# Patient Record
Sex: Male | Born: 1950
Health system: Southern US, Community
[De-identification: ages and names within clinical notes are randomized; demographics above are authoritative.]

## PROBLEM LIST (undated history)

## (undated) DIAGNOSIS — F17200 Nicotine dependence, unspecified, uncomplicated: Secondary | ICD-10-CM

## (undated) DIAGNOSIS — J449 Chronic obstructive pulmonary disease, unspecified: Secondary | ICD-10-CM

## (undated) DIAGNOSIS — I2510 Atherosclerotic heart disease of native coronary artery without angina pectoris: Secondary | ICD-10-CM

## (undated) DIAGNOSIS — D126 Benign neoplasm of colon, unspecified: Secondary | ICD-10-CM

## (undated) DIAGNOSIS — I10 Essential (primary) hypertension: Secondary | ICD-10-CM

## (undated) DIAGNOSIS — K439 Ventral hernia without obstruction or gangrene: Secondary | ICD-10-CM

## (undated) DIAGNOSIS — E669 Obesity, unspecified: Secondary | ICD-10-CM

## (undated) DIAGNOSIS — E785 Hyperlipidemia, unspecified: Secondary | ICD-10-CM

## (undated) DIAGNOSIS — R0600 Dyspnea, unspecified: Secondary | ICD-10-CM

## (undated) DIAGNOSIS — C801 Malignant (primary) neoplasm, unspecified: Secondary | ICD-10-CM

## (undated) DIAGNOSIS — T8859XA Other complications of anesthesia, initial encounter: Secondary | ICD-10-CM

## (undated) DIAGNOSIS — K4090 Unilateral inguinal hernia, without obstruction or gangrene, not specified as recurrent: Secondary | ICD-10-CM

## (undated) DIAGNOSIS — Z8719 Personal history of other diseases of the digestive system: Secondary | ICD-10-CM

## (undated) DIAGNOSIS — T884XXA Failed or difficult intubation, initial encounter: Secondary | ICD-10-CM

## (undated) DIAGNOSIS — R7303 Prediabetes: Secondary | ICD-10-CM

## (undated) HISTORY — PX: EYE SURGERY: SHX253

## (undated) HISTORY — PX: FINGER SURGERY: SHX640

## (undated) HISTORY — PX: CARPAL TUNNEL RELEASE: SHX101

## (undated) HISTORY — DX: Hyperlipidemia, unspecified: E78.5

## (undated) HISTORY — DX: Nicotine dependence, unspecified, uncomplicated: F17.200

## (undated) HISTORY — PX: STRABISMUS SURGERY: SHX218

## (undated) HISTORY — DX: Personal history of other diseases of the digestive system: Z87.19

## (undated) HISTORY — DX: Benign neoplasm of colon, unspecified: D12.6

## (undated) HISTORY — DX: Obesity, unspecified: E66.9

---
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
## 2006-06-09 ENCOUNTER — Ambulatory Visit: Payer: Self-pay | Admitting: Cardiovascular Disease

## 2006-08-18 ENCOUNTER — Emergency Department (HOSPITAL_COMMUNITY): Admission: EM | Admit: 2006-08-18 | Discharge: 2006-08-18 | Payer: Self-pay | Admitting: Family Medicine

## 2006-08-22 ENCOUNTER — Encounter: Admission: RE | Admit: 2006-08-22 | Discharge: 2006-08-22 | Payer: Self-pay | Admitting: Family Medicine

## 2008-03-29 ENCOUNTER — Emergency Department (HOSPITAL_COMMUNITY): Admission: EM | Admit: 2008-03-29 | Discharge: 2008-03-29 | Payer: Self-pay | Admitting: Emergency Medicine

## 2008-06-09 ENCOUNTER — Emergency Department (HOSPITAL_COMMUNITY): Admission: EM | Admit: 2008-06-09 | Discharge: 2008-06-09 | Payer: Self-pay | Admitting: Family Medicine

## 2008-09-27 HISTORY — PX: COLONOSCOPY: SHX174

## 2009-05-12 ENCOUNTER — Emergency Department (HOSPITAL_COMMUNITY): Admission: EM | Admit: 2009-05-12 | Discharge: 2009-05-12 | Payer: Self-pay | Admitting: Family Medicine

## 2009-05-12 ENCOUNTER — Emergency Department (HOSPITAL_COMMUNITY): Admission: EM | Admit: 2009-05-12 | Discharge: 2009-05-12 | Payer: Self-pay | Admitting: Emergency Medicine

## 2009-05-28 DIAGNOSIS — Z8719 Personal history of other diseases of the digestive system: Secondary | ICD-10-CM

## 2009-05-28 HISTORY — DX: Personal history of other diseases of the digestive system: Z87.19

## 2011-01-02 LAB — URINALYSIS, ROUTINE W REFLEX MICROSCOPIC
Glucose, UA: NEGATIVE mg/dL
Ketones, ur: NEGATIVE mg/dL
Specific Gravity, Urine: 1.01 (ref 1.005–1.030)
Urobilinogen, UA: 1 mg/dL (ref 0.0–1.0)
pH: 6 (ref 5.0–8.0)

## 2011-01-02 LAB — DIFFERENTIAL
Basophils Absolute: 0.1 10*3/uL (ref 0.0–0.1)
Basophils Relative: 1 % (ref 0–1)
Eosinophils Relative: 2 % (ref 0–5)
Lymphs Abs: 2.1 10*3/uL (ref 0.7–4.0)
Monocytes Absolute: 1 10*3/uL (ref 0.1–1.0)
Neutro Abs: 8.9 10*3/uL — ABNORMAL HIGH (ref 1.7–7.7)
Neutrophils Relative %: 73 % (ref 43–77)

## 2011-01-02 LAB — COMPREHENSIVE METABOLIC PANEL
AST: 20 U/L (ref 0–37)
BUN: 7 mg/dL (ref 6–23)
CO2: 28 mEq/L (ref 19–32)
Calcium: 8.8 mg/dL (ref 8.4–10.5)
Creatinine, Ser: 0.9 mg/dL (ref 0.4–1.5)
Potassium: 4 mEq/L (ref 3.5–5.1)
Sodium: 137 mEq/L (ref 135–145)

## 2011-01-02 LAB — LIPASE, BLOOD: Lipase: 18 U/L (ref 11–59)

## 2011-01-02 LAB — CBC
MCHC: 34.2 g/dL (ref 30.0–36.0)
MCV: 95.7 fL (ref 78.0–100.0)
RBC: 4.91 MIL/uL (ref 4.22–5.81)
RDW: 13.9 % (ref 11.5–15.5)

## 2011-02-12 NOTE — Assessment & Plan Note (Signed)
[name]                              CARDIOLOGY OFFICE NOTE   Cameron [name]                           MRN:          [id number]  DATE:06/09/2006                            DOB:          08/31/1951    Cameron [name] is a 60 year old patient of Dr. Smith Mince.  I take care of his  wife.  He has multiple coronary risk factors, and has been having some  fatigue and exertional dyspnea.  He has been on cholesterol and hypertensive  medicines for 2 years.  There was also a question recently of diabetes.  The  patient has a positive family history of coronary disease, with several  relatives on the father's side having MIs at the age of 69.   The patient is fairly sedentary at home.  He does do a lot of activity at  work.   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   His exertional dyspnea does not particularly sound like an anginal  equivalent.  It sounds like he is out of shape and may have some early COPD.   He has gained some weight over the last couple of years as well, and this  may be reflected in his type 2 diabetes.   His previous surgery has been minimal.   He has had eye surgery in 1960 and 1961, and an appendix removed in 1972.   He does most of his smoking during the day, and smokes about a pack and a  half for the last 35 years.  He does not drink excessively.  He does have  caffeine on a daily basis.  He does not exercise outside of the activity of  work.   REVIEW OF SYSTEMS:  Benign.  He used to be seen at Urgent Care, and tells me  he has had EKGs back then.  His baseline EKG is abnormal, with nonspecific  ST-T wave changes in the inferior leads, and a nonpathological _____________  , and J point elevation in the lateral leads.   He is on:  1. Lisinopril 10 mg a day.  2. An aspirin a day.  3. Simvastatin 40 mg a day.   ALLERGIES:  He denies any allergies.   He is happily married to Cameron [name], who works at the hospital in  insurance.  I have known her for quite some time.  He has ___________  2  older daughters that are living with them, and seem to be doing well.   PHYSICAL EXAMINATION:  The weight is 240, blood pressure is 138/70, pulse is  70 and regular.  LUNGS:  Clear.  Carotids are normal.  There is an S1, S2, normal heart sounds.  ABDOMEN:  Benign.  LOWER EXTREMITIES:  Intact pulses, no edema.  He has a tattoo on his left  arm.   IMPRESSION:  The patient has multiple coronary risk factors, with some  exertional dyspnea.  I suspect this has more to do with  deconditioning and  his smoking.  However, I think given his multiple risk factors and abnormal  baseline EKG, he should have a stress Myoview.  We will arrange this later  this week.  As long as this is normal, I can see him on a yearly basis.  He  will continue to follow up with Dr. Smith Mince regarding multiple risk factor  modifications. I suspect he will need a glucose tolerance test on him, or a  hemoglobin A1C to further assess his diabetes.   Further recommendations will be based on the results of his Myoview.                               Cameron Pick. [name], MD, [facility]    PCN/MedQ  DD:  06/09/2006  DT:  06/10/2006  Job #:  [id number]

## 2011-06-30 LAB — DIFFERENTIAL
Basophils Absolute: 0.1
Basophils Relative: 0
Eosinophils Absolute: 0.1
Eosinophils Relative: 1
Lymphocytes Relative: 15
Lymphs Abs: 1.9

## 2011-06-30 LAB — POCT I-STAT, CHEM 8
BUN: 8
Chloride: 104
Potassium: 4

## 2011-06-30 LAB — CBC
RBC: 5.35
RDW: 14.2
WBC: 12.3 — ABNORMAL HIGH

## 2011-06-30 LAB — LIPASE, BLOOD: Lipase: 42

## 2011-06-30 LAB — POCT CARDIAC MARKERS
CKMB, poc: 1.8
Myoglobin, poc: 103
Troponin i, poc: <0.05

## 2011-06-30 LAB — HEPATIC FUNCTION PANEL
Bilirubin, Direct: 0.3
Indirect Bilirubin: 0.7

## 2011-12-30 ENCOUNTER — Emergency Department (INDEPENDENT_AMBULATORY_CARE_PROVIDER_SITE_OTHER)
Admission: EM | Admit: 2011-12-30 | Discharge: 2011-12-30 | Disposition: A | Discharge status: Home Health | Payer: BC Managed Care – PPO | Source: Home / Self Care | Attending: Family Medicine | Admitting: Family Medicine

## 2011-12-30 ENCOUNTER — Encounter (HOSPITAL_COMMUNITY): Payer: Self-pay

## 2011-12-30 DIAGNOSIS — R21 Rash and other nonspecific skin eruption: Secondary | ICD-10-CM

## 2011-12-30 HISTORY — DX: Essential (primary) hypertension: I10

## 2011-12-30 MED ORDER — PREDNISONE (PAK) 10 MG PO TABS: 10.0000 mg | ORAL_TABLET | Freq: Every day | ORAL | Status: AC

## 2011-12-30 MED ORDER — AMLODIPINE BESY-BENAZEPRIL HCL 10-20 MG PO CAPS: 1.0000 | ORAL_CAPSULE | Freq: Every day | ORAL | Status: DC

## 2011-12-30 NOTE — ED Notes (Signed)
Pt c/o generalized rash.  Mainly concentrated on Lower Legs.  Pt states he does have some on bilateral arms as well and on R abdomen.  Pt TX with rubbing alcohol at home with no relief.

## 2011-12-30 NOTE — Discharge Instructions (Signed)
No hot bathing. Cool as comfortable. Apply moisturizing lotion

## 2011-12-30 NOTE — ED Provider Notes (Signed)
History     CSN: [id number]  Arrival date & time 12/30/11  4098   First MD Initiated Contact with Patient 12/30/11 [phone]      Chief Complaint  Patient presents with  . Rash    (Consider location/radiation/quality/duration/timing/severity/associated sxs/prior treatment) HPI Comments: The patient reports having a rash on his legs below the knees for approx 2 wks. Itches. States he applied alcohol with not relief. No other family members with rash.   The history is provided by the patient.    Past Medical History  Diagnosis Date  . Hypertension     Past Surgical History  Procedure Date  . Eye surgery   . Appendectomy     History reviewed. No pertinent family history.  History  Substance Use Topics  . Smoking status: Current Everyday Smoker  . Smokeless tobacco: Not on file  . Alcohol Use: Yes      Review of Systems  HENT: Negative.   Eyes: Negative.   Respiratory: Negative.   Cardiovascular: Negative.   Genitourinary: Negative.   Musculoskeletal: Negative.     Allergies  Review of patient's allergies indicates no known allergies.  Home Medications   Current Outpatient Rx  Name Route Sig Dispense Refill  . ASPIRIN 81 MG PO TABS Oral Take 81 mg by mouth daily.    . OMEGA-3 FATTY ACIDS 1000 MG PO CAPS Oral Take 2 g by mouth daily.    Marland Kitchen AMLODIPINE BESY-BENAZEPRIL HCL 10-20 MG PO CAPS Oral Take 1 capsule by mouth daily.    Marland Kitchen PREDNISONE (PAK) 10 MG PO TABS Oral Take 1 tablet (10 mg total) by mouth daily. Dispense one 6 day taper Take as directed with food 21 tablet 0    BP 173/89  Pulse 78  Temp(Src) 97.8 F (36.6 C) (Oral)  Resp 14  SpO2 96%  Physical Exam  Nursing note and vitals reviewed. Constitutional: He appears well-developed and well-nourished. No distress.       States he has not had his bp med in over 3 months.   HENT:  Head: Normocephalic and atraumatic.  Nose: Nose normal.  Mouth/Throat: Oropharynx is clear and moist.  Neck: Normal  range of motion. Neck supple.  Cardiovascular: Normal rate and regular rhythm.   Pulmonary/Chest: Effort normal.  Musculoskeletal: He exhibits no edema.  Skin:       Multiple scabbed lesions on lower legs . No evidence of secondary infection. Rash most consistent with contact dermatitis    ED Course  Procedures (including critical care time)  Labs Reviewed - No data to display No results found.   1. Rash       MDM          Randa Spike, MD 12/30/11 [phone]

## 2012-02-11 ENCOUNTER — Encounter: Payer: Self-pay | Admitting: Family Medicine

## 2012-02-11 ENCOUNTER — Ambulatory Visit (INDEPENDENT_AMBULATORY_CARE_PROVIDER_SITE_OTHER): Payer: BC Managed Care – PPO | Admitting: Family Medicine

## 2012-02-11 VITALS — BP 135/80 | HR 93 | Temp 98.5°F | Wt 252.0 lb

## 2012-02-11 DIAGNOSIS — L259 Unspecified contact dermatitis, unspecified cause: Secondary | ICD-10-CM

## 2012-02-11 DIAGNOSIS — Z23 Encounter for immunization: Secondary | ICD-10-CM

## 2012-02-11 DIAGNOSIS — E785 Hyperlipidemia, unspecified: Secondary | ICD-10-CM

## 2012-02-11 DIAGNOSIS — I10 Essential (primary) hypertension: Secondary | ICD-10-CM

## 2012-02-11 DIAGNOSIS — B354 Tinea corporis: Secondary | ICD-10-CM

## 2012-02-11 MED ORDER — AMLODIPINE BESY-BENAZEPRIL HCL 10-20 MG PO CAPS: 1.0000 | ORAL_CAPSULE | Freq: Every day | ORAL | Status: DC

## 2012-02-11 MED ORDER — FLUTICASONE PROPIONATE 0.05 % EX CREA: TOPICAL_CREAM | Freq: Two times a day (BID) | CUTANEOUS | Status: DC

## 2012-02-11 MED ORDER — PREDNISONE 20 MG PO TABS: ORAL_TABLET | ORAL | Status: DC

## 2012-02-11 NOTE — Progress Notes (Signed)
Office Note 02/11/2012  CC:  Chief Complaint  Patient presents with  . Establish Care    rash on legs, previously seen in UC    HPI:  Cameron [name] is a 61 y.o. White male who is here to establish care and discuss rash on legs. Patient's most recent primary MD: Dr. Raquel James in [place]--shut down 2 yrs ago. Old records were reviewed in [facility] during today's visit.  Had onset of itchy rash about 6 wks ago on lower legs.  No trigger or contact irritant or allergen identified. He went to [facility] urgent care and was rx'd oral steroid taper x 6d and advised to use OTC hydrocortisone cream.  The rash and itching improved but didn't go away with this tx, then gradually the rash and itching have come back.  Also notes a non-itchy oval patch of rash on right anterior/lateral thigh lately x 2 wks or so. Still using hydrocortisone.  Past Medical History  Diagnosis Date  . Hypertension   . Hyperlipidemia     lovaza from prior PMD-stopped due to cost    Past Surgical History  Procedure Date  . Eye surgery   . Appendectomy 1973  . Strabismus surgery age 89 or 32    Family History  Problem Relation Age of Onset  . Heart disease Mother   . COPD Father   . Alcohol abuse Maternal Grandfather     History   Social History  . Marital Status: Married    Spouse Name: N/A    Number of Children: N/A  . Years of Education: N/A   Occupational History  . Not on file.   Social History Main Topics  . Smoking status: Current Everyday Smoker  . Smokeless tobacco: Never Used  . Alcohol Use: Yes     rare  . Drug Use: No  . Sexually Active: Not on file   Other Topics Concern  . 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  No drug use except DISTANT use of marijuana.    MEDS: ASA 81 mg qd, Lotrel 10/20 qd, OTC  fish oil tab (2 qd), OTC hydrocortisone cream prn  No Known Allergies  ROS Review of Systems  Constitutional: Negative for fever and fatigue.  HENT: Negative for congestion and sore throat.   Eyes: Negative for visual disturbance.  Respiratory: Negative for cough.   Cardiovascular: Negative for chest pain.  Gastrointestinal: Negative for nausea and abdominal pain.  Genitourinary: Negative for dysuria.  Musculoskeletal: Negative for back pain and joint swelling.  Skin: Positive for rash (as per HPI).  Neurological: Negative for weakness and headaches.  Hematological: Negative for adenopathy.    PE; Blood pressure 135/80, pulse 93, temperature 98.5 F (36.9 C), temperature source Temporal, weight 252 lb (114.306 kg), SpO2 96.00%. Gen: Alert, well appearing.  Patient is oriented to person, place, time, and situation. ENT: Eyes: no injection, icteris, swelling, or exudate.  EOMI, PERRLA. Nose: no drainage or turbinate edema/swelling.  No injection or focal lesion.  Mouth: lips without lesion/swelling.  Oral mucosa pink and moist.  Oropharynx without erythema, exudate, or swelling.  Neck - No masses or thyromegaly or limitation in range of motion CV: RRR, no m/r/g.   LUNGS: CTA bilat, nonlabored resps, good aeration in all lung fields. ABD: soft, NT, ND, BS normal.  No  hepatospenomegaly or mass.  No bruits. EXT: no clubbing, cyanosis, or edema.  SKIN: from knees down to ankles bilaterally there are scattered pinkish papules with excoriations, mild erythematous base to the papule areas.  No vesicles, no pustules, no streaking, no warmth, no tenderness.  Anterior surfaces affected more than posterior. Right anterolateral thigh with 2-3 cm irregular shaped/oval plaque with flaking/hyperkeratosis, with similar smaller lesion just above it.  Mild TTP when I press hard on this.   No fluctuance beneath this.    Pertinent labs:  None today  ASSESSMENT AND PLAN:   New pt: obtain old  records.  Contact dermatitis Unknown etiology. I'll do longer steroid taper: 40mg  qd x 5d, then 20mg  qd x 5d, then 10mg  qd x 6d, then stop. Will also give stronger steroid cream: cutivate 0.05% generic, use bid sparingly.   Tinea corporis I believe his right thigh lesions are tinea, and not related to his lower legs dermatitis. I recommended OTC lamisil bid and I'll recheck this when i see him for CPE in about 3 wks.  HTN (hypertension), benign BP good today. RF'd med today.     Return in about 3 weeks (around 03/03/2012) for o/v in 3 wks for CPE, needs fasting labs the week prior (orders are in).

## 2012-02-11 NOTE — Patient Instructions (Signed)
Buy OTC lamisil cream (generic is fine) and apply twice daily for several weeks or until the spot on your thigh is gone for 3 days.

## 2012-02-11 NOTE — Assessment & Plan Note (Signed)
I believe his right thigh lesions are tinea, and not related to his lower legs dermatitis. I recommended OTC lamisil bid and I'll recheck this when i see him for CPE in about 3 wks.

## 2012-02-11 NOTE — Assessment & Plan Note (Signed)
BP good today. RF'd med today.

## 2012-02-11 NOTE — Assessment & Plan Note (Signed)
Unknown etiology. I'll do longer steroid taper: 40mg  qd x 5d, then 20mg  qd x 5d, then 10mg  qd x 6d, then stop. Will also give stronger steroid cream: cutivate 0.05% generic, use bid sparingly.

## 2012-03-03 ENCOUNTER — Other Ambulatory Visit (INDEPENDENT_AMBULATORY_CARE_PROVIDER_SITE_OTHER): Payer: BC Managed Care – PPO

## 2012-03-03 ENCOUNTER — Other Ambulatory Visit: Payer: Self-pay | Admitting: Family Medicine

## 2012-03-03 DIAGNOSIS — E785 Hyperlipidemia, unspecified: Secondary | ICD-10-CM

## 2012-03-03 DIAGNOSIS — I10 Essential (primary) hypertension: Secondary | ICD-10-CM

## 2012-03-03 DIAGNOSIS — L259 Unspecified contact dermatitis, unspecified cause: Secondary | ICD-10-CM

## 2012-03-03 DIAGNOSIS — B354 Tinea corporis: Secondary | ICD-10-CM

## 2012-03-03 LAB — COMPREHENSIVE METABOLIC PANEL
Albumin: 3.7 g/dL (ref 3.5–5.2)
CO2: 29 mEq/L (ref 19–32)
Calcium: 8.7 mg/dL (ref 8.4–10.5)
Chloride: 104 mEq/L (ref 96–112)
Creatinine, Ser: 0.9 mg/dL (ref 0.4–1.5)
Glucose, Bld: 97 mg/dL (ref 70–99)
Potassium: 4.2 mEq/L (ref 3.5–5.1)
Total Protein: 6.5 g/dL (ref 6.0–8.3)

## 2012-03-03 LAB — LIPID PANEL
Total CHOL/HDL Ratio: 6
Triglycerides: 175 mg/dL — ABNORMAL HIGH (ref 0.0–149.0)

## 2012-03-03 LAB — CBC WITH DIFFERENTIAL/PLATELET
Basophils Relative: 0.2 % (ref 0.0–3.0)
Eosinophils Absolute: 0.1 10*3/uL (ref 0.0–0.7)
Lymphocytes Relative: 16.7 % (ref 12.0–46.0)
Lymphs Abs: 1.9 10*3/uL (ref 0.7–4.0)
MCV: 94.5 fl (ref 78.0–100.0)
Monocytes Relative: 5.2 % (ref 3.0–12.0)
Neutrophils Relative %: 76.7 % (ref 43.0–77.0)
RDW: 14.9 % — ABNORMAL HIGH (ref 11.5–14.6)
WBC: 11.1 10*3/uL — ABNORMAL HIGH (ref 4.5–10.5)

## 2012-03-03 MED ORDER — AMLODIPINE BESY-BENAZEPRIL HCL 10-20 MG PO CAPS: 1.0000 | ORAL_CAPSULE | Freq: Every day | ORAL | Status: DC

## 2012-03-03 NOTE — Telephone Encounter (Signed)
RX sent to pharmacy  

## 2012-03-10 ENCOUNTER — Encounter: Payer: BC Managed Care – PPO | Admitting: Family Medicine

## 2012-03-13 ENCOUNTER — Encounter: Payer: Self-pay | Admitting: Family Medicine

## 2012-03-13 ENCOUNTER — Ambulatory Visit (INDEPENDENT_AMBULATORY_CARE_PROVIDER_SITE_OTHER): Payer: BC Managed Care – PPO | Admitting: Family Medicine

## 2012-03-13 VITALS — BP 128/83 | HR 76 | Temp 98.0°F | Ht 70.0 in | Wt 254.0 lb

## 2012-03-13 DIAGNOSIS — I10 Essential (primary) hypertension: Secondary | ICD-10-CM

## 2012-03-13 DIAGNOSIS — Z125 Encounter for screening for malignant neoplasm of prostate: Secondary | ICD-10-CM

## 2012-03-13 DIAGNOSIS — Z0000 Encounter for general adult medical examination without abnormal findings: Secondary | ICD-10-CM

## 2012-03-13 DIAGNOSIS — E782 Mixed hyperlipidemia: Secondary | ICD-10-CM

## 2012-03-13 LAB — PSA: PSA: 1.28 ng/mL (ref 0.10–4.00)

## 2012-03-13 MED ORDER — ATORVASTATIN CALCIUM 10 MG PO TABS: 10.0000 mg | ORAL_TABLET | Freq: Every day | ORAL | Status: DC

## 2012-03-13 NOTE — Assessment & Plan Note (Addendum)
Reviewed lipid panel and discussed goals, also discussed some specific dietary changes he can make and discussed minimum exercise recommendations (20-30 min 5/7 days per week.  Lab Results  Component Value Date   CHOL 225* 03/03/2012   HDL 37.60* 03/03/2012   LDLDIRECT 159.2 03/03/2012   TRIG 175.0* 03/03/2012   CHOLHDL 6 03/03/2012   Goal LDL <409, goal trigs <150, goal HDL >40. Start atorvastatin 10mg  qd.  Recheck lipids/transaminases in 3 months.

## 2012-03-13 NOTE — Progress Notes (Signed)
Office Note 03/13/2012  CC:  Chief Complaint  Patient presents with  . Annual Exam    discuss CPE labs    HPI:  Cameron [name] is a 61 y.o. White male who is here for CPE. He has no acute complaints but would like his skin tags on his eyelids removed, referral to derm if necessary.  He does not exercise at all. He still smokes.  He admits to eating a poor diet.   Past Medical History  Diagnosis Date  . Hypertension   . Hyperlipidemia     lovaza from prior PMD-stopped due to cost  . Tobacco dependence     Past Surgical History  Procedure Date  . Eye surgery   . Appendectomy 1973  . Strabismus surgery age 52 or 67    Family History  Problem Relation Age of Onset  . Heart disease Mother   . COPD Father   . Alcohol abuse Maternal Grandfather     History   Social History  . Marital Status: Married    Spouse Name: N/A    Number of Children: N/A  . Years of Education: N/A   Occupational History  . Not on file.   Social History Main Topics  . Smoking status: Current Everyday Smoker  . Smokeless tobacco: Never Used  . Alcohol Use: Yes     rare  . Drug Use: No  . Sexually Active: Not on file   Other Topics Concern  . 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  No drug use except DISTANT use of marijuana.   MEDS: Lotrel 10/20 qd, fish oil OTC 2g daily, ASA 81mg  qd, Flonase qd  No Known Allergies  ROS Review of Systems  Constitutional: Negative for fever, chills, appetite change and fatigue.  HENT: Negative for ear pain, congestion, sore throat, neck stiffness and dental problem.   Eyes: Negative for discharge, redness and visual disturbance.       Skin tags on eyelids and under eyes for years  Respiratory: Negative for cough, chest tightness, shortness of breath and  wheezing.   Cardiovascular: Negative for chest pain, palpitations and leg swelling.  Gastrointestinal: Negative for nausea, vomiting, abdominal pain, diarrhea and blood in stool.  Genitourinary: Negative for dysuria, urgency, frequency, hematuria, flank pain and difficulty urinating.  Musculoskeletal: Negative for myalgias, back pain, joint swelling and arthralgias.  Skin: Negative for pallor and rash.  Neurological: Negative for dizziness, speech difficulty, weakness and headaches.  Hematological: Negative for adenopathy. Does not bruise/bleed easily.  Psychiatric/Behavioral: Negative for confusion and disturbed wake/sleep cycle. The patient is not nervous/anxious.     PE; Blood pressure 128/83, pulse 76, temperature 98 F (36.7 C), temperature source Temporal, height 5\' 10"  (1.778 m), weight 254 lb (115.214 kg), SpO2 96.00%. Gen: Alert, well appearing.  Patient is oriented to person, place, time, and situation. PSYCH: affect is pleasant, lucid thought and speech. ENT: Ears: EACs clear, normal epithelium.  TMs with good light reflex and landmarks bilaterally.  Eyes: no injection, icteris, swelling, or exudate.  EOMI, PERRLA.  Upper eye lids with a few pedunculated skin tags, with a few more on the infraorbital region under each eye. Nose: no drainage or turbinate edema/swelling.  No injection or focal lesion.  Mouth: lips without lesion/swelling.  Oral mucosa pink and moist.  Dentition  intact and with discoloration diffusely, several teeth have recently been extracted.  Oropharynx with mild diffuse erythema--no exudate or swelling. Neck: supple/nontender.  No LAD, mass, or TM.  Carotid pulses 2+ bilaterally, without bruits. CV: RRR, no m/r/g.   LUNGS: CTA bilat, nonlabored resps, good aeration in all lung fields. ABD: soft, NT, rotund but not distended. BS normal.  No hepatospenomegaly or mass.  No bruits. EXT: no clubbing or cyanosis.  Trace bilat edema from mid tibia level down to ankles.   DP and PT pulses 2+ bilat.   Neuro: CN 2-12 intact bilaterally, strength 5/5 in proximal and distal upper extremities and lower extremities bilaterally.  No sensory deficits.  No tremor.  No disdiadochokinesis.  No ataxia.  Upper extremity and lower extremity DTRs symmetric.  No pronator drift. Genitals normal; both testes normal without tenderness, masses, hydroceles, varicoceles, erythema or swelling. Shaft normal, circumcised, meatus normal without discharge. No inguinal hernia noted. No inguinal lymphadenopathy. Rectal exam: negative without mass, lesions or tenderness.  Prostate does not feel enlarged.  No nodule, induration, or tenderness.    Pertinent labs:  Lab Results  Component Value Date   TSH 1.08 03/03/2012   Lab Results  Component Value Date   WBC 11.1* 03/03/2012   HGB 15.9 03/03/2012   HCT 48.1 03/03/2012   MCV 94.5 03/03/2012   PLT 222.0 03/03/2012   Lab Results  Component Value Date   CREATININE 0.9 03/03/2012   BUN 9 03/03/2012   NA 138 03/03/2012   K 4.2 03/03/2012   CL 104 03/03/2012   CO2 29 03/03/2012   Lab Results  Component Value Date   ALT 13 03/03/2012   AST 12 03/03/2012   ALKPHOS 80 03/03/2012   BILITOT 0.5 03/03/2012   Lab Results  Component Value Date   CHOL 225* 03/03/2012   Lab Results  Component Value Date   HDL 37.60* 03/03/2012   No results found for this basename: LDLCALC   Lab Results  Component Value Date   TRIG 175.0* 03/03/2012   Lab Results  Component Value Date   CHOLHDL 6 03/03/2012   No results found for this basename: PSA    ASSESSMENT AND PLAN:   Health maintenance examination Reviewed age and gender appropriate health maintenance issues (prudent diet, regular exercise, health risks of tobacco and excessive alcohol, use of seatbelts, fire alarms in home, use of sunscreen).  Also reviewed age and gender appropriate health screening as well as vaccine recommendations. He is UTD on vaccines and on colon cancer screening (need to obtain [facility] GI  records). DRE normal today, will get PSA drawn today for prostate cancer screening. Encouraged smoking cessation. Discussed diet and exercise.  Hyperlipemia, mixed Reviewed lipid panel and discussed goals, also discussed some specific dietary changes he can make and discussed minimum exercise recommendations (20-30 min 5/7 days per week.  Lab Results  Component Value Date   CHOL 225* 03/03/2012   HDL 37.60* 03/03/2012   LDLDIRECT 159.2 03/03/2012   TRIG 175.0* 03/03/2012   CHOLHDL 6 03/03/2012   Goal LDL <161, goal trigs <150, goal HDL >40. Start atorvastatin 10mg  qd.  Recheck lipids/transaminases in 3 months.    FOLLOW UP:  Return in about 3 months (around 06/13/2012) for f/u hyperlipidemia and remove skin tags (30 min appt).

## 2012-03-13 NOTE — Assessment & Plan Note (Signed)
Reviewed age and gender appropriate health maintenance issues (prudent diet, regular exercise, health risks of tobacco and excessive alcohol, use of seatbelts, fire alarms in home, use of sunscreen).  Also reviewed age and gender appropriate health screening as well as vaccine recommendations. He is UTD on vaccines and on colon cancer screening (need to obtain [facility] GI records). DRE normal today, will get PSA drawn today for prostate cancer screening. Encouraged smoking cessation. Discussed diet and exercise.

## 2012-03-27 ENCOUNTER — Encounter: Payer: Self-pay | Admitting: Family Medicine

## 2012-04-20 ENCOUNTER — Other Ambulatory Visit: Payer: Self-pay | Admitting: *Deleted

## 2012-04-20 MED ORDER — AMLODIPINE BESY-BENAZEPRIL HCL 10-20 MG PO CAPS: 1.0000 | ORAL_CAPSULE | Freq: Every day | ORAL | Status: DC

## 2012-04-20 MED ORDER — ATORVASTATIN CALCIUM 10 MG PO TABS: 10.0000 mg | ORAL_TABLET | Freq: Every day | ORAL | Status: DC

## 2012-04-20 NOTE — Telephone Encounter (Signed)
Pt call requesting 90 day supply of Lotrel and lipitor.  RX sent.   Pt has follow up on 06/16/12.

## 2012-04-25 ENCOUNTER — Ambulatory Visit: Payer: BC Managed Care – PPO | Admitting: Family Medicine

## 2012-06-05 ENCOUNTER — Telehealth: Payer: Self-pay | Admitting: Family Medicine

## 2012-06-05 NOTE — Telephone Encounter (Signed)
Pt notified that report is at front desk.  Advised that TDAP is the only vaccine we have a record of.  He states this is correct and he will get other immunizations if needed from previous practices.

## 2012-06-16 ENCOUNTER — Ambulatory Visit: Payer: BC Managed Care – PPO | Admitting: Family Medicine

## 2012-08-02 ENCOUNTER — Other Ambulatory Visit: Payer: Self-pay | Admitting: Family Medicine

## 2012-08-02 NOTE — Telephone Encounter (Signed)
Patient would like his Lipitor and Lotrel sent in to [facility] ([address])

## 2012-08-03 MED ORDER — ATORVASTATIN CALCIUM 10 MG PO TABS: 10.0000 mg | ORAL_TABLET | Freq: Every day | ORAL | Status: DC

## 2012-08-03 MED ORDER — AMLODIPINE BESY-BENAZEPRIL HCL 10-20 MG PO CAPS: 1.0000 | ORAL_CAPSULE | Freq: Every day | ORAL | Status: DC

## 2012-08-03 NOTE — Telephone Encounter (Signed)
Refill request for LOTREL, LIPITOR Last filled- 04/20/12, #90 X 1 sent to [facility] Last seen-03/13/12 Follow up - 3 months, no appts made 90 day supply sent per [facility] protocol

## 2012-12-11 ENCOUNTER — Other Ambulatory Visit: Payer: Self-pay | Admitting: Family Medicine

## 2012-12-11 NOTE — Telephone Encounter (Signed)
eScribe request for refill on ATORVASTATIN Last filled - 05/2012, #90 X 0 Last seen on - 03/13/12 Follow up - 3 MONTHS 30 DAY RX sent.  Pt wife notified [facility] to fill for 30 day supply, but he is due for follow up for more refills.  She will give him message.

## 2012-12-29 ENCOUNTER — Ambulatory Visit (INDEPENDENT_AMBULATORY_CARE_PROVIDER_SITE_OTHER): Payer: 59 | Admitting: Family Medicine

## 2012-12-29 ENCOUNTER — Encounter: Payer: Self-pay | Admitting: Family Medicine

## 2012-12-29 VITALS — BP 130/74 | HR 76 | Ht 70.0 in | Wt 247.0 lb

## 2012-12-29 DIAGNOSIS — E782 Mixed hyperlipidemia: Secondary | ICD-10-CM

## 2012-12-29 DIAGNOSIS — I10 Essential (primary) hypertension: Secondary | ICD-10-CM

## 2012-12-29 DIAGNOSIS — F17200 Nicotine dependence, unspecified, uncomplicated: Secondary | ICD-10-CM

## 2012-12-29 NOTE — Assessment & Plan Note (Signed)
Problem stable.  Continue current medications and diet appropriate for this condition.  We have reviewed our general long term plan for this problem and also reviewed symptoms and signs that should prompt the patient to call or return to the office. Check lytes/cr with upcoming fasting labs.

## 2012-12-29 NOTE — Assessment & Plan Note (Signed)
He is comfortable with cutting back but has not come to the point of saying he'll quit cold [place]. Encouraged him to quit completely.

## 2012-12-29 NOTE — Progress Notes (Signed)
OFFICE NOTE  12/29/2012  CC:  Chief Complaint  Patient presents with  . Follow-up    cholesterol, HTN     HPI: Patient is a 62 y.o. Caucasian male who is here for f/u HTN, Hyperlipidemia, tob dependence.   Doing well, tolerating atorvastatin fine.  Needs f/u lipid panel. Rare bp check at pharmacy is normal. He continues to smoke but has cut back quite a bit--1 pack lasts 2d.  Tried nicotine patches/gum recently with a quit program through [place] but says it didn't work.  Tried chantix in distant past and it caused bad dreams.  He got layed off from [employer] and now works as an Medical laboratory scientific officer at [employer]. He has been eating a better diet since this job switch.  Pertinent PMH:  Past Medical History  Diagnosis Date  . Hypertension   . Hyperlipidemia     lovaza from prior PMD-stopped due to cost  . Tobacco dependence   . Adenomatous colon polyp 05/2009    Tubular adenoma, no high grade dysplasia.Marland Kitchen  + Hyperplastic polyps.  . H/O chronic gastritis 05/2009    EGD: no h.pylori,dysplasia,or evidence of malignancy   Past Surgical History  Procedure Laterality Date  . Eye surgery    . Appendectomy  1973  . Strabismus surgery  age 52 or 9    MEDS:  Outpatient Prescriptions Prior to Visit  Medication Sig Dispense Refill  . amLODipine-benazepril (LOTREL) 10-20 MG per capsule Take 1 capsule by mouth daily.  90 capsule  0  . aspirin 81 MG tablet Take 81 mg by mouth daily.      Marland Kitchen atorvastatin (LIPITOR) 10 MG tablet TAKE 1 TABLET BY MOUTH DAILY.  30 tablet  0  . fish oil-omega-3 fatty acids 1000 MG capsule Take 2 g by mouth daily.      . fluticasone (CUTIVATE) 0.05 % cream Apply topically 2 (two) times daily.  30 g  0   No facility-administered medications prior to visit.    PE: Blood pressure 130/74, pulse 76, height 5\' 10"  (1.778 m), weight 247 lb (112.038 kg). Gen: Alert, well appearing.  Patient is oriented to person, place, time, and situation. CV: RRR, no m/r/g.    LUNGS: CTA bilat, nonlabored resps, good aeration in all lung fields. EXT: no cyanosis or clubbing.  1+ pitting edema bilaterally.  IMPRESSION AND PLAN:  HTN (hypertension), benign Problem stable.  Continue current medications and diet appropriate for this condition.  We have reviewed our general long term plan for this problem and also reviewed symptoms and signs that should prompt the patient to call or return to the office. Check lytes/cr with upcoming fasting labs.  Hyperlipemia, mixed Tolerating atorvastatin fine. Needs FLP w/transaminases ASAP--ordered.  Tobacco dependence He is comfortable with cutting back but has not come to the point of saying he'll quit cold [place]. Encouraged him to quit completely.  An After Visit Summary was printed and given to the patient.  FOLLOW UP: 4-6 mo for CPE

## 2012-12-29 NOTE — Assessment & Plan Note (Signed)
Tolerating atorvastatin fine. Needs FLP w/transaminases ASAP--ordered.

## 2013-01-04 ENCOUNTER — Other Ambulatory Visit (INDEPENDENT_AMBULATORY_CARE_PROVIDER_SITE_OTHER): Payer: 59

## 2013-01-04 DIAGNOSIS — R7309 Other abnormal glucose: Secondary | ICD-10-CM

## 2013-01-04 DIAGNOSIS — I10 Essential (primary) hypertension: Secondary | ICD-10-CM

## 2013-01-04 DIAGNOSIS — E782 Mixed hyperlipidemia: Secondary | ICD-10-CM

## 2013-01-04 DIAGNOSIS — F17200 Nicotine dependence, unspecified, uncomplicated: Secondary | ICD-10-CM

## 2013-01-04 DIAGNOSIS — R739 Hyperglycemia, unspecified: Secondary | ICD-10-CM

## 2013-01-04 LAB — ALT: ALT: 13 U/L (ref 0–53)

## 2013-01-04 LAB — BASIC METABOLIC PANEL
CO2: 27 mEq/L (ref 19–32)
Calcium: 8.7 mg/dL (ref 8.4–10.5)
GFR: 86 mL/min (ref >60.00)
Potassium: 4.2 mEq/L (ref 3.5–5.1)
Sodium: 138 mEq/L (ref 135–145)

## 2013-01-04 LAB — LIPID PANEL
Cholesterol: 142 mg/dL (ref 0–200)
LDL Cholesterol: 90 mg/dL (ref 0–99)
Total CHOL/HDL Ratio: 5
Triglycerides: 115 mg/dL (ref 0.0–149.0)

## 2013-01-04 NOTE — Progress Notes (Signed)
Labs only

## 2013-01-05 NOTE — Addendum Note (Signed)
Addended by: Baldemar Lenis R on: 01/05/2013 08:05 AM   Modules accepted: Orders

## 2013-01-07 ENCOUNTER — Encounter: Payer: Self-pay | Admitting: Family Medicine

## 2013-01-07 DIAGNOSIS — R7301 Impaired fasting glucose: Secondary | ICD-10-CM | POA: Insufficient documentation

## 2013-01-09 ENCOUNTER — Telehealth: Payer: Self-pay | Admitting: *Deleted

## 2013-01-09 NOTE — Telephone Encounter (Signed)
Wife was checking on rx for amlodpine. Wife called back to let us know that rx has already been filled by pharmacy.

## 2013-02-20 ENCOUNTER — Other Ambulatory Visit: Payer: Self-pay | Admitting: Family Medicine

## 2013-02-20 NOTE — Telephone Encounter (Signed)
Rx request to pharmacy/SLS  

## 2013-04-16 ENCOUNTER — Other Ambulatory Visit: Payer: Self-pay | Admitting: Family Medicine

## 2013-06-08 ENCOUNTER — Encounter: Payer: 59 | Admitting: Family Medicine

## 2013-08-28 ENCOUNTER — Other Ambulatory Visit: Payer: Self-pay | Admitting: Family Medicine

## 2014-03-14 ENCOUNTER — Other Ambulatory Visit: Payer: Self-pay | Admitting: Family Medicine

## 2014-04-23 ENCOUNTER — Encounter: Payer: Self-pay | Admitting: Family Medicine

## 2014-04-23 ENCOUNTER — Ambulatory Visit (INDEPENDENT_AMBULATORY_CARE_PROVIDER_SITE_OTHER): Payer: 59 | Admitting: Family Medicine

## 2014-04-23 VITALS — BP 148/89 | HR 73 | Temp 98.5°F | Resp 18 | Ht 70.0 in | Wt 241.0 lb

## 2014-04-23 DIAGNOSIS — L918 Other hypertrophic disorders of the skin: Secondary | ICD-10-CM

## 2014-04-23 DIAGNOSIS — Z0389 Encounter for observation for other suspected diseases and conditions ruled out: Secondary | ICD-10-CM

## 2014-04-23 DIAGNOSIS — L909 Atrophic disorder of skin, unspecified: Secondary | ICD-10-CM

## 2014-04-23 DIAGNOSIS — E669 Obesity, unspecified: Secondary | ICD-10-CM

## 2014-04-23 DIAGNOSIS — E663 Overweight: Secondary | ICD-10-CM | POA: Insufficient documentation

## 2014-04-23 DIAGNOSIS — L919 Hypertrophic disorder of the skin, unspecified: Secondary | ICD-10-CM

## 2014-04-23 DIAGNOSIS — Z0000 Encounter for general adult medical examination without abnormal findings: Secondary | ICD-10-CM

## 2014-04-23 DIAGNOSIS — E66811 Obesity, class 1: Secondary | ICD-10-CM

## 2014-04-23 HISTORY — DX: Obesity, class 1: E66.811

## 2014-04-23 HISTORY — DX: Obesity, unspecified: E66.9

## 2014-04-23 LAB — PSA: PSA: 0.94 ng/mL (ref 0.10–4.00)

## 2014-04-23 LAB — COMPREHENSIVE METABOLIC PANEL WITH GFR
ALT: 15 U/L (ref 0–53)
AST: 19 U/L (ref 0–37)
Albumin: 4.1 g/dL (ref 3.5–5.2)
Alkaline Phosphatase: 88 U/L (ref 39–117)
BUN: 9 mg/dL (ref 6–23)
CO2: 30 meq/L (ref 19–32)
Calcium: 9.3 mg/dL (ref 8.4–10.5)
Chloride: 102 meq/L (ref 96–112)
Creatinine, Ser: 1 mg/dL (ref 0.4–1.5)
GFR: 85.63 mL/min
Glucose, Bld: 98 mg/dL (ref 70–99)
Potassium: 4 meq/L (ref 3.5–5.1)
Sodium: 139 meq/L (ref 135–145)
Total Bilirubin: 0.7 mg/dL (ref 0.2–1.2)
Total Protein: 6.8 g/dL (ref 6.0–8.3)

## 2014-04-23 LAB — CBC WITH DIFFERENTIAL/PLATELET
BASOS ABS: 0 10*3/uL (ref 0.0–0.1)
Basophils Relative: 0.4 % (ref 0.0–3.0)
EOS ABS: 0.2 10*3/uL (ref 0.0–0.7)
EOS PCT: 2.2 % (ref 0.0–5.0)
HEMATOCRIT: 50.1 % (ref 39.0–52.0)
Hemoglobin: 16.6 g/dL (ref 13.0–17.0)
LYMPHS ABS: 2.3 10*3/uL (ref 0.7–4.0)
Lymphocytes Relative: 21.4 % (ref 12.0–46.0)
MCHC: 33.2 g/dL (ref 30.0–36.0)
MCV: 94.1 fl (ref 78.0–100.0)
MONO ABS: 0.9 10*3/uL (ref 0.1–1.0)
Monocytes Relative: 8 % (ref 3.0–12.0)
NEUTROS PCT: 68 % (ref 43.0–77.0)
Neutro Abs: 7.3 10*3/uL (ref 1.4–7.7)
PLATELETS: 291 10*3/uL (ref 150.0–400.0)
RBC: 5.32 Mil/uL (ref 4.22–5.81)
RDW: 14.8 % (ref 11.5–15.5)
WBC: 10.8 10*3/uL — ABNORMAL HIGH (ref 4.0–10.5)

## 2014-04-23 LAB — LIPID PANEL
Cholesterol: 225 mg/dL — ABNORMAL HIGH (ref 0–200)
HDL: 40.2 mg/dL
LDL Cholesterol: 155 mg/dL — ABNORMAL HIGH (ref 0–99)
NonHDL: 184.8
Total CHOL/HDL Ratio: 6
Triglycerides: 147 mg/dL (ref 0.0–149.0)
VLDL: 29.4 mg/dL (ref 0.0–40.0)

## 2014-04-23 LAB — TSH: TSH: 1.57 u[IU]/mL (ref 0.35–4.50)

## 2014-04-23 MED ORDER — ATORVASTATIN CALCIUM 10 MG PO TABS
ORAL_TABLET | ORAL | Status: DC
Start: 2014-04-23 — End: 2014-04-24

## 2014-04-23 MED ORDER — AMLODIPINE BESY-BENAZEPRIL HCL 10-20 MG PO CAPS: ORAL_CAPSULE | ORAL | Status: DC

## 2014-04-23 NOTE — Progress Notes (Signed)
Pre visit review using our clinic review tool, if applicable. No additional management support is needed unless otherwise documented below in the visit note. 

## 2014-04-23 NOTE — Addendum Note (Signed)
Addended by: Ralph Dowdy on: 04/23/2014 09:34 AM   Modules accepted: Orders

## 2014-04-23 NOTE — Assessment & Plan Note (Signed)
Reviewed age and gender appropriate health maintenance issues (prudent diet, regular exercise, health risks of tobacco and excessive alcohol, use of seatbelts, fire alarms in home, use of sunscreen).  Also reviewed age and gender appropriate health screening as well as vaccine recommendations. HP labs + PSA. He'll call his GI MD at [facility] and arrange f/u colonoscopy due to hx of adenomatous polyp on TCS 5 yrs ago.

## 2014-04-23 NOTE — Progress Notes (Signed)
Office Note 04/23/2014  CC:  Chief Complaint  Patient presents with  . Annual Exam    fasting    HPI:  Cameron [name] is a 63 y.o. White male who is here for fasting annual CPE. Taking all meds, ran out of statin 3 wks ago b/c of delayed f/u (I last saw him about 15 mo ago). No acute complaints.   Still smoking 1/2-1 pack cigs per day, not contemplating quitting at this time.  Dental visit not long ago: cleaning and some repair done. Has a few skin tags on eye lids that he would like a derm referral for.   Past Medical History  Diagnosis Date  . Hypertension   . Hyperlipidemia     lovaza from prior PMD-stopped due to cost  . Tobacco dependence   . Adenomatous colon polyp 05/2009    Tubular adenoma, no high grade dysplasia.Marland Kitchen  + Hyperplastic polyps.  . H/O chronic gastritis 05/2009    EGD: no h.pylori,dysplasia,or evidence of malignancy  . Obesity, Class I, BMI 30.0-34.9 (see actual BMI) 04/23/2014    Past Surgical History  Procedure Laterality Date  . Eye surgery    . Appendectomy  1973  . Strabismus surgery  age 51 or 52    Family History  Problem Relation Age of Onset  . Heart disease Mother   . COPD Father   . Alcohol abuse Maternal Grandfather     History   Social History  . Marital Status: Married    Spouse Name: N/A    Number of Children: N/A  . Years of Education: N/A   Occupational History  . Not on file.   Social History Main Topics  . Smoking status: Current Every Day Smoker  . Smokeless tobacco: Never Used  . Alcohol Use: Yes     Comment: rare  . Drug Use: No  . Sexual Activity: Not on file   Other Topics Concern  . Not on file   Social History Narrative   Married, 2 daughters.   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   Orig from [place], has lived in [place] since 1970.   Tobacco 80 pack-yr hx, ongoing as of 12/2012.   Rare alcohol.  No drug use except DISTANT use of marijuana.    Outpatient Prescriptions Prior to Visit   Medication Sig Dispense Refill  . amLODipine-benazepril (LOTREL) 10-20 MG per capsule TAKE 1 CAPSULE BY MOUTH DAILY.  90 capsule  3  . aspirin 81 MG tablet Take 81 mg by mouth daily.      Marland Kitchen atorvastatin (LIPITOR) 10 MG tablet TAKE 1 TABLET BY MOUTH DAILY.  30 tablet  5  . atorvastatin (LIPITOR) 10 MG tablet TAKE 1 TABLET BY MOUTH DAILY.  30 tablet  0   No facility-administered medications prior to visit.    No Known Allergies  ROS Review of Systems  Constitutional: Negative for fever, chills, appetite change and fatigue.  HENT: Negative for congestion, dental problem, ear pain and sore throat.   Eyes: Negative for discharge, redness and visual disturbance.  Respiratory: Negative for cough, chest tightness, shortness of breath and wheezing.   Cardiovascular: Negative for chest pain, palpitations and leg swelling.  Gastrointestinal: Negative for nausea, vomiting, abdominal pain, diarrhea and blood in stool.  Genitourinary: Negative for dysuria, urgency, frequency, hematuria, flank pain and difficulty urinating.  Musculoskeletal: Negative for arthralgias, back pain, joint swelling, myalgias and neck stiffness.       Lower legs/feet ache at end  of day b/c he has to stand all day for his job  Skin: Negative for pallor and rash.  Neurological: Negative for dizziness, speech difficulty, weakness and headaches.  Hematological: Negative for adenopathy. Does not bruise/bleed easily.  Psychiatric/Behavioral: Positive for sleep disturbance (occ poor/inadequate sleep due to stress of wife's illness + working full time+ part time job). Negative for confusion. The patient is not nervous/anxious.     PE; Blood pressure 148/89, pulse 73, temperature 98.5 F (36.9 C), temperature source Temporal, resp. rate 18, height 5\' 10"  (1.778 m), weight 241 lb (109.317 kg), SpO2 97.00%. Gen: Alert, well appearing.  Patient is oriented to person, place, time, and situation. AFFECT: pleasant, lucid thought and  speech. ENT: Ears: EACs clear, normal epithelium.  TMs with good light reflex and landmarks bilaterally.  Eyes: no injection, icteris, swelling, or exudate.  EOMI, PERRLA. Nose: no drainage or turbinate edema/swelling.  No injection or focal lesion.  Mouth: lips without lesion/swelling.  Oral mucosa pink and moist.  Dentition intact and without obvious caries or gingival swelling.  Oropharynx without erythema, exudate, or swelling.  Neck: supple/nontender.  No LAD, mass, or TM.  Carotid pulses 2+ bilaterally, without bruits. CV: RRR, no m/r/g.   LUNGS: CTA bilat, nonlabored resps, good aeration in all lung fields. ABD: soft, NT, ND, BS normal.  No hepatospenomegaly or mass.  No bruits. EXT: no clubbing, cyanosis, or edema.  Musculoskeletal: no joint swelling, erythema, warmth, or tenderness.  ROM of all joints intact. Skin - no sores or suspicious lesions or rashes or color changes. Upper eyelids with 2-3 small skin tags. Rectal exam: negative without mass, lesions or tenderness, PROSTATE EXAM: smooth and symmetric without nodules or tenderness.  Prostate is rather small.  Pertinent labs:  None today  ASSESSMENT AND PLAN:   Health maintenance examination Reviewed age and gender appropriate health maintenance issues (prudent diet, regular exercise, health risks of tobacco and excessive alcohol, use of seatbelts, fire alarms in home, use of sunscreen).  Also reviewed age and gender appropriate health screening as well as vaccine recommendations. HP labs + PSA. He'll call his GI MD at [facility] and arrange f/u colonoscopy due to hx of adenomatous polyp on TCS 5 yrs ago.  Cameron After Visit Summary was printed and given to the patient.  FOLLOW UP:  Return in about 6 months (around 10/24/2014) for routine chronic illness f/u.

## 2014-04-24 ENCOUNTER — Encounter: Payer: Self-pay | Admitting: Family Medicine

## 2014-04-24 ENCOUNTER — Other Ambulatory Visit: Payer: Self-pay | Admitting: Family Medicine

## 2014-04-24 MED ORDER — ATORVASTATIN CALCIUM 20 MG PO TABS: 20.0000 mg | ORAL_TABLET | Freq: Every day | ORAL | Status: DC

## 2014-07-09 ENCOUNTER — Other Ambulatory Visit (INDEPENDENT_AMBULATORY_CARE_PROVIDER_SITE_OTHER): Payer: 59

## 2014-07-09 DIAGNOSIS — E785 Hyperlipidemia, unspecified: Secondary | ICD-10-CM

## 2014-07-09 LAB — LIPID PANEL
CHOLESTEROL: 150 mg/dL (ref 0–200)
HDL: 31.3 mg/dL — AB (ref >39.00)
LDL Cholesterol: 99 mg/dL (ref 0–99)
NONHDL: 118.7
TRIGLYCERIDES: 99 mg/dL (ref 0.0–149.0)
Total CHOL/HDL Ratio: 5
VLDL: 19.8 mg/dL (ref 0.0–40.0)

## 2014-10-23 ENCOUNTER — Ambulatory Visit (INDEPENDENT_AMBULATORY_CARE_PROVIDER_SITE_OTHER): Payer: 59 | Admitting: Family Medicine

## 2014-10-23 ENCOUNTER — Encounter: Payer: Self-pay | Admitting: Family Medicine

## 2014-10-23 VITALS — BP 129/79 | HR 75 | Temp 98.5°F | Resp 18 | Ht 70.0 in | Wt 243.0 lb

## 2014-10-23 DIAGNOSIS — E785 Hyperlipidemia, unspecified: Secondary | ICD-10-CM

## 2014-10-23 DIAGNOSIS — F17200 Nicotine dependence, unspecified, uncomplicated: Secondary | ICD-10-CM

## 2014-10-23 DIAGNOSIS — I10 Essential (primary) hypertension: Secondary | ICD-10-CM

## 2014-10-23 NOTE — Progress Notes (Signed)
Pre visit review using our clinic review tool, if applicable. No additional management support is needed unless otherwise documented below in the visit note. 

## 2014-10-23 NOTE — Progress Notes (Signed)
OFFICE NOTE  10/23/2014  CC:  Chief Complaint  Patient presents with  . Follow-up   HPI: Patient is a 64 y.o. Caucasian male who is here for 6 mo f/u HTN, hyperlipidemia, and tob dependence. No bp checks but compliant with med daily, also compliant with atorvastatin as well.  No side effects. He is in process of setting up his f/u colonoscopy with [facility] for hx of adenomatous polyp 5-6 yrs ago. He still smokes, but he has cut back to 10-15 cigs per day.  He wants to continue the "slowly cutting back" method.   Pertinent PMH:  Past medical, surgical, social, and family history reviewed and no changes are noted since last office visit.  MEDS:  Outpatient Prescriptions Prior to Visit  Medication Sig Dispense Refill  . amLODipine-benazepril (LOTREL) 10-20 MG per capsule TAKE 1 CAPSULE BY MOUTH DAILY. 90 capsule 3  . aspirin 81 MG tablet Take 81 mg by mouth daily.    Marland Kitchen atorvastatin (LIPITOR) 20 MG tablet Take 1 tablet (20 mg total) by mouth daily. 90 tablet 1   No facility-administered medications prior to visit.    PE: Blood pressure 129/79, pulse 75, temperature 98.5 F (36.9 C), temperature source Temporal, resp. rate 18, height 5\' 10"  (1.778 m), weight 243 lb (110.224 kg), SpO2 97 %. Gen: Alert, well appearing.  Patient is oriented to person, place, time, and situation.   IMPRESSION AND PLAN:  1) HTN; The current medical regimen is effective;  continue present plan and medications.  2) Hyperlipidemia; The current medical regimen is effective;  continue present plan and medications.  3) Tob dependence: he will continue to slowly cut back, encouraged pt to quit completely.  An After Visit Summary was printed and given to the patient.  FOLLOW UP: 53mo for CPE with fasting labs the week prior.

## 2014-10-24 ENCOUNTER — Telehealth: Payer: Self-pay | Admitting: Family Medicine

## 2014-10-24 NOTE — Telephone Encounter (Signed)
emmi emailed °

## 2014-12-21 ENCOUNTER — Encounter: Payer: Self-pay | Admitting: Family Medicine

## 2014-12-30 ENCOUNTER — Other Ambulatory Visit: Payer: Self-pay | Admitting: Family Medicine

## 2015-01-02 ENCOUNTER — Telehealth: Payer: Self-pay | Admitting: Family Medicine

## 2015-01-02 MED ORDER — AMLODIPINE BESYLATE 10 MG PO TABS: 10.0000 mg | ORAL_TABLET | Freq: Every day | ORAL | Status: DC

## 2015-01-02 MED ORDER — BENAZEPRIL HCL 20 MG PO TABS: 20.0000 mg | ORAL_TABLET | Freq: Every day | ORAL | Status: DC

## 2015-01-02 NOTE — Telephone Encounter (Signed)
Pt's pharmacy sent request for pt's lotrel to be split into 2 separate pills, so i sent in rx for amlodipine 10mg  qd, #90, rF x 3 and benazepril 20mg  1 tab po qd, #90 RF x 3.

## 2015-04-16 ENCOUNTER — Other Ambulatory Visit (INDEPENDENT_AMBULATORY_CARE_PROVIDER_SITE_OTHER): Payer: 59

## 2015-04-16 DIAGNOSIS — Z125 Encounter for screening for malignant neoplasm of prostate: Secondary | ICD-10-CM | POA: Diagnosis not present

## 2015-04-16 DIAGNOSIS — Z0000 Encounter for general adult medical examination without abnormal findings: Secondary | ICD-10-CM

## 2015-04-16 LAB — COMPREHENSIVE METABOLIC PANEL
ALT: 14 U/L (ref 0–53)
AST: 14 U/L (ref 0–37)
Albumin: 4.1 g/dL (ref 3.5–5.2)
Alkaline Phosphatase: 92 U/L (ref 39–117)
BUN: 9 mg/dL (ref 6–23)
CALCIUM: 9.3 mg/dL (ref 8.4–10.5)
CHLORIDE: 104 meq/L (ref 96–112)
CO2: 31 mEq/L (ref 19–32)
CREATININE: 0.82 mg/dL (ref 0.40–1.50)
GFR: 101.15 mL/min (ref >60.00)
Glucose, Bld: 110 mg/dL — ABNORMAL HIGH (ref 70–99)
POTASSIUM: 4.6 meq/L (ref 3.5–5.1)
Sodium: 140 mEq/L (ref 135–145)
Total Bilirubin: 0.9 mg/dL (ref 0.2–1.2)
Total Protein: 6.4 g/dL (ref 6.0–8.3)

## 2015-04-16 LAB — CBC WITH DIFFERENTIAL/PLATELET
BASOS ABS: 0.1 10*3/uL (ref 0.0–0.1)
Basophils Relative: 0.8 % (ref 0.0–3.0)
EOS ABS: 0.2 10*3/uL (ref 0.0–0.7)
Eosinophils Relative: 2.7 % (ref 0.0–5.0)
HCT: 50.1 % (ref 39.0–52.0)
HEMOGLOBIN: 16.8 g/dL (ref 13.0–17.0)
LYMPHS ABS: 2.4 10*3/uL (ref 0.7–4.0)
LYMPHS PCT: 27 % (ref 12.0–46.0)
MCHC: 33.6 g/dL (ref 30.0–36.0)
MCV: 93.2 fl (ref 78.0–100.0)
MONOS PCT: 6.1 % (ref 3.0–12.0)
Monocytes Absolute: 0.6 10*3/uL (ref 0.1–1.0)
NEUTROS ABS: 5.8 10*3/uL (ref 1.4–7.7)
NEUTROS PCT: 63.4 % (ref 43.0–77.0)
Platelets: 258 10*3/uL (ref 150.0–400.0)
RBC: 5.37 Mil/uL (ref 4.22–5.81)
RDW: 14 % (ref 11.5–15.5)
WBC: 9.1 10*3/uL (ref 4.0–10.5)

## 2015-04-16 LAB — LIPID PANEL
CHOL/HDL RATIO: 4
CHOLESTEROL: 163 mg/dL (ref 0–200)
HDL: 38.3 mg/dL — AB (ref >39.00)
LDL CALC: 91 mg/dL (ref 0–99)
NONHDL: 124.7
TRIGLYCERIDES: 167 mg/dL — AB (ref 0.0–149.0)
VLDL: 33.4 mg/dL (ref 0.0–40.0)

## 2015-04-16 LAB — PSA: PSA: 0.61 ng/mL (ref 0.10–4.00)

## 2015-04-16 LAB — TSH: TSH: 1.82 u[IU]/mL (ref 0.35–4.50)

## 2015-04-17 ENCOUNTER — Other Ambulatory Visit: Payer: Self-pay | Admitting: Family Medicine

## 2015-04-17 ENCOUNTER — Other Ambulatory Visit (INDEPENDENT_AMBULATORY_CARE_PROVIDER_SITE_OTHER): Payer: 59

## 2015-04-17 DIAGNOSIS — R7301 Impaired fasting glucose: Secondary | ICD-10-CM

## 2015-04-17 LAB — HEMOGLOBIN A1C: Hgb A1c MFr Bld: 5.8 % (ref 4.6–6.5)

## 2015-04-23 ENCOUNTER — Ambulatory Visit (INDEPENDENT_AMBULATORY_CARE_PROVIDER_SITE_OTHER): Payer: 59 | Admitting: Family Medicine

## 2015-04-23 ENCOUNTER — Encounter: Payer: Self-pay | Admitting: Family Medicine

## 2015-04-23 VITALS — BP 140/82 | HR 60 | Temp 97.4°F | Resp 16 | Ht 70.0 in | Wt 242.0 lb

## 2015-04-23 DIAGNOSIS — Z0000 Encounter for general adult medical examination without abnormal findings: Secondary | ICD-10-CM | POA: Diagnosis not present

## 2015-04-23 NOTE — Progress Notes (Signed)
Pre visit review using our clinic review tool, if applicable. No additional management support is needed unless otherwise documented below in the visit note. 

## 2015-04-23 NOTE — Progress Notes (Signed)
Office Note 04/23/2015  CC:  Chief Complaint  Patient presents with  . Annual Exam    Pt is not fasting.     HPI:  Cameron [name] is a 64 y.o. White male who is here for annual health maintenance exam. Still smoking quite a bit. Stress of wife's illness is larger lately.  No acute complaints.  Past Medical History  Diagnosis Date  . Hypertension   . Hyperlipidemia     lovaza from prior PMD-stopped due to cost  . Tobacco dependence   . Adenomatous colon polyp 05/2009; 11/2014    2010:Tubular adenoma, no high grade dysplasia.Marland Kitchen  + Hyperplastic polyps. 2016: Tubular adenoma-rpt 5 yrs.  . H/O chronic gastritis 05/2009    EGD: no h.pylori,dysplasia,or evidence of malignancy  . Obesity, Class I, BMI 30.0-34.9 (see actual BMI) 04/23/2014    Past Surgical History  Procedure Laterality Date  . Eye surgery    . Appendectomy  1973  . Strabismus surgery  age 25 or 53  . Colonoscopy  2010    [facility]    Family History  Problem Relation Age of Onset  . Heart disease Mother   . COPD Father   . Alcohol abuse Maternal Grandfather     History   Social History  . Marital Status: Married    Spouse Name: N/A  . Number of Children: N/A  . Years of Education: N/A   Occupational History  . Not on file.   Social History Main Topics  . Smoking status: Current Every Day Smoker  . Smokeless tobacco: Never Used  . Alcohol Use: Yes     Comment: rare  . Drug Use: No  . Sexual Activity: Not on file   Other Topics Concern  . Not on file   Social History Narrative   Married, 2 daughters.   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   Orig from [place], has lived in [place] since 1970.   Tobacco 80 pack-yr hx, ongoing as of 03/2014.   Rare alcohol.  No drug use except DISTANT use of marijuana.    Outpatient Prescriptions Prior to Visit  Medication Sig Dispense Refill  . amLODipine (NORVASC) 10 MG tablet Take 1 tablet (10 mg total) by mouth daily. 90 tablet 3  . aspirin 81 MG  tablet Take 81 mg by mouth daily.    Marland Kitchen atorvastatin (LIPITOR) 20 MG tablet TAKE 1 TABLET BY MOUTH DAILY. 90 tablet 3  . benazepril (LOTENSIN) 20 MG tablet Take 1 tablet (20 mg total) by mouth daily. 90 tablet 3   No facility-administered medications prior to visit.    No Known Allergies  ROS Review of Systems  Constitutional: Negative for fever, chills, appetite change and fatigue.  HENT: Negative for congestion, dental problem, ear pain and sore throat.   Eyes: Negative for discharge, redness and visual disturbance.  Respiratory: Negative for cough, chest tightness, shortness of breath and wheezing.   Cardiovascular: Negative for chest pain, palpitations and leg swelling.  Gastrointestinal: Negative for nausea, vomiting, abdominal pain, diarrhea and blood in stool.  Genitourinary: Negative for dysuria, urgency, frequency, hematuria, flank pain and difficulty urinating.  Musculoskeletal: Negative for myalgias, back pain, joint swelling, arthralgias and neck stiffness.  Skin: Negative for pallor and rash.  Neurological: Negative for dizziness, speech difficulty, weakness and headaches.  Hematological: Negative for adenopathy. Does not bruise/bleed easily.  Psychiatric/Behavioral: Negative for confusion and sleep disturbance. The patient is not nervous/anxious.     PE; Blood pressure  140/82, pulse 60, temperature 97.4 F (36.3 C), temperature source Oral, resp. rate 16, height 5\' 10"  (1.778 m), weight 242 lb (109.77 kg), SpO2 98 %. Repeat manual bp today was 140/82 Gen: Alert, well appearing.  Patient is oriented to person, place, time, and situation. AFFECT: pleasant, lucid thought and speech. ENT: Ears: EACs clear, normal epithelium.  TMs with good light reflex and landmarks bilaterally.  Eyes: no injection, icteris, swelling, or exudate.  EOMI, PERRLA. Nose: no drainage or turbinate edema/swelling.  No injection or focal lesion.  Mouth: lips without lesion/swelling.  Oral mucosa pink  and moist.  Dentition intact and without obvious caries or gingival swelling.  Oropharynx without erythema, exudate, or swelling.  Neck: supple/nontender.  No LAD, mass, or TM.  Carotid pulses 2+ bilaterally, without bruits. CV: RRR, no m/r/g.   LUNGS: CTA bilat, nonlabored resps, good aeration in all lung fields. ABD: rotund/obese, but soft, NT, ND, BS normal.  No hepatospenomegaly or mass.  No bruits. EXT: no clubbing, cyanosis, or edema.  Musculoskeletal: no joint swelling, erythema, warmth, or tenderness.  ROM of all joints intact. Skin - no sores or suspicious lesions or rashes or color changes Rectal exam: negative without mass, lesions or tenderness, PROSTATE EXAM: smooth and symmetric without nodules or tenderness.  Pertinent labs:  Lab Results  Component Value Date   TSH 1.82 04/16/2015   Lab Results  Component Value Date   WBC 9.1 04/16/2015   HGB 16.8 04/16/2015   HCT 50.1 04/16/2015   MCV 93.2 04/16/2015   PLT 258.0 04/16/2015   Lab Results  Component Value Date   CREATININE 0.82 04/16/2015   BUN 9 04/16/2015   NA 140 04/16/2015   K 4.6 04/16/2015   CL 104 04/16/2015   CO2 31 04/16/2015   Lab Results  Component Value Date   ALT 14 04/16/2015   AST 14 04/16/2015   ALKPHOS 92 04/16/2015   BILITOT 0.9 04/16/2015   Lab Results  Component Value Date   CHOL 163 04/16/2015   Lab Results  Component Value Date   HDL 38.30* 04/16/2015   Lab Results  Component Value Date   LDLCALC 91 04/16/2015   Lab Results  Component Value Date   TRIG 167.0* 04/16/2015   Lab Results  Component Value Date   CHOLHDL 4 04/16/2015   Lab Results  Component Value Date   PSA 0.61 04/16/2015   PSA 0.94 04/23/2014   PSA 1.28 03/13/2012    ASSESSMENT AND PLAN:   Health maintenance exam: Reviewed age and gender appropriate health maintenance issues (prudent diet, regular exercise, health risks of tobacco and excessive alcohol, use of seatbelts, fire alarms in home, use of  sunscreen).  Also reviewed age and gender appropriate health screening as well as vaccine recommendations. Prostate ca screening: DRE normal and PSA normal.  HP labs reviewed/all stable. Colon ca screening UTD. Vaccines UTD.  An After Visit Summary was printed and given to the patient.  FOLLOW UP:  Return in about 6 months (around 10/24/2015) for routine chronic illness f/u.

## 2015-10-24 ENCOUNTER — Ambulatory Visit: Payer: 59 | Admitting: Family Medicine

## 2016-09-27 HISTORY — PX: EYE SURGERY: SHX253

## 2017-02-24 ENCOUNTER — Telehealth: Payer: Self-pay | Admitting: Family Medicine

## 2017-02-24 NOTE — Telephone Encounter (Signed)
Pt would like to transfer care from Dr. Anitra Lauth to Elyn Aquas, Please advise ok to schedule.

## 2017-02-24 NOTE — Telephone Encounter (Signed)
OK with me.

## 2017-02-24 NOTE — Telephone Encounter (Signed)
Pt has been schedule with Einar Pheasant.

## 2017-02-24 NOTE — Telephone Encounter (Signed)
Ok with me 

## 2017-03-09 ENCOUNTER — Ambulatory Visit (INDEPENDENT_AMBULATORY_CARE_PROVIDER_SITE_OTHER): Payer: Self-pay | Admitting: Physician Assistant

## 2017-03-09 ENCOUNTER — Encounter: Payer: Self-pay | Admitting: Physician Assistant

## 2017-03-09 VITALS — BP 158/92 | HR 74 | Temp 98.1°F | Resp 14 | Ht 70.5 in | Wt 247.0 lb

## 2017-03-09 DIAGNOSIS — I10 Essential (primary) hypertension: Secondary | ICD-10-CM

## 2017-03-09 DIAGNOSIS — E782 Mixed hyperlipidemia: Secondary | ICD-10-CM

## 2017-03-09 DIAGNOSIS — F17200 Nicotine dependence, unspecified, uncomplicated: Secondary | ICD-10-CM

## 2017-03-09 LAB — LIPID PANEL
CHOL/HDL RATIO: 6
Cholesterol: 233 mg/dL — ABNORMAL HIGH (ref 0–200)
HDL: 39.6 mg/dL (ref >39.00)
NonHDL: 193.81
Triglycerides: 226 mg/dL — ABNORMAL HIGH (ref 0.0–149.0)
VLDL: 45.2 mg/dL — ABNORMAL HIGH (ref 0.0–40.0)

## 2017-03-09 LAB — COMPREHENSIVE METABOLIC PANEL
ALK PHOS: 89 U/L (ref 39–117)
ALT: 15 U/L (ref 0–53)
AST: 13 U/L (ref 0–37)
Albumin: 4.4 g/dL (ref 3.5–5.2)
BUN: 11 mg/dL (ref 6–23)
CO2: 32 mEq/L (ref 19–32)
Calcium: 9.7 mg/dL (ref 8.4–10.5)
Chloride: 103 mEq/L (ref 96–112)
Creatinine, Ser: 0.87 mg/dL (ref 0.40–1.50)
GFR: 93.9 mL/min (ref >60.00)
GLUCOSE: 98 mg/dL (ref 70–99)
Potassium: 4.9 mEq/L (ref 3.5–5.1)
SODIUM: 138 meq/L (ref 135–145)
TOTAL PROTEIN: 7 g/dL (ref 6.0–8.3)
Total Bilirubin: 0.5 mg/dL (ref 0.2–1.2)

## 2017-03-09 LAB — LDL CHOLESTEROL, DIRECT: LDL DIRECT: 155 mg/dL

## 2017-03-09 MED ORDER — BENAZEPRIL HCL 20 MG PO TABS: 20.0000 mg | ORAL_TABLET | Freq: Every day | ORAL | 1 refills | Status: DC

## 2017-03-09 NOTE — Assessment & Plan Note (Signed)
Repeat CMP and lipids today. Will restart medication. Continue 81 mg ASA daily.

## 2017-03-09 NOTE — Progress Notes (Signed)
Patient presents to clinic today to transfer care from Dr. Anitra Lauth at our [facility]. Patient has not been seen within [place] since 2016. This is patient's first visit with me.   Acute Concerns: Denies acute concerns at today's visit.   Chronic Issues: Hypertension -- Previously on a regimen of amlodipine 10 mg and Benazepril 20 mg daily. Has been out of medication for over a year. Patient denies chest pain, palpitations, lightheadedness, dizziness, vision changes or frequent headaches. Denies history of MI or CVA. Endorses stress test previously in 2008. Norma per patient. No records for review. Patient without insurance so cost is an issue with medications.   BP Readings from Last 3 Encounters:  03/09/17 (!) 158/92  04/23/15 140/82  10/23/14 129/79   Hyperlipidemia -- Patient previously on regimen of Lipitor 20 mg daily. Has been out of medications for over a year. Is taking 81 mg ASA daily.   Tobacco Use Disorder -- Patient is a 50 pack-year smoker. Denies known history of COPD. Denies shortness of breath. Does not wife complains of occasional wheezing.  Has never had lung cancer screening before. Denies ever seeing Pulmonology. Has tried Chantix previously with side effects. Has also tried patches previously with some success. Is not ready to quit at present.   Health Maintenance: Immunizations -- TDaP up-to-date.  Colonoscopy -- Last 12/06/2014 - benign polyps. Recall 5 years.   Past Medical History:  Diagnosis Date  . Adenomatous colon polyp 05/2009; 11/2014   2010:Tubular adenoma, no high grade dysplasia.Marland Kitchen  + Hyperplastic polyps. 2016: Tubular adenoma-rpt 5 yrs.  . H/O chronic gastritis 05/2009   EGD: no h.pylori,dysplasia,or evidence of malignancy  . Hyperlipidemia    lovaza from prior PMD-stopped due to cost  . Hypertension   . Obesity, Class I, BMI 30.0-34.9 (see actual BMI) 04/23/2014  . Tobacco dependence     Past Surgical History:  Procedure Laterality Date  .  APPENDECTOMY  1973  . COLONOSCOPY  2010   [facility]  . EYE SURGERY    . STRABISMUS SURGERY  age 66 or 9    Current Outpatient Prescriptions on File Prior to Visit  Medication Sig Dispense Refill  . aspirin 81 MG tablet Take 81 mg by mouth daily.     No current facility-administered medications on file prior to visit.     No Known Allergies  Family History  Problem Relation Age of Onset  . Heart disease Mother   . COPD Father   . Hypertension Father   . Hyperlipidemia Father   . Diabetes Father   . Heart attack Father   . Dementia Father   . Alcohol abuse Maternal Grandfather     Social History   Social History  . Marital status: Widowed    Spouse name: N/A  . Number of children: N/A  . Years of education: N/A   Occupational History  . Retired    Social History Main Topics  . Smoking status: Current Every Day Smoker    Packs/day: 1.00    Years: 40.00    Types: Cigarettes  . Smokeless tobacco: Never Used  . Alcohol use Yes     Comment: rare  . Drug use: No  . Sexual activity: Not on file   Other Topics Concern  . Not on file   Social History Narrative   Married, 2 daughters.   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   Orig from [place], has lived in [place] since 1970.   Tobacco  80 pack-yr hx, ongoing as of 03/2014.   Rare alcohol.  No drug use except DISTANT use of marijuana.   Review of Systems  Constitutional: Negative for fever and malaise/fatigue.  Respiratory: Negative for cough, hemoptysis, sputum production, shortness of breath and wheezing.   Cardiovascular: Negative for chest pain and palpitations.  Gastrointestinal: Negative for heartburn and nausea.  Neurological: Negative for dizziness, loss of consciousness and headaches.  Psychiatric/Behavioral: Negative for depression, hallucinations, substance abuse and suicidal ideas. The patient is not nervous/anxious.    BP (!) 158/92   Pulse 74   Temp 98.1 F (36.7 C) (Oral)   Resp 14    Ht 5' 10.5" (1.791 m)   Wt 247 lb (112 kg)   SpO2 98%   BMI 34.94 kg/m   Physical Exam  Constitutional: He is oriented to person, place, and time and well-developed, well-nourished, and in no distress.  HENT:  Head: Normocephalic and atraumatic.  Eyes: Conjunctivae are normal.  Neck: Neck supple.  Cardiovascular: Normal rate, regular rhythm, normal heart sounds and intact distal pulses.   Pulmonary/Chest: Effort normal and breath sounds normal. No respiratory distress. He has no wheezes. He has no rales. He exhibits no tenderness.  Neurological: He is alert and oriented to person, place, and time.  Skin: Skin is warm and dry. No rash noted.  Psychiatric: Affect normal.  Vitals reviewed.  Assessment/Plan: Tobacco dependence Discussed need for cessation. Patient is not ready at present. Handout given. Discussed calling office when he is ready to consider starting something. Once Medicare coverage is present, will need low-dose CT for lung cancer screening.  Hyperlipemia, mixed Repeat CMP and lipids today. Will restart medication. Continue 81 mg ASA daily.   HTN (hypertension), benign No medication in over a year. Asymptomatic. Restart Benazepril 20 mg daily. DASH diet reviewed. Labs today. Follow-up 2-3 weeks.     Leeanne Rio, PA-C

## 2017-03-09 NOTE — Patient Instructions (Signed)
Please go to the lab for blood work. I will call with results.  Continue the baby aspirin (81 mg) daily. Start the Benazepril and take daily as directed.   Follow the diet below. Follow-up with me in 2 weeks for reassessment.    DASH Eating Plan DASH stands for "Dietary Approaches to Stop Hypertension." The DASH eating plan is a healthy eating plan that has been shown to reduce high blood pressure (hypertension). It may also reduce your risk for type 2 diabetes, heart disease, and stroke. The DASH eating plan may also help with weight loss. What are tips for following this plan? General guidelines  Avoid eating more than 2,300 mg (milligrams) of salt (sodium) a day. If you have hypertension, you may need to reduce your sodium intake to 1,500 mg a day.  Limit alcohol intake to no more than 1 drink a day for nonpregnant women and 2 drinks a day for men. One drink equals 12 oz of beer, 5 oz of wine, or 1 oz of hard liquor.  Work with your health care provider to maintain a healthy body weight or to lose weight. Ask what an ideal weight is for you.  Get at least 30 minutes of exercise that causes your heart to beat faster (aerobic exercise) most days of the week. Activities may include walking, swimming, or biking.  Work with your health care provider or diet and nutrition specialist (dietitian) to adjust your eating plan to your individual calorie needs. Reading food labels  Check food labels for the amount of sodium per serving. Choose foods with less than 5 percent of the Daily Value of sodium. Generally, foods with less than 300 mg of sodium per serving fit into this eating plan.  To find whole grains, look for the word "whole" as the first word in the ingredient list. Shopping  Buy products labeled as "low-sodium" or "no salt added."  Buy fresh foods. Avoid canned foods and premade or frozen meals. Cooking  Avoid adding salt when cooking. Use salt-free seasonings or herbs instead  of table salt or sea salt. Check with your health care provider or pharmacist before using salt substitutes.  Do not fry foods. Cook foods using healthy methods such as baking, boiling, grilling, and broiling instead.  Cook with heart-healthy oils, such as olive, canola, soybean, or sunflower oil. Meal planning   Eat a balanced diet that includes: ? 5 or more servings of fruits and vegetables each day. At each meal, try to fill half of your plate with fruits and vegetables. ? Up to 6-8 servings of whole grains each day. ? Less than 6 oz of lean meat, poultry, or fish each day. A 3-oz serving of meat is about the same size as a deck of cards. One egg equals 1 oz. ? 2 servings of low-fat dairy each day. ? A serving of nuts, seeds, or beans 5 times each week. ? Heart-healthy fats. Healthy fats called Omega-3 fatty acids are found in foods such as flaxseeds and coldwater fish, like sardines, salmon, and mackerel.  Limit how much you eat of the following: ? Canned or prepackaged foods. ? Food that is high in trans fat, such as fried foods. ? Food that is high in saturated fat, such as fatty meat. ? Sweets, desserts, sugary drinks, and other foods with added sugar. ? Full-fat dairy products.  Do not salt foods before eating.  Try to eat at least 2 vegetarian meals each week.  Eat more home-cooked food  and less restaurant, buffet, and fast food.  When eating at a restaurant, ask that your food be prepared with less salt or no salt, if possible. What foods are recommended? The items listed may not be a complete list. Talk with your dietitian about what dietary choices are best for you. Grains Whole-grain or whole-wheat bread. Whole-grain or whole-wheat pasta. Brown rice. [place]. Bulgur. Whole-grain and low-sodium cereals. Pita bread. Low-fat, low-sodium crackers. Whole-wheat flour tortillas. Vegetables Fresh or frozen vegetables (raw, steamed, roasted, or grilled). Low-sodium or  reduced-sodium tomato and vegetable juice. Low-sodium or reduced-sodium tomato sauce and tomato paste. Low-sodium or reduced-sodium canned vegetables. Fruits All fresh, dried, or frozen fruit. Canned fruit in natural juice (without added sugar). Meat and other protein foods Skinless chicken or [place]. Ground chicken or [place]. Pork with fat trimmed off. Fish and seafood. Egg whites. Dried beans, peas, or lentils. Unsalted nuts, nut butters, and seeds. Unsalted canned beans. Lean cuts of beef with fat trimmed off. Low-sodium, lean deli meat. Dairy Low-fat (1%) or fat-free (skim) milk. Fat-free, low-fat, or reduced-fat cheeses. Nonfat, low-sodium ricotta or cottage cheese. Low-fat or nonfat yogurt. Low-fat, low-sodium cheese. Fats and oils Soft margarine without trans fats. Vegetable oil. Low-fat, reduced-fat, or light mayonnaise and salad dressings (reduced-sodium). Canola, safflower, olive, soybean, and sunflower oils. Avocado. Seasoning and other foods Herbs. Spices. Seasoning mixes without salt. Unsalted popcorn and pretzels. Fat-free sweets. What foods are not recommended? The items listed may not be a complete list. Talk with your dietitian about what dietary choices are best for you. Grains Baked goods made with fat, such as croissants, muffins, or some breads. Dry pasta or rice meal packs. Vegetables Creamed or fried vegetables. Vegetables in a cheese sauce. Regular canned vegetables (not low-sodium or reduced-sodium). Regular canned tomato sauce and paste (not low-sodium or reduced-sodium). Regular tomato and vegetable juice (not low-sodium or reduced-sodium). Angie Fava. Olives. Fruits Canned fruit in a light or heavy syrup. Fried fruit. Fruit in cream or butter sauce. Meat and other protein foods Fatty cuts of meat. Ribs. Fried meat. Berniece Salines. Sausage. Bologna and other processed lunch meats. Salami. Fatback. Hotdogs. Bratwurst. Salted nuts and seeds. Canned beans with added salt. Canned or  smoked fish. Whole eggs or egg yolks. Chicken or [place] with skin. Dairy Whole or 2% milk, cream, and half-and-half. Whole or full-fat cream cheese. Whole-fat or sweetened yogurt. Full-fat cheese. Nondairy creamers. Whipped toppings. Processed cheese and cheese spreads. Fats and oils Butter. Stick margarine. Lard. Shortening. Ghee. Bacon fat. Tropical oils, such as coconut, palm kernel, or palm oil. Seasoning and other foods Salted popcorn and pretzels. Onion salt, garlic salt, seasoned salt, table salt, and sea salt. Worcestershire sauce. Tartar sauce. Barbecue sauce. Teriyaki sauce. Soy sauce, including reduced-sodium. Steak sauce. Canned and packaged gravies. Fish sauce. Oyster sauce. Cocktail sauce. Horseradish that you find on the shelf. Ketchup. Mustard. Meat flavorings and tenderizers. Bouillon cubes. Hot sauce and Tabasco sauce. Premade or packaged marinades. Premade or packaged taco seasonings. Relishes. Regular salad dressings. Where to find more information:  National Heart, Lung, and [place]: https://wilson-eaton.com/  American Heart Association: www.heart.org Summary  The DASH eating plan is a healthy eating plan that has been shown to reduce high blood pressure (hypertension). It may also reduce your risk for type 2 diabetes, heart disease, and stroke.  With the DASH eating plan, you should limit salt (sodium) intake to 2,300 mg a [place]. If you have hypertension, you may need to reduce your sodium intake to 1,500 mg a  day.  When on the DASH eating plan, aim to eat more fresh fruits and vegetables, whole grains, lean proteins, low-fat dairy, and heart-healthy fats.  Work with your health care provider or diet and nutrition specialist (dietitian) to adjust your eating plan to your individual calorie needs. This information is not intended to replace advice given to you by your health care provider. Make sure you discuss any questions you have with your health care provider. Document  Released: 09/02/2011 Document Revised: 09/06/2016 Document Reviewed: 09/06/2016 Elsevier Interactive Patient Education  2017 [employer].

## 2017-03-09 NOTE — Progress Notes (Signed)
Pre visit review using our clinic review tool, if applicable. No additional management support is needed unless otherwise documented below in the visit note. 

## 2017-03-09 NOTE — Assessment & Plan Note (Signed)
No medication in over a year. Asymptomatic. Restart Benazepril 20 mg daily. DASH diet reviewed. Labs today. Follow-up 2-3 weeks.

## 2017-03-09 NOTE — Assessment & Plan Note (Signed)
Discussed need for cessation. Patient is not ready at present. Handout given. Discussed calling office when he is ready to consider starting something. Once Medicare coverage is present, will need low-dose CT for lung cancer screening.

## 2017-03-11 ENCOUNTER — Other Ambulatory Visit: Payer: Self-pay | Admitting: General Practice

## 2017-03-11 DIAGNOSIS — E782 Mixed hyperlipidemia: Secondary | ICD-10-CM

## 2017-03-11 MED ORDER — ATORVASTATIN CALCIUM 20 MG PO TABS: 20.0000 mg | ORAL_TABLET | Freq: Every day | ORAL | 1 refills | Status: DC

## 2017-03-23 ENCOUNTER — Ambulatory Visit (INDEPENDENT_AMBULATORY_CARE_PROVIDER_SITE_OTHER): Payer: Self-pay | Admitting: Physician Assistant

## 2017-03-23 NOTE — Progress Notes (Signed)
Pre visit review using our clinic review tool, if applicable. No additional management support is needed unless otherwise documented below in the visit note. 

## 2017-03-23 NOTE — Progress Notes (Deleted)
Patient presents to clinic today for follow-up.  Hypertension -- ***  Hyperlipidemia -- ***  Tobacco Use Disorder -- ***  Alcohol Use Disorder -- ***  Past Medical History:  Diagnosis Date  . Adenomatous colon polyp 05/2009; 11/2014   2010:Tubular adenoma, no high grade dysplasia.Marland Kitchen  + Hyperplastic polyps. 2016: Tubular adenoma-rpt 5 yrs.  . H/O chronic gastritis 05/2009   EGD: no h.pylori,dysplasia,or evidence of malignancy  . Hyperlipidemia    lovaza from prior PMD-stopped due to cost  . Hypertension   . Obesity, Class I, BMI 30.0-34.9 (see actual BMI) 04/23/2014  . Tobacco dependence     Current Outpatient Prescriptions on File Prior to Visit  Medication Sig Dispense Refill  . aspirin 81 MG tablet Take 81 mg by mouth daily.    Marland Kitchen atorvastatin (LIPITOR) 20 MG tablet Take 1 tablet (20 mg total) by mouth daily. 30 tablet 1  . benazepril (LOTENSIN) 20 MG tablet Take 1 tablet (20 mg total) by mouth daily. 90 tablet 1   No current facility-administered medications on file prior to visit.     No Known Allergies  Family History  Problem Relation Age of Onset  . Heart disease Mother   . COPD Father   . Hypertension Father   . Hyperlipidemia Father   . Diabetes Father   . Heart attack Father   . Dementia Father   . Alcohol abuse Maternal Grandfather     Social History   Social History  . Marital status: Widowed    Spouse name: N/A  . Number of children: N/A  . Years of education: N/A   Occupational History  . Retired    Social History Main Topics  . Smoking status: Current Every Day Smoker    Packs/day: 1.00    Years: 40.00    Types: Cigarettes  . Smokeless tobacco: Never Used  . Alcohol use Yes     Comment: rare  . Drug use: No  . Sexual activity: Not on file   Other Topics Concern  . Not on file   Social History Narrative   Married, 2 daughters.   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   Orig from [place], has lived in [place] since 1970.   Tobacco 80 pack-yr hx, ongoing as of 03/2014.   Rare alcohol.  No drug use except DISTANT use of marijuana.    Review of Systems - See HPI.  All other ROS are negative.  There were no vitals taken for this visit.  Physical Exam  Recent Results (from the past 2160 hour(s))  Comp Met (CMET)     Status: None   Collection Time: 03/09/17 11:33 AM  Result Value Ref Range   Sodium 138 135 - 145 mEq/L   Potassium 4.9 3.5 - 5.1 mEq/L   Chloride 103 96 - 112 mEq/L   CO2 32 19 - 32 mEq/L   Glucose, Bld 98 70 - 99 mg/dL   BUN 11 6 - 23 mg/dL   Creatinine, Ser 0.87 0.40 - 1.50 mg/dL   Total Bilirubin 0.5 0.2 - 1.2 mg/dL   Alkaline Phosphatase 89 39 - 117 U/L   AST 13 0 - 37 U/L   ALT 15 0 - 53 U/L   Total Protein 7.0 6.0 - 8.3 g/dL   Albumin 4.4 3.5 - 5.2 g/dL   Calcium 9.7 8.4 - 10.5 mg/dL   GFR 93.90 >60.00 mL/min  Lipid panel     Status: Abnormal   Collection Time:  03/09/17 11:33 AM  Result Value Ref Range   Cholesterol 233 (H) 0 - 200 mg/dL    Comment: ATP III Classification       Desirable:  < 200 mg/dL               Borderline High:  200 - 239 mg/dL          High:  > = 240 mg/dL   Triglycerides 226.0 (H) 0.0 - 149.0 mg/dL    Comment: Normal:  <150 mg/dLBorderline High:  150 - 199 mg/dL   HDL 39.60 >39.00 mg/dL   VLDL 45.2 (H) 0.0 - 40.0 mg/dL   Total CHOL/HDL Ratio 6     Comment:                Men          Women1/2 Average Risk     3.4          3.3Average Risk          5.0          4.42X Average Risk          9.6          7.13X Average Risk          15.0          11.0                       NonHDL 193.81     Comment: NOTE:  Non-HDL goal should be 30 mg/dL higher than patient's LDL goal (i.e. LDL goal of < 70 mg/dL, would have non-HDL goal of < 100 mg/dL)  LDL cholesterol, direct     Status: None   Collection Time: 03/09/17 11:33 AM  Result Value Ref Range   Direct LDL 155.0 mg/dL    Comment: Optimal:  <100 mg/dLNear or Above Optimal:  100-129 mg/dLBorderline High:  130-159  mg/dLHigh:  160-189 mg/dLVery High:  >190 mg/dL    Assessment/Plan: No problem-specific Assessment & Plan notes found for this encounter.    Leeanne Rio, PA-C

## 2018-02-27 ENCOUNTER — Encounter: Payer: Self-pay | Admitting: Physician Assistant

## 2018-02-27 ENCOUNTER — Other Ambulatory Visit: Payer: Self-pay

## 2018-02-27 ENCOUNTER — Ambulatory Visit (INDEPENDENT_AMBULATORY_CARE_PROVIDER_SITE_OTHER): Payer: Medicare HMO | Admitting: Physician Assistant

## 2018-02-27 VITALS — BP 152/92 | HR 76 | Temp 97.8°F | Resp 16 | Ht 70.0 in | Wt 244.4 lb

## 2018-02-27 DIAGNOSIS — Z87891 Personal history of nicotine dependence: Secondary | ICD-10-CM | POA: Diagnosis not present

## 2018-02-27 DIAGNOSIS — F17200 Nicotine dependence, unspecified, uncomplicated: Secondary | ICD-10-CM

## 2018-02-27 DIAGNOSIS — Z122 Encounter for screening for malignant neoplasm of respiratory organs: Secondary | ICD-10-CM | POA: Diagnosis not present

## 2018-02-27 DIAGNOSIS — Z0000 Encounter for general adult medical examination without abnormal findings: Secondary | ICD-10-CM | POA: Insufficient documentation

## 2018-02-27 DIAGNOSIS — E782 Mixed hyperlipidemia: Secondary | ICD-10-CM | POA: Diagnosis not present

## 2018-02-27 DIAGNOSIS — I10 Essential (primary) hypertension: Secondary | ICD-10-CM | POA: Diagnosis not present

## 2018-02-27 DIAGNOSIS — R69 Illness, unspecified: Secondary | ICD-10-CM | POA: Diagnosis not present

## 2018-02-27 LAB — COMPREHENSIVE METABOLIC PANEL
ALT: 11 U/L (ref 0–53)
AST: 11 U/L (ref 0–37)
Albumin: 4.3 g/dL (ref 3.5–5.2)
Alkaline Phosphatase: 84 U/L (ref 39–117)
BUN: 8 mg/dL (ref 6–23)
CHLORIDE: 102 meq/L (ref 96–112)
CO2: 29 mEq/L (ref 19–32)
Calcium: 9.6 mg/dL (ref 8.4–10.5)
Creatinine, Ser: 0.85 mg/dL (ref 0.40–1.50)
GFR: 96.16 mL/min (ref >60.00)
GLUCOSE: 105 mg/dL — AB (ref 70–99)
POTASSIUM: 4.5 meq/L (ref 3.5–5.1)
SODIUM: 137 meq/L (ref 135–145)
Total Bilirubin: 0.6 mg/dL (ref 0.2–1.2)
Total Protein: 6.7 g/dL (ref 6.0–8.3)

## 2018-02-27 LAB — LIPID PANEL
CHOL/HDL RATIO: 7
CHOLESTEROL: 216 mg/dL — AB (ref 0–200)
HDL: 32.6 mg/dL — ABNORMAL LOW (ref >39.00)
NonHDL: 183.58
Triglycerides: 243 mg/dL — ABNORMAL HIGH (ref 0.0–149.0)
VLDL: 48.6 mg/dL — ABNORMAL HIGH (ref 0.0–40.0)

## 2018-02-27 LAB — LDL CHOLESTEROL, DIRECT: Direct LDL: 144 mg/dL

## 2018-02-27 MED ORDER — BENAZEPRIL HCL 20 MG PO TABS: 20.0000 mg | ORAL_TABLET | Freq: Every day | ORAL | 1 refills | Status: DC

## 2018-02-27 NOTE — Assessment & Plan Note (Signed)
Off of medication. BP above goal. Asymptomatic. Improved on recheck but still elevated. Restart Benazepril 20 mg daily. DASH diet reviewed. Will check CMP today. Follow-up 4 weeks for reassessment. Will complete CPE at that time.

## 2018-02-27 NOTE — Patient Instructions (Signed)
Please stay well-hydrated and get plenty of rest.  Follow the dietary recommendation below.  Please go to the lab today for blood work.  I will call you with your results. We will alter treatment regimen(s) if indicated by your results.   Restart the Benazepril as directed. You will be contacted for a lung cancer screen. Please give some thought to letting me help you with this.    DASH Eating Plan DASH stands for "Dietary Approaches to Stop Hypertension." The DASH eating plan is a healthy eating plan that has been shown to reduce high blood pressure (hypertension). It may also reduce your risk for type 2 diabetes, heart disease, and stroke. The DASH eating plan may also help with weight loss. What are tips for following this plan? General guidelines  Avoid eating more than 2,300 mg (milligrams) of salt (sodium) a day. If you have hypertension, you may need to reduce your sodium intake to 1,500 mg a day.  Limit alcohol intake to no more than 1 drink a day for nonpregnant women and 2 drinks a day for men. One drink equals 12 oz of beer, 5 oz of wine, or 1 oz of hard liquor.  Work with your health care provider to maintain a healthy body weight or to lose weight. Ask what an ideal weight is for you.  Get at least 30 minutes of exercise that causes your heart to beat faster (aerobic exercise) most days of the week. Activities may include walking, swimming, or biking.  Work with your health care provider or diet and nutrition specialist (dietitian) to adjust your eating plan to your individual calorie needs. Reading food labels  Check food labels for the amount of sodium per serving. Choose foods with less than 5 percent of the Daily Value of sodium. Generally, foods with less than 300 mg of sodium per serving fit into this eating plan.  To find whole grains, look for the word "whole" as the first word in the ingredient list. Shopping  Buy products labeled as "low-sodium" or "no salt  added."  Buy fresh foods. Avoid canned foods and premade or frozen meals. Cooking  Avoid adding salt when cooking. Use salt-free seasonings or herbs instead of table salt or sea salt. Check with your health care provider or pharmacist before using salt substitutes.  Do not fry foods. Cook foods using healthy methods such as baking, boiling, grilling, and broiling instead.  Cook with heart-healthy oils, such as olive, canola, soybean, or sunflower oil. Meal planning   Eat a balanced diet that includes: ? 5 or more servings of fruits and vegetables each day. At each meal, try to fill half of your plate with fruits and vegetables. ? Up to 6-8 servings of whole grains each day. ? Less than 6 oz of lean meat, poultry, or fish each day. A 3-oz serving of meat is about the same size as a deck of cards. One egg equals 1 oz. ? 2 servings of low-fat dairy each day. ? A serving of nuts, seeds, or beans 5 times each week. ? Heart-healthy fats. Healthy fats called Omega-3 fatty acids are found in foods such as flaxseeds and coldwater fish, like sardines, salmon, and mackerel.  Limit how much you eat of the following: ? Canned or prepackaged foods. ? Food that is high in trans fat, such as fried foods. ? Food that is high in saturated fat, such as fatty meat. ? Sweets, desserts, sugary drinks, and other foods with added sugar. ?  Full-fat dairy products.  Do not salt foods before eating.  Try to eat at least 2 vegetarian meals each week.  Eat more home-cooked food and less restaurant, buffet, and fast food.  When eating at a restaurant, ask that your food be prepared with less salt or no salt, if possible. What foods are recommended? The items listed may not be a complete list. Talk with your dietitian about what dietary choices are best for you. Grains Whole-grain or whole-wheat bread. Whole-grain or whole-wheat pasta. Brown rice. [place]. Bulgur. Whole-grain and low-sodium cereals.  Pita bread. Low-fat, low-sodium crackers. Whole-wheat flour tortillas. Vegetables Fresh or frozen vegetables (raw, steamed, roasted, or grilled). Low-sodium or reduced-sodium tomato and vegetable juice. Low-sodium or reduced-sodium tomato sauce and tomato paste. Low-sodium or reduced-sodium canned vegetables. Fruits All fresh, dried, or frozen fruit. Canned fruit in natural juice (without added sugar). Meat and other protein foods Skinless chicken or [place]. Ground chicken or [place]. Pork with fat trimmed off. Fish and seafood. Egg whites. Dried beans, peas, or lentils. Unsalted nuts, nut butters, and seeds. Unsalted canned beans. Lean cuts of beef with fat trimmed off. Low-sodium, lean deli meat. Dairy Low-fat (1%) or fat-free (skim) milk. Fat-free, low-fat, or reduced-fat cheeses. Nonfat, low-sodium ricotta or cottage cheese. Low-fat or nonfat yogurt. Low-fat, low-sodium cheese. Fats and oils Soft margarine without trans fats. Vegetable oil. Low-fat, reduced-fat, or light mayonnaise and salad dressings (reduced-sodium). Canola, safflower, olive, soybean, and sunflower oils. Avocado. Seasoning and other foods Herbs. Spices. Seasoning mixes without salt. Unsalted popcorn and pretzels. Fat-free sweets. What foods are not recommended? The items listed may not be a complete list. Talk with your dietitian about what dietary choices are best for you. Grains Baked goods made with fat, such as croissants, muffins, or some breads. Dry pasta or rice meal packs. Vegetables Creamed or fried vegetables. Vegetables in a cheese sauce. Regular canned vegetables (not low-sodium or reduced-sodium). Regular canned tomato sauce and paste (not low-sodium or reduced-sodium). Regular tomato and vegetable juice (not low-sodium or reduced-sodium). Angie Fava. Olives. Fruits Canned fruit in a light or heavy syrup. Fried fruit. Fruit in cream or butter sauce. Meat and other protein foods Fatty cuts of meat. Ribs. Fried  meat. Berniece Salines. Sausage. Bologna and other processed lunch meats. Salami. Fatback. Hotdogs. Bratwurst. Salted nuts and seeds. Canned beans with added salt. Canned or smoked fish. Whole eggs or egg yolks. Chicken or [place] with skin. Dairy Whole or 2% milk, cream, and half-and-half. Whole or full-fat cream cheese. Whole-fat or sweetened yogurt. Full-fat cheese. Nondairy creamers. Whipped toppings. Processed cheese and cheese spreads. Fats and oils Butter. Stick margarine. Lard. Shortening. Ghee. Bacon fat. Tropical oils, such as coconut, palm kernel, or palm oil. Seasoning and other foods Salted popcorn and pretzels. Onion salt, garlic salt, seasoned salt, table salt, and sea salt. Worcestershire sauce. Tartar sauce. Barbecue sauce. Teriyaki sauce. Soy sauce, including reduced-sodium. Steak sauce. Canned and packaged gravies. Fish sauce. Oyster sauce. Cocktail sauce. Horseradish that you find on the shelf. Ketchup. Mustard. Meat flavorings and tenderizers. Bouillon cubes. Hot sauce and Tabasco sauce. Premade or packaged marinades. Premade or packaged taco seasonings. Relishes. Regular salad dressings. Where to find more information:  National Heart, Lung, and [place]: https://wilson-eaton.com/  American Heart Association: www.heart.org Summary  The DASH eating plan is a healthy eating plan that has been shown to reduce high blood pressure (hypertension). It may also reduce your risk for type 2 diabetes, heart disease, and stroke.  With the DASH eating plan, you  should limit salt (sodium) intake to 2,300 mg a day. If you have hypertension, you may need to reduce your sodium intake to 1,500 mg a day.  When on the DASH eating plan, aim to eat more fresh fruits and vegetables, whole grains, lean proteins, low-fat dairy, and heart-healthy fats.  Work with your health care provider or diet and nutrition specialist (dietitian) to adjust your eating plan to your individual calorie needs. This information is  not intended to replace advice given to you by your health care provider. Make sure you discuss any questions you have with your health care provider. Document Released: 09/02/2011 Document Revised: 09/06/2016 Document Reviewed: 09/06/2016 Elsevier Interactive Patient Education  [employer].

## 2018-02-27 NOTE — Assessment & Plan Note (Signed)
Order for lung cancer Ct (low-dose) placed.

## 2018-02-27 NOTE — Assessment & Plan Note (Signed)
Working on diet and exercise. Keep up the hard work. Will check fasting lipids and CMP today. Will restart appropriate dose of Atorvastatin and have follow-up in 4 weeks for CPE.

## 2018-02-27 NOTE — Progress Notes (Signed)
Patient presents to clinic today for follow-up of hypertension, hyperlipidemia and tobacco abuse disorder. Patient has note been seen in almost a year as he has been without insurance. Just started on [facility] 02/25/18.  Hypertension -- Patient with a long-standing history, previously on benazepril 20 mg daily. Has been out of medication for some time. Patient denies chest pain, palpitations, lightheadedness, dizziness, vision changes or frequent headaches. Is staying very active at work, doing cardio and resistance throughout the day with windedness. Notes sleeping well at night.   BP Readings from Last 3 Encounters:  02/27/18 (!) 152/92  03/09/17 (!) 158/92  04/23/15 140/82   Hyperlipidemia - Previously on Atorvastatin. Has been without medication for 11 months. If trying to work hard on diet. Has cut out carbs and fatty foods. Has increased intake of fruits and vegetables. No longer eats late at night or near bed time. Was 260 in January per patient and has lost 20 pounds with diet and exercise. Is fasting for labs today.   Tobacco Use -- Patient with a 40 pack-year smoking history. Is currently still smoking 1 ppd. Would like to discuss options for cessation but is not ready to implement a plan presently. Previously there was discussion of obtaining a CT for lung-cancer screening due to high risk, but we were wanting for his insurance. He is ok proceeding with this.   Past Medical History:  Diagnosis Date  . Adenomatous colon polyp 05/2009; 11/2014   2010:Tubular adenoma, no high grade dysplasia.Marland Kitchen  + Hyperplastic polyps. 2016: Tubular adenoma-rpt 5 yrs.  . H/O chronic gastritis 05/2009   EGD: no h.pylori,dysplasia,or evidence of malignancy  . Hyperlipidemia    lovaza from prior PMD-stopped due to cost  . Hypertension   . Obesity, Class I, BMI 30.0-34.9 (see actual BMI) 04/23/2014  . Tobacco dependence     Current Outpatient Medications on File Prior to Visit  Medication Sig  Dispense Refill  . aspirin 81 MG tablet Take 81 mg by mouth daily.    Marland Kitchen atorvastatin (LIPITOR) 20 MG tablet Take 1 tablet (20 mg total) by mouth daily. (Patient not taking: Reported on 02/27/2018) 30 tablet 1   No current facility-administered medications on file prior to visit.     No Known Allergies  Family History  Problem Relation Age of Onset  . Heart disease Mother   . COPD Father   . Hypertension Father   . Hyperlipidemia Father   . Diabetes Father   . Heart attack Father   . Dementia Father   . Alcohol abuse Maternal Grandfather     Social History   Socioeconomic History  . Marital status: Widowed    Spouse name: Not on file  . Number of children: Not on file  . Years of education: Not on file  . Highest education level: Not on file  Occupational History  . Occupation: Retired  Scientific laboratory technician  . Financial resource strain: Not on file  . Food insecurity:    Worry: Not on file    Inability: Not on file  . Transportation needs:    Medical: Not on file    Non-medical: Not on file  Tobacco Use  . Smoking status: Current Every Day Smoker    Packs/day: 1.00    Years: 40.00    Pack years: 40.00    Types: Cigarettes  . Smokeless tobacco: Never Used  Substance and Sexual Activity  . Alcohol use: Yes    Comment: rare  . Drug use:  No  . Sexual activity: Not on file  Lifestyle  . Physical activity:    Days per week: Not on file    Minutes per session: Not on file  . Stress: Not on file  Relationships  . Social connections:    Talks on phone: Not on file    Gets together: Not on file    Attends religious service: Not on file    Active member of club or organization: Not on file    Attends meetings of clubs or organizations: Not on file    Relationship status: Not on file  Other Topics Concern  . Not on file  Social History Narrative   Married, 2 daughters.   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   Orig from [place], has lived in [place] since 1970.     Tobacco 80 pack-yr hx, ongoing as of 03/2014.   Rare alcohol.  No drug use except DISTANT use of marijuana.   Review of Systems - See HPI.  All other ROS are negative.  BP (!) 152/92   Pulse 76   Temp 97.8 F (36.6 C) (Oral)   Resp 16   Ht 5\' 10"  (1.778 m)   Wt 244 lb 6.4 oz (110.9 kg)   SpO2 97%   BMI 35.07 kg/m   Physical Exam  Constitutional: He is oriented to person, place, and time. He appears well-developed and well-nourished.  HENT:  Head: Normocephalic and atraumatic.  Eyes: Conjunctivae are normal.  Neck: Neck supple. No thyromegaly present.  Cardiovascular: Normal rate, regular rhythm and normal heart sounds.  Lymphadenopathy:    He has no cervical adenopathy.  Neurological: He is alert and oriented to person, place, and time.  Psychiatric: He has a normal mood and affect.  Vitals reviewed.  Assessment/Plan: HTN (hypertension), benign Off of medication. BP above goal. Asymptomatic. Improved on recheck but still elevated. Restart Benazepril 20 mg daily. DASH diet reviewed. Will check CMP today. Follow-up 4 weeks for reassessment. Will complete CPE at that time.   Encounter for screening for malignant neoplasm of respiratory organs Order for lung cancer Ct (low-dose) placed.  Hyperlipemia, mixed Working on diet and exercise. Keep up the hard work. Will check fasting lipids and CMP today. Will restart appropriate dose of Atorvastatin and have follow-up in 4 weeks for CPE.   Tobacco dependence Cessation options reviewed. He will give some thought so we can start a plan at follow-up.     Leeanne Rio, PA-C

## 2018-02-27 NOTE — Assessment & Plan Note (Signed)
Cessation options reviewed. He will give some thought so we can start a plan at follow-up.

## 2018-02-28 ENCOUNTER — Other Ambulatory Visit: Payer: Self-pay | Admitting: Emergency Medicine

## 2018-02-28 DIAGNOSIS — E782 Mixed hyperlipidemia: Secondary | ICD-10-CM

## 2018-02-28 MED ORDER — ATORVASTATIN CALCIUM 20 MG PO TABS: 20.0000 mg | ORAL_TABLET | Freq: Every day | ORAL | 1 refills | Status: DC

## 2018-03-07 DIAGNOSIS — R69 Illness, unspecified: Secondary | ICD-10-CM | POA: Diagnosis not present

## 2018-03-15 ENCOUNTER — Ambulatory Visit (INDEPENDENT_AMBULATORY_CARE_PROVIDER_SITE_OTHER)
Admission: RE | Admit: 2018-03-15 | Discharge: 2018-03-15 | Disposition: A | Discharge status: Home / Self Care | Payer: Medicare HMO | Source: Ambulatory Visit | Attending: Physician Assistant | Admitting: Physician Assistant

## 2018-03-15 DIAGNOSIS — Z87891 Personal history of nicotine dependence: Secondary | ICD-10-CM

## 2018-03-15 DIAGNOSIS — R69 Illness, unspecified: Secondary | ICD-10-CM | POA: Diagnosis not present

## 2018-03-15 DIAGNOSIS — Z122 Encounter for screening for malignant neoplasm of respiratory organs: Secondary | ICD-10-CM

## 2018-03-16 ENCOUNTER — Other Ambulatory Visit: Payer: Self-pay

## 2018-03-16 DIAGNOSIS — I2510 Atherosclerotic heart disease of native coronary artery without angina pectoris: Secondary | ICD-10-CM

## 2018-03-16 DIAGNOSIS — K8020 Calculus of gallbladder without cholecystitis without obstruction: Secondary | ICD-10-CM

## 2018-03-16 DIAGNOSIS — Z87891 Personal history of nicotine dependence: Secondary | ICD-10-CM

## 2018-03-16 DIAGNOSIS — I6523 Occlusion and stenosis of bilateral carotid arteries: Secondary | ICD-10-CM

## 2018-03-23 DIAGNOSIS — R69 Illness, unspecified: Secondary | ICD-10-CM | POA: Diagnosis not present

## 2018-03-27 DIAGNOSIS — R69 Illness, unspecified: Secondary | ICD-10-CM | POA: Diagnosis not present

## 2018-03-28 ENCOUNTER — Ambulatory Visit (HOSPITAL_COMMUNITY)
Admission: RE | Admit: 2018-03-28 | Discharge: 2018-03-28 | Disposition: A | Discharge status: Home / Self Care | Payer: Medicare HMO | Source: Ambulatory Visit | Attending: Physician Assistant | Admitting: Physician Assistant

## 2018-03-28 DIAGNOSIS — F17210 Nicotine dependence, cigarettes, uncomplicated: Secondary | ICD-10-CM | POA: Diagnosis present

## 2018-03-28 DIAGNOSIS — J449 Chronic obstructive pulmonary disease, unspecified: Secondary | ICD-10-CM | POA: Diagnosis not present

## 2018-03-28 DIAGNOSIS — Z87891 Personal history of nicotine dependence: Secondary | ICD-10-CM

## 2018-03-28 DIAGNOSIS — R69 Illness, unspecified: Secondary | ICD-10-CM | POA: Diagnosis not present

## 2018-03-28 LAB — PULMONARY FUNCTION TEST
DL/VA % PRED: 90 %
DL/VA: 4.2 ml/min/mmHg/L
DLCO unc % pred: 62 %
DLCO unc: 20.92 ml/min/mmHg
FEF 25-75 Post: 1.76 L/sec
FEF 25-75 Pre: 1.97 L/sec
FEF2575-%CHANGE-POST: -10 %
FEF2575-%PRED-PRE: 69 %
FEF2575-%Pred-Post: 62 %
FEV1-%CHANGE-POST: -3 %
FEV1-%PRED-POST: 63 %
FEV1-%Pred-Pre: 65 %
FEV1-PRE: 2.34 L
FEV1-Post: 2.27 L
FEV1FVC-%CHANGE-POST: 2 %
FEV1FVC-%Pred-Pre: 101 %
FEV6-%CHANGE-POST: -5 %
FEV6-%Pred-Post: 64 %
FEV6-%Pred-Pre: 67 %
FEV6-PRE: 3.07 L
FEV6-Post: 2.91 L
FEV6FVC-%PRED-PRE: 105 %
FEV6FVC-%Pred-Post: 105 %
FVC-%CHANGE-POST: -5 %
FVC-%PRED-PRE: 64 %
FVC-%Pred-Post: 60 %
FVC-PRE: 3.07 L
FVC-Post: 2.91 L
POST FEV1/FVC RATIO: 78 %
Post FEV6/FVC ratio: 100 %
Pre FEV1/FVC ratio: 76 %
Pre FEV6/FVC Ratio: 100 %
RV % pred: 152 %
RV: 3.65 L
TLC % pred: 96 %
TLC: 6.96 L

## 2018-03-28 MED ORDER — ALBUTEROL SULFATE (2.5 MG/3ML) 0.083% IN NEBU
2.5000 mg | INHALATION_SOLUTION | Freq: Once | RESPIRATORY_TRACT | Status: AC
  Administered 2018-03-28: 2.5 mg via RESPIRATORY_TRACT

## 2018-03-29 ENCOUNTER — Encounter (INDEPENDENT_AMBULATORY_CARE_PROVIDER_SITE_OTHER): Payer: Self-pay

## 2018-03-29 ENCOUNTER — Telehealth: Payer: Self-pay | Admitting: Physician Assistant

## 2018-03-29 ENCOUNTER — Other Ambulatory Visit: Payer: Self-pay

## 2018-03-29 ENCOUNTER — Other Ambulatory Visit: Payer: Self-pay | Admitting: Physician Assistant

## 2018-03-29 ENCOUNTER — Ambulatory Visit: Payer: Medicare HMO

## 2018-03-29 DIAGNOSIS — I2510 Atherosclerotic heart disease of native coronary artery without angina pectoris: Secondary | ICD-10-CM

## 2018-03-29 NOTE — Telephone Encounter (Signed)
Copied from [place] [id number]. Topic: Quick Communication - See Telephone Encounter >> Mar 29, 2018  2:58 PM Neva Seat wrote: Pt's daughter is needing to speak with someone regarding pt's recent test that was done and upcoming appts. They are confused on these.

## 2018-03-29 NOTE — Telephone Encounter (Signed)
PCP spoke with patient daughter about testing and she has been reassured

## 2018-03-31 ENCOUNTER — Encounter: Payer: Self-pay | Admitting: Cardiology

## 2018-03-31 ENCOUNTER — Ambulatory Visit (INDEPENDENT_AMBULATORY_CARE_PROVIDER_SITE_OTHER): Payer: Medicare HMO | Admitting: Cardiology

## 2018-03-31 ENCOUNTER — Encounter: Payer: Self-pay | Admitting: *Deleted

## 2018-03-31 VITALS — BP 146/78 | HR 71 | Ht 71.0 in | Wt 243.4 lb

## 2018-03-31 DIAGNOSIS — E782 Mixed hyperlipidemia: Secondary | ICD-10-CM | POA: Diagnosis not present

## 2018-03-31 DIAGNOSIS — E669 Obesity, unspecified: Secondary | ICD-10-CM

## 2018-03-31 DIAGNOSIS — I10 Essential (primary) hypertension: Secondary | ICD-10-CM | POA: Diagnosis not present

## 2018-03-31 DIAGNOSIS — F17200 Nicotine dependence, unspecified, uncomplicated: Secondary | ICD-10-CM

## 2018-03-31 DIAGNOSIS — I2510 Atherosclerotic heart disease of native coronary artery without angina pectoris: Secondary | ICD-10-CM | POA: Diagnosis not present

## 2018-03-31 DIAGNOSIS — R69 Illness, unspecified: Secondary | ICD-10-CM | POA: Diagnosis not present

## 2018-03-31 NOTE — Patient Instructions (Addendum)
Medication Instructions:  Your physician recommends that you continue on your current medications as directed. Please refer to the Current Medication list given to you today.   Labwork: None  Testing/Procedures: You had an EKG today.   Your physician has requested that you have en exercise stress myoview. For further information please visit [email or web address]. Please follow instruction sheet, as given.  Your physician has requested that you have an abdominal aorta duplex. During this test, an ultrasound is used to evaluate the aorta. Allow 30 minutes for this exam. Do not eat after midnight the day before and avoid carbonated beverages  Follow-Up: Your physician wants you to follow-up in: 6 months. You will receive a reminder letter in the mail two months in advance. If you don't receive a letter, please call our office to schedule the follow-up appointment.   If you need a refill on your cardiac medications before your next appointment, please call your pharmacy.    Thank you for choosing [facility]! Robyne Peers, RN [phone]      Exercise Stress Electrocardiogram An exercise stress electrocardiogram is a test to check how blood flows to your heart. It is done to find areas of poor blood flow. You will need to walk on a treadmill for this test. The electrocardiogram will record your heartbeat when you are at rest and when you are exercising. What happens before the procedure?  Do not have drinks with caffeine or foods with caffeine for 24 hours before the test, or as told by your doctor. This includes coffee, tea (even decaf tea), sodas, chocolate, and cocoa.  Follow your doctor's instructions about eating and drinking before the test.  Ask your doctor what medicines you should or should not take before the test. Take your medicines with water unless told by your doctor not to.  If you use an inhaler, bring it with you to the test.  Bring a snack to eat  after the test.  Do not  smoke for 4 hours before the test.  Do not put lotions, powders, creams, or oils on your chest before the test.  Wear comfortable shoes and clothing. What happens during the procedure?  You will have patches put on your chest. Small areas of your chest may need to be shaved. Wires will be connected to the patches.  Your heart rate will be watched while you are resting and while you are exercising.  You will walk on the treadmill. The treadmill will slowly get faster to raise your heart rate.  The test will take about 1-2 hours. What happens after the procedure?  Your heart rate and blood pressure will be watched after the test.  You may return to your normal diet, activities, and medicines or as told by your doctor. This information is not intended to replace advice given to you by your health care provider. Make sure you discuss any questions you have with your health care provider. Document Released: 03/01/2008 Document Revised: 05/12/2016 Document Reviewed: 05/21/2013     Elsevier Interactive Patient Education  [employer].

## 2018-03-31 NOTE — Progress Notes (Signed)
Cardiology Office Note:    Date:  03/31/2018   ID:  Cameron [name], DOB Nov 10, 1950, MRN [id number]  PCP:  Brunetta Jeans, PA-C  Cardiologist:  Jenean Lindau, MD   Referring MD: Brunetta Jeans, PA-C    ASSESSMENT:    1. Coronary artery disease involving native coronary artery of native heart without angina pectoris   2. HTN (hypertension), benign   3. Hyperlipemia, mixed   4. Tobacco dependence   5. Obesity, Class I, BMI 30.0-34.9 (see actual BMI)    PLAN:    In order of problems listed above:  1. Secondary prevention stressed with the patient.  Importance of compliance with diet and medication stressed and he vocalized understanding.  His blood pressure stable.  His lipids were reviewed with him and his doctor has already started him on statins.  He also takes 81 mg of coated aspirin. 2. In view of his symptoms he will undergo exercise stress Cardiolite.  I will also do an abdominal arterial ultrasound to rule out abdominal aortic aneurysm in view of extensive smoking history. 3. Diet was discussed for dyslipidemia and obesity and risks of obesity explained and he vocalized understanding. 4. Patient will be seen in follow-up appointment in 6 months or earlier if the patient has any concerns    Medication Adjustments/Labs and Tests Ordered: Current medicines are reviewed at length with the patient today.  Concerns regarding medicines are outlined above.  No orders of the defined types were placed in this encounter.  No orders of the defined types were placed in this encounter.    History of Present Illness:    Cameron [name] is a 67 y.o. male who is being seen today for the evaluation of coronary artery calcification and a gentleman with multiple risk factors for coronary artery disease at the request of Delorse Limber.  Patient is a pleasant 67 year old male.  He has past medical history of essential hypertension and dyslipidemia.  He leads a sedentary lifestyle  and is overweight.  He smokes extensively and has done so for the past 45 years.  Patient mentions to me that he underwent CT scan of his chest and was found to have coronary artery calcification and that was the reason he was sent here for evaluation.  Again he denies any chest pain orthopnea or PND but leads a sedentary lifestyle.  At the time of my evaluation, the patient is alert awake oriented and in no distress.  Past Medical History:  Diagnosis Date  . Adenomatous colon polyp 05/2009; 11/2014   2010:Tubular adenoma, no high grade dysplasia.Marland Kitchen  + Hyperplastic polyps. 2016: Tubular adenoma-rpt 5 yrs.  . H/O chronic gastritis 05/2009   EGD: no h.pylori,dysplasia,or evidence of malignancy  . Hyperlipidemia    lovaza from prior PMD-stopped due to cost  . Hypertension   . Obesity, Class I, BMI 30.0-34.9 (see actual BMI) 04/23/2014  . Tobacco dependence     Past Surgical History:  Procedure Laterality Date  . APPENDECTOMY  1973  . COLONOSCOPY  2010   [facility]  . EYE SURGERY    . STRABISMUS SURGERY  age 21 or 9    Current Medications: Current Meds  Medication Sig  . aspirin 81 MG tablet Take 81 mg by mouth daily.  Marland Kitchen atorvastatin (LIPITOR) 20 MG tablet Take 1 tablet (20 mg total) by mouth daily.  . benazepril (LOTENSIN) 20 MG tablet Take 1 tablet (20 mg total) by mouth daily.  Allergies:   Patient has no known allergies.   Social History   Socioeconomic History  . Marital status: Widowed    Spouse name: Not on file  . Number of children: Not on file  . Years of education: Not on file  . Highest education level: Not on file  Occupational History  . Occupation: Retired  Scientific laboratory technician  . Financial resource strain: Not on file  . Food insecurity:    Worry: Not on file    Inability: Not on file  . Transportation needs:    Medical: Not on file    Non-medical: Not on file  Tobacco Use  . Smoking status: Current Every Day Smoker    Packs/day: 1.00    Years: 40.00    Pack  years: 40.00    Types: Cigarettes  . Smokeless tobacco: Never Used  Substance and Sexual Activity  . Alcohol use: Yes    Comment: rare  . Drug use: No  . Sexual activity: Not on file  Lifestyle  . Physical activity:    Days per week: Not on file    Minutes per session: Not on file  . Stress: Not on file  Relationships  . Social connections:    Talks on phone: Not on file    Gets together: Not on file    Attends religious service: Not on file    Active member of club or organization: Not on file    Attends meetings of clubs or organizations: Not on file    Relationship status: Not on file  Other Topics Concern  . Not on file  Social History Narrative   Married, 2 daughters.   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   Orig from [place], has lived in [place] since 1970.   Tobacco 80 pack-yr hx, ongoing as of 03/2014.   Rare alcohol.  No drug use except DISTANT use of marijuana.     Family History: The patient's family history includes Alcohol abuse in his maternal grandfather; COPD in his father; Dementia in his father; Diabetes in his father; Heart attack in his father; Heart disease in his mother; Hyperlipidemia in his father; Hypertension in his father.  ROS:   Please see the history of present illness.    All other systems reviewed and are negative.  EKGs/Labs/Other Studies Reviewed:    The following studies were reviewed today: EKG reveals sinus rhythm and nonspecific ST-T changes.   Recent Labs: 02/27/2018: ALT 11; BUN 8; Creatinine, Ser 0.85; Potassium 4.5; Sodium 137  Recent Lipid Panel    Component Value Date/Time   CHOL 216 (H) 02/27/2018 1116   TRIG 243.0 (H) 02/27/2018 1116   HDL 32.60 (L) 02/27/2018 1116   CHOLHDL 7 02/27/2018 1116   VLDL 48.6 (H) 02/27/2018 1116   LDLCALC 91 04/16/2015 0828   LDLDIRECT 144.0 02/27/2018 1116    Physical Exam:    VS:  BP (!) 146/78 (BP Location: Left Arm)   Pulse 71   Ht 5\' 11"  (1.803 m)   Wt 243 lb 6.4 oz  (110.4 kg)   SpO2 94%   BMI 33.95 kg/m     Wt Readings from Last 3 Encounters:  03/31/18 243 lb 6.4 oz (110.4 kg)  02/27/18 244 lb 6.4 oz (110.9 kg)  03/09/17 247 lb (112 kg)     GEN: Patient is in no acute distress HEENT: Normal NECK: No JVD; No carotid bruits LYMPHATICS: No lymphadenopathy CARDIAC: S1 S2 regular, 2/6 systolic murmur at the  apex. RESPIRATORY:  Clear to auscultation without rales, wheezing or rhonchi  ABDOMEN: Soft, non-tender, non-distended MUSCULOSKELETAL:  No edema; No deformity  SKIN: Warm and dry NEUROLOGIC:  Alert and oriented x 3 PSYCHIATRIC:  Normal affect    Signed, Jenean Lindau, MD  03/31/2018 4:35 PM    [place]  ```````````````````````````````````````````````````````````````````````````````````````````````````````````````````````````````````````````````````````````````````````````````````````````````````````````````````````````````````````````````````

## 2018-03-31 NOTE — Addendum Note (Signed)
Addended by: Austin Miles on: 03/31/2018 04:46 PM   Modules accepted: Orders

## 2018-04-03 ENCOUNTER — Other Ambulatory Visit: Payer: Self-pay | Admitting: Emergency Medicine

## 2018-04-03 ENCOUNTER — Encounter: Payer: Self-pay | Admitting: Physician Assistant

## 2018-04-03 ENCOUNTER — Telehealth: Payer: Self-pay | Admitting: Emergency Medicine

## 2018-04-03 ENCOUNTER — Ambulatory Visit: Payer: Medicare HMO | Admitting: Physician Assistant

## 2018-04-03 ENCOUNTER — Other Ambulatory Visit: Payer: Self-pay

## 2018-04-03 ENCOUNTER — Ambulatory Visit (INDEPENDENT_AMBULATORY_CARE_PROVIDER_SITE_OTHER): Payer: Medicare HMO | Admitting: Physician Assistant

## 2018-04-03 VITALS — BP 144/96 | HR 75 | Temp 97.7°F | Resp 17 | Ht 70.0 in | Wt 242.0 lb

## 2018-04-03 DIAGNOSIS — Z122 Encounter for screening for malignant neoplasm of respiratory organs: Secondary | ICD-10-CM | POA: Diagnosis not present

## 2018-04-03 DIAGNOSIS — I2510 Atherosclerotic heart disease of native coronary artery without angina pectoris: Secondary | ICD-10-CM

## 2018-04-03 DIAGNOSIS — D582 Other hemoglobinopathies: Secondary | ICD-10-CM

## 2018-04-03 DIAGNOSIS — R69 Illness, unspecified: Secondary | ICD-10-CM | POA: Diagnosis not present

## 2018-04-03 DIAGNOSIS — F17200 Nicotine dependence, unspecified, uncomplicated: Secondary | ICD-10-CM | POA: Diagnosis not present

## 2018-04-03 DIAGNOSIS — R94120 Abnormal auditory function study: Secondary | ICD-10-CM

## 2018-04-03 DIAGNOSIS — I10 Essential (primary) hypertension: Secondary | ICD-10-CM

## 2018-04-03 DIAGNOSIS — E782 Mixed hyperlipidemia: Secondary | ICD-10-CM

## 2018-04-03 DIAGNOSIS — Z125 Encounter for screening for malignant neoplasm of prostate: Secondary | ICD-10-CM | POA: Diagnosis not present

## 2018-04-03 DIAGNOSIS — Z0000 Encounter for general adult medical examination without abnormal findings: Secondary | ICD-10-CM | POA: Diagnosis not present

## 2018-04-03 LAB — HEPATIC FUNCTION PANEL
ALBUMIN: 4.3 g/dL (ref 3.5–5.2)
ALK PHOS: 86 U/L (ref 39–117)
ALT: 15 U/L (ref 0–53)
AST: 15 U/L (ref 0–37)
BILIRUBIN DIRECT: 0.1 mg/dL (ref 0.0–0.3)
Total Bilirubin: 0.8 mg/dL (ref 0.2–1.2)
Total Protein: 6.7 g/dL (ref 6.0–8.3)

## 2018-04-03 LAB — CBC WITH DIFFERENTIAL/PLATELET
Basophils Absolute: 0.1 10*3/uL (ref 0.0–0.1)
Basophils Relative: 0.8 % (ref 0.0–3.0)
EOS PCT: 2.4 % (ref 0.0–5.0)
Eosinophils Absolute: 0.2 10*3/uL (ref 0.0–0.7)
HCT: 53.2 % — ABNORMAL HIGH (ref 39.0–52.0)
Hemoglobin: 18.2 g/dL (ref 13.0–17.0)
LYMPHS ABS: 2.4 10*3/uL (ref 0.7–4.0)
Lymphocytes Relative: 26.8 % (ref 12.0–46.0)
MCHC: 34.2 g/dL (ref 30.0–36.0)
MCV: 93.6 fl (ref 78.0–100.0)
MONO ABS: 0.6 10*3/uL (ref 0.1–1.0)
Monocytes Relative: 7.2 % (ref 3.0–12.0)
NEUTROS PCT: 62.8 % (ref 43.0–77.0)
Neutro Abs: 5.6 10*3/uL (ref 1.4–7.7)
Platelets: 260 10*3/uL (ref 150.0–400.0)
RBC: 5.69 Mil/uL (ref 4.22–5.81)
RDW: 14.5 % (ref 11.5–15.5)
WBC: 8.9 10*3/uL (ref 4.0–10.5)

## 2018-04-03 LAB — LIPID PANEL
Cholesterol: 170 mg/dL (ref 0–200)
HDL: 38.5 mg/dL — AB (ref >39.00)
LDL Cholesterol: 105 mg/dL — ABNORMAL HIGH (ref 0–99)
NONHDL: 131.5
Total CHOL/HDL Ratio: 4
Triglycerides: 134 mg/dL (ref 0.0–149.0)
VLDL: 26.8 mg/dL (ref 0.0–40.0)

## 2018-04-03 LAB — PSA: PSA: 0.55 ng/mL (ref 0.10–4.00)

## 2018-04-03 MED ORDER — BENAZEPRIL HCL 40 MG PO TABS: 40.0000 mg | ORAL_TABLET | Freq: Every day | ORAL | 1 refills | Status: DC

## 2018-04-03 MED ORDER — ATORVASTATIN CALCIUM 20 MG PO TABS: 20.0000 mg | ORAL_TABLET | Freq: Every day | ORAL | 1 refills | Status: DC

## 2018-04-03 NOTE — Progress Notes (Addendum)
Subjective:   Cameron [name] is a 67 y.o. male who presents for a Welcome to Medicare exam and CPE.   Patient denies acute concerns at today's visit. Patient with hypertension, currently on 20 mg of Benazepril daily. Is taking as directed but is also continuing to smoke. Is working on diet and trying to keep well-hydrated. Patient denies chest pain, palpitations, lightheadedness, dizziness, vision changes or frequent headaches.  BP Readings from Last 3 Encounters:  04/03/18 (!) 144/96  03/31/18 (!) 146/78  02/27/18 (!) 152/92   Is taking his Atorvastatin as directed and tolerating well. Recently saw cardiology due to incidental findings on Lung CT for Ca screen. Is set up for a stress test and echocardiogram.   Review of Systems: Review of Systems  Constitutional: Negative for fever and weight loss.  HENT: Negative for ear discharge, ear pain, hearing loss and tinnitus.   Eyes: Negative for blurred vision, double vision, photophobia and pain.  Respiratory: Negative for cough and shortness of breath.   Cardiovascular: Negative for chest pain and palpitations.  Gastrointestinal: Negative for abdominal pain, blood in stool, constipation, diarrhea, heartburn, melena, nausea and vomiting.  Genitourinary: Negative for dysuria, flank pain, frequency, hematuria and urgency.  Musculoskeletal: Negative for falls.  Neurological: Negative for dizziness, loss of consciousness and headaches.  Endo/Heme/Allergies: Negative for environmental allergies.  Psychiatric/Behavioral: Negative for depression, hallucinations, substance abuse and suicidal ideas. The patient is not nervous/anxious and does not have insomnia.     Objective:    Today's Vitals   04/03/18 1021 04/03/18 1058  BP: (!) 160/104 (!) 144/96  Pulse: 75   Resp: 17   Temp: 97.7 F (36.5 C)   TempSrc: Oral   SpO2: 96%   Weight: 242 lb (109.8 kg)   Height: 5\' 10"  (1.778 m)    Body mass index is 34.72  kg/m.  Medications Outpatient Encounter Medications as of 04/03/2018  Medication Sig  . aspirin 81 MG tablet Take 81 mg by mouth daily.  Marland Kitchen atorvastatin (LIPITOR) 20 MG tablet Take 1 tablet (20 mg total) by mouth daily.  . [DISCONTINUED] benazepril (LOTENSIN) 20 MG tablet Take 1 tablet (20 mg total) by mouth daily.  . benazepril (LOTENSIN) 40 MG tablet Take 1 tablet (40 mg total) by mouth daily.   No facility-administered encounter medications on file as of 04/03/2018.      History: Past Medical History:  Diagnosis Date  . Adenomatous colon polyp 05/2009; 11/2014   2010:Tubular adenoma, no high grade dysplasia.Marland Kitchen  + Hyperplastic polyps. 2016: Tubular adenoma-rpt 5 yrs.  . H/O chronic gastritis 05/2009   EGD: no h.pylori,dysplasia,or evidence of malignancy  . Hyperlipidemia    lovaza from prior PMD-stopped due to cost  . Hypertension   . Obesity, Class I, BMI 30.0-34.9 (see actual BMI) 04/23/2014  . Tobacco dependence    Past Surgical History:  Procedure Laterality Date  . APPENDECTOMY  1973  . COLONOSCOPY  2010   [facility]  . EYE SURGERY    . STRABISMUS SURGERY  age 78 or 80    Family History  Problem Relation Age of Onset  . Heart disease Mother   . COPD Father   . Hypertension Father   . Hyperlipidemia Father   . Diabetes Father   . Heart attack Father   . Dementia Father   . Alcohol abuse Maternal Grandfather    Social History   Occupational History  . Occupation: Retired  Tobacco Use  . Smoking status: Current Every Day  Smoker    Packs/day: 1.00    Years: 40.00    Pack years: 40.00    Types: Cigarettes  . Smokeless tobacco: Never Used  Substance and Sexual Activity  . Alcohol use: Yes    Comment: rare  . Drug use: No  . Sexual activity: Not on file   Tobacco Counseling Ready to quit: Yes Counseling given: Yes  Immunizations and Health Maintenance Immunization History  Administered Date(s) Administered  . Tdap 02/11/2012   Health Maintenance Due  Topic  Date Due  . Hepatitis C Screening  24-Jan-1951  . PNA vac Low Risk Adult (1 of 2 - PCV13) 03/11/2018    Activities of Daily Living In your present state of health, do you have any difficulty performing the following activities: 04/03/2018  Hearing? N  Vision? N  Difficulty concentrating or making decisions? N  Walking or climbing stairs? N  Dressing or bathing? N  Doing errands, shopping? N  Some recent data might be hidden   Physical Exam Physical Exam  Constitutional: He is oriented to person, place, and time and well-developed, well-nourished, and in no distress.  HENT:  Head: Normocephalic and atraumatic.  Cardiovascular: Normal rate, regular rhythm, normal heart sounds and intact distal pulses.  Pulmonary/Chest: Effort normal and breath sounds normal. No respiratory distress. He has no wheezes. He has no rales. He exhibits no tenderness.  Neurological: He is alert and oriented to person, place, and time.  Skin: Skin is dry. No rash noted.  Psychiatric: Affect normal.  Vitals reviewed.  Advanced Directives: Does Patient Have a Medical Advance Directive?: No Would patient like information on creating a medical advance directive?: Yes (MAU/Ambulatory/Procedural Areas - Information given)      Assessment:    (1) Welcome to Medicare Examination (2) Hypertension (3) Hyperlipidemia  (4) Obesity (5) Tobacco Abuse Disorder (6) Prostate Cancer Screening  Vision/Hearing screen  Hearing Screening   125Hz  250Hz  500Hz  1000Hz  2000Hz  3000Hz  4000Hz  6000Hz  8000Hz   Right ear:   Pass Pass Fail  Fail    Left ear:   Pass Pass Fail  Fail      Visual Acuity Screening   Right eye Left eye Both eyes  Without correction: 20/40 20/30 20/40   With correction:      Goals    None      Depression Screen PHQ 2/9 Scores 04/03/2018 03/09/2017 03/09/2017  PHQ - 2 Score 0 0 0  PHQ- 9 Score - - 3    Fall Risk Fall Risk  04/03/2018  Falls in the past year? No    Cognitive Function MMSE - Mini  Mental State Exam 04/03/2018  Orientation to time 5  Orientation to Place 5  Registration 3  Attention/ Calculation 5  Recall 2  Language- name 2 objects 2  Language- repeat 1  Language- follow 3 step command 3  Language- read & follow direction 1  Write a sentence 1  Copy design 1  Total score 29      Patient Care Team: Delorse Limber as PCP - General (Family Medicine) Druscilla Brownie, MD as Consulting Physician (Dermatology) Wonda Horner, MD as Consulting Physician (Gastroenterology)     Plan:  (1) Patient is up-to-date on HM parameters. Advanced directives reviewed and a copy of forms given to patient to complete and return to office. PHQ 9 negative. MMSE with score of 29/30 -- normal.  Will obtain fasting labs.   (2) BP above goal, even on recheck. Asymptomatic. Has stress test and echo  scheduled with cardiology. Continues to smoke. Is ready to quit. See below. Will increase Benazepril to 40 mg daily. Follow-up 3 weeks for reassessment.   (3) On Atorvastatin. Recheck fasting lipids and LFTS. Dietary and exercise recommendations given.   (4) Body mass index is 34.72 kg/m. Dietary and exercise recommendations reviewed.   (5) Ready to quit. Counseling on options for cessation given. He declines intervention at this time. Wants to work on quitting on his own. Will monitor closely.   (6) The natural history of prostate cancer and ongoing controversy regarding screening and potential treatment outcomes of prostate cancer has been discussed with the patient. The meaning of a false positive PSA and a false negative PSA has been discussed. He indicates understanding of the limitations of this screening test and wishes to proceed with screening PSA testing.  I have personally reviewed and noted the following in the patient's chart:   . Medical and social history . Use of alcohol, tobacco or illicit drugs  . Current medications and supplements . Functional ability and  status . Nutritional status . Physical activity . Advanced directives . List of other physicians . Hospitalizations, surgeries, and ER visits in previous 12 months . Vitals . Screenings to include cognitive, depression, and falls . Referrals and appointments  In addition, I have reviewed and discussed with patient certain preventive protocols, quality metrics, and best practice recommendations. A written personalized care plan for preventive services as well as general preventive health recommendations were provided to patient.    Leeanne Rio, PA-C 04/03/2018

## 2018-04-03 NOTE — Telephone Encounter (Signed)
Cameron [name] at [facility] called with critical lab Patient HGB is 18.2  Please advise

## 2018-04-03 NOTE — Telephone Encounter (Signed)
Patient notified of abnormal lab results and scheduled for lab visit.

## 2018-04-03 NOTE — Telephone Encounter (Signed)
Noted. Patient asymptomatic and was fasting for some time for labs. Recommend we have him hydrate well. Return Friday for repeat CBC with diff to reassess. If still elevated we will proceed with further assessment.

## 2018-04-03 NOTE — Patient Instructions (Addendum)
Please go to the lab for blood work.   Our office will call you with your results unless you have chosen to receive results via MyChart.  If your blood work is normal we will follow-up each year for physicals and as scheduled for chronic medical problems.  If anything is abnormal we will treat accordingly and get you in for a follow-up.  Please start the new dose of Benazepril. Work on the smoking an diet we discussed. Follow-up with me in 3 weeks for repeat assessment of blood pressure. Continue other medications as directed.  I am setting you up with an Audiologist for further assessment of hearing.   DASH Eating Plan DASH stands for "Dietary Approaches to Stop Hypertension." The DASH eating plan is a healthy eating plan that has been shown to reduce high blood pressure (hypertension). It may also reduce your risk for type 2 diabetes, heart disease, and stroke. The DASH eating plan may also help with weight loss. What are tips for following this plan? General guidelines  Avoid eating more than 2,300 mg (milligrams) of salt (sodium) a day. If you have hypertension, you may need to reduce your sodium intake to 1,500 mg a day.  Limit alcohol intake to no more than 1 drink a day for nonpregnant women and 2 drinks a day for men. One drink equals 12 oz of beer, 5 oz of wine, or 1 oz of hard liquor.  Work with your health care provider to maintain a healthy body weight or to lose weight. Ask what an ideal weight is for you.  Get at least 30 minutes of exercise that causes your heart to beat faster (aerobic exercise) most days of the week. Activities may include walking, swimming, or biking.  Work with your health care provider or diet and nutrition specialist (dietitian) to adjust your eating plan to your individual calorie needs. Reading food labels  Check food labels for the amount of sodium per serving. Choose foods with less than 5 percent of the Daily Value of sodium. Generally,  foods with less than 300 mg of sodium per serving fit into this eating plan.  To find whole grains, look for the word "whole" as the first word in the ingredient list. Shopping  Buy products labeled as "low-sodium" or "no salt added."  Buy fresh foods. Avoid canned foods and premade or frozen meals. Cooking  Avoid adding salt when cooking. Use salt-free seasonings or herbs instead of table salt or sea salt. Check with your health care provider or pharmacist before using salt substitutes.  Do not fry foods. Cook foods using healthy methods such as baking, boiling, grilling, and broiling instead.  Cook with heart-healthy oils, such as olive, canola, soybean, or sunflower oil. Meal planning   Eat a balanced diet that includes: ? 5 or more servings of fruits and vegetables each day. At each meal, try to fill half of your plate with fruits and vegetables. ? Up to 6-8 servings of whole grains each day. ? Less than 6 oz of lean meat, poultry, or fish each day. A 3-oz serving of meat is about the same size as a deck of cards. One egg equals 1 oz. ? 2 servings of low-fat dairy each day. ? A serving of nuts, seeds, or beans 5 times each week. ? Heart-healthy fats. Healthy fats called Omega-3 fatty acids are found in foods such as flaxseeds and coldwater fish, like sardines, salmon, and mackerel.  Limit how much you eat of the following: ?  Canned or prepackaged foods. ? Food that is high in trans fat, such as fried foods. ? Food that is high in saturated fat, such as fatty meat. ? Sweets, desserts, sugary drinks, and other foods with added sugar. ? Full-fat dairy products.  Do not salt foods before eating.  Try to eat at least 2 vegetarian meals each week.  Eat more home-cooked food and less restaurant, buffet, and fast food.  When eating at a restaurant, ask that your food be prepared with less salt or no salt, if possible. What foods are recommended? The items listed may not be a  complete list. Talk with your dietitian about what dietary choices are best for you. Grains Whole-grain or whole-wheat bread. Whole-grain or whole-wheat pasta. Brown rice. [place] Morrow. Bulgur. Whole-grain and low-sodium cereals. Pita bread. Low-fat, low-sodium crackers. Whole-wheat flour tortillas. Vegetables Fresh or frozen vegetables (raw, steamed, roasted, or grilled). Low-sodium or reduced-sodium tomato and vegetable juice. Low-sodium or reduced-sodium tomato sauce and tomato paste. Low-sodium or reduced-sodium canned vegetables. Fruits All fresh, dried, or frozen fruit. Canned fruit in natural juice (without added sugar). Meat and other protein foods Skinless chicken or [place]. Ground chicken or [place]. Pork with fat trimmed off. Fish and seafood. Egg whites. Dried beans, peas, or lentils. Unsalted nuts, nut butters, and seeds. Unsalted canned beans. Lean cuts of beef with fat trimmed off. Low-sodium, lean deli meat. Dairy Low-fat (1%) or fat-free (skim) milk. Fat-free, low-fat, or reduced-fat cheeses. Nonfat, low-sodium ricotta or cottage cheese. Low-fat or nonfat yogurt. Low-fat, low-sodium cheese. Fats and oils Soft margarine without trans fats. Vegetable oil. Low-fat, reduced-fat, or light mayonnaise and salad dressings (reduced-sodium). Canola, safflower, olive, soybean, and sunflower oils. Avocado. Seasoning and other foods Herbs. Spices. Seasoning mixes without salt. Unsalted popcorn and pretzels. Fat-free sweets. What foods are not recommended? The items listed may not be a complete list. Talk with your dietitian about what dietary choices are best for you. Grains Baked goods made with fat, such as croissants, muffins, or some breads. Dry pasta or rice meal packs. Vegetables Creamed or fried vegetables. Vegetables in a cheese sauce. Regular canned vegetables (not low-sodium or reduced-sodium). Regular canned tomato sauce and paste (not low-sodium or reduced-sodium). Regular  tomato and vegetable juice (not low-sodium or reduced-sodium). Angie Fava. Olives. Fruits Canned fruit in a light or heavy syrup. Fried fruit. Fruit in cream or butter sauce. Meat and other protein foods Fatty cuts of meat. Ribs. Fried meat. Berniece Salines. Sausage. Bologna and other processed lunch meats. Salami. Fatback. Hotdogs. Bratwurst. Salted nuts and seeds. Canned beans with added salt. Canned or smoked fish. Whole eggs or egg yolks. Chicken or [place] with skin. Dairy Whole or 2% milk, cream, and half-and-half. Whole or full-fat cream cheese. Whole-fat or sweetened yogurt. Full-fat cheese. Nondairy creamers. Whipped toppings. Processed cheese and cheese spreads. Fats and oils Butter. Stick margarine. Lard. Shortening. Ghee. Bacon fat. Tropical oils, such as coconut, palm kernel, or palm oil. Seasoning and other foods Salted popcorn and pretzels. Onion salt, garlic salt, seasoned salt, table salt, and sea salt. Worcestershire sauce. Tartar sauce. Barbecue sauce. Teriyaki sauce. Soy sauce, including reduced-sodium. Steak sauce. Canned and packaged gravies. Fish sauce. Oyster sauce. Cocktail sauce. Horseradish that you find on the shelf. Ketchup. Mustard. Meat flavorings and tenderizers. Bouillon cubes. Hot sauce and Tabasco sauce. Premade or packaged marinades. Premade or packaged taco seasonings. Relishes. Regular salad dressings. Where to find more information:  National Heart, Lung, and [place]: https://wilson-eaton.com/  American Heart Association: www.heart.org Summary  The DASH eating plan is a healthy eating plan that has been shown to reduce high blood pressure (hypertension). It may also reduce your risk for type 2 diabetes, heart disease, and stroke.  With the DASH eating plan, you should limit salt (sodium) intake to 2,300 mg a day. If you have hypertension, you may need to reduce your sodium intake to 1,500 mg a day.  When on the DASH eating plan, aim to eat more fresh fruits and  vegetables, whole grains, lean proteins, low-fat dairy, and heart-healthy fats.  Work with your health care provider or diet and nutrition specialist (dietitian) to adjust your eating plan to your individual calorie needs. This information is not intended to replace advice given to you by your health care provider. Make sure you discuss any questions you have with your health care provider. Document Released: 09/02/2011 Document Revised: 09/06/2016 Document Reviewed: 09/06/2016 Elsevier Interactive Patient Education  2018 [place] 65 Years and Older, Male Preventive care refers to lifestyle choices and visits with your health care provider that can promote health and wellness. What does preventive care include?  A yearly physical exam. This is also called an annual well check.  Dental exams once or twice a year.  Routine eye exams. Ask your health care provider how often you should have your eyes checked.  Personal lifestyle choices, including: ? Daily care of your teeth and gums. ? Regular physical activity. ? Eating a healthy diet. ? Avoiding tobacco and drug use. ? Limiting alcohol use. ? Practicing safe sex. ? Taking low doses of aspirin every day. ? Taking vitamin and mineral supplements as recommended by your health care provider. What happens during an annual well check? The services and screenings done by your health care provider during your annual well check will depend on your age, overall health, lifestyle risk factors, and family history of disease. Counseling Your health care provider may ask you questions about your:  Alcohol use.  Tobacco use.  Drug use.  Emotional well-being.  Home and relationship well-being.  Sexual activity.  Eating habits.  History of falls.  Memory and ability to understand (cognition).  Work and work Statistician.  Screening You may have the following tests or measurements:  Height, weight, and  BMI.  Blood pressure.  Lipid and cholesterol levels. These may be checked every 5 years, or more frequently if you are over 48 years old.  Skin check.  Lung cancer screening. You may have this screening every year starting at age 60 if you have a 30-pack-year history of smoking and currently smoke or have quit within the past 15 years.  Fecal occult blood test (FOBT) of the stool. You may have this test every year starting at age 3.  Flexible sigmoidoscopy or colonoscopy. You may have a sigmoidoscopy every 5 years or a colonoscopy every 10 years starting at age 17.  Prostate cancer screening. Recommendations will vary depending on your family history and other risks.  Hepatitis C blood test.  Hepatitis B blood test.  Sexually transmitted disease (STD) testing.  Diabetes screening. This is done by checking your blood sugar (glucose) after you have not eaten for a while (fasting). You may have this done every 1-3 years.  Abdominal aortic aneurysm (AAA) screening. You may need this if you are a current or former smoker.  Osteoporosis. You may be screened starting at age 53 if you are at high risk.  Talk with your health care provider about  your test results, treatment options, and if necessary, the need for more tests. Vaccines Your health care provider may recommend certain vaccines, such as:  Influenza vaccine. This is recommended every year.  Tetanus, diphtheria, and acellular pertussis (Tdap, Td) vaccine. You may need a Td booster every 10 years.  Varicella vaccine. You may need this if you have not been vaccinated.  Zoster vaccine. You may need this after age 24.  Measles, mumps, and rubella (MMR) vaccine. You may need at least one dose of MMR if you were born in 1957 or later. You may also need a second dose.  Pneumococcal 13-valent conjugate (PCV13) vaccine. One dose is recommended after age 85.  Pneumococcal polysaccharide (PPSV23) vaccine. One dose is recommended  after age 68.  Meningococcal vaccine. You may need this if you have certain conditions.  Hepatitis A vaccine. You may need this if you have certain conditions or if you travel or work in places where you may be exposed to hepatitis A.  Hepatitis B vaccine. You may need this if you have certain conditions or if you travel or work in places where you may be exposed to hepatitis B.  Haemophilus influenzae type b (Hib) vaccine. You may need this if you have certain risk factors.  Talk to your health care provider about which screenings and vaccines you need and how often you need them. This information is not intended to replace advice given to you by your health care provider. Make sure you discuss any questions you have with your health care provider. Document Released: 10/10/2015 Document Revised: 06/02/2016 Document Reviewed: 07/15/2015 Elsevier Interactive Patient Education  [employer].

## 2018-04-04 ENCOUNTER — Other Ambulatory Visit: Payer: Self-pay | Admitting: Physician Assistant

## 2018-04-04 DIAGNOSIS — R942 Abnormal results of pulmonary function studies: Secondary | ICD-10-CM

## 2018-04-07 ENCOUNTER — Other Ambulatory Visit (INDEPENDENT_AMBULATORY_CARE_PROVIDER_SITE_OTHER): Payer: Medicare HMO

## 2018-04-07 DIAGNOSIS — D582 Other hemoglobinopathies: Secondary | ICD-10-CM

## 2018-04-07 LAB — CBC WITH DIFFERENTIAL/PLATELET
BASOS PCT: 1.1 % (ref 0.0–3.0)
Basophils Absolute: 0.1 10*3/uL (ref 0.0–0.1)
Eosinophils Absolute: 0.2 10*3/uL (ref 0.0–0.7)
Eosinophils Relative: 2.4 % (ref 0.0–5.0)
HCT: 52 % (ref 39.0–52.0)
Hemoglobin: 17.4 g/dL — ABNORMAL HIGH (ref 13.0–17.0)
LYMPHS ABS: 1.9 10*3/uL (ref 0.7–4.0)
Lymphocytes Relative: 25.5 % (ref 12.0–46.0)
MCHC: 33.4 g/dL (ref 30.0–36.0)
MCV: 94.6 fl (ref 78.0–100.0)
Monocytes Absolute: 0.5 10*3/uL (ref 0.1–1.0)
Monocytes Relative: 6 % (ref 3.0–12.0)
NEUTROS PCT: 65 % (ref 43.0–77.0)
Neutro Abs: 4.9 10*3/uL (ref 1.4–7.7)
Platelets: 259 10*3/uL (ref 150.0–400.0)
RBC: 5.5 Mil/uL (ref 4.22–5.81)
RDW: 14.4 % (ref 11.5–15.5)
WBC: 7.6 10*3/uL (ref 4.0–10.5)

## 2018-04-11 ENCOUNTER — Other Ambulatory Visit: Payer: Self-pay | Admitting: Physician Assistant

## 2018-04-11 ENCOUNTER — Telehealth (HOSPITAL_COMMUNITY): Payer: Self-pay | Admitting: *Deleted

## 2018-04-11 DIAGNOSIS — D582 Other hemoglobinopathies: Secondary | ICD-10-CM

## 2018-04-11 NOTE — Telephone Encounter (Signed)
Patient given detailed instructions per Myocardial Perfusion Study Information Sheet for the test on 04/13/18 Patient notified to arrive 15 minutes early and that it is imperative to arrive on time for appointment to keep from having the test rescheduled.  If you need to cancel or reschedule your appointment, please call the office within 24 hours of your appointment. . Patient verbalized understanding. Tameron Lama Jacqueline    

## 2018-04-13 ENCOUNTER — Ambulatory Visit (HOSPITAL_COMMUNITY): Payer: Medicare HMO | Attending: Cardiovascular Disease

## 2018-04-13 VITALS — Ht 71.0 in | Wt 243.0 lb

## 2018-04-13 DIAGNOSIS — R69 Illness, unspecified: Secondary | ICD-10-CM | POA: Diagnosis not present

## 2018-04-13 DIAGNOSIS — I10 Essential (primary) hypertension: Secondary | ICD-10-CM | POA: Diagnosis not present

## 2018-04-13 DIAGNOSIS — F17200 Nicotine dependence, unspecified, uncomplicated: Secondary | ICD-10-CM

## 2018-04-13 DIAGNOSIS — I2510 Atherosclerotic heart disease of native coronary artery without angina pectoris: Secondary | ICD-10-CM

## 2018-04-13 LAB — MYOCARDIAL PERFUSION IMAGING
CHL CUP MPHR: 155 {beats}/min
CHL CUP NUCLEAR SDS: 2
Estimated workload: 7 METS
Exercise duration (min): 5 min
Exercise duration (sec): 0 s
LHR: 0.32
LV dias vol: 115 mL (ref 62–150)
LVSYSVOL: 51 mL
Peak HR: 139 {beats}/min
Percent HR: 89 %
Rest HR: 70 {beats}/min
SRS: 6
SSS: 8
TID: 1.09

## 2018-04-13 MED ORDER — TECHNETIUM TC 99M TETROFOSMIN IV KIT
32.7000 | PACK | Freq: Once | INTRAVENOUS | Status: AC | PRN
  Administered 2018-04-13: 32.7 via INTRAVENOUS
  Filled 2018-04-13: qty 33

## 2018-04-13 MED ORDER — TECHNETIUM TC 99M TETROFOSMIN IV KIT
10.5000 | PACK | Freq: Once | INTRAVENOUS | Status: AC | PRN
  Administered 2018-04-13: 10.5 via INTRAVENOUS
  Filled 2018-04-13: qty 11

## 2018-04-17 ENCOUNTER — Other Ambulatory Visit: Payer: Self-pay | Admitting: Gastroenterology

## 2018-04-17 DIAGNOSIS — K8020 Calculus of gallbladder without cholecystitis without obstruction: Secondary | ICD-10-CM | POA: Diagnosis not present

## 2018-04-18 ENCOUNTER — Telehealth: Payer: Self-pay | Admitting: Physician Assistant

## 2018-04-18 NOTE — Telephone Encounter (Signed)
I did not order the stress testing. He needs to contact Cardiology.

## 2018-04-18 NOTE — Telephone Encounter (Signed)
Called patient to provide appt information related to pulmonary referral.  While speaking with patient he states:  1.  He has not heard back from anyone regarding exercise stress test performed on 04/13/18.  Patient states he saw the results on mychart but doesn't really understand what they mean.  Requesting call back with results.  2.  Patient states during his last office visit, he and pcp discussed referring him to otology for hearing evaluation due to mild hearing loss.  Patient states his insurance will not cover hearing aids or the evaluations.  He would like to hold off on this referral at this time until he can get a better insurance plan that will cover this service.

## 2018-04-19 ENCOUNTER — Ambulatory Visit (HOSPITAL_BASED_OUTPATIENT_CLINIC_OR_DEPARTMENT_OTHER)
Admission: RE | Admit: 2018-04-19 | Discharge: 2018-04-19 | Disposition: A | Discharge status: Home / Self Care | Payer: Medicare HMO | Source: Ambulatory Visit | Attending: Cardiology | Admitting: Cardiology

## 2018-04-19 DIAGNOSIS — E785 Hyperlipidemia, unspecified: Secondary | ICD-10-CM | POA: Diagnosis not present

## 2018-04-19 DIAGNOSIS — I10 Essential (primary) hypertension: Secondary | ICD-10-CM | POA: Diagnosis not present

## 2018-04-19 DIAGNOSIS — F17200 Nicotine dependence, unspecified, uncomplicated: Secondary | ICD-10-CM | POA: Diagnosis present

## 2018-04-19 DIAGNOSIS — E669 Obesity, unspecified: Secondary | ICD-10-CM | POA: Diagnosis not present

## 2018-04-19 DIAGNOSIS — R69 Illness, unspecified: Secondary | ICD-10-CM | POA: Diagnosis not present

## 2018-04-19 NOTE — Telephone Encounter (Signed)
Left detailed message on patient's cell phone per DPR with stress test results and advised patient to contact our office with further questions or concerns.

## 2018-04-19 NOTE — Progress Notes (Addendum)
VAS US Abdominal Aortic Duplex performed   Visualization of the Proximal Abdominal Aorta, Mid Abdominal Aorta, Distal Abdominal Aorta, Right CIA Proximal artery, Right CIA Distal artery and Left CIA Proximal artery was limited.   Mid aorta diameter is 3.29.   04/19/18 Cameron [name]

## 2018-04-20 ENCOUNTER — Encounter: Payer: Self-pay | Admitting: Pulmonary Disease

## 2018-04-20 ENCOUNTER — Ambulatory Visit: Payer: Medicare HMO | Admitting: Pulmonary Disease

## 2018-04-20 DIAGNOSIS — Z23 Encounter for immunization: Secondary | ICD-10-CM | POA: Diagnosis not present

## 2018-04-20 DIAGNOSIS — F17200 Nicotine dependence, unspecified, uncomplicated: Secondary | ICD-10-CM

## 2018-04-20 DIAGNOSIS — D751 Secondary polycythemia: Secondary | ICD-10-CM

## 2018-04-20 DIAGNOSIS — J432 Centrilobular emphysema: Secondary | ICD-10-CM | POA: Insufficient documentation

## 2018-04-20 NOTE — Patient Instructions (Signed)
We discussed breathing test results and CT scan results which show emphysema.  You have to stop smoking!  High red blood cell count could be related to smoking OR sleep apnea

## 2018-04-20 NOTE — Assessment & Plan Note (Addendum)
Surprisingly PFTs show moderate restriction rather than airway obstruction with a normal FEV1 ratio. CT chest shows emphysema mostly apical, he is relatively asymptomatic hence can hold off on bronchodilators  Smoking cessation emphasized as the main intervention. He has a cough which is likely again related to smoking but if persistent can consider changing ACE inhibitor to ARB  Annual low-dose CT screening was recommended Pneumovax since he has done 27

## 2018-04-20 NOTE — Progress Notes (Signed)
Subjective:    Patient ID: Cameron [name], male    DOB: June 26, 1951, 67 y.o.   MRN: [id number]  HPI  67 year old smoker presents for evaluation of abnormal PFTs. He smokes about a pack and a half a day, more than 40 pack years.  He works as a Audiological scientist at [employer] and was [employer] for a couple of years until he obtain Medicare. He states that he has done more testing in the past year and he has in his entire life. Due to chest pain he underwent a nuclear stress test which was normal.  He also underwent screening for abdominal aortic aneurysm and had a 3.3 cm abdominal aorta. Low-dose CT chest 02/2018 showed mild centrilobular and paraseptal emphysema and mild hepatic steatosis. PFTs showed ratio of 76, FEV1 of 64% and FVC of 65% suggesting mild restriction with TLC of 96% and DLCO 62% that corrected for alveolar volume to 90%.  He denies significant dyspnea.  He reports occasional intermittent wheezing.  He denies frequent chest colds.  There is no history of orthopnea, paroxysmal nocturnal dyspnea or pedal edema.  He is widowed for 2 years, wife died of breast cancer His father is age 43 and still smokes  Labs were reviewed which show hemoglobin of 18.2-17.7   Past Medical History:  Diagnosis Date  . Adenomatous colon polyp 05/2009; 11/2014   2010:Tubular adenoma, no high grade dysplasia.Marland Kitchen  + Hyperplastic polyps. 2016: Tubular adenoma-rpt 5 yrs.  . H/O chronic gastritis 05/2009   EGD: no h.pylori,dysplasia,or evidence of malignancy  . Hyperlipidemia    lovaza from prior PMD-stopped due to cost  . Hypertension   . Obesity, Class I, BMI 30.0-34.9 (see actual BMI) 04/23/2014  . Tobacco dependence      Past Surgical History:  Procedure Laterality Date  . APPENDECTOMY  1973  . COLONOSCOPY  2010   [facility]  . EYE SURGERY    . STRABISMUS SURGERY  age 31 or 9   No Known Allergies   Social History   Socioeconomic History  . Marital status: Widowed    Spouse name: Not  on file  . Number of children: Not on file  . Years of education: Not on file  . Highest education level: Not on file  Occupational History  . Occupation: Retired  Scientific laboratory technician  . Financial resource strain: Not on file  . Food insecurity:    Worry: Not on file    Inability: Not on file  . Transportation needs:    Medical: Not on file    Non-medical: Not on file  Tobacco Use  . Smoking status: Current Every Day Smoker    Packs/day: 1.00    Years: 40.00    Pack years: 40.00    Types: Cigarettes  . Smokeless tobacco: Never Used  Substance and Sexual Activity  . Alcohol use: Yes    Comment: rare  . Drug use: No  . Sexual activity: Not on file  Lifestyle  . Physical activity:    Days per week: Not on file    Minutes per session: Not on file  . Stress: Not on file  Relationships  . Social connections:    Talks on phone: Not on file    Gets together: Not on file    Attends religious service: Not on file    Active member of club or organization: Not on file    Attends meetings of clubs or organizations: Not on file    Relationship  status: Not on file  . Intimate partner violence:    Fear of current or ex partner: Not on file    Emotionally abused: Not on file    Physically abused: Not on file    Forced sexual activity: Not on file  Other Topics Concern  . Not on file  Social History Narrative   Married, 2 daughters.   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   Orig from [place], has lived in [place] since 1970.   Tobacco 80 pack-yr hx, ongoing as of 03/2014.   Rare alcohol.  No drug use except DISTANT use of marijuana.     Family History  Problem Relation Age of Onset  . Heart disease Mother   . COPD Father   . Hypertension Father   . Hyperlipidemia Father   . Diabetes Father   . Heart attack Father   . Dementia Father   . Alcohol abuse Maternal Grandfather     Review of Systems Constitutional: negative for anorexia, fevers and sweats  Eyes: negative  for irritation, redness and visual disturbance  Ears, nose, mouth, throat, and face: negative for earaches, epistaxis, nasal congestion and sore throat  Respiratory: negative for cough, dyspnea on exertion, sputum and wheezing  Cardiovascular: negative for chest pain, dyspnea, lower extremity edema, orthopnea, palpitations and syncope  Gastrointestinal: negative for abdominal pain, constipation, diarrhea, melena, nausea and vomiting  Genitourinary:negative for dysuria, frequency and hematuria  Hematologic/lymphatic: negative for bleeding, easy bruising and lymphadenopathy  Musculoskeletal:negative for arthralgias, muscle weakness and stiff joints  Neurological: negative for coordination problems, gait problems, headaches and weakness  Endocrine: negative for diabetic symptoms including polydipsia, polyuria and weight loss     Objective:   Physical Exam  Gen. Pleasant, well-nourished, in no distress, normal affect ENT - no lesions, no post nasal drip, ruddy complexion Neck: No JVD, no thyromegaly, no carotid bruits Lungs: no use of accessory muscles, no dullness to percussion, clear without rales or rhonchi  Cardiovascular: Rhythm regular, heart sounds  normal, no murmurs or gallops, no peripheral edema Abdomen: soft and non-tender, no hepatosplenomegaly, BS normal. Musculoskeletal: No deformities, no cyanosis or clubbing Neuro:  alert, non focal       Assessment & Plan:

## 2018-04-20 NOTE — Assessment & Plan Note (Signed)
Cause may be related to smoking or undiagnosed sleep apnea. He would like to defer sleep testing at this time

## 2018-04-20 NOTE — Assessment & Plan Note (Signed)
Smoking cessation advised as the main intervention at this point

## 2018-04-24 ENCOUNTER — Ambulatory Visit: Payer: Medicare HMO | Admitting: Physician Assistant

## 2018-04-26 ENCOUNTER — Ambulatory Visit (INDEPENDENT_AMBULATORY_CARE_PROVIDER_SITE_OTHER): Payer: Medicare HMO | Admitting: Physician Assistant

## 2018-04-26 ENCOUNTER — Other Ambulatory Visit: Payer: Self-pay

## 2018-04-26 ENCOUNTER — Encounter: Payer: Self-pay | Admitting: Physician Assistant

## 2018-04-26 VITALS — BP 136/90 | HR 69 | Temp 97.8°F | Resp 16 | Ht 71.0 in | Wt 245.2 lb

## 2018-04-26 DIAGNOSIS — R69 Illness, unspecified: Secondary | ICD-10-CM | POA: Diagnosis not present

## 2018-04-26 DIAGNOSIS — F17200 Nicotine dependence, unspecified, uncomplicated: Secondary | ICD-10-CM | POA: Diagnosis not present

## 2018-04-26 DIAGNOSIS — Z1159 Encounter for screening for other viral diseases: Secondary | ICD-10-CM

## 2018-04-26 DIAGNOSIS — I10 Essential (primary) hypertension: Secondary | ICD-10-CM | POA: Diagnosis not present

## 2018-04-26 LAB — BASIC METABOLIC PANEL
BUN: 6 mg/dL (ref 6–23)
CHLORIDE: 101 meq/L (ref 96–112)
CO2: 34 mEq/L — ABNORMAL HIGH (ref 19–32)
Calcium: 9.4 mg/dL (ref 8.4–10.5)
Creatinine, Ser: 0.87 mg/dL (ref 0.40–1.50)
GFR: 93.57 mL/min (ref >60.00)
GLUCOSE: 129 mg/dL — AB (ref 70–99)
POTASSIUM: 4.6 meq/L (ref 3.5–5.1)
Sodium: 139 mEq/L (ref 135–145)

## 2018-04-26 NOTE — Progress Notes (Signed)
Patient presents to clinic today for follow-up of hypertension. At last visit, we increased his benazepril to 40 mg daily. He endorses taking this daily along with his cholesterol medication. Is trying to work harder on low-salt diet. Is still smoking. Patient denies chest pain, palpitations, lightheadedness, dizziness, vision changes or frequent headaches.  BP Readings from Last 3 Encounters:  04/26/18 136/90  04/20/18 (!) 148/86  04/03/18 (!) 144/96   Past Medical History:  Diagnosis Date  . Adenomatous colon polyp 05/2009; 11/2014   2010:Tubular adenoma, no high grade dysplasia.Marland Kitchen  + Hyperplastic polyps. 2016: Tubular adenoma-rpt 5 yrs.  . H/O chronic gastritis 05/2009   EGD: no h.pylori,dysplasia,or evidence of malignancy  . Hyperlipidemia    lovaza from prior PMD-stopped due to cost  . Hypertension   . Obesity, Class I, BMI 30.0-34.9 (see actual BMI) 04/23/2014  . Tobacco dependence     Current Outpatient Medications on File Prior to Visit  Medication Sig Dispense Refill  . aspirin 81 MG tablet Take 81 mg by mouth daily.    Marland Kitchen atorvastatin (LIPITOR) 20 MG tablet Take 1 tablet (20 mg total) by mouth daily. 90 tablet 1  . benazepril (LOTENSIN) 40 MG tablet Take 1 tablet (40 mg total) by mouth daily. 90 tablet 1   No current facility-administered medications on file prior to visit.     No Known Allergies  Family History  Problem Relation Age of Onset  . Heart disease Mother   . COPD Father   . Hypertension Father   . Hyperlipidemia Father   . Diabetes Father   . Heart attack Father   . Dementia Father   . Alcohol abuse Maternal Grandfather     Social History   Socioeconomic History  . Marital status: Widowed    Spouse name: Not on file  . Number of children: Not on file  . Years of education: Not on file  . Highest education level: Not on file  Occupational History  . Occupation: Retired  Scientific laboratory technician  . Financial resource strain: Not on file  . Food insecurity:     Worry: Not on file    Inability: Not on file  . Transportation needs:    Medical: Not on file    Non-medical: Not on file  Tobacco Use  . Smoking status: Current Every Day Smoker    Packs/day: 1.00    Years: 40.00    Pack years: 40.00    Types: Cigarettes  . Smokeless tobacco: Never Used  Substance and Sexual Activity  . Alcohol use: Yes    Comment: rare  . Drug use: No  . Sexual activity: Not on file  Lifestyle  . Physical activity:    Days per week: Not on file    Minutes per session: Not on file  . Stress: Not on file  Relationships  . Social connections:    Talks on phone: Not on file    Gets together: Not on file    Attends religious service: Not on file    Active member of club or organization: Not on file    Attends meetings of clubs or organizations: Not on file    Relationship status: Not on file  Other Topics Concern  . Not on file  Social History Narrative   Married, 2 daughters.   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   Orig from [place], has lived in [place] since 1970.   Tobacco 80 pack-yr hx, ongoing as of 03/2014.  Rare alcohol.  No drug use except DISTANT use of marijuana.   Review of Systems - See HPI.  All other ROS are negative.  BP 136/90 (BP Location: Left Arm, Cuff Size: Normal)   Pulse 69   Temp 97.8 F (36.6 C) (Oral)   Resp 16   Ht 5\' 11"  (1.803 m)   Wt 245 lb 3.2 oz (111.2 kg)   SpO2 97%   BMI 34.20 kg/m   Physical Exam  Constitutional: He appears well-developed and well-nourished.  HENT:  Head: Normocephalic and atraumatic.  Eyes: Conjunctivae are normal.  Neck: Neck supple.  Cardiovascular: Normal rate, regular rhythm, normal heart sounds and intact distal pulses.  Pulmonary/Chest: Effort normal.  Psychiatric: He has a normal mood and affect.  Vitals reviewed.   Recent Results (from the past 2160 hour(s))  Comprehensive metabolic panel     Status: Abnormal   Collection Time: 02/27/18 11:16 AM  Result Value  Ref Range   Sodium 137 135 - 145 mEq/L   Potassium 4.5 3.5 - 5.1 mEq/L   Chloride 102 96 - 112 mEq/L   CO2 29 19 - 32 mEq/L   Glucose, Bld 105 (H) 70 - 99 mg/dL   BUN 8 6 - 23 mg/dL   Creatinine, Ser 0.85 0.40 - 1.50 mg/dL   Total Bilirubin 0.6 0.2 - 1.2 mg/dL   Alkaline Phosphatase 84 39 - 117 U/L   AST 11 0 - 37 U/L   ALT 11 0 - 53 U/L   Total Protein 6.7 6.0 - 8.3 g/dL   Albumin 4.3 3.5 - 5.2 g/dL   Calcium 9.6 8.4 - 10.5 mg/dL   GFR 96.16 >60.00 mL/min  Lipid panel     Status: Abnormal   Collection Time: 02/27/18 11:16 AM  Result Value Ref Range   Cholesterol 216 (H) 0 - 200 mg/dL    Comment: ATP III Classification       Desirable:  < 200 mg/dL               Borderline High:  200 - 239 mg/dL          High:  > = 240 mg/dL   Triglycerides 243.0 (H) 0.0 - 149.0 mg/dL    Comment: Normal:  <150 mg/dLBorderline High:  150 - 199 mg/dL   HDL 32.60 (L) >39.00 mg/dL   VLDL 48.6 (H) 0.0 - 40.0 mg/dL   Total CHOL/HDL Ratio 7     Comment:                Men          Women1/2 Average Risk     3.4          3.3Average Risk          5.0          4.42X Average Risk          9.6          7.13X Average Risk          15.0          11.0                       NonHDL 183.58     Comment: NOTE:  Non-HDL goal should be 30 mg/dL higher than patient's LDL goal (i.e. LDL goal of < 70 mg/dL, would have non-HDL goal of < 100 mg/dL)  LDL cholesterol, direct     Status: None   Collection  Time: 02/27/18 11:16 AM  Result Value Ref Range   Direct LDL 144.0 mg/dL    Comment: Optimal:  <100 mg/dLNear or Above Optimal:  100-129 mg/dLBorderline High:  130-159 mg/dLHigh:  160-189 mg/dLVery High:  >190 mg/dL  Pulmonary function test     Status: None   Collection Time: 03/28/18  8:48 AM  Result Value Ref Range   FVC-Pre 3.07 L   FVC-%Pred-Pre 64 %   FVC-Post 2.91 L   FVC-%Pred-Post 60 %   FVC-%Change-Post -5 %   FEV1-Pre 2.34 L   FEV1-%Pred-Pre 65 %   FEV1-Post 2.27 L   FEV1-%Pred-Post 63 %   FEV1-%Change-Post  -3 %   FEV6-Pre 3.07 L   FEV6-%Pred-Pre 67 %   FEV6-Post 2.91 L   FEV6-%Pred-Post 64 %   FEV6-%Change-Post -5 %   Pre FEV1/FVC ratio 76 %   FEV1FVC-%Pred-Pre 101 %   Post FEV1/FVC ratio 78 %   FEV1FVC-%Change-Post 2 %   Pre FEV6/FVC Ratio 100 %   FEV6FVC-%Pred-Pre 105 %   Post FEV6/FVC ratio 100 %   FEV6FVC-%Pred-Post 105 %   FEF 25-75 Pre 1.97 L/sec   FEF2575-%Pred-Pre 69 %   FEF 25-75 Post 1.76 L/sec   FEF2575-%Pred-Post 62 %   FEF2575-%Change-Post -10 %   RV 3.65 L   RV % pred 152 %   TLC 6.96 L   TLC % pred 96 %   DLCO unc 20.92 ml/min/mmHg   DLCO unc % pred 62 %   DL/VA 4.20 ml/min/mmHg/L   DL/VA % pred 90 %  CBC w/Diff     Status: Abnormal   Collection Time: 04/03/18 11:11 AM  Result Value Ref Range   WBC 8.9 4.0 - 10.5 K/uL   RBC 5.69 4.22 - 5.81 Mil/uL   Hemoglobin 18.2 Repeated and verified X2. (HH) 13.0 - 17.0 g/dL   HCT 53.2 (H) 39.0 - 52.0 %   MCV 93.6 78.0 - 100.0 fl   MCHC 34.2 30.0 - 36.0 g/dL   RDW 14.5 11.5 - 15.5 %   Platelets 260.0 150.0 - 400.0 K/uL   Neutrophils Relative % 62.8 43.0 - 77.0 %   Lymphocytes Relative 26.8 12.0 - 46.0 %   Monocytes Relative 7.2 3.0 - 12.0 %   Eosinophils Relative 2.4 0.0 - 5.0 %   Basophils Relative 0.8 0.0 - 3.0 %   Neutro Abs 5.6 1.4 - 7.7 K/uL   Lymphs Abs 2.4 0.7 - 4.0 K/uL   Monocytes Absolute 0.6 0.1 - 1.0 K/uL   Eosinophils Absolute 0.2 0.0 - 0.7 K/uL   Basophils Absolute 0.1 0.0 - 0.1 K/uL  PSA     Status: None   Collection Time: 04/03/18 11:11 AM  Result Value Ref Range   PSA 0.55 0.10 - 4.00 ng/mL    Comment: Test performed using Access Hybritech PSA Assay, a parmagnetic partical, chemiluminecent immunoassay.  Lipid Profile     Status: Abnormal   Collection Time: 04/03/18 11:11 AM  Result Value Ref Range   Cholesterol 170 0 - 200 mg/dL    Comment: ATP III Classification       Desirable:  < 200 mg/dL               Borderline High:  200 - 239 mg/dL          High:  > = 240 mg/dL   Triglycerides 134.0  0.0 - 149.0 mg/dL    Comment: Normal:  <150 mg/dLBorderline High:  150 - 199 mg/dL  HDL 38.50 (L) >39.00 mg/dL   VLDL 26.8 0.0 - 40.0 mg/dL   LDL Cholesterol 105 (H) 0 - 99 mg/dL   Total CHOL/HDL Ratio 4     Comment:                Men          Women1/2 Average Risk     3.4          3.3Average Risk          5.0          4.42X Average Risk          9.6          7.13X Average Risk          15.0          11.0                       NonHDL 131.50     Comment: NOTE:  Non-HDL goal should be 30 mg/dL higher than patient's LDL goal (i.e. LDL goal of < 70 mg/dL, would have non-HDL goal of < 100 mg/dL)  Hepatic function panel     Status: None   Collection Time: 04/03/18 11:11 AM  Result Value Ref Range   Total Bilirubin 0.8 0.2 - 1.2 mg/dL   Bilirubin, Direct 0.1 0.0 - 0.3 mg/dL   Alkaline Phosphatase 86 39 - 117 U/L   AST 15 0 - 37 U/L   ALT 15 0 - 53 U/L   Total Protein 6.7 6.0 - 8.3 g/dL   Albumin 4.3 3.5 - 5.2 g/dL  CBC with Differential/Platelet     Status: Abnormal   Collection Time: 04/07/18 10:14 AM  Result Value Ref Range   WBC 7.6 4.0 - 10.5 K/uL   RBC 5.50 4.22 - 5.81 Mil/uL   Hemoglobin 17.4 (H) 13.0 - 17.0 g/dL   HCT 52.0 39.0 - 52.0 %   MCV 94.6 78.0 - 100.0 fl   MCHC 33.4 30.0 - 36.0 g/dL   RDW 14.4 11.5 - 15.5 %   Platelets 259.0 150.0 - 400.0 K/uL   Neutrophils Relative % 65.0 43.0 - 77.0 %   Lymphocytes Relative 25.5 12.0 - 46.0 %   Monocytes Relative 6.0 3.0 - 12.0 %   Eosinophils Relative 2.4 0.0 - 5.0 %   Basophils Relative 1.1 0.0 - 3.0 %   Neutro Abs 4.9 1.4 - 7.7 K/uL   Lymphs Abs 1.9 0.7 - 4.0 K/uL   Monocytes Absolute 0.5 0.1 - 1.0 K/uL   Eosinophils Absolute 0.2 0.0 - 0.7 K/uL   Basophils Absolute 0.1 0.0 - 0.1 K/uL  MYOCARDIAL PERFUSION IMAGING     Status: None   Collection Time: 04/13/18  1:00 PM  Result Value Ref Range   Rest HR 70 bpm   Rest BP 169/103 mmHg   Exercise duration (sec) 0 sec   Percent HR 89 %   Exercise duration (min) 5 min    Estimated workload 7.0 METS   Peak HR 139 bpm   Peak BP 227/97 mmHg   MPHR 155 bpm   SSS 8    SRS 6    SDS 2    LHR 0.32    TID 1.09    LV sys vol 51 mL   LV dias vol 115 62 - 150 mL    Assessment/Plan: HTN (hypertension), benign BP improved. Asymptomatic. Will continue current regimen. Check BMP today. Discussed proper dietary  regimen with patient. Also discussed smoking cessation. He is going to try and work on this on his own. Will follow-up 3 months.   Need for hepatitis C screening test Hep C screen ordered today.    Leeanne Rio, PA-C

## 2018-04-26 NOTE — Patient Instructions (Signed)
Please keep well-hydrated and get plenty of rest.  Make sure to take medications as directed. We have got to cut back on the fried/heavy foods!   Also please consider letting me help you with the smoking cessation!  Continue current medication regimen. Follow-up with me in 3 months for reassessment.   Please go to the lab today for blood work.  I will call you with your results. We will alter treatment regimen(s) if indicated by your results.    DASH Eating Plan DASH stands for "Dietary Approaches to Stop Hypertension." The DASH eating plan is a healthy eating plan that has been shown to reduce high blood pressure (hypertension). It may also reduce your risk for type 2 diabetes, heart disease, and stroke. The DASH eating plan may also help with weight loss. What are tips for following this plan? General guidelines  Avoid eating more than 2,300 mg (milligrams) of salt (sodium) a day. If you have hypertension, you may need to reduce your sodium intake to 1,500 mg a day.  Limit alcohol intake to no more than 1 drink a day for nonpregnant women and 2 drinks a day for men. One drink equals 12 oz of beer, 5 oz of wine, or 1 oz of hard liquor.  Work with your health care provider to maintain a healthy body weight or to lose weight. Ask what an ideal weight is for you.  Get at least 30 minutes of exercise that causes your heart to beat faster (aerobic exercise) most days of the week. Activities may include walking, swimming, or biking.  Work with your health care provider or diet and nutrition specialist (dietitian) to adjust your eating plan to your individual calorie needs. Reading food labels  Check food labels for the amount of sodium per serving. Choose foods with less than 5 percent of the Daily Value of sodium. Generally, foods with less than 300 mg of sodium per serving fit into this eating plan.  To find whole grains, look for the word "whole" as the first word in the ingredient  list. Shopping  Buy products labeled as "low-sodium" or "no salt added."  Buy fresh foods. Avoid canned foods and premade or frozen meals. Cooking  Avoid adding salt when cooking. Use salt-free seasonings or herbs instead of table salt or sea salt. Check with your health care provider or pharmacist before using salt substitutes.  Do not fry foods. Cook foods using healthy methods such as baking, boiling, grilling, and broiling instead.  Cook with heart-healthy oils, such as olive, canola, soybean, or sunflower oil. Meal planning   Eat a balanced diet that includes: ? 5 or more servings of fruits and vegetables each day. At each meal, try to fill half of your plate with fruits and vegetables. ? Up to 6-8 servings of whole grains each day. ? Less than 6 oz of lean meat, poultry, or fish each day. A 3-oz serving of meat is about the same size as a deck of cards. One egg equals 1 oz. ? 2 servings of low-fat dairy each day. ? A serving of nuts, seeds, or beans 5 times each week. ? Heart-healthy fats. Healthy fats called Omega-3 fatty acids are found in foods such as flaxseeds and coldwater fish, like sardines, salmon, and mackerel.  Limit how much you eat of the following: ? Canned or prepackaged foods. ? Food that is high in trans fat, such as fried foods. ? Food that is high in saturated fat, such as fatty meat. ?  Sweets, desserts, sugary drinks, and other foods with added sugar. ? Full-fat dairy products.  Do not salt foods before eating.  Try to eat at least 2 vegetarian meals each week.  Eat more home-cooked food and less restaurant, buffet, and fast food.  When eating at a restaurant, ask that your food be prepared with less salt or no salt, if possible. What foods are recommended? The items listed may not be a complete list. Talk with your dietitian about what dietary choices are best for you. Grains Whole-grain or whole-wheat bread. Whole-grain or whole-wheat pasta. Brown  rice. [place]. Bulgur. Whole-grain and low-sodium cereals. Pita bread. Low-fat, low-sodium crackers. Whole-wheat flour tortillas. Vegetables Fresh or frozen vegetables (raw, steamed, roasted, or grilled). Low-sodium or reduced-sodium tomato and vegetable juice. Low-sodium or reduced-sodium tomato sauce and tomato paste. Low-sodium or reduced-sodium canned vegetables. Fruits All fresh, dried, or frozen fruit. Canned fruit in natural juice (without added sugar). Meat and other protein foods Skinless chicken or [place]. Ground chicken or [place]. Pork with fat trimmed off. Fish and seafood. Egg whites. Dried beans, peas, or lentils. Unsalted nuts, nut butters, and seeds. Unsalted canned beans. Lean cuts of beef with fat trimmed off. Low-sodium, lean deli meat. Dairy Low-fat (1%) or fat-free (skim) milk. Fat-free, low-fat, or reduced-fat cheeses. Nonfat, low-sodium ricotta or cottage cheese. Low-fat or nonfat yogurt. Low-fat, low-sodium cheese. Fats and oils Soft margarine without trans fats. Vegetable oil. Low-fat, reduced-fat, or light mayonnaise and salad dressings (reduced-sodium). Canola, safflower, olive, soybean, and sunflower oils. Avocado. Seasoning and other foods Herbs. Spices. Seasoning mixes without salt. Unsalted popcorn and pretzels. Fat-free sweets. What foods are not recommended? The items listed may not be a complete list. Talk with your dietitian about what dietary choices are best for you. Grains Baked goods made with fat, such as croissants, muffins, or some breads. Dry pasta or rice meal packs. Vegetables Creamed or fried vegetables. Vegetables in a cheese sauce. Regular canned vegetables (not low-sodium or reduced-sodium). Regular canned tomato sauce and paste (not low-sodium or reduced-sodium). Regular tomato and vegetable juice (not low-sodium or reduced-sodium). Angie Fava. Olives. Fruits Canned fruit in a light or heavy syrup. Fried fruit. Fruit in cream or butter  sauce. Meat and other protein foods Fatty cuts of meat. Ribs. Fried meat. Berniece Salines. Sausage. Bologna and other processed lunch meats. Salami. Fatback. Hotdogs. Bratwurst. Salted nuts and seeds. Canned beans with added salt. Canned or smoked fish. Whole eggs or egg yolks. Chicken or [place] with skin. Dairy Whole or 2% milk, cream, and half-and-half. Whole or full-fat cream cheese. Whole-fat or sweetened yogurt. Full-fat cheese. Nondairy creamers. Whipped toppings. Processed cheese and cheese spreads. Fats and oils Butter. Stick margarine. Lard. Shortening. Ghee. Bacon fat. Tropical oils, such as coconut, palm kernel, or palm oil. Seasoning and other foods Salted popcorn and pretzels. Onion salt, garlic salt, seasoned salt, table salt, and sea salt. Worcestershire sauce. Tartar sauce. Barbecue sauce. Teriyaki sauce. Soy sauce, including reduced-sodium. Steak sauce. Canned and packaged gravies. Fish sauce. Oyster sauce. Cocktail sauce. Horseradish that you find on the shelf. Ketchup. Mustard. Meat flavorings and tenderizers. Bouillon cubes. Hot sauce and Tabasco sauce. Premade or packaged marinades. Premade or packaged taco seasonings. Relishes. Regular salad dressings. Where to find more information:  National Heart, Lung, and [place]: https://wilson-eaton.com/  American Heart Association: www.heart.org Summary  The DASH eating plan is a healthy eating plan that has been shown to reduce high blood pressure (hypertension). It may also reduce your risk for type 2 diabetes,  heart disease, and stroke.  With the DASH eating plan, you should limit salt (sodium) intake to 2,300 mg a day. If you have hypertension, you may need to reduce your sodium intake to 1,500 mg a day.  When on the DASH eating plan, aim to eat more fresh fruits and vegetables, whole grains, lean proteins, low-fat dairy, and heart-healthy fats.  Work with your health care provider or diet and nutrition specialist (dietitian) to adjust  your eating plan to your individual calorie needs. This information is not intended to replace advice given to you by your health care provider. Make sure you discuss any questions you have with your health care provider. Document Released: 09/02/2011 Document Revised: 09/06/2016 Document Reviewed: 09/06/2016 Elsevier Interactive Patient Education  [employer].

## 2018-04-26 NOTE — Assessment & Plan Note (Signed)
BP improved. Asymptomatic. Will continue current regimen. Check BMP today. Discussed proper dietary regimen with patient. Also discussed smoking cessation. He is going to try and work on this on his own. Will follow-up 3 months.

## 2018-04-26 NOTE — Assessment & Plan Note (Signed)
Hep C screen ordered today.

## 2018-04-27 LAB — HEPATITIS C ANTIBODY
Hepatitis C Ab: NONREACTIVE
SIGNAL TO CUT-OFF: 0.08 (ref <1.00)

## 2018-04-28 ENCOUNTER — Ambulatory Visit
Admission: RE | Admit: 2018-04-28 | Discharge: 2018-04-28 | Disposition: A | Discharge status: Home / Self Care | Payer: Medicare HMO | Source: Ambulatory Visit | Attending: Gastroenterology | Admitting: Gastroenterology

## 2018-04-28 DIAGNOSIS — K8020 Calculus of gallbladder without cholecystitis without obstruction: Secondary | ICD-10-CM | POA: Diagnosis not present

## 2018-05-08 DIAGNOSIS — R69 Illness, unspecified: Secondary | ICD-10-CM | POA: Diagnosis not present

## 2018-05-14 DIAGNOSIS — R69 Illness, unspecified: Secondary | ICD-10-CM | POA: Diagnosis not present

## 2018-05-14 DIAGNOSIS — I10 Essential (primary) hypertension: Secondary | ICD-10-CM | POA: Diagnosis not present

## 2018-05-14 DIAGNOSIS — N3000 Acute cystitis without hematuria: Secondary | ICD-10-CM | POA: Diagnosis not present

## 2018-05-22 ENCOUNTER — Encounter: Payer: Self-pay | Admitting: Physician Assistant

## 2018-05-22 ENCOUNTER — Ambulatory Visit (INDEPENDENT_AMBULATORY_CARE_PROVIDER_SITE_OTHER): Payer: Medicare HMO | Admitting: Physician Assistant

## 2018-05-22 ENCOUNTER — Other Ambulatory Visit: Payer: Self-pay

## 2018-05-22 ENCOUNTER — Other Ambulatory Visit: Payer: Medicare HMO

## 2018-05-22 VITALS — BP 136/88 | HR 73 | Temp 97.7°F | Resp 16 | Ht 71.0 in | Wt 246.0 lb

## 2018-05-22 DIAGNOSIS — N3090 Cystitis, unspecified without hematuria: Secondary | ICD-10-CM

## 2018-05-22 DIAGNOSIS — D582 Other hemoglobinopathies: Secondary | ICD-10-CM

## 2018-05-22 DIAGNOSIS — Z23 Encounter for immunization: Secondary | ICD-10-CM

## 2018-05-22 LAB — CBC WITH DIFFERENTIAL/PLATELET
BASOS ABS: 0.1 10*3/uL (ref 0.0–0.1)
Basophils Relative: 1 % (ref 0.0–3.0)
EOS PCT: 2.8 % (ref 0.0–5.0)
Eosinophils Absolute: 0.2 10*3/uL (ref 0.0–0.7)
HEMATOCRIT: 48.7 % (ref 39.0–52.0)
Hemoglobin: 16.7 g/dL (ref 13.0–17.0)
LYMPHS PCT: 25 % (ref 12.0–46.0)
Lymphs Abs: 1.7 10*3/uL (ref 0.7–4.0)
MCHC: 34.2 g/dL (ref 30.0–36.0)
MCV: 93.1 fl (ref 78.0–100.0)
MONOS PCT: 5.4 % (ref 3.0–12.0)
Monocytes Absolute: 0.4 10*3/uL (ref 0.1–1.0)
NEUTROS ABS: 4.5 10*3/uL (ref 1.4–7.7)
Neutrophils Relative %: 65.8 % (ref 43.0–77.0)
Platelets: 281 10*3/uL (ref 150.0–400.0)
RBC: 5.23 Mil/uL (ref 4.22–5.81)
RDW: 14.6 % (ref 11.5–15.5)
WBC: 6.9 10*3/uL (ref 4.0–10.5)

## 2018-05-22 LAB — POCT URINALYSIS DIPSTICK
BILIRUBIN UA: NEGATIVE
Blood, UA: NEGATIVE
Glucose, UA: NEGATIVE
Ketones, UA: NEGATIVE
Leukocytes, UA: NEGATIVE
Nitrite, UA: NEGATIVE
PROTEIN UA: NEGATIVE
Spec Grav, UA: 1.02 (ref 1.010–1.025)
UROBILINOGEN UA: 0.2 U/dL
pH, UA: 6 (ref 5.0–8.0)

## 2018-05-22 NOTE — Addendum Note (Signed)
Addended by: Tomi Likens on: 05/22/2018 09:43 AM   Modules accepted: Orders

## 2018-05-22 NOTE — Patient Instructions (Addendum)
Please keep well-hydrated and get plenty of rest.   Please go to the lab today for blood work.  I will call you with your results. We will alter treatment regimen(s) if indicated by your results.

## 2018-05-22 NOTE — Progress Notes (Signed)
Patient presents to clinic today for ER follow-up of cystitis. Patient was seen in ER at [facility] on 05/13/18 for complaints of urinary urgency and dysuria. Also notes a scant amount of hematuria. Was diagnosed with cystitis and started on a regimen of Ciprofloxacin 500 BID x 7 days. Endorses taking medications as directed without side effect. Notes resolution of symptoms with this regimen. Denies any fever, chills, malaise or fatigue.   Past Medical History:  Diagnosis Date  . Adenomatous colon polyp 05/2009; 11/2014   2010:Tubular adenoma, no high grade dysplasia.Marland Kitchen  + Hyperplastic polyps. 2016: Tubular adenoma-rpt 5 yrs.  . H/O chronic gastritis 05/2009   EGD: no h.pylori,dysplasia,or evidence of malignancy  . Hyperlipidemia    lovaza from prior PMD-stopped due to cost  . Hypertension   . Obesity, Class I, BMI 30.0-34.9 (see actual BMI) 04/23/2014  . Tobacco dependence     Current Outpatient Medications on File Prior to Visit  Medication Sig Dispense Refill  . aspirin 81 MG tablet Take 81 mg by mouth daily.    Marland Kitchen atorvastatin (LIPITOR) 20 MG tablet Take 1 tablet (20 mg total) by mouth daily. 90 tablet 1  . benazepril (LOTENSIN) 40 MG tablet Take 1 tablet (40 mg total) by mouth daily. 90 tablet 1  . ciprofloxacin (CIPRO) 500 MG tablet Take 500 mg by mouth every 12 (twelve) hours.  0  . phenazopyridine (PYRIDIUM) 200 MG tablet TAKE 1 TABLET BY MOUTH THREE TIMES A DAY AS NEEDED FOR PAINFULL URINATION  0   No current facility-administered medications on file prior to visit.     No Known Allergies  Family History  Problem Relation Age of Onset  . Heart disease Mother   . COPD Father   . Hypertension Father   . Hyperlipidemia Father   . Diabetes Father   . Heart attack Father   . Dementia Father   . Alcohol abuse Maternal Grandfather     Social History   Socioeconomic History  . Marital status: Widowed    Spouse name: Not on file  . Number of children: Not on file  . Years  of education: Not on file  . Highest education level: Not on file  Occupational History  . Occupation: Retired  Scientific laboratory technician  . Financial resource strain: Not on file  . Food insecurity:    Worry: Not on file    Inability: Not on file  . Transportation needs:    Medical: Not on file    Non-medical: Not on file  Tobacco Use  . Smoking status: Current Every Day Smoker    Packs/day: 1.00    Years: 40.00    Pack years: 40.00    Types: Cigarettes  . Smokeless tobacco: Never Used  Substance and Sexual Activity  . Alcohol use: Yes    Comment: rare  . Drug use: No  . Sexual activity: Not on file  Lifestyle  . Physical activity:    Days per week: Not on file    Minutes per session: Not on file  . Stress: Not on file  Relationships  . Social connections:    Talks on phone: Not on file    Gets together: Not on file    Attends religious service: Not on file    Active member of club or organization: Not on file    Attends meetings of clubs or organizations: Not on file    Relationship status: Not on file  Other Topics Concern  . Not on  file  Social History Narrative   Married, 2 daughters.   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   Orig from [place], has lived in [place] since 1970.   Tobacco 80 pack-yr hx, ongoing as of 03/2014.   Rare alcohol.  No drug use except DISTANT use of marijuana.   Review of Systems - See HPI.  All other ROS are negative.  BP 136/88   Pulse 73   Temp 97.7 F (36.5 C) (Oral)   Resp 16   Ht 5\' 11"  (1.803 m)   Wt 246 lb (111.6 kg)   SpO2 97%   BMI 34.31 kg/m   Physical Exam  Constitutional: He is oriented to person, place, and time. He appears well-developed and well-nourished.  HENT:  Head: Normocephalic and atraumatic.  Right Ear: External ear normal.  Left Ear: External ear normal.  Nose: Nose normal.  Mouth/Throat: Oropharynx is clear and moist.  Eyes: Conjunctivae are normal.  Neck: Neck supple.  Cardiovascular: Normal rate,  regular rhythm, normal heart sounds and intact distal pulses.  Pulmonary/Chest: Effort normal and breath sounds normal. No stridor. No respiratory distress. He has no wheezes. He has no rales. He exhibits no tenderness.  Neurological: He is alert and oriented to person, place, and time.  Psychiatric: He has a normal mood and affect.  Vitals reviewed.  Recent Results (from the past 2160 hour(s))  Comprehensive metabolic panel     Status: Abnormal   Collection Time: 02/27/18 11:16 AM  Result Value Ref Range   Sodium 137 135 - 145 mEq/L   Potassium 4.5 3.5 - 5.1 mEq/L   Chloride 102 96 - 112 mEq/L   CO2 29 19 - 32 mEq/L   Glucose, Bld 105 (H) 70 - 99 mg/dL   BUN 8 6 - 23 mg/dL   Creatinine, Ser 0.85 0.40 - 1.50 mg/dL   Total Bilirubin 0.6 0.2 - 1.2 mg/dL   Alkaline Phosphatase 84 39 - 117 U/L   AST 11 0 - 37 U/L   ALT 11 0 - 53 U/L   Total Protein 6.7 6.0 - 8.3 g/dL   Albumin 4.3 3.5 - 5.2 g/dL   Calcium 9.6 8.4 - 10.5 mg/dL   GFR 96.16 >60.00 mL/min  Lipid panel     Status: Abnormal   Collection Time: 02/27/18 11:16 AM  Result Value Ref Range   Cholesterol 216 (H) 0 - 200 mg/dL    Comment: ATP III Classification       Desirable:  < 200 mg/dL               Borderline High:  200 - 239 mg/dL          High:  > = 240 mg/dL   Triglycerides 243.0 (H) 0.0 - 149.0 mg/dL    Comment: Normal:  <150 mg/dLBorderline High:  150 - 199 mg/dL   HDL 32.60 (L) >39.00 mg/dL   VLDL 48.6 (H) 0.0 - 40.0 mg/dL   Total CHOL/HDL Ratio 7     Comment:                Men          Women1/2 Average Risk     3.4          3.3Average Risk          5.0          4.42X Average Risk          9.6  7.13X Average Risk          15.0          11.0                       NonHDL 183.58     Comment: NOTE:  Non-HDL goal should be 30 mg/dL higher than patient's LDL goal (i.e. LDL goal of < 70 mg/dL, would have non-HDL goal of < 100 mg/dL)  LDL cholesterol, direct     Status: None   Collection Time: 02/27/18 11:16 AM    Result Value Ref Range   Direct LDL 144.0 mg/dL    Comment: Optimal:  <100 mg/dLNear or Above Optimal:  100-129 mg/dLBorderline High:  130-159 mg/dLHigh:  160-189 mg/dLVery High:  >190 mg/dL  Pulmonary function test     Status: None   Collection Time: 03/28/18  8:48 AM  Result Value Ref Range   FVC-Pre 3.07 L   FVC-%Pred-Pre 64 %   FVC-Post 2.91 L   FVC-%Pred-Post 60 %   FVC-%Change-Post -5 %   FEV1-Pre 2.34 L   FEV1-%Pred-Pre 65 %   FEV1-Post 2.27 L   FEV1-%Pred-Post 63 %   FEV1-%Change-Post -3 %   FEV6-Pre 3.07 L   FEV6-%Pred-Pre 67 %   FEV6-Post 2.91 L   FEV6-%Pred-Post 64 %   FEV6-%Change-Post -5 %   Pre FEV1/FVC ratio 76 %   FEV1FVC-%Pred-Pre 101 %   Post FEV1/FVC ratio 78 %   FEV1FVC-%Change-Post 2 %   Pre FEV6/FVC Ratio 100 %   FEV6FVC-%Pred-Pre 105 %   Post FEV6/FVC ratio 100 %   FEV6FVC-%Pred-Post 105 %   FEF 25-75 Pre 1.97 L/sec   FEF2575-%Pred-Pre 69 %   FEF 25-75 Post 1.76 L/sec   FEF2575-%Pred-Post 62 %   FEF2575-%Change-Post -10 %   RV 3.65 L   RV % pred 152 %   TLC 6.96 L   TLC % pred 96 %   DLCO unc 20.92 ml/min/mmHg   DLCO unc % pred 62 %   DL/VA 4.20 ml/min/mmHg/L   DL/VA % pred 90 %  CBC w/Diff     Status: Abnormal   Collection Time: 04/03/18 11:11 AM  Result Value Ref Range   WBC 8.9 4.0 - 10.5 K/uL   RBC 5.69 4.22 - 5.81 Mil/uL   Hemoglobin 18.2 Repeated and verified X2. (HH) 13.0 - 17.0 g/dL   HCT 53.2 (H) 39.0 - 52.0 %   MCV 93.6 78.0 - 100.0 fl   MCHC 34.2 30.0 - 36.0 g/dL   RDW 14.5 11.5 - 15.5 %   Platelets 260.0 150.0 - 400.0 K/uL   Neutrophils Relative % 62.8 43.0 - 77.0 %   Lymphocytes Relative 26.8 12.0 - 46.0 %   Monocytes Relative 7.2 3.0 - 12.0 %   Eosinophils Relative 2.4 0.0 - 5.0 %   Basophils Relative 0.8 0.0 - 3.0 %   Neutro Abs 5.6 1.4 - 7.7 K/uL   Lymphs Abs 2.4 0.7 - 4.0 K/uL   Monocytes Absolute 0.6 0.1 - 1.0 K/uL   Eosinophils Absolute 0.2 0.0 - 0.7 K/uL   Basophils Absolute 0.1 0.0 - 0.1 K/uL  PSA     Status:  None   Collection Time: 04/03/18 11:11 AM  Result Value Ref Range   PSA 0.55 0.10 - 4.00 ng/mL    Comment: Test performed using Access Hybritech PSA Assay, a parmagnetic partical, chemiluminecent immunoassay.  Lipid Profile     Status: Abnormal   Collection Time:  04/03/18 11:11 AM  Result Value Ref Range   Cholesterol 170 0 - 200 mg/dL    Comment: ATP III Classification       Desirable:  < 200 mg/dL               Borderline High:  200 - 239 mg/dL          High:  > = 240 mg/dL   Triglycerides 134.0 0.0 - 149.0 mg/dL    Comment: Normal:  <150 mg/dLBorderline High:  150 - 199 mg/dL   HDL 38.50 (L) >39.00 mg/dL   VLDL 26.8 0.0 - 40.0 mg/dL   LDL Cholesterol 105 (H) 0 - 99 mg/dL   Total CHOL/HDL Ratio 4     Comment:                Men          Women1/2 Average Risk     3.4          3.3Average Risk          5.0          4.42X Average Risk          9.6          7.13X Average Risk          15.0          11.0                       NonHDL 131.50     Comment: NOTE:  Non-HDL goal should be 30 mg/dL higher than patient's LDL goal (i.e. LDL goal of < 70 mg/dL, would have non-HDL goal of < 100 mg/dL)  Hepatic function panel     Status: None   Collection Time: 04/03/18 11:11 AM  Result Value Ref Range   Total Bilirubin 0.8 0.2 - 1.2 mg/dL   Bilirubin, Direct 0.1 0.0 - 0.3 mg/dL   Alkaline Phosphatase 86 39 - 117 U/L   AST 15 0 - 37 U/L   ALT 15 0 - 53 U/L   Total Protein 6.7 6.0 - 8.3 g/dL   Albumin 4.3 3.5 - 5.2 g/dL  CBC with Differential/Platelet     Status: Abnormal   Collection Time: 04/07/18 10:14 AM  Result Value Ref Range   WBC 7.6 4.0 - 10.5 K/uL   RBC 5.50 4.22 - 5.81 Mil/uL   Hemoglobin 17.4 (H) 13.0 - 17.0 g/dL   HCT 52.0 39.0 - 52.0 %   MCV 94.6 78.0 - 100.0 fl   MCHC 33.4 30.0 - 36.0 g/dL   RDW 14.4 11.5 - 15.5 %   Platelets 259.0 150.0 - 400.0 K/uL   Neutrophils Relative % 65.0 43.0 - 77.0 %   Lymphocytes Relative 25.5 12.0 - 46.0 %   Monocytes Relative 6.0 3.0 - 12.0 %    Eosinophils Relative 2.4 0.0 - 5.0 %   Basophils Relative 1.1 0.0 - 3.0 %   Neutro Abs 4.9 1.4 - 7.7 K/uL   Lymphs Abs 1.9 0.7 - 4.0 K/uL   Monocytes Absolute 0.5 0.1 - 1.0 K/uL   Eosinophils Absolute 0.2 0.0 - 0.7 K/uL   Basophils Absolute 0.1 0.0 - 0.1 K/uL  MYOCARDIAL PERFUSION IMAGING     Status: None   Collection Time: 04/13/18  1:00 PM  Result Value Ref Range   Rest HR 70 bpm   Rest BP 169/103 mmHg   Exercise duration (sec) 0 sec   Percent HR  89 %   Exercise duration (min) 5 min   Estimated workload 7.0 METS   Peak HR 139 bpm   Peak BP 227/97 mmHg   MPHR 155 bpm   SSS 8    SRS 6    SDS 2    LHR 0.32    TID 1.09    LV sys vol 51 mL   LV dias vol 115 62 - 150 mL  Basic metabolic panel     Status: Abnormal   Collection Time: 04/26/18  8:44 AM  Result Value Ref Range   Sodium 139 135 - 145 mEq/L   Potassium 4.6 3.5 - 5.1 mEq/L   Chloride 101 96 - 112 mEq/L   CO2 34 (H) 19 - 32 mEq/L   Glucose, Bld 129 (H) 70 - 99 mg/dL   BUN 6 6 - 23 mg/dL   Creatinine, Ser 0.87 0.40 - 1.50 mg/dL   Calcium 9.4 8.4 - 10.5 mg/dL   GFR 93.57 >60.00 mL/min  Hepatitis C Antibody     Status: None   Collection Time: 04/26/18  8:44 AM  Result Value Ref Range   Hepatitis C Ab NON-REACTIVE NON-REACTI   SIGNAL TO CUT-OFF 0.08 <1.00    Comment: . HCV antibody was non-reactive. There is no laboratory  evidence of HCV infection. . In most cases, no further action is required. However, if recent HCV exposure is suspected, a test for HCV RNA (test code [phone]) is suggested. . For additional information please refer to http://education.quest[facility].com/faq/FAQ22v1 (This link is being provided for informational/ educational purposes only.) .   POCT Urinalysis Dipstick     Status: None   Collection Time: 05/22/18  9:17 AM  Result Value Ref Range   Color, UA yellow    Clarity, UA clear    Glucose, UA Negative Negative   Bilirubin, UA N    Ketones, UA N    Spec Grav, UA 1.020 1.010 -  1.025   Blood, UA N    pH, UA 6.0 5.0 - 8.0   Protein, UA Negative Negative   Urobilinogen, UA 0.2 0.2 or 1.0 E.U./dL   Nitrite, UA N    Leukocytes, UA Negative Negative   Appearance     Odor      Assessment/Plan: 1. Cystitis Has completed entire course of Ciprofloxacin. Symptoms have resolved. Urine dip negative. Giving age and gender, will repeat culture to ensure infection is completely eradicated.   - POCT Urinalysis Dipstick - Urine Culture   Leeanne Rio, PA-C

## 2018-05-23 LAB — URINE CULTURE
MICRO NUMBER: 91017168
RESULT: NO GROWTH
SPECIMEN QUALITY: ADEQUATE

## 2018-06-08 DIAGNOSIS — R69 Illness, unspecified: Secondary | ICD-10-CM | POA: Diagnosis not present

## 2018-06-08 DIAGNOSIS — K829 Disease of gallbladder, unspecified: Secondary | ICD-10-CM | POA: Diagnosis not present

## 2018-06-13 ENCOUNTER — Other Ambulatory Visit: Payer: Self-pay | Admitting: Surgery

## 2018-06-21 DIAGNOSIS — R69 Illness, unspecified: Secondary | ICD-10-CM | POA: Diagnosis not present

## 2018-06-27 DIAGNOSIS — R69 Illness, unspecified: Secondary | ICD-10-CM | POA: Diagnosis not present

## 2018-06-27 NOTE — Pre-Procedure Instructions (Signed)
Cameron [name]  06/27/2018      [place], [place] [address] [place] [place] [address] Phone: [phone] Fax: [phone]    Your procedure is scheduled on Oct. 10.  Report to [facility] at 7:30 A.M.  Call this number if you have problems the morning of surgery:  [phone]   Remember:  Do not eat or drink after midnight.  You may drink clear liquids until 6:30 .  Clear liquids allowed are:                    Water, Juice (non-citric and without pulp), Carbonated beverages, Clear Tea, Black Coffee only, Plain Jell-O only, Gatorade and Plain Popsicles only    Take these medicines the morning of surgery with A SIP OF WATER :  NONE                  Do not wear jewelry.  Do not wear lotions, powders, or perfumes, or deodorant.  Do not shave 48 hours prior to surgery.  Men may shave face and neck.  Do not bring valuables to the hospital.  [facility] is not responsible for any belongings or valuables.  Contacts, dentures or bridgework may not be worn into surgery.  Leave your suitcase in the car.  After surgery it may be brought to your room.  For patients admitted to the hospital, discharge time will be determined by your treatment team.  Patients discharged the day of surgery will not be allowed to drive home.    Special instructions:   [facility]- Preparing For Surgery  Before surgery, you can play an important role. Because skin is not sterile, your skin needs to be as free of germs as possible. You can reduce the number of germs on your skin by washing with CHG (chlorahexidine gluconate) Soap before surgery.  CHG is an antiseptic cleaner which kills germs and bonds with the skin to continue killing germs even after washing.    Oral Hygiene is also important to reduce your risk of infection.  Remember - BRUSH YOUR TEETH THE MORNING OF SURGERY WITH YOUR REGULAR TOOTHPASTE  Please do not use  if you have an allergy to CHG or antibacterial soaps. If your skin becomes reddened/irritated stop using the CHG.  Do not shave (including legs and underarms) for at least 48 hours prior to first CHG shower. It is OK to shave your face.  Please follow these instructions carefully.   1. Shower the NIGHT BEFORE SURGERY and the MORNING OF SURGERY with CHG.   2. If you chose to wash your hair, wash your hair first as usual with your normal shampoo.  3. After you shampoo, rinse your hair and body thoroughly to remove the shampoo.  4. Use CHG as you would any other liquid soap. You can apply CHG directly to the skin and wash gently with a scrungie or a clean washcloth.   5. Apply the CHG Soap to your body ONLY FROM THE NECK DOWN.  Do not use on open wounds or open sores. Avoid contact with your eyes, ears, mouth and genitals (private parts). Wash Face and genitals (private parts)  with your normal soap.  6. Wash thoroughly, paying special attention to the area where your surgery will be performed.  7. Thoroughly rinse your body with warm water from the neck down.  8. DO NOT shower/wash with your normal soap after  using and rinsing off the CHG Soap.  9. Pat yourself dry with a CLEAN TOWEL.  10. Wear CLEAN PAJAMAS to bed the night before surgery, wear comfortable clothes the morning of surgery  11. Place CLEAN SHEETS on your bed the night of your first shower and DO NOT SLEEP WITH PETS.    Day of Surgery:  Do not apply any deodorants/lotions.  Please wear clean clothes to the hospital/surgery center.   Remember to brush your teeth WITH YOUR REGULAR TOOTHPASTE.    Please read over the following fact sheets that you were given. Coughing and Deep Breathing and Surgical Site Infection Prevention

## 2018-06-28 ENCOUNTER — Encounter (HOSPITAL_COMMUNITY)
Admission: RE | Admit: 2018-06-28 | Discharge: 2018-06-28 | Disposition: A | Discharge status: Home / Self Care | Payer: Medicare HMO | Source: Ambulatory Visit | Attending: Surgery | Admitting: Surgery

## 2018-06-28 ENCOUNTER — Other Ambulatory Visit: Payer: Self-pay

## 2018-06-28 ENCOUNTER — Encounter (HOSPITAL_COMMUNITY): Payer: Self-pay

## 2018-06-28 DIAGNOSIS — Z01812 Encounter for preprocedural laboratory examination: Secondary | ICD-10-CM | POA: Insufficient documentation

## 2018-06-28 DIAGNOSIS — K8020 Calculus of gallbladder without cholecystitis without obstruction: Secondary | ICD-10-CM | POA: Insufficient documentation

## 2018-06-28 DIAGNOSIS — J329 Chronic sinusitis, unspecified: Secondary | ICD-10-CM | POA: Diagnosis not present

## 2018-06-28 DIAGNOSIS — Z7982 Long term (current) use of aspirin: Secondary | ICD-10-CM | POA: Diagnosis not present

## 2018-06-28 DIAGNOSIS — Z79899 Other long term (current) drug therapy: Secondary | ICD-10-CM | POA: Insufficient documentation

## 2018-06-28 DIAGNOSIS — I10 Essential (primary) hypertension: Secondary | ICD-10-CM | POA: Diagnosis not present

## 2018-06-28 DIAGNOSIS — E785 Hyperlipidemia, unspecified: Secondary | ICD-10-CM | POA: Insufficient documentation

## 2018-06-28 HISTORY — DX: Atherosclerotic heart disease of native coronary artery without angina pectoris: I25.10

## 2018-06-28 LAB — CBC WITH DIFFERENTIAL/PLATELET
Abs Immature Granulocytes: 0 10*3/uL (ref 0.0–0.1)
BASOS PCT: 1 %
Basophils Absolute: 0.1 10*3/uL (ref 0.0–0.1)
EOS ABS: 0.3 10*3/uL (ref 0.0–0.7)
EOS PCT: 4 %
HCT: 52 % (ref 39.0–52.0)
Hemoglobin: 17.2 g/dL — ABNORMAL HIGH (ref 13.0–17.0)
Immature Granulocytes: 1 %
Lymphocytes Relative: 34 %
Lymphs Abs: 2.8 10*3/uL (ref 0.7–4.0)
MCH: 31.8 pg (ref 26.0–34.0)
MCHC: 33.1 g/dL (ref 30.0–36.0)
MCV: 96.1 fL (ref 78.0–100.0)
MONO ABS: 0.7 10*3/uL (ref 0.1–1.0)
Monocytes Relative: 8 %
NEUTROS PCT: 52 %
Neutro Abs: 4.3 10*3/uL (ref 1.7–7.7)
PLATELETS: 272 10*3/uL (ref 150–400)
RBC: 5.41 MIL/uL (ref 4.22–5.81)
RDW: 13.6 % (ref 11.5–15.5)
WBC: 8.2 10*3/uL (ref 4.0–10.5)

## 2018-06-28 LAB — COMPREHENSIVE METABOLIC PANEL
ALT: 18 U/L (ref 0–44)
ANION GAP: 8 (ref 5–15)
AST: 17 U/L (ref 15–41)
Albumin: 3.8 g/dL (ref 3.5–5.0)
Alkaline Phosphatase: 86 U/L (ref 38–126)
BILIRUBIN TOTAL: 0.7 mg/dL (ref 0.3–1.2)
BUN: 6 mg/dL — ABNORMAL LOW (ref 8–23)
CHLORIDE: 103 mmol/L (ref 98–111)
CO2: 27 mmol/L (ref 22–32)
Calcium: 9 mg/dL (ref 8.9–10.3)
Creatinine, Ser: 1.05 mg/dL (ref 0.61–1.24)
GFR calc non Af Amer: >60 mL/min (ref >60)
GLUCOSE: 116 mg/dL — AB (ref 70–99)
Potassium: 4.5 mmol/L (ref 3.5–5.1)
Sodium: 138 mmol/L (ref 135–145)
Total Protein: 6.4 g/dL — ABNORMAL LOW (ref 6.5–8.1)

## 2018-06-28 NOTE — Progress Notes (Signed)
PCP: Raiford Noble  Cardiologist: Venia Minks   Currently taking ASA 81 mg - advised by Dr. Lucia Gaskins to stop taking 5 days before surgery.  Pt denies SOB, cough, fever, or chest pain  Pt stated understanding of instructions for day of surgery.

## 2018-06-29 NOTE — Progress Notes (Addendum)
Anesthesia Chart Review:  Case:  [id number] Date/Time:  07/06/18 0915   Procedure:  LAPAROSCOPIC CHOLECYSTECTOMY WITH INTRAOPERATIVE CHOLANGIOGRAM (N/A )   Anesthesia type:  General   Pre-op diagnosis:  SYMPTOMATIC CHOLELITHIASIS   Location:  [facility] OR ROOM 02 / [facility] [facility]   Surgeon:  Alphonsa Overall, MD      DISCUSSION:67 yo male current smoker. Pertinent hx includes HTN, HLD, Emphysema (mostly asymptomatic per pulm notes).  Patient recently seen by Dr. Geraldo Pitter to eval coronary calcifications seen on chest CT. Stress Cardiolite performed 04/13/2018 showed EF 55%, no ST segment deviation, low risk study.  Elevated BP at PAT appt. Review of last 3 readings shows generally better controlled. BP Readings from Last 3 Encounters:  06/28/18 (!) 173/93  05/22/18 136/88  04/26/18 136/90    Anticipate he can proceed as planned barring acute status change and BP acceptable.  VS: BP (!) 173/93   Pulse 71   Temp 36.5 C   Resp (!) 2   Ht 6' (1.829 m)   Wt 110 kg   SpO2 96%   BMI 32.89 kg/m   PROVIDERS: Brunetta Jeans, PA-C is PCP  Jyl Heinz, MD is Cardiologsit  Kara Mead, MD is Pulmonologist   LABS: Labs reviewed: Acceptable for surgery. (all labs ordered are listed, but only abnormal results are displayed)  Labs Reviewed  CBC WITH DIFFERENTIAL/PLATELET - Abnormal; Notable for the following components:      Result Value   Hemoglobin 17.2 (*)    All other components within normal limits  COMPREHENSIVE METABOLIC PANEL - Abnormal; Notable for the following components:   Glucose, Bld 116 (*)    BUN 6 (*)    Total Protein 6.4 (*)    All other components within normal limits     IMAGES: CT Chest 03/15/2018: IMPRESSION: 1. Lung-RADS 2S, benign appearance or behavior. Continue annual screening with low-dose chest CT without contrast in 12 months. 2. The "S" modifier above refers to potentially clinically significant non lung cancer related findings. Specifically, there  is aortic atherosclerosis, in addition to left main and 2 vessel coronary artery disease. Please note that although the presence of coronary artery calcium documents the presence of coronary artery disease, the severity of this disease and any potential stenosis cannot be assessed on this non-gated CT examination. Assessment for potential risk factor modification, dietary therapy or pharmacologic therapy may be warranted, if clinically indicated. 3. Mild diffuse bronchial wall thickening with mild centrilobular and paraseptal emphysema; imaging findings suggestive of underlying COPD. 4. There are mild calcifications of the aortic valve. Echocardiographic correlation for evaluation of potential valvular dysfunction may be warranted if clinically indicated. 5. Cholelithiasis. 6. Hepatic steatosis.  EKG: 03/31/2018: NSR. Septal infarct, age undetermined.  CV: Myocardial perfusion imaging 04/13/18:  Nuclear stress EF: 55%.  Blood pressure demonstrated a hypertensive response to exercise.  There was no ST segment deviation noted during stress.  The study is normal.  This is a low risk study.  The left ventricular ejection fraction is normal (55-65%).   Normal exercise nuclear stress test with no evidence for prior infarct or ischemia. Poor exercise tolerance. Hypertensive BP response to exertion.    Past Medical History:  Diagnosis Date  . Adenomatous colon polyp 05/2009; 11/2014   2010:Tubular adenoma, no high grade dysplasia.Marland Kitchen  + Hyperplastic polyps. 2016: Tubular adenoma-rpt 5 yrs.  . Coronary artery disease   . H/O chronic gastritis 05/2009   EGD: no h.pylori,dysplasia,or evidence of malignancy  . Hyperlipidemia  lovaza from prior PMD-stopped due to cost  . Hypertension   . Obesity, Class I, BMI 30.0-34.9 (see actual BMI) 04/23/2014  . Tobacco dependence     Past Surgical History:  Procedure Laterality Date  . APPENDECTOMY  1973  . COLONOSCOPY  2010   [facility]  . EYE  SURGERY    . STRABISMUS SURGERY  age 68 or 9    MEDICATIONS: . aspirin 81 MG tablet  . atorvastatin (LIPITOR) 20 MG tablet  . benazepril (LOTENSIN) 40 MG tablet   No current facility-administered medications for this encounter.     Wynonia Musty [facility] Short Stay Center/Anesthesiology Phone [phone] 06/29/2018 1:45 PM

## 2018-07-05 NOTE — H&P (Signed)
Cameron [name]  Location: [facility] Patient #: [id number] DOB: Dec 27, 1950 Single / Language: Cleophus Molt / Race: White Male  History of Present Illness   The patient is a 67 year old male who presents with a complaint of gall bladder disease.  The PCP is Sandria Manly, [place]  The patient was referred by Dr. Estell Harpin  He comes by himself. I know [place] well in that I took care of his wife, Jeannetta Nap, who had breast cancer. She died from the breast cancer around 2016/03/26.  He says that since he started Medicare in 03-27-2023 - he has seen a lot of doctors. He had a screening CT scan for his smoking to make sure that there were no noduels. On the CT scan, he was found to have gall stones. He had an [place] on 05/12/2018 which showed a contracted gb with shodowing to suggest the presence of gall stones. He saw Dr. Penelope Coop her referred him to our office. The patient does not remember ever having symptoms. However, when I got his daughter on the phone, she remembers having attack about 10 years ago. She thinks they documented gallstones at that time. There is an [place] in 05/12/2009 that shows gallstones (I did not have that information when I saw him). He was at [facility] recently and had a UTI that required a visit to the ER.  I discussed with the patient the indications and risks of gall bladder surgery. The primary risks of gall bladder surgery include, but are not limited to, bleeding, infection, common bile duct injury, and open surgery. There is also the risk that the patient may have continued symptoms after surgery. We discussed the typical post-operative recovery course. I tried to answer the patient's questions. I gave the patient literature about gall bladder surgery.  Plan: 1) I talked to his daughter, Jonelle Sidle, on the phone while he was in my office. I told them that with asymptomatic stones (though he may have had an attack 10 years ago), he has the  option of going ahead with surgery or observation. He is going to think about it and call back if he wants surgery.  Review of Systems as stated in this history (HPI) or in the review of systems. Otherwise all other 12 point ROS are negative  Past Medical History: 1. Appendectomy 2. Colonoscopy - 11/2014 3. Atherosclerotic vessels on CT scan 4. HTN 5. Hypercholesterolemia 6.  Smokes  Social History: Widowed. He has 2 daughters. Tiffany (whom I spoke to on the phone). He has a grandson and granddaughter. 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  I took care of his wife Jeannetta Nap with her breast cancer. She went to the [facility] in [place] for the last year of treatment. She died around 2016-03-26.   Past Surgical History (Tanisha A. Owens Shark, [place]; 06/08/2018 9:48 AM) Appendectomy   Diagnostic Studies History (Tanisha A. Owens Shark, [place]; 06/08/2018 9:48 AM) Colonoscopy  5-10 years ago  Allergies (Tanisha A. Owens Shark, [place]; 06/08/2018 9:49 AM) No Known Drug Allergies [06/08/2018]: Allergies Reconciled   Medication History (Tanisha A. Owens Shark, RMA; 06/08/2018 9:49 AM) Atorvastatin Calcium (20MG  Tablet, Oral) Active. Benazepril HCl (40MG  Tablet, Oral) Active. Aspirin (81MG  Tablet, Oral) Active. Medications Reconciled  Social History (Tanisha A. Owens Shark, [place]; 06/08/2018 9:48 AM) Alcohol use  Occasional alcohol use. Caffeine use  Coffee, Tea. Illicit drug use  Prefer to discuss with provider. Tobacco use  Current every day smoker.  Family History (Tanisha A. Owens Shark, [place]; 06/08/2018 9:48 AM) Diabetes Mellitus  Mother. Heart Disease  Father. Hypertension  Father. Respiratory Condition  Father.  Other Problems (Tanisha A. Owens Shark, [place]; 06/08/2018 9:48 AM) High blood pressure  Hypercholesterolemia     Review of Systems (Tanisha A. Brown  RMA; 06/08/2018 9:48 AM) General Not Present- Appetite Loss, Chills, Fatigue, Fever, Night Sweats, Weight Gain and Weight Loss. Skin Not Present- Change in Wart/Mole, Dryness, Hives, Jaundice, New Lesions, Non-Healing Wounds, Rash and Ulcer. HEENT Not Present- Earache, Hearing Loss, Hoarseness, Nose Bleed, Oral Ulcers, Ringing in the Ears, Seasonal Allergies, Sinus Pain, Sore Throat, Visual Disturbances, Wears glasses/contact lenses and Yellow Eyes. Cardiovascular Not Present- Chest Pain, Difficulty Breathing Lying Down, Leg Cramps, Palpitations, Rapid Heart Rate, Shortness of Breath and Swelling of Extremities. Gastrointestinal Not Present- Abdominal Pain, Bloating, Bloody Stool, Change in Bowel Habits, Chronic diarrhea, Constipation, Difficulty Swallowing, Excessive gas, Gets full quickly at meals, Hemorrhoids, Indigestion, Nausea, Rectal Pain and Vomiting. Musculoskeletal Not Present- Back Pain, Joint Pain, Joint Stiffness, Muscle Pain, Muscle Weakness and Swelling of Extremities. Neurological Not Present- Decreased Memory, Fainting, Headaches, Numbness, Seizures, Tingling, Tremor, Trouble walking and Weakness. Psychiatric Not Present- Anxiety, Bipolar, Change in Sleep Pattern, Depression, Fearful and Frequent crying. Endocrine Not Present- Cold Intolerance, Excessive Hunger, Hair Changes, Heat Intolerance, Hot flashes and New Diabetes. Hematology Present- Blood Thinners. Not Present- Easy Bruising, Excessive bleeding, Gland problems, HIV and Persistent Infections.  Vitals (Tanisha A. Brown RMA; 06/08/2018 9:49 AM) 06/08/2018 9:48 AM Weight: 242 lb Height: 72in Body Surface Area: 2.31 m Body Mass Index: 32.82 kg/m  Temp.: 97.79F  Pulse: 84 (Regular)  BP: 142/84 (Sitting, Left Arm, Standard)  Physical Exam  General: WN WM who is alert and generally healthy appearing. HEENT: Normal. Pupils equal. He has some caries in his front teeth.  Neck: Supple. No mass. No thyroid mass. Lymph  Nodes: No supraclavicular or cervical nodes.  Lungs: Clear to auscultation and symmetric breath sounds. Heart: RRR. No murmur or rub.  Abdomen: Soft. No mass. No tenderness. No hernia. Normal bowel sounds. No abdominal scars.   He has a diastasis recti. Rectal: Not done.  Extremities: Good strength and ROM in upper and lower extremities. Tattoo on left shoulder.  Neurologic: Grossly intact to motor and sensory function. Psychiatric: Has normal mood and affect. Behavior is normal.  Assessment & Plan  1.  GALL BLADDER DISEASE (K82.9)  Plan:  1) Lap chole with cholangiogram  2.  SMOKES (F17.200) 3. Atherosclerotic vessels on CT scan 4. HTN 5. Hypercholesterolemia  Alphonsa Overall, MD, [facility] Pager: [phone] Office phone:  [phone]

## 2018-07-06 ENCOUNTER — Ambulatory Visit (HOSPITAL_COMMUNITY): Payer: Medicare HMO | Admitting: Physician Assistant

## 2018-07-06 ENCOUNTER — Encounter (HOSPITAL_COMMUNITY): Payer: Self-pay

## 2018-07-06 ENCOUNTER — Ambulatory Visit (HOSPITAL_COMMUNITY): Payer: Medicare HMO | Admitting: Anesthesiology

## 2018-07-06 ENCOUNTER — Ambulatory Visit (HOSPITAL_COMMUNITY): Payer: Medicare HMO

## 2018-07-06 ENCOUNTER — Other Ambulatory Visit: Payer: Self-pay

## 2018-07-06 ENCOUNTER — Ambulatory Visit (HOSPITAL_COMMUNITY)
Admission: RE | Admit: 2018-07-06 | Discharge: 2018-07-06 | Disposition: A | Discharge status: Home / Self Care | Payer: Medicare HMO | Source: Ambulatory Visit | Attending: Surgery | Admitting: Surgery

## 2018-07-06 ENCOUNTER — Encounter (HOSPITAL_COMMUNITY)
Admission: RE | Disposition: A | Discharge status: Home / Self Care | Payer: Self-pay | Source: Ambulatory Visit | Attending: Surgery

## 2018-07-06 DIAGNOSIS — Z833 Family history of diabetes mellitus: Secondary | ICD-10-CM | POA: Diagnosis not present

## 2018-07-06 DIAGNOSIS — R69 Illness, unspecified: Secondary | ICD-10-CM | POA: Diagnosis not present

## 2018-07-06 DIAGNOSIS — I2510 Atherosclerotic heart disease of native coronary artery without angina pectoris: Secondary | ICD-10-CM | POA: Insufficient documentation

## 2018-07-06 DIAGNOSIS — I10 Essential (primary) hypertension: Secondary | ICD-10-CM | POA: Insufficient documentation

## 2018-07-06 DIAGNOSIS — Z836 Family history of other diseases of the respiratory system: Secondary | ICD-10-CM | POA: Diagnosis not present

## 2018-07-06 DIAGNOSIS — Z79899 Other long term (current) drug therapy: Secondary | ICD-10-CM | POA: Insufficient documentation

## 2018-07-06 DIAGNOSIS — Z6832 Body mass index (BMI) 32.0-32.9, adult: Secondary | ICD-10-CM | POA: Insufficient documentation

## 2018-07-06 DIAGNOSIS — Z8249 Family history of ischemic heart disease and other diseases of the circulatory system: Secondary | ICD-10-CM | POA: Diagnosis not present

## 2018-07-06 DIAGNOSIS — Z803 Family history of malignant neoplasm of breast: Secondary | ICD-10-CM | POA: Diagnosis not present

## 2018-07-06 DIAGNOSIS — E7800 Pure hypercholesterolemia, unspecified: Secondary | ICD-10-CM | POA: Insufficient documentation

## 2018-07-06 DIAGNOSIS — K8020 Calculus of gallbladder without cholecystitis without obstruction: Secondary | ICD-10-CM

## 2018-07-06 DIAGNOSIS — F17200 Nicotine dependence, unspecified, uncomplicated: Secondary | ICD-10-CM | POA: Diagnosis not present

## 2018-07-06 DIAGNOSIS — Z7982 Long term (current) use of aspirin: Secondary | ICD-10-CM | POA: Diagnosis not present

## 2018-07-06 DIAGNOSIS — I7090 Unspecified atherosclerosis: Secondary | ICD-10-CM | POA: Insufficient documentation

## 2018-07-06 DIAGNOSIS — E669 Obesity, unspecified: Secondary | ICD-10-CM | POA: Insufficient documentation

## 2018-07-06 DIAGNOSIS — K8010 Calculus of gallbladder with chronic cholecystitis without obstruction: Secondary | ICD-10-CM | POA: Diagnosis not present

## 2018-07-06 HISTORY — PX: CHOLECYSTECTOMY: SHX55

## 2018-07-06 SURGERY — LAPAROSCOPIC CHOLECYSTECTOMY WITH INTRAOPERATIVE CHOLANGIOGRAM
Anesthesia: General | Site: Abdomen

## 2018-07-06 MED ORDER — CEFAZOLIN SODIUM-DEXTROSE 2-4 GM/100ML-% IV SOLN
2.0000 g | INTRAVENOUS | Status: AC
  Administered 2018-07-06: 2 g via INTRAVENOUS
  Filled 2018-07-06: qty 100

## 2018-07-06 MED ORDER — LACTATED RINGERS IV SOLN
INTRAVENOUS | Status: DC
  Administered 2018-07-06: 08:00:00 via INTRAVENOUS

## 2018-07-06 MED ORDER — SODIUM CHLORIDE 0.9 % IR SOLN
Status: DC | PRN
  Administered 2018-07-06: 1

## 2018-07-06 MED ORDER — BUPIVACAINE-EPINEPHRINE (PF) 0.25% -1:200000 IJ SOLN
INTRAMUSCULAR | Status: AC
  Filled 2018-07-06: qty 30

## 2018-07-06 MED ORDER — DEXAMETHASONE SODIUM PHOSPHATE 10 MG/ML IJ SOLN
INTRAMUSCULAR | Status: DC | PRN
  Administered 2018-07-06: 10 mg via INTRAVENOUS

## 2018-07-06 MED ORDER — GABAPENTIN 300 MG PO CAPS
300.0000 mg | ORAL_CAPSULE | ORAL | Status: AC
  Administered 2018-07-06: 300 mg via ORAL
  Filled 2018-07-06: qty 1

## 2018-07-06 MED ORDER — FENTANYL CITRATE (PF) 100 MCG/2ML IJ SOLN
INTRAMUSCULAR | Status: DC | PRN
  Administered 2018-07-06 (×3): 50 ug via INTRAVENOUS
  Administered 2018-07-06: 100 ug via INTRAVENOUS

## 2018-07-06 MED ORDER — PROMETHAZINE HCL 25 MG/ML IJ SOLN: 6.2500 mg | INTRAMUSCULAR | Status: DC | PRN

## 2018-07-06 MED ORDER — BUPIVACAINE-EPINEPHRINE 0.25% -1:200000 IJ SOLN
INTRAMUSCULAR | Status: DC | PRN
  Administered 2018-07-06: 20 mL

## 2018-07-06 MED ORDER — PROPOFOL 10 MG/ML IV BOLUS
INTRAVENOUS | Status: DC | PRN
  Administered 2018-07-06: 200 mg via INTRAVENOUS

## 2018-07-06 MED ORDER — LIDOCAINE 20MG/ML (2%) 15 ML SYRINGE OPTIME
INTRAMUSCULAR | Status: DC | PRN
  Administered 2018-07-06: 100 mg via INTRAVENOUS

## 2018-07-06 MED ORDER — HYDROCODONE-ACETAMINOPHEN 5-325 MG PO TABS: 1.0000 | ORAL_TABLET | Freq: Four times a day (QID) | ORAL | 0 refills | Status: DC | PRN

## 2018-07-06 MED ORDER — 0.9 % SODIUM CHLORIDE (POUR BTL) OPTIME
TOPICAL | Status: DC | PRN
  Administered 2018-07-06: 1000 mL

## 2018-07-06 MED ORDER — KETOROLAC TROMETHAMINE 30 MG/ML IJ SOLN: 30.0000 mg | Freq: Once | INTRAMUSCULAR | Status: DC | PRN

## 2018-07-06 MED ORDER — CHLORHEXIDINE GLUCONATE CLOTH 2 % EX PADS: 6.0000 | MEDICATED_PAD | Freq: Once | CUTANEOUS | Status: DC

## 2018-07-06 MED ORDER — ACETAMINOPHEN 500 MG PO TABS
1000.0000 mg | ORAL_TABLET | ORAL | Status: AC
  Administered 2018-07-06: 1000 mg via ORAL
  Filled 2018-07-06: qty 2

## 2018-07-06 MED ORDER — MIDAZOLAM HCL 5 MG/5ML IJ SOLN
INTRAMUSCULAR | Status: DC | PRN
  Administered 2018-07-06: 2 mg via INTRAVENOUS

## 2018-07-06 MED ORDER — PHENYLEPHRINE 40 MCG/ML (10ML) SYRINGE FOR IV PUSH (FOR BLOOD PRESSURE SUPPORT)
PREFILLED_SYRINGE | INTRAVENOUS | Status: DC | PRN
  Administered 2018-07-06: 120 ug via INTRAVENOUS
  Administered 2018-07-06 (×3): 80 ug via INTRAVENOUS

## 2018-07-06 MED ORDER — SUGAMMADEX SODIUM 200 MG/2ML IV SOLN
INTRAVENOUS | Status: DC | PRN
  Administered 2018-07-06: 220 mg via INTRAVENOUS

## 2018-07-06 MED ORDER — MIDAZOLAM HCL 2 MG/2ML IJ SOLN
INTRAMUSCULAR | Status: AC
  Filled 2018-07-06: qty 2

## 2018-07-06 MED ORDER — SODIUM CHLORIDE 0.9 % IV SOLN
INTRAVENOUS | Status: DC | PRN
  Administered 2018-07-06: 30 mL

## 2018-07-06 MED ORDER — FENTANYL CITRATE (PF) 250 MCG/5ML IJ SOLN
INTRAMUSCULAR | Status: AC
  Filled 2018-07-06: qty 5

## 2018-07-06 MED ORDER — RACEPINEPHRINE HCL 2.25 % IN NEBU
0.5000 mL | INHALATION_SOLUTION | Freq: Once | RESPIRATORY_TRACT | Status: AC
  Administered 2018-07-06: 0.5 mL via RESPIRATORY_TRACT

## 2018-07-06 MED ORDER — RACEPINEPHRINE HCL 2.25 % IN NEBU
INHALATION_SOLUTION | RESPIRATORY_TRACT | Status: AC
  Filled 2018-07-06: qty 0.5

## 2018-07-06 MED ORDER — IOPAMIDOL (ISOVUE-300) INJECTION 61%
INTRAVENOUS | Status: AC
  Filled 2018-07-06: qty 50

## 2018-07-06 MED ORDER — ONDANSETRON HCL 4 MG/2ML IJ SOLN
INTRAMUSCULAR | Status: DC | PRN
  Administered 2018-07-06: 4 mg via INTRAVENOUS

## 2018-07-06 MED ORDER — HYDROMORPHONE HCL 1 MG/ML IJ SOLN: 0.2500 mg | INTRAMUSCULAR | Status: DC | PRN

## 2018-07-06 MED ORDER — ROCURONIUM BROMIDE 10 MG/ML (PF) SYRINGE
PREFILLED_SYRINGE | INTRAVENOUS | Status: DC | PRN
  Administered 2018-07-06: 40 mg via INTRAVENOUS
  Administered 2018-07-06: 10 mg via INTRAVENOUS

## 2018-07-06 MED ORDER — MEPERIDINE HCL 50 MG/ML IJ SOLN: 6.2500 mg | INTRAMUSCULAR | Status: DC | PRN

## 2018-07-06 SURGICAL SUPPLY — 52 items
ADH SKN CLS APL DERMABOND .7 (GAUZE/BANDAGES/DRESSINGS) ×1
APPLIER CLIP 5 13 M/L LIGAMAX5 (MISCELLANEOUS)
APPLIER CLIP ROT 10 11.4 M/L (STAPLE)
APR CLP MED LRG 11.4X10 (STAPLE)
APR CLP MED LRG 5 ANG JAW (MISCELLANEOUS)
BAG SPEC RTRVL 10 TROC 200 (ENDOMECHANICALS) ×1
BLADE CLIPPER SURG (BLADE) IMPLANT
CANISTER SUCT 3000ML PPV (MISCELLANEOUS) ×2 IMPLANT
CHLORAPREP W/TINT 26ML (MISCELLANEOUS) ×2 IMPLANT
CHOLANGIOGRAM CATH TAUT (CATHETERS) ×2 IMPLANT
CLIP APPLIE 5 13 M/L LIGAMAX5 (MISCELLANEOUS) IMPLANT
CLIP APPLIE ROT 10 11.4 M/L (STAPLE) IMPLANT
COVER MAYO STAND STRL (DRAPES) ×2 IMPLANT
COVER SURGICAL LIGHT HANDLE (MISCELLANEOUS) ×2 IMPLANT
COVER WAND RF STERILE (DRAPES) ×2 IMPLANT
DERMABOND ADVANCED (GAUZE/BANDAGES/DRESSINGS) ×1
DERMABOND ADVANCED .7 DNX12 (GAUZE/BANDAGES/DRESSINGS) ×1 IMPLANT
DRAPE C-ARM 42X72 X-RAY (DRAPES) ×2 IMPLANT
ELECT REM PT RETURN 9FT ADLT (ELECTROSURGICAL) ×2
ELECTRODE REM PT RTRN 9FT ADLT (ELECTROSURGICAL) ×1 IMPLANT
FILTER SMOKE EVAC LAPAROSHD (FILTER) ×2 IMPLANT
GLOVE BIO SURGEON STRL SZ7.5 (GLOVE) ×1 IMPLANT
GLOVE BIOGEL PI IND STRL 7.5 (GLOVE) IMPLANT
GLOVE BIOGEL PI INDICATOR 7.5 (GLOVE) ×1
GLOVE SURG SIGNA 7.5 PF LTX (GLOVE) ×2 IMPLANT
GLOVE SURG SS PI 7.5 STRL IVOR (GLOVE) ×1 IMPLANT
GOWN STRL REUS W/ TWL LRG LVL3 (GOWN DISPOSABLE) ×2 IMPLANT
GOWN STRL REUS W/ TWL XL LVL3 (GOWN DISPOSABLE) ×1 IMPLANT
GOWN STRL REUS W/TWL LRG LVL3 (GOWN DISPOSABLE) ×6
GOWN STRL REUS W/TWL XL LVL3 (GOWN DISPOSABLE) ×2
IV CATH 14GX2 1/4 (CATHETERS) ×2 IMPLANT
KIT BASIN OR (CUSTOM PROCEDURE TRAY) ×2 IMPLANT
KIT TURNOVER KIT B (KITS) ×2 IMPLANT
NS IRRIG 1000ML POUR BTL (IV SOLUTION) ×2 IMPLANT
PAD ARMBOARD 7.5X6 YLW CONV (MISCELLANEOUS) ×2 IMPLANT
POUCH RETRIEVAL ECOSAC 10 (ENDOMECHANICALS) ×1 IMPLANT
POUCH RETRIEVAL ECOSAC 10MM (ENDOMECHANICALS) ×1
SCISSORS LAP 5X35 DISP (ENDOMECHANICALS) ×2 IMPLANT
SET IRRIG TUBING LAPAROSCOPIC (IRRIGATION / IRRIGATOR) ×2 IMPLANT
SLEEVE ENDOPATH XCEL 5M (ENDOMECHANICALS) ×2 IMPLANT
SPECIMEN JAR SMALL (MISCELLANEOUS) ×2 IMPLANT
STOPCOCK 4 WAY LG BORE MALE ST (IV SETS) ×2 IMPLANT
SUT MNCRL AB 4-0 PS2 18 (SUTURE) ×2 IMPLANT
TOWEL OR 17X24 6PK STRL BLUE (TOWEL DISPOSABLE) ×2 IMPLANT
TOWEL OR 17X26 10 PK STRL BLUE (TOWEL DISPOSABLE) ×2 IMPLANT
TRAY LAPAROSCOPIC MC (CUSTOM PROCEDURE TRAY) ×2 IMPLANT
TROCAR XCEL BLUNT TIP 100MML (ENDOMECHANICALS) ×2 IMPLANT
TROCAR XCEL NON-BLD 11X100MML (ENDOMECHANICALS) IMPLANT
TROCAR XCEL NON-BLD 5MMX100MML (ENDOMECHANICALS) ×2 IMPLANT
TUBING EXTENTION W/L.L. (IV SETS) ×2 IMPLANT
TUBING INSUFFLATION (TUBING) ×2 IMPLANT
WATER STERILE IRR 1000ML POUR (IV SOLUTION) ×2 IMPLANT

## 2018-07-06 NOTE — Transfer of Care (Signed)
Immediate Anesthesia Transfer of Care Note  Patient: Cameron [name]  Procedure(s) Performed: LAPAROSCOPIC CHOLECYSTECTOMY WITH INTRAOPERATIVE CHOLANGIOGRAM (N/A Abdomen)  Patient Location: PACU  Anesthesia Type:General  Level of Consciousness: awake, alert , oriented and patient cooperative  Airway & Oxygen Therapy: Patient Spontanous Breathing and Patient connected to face mask oxygen  Post-op Assessment: Report given to RN, Post -op Vital signs reviewed and stable and Patient moving all extremities  Post vital signs: Reviewed and stable  Last Vitals:  Vitals Value Taken Time  BP 150/133 07/06/2018 11:21 AM  Temp    Pulse 83 07/06/2018 11:22 AM  Resp 16 07/06/2018 11:22 AM  SpO2 100 % 07/06/2018 11:22 AM  Vitals shown include unvalidated device data.  Last Pain:  Vitals:   07/06/18 0752  TempSrc: Oral  PainSc: 0-No pain         Complications: No apparent anesthesia complications

## 2018-07-06 NOTE — Op Note (Signed)
07/06/2018  11:05 AM  PATIENT:  Cameron [name], 67 y.o., male, MRN: [id number]  PREOP DIAGNOSIS:  SYMPTOMATIC CHOLELITHIASIS  POSTOP DIAGNOSIS:   Chronic cholecystitis, cholelithiasis  PROCEDURE:   Procedure(s):  LAPAROSCOPIC CHOLECYSTECTOMY WITH INTRAOPERATIVE CHOLANGIOGRAM  SURGEON:   Alphonsa Overall, M.D.  ASSISTANT:   P.J. Marlou Starks, M.D.  ANESTHESIA:   general  Anesthesiologist: Lyn Hollingshead, MD CRNA: Lowella Dell, CRNA  General  ASA: 2  EBL:  minimal  ml  BLOOD ADMINISTERED: none  DRAINS: none   LOCAL MEDICATIONS USED:   30 cc 1/4% marcaine  SPECIMEN:   Gall bladder  COUNTS CORRECT:  YES  INDICATIONS FOR PROCEDURE:  [name] is a 67 y.o. (DOB: Jan 13, 1951) white male whose primary care physician is Brunetta Jeans, PA-C and comes for cholecystectomy.   The indications and risks of the gall bladder surgery were explained to the patient.  The risks include, but are not limited to, infection, bleeding, common bile duct injury and open surgery.  SURGERY:  The patient was taken to OR room #2 at [facility].  The abdomen was prepped with chloroprep.  The patient was given 2 gm Ancef at the beginning of the operation.   A time out was held and the surgical checklist run.   An infraumbilical incision was made into the abdominal cavity.  A 12 mm Hasson trocar was inserted into the abdominal cavity through the infraumbilical incision and secured with a 0 Vicryl suture.  Three additional trocars were inserted: a 5 mm trocar in the sub-xiphoid location, a 5 mm trocar in the right mid subcostal area, and a 5 mm trocar in the right lateral subcostal area.   The abdomen was explored and the liver, stomach, and bowel that could be seen were unremarkable.   The gall bladder was chronically inflamed, thickened, and contracted.   I grasped the gall bladder, though it was hard to grasp because of the chronic inflammation, and rotated it cephalad.  Disssection was carried down  to the gall bladder/cystic duct junction and the cystic duct isolated.  A clip was placed on the gall bladder side of the cystic duct.   An intra-operative cholangiogram was shot.   The intra-operative cholangiogram was shot using a cut off Taut catheter placed through a 14 gauge angiocath in the RUQ.  The Taut catheter was inserted in the cut cystic duct and secured with an endoclip.  A cholangiogram was shot with 15 cc of 1/2 strength Isoview.  Using fluoroscopy, the cholangiogram showed the flow of contrast into the common bile duct, up the hepatic radicals, and into the duodenum.  There was no mass or obstruction.  This was a normal intra-operative cholangiogram.   The Taut catheter was removed.  The cystic duct was tripley endoclipped and the cystic artery was identified and clipped.  The gall bladder was bluntly and sharpley dissected from the gall bladder bed.   After the gall bladder was removed from the liver, the gall bladder bed and Triangle of Calot were inspected.  There was no bleeding or bile leak.  The gall bladder was placed in a Ecco Sac bag and delivered through the umbilicus.  The abdomen was irrigated with 600 cc saline.   The trocars were then removed.  I infiltrated 30cc of 1/4% Marcaine into the incisions.  The umbilical port closed with a 0 Vicryl suture and the skin closed with 4-0 Monocryl.  The skin was painted with DermaBond.  The patient's sponge and  needle count were correct.  The patient was transported to the RR in good condition.  Alphonsa Overall, MD, [facility] Pager: [phone] Office phone:  [phone]

## 2018-07-06 NOTE — Interval H&P Note (Signed)
History and Physical Interval Note:  07/06/2018 9:29 AM  Cameron [name]  has presented today for surgery, with the diagnosis of SYMPTOMATIC CHOLELITHIASIS  The various methods of treatment have been discussed with the patient and family.  Daughter in room with patient.  After consideration of risks, benefits and other options for treatment, the patient has consented to  Procedure(s): LAPAROSCOPIC CHOLECYSTECTOMY WITH INTRAOPERATIVE CHOLANGIOGRAM (N/A) as a surgical intervention .  The patient's history has been reviewed, patient examined, no change in status, stable for surgery.  I have reviewed the patient's chart and labs.  Questions were answered to the patient's satisfaction.     Shann Medal

## 2018-07-06 NOTE — Anesthesia Preprocedure Evaluation (Signed)
Anesthesia Evaluation  Patient identified by MRN, date of birth, ID band Patient awake    Reviewed: Allergy & Precautions, NPO status , Patient's Chart, lab work & pertinent test results  Airway Mallampati: I       Dental no notable dental hx. (+) Teeth Intact   Pulmonary Current Smoker,    Pulmonary exam normal breath sounds clear to auscultation       Cardiovascular hypertension, Pt. on medications + CAD  Normal cardiovascular exam Rhythm:Regular Rate:Normal     Neuro/Psych negative neurological ROS  negative psych ROS   GI/Hepatic negative GI ROS, Neg liver ROS,   Endo/Other  negative endocrine ROS  Renal/GU negative Renal ROS  negative genitourinary   Musculoskeletal negative musculoskeletal ROS (+)   Abdominal (+) + obese,   Peds  Hematology negative hematology ROS (+)   Anesthesia Other Findings Cameron [name]  GATED SPECT MYO PERF W/EXERCISE STRESS 1D  Order# [id number]  Reading physician: Dorothy Spark, MD Ordering physician: Jenean Lindau, MD Study date: 04/13/18 Patient Information   Name MRN Description Cameron [name] [id number] 67 y.o. male Result Notes for MYOCARDIAL PERFUSION IMAGING   Notes recorded by Revankar, Reita Cliche, MD on 04/14/2018 at 8:12 AM EDT The results of the study is unremarkable. Please inform patient. I will discuss in detail at next appointment. Cc primary care/referring physician Jenean Lindau, MD 04/14/2018 8:12 AM   Vitals   Height Weight BMI (Calculated) 5\' 11"  (1.803 m) 111.2 kg 34.21 Study Highlights     Nuclear stress EF: 55%.  Blood pressure demonstrated a hypertensive response to exercise.  There was no ST segment deviation noted during stress.  The study is normal.  This is a low risk study.  The left ventricular ejection fraction is normal (55-65%).   Normal exercise nuclear stress test with no evidence for prior infarct or ischemia. Poor  exercise tolerance. Hypertensive BP response to exertion.      Reproductive/Obstetrics                             Anesthesia Physical Anesthesia Plan  ASA: II  Anesthesia Plan: General   Post-op Pain Management:    Induction: Intravenous  PONV Risk Score and Plan: 3 and Ondansetron, Dexamethasone and Midazolam  Airway Management Planned: Oral ETT  Additional Equipment:   Intra-op Plan:   Post-operative Plan: Extubation in OR  Informed Consent: I have reviewed the patients History and Physical, chart, labs and discussed the procedure including the risks, benefits and alternatives for the proposed anesthesia with the patient or authorized representative who has indicated his/her understanding and acceptance.   Dental advisory given  Plan Discussed with: CRNA and Surgeon  Anesthesia Plan Comments:         Anesthesia Quick Evaluation

## 2018-07-06 NOTE — Anesthesia Procedure Notes (Signed)
Procedure Name: Intubation Date/Time: 07/06/2018 9:55 AM Performed by: Lowella Dell, CRNA Pre-anesthesia Checklist: Patient identified, Emergency Drugs available, Suction available and Patient being monitored Patient Re-evaluated:Patient Re-evaluated prior to induction Oxygen Delivery Method: Circle System Utilized Preoxygenation: Pre-oxygenation with 100% oxygen Induction Type: IV induction Ventilation: Mask ventilation without difficulty Laryngoscope Size: Mac and 4 Grade View: Grade II Tube type: Oral Tube size: 7.5 mm Number of attempts: 1 Airway Equipment and Method: Stylet Placement Confirmation: ETT inserted through vocal cords under direct vision,  positive ETCO2 and breath sounds checked- equal and bilateral Secured at: 23 cm Tube secured with: Tape Dental Injury: Teeth and Oropharynx as per pre-operative assessment

## 2018-07-06 NOTE — Discharge Instructions (Signed)
[facility] - DISCHARGE INSTRUCTIONS TO PATIENT  Return to work on:  10 days  Activity:  Driving - May drive in 3 to 5 days, if doing well   Lifting - No lifting more than 15 pounds for 10 days, then no limit  Wound Care:   Leave the incision dry for 2 days, then you may shower  Diet:  As tolerated  Follow up appointment:  Call Dr. Pollie Friar office [facility]) at [phone] for an appointment in 2 to 4 weeks.  Medications and dosages:  Resume your home medications.  You have a prescription for:  Vicodin  Call Dr. Lucia Gaskins or his office  [phone]) if you have:  Temperature greater than 100.4,  Persistent nausea and vomiting,  Severe uncontrolled pain,  Redness, tenderness, or signs of infection (pain, swelling, redness, odor or green/yellow discharge around the site),  Difficulty breathing, headache or visual disturbances,  Any other questions or concerns you may have after discharge.  In an emergency, call 911 or go to an Emergency Department at a nearby hospital.   \

## 2018-07-06 NOTE — Anesthesia Postprocedure Evaluation (Signed)
Anesthesia Post Note  Patient: Cameron [name]  Procedure(s) Performed: LAPAROSCOPIC CHOLECYSTECTOMY WITH INTRAOPERATIVE CHOLANGIOGRAM (N/A Abdomen)     Patient location during evaluation: PACU Anesthesia Type: General Level of consciousness: awake Pain management: pain level controlled Vital Signs Assessment: post-procedure vital signs reviewed and stable Respiratory status: spontaneous breathing Cardiovascular status: stable Postop Assessment: no apparent nausea or vomiting Anesthetic complications: no    Last Vitals:  Vitals:   07/06/18 1135 07/06/18 1150  BP: (!) 168/82 (!) 170/82  Pulse: 64 (!) 57  Resp: 14 16  Temp:    SpO2: 100% 98%    Last Pain:  Vitals:   07/06/18 1150  TempSrc:   PainSc: 0-No pain   Pain Goal:                 Cameron [name],Cameron [name]

## 2018-07-07 ENCOUNTER — Encounter (HOSPITAL_COMMUNITY): Payer: Self-pay | Admitting: Surgery

## 2018-07-12 DIAGNOSIS — H524 Presbyopia: Secondary | ICD-10-CM | POA: Diagnosis not present

## 2018-07-12 DIAGNOSIS — H5202 Hypermetropia, left eye: Secondary | ICD-10-CM | POA: Diagnosis not present

## 2018-07-12 DIAGNOSIS — H52222 Regular astigmatism, left eye: Secondary | ICD-10-CM | POA: Diagnosis not present

## 2018-07-27 ENCOUNTER — Encounter: Payer: Self-pay | Admitting: Physician Assistant

## 2018-07-27 ENCOUNTER — Other Ambulatory Visit: Payer: Self-pay

## 2018-07-27 ENCOUNTER — Ambulatory Visit (INDEPENDENT_AMBULATORY_CARE_PROVIDER_SITE_OTHER): Payer: Medicare HMO | Admitting: Physician Assistant

## 2018-07-27 VITALS — BP 130/84 | HR 69 | Temp 98.1°F | Resp 16 | Ht 71.0 in | Wt 242.0 lb

## 2018-07-27 DIAGNOSIS — R69 Illness, unspecified: Secondary | ICD-10-CM | POA: Diagnosis not present

## 2018-07-27 DIAGNOSIS — I10 Essential (primary) hypertension: Secondary | ICD-10-CM

## 2018-07-27 DIAGNOSIS — R0681 Apnea, not elsewhere classified: Secondary | ICD-10-CM

## 2018-07-27 DIAGNOSIS — F17200 Nicotine dependence, unspecified, uncomplicated: Secondary | ICD-10-CM

## 2018-07-27 DIAGNOSIS — E669 Obesity, unspecified: Secondary | ICD-10-CM | POA: Diagnosis not present

## 2018-07-27 NOTE — Patient Instructions (Addendum)
Please continue medications as directed. You will be contacted for a home sleep study.  Please continue to work on diet, exercise and weaning on the cigarette use. Please reconsider letting us help you with smoking cessation.   Follow-up in July for your physical and wellness visit. Return sooner if needed.   Steps to Quit Smoking Smoking tobacco can be bad for your health. It can also affect almost every organ in your body. Smoking puts you and people around you at risk for many serious long-lasting (chronic) diseases. Quitting smoking is hard, but it is one of the best things that you can do for your health. It is never too late to quit. What are the benefits of quitting smoking? When you quit smoking, you lower your risk for getting serious diseases and conditions. They can include:  Lung cancer or lung disease.  Heart disease.  Stroke.  Heart attack.  Not being able to have children (infertility).  Weak bones (osteoporosis) and broken bones (fractures).  If you have coughing, wheezing, and shortness of breath, those symptoms may get better when you quit. You may also get sick less often. If you are pregnant, quitting smoking can help to lower your chances of having a baby of low birth weight. What can I do to help me quit smoking? Talk with your doctor about what can help you quit smoking. Some things you can do (strategies) include:  Quitting smoking totally, instead of slowly cutting back how much you smoke over a period of time.  Going to in-person counseling. You are more likely to quit if you go to many counseling sessions.  Using resources and support systems, such as: ? Database administrator with a Social worker. ? Phone quitlines. ? Careers information officer. ? Support groups or group counseling. ? Text messaging programs. ? Mobile phone apps or applications.  Taking medicines. Some of these medicines may have nicotine in them. If you are pregnant or breastfeeding, do not  take any medicines to quit smoking unless your doctor says it is okay. Talk with your doctor about counseling or other things that can help you.  Talk with your doctor about using more than one strategy at the same time, such as taking medicines while you are also going to in-person counseling. This can help make quitting easier. What things can I do to make it easier to quit? Quitting smoking might feel very hard at first, but there is a lot that you can do to make it easier. Take these steps:  Talk to your family and friends. Ask them to support and encourage you.  Call phone quitlines, reach out to support groups, or work with a Social worker.  Ask people who smoke to not smoke around you.  Avoid places that make you want (trigger) to smoke, such as: ? Bars. ? Parties. ? Smoke-break areas at work.  Spend time with people who do not smoke.  Lower the stress in your life. Stress can make you want to smoke. Try these things to help your stress: ? Getting regular exercise. ? Deep-breathing exercises. ? Yoga. ? Meditating. ? Doing a body scan. To do this, close your eyes, focus on one area of your body at a time from head to toe, and notice which parts of your body are tense. Try to relax the muscles in those areas.  Download or buy apps on your mobile phone or tablet that can help you stick to your quit plan. There are many free apps, such as  QuitGuide from the CDC [employer] for Disease Control and Prevention). You can find more support from smokefree.gov and other websites.  This information is not intended to replace advice given to you by your health care provider. Make sure you discuss any questions you have with your health care provider. Document Released: 07/10/2009 Document Revised: 05/11/2016 Document Reviewed: 01/28/2015 Elsevier Interactive Patient Education  2018 [employer].

## 2018-07-27 NOTE — Progress Notes (Signed)
Patient presents to clinic today for 58-monthfollow-up of hypertension and hyperlipidemia. Patient is currently on a regimen of Atorvastatin 20 mg daily and Benazepril 40 mg daily. Endorses taking medications as directed and still tolerating well. Is not checking BP at home. Is keeping active with work but is currently on light duty after a gallbladder surgery. Patient denies chest pain, palpitations, lightheadedness, dizziness, vision changes or frequent headaches. Is taking his 81 mg ASA daily due to history of CAD.  Still smoking a few cigarettes per day. Declines intervention currently as he is weaning down on his own.  Past Medical History:  Diagnosis Date  . Adenomatous colon polyp 05/2009; 11/2014   2010:Tubular adenoma, no high grade dysplasia..Marland Kitchen + Hyperplastic polyps. 2016: Tubular adenoma-rpt 5 yrs.  . Coronary artery disease   . H/O chronic gastritis 05/2009   EGD: no h.pylori,dysplasia,or evidence of malignancy  . Hyperlipidemia    lovaza from prior PMD-stopped due to cost  . Hypertension   . Obesity, Class I, BMI 30.0-34.9 (see actual BMI) 04/23/2014  . Tobacco dependence     Current Outpatient Medications on File Prior to Visit  Medication Sig Dispense Refill  . aspirin 81 MG tablet Take 81 mg by mouth daily.    .Marland Kitchenatorvastatin (LIPITOR) 20 MG tablet Take 1 tablet (20 mg total) by mouth daily. 90 tablet 1  . benazepril (LOTENSIN) 40 MG tablet Take 1 tablet (40 mg total) by mouth daily. 90 tablet 1   No current facility-administered medications on file prior to visit.     No Known Allergies  Family History  Problem Relation Age of Onset  . Heart disease Mother   . COPD Father   . Hypertension Father   . Hyperlipidemia Father   . Diabetes Father   . Heart attack Father   . Dementia Father   . Alcohol abuse Maternal Grandfather     Social History   Socioeconomic History  . Marital status: Widowed    Spouse name: Not on file  . Number of children: Not on file  .  Years of education: Not on file  . Highest education level: Not on file  Occupational History  . Occupation: Retired  SScientific laboratory technician . Financial resource strain: Not on file  . Food insecurity:    Worry: Not on file    Inability: Not on file  . Transportation needs:    Medical: Not on file    Non-medical: Not on file  Tobacco Use  . Smoking status: Current Every Day Smoker    Packs/day: 1.00    Years: 40.00    Pack years: 40.00    Types: Cigarettes  . Smokeless tobacco: Never Used  Substance and Sexual Activity  . Alcohol use: Yes    Comment: rare  . Drug use: No  . Sexual activity: Not on file  Lifestyle  . Physical activity:    Days per week: Not on file    Minutes per session: Not on file  . Stress: Not on file  Relationships  . Social connections:    Talks on phone: Not on file    Gets together: Not on file    Attends religious service: Not on file    Active member of club or organization: Not on file    Attends meetings of clubs or organizations: Not on file    Relationship status: Not on file  Other Topics Concern  . Not on file  Social History Narrative  Married, 2 daughters.   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   Orig from [place], has lived in [place] since 1970.   Tobacco 80 pack-yr hx, ongoing as of 03/2014.   Rare alcohol.  No drug use except DISTANT use of marijuana.   Review of Systems - See HPI.  All other ROS are negative.  BP 130/84   Pulse 69   Temp 98.1 F (36.7 C) (Oral)   Resp 16   Ht '5\' 11"'$  (1.803 m)   Wt 242 lb (109.8 kg)   SpO2 98%   BMI 33.75 kg/m   Physical Exam  Constitutional: He is oriented to person, place, and time. He appears well-developed and well-nourished.  HENT:  Head: Normocephalic and atraumatic.  Eyes: Conjunctivae are normal.  Neck: Neck supple. No thyromegaly present.  Cardiovascular: Normal rate, regular rhythm, normal heart sounds and intact distal pulses.  Pulmonary/Chest: Effort normal and  breath sounds normal. No stridor. No respiratory distress.  Neurological: He is alert and oriented to person, place, and time. No cranial nerve deficit.  Psychiatric: He has a normal mood and affect.  Vitals reviewed.  Recent Results (from the past 2160 hour(s))  POCT Urinalysis Dipstick     Status: None   Collection Time: 05/22/18  9:17 AM  Result Value Ref Range   Color, UA yellow    Clarity, UA clear    Glucose, UA Negative Negative   Bilirubin, UA N    Ketones, UA N    Spec Grav, UA 1.020 1.010 - 1.025   Blood, UA N    pH, UA 6.0 5.0 - 8.0   Protein, UA Negative Negative   Urobilinogen, UA 0.2 0.2 or 1.0 E.U./dL   Nitrite, UA N    Leukocytes, UA Negative Negative   Appearance     Odor    Urine Culture     Status: None   Collection Time: 05/22/18  9:42 AM  Result Value Ref Range   MICRO NUMBER: [id number]    SPECIMEN QUALITY: ADEQUATE    Sample Source URINE    STATUS: FINAL    Result: No Growth   CBC with Differential/Platelet     Status: None   Collection Time: 05/22/18  9:43 AM  Result Value Ref Range   WBC 6.9 4.0 - 10.5 K/uL   RBC 5.23 4.22 - 5.81 Mil/uL   Hemoglobin 16.7 13.0 - 17.0 g/dL   HCT 48.7 39.0 - 52.0 %   MCV 93.1 78.0 - 100.0 fl   MCHC 34.2 30.0 - 36.0 g/dL   RDW 14.6 11.5 - 15.5 %   Platelets 281.0 150.0 - 400.0 K/uL   Neutrophils Relative % 65.8 43.0 - 77.0 %   Lymphocytes Relative 25.0 12.0 - 46.0 %   Monocytes Relative 5.4 3.0 - 12.0 %   Eosinophils Relative 2.8 0.0 - 5.0 %   Basophils Relative 1.0 0.0 - 3.0 %   Neutro Abs 4.5 1.4 - 7.7 K/uL   Lymphs Abs 1.7 0.7 - 4.0 K/uL   Monocytes Absolute 0.4 0.1 - 1.0 K/uL   Eosinophils Absolute 0.2 0.0 - 0.7 K/uL   Basophils Absolute 0.1 0.0 - 0.1 K/uL  CBC WITH DIFFERENTIAL     Status: Abnormal   Collection Time: 06/28/18 10:36 AM  Result Value Ref Range   WBC 8.2 4.0 - 10.5 K/uL   RBC 5.41 4.22 - 5.81 MIL/uL   Hemoglobin 17.2 (H) 13.0 - 17.0 g/dL   HCT 52.0 39.0 - 52.0 %  MCV 96.1 78.0 - 100.0  fL   MCH 31.8 26.0 - 34.0 pg   MCHC 33.1 30.0 - 36.0 g/dL   RDW 13.6 11.5 - 15.5 %   Platelets 272 150 - 400 K/uL   Neutrophils Relative % 52 %   Neutro Abs 4.3 1.7 - 7.7 K/uL   Lymphocytes Relative 34 %   Lymphs Abs 2.8 0.7 - 4.0 K/uL   Monocytes Relative 8 %   Monocytes Absolute 0.7 0.1 - 1.0 K/uL   Eosinophils Relative 4 %   Eosinophils Absolute 0.3 0.0 - 0.7 K/uL   Basophils Relative 1 %   Basophils Absolute 0.1 0.0 - 0.1 K/uL   Immature Granulocytes 1 %   Abs Immature Granulocytes 0.0 0.0 - 0.1 K/uL    Comment: Performed at [facility] [address], [place], [place] [address]  Comprehensive metabolic panel     Status: Abnormal   Collection Time: 06/28/18 10:36 AM  Result Value Ref Range   Sodium 138 135 - 145 mmol/L   Potassium 4.5 3.5 - 5.1 mmol/L   Chloride 103 98 - 111 mmol/L   CO2 27 22 - 32 mmol/L   Glucose, Bld 116 (H) 70 - 99 mg/dL   BUN 6 (L) 8 - 23 mg/dL   Creatinine, Ser 1.05 0.61 - 1.24 mg/dL   Calcium 9.0 8.9 - 10.3 mg/dL   Total Protein 6.4 (L) 6.5 - 8.1 g/dL   Albumin 3.8 3.5 - 5.0 g/dL   AST 17 15 - 41 U/L   ALT 18 0 - 44 U/L   Alkaline Phosphatase 86 38 - 126 U/L   Total Bilirubin 0.7 0.3 - 1.2 mg/dL   GFR calc non Af Amer >60 >60 mL/min   GFR calc Af Amer >60 >60 mL/min    Comment: (NOTE) The eGFR has been calculated using the CKD EPI equation. This calculation has not been validated in all clinical situations. eGFR's persistently <60 mL/min signify possible Chronic Kidney Disease.    Anion gap 8 5 - 15    Comment: Performed at [facility] [address], [place], [place] [address]   Assessment/Plan: 1. Apneic episode Noted by patient and daughters. Patient with risk factors of HTN, abdominal obesity, gender and age. Will arrange for home sleep study if approved by insurance.  - Home sleep test  2. Obesity (BMI 30-39.9) Sleep test ordered. Reviewed dietary and exercise recommendations. He has started getting more active and  has lost 4 lbs so far according to our scales. Will monitor. - Home sleep test  3. Tobacco dependence Declines intervention presently. Has decreased use some on his own. Will address at each subsequent visit.  4. HTN (hypertension), benign BP stable. Asymptomatic. Labs up-to-date. Continue current regimen. Follow-up June/July for CPE and MWV.   Leeanne Rio, PA-C

## 2018-08-08 ENCOUNTER — Ambulatory Visit: Payer: Medicare HMO | Admitting: Audiology

## 2018-08-08 ENCOUNTER — Ambulatory Visit (HOSPITAL_BASED_OUTPATIENT_CLINIC_OR_DEPARTMENT_OTHER): Payer: Medicare HMO | Attending: Physician Assistant | Admitting: Internal Medicine

## 2018-08-08 DIAGNOSIS — R0681 Apnea, not elsewhere classified: Secondary | ICD-10-CM

## 2018-08-08 DIAGNOSIS — E669 Obesity, unspecified: Secondary | ICD-10-CM | POA: Diagnosis not present

## 2018-08-08 DIAGNOSIS — G4733 Obstructive sleep apnea (adult) (pediatric): Secondary | ICD-10-CM | POA: Insufficient documentation

## 2018-08-13 DIAGNOSIS — E669 Obesity, unspecified: Secondary | ICD-10-CM | POA: Diagnosis not present

## 2018-08-13 DIAGNOSIS — R0681 Apnea, not elsewhere classified: Secondary | ICD-10-CM

## 2018-08-13 DIAGNOSIS — G4733 Obstructive sleep apnea (adult) (pediatric): Secondary | ICD-10-CM

## 2018-08-13 NOTE — Procedures (Signed)
    Patient Name: Cameron [name], Cameron [name] Date: 08/08/2018 Gender: Male D.O.B: July 25, 1951 Age (years): 65 Referring Provider: Brunetta Jeans PA-C Height (inches): 89 Interpreting Physician: Baird Lyons MD, ABSM Weight (lbs): 238 RPSGT: Jacolyn Reedy BMI: 32 MRN: [id number] Neck Size: 17.00  CLINICAL INFORMATION Sleep Study Type: HST Indication for sleep study: OSA Epworth Sleepiness Score: 7  SLEEP STUDY TECHNIQUE A multi-channel overnight portable sleep study was performed. The channels recorded were: nasal airflow, thoracic respiratory movement, and oxygen saturation with a pulse oximetry. Snoring was also monitored.  MEDICATIONS Patient self administered medications include: none reported.  SLEEP ARCHITECTURE Patient was studied for 349.5 minutes. The sleep efficiency was 99.1 % and the patient was supine for 18.7%. The arousal index was 0.0 per hour.  RESPIRATORY PARAMETERS The overall AHI was 1.7 per hour, with a central apnea index of 0.0 per hour. The oxygen nadir was 83% during sleep.  CARDIAC DATA Mean heart rate during sleep was 71.8 bpm.  IMPRESSIONS - No significant obstructive sleep apnea occurred during this study (AHI = 1.7/h). - No significant central sleep apnea occurred during this study (CAI = 0.0/h). - Oxygen desaturation was noted during this study (Min O2 = 83%). Mean sat 94%. - Patient snored.  DIAGNOSIS - Normal study  RECOMMENDATIONS - Be careful with alcohol, sedatives and other CNS depressants that may worsen sleep apnea and disrupt normal sleep architecture. - Sleep hygiene should be reviewed to assess factors that may improve sleep quality. - Weight management and regular exercise should be initiated or continued.  [Electronically signed] 08/13/2018 11:53 AM  Baird Lyons MD, ABSM Diplomate, American Board of Sleep Medicine   NPI: [id number]                         [place] [place]  [facility]  ELECTRONICALLY SIGNED ON:  08/13/2018, 11:51 AM [place] PH: [phone]   FX: [phone] [place]

## 2018-08-21 DIAGNOSIS — Z01818 Encounter for other preprocedural examination: Secondary | ICD-10-CM | POA: Diagnosis not present

## 2018-08-21 DIAGNOSIS — H25812 Combined forms of age-related cataract, left eye: Secondary | ICD-10-CM | POA: Diagnosis not present

## 2018-08-21 DIAGNOSIS — H25811 Combined forms of age-related cataract, right eye: Secondary | ICD-10-CM | POA: Diagnosis not present

## 2018-09-04 DIAGNOSIS — H524 Presbyopia: Secondary | ICD-10-CM | POA: Diagnosis not present

## 2018-09-04 DIAGNOSIS — H2512 Age-related nuclear cataract, left eye: Secondary | ICD-10-CM | POA: Diagnosis not present

## 2018-09-04 DIAGNOSIS — H52223 Regular astigmatism, bilateral: Secondary | ICD-10-CM | POA: Diagnosis not present

## 2018-09-04 DIAGNOSIS — H25812 Combined forms of age-related cataract, left eye: Secondary | ICD-10-CM | POA: Diagnosis not present

## 2018-09-04 DIAGNOSIS — Z961 Presence of intraocular lens: Secondary | ICD-10-CM | POA: Diagnosis not present

## 2018-09-04 DIAGNOSIS — Z9842 Cataract extraction status, left eye: Secondary | ICD-10-CM | POA: Diagnosis not present

## 2018-09-04 DIAGNOSIS — H5203 Hypermetropia, bilateral: Secondary | ICD-10-CM | POA: Diagnosis not present

## 2018-10-05 DIAGNOSIS — H2511 Age-related nuclear cataract, right eye: Secondary | ICD-10-CM | POA: Diagnosis not present

## 2018-10-05 DIAGNOSIS — H25811 Combined forms of age-related cataract, right eye: Secondary | ICD-10-CM | POA: Diagnosis not present

## 2018-10-05 DIAGNOSIS — Z961 Presence of intraocular lens: Secondary | ICD-10-CM | POA: Diagnosis not present

## 2018-10-05 DIAGNOSIS — Z9849 Cataract extraction status, unspecified eye: Secondary | ICD-10-CM | POA: Diagnosis not present

## 2018-10-12 DIAGNOSIS — H25811 Combined forms of age-related cataract, right eye: Secondary | ICD-10-CM | POA: Diagnosis not present

## 2018-11-01 ENCOUNTER — Other Ambulatory Visit: Payer: Self-pay | Admitting: Physician Assistant

## 2018-11-01 DIAGNOSIS — E782 Mixed hyperlipidemia: Secondary | ICD-10-CM

## 2018-11-01 MED ORDER — BENAZEPRIL HCL 40 MG PO TABS: 40.0000 mg | ORAL_TABLET | Freq: Every day | ORAL | 1 refills | Status: DC

## 2018-11-01 MED ORDER — ATORVASTATIN CALCIUM 20 MG PO TABS: 20.0000 mg | ORAL_TABLET | Freq: Every day | ORAL | 1 refills | Status: DC

## 2018-11-01 NOTE — Telephone Encounter (Signed)
Copied from [place] [id number]. Topic: Quick Communication - Rx Refill/Question >> Nov 01, 2018  9:28 AM Alanda Slim E wrote: Medication: atorvastatin (LIPITOR) 20 MG tablet  benazepril (LOTENSIN) 40 MG tablet  Has the patient contacted their pharmacy? No   Preferred Pharmacy (with phone number or street name): [place], [place] - [address] [phone] (Phone) [phone] (Fax)    Agent: Please be advised that RX refills may take up to 3 business days. We ask that you follow-up with your pharmacy.

## 2018-11-01 NOTE — Telephone Encounter (Signed)
Requested Prescriptions  Pending Prescriptions Disp Refills  . atorvastatin (LIPITOR) 20 MG tablet 90 tablet 1    Sig: Take 1 tablet (20 mg total) by mouth daily.     Cardiovascular:  Antilipid - Statins Failed - 11/01/2018  9:31 AM      Failed - LDL in normal range and within 360 days    LDL Cholesterol  Date Value Ref Range Status  04/03/2018 105 (H) 0 - 99 mg/dL Final         Failed - HDL in normal range and within 360 days    HDL  Date Value Ref Range Status  04/03/2018 38.50 (L) >39.00 mg/dL Final         Passed - Total Cholesterol in normal range and within 360 days    Cholesterol  Date Value Ref Range Status  04/03/2018 170 0 - 200 mg/dL Final    Comment:    ATP III Classification       Desirable:  < 200 mg/dL               Borderline High:  200 - 239 mg/dL          High:  > = 240 mg/dL         Passed - Triglycerides in normal range and within 360 days    Triglycerides  Date Value Ref Range Status  04/03/2018 134.0 0.0 - 149.0 mg/dL Final    Comment:    Normal:  <150 mg/dLBorderline High:  150 - 199 mg/dL         Passed - Patient is not pregnant      Passed - Valid encounter within last 12 months    Recent Outpatient Visits          3 months ago Apneic episode   [facility] [place], [place], [place]   5 months ago [facility] [place], [place] [facility], [place]   6 months ago HTN (hypertension), benign   [facility] [place], [place], [place]   7 months ago Prostate cancer screening   [facility] [place], [place] [facility], [place]   8 months ago HTN (hypertension), benign   [facility] [place], [place], [place]      Future Appointments            In 5 months Brunetta Jeans, PA-C [facility] [place]         . benazepril (LOTENSIN) 40 MG  tablet 90 tablet 1    Sig: Take 1 tablet (40 mg total) by mouth daily.     Cardiovascular:  ACE Inhibitors Passed - 11/01/2018  9:31 AM      Passed - Cr in normal range and within 180 days    Creatinine, Ser  Date Value Ref Range Status  06/28/2018 1.05 0.61 - 1.24 mg/dL Final         Passed - K in normal range and within 180 days    Potassium  Date Value Ref Range Status  06/28/2018 4.5 3.5 - 5.1 mmol/L Final         Passed - Patient is not pregnant      Passed - Last BP in normal range    BP Readings from Last 1 Encounters:  07/27/18 130/84         Passed - Valid encounter within last 6 months    Recent Outpatient Visits  3 months ago Apneic episode   [facility] Brunetta Jeans, [place]   5 months ago [place], [place], [place]   6 months ago HTN (hypertension), benign   [facility] [place], [place]   7 months ago Prostate cancer screening   [facility] [place], [place]   8 months ago HTN (hypertension), benign   [facility] [place], [place], [place]      Future Appointments            In 5 months Brunetta Jeans, PA-C [place], [place]

## 2019-04-06 ENCOUNTER — Encounter: Payer: Self-pay | Admitting: Physician Assistant

## 2019-04-06 ENCOUNTER — Encounter: Payer: Medicare HMO | Admitting: Physician Assistant

## 2019-04-06 ENCOUNTER — Other Ambulatory Visit: Payer: Self-pay

## 2019-04-06 ENCOUNTER — Ambulatory Visit (INDEPENDENT_AMBULATORY_CARE_PROVIDER_SITE_OTHER): Payer: Medicare HMO | Admitting: Physician Assistant

## 2019-04-06 VITALS — BP 130/86 | HR 69 | Temp 98.8°F | Resp 16 | Ht 70.0 in | Wt 235.0 lb

## 2019-04-06 DIAGNOSIS — Z122 Encounter for screening for malignant neoplasm of respiratory organs: Secondary | ICD-10-CM

## 2019-04-06 DIAGNOSIS — Z125 Encounter for screening for malignant neoplasm of prostate: Secondary | ICD-10-CM

## 2019-04-06 DIAGNOSIS — R69 Illness, unspecified: Secondary | ICD-10-CM | POA: Diagnosis not present

## 2019-04-06 DIAGNOSIS — E782 Mixed hyperlipidemia: Secondary | ICD-10-CM | POA: Diagnosis not present

## 2019-04-06 DIAGNOSIS — Z0000 Encounter for general adult medical examination without abnormal findings: Secondary | ICD-10-CM | POA: Diagnosis not present

## 2019-04-06 DIAGNOSIS — E669 Obesity, unspecified: Secondary | ICD-10-CM

## 2019-04-06 DIAGNOSIS — I10 Essential (primary) hypertension: Secondary | ICD-10-CM

## 2019-04-06 DIAGNOSIS — F17200 Nicotine dependence, unspecified, uncomplicated: Secondary | ICD-10-CM | POA: Diagnosis not present

## 2019-04-06 LAB — COMPREHENSIVE METABOLIC PANEL
ALT: 15 U/L (ref 0–53)
AST: 16 U/L (ref 0–37)
Albumin: 4.3 g/dL (ref 3.5–5.2)
Alkaline Phosphatase: 106 U/L (ref 39–117)
BUN: 7 mg/dL (ref 6–23)
CO2: 30 mEq/L (ref 19–32)
Calcium: 9.2 mg/dL (ref 8.4–10.5)
Chloride: 101 mEq/L (ref 96–112)
Creatinine, Ser: 0.92 mg/dL (ref 0.40–1.50)
GFR: 82.29 mL/min (ref >60.00)
Glucose, Bld: 109 mg/dL — ABNORMAL HIGH (ref 70–99)
Potassium: 4.2 mEq/L (ref 3.5–5.1)
Sodium: 139 mEq/L (ref 135–145)
Total Bilirubin: 0.7 mg/dL (ref 0.2–1.2)
Total Protein: 6.7 g/dL (ref 6.0–8.3)

## 2019-04-06 LAB — CBC WITH DIFFERENTIAL/PLATELET
Basophils Absolute: 0.1 10*3/uL (ref 0.0–0.1)
Basophils Relative: 0.8 % (ref 0.0–3.0)
Eosinophils Absolute: 0.3 10*3/uL (ref 0.0–0.7)
Eosinophils Relative: 3.5 % (ref 0.0–5.0)
HCT: 53 % — ABNORMAL HIGH (ref 39.0–52.0)
Hemoglobin: 17.8 g/dL — ABNORMAL HIGH (ref 13.0–17.0)
Lymphocytes Relative: 28.2 % (ref 12.0–46.0)
Lymphs Abs: 2.3 10*3/uL (ref 0.7–4.0)
MCHC: 33.6 g/dL (ref 30.0–36.0)
MCV: 94.4 fl (ref 78.0–100.0)
Monocytes Absolute: 0.4 10*3/uL (ref 0.1–1.0)
Monocytes Relative: 5.3 % (ref 3.0–12.0)
Neutro Abs: 5.1 10*3/uL (ref 1.4–7.7)
Neutrophils Relative %: 62.2 % (ref 43.0–77.0)
Platelets: 274 10*3/uL (ref 150.0–400.0)
RBC: 5.61 Mil/uL (ref 4.22–5.81)
RDW: 14.3 % (ref 11.5–15.5)
WBC: 8.2 10*3/uL (ref 4.0–10.5)

## 2019-04-06 LAB — LIPID PANEL
Cholesterol: 154 mg/dL (ref 0–200)
HDL: 36.7 mg/dL — ABNORMAL LOW (ref >39.00)
LDL Cholesterol: 94 mg/dL (ref 0–99)
NonHDL: 117.58
Total CHOL/HDL Ratio: 4
Triglycerides: 118 mg/dL (ref 0.0–149.0)
VLDL: 23.6 mg/dL (ref 0.0–40.0)

## 2019-04-06 LAB — PSA: PSA: 0.56 ng/mL (ref 0.10–4.00)

## 2019-04-06 LAB — HEMOGLOBIN A1C: Hgb A1c MFr Bld: 6.4 % (ref 4.6–6.5)

## 2019-04-06 NOTE — Patient Instructions (Signed)
Please go to the lab for blood work.   Our office will call you with your results unless you have chosen to receive results via MyChart.  If your blood work is normal we will follow-up each year for physicals and as scheduled for chronic medical problems.  If anything is abnormal we will treat accordingly and get you in for a follow-up.  I will look through the records as discussed but recommend you contact the [place] clinic in [place].   Preventive Care 66 Years and Older, Male Preventive care refers to lifestyle choices and visits with your health care provider that can promote health and wellness. This includes:  A yearly physical exam. This is also called an annual well check.  Regular dental and eye exams.  Immunizations.  Screening for certain conditions.  Healthy lifestyle choices, such as diet and exercise. What can I expect for my preventive care visit? Physical exam Your health care provider will check:  Height and weight. These may be used to calculate body mass index (BMI), which is a measurement that tells if you are at a healthy weight.  Heart rate and blood pressure.  Your skin for abnormal spots. Counseling Your health care provider may ask you questions about:  Alcohol, tobacco, and drug use.  Emotional well-being.  Home and relationship well-being.  Sexual activity.  Eating habits.  History of falls.  Memory and ability to understand (cognition).  Work and work Statistician. What immunizations do I need?  Influenza (flu) vaccine  This is recommended every year. Tetanus, diphtheria, and pertussis (Tdap) vaccine  You may need a Td booster every 10 years. Varicella (chickenpox) vaccine  You may need this vaccine if you have not already been vaccinated. Zoster (shingles) vaccine  You may need this after age 66. Pneumococcal conjugate (PCV13) vaccine  One dose is recommended after age 66. Pneumococcal polysaccharide (PPSV23) vaccine   One dose is recommended after age 66. Measles, mumps, and rubella (MMR) vaccine  You may need at least one dose of MMR if you were born in 1957 or later. You may also need a second dose. Meningococcal conjugate (MenACWY) vaccine  You may need this if you have certain conditions. Hepatitis A vaccine  You may need this if you have certain conditions or if you travel or work in places where you may be exposed to hepatitis A. Hepatitis B vaccine  You may need this if you have certain conditions or if you travel or work in places where you may be exposed to hepatitis B. Haemophilus influenzae type b (Hib) vaccine  You may need this if you have certain conditions. You may receive vaccines as individual doses or as more than one vaccine together in one shot (combination vaccines). Talk with your health care provider about the risks and benefits of combination vaccines. What tests do I need? Blood tests  Lipid and cholesterol levels. These may be checked every 5 years, or more frequently depending on your overall health.  Hepatitis C test.  Hepatitis B test. Screening  Lung cancer screening. You may have this screening every year starting at age 66 if you have a 30-pack-year history of smoking and currently smoke or have quit within the past 15 years.  Colorectal cancer screening. All adults should have this screening starting at age 66 and continuing until age 66. Your health care provider may recommend screening at age 22 if you are at increased risk. You will have tests every 1-10 years, depending on your results  and the type of screening test.  Prostate cancer screening. Recommendations will vary depending on your family history and other risks.  Diabetes screening. This is done by checking your blood sugar (glucose) after you have not eaten for a while (fasting). You may have this done every 1-3 years.  Abdominal aortic aneurysm (AAA) screening. You may need this if you are a current  or former smoker.  Sexually transmitted disease (STD) testing. Follow these instructions at home: Eating and drinking  Eat a diet that includes fresh fruits and vegetables, whole grains, lean protein, and low-fat dairy products. Limit your intake of foods with high amounts of sugar, saturated fats, and salt.  Take vitamin and mineral supplements as recommended by your health care provider.  Do not drink alcohol if your health care provider tells you not to drink.  If you drink alcohol: ? Limit how much you have to 0-2 drinks a day. ? Be aware of how much alcohol is in your drink. In the [place] one drink equals one 12 oz bottle of beer (355 mL), one 5 oz glass of wine (148 mL), or one 1 oz glass of hard liquor (44 mL). Lifestyle  Take daily care of your teeth and gums.  Stay active. Exercise for at least 30 minutes on 5 or more days each week.  Do not use any products that contain nicotine or tobacco, such as cigarettes, e-cigarettes, and chewing tobacco. If you need help quitting, ask your health care provider.  If you are sexually active, practice safe sex. Use a condom or other form of protection to prevent STIs (sexually transmitted infections).  Talk with your health care provider about taking a low-dose aspirin or statin. What's next?  Visit your health care provider once a year for a well check visit.  Ask your health care provider how often you should have your eyes and teeth checked.  Stay up to date on all vaccines. This information is not intended to replace advice given to you by your health care provider. Make sure you discuss any questions you have with your health care provider. Document Released: 10/10/2015 Document Revised: 09/07/2018 Document Reviewed: 09/07/2018 Elsevier Patient Education  2020 [employer]. .

## 2019-04-06 NOTE — Progress Notes (Signed)
Patient presents to clinic today for annual exam.  Patient is fasting for labs.  Acute Concerns: Denies acute concerns today.   Chronic Issues: Hypertension -- Is currently on a regimen of Benazepril 40 mg daily. Endorses taking medications as directed. Patient denies chest pain, palpitations, lightheadedness, dizziness, vision changes or frequent headaches.  BP Readings from Last 3 Encounters:  04/06/19 130/86  07/27/18 130/84  07/06/18 (!) 171/91   Hyperlipidemia -- Currently on Atorvastatin 20 mg daily. Endorses taking as directed. Is walking daily. Wants to start bicycling but is having a hard time finding a bike right now during [place] since they all seem to be sold out.   Tobacco Abuse -- 1 ppd at present. Is taking care of his sick father and does not feel ready to quit at this time. Denies cough or SOB. Denies hemoptysis. Has history of emphysema noted on imaging.Due for repeat lung cancer screen.   Health Maintenance: Immunizations --UTD Colonoscopy -- UTD  Past Medical History:  Diagnosis Date  . Adenomatous colon polyp 05/2009; 11/2014   2010:Tubular adenoma, no high grade dysplasia.Marland Kitchen  + Hyperplastic polyps. 2016: Tubular adenoma-rpt 5 yrs.  . Coronary artery disease   . H/O chronic gastritis 05/2009   EGD: no h.pylori,dysplasia,or evidence of malignancy  . Hyperlipidemia    lovaza from prior PMD-stopped due to cost  . Hypertension   . Obesity, Class I, BMI 30.0-34.9 (see actual BMI) 04/23/2014  . Tobacco dependence     Past Surgical History:  Procedure Laterality Date  . APPENDECTOMY  1973  . CHOLECYSTECTOMY N/A 07/06/2018   Procedure: LAPAROSCOPIC CHOLECYSTECTOMY WITH INTRAOPERATIVE CHOLANGIOGRAM;  Surgeon: Alphonsa Overall, MD;  Location: [facility];  Service: General;  Laterality: N/A;  . COLONOSCOPY  2010   [facility]  . EYE SURGERY    . STRABISMUS SURGERY  age 67 or 9    Current Outpatient Medications on File Prior to Visit  Medication Sig Dispense Refill  .  aspirin 81 MG tablet Take 81 mg by mouth daily.    Marland Kitchen atorvastatin (LIPITOR) 20 MG tablet Take 1 tablet (20 mg total) by mouth daily. 90 tablet 1  . benazepril (LOTENSIN) 40 MG tablet Take 1 tablet (40 mg total) by mouth daily. 90 tablet 1   No current facility-administered medications on file prior to visit.     No Known Allergies  Family History  Problem Relation Age of Onset  . Heart disease Mother   . COPD Father   . Hypertension Father   . Hyperlipidemia Father   . Diabetes Father   . Heart attack Father   . Dementia Father   . Alcohol abuse Maternal Grandfather     Social History   Socioeconomic History  . Marital status: Widowed    Spouse name: Not on file  . Number of children: Not on file  . Years of education: Not on file  . Highest education level: Not on file  Occupational History  . Occupation: Retired  Scientific laboratory technician  . Financial resource strain: Not on file  . Food insecurity    Worry: Not on file    Inability: Not on file  . Transportation needs    Medical: Not on file    Non-medical: Not on file  Tobacco Use  . Smoking status: Current Every Day Smoker    Packs/day: 1.00    Years: 40.00    Pack years: 40.00    Types: Cigarettes  . Smokeless tobacco: Never Used  Substance and  Sexual Activity  . Alcohol use: Yes    Comment: rare  . Drug use: No  . Sexual activity: Not on file  Lifestyle  . Physical activity    Days per week: Not on file    Minutes per session: Not on file  . Stress: Not on file  Relationships  . Social Herbalist on phone: Not on file    Gets together: Not on file    Attends religious service: Not on file    Active member of club or organization: Not on file    Attends meetings of clubs or organizations: Not on file    Relationship status: Not on file  . Intimate partner violence    Fear of current or ex partner: Not on file    Emotionally abused: Not on file    Physically abused: Not on file    Forced sexual  activity: Not on file  Other Topics Concern  . Not on file  Social History Narrative   Married, 2 daughters.   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   Orig from [place], has lived in [place] since 1970.   Tobacco 80 pack-yr hx, ongoing as of 03/2014.   Rare alcohol.  No drug use except DISTANT use of marijuana.    Review of Systems  Constitutional: Negative for fever and weight loss.  HENT: Negative for ear discharge, ear pain, hearing loss and tinnitus.   Eyes: Negative for blurred vision, double vision, photophobia and pain.  Respiratory: Negative for cough and shortness of breath.   Cardiovascular: Negative for chest pain and palpitations.  Gastrointestinal: Negative for abdominal pain, blood in stool, constipation, diarrhea, heartburn, melena, nausea and vomiting.  Genitourinary: Negative for dysuria, flank pain, frequency, hematuria and urgency.  Musculoskeletal: Negative for falls.  Neurological: Negative for dizziness, loss of consciousness and headaches.  Endo/Heme/Allergies: Negative for environmental allergies.  Psychiatric/Behavioral: Negative for depression, hallucinations, substance abuse and suicidal ideas. The patient is not nervous/anxious and does not have insomnia.     BP 130/86   Pulse 69   Temp 98.8 F (37.1 C) (Skin)   Resp 16   Ht 5\' 10"  (1.778 m)   Wt 235 lb (106.6 kg)   SpO2 98%   BMI 33.72 kg/m   Physical Exam Vitals signs reviewed.  Constitutional:      General: He is not in acute distress.    Appearance: He is well-developed. He is not diaphoretic.  HENT:     Head: Normocephalic and atraumatic.     Right Ear: Tympanic membrane, ear canal and external ear normal.     Left Ear: Tympanic membrane, ear canal and external ear normal.     Nose: Nose normal.     Mouth/Throat:     Pharynx: No posterior oropharyngeal erythema.  Eyes:     Conjunctiva/sclera: Conjunctivae normal.     Pupils: Pupils are equal, round, and reactive to light.   Neck:     Musculoskeletal: Neck supple.     Thyroid: No thyromegaly.  Cardiovascular:     Rate and Rhythm: Normal rate and regular rhythm.     Heart sounds: Normal heart sounds.  Pulmonary:     Effort: Pulmonary effort is normal. No respiratory distress.     Breath sounds: Normal breath sounds. No wheezing or rales.  Chest:     Chest wall: No tenderness.  Abdominal:     General: Bowel sounds are normal. There is no distension.  Palpations: Abdomen is soft. There is no mass.     Tenderness: There is no abdominal tenderness. There is no guarding or rebound.  Lymphadenopathy:     Cervical: No cervical adenopathy.  Skin:    General: Skin is warm and dry.     Findings: No rash.  Neurological:     Mental Status: He is alert and oriented to person, place, and time.     Cranial Nerves: No cranial nerve deficit.    Assessment/Plan: 1. Visit for preventive health examination Depression screen negative. Health Maintenance reviewed. Preventive schedule discussed and handout given in AVS. Will obtain fasting labs today. - CBC with Differential/Platelet - Comprehensive metabolic panel - Hemoglobin A1c - Lipid panel  2. Encounter for screening for malignant neoplasm of respiratory organs - CT CHEST LUNG CA SCREEN LOW DOSE W/O CM; Future  3. Obesity, Class I, BMI 30.0-34.9 (see actual BMI) Repeat labs today. Dietary and exercise recommendations reviewed with patient. Will monitor.  - Hemoglobin A1c - Lipid panel  4. Tobacco dependence Declines cessation today. Will proceed with annual low-dose CT for screening.  5. HTN (hypertension), benign BP normotensive. Asymptomatic. Continue current regimen. Repeat labs today. - Comprehensive metabolic panel - Hemoglobin A1c - Lipid panel  6. Hyperlipemia, mixed Dietary and exercise recommendations reviewed. Continue statin. Will alter regimen based on lab results.  - Comprehensive metabolic panel - Hemoglobin A1c - Lipid panel   7. Prostate cancer screening The natural history of prostate cancer and ongoing controversy regarding screening and potential treatment outcomes of prostate cancer has been discussed with the patient. The meaning of a false positive PSA and a false negative PSA has been discussed. He indicates understanding of the limitations of this screening test and wishes to proceed with screening PSA testing.  - PSA   Leeanne Rio, PA-C

## 2019-04-11 ENCOUNTER — Other Ambulatory Visit: Payer: Self-pay

## 2019-04-11 DIAGNOSIS — R7309 Other abnormal glucose: Secondary | ICD-10-CM

## 2019-04-30 ENCOUNTER — Other Ambulatory Visit: Payer: Self-pay

## 2019-04-30 ENCOUNTER — Ambulatory Visit (INDEPENDENT_AMBULATORY_CARE_PROVIDER_SITE_OTHER): Payer: Medicare HMO | Admitting: Physician Assistant

## 2019-04-30 DIAGNOSIS — R7309 Other abnormal glucose: Secondary | ICD-10-CM

## 2019-04-30 LAB — CBC WITH DIFFERENTIAL/PLATELET
Basophils Absolute: 0.1 10*3/uL (ref 0.0–0.1)
Basophils Relative: 0.7 % (ref 0.0–3.0)
Eosinophils Absolute: 0.2 10*3/uL (ref 0.0–0.7)
Eosinophils Relative: 2.7 % (ref 0.0–5.0)
HCT: 51.1 % (ref 39.0–52.0)
Hemoglobin: 17.2 g/dL — ABNORMAL HIGH (ref 13.0–17.0)
Lymphocytes Relative: 26.2 % (ref 12.0–46.0)
Lymphs Abs: 2.4 10*3/uL (ref 0.7–4.0)
MCHC: 33.6 g/dL (ref 30.0–36.0)
MCV: 95.1 fl (ref 78.0–100.0)
Monocytes Absolute: 0.6 10*3/uL (ref 0.1–1.0)
Monocytes Relative: 6.2 % (ref 3.0–12.0)
Neutro Abs: 5.8 10*3/uL (ref 1.4–7.7)
Neutrophils Relative %: 64.2 % (ref 43.0–77.0)
Platelets: 271 10*3/uL (ref 150.0–400.0)
RBC: 5.37 Mil/uL (ref 4.22–5.81)
RDW: 14.5 % (ref 11.5–15.5)
WBC: 9.1 10*3/uL (ref 4.0–10.5)

## 2019-05-01 NOTE — Progress Notes (Signed)
Repeat labs today

## 2019-05-09 NOTE — Addendum Note (Signed)
Addended by: Katina Dung on: 05/09/2019 09:25 AM   Modules accepted: Orders

## 2019-05-14 ENCOUNTER — Other Ambulatory Visit: Payer: Self-pay | Admitting: Physician Assistant

## 2019-05-14 DIAGNOSIS — E782 Mixed hyperlipidemia: Secondary | ICD-10-CM

## 2019-05-23 ENCOUNTER — Other Ambulatory Visit: Payer: Self-pay | Admitting: Physician Assistant

## 2019-05-23 ENCOUNTER — Encounter: Payer: Self-pay | Admitting: *Deleted

## 2019-05-23 DIAGNOSIS — Z122 Encounter for screening for malignant neoplasm of respiratory organs: Secondary | ICD-10-CM

## 2019-05-30 ENCOUNTER — Ambulatory Visit (HOSPITAL_BASED_OUTPATIENT_CLINIC_OR_DEPARTMENT_OTHER)
Admission: RE | Admit: 2019-05-30 | Discharge: 2019-05-30 | Disposition: A | Discharge status: Home / Self Care | Payer: Medicare HMO | Source: Ambulatory Visit | Attending: Physician Assistant | Admitting: Physician Assistant

## 2019-05-30 ENCOUNTER — Other Ambulatory Visit: Payer: Self-pay

## 2019-05-30 ENCOUNTER — Ambulatory Visit: Payer: Medicare HMO

## 2019-05-30 ENCOUNTER — Ambulatory Visit (HOSPITAL_BASED_OUTPATIENT_CLINIC_OR_DEPARTMENT_OTHER): Payer: Medicare HMO

## 2019-05-30 DIAGNOSIS — F17210 Nicotine dependence, cigarettes, uncomplicated: Secondary | ICD-10-CM | POA: Diagnosis not present

## 2019-05-30 DIAGNOSIS — I700 Atherosclerosis of aorta: Secondary | ICD-10-CM | POA: Insufficient documentation

## 2019-05-30 DIAGNOSIS — Z122 Encounter for screening for malignant neoplasm of respiratory organs: Secondary | ICD-10-CM | POA: Diagnosis not present

## 2019-05-30 DIAGNOSIS — Z87891 Personal history of nicotine dependence: Secondary | ICD-10-CM | POA: Diagnosis not present

## 2019-05-30 DIAGNOSIS — R69 Illness, unspecified: Secondary | ICD-10-CM | POA: Diagnosis not present

## 2019-05-30 DIAGNOSIS — J439 Emphysema, unspecified: Secondary | ICD-10-CM | POA: Diagnosis not present

## 2019-06-18 ENCOUNTER — Ambulatory Visit: Payer: Medicare HMO

## 2019-06-22 ENCOUNTER — Ambulatory Visit (INDEPENDENT_AMBULATORY_CARE_PROVIDER_SITE_OTHER): Payer: Medicare HMO

## 2019-06-22 ENCOUNTER — Other Ambulatory Visit: Payer: Self-pay

## 2019-06-22 DIAGNOSIS — Z23 Encounter for immunization: Secondary | ICD-10-CM

## 2019-08-13 DIAGNOSIS — R69 Illness, unspecified: Secondary | ICD-10-CM | POA: Diagnosis not present

## 2019-09-04 DIAGNOSIS — R69 Illness, unspecified: Secondary | ICD-10-CM | POA: Diagnosis not present

## 2019-11-18 ENCOUNTER — Ambulatory Visit: Payer: Medicare HMO | Attending: Internal Medicine

## 2019-11-18 DIAGNOSIS — Z23 Encounter for immunization: Secondary | ICD-10-CM | POA: Insufficient documentation

## 2019-11-18 NOTE — Progress Notes (Signed)
   Covid-19 Vaccination Clinic  Name:  Cameron [name]    MRN: [id number] DOB: [birth year]  11/18/2019  [name] was observed post Covid-19 immunization for 15 minutes without incidence. He was provided with Vaccine Information Sheet and instruction to access the V-Safe system.   [name] was instructed to call 911 with any severe reactions post vaccine: Marland Kitchen Difficulty breathing  . Swelling of your face and throat  . A fast heartbeat  . A bad rash all over your body  . Dizziness and weakness    Immunizations Administered    Name Date Dose VIS Date Route   Pfizer COVID-19 Vaccine 11/18/2019  3:05 PM 0.3 mL 09/07/2019 Intramuscular   Manufacturer: [place]   Lot: [id number]   [place]: [id number]

## 2019-11-21 ENCOUNTER — Other Ambulatory Visit: Payer: Self-pay | Admitting: Physician Assistant

## 2019-11-21 DIAGNOSIS — E782 Mixed hyperlipidemia: Secondary | ICD-10-CM

## 2019-11-21 NOTE — Telephone Encounter (Signed)
Patient is due for his 6 month follow up with Einar Pheasant

## 2019-11-28 ENCOUNTER — Ambulatory Visit (INDEPENDENT_AMBULATORY_CARE_PROVIDER_SITE_OTHER): Payer: Medicare HMO | Admitting: Physician Assistant

## 2019-11-28 ENCOUNTER — Encounter: Payer: Self-pay | Admitting: Physician Assistant

## 2019-11-28 VITALS — Ht 70.0 in | Wt 209.0 lb

## 2019-11-28 DIAGNOSIS — E782 Mixed hyperlipidemia: Secondary | ICD-10-CM

## 2019-11-28 DIAGNOSIS — F17200 Nicotine dependence, unspecified, uncomplicated: Secondary | ICD-10-CM | POA: Diagnosis not present

## 2019-11-28 DIAGNOSIS — E663 Overweight: Secondary | ICD-10-CM

## 2019-11-28 DIAGNOSIS — R69 Illness, unspecified: Secondary | ICD-10-CM | POA: Diagnosis not present

## 2019-11-28 DIAGNOSIS — I10 Essential (primary) hypertension: Secondary | ICD-10-CM

## 2019-11-28 MED ORDER — ATORVASTATIN CALCIUM 20 MG PO TABS: 20.0000 mg | ORAL_TABLET | Freq: Every day | ORAL | 1 refills | Status: DC

## 2019-11-28 MED ORDER — BENAZEPRIL HCL 40 MG PO TABS: 40.0000 mg | ORAL_TABLET | Freq: Every day | ORAL | 1 refills | Status: DC

## 2019-11-28 NOTE — Progress Notes (Signed)
I have discussed the procedure for the virtual visit with the patient who has given consent to proceed with assessment and treatment.   Reily Treloar S Reiley Keisler, CMA     

## 2019-11-28 NOTE — Progress Notes (Signed)
Virtual Visit via Video   I connected with patient on 11/28/19 at 10:00 AM EST by a video enabled telemedicine application and verified that I am speaking with the correct person using two identifiers.  Location patient: Home Location provider: [facility], Office Persons participating in the virtual visit: Patient, Provider, [place] (Patina Moore)  I discussed the limitations of evaluation and management by telemedicine and the availability of in person appointments. The patient expressed understanding and agreed to proceed.  Subjective:   HPI:   Patient presents via doxy.need today for follow-up of chronic medical conditions.  Patient with history of hypertension, hyperlipidemia, obesity and tobacco use.  Patient is currently on a regimen of benazepril 40 mg daily, atorvastatin 20 mg daily and ASA 81 mg daily.  Patient endorses taking medications as directed.  Has made significant changes to his diet, cutting out breads, lowering portion sizes, no late night eating and no snacking.  In regards to exercise, patient reports trying to walk throughout the day, every day. Continues to smoke 1 ppd. Has a lot going on including the recent death of his father so he is not in a place where he feels ready to quit. Patient denies chest pain, palpitations, lightheadedness, dizziness, vision changes or frequent headaches.   ROS:   See pertinent positives and negatives per HPI.  Patient Active Problem List   Diagnosis Date Noted  . Need for hepatitis C screening test 04/26/2018  . Centrilobular emphysema ([place]) 04/20/2018  . Polycythemia 04/20/2018  . CAD (coronary artery disease) 03/31/2018  . Visit for preventive health examination 02/27/2018  . Obesity, Class I, BMI 30.0-34.9 (see actual BMI) 04/23/2014  . Tobacco dependence 12/29/2012  . Health maintenance examination 03/13/2012  . Hyperlipemia, mixed 03/13/2012  . HTN (hypertension), benign 02/11/2012    Social History   Tobacco  Use  . Smoking status: Current Every Day Smoker    Packs/day: 1.00    Years: 40.00    Pack years: 40.00    Types: Cigarettes  . Smokeless tobacco: Never Used  Substance Use Topics  . Alcohol use: Yes    Comment: rare    Current Outpatient Medications:  .  aspirin 81 MG tablet, Take 81 mg by mouth daily., Disp: , Rfl:  .  atorvastatin (LIPITOR) 20 MG tablet, Take 1 tablet by mouth once daily, Disp: 90 tablet, Rfl: 1 .  benazepril (LOTENSIN) 40 MG tablet, Take 1 tablet by mouth once daily, Disp: 90 tablet, Rfl: 1  No Known Allergies  Objective:   Wt 209 lb (94.8 kg)   BMI 29.99 kg/m   Patient is well-developed, well-nourished in no acute distress.  Resting comfortably at home.  Head is normocephalic, atraumatic.  No labored breathing.  Speech is clear and coherent with logical content.  Patient is alert and oriented at baseline.   Assessment and Plan:   1. HTN (hypertension), benign Patient taking medication as directed.  Has lost 40+ pounds since last visit.  Recommend he check blood pressure at home at least 3 times weekly over the next 2 weeks and record.  He is to call or message me with these recordings so we can determine if medication dose needs to be decreased.  We will plan on repeat labs at his scheduled physical. - benazepril (LOTENSIN) 40 MG tablet; Take 1 tablet (40 mg total) by mouth daily.  Dispense: 90 tablet; Refill: 1  2. Hyperlipemia, mixed Taking medication as directed.  Has made significant changes to diet and exercise  with 40+ pound weight loss.  We will continue statin for now.  Will reassess fasting lipid levels at his visit: And adjust dose accordingly. - atorvastatin (LIPITOR) 20 MG tablet; Take 1 tablet (20 mg total) by mouth daily.  Dispense: 90 tablet; Refill: 1  3. Obesity, Class I, BMI 30.0-34.9 (see actual BMI) Has made great strides with diet and exercise culminating in significant weight loss.  Now down to a BMI of 29.99 -we will change this  in chart.  Patient is congratulated on this.  Will monitor.  4. Tobacco dependence Continues to smoke 1 pack/day.  With the recent loss of his father he does not feel ready to discuss cessation at this time.  We will reassess at his physical this summer.    Leeanne Rio, [place] 11/28/2019

## 2019-12-03 ENCOUNTER — Telehealth: Payer: Self-pay | Admitting: Physician Assistant

## 2019-12-03 DIAGNOSIS — I10 Essential (primary) hypertension: Secondary | ICD-10-CM

## 2019-12-03 MED ORDER — HYDROCHLOROTHIAZIDE 12.5 MG PO CAPS: 12.5000 mg | ORAL_CAPSULE | Freq: Every day | ORAL | 1 refills | Status: DC

## 2019-12-03 NOTE — Telephone Encounter (Signed)
Pt called in stating that his bp has been running high, this morning when he took it 208/133 (before meds) then he took it it was 193/112, the last time he took it it was 176/107 about 30-55min after he took his medications.  Pt took it while on the phone and it was 180/107 and a pulse of 84  Please advise   He did say that he has been under a lot of stress in the last few mths.

## 2019-12-03 NOTE — Telephone Encounter (Signed)
Spoke with patient about his elevated blood pressures. He states he has not been sleeping well, having family issues, father passed away 2 weeks ago and increased stress. His last BP was 160/93 P 100. He takes his Benazapril daily about 8-8:30 am. Advised patient to stop by the office to check BP and make sure blood pressure cuff is accurate. He is agreeable with stopping by the office and starting the HCTZ 12.5 mg. Rx sent to the pharmacy. He will start tomorrow.  He denied any chest pain, sob, lightheaded or dizziness.

## 2019-12-03 NOTE — Telephone Encounter (Signed)
Patient was not noting any symptoms of high blood pressure at time of visit.  Did endorse taking his medications as directed.  It was requested him to check blood pressure daily for few days so we could see how blood pressures are running.  At this point I would recommend we verify that his blood pressure cuff is accurate.  Think it would be a good idea to have him come into the office, bringing his BP cuff with him.  This way we can check blood pressure with his cuff and manually at the same time to ensure his cuff is accurate.  In the meantime, continue the benazepril. Will have him start HCTZ 12.5 mg daily. Ok to send in 30 with 1 refill.  If developing any chest pain, SOB, lightheadedness or dizziness before his assessment will need ER assessment.

## 2019-12-03 NOTE — Telephone Encounter (Signed)
Any changes to medication? 

## 2019-12-04 ENCOUNTER — Other Ambulatory Visit: Payer: Self-pay

## 2019-12-04 ENCOUNTER — Ambulatory Visit: Payer: Medicare HMO | Admitting: Emergency Medicine

## 2019-12-04 VITALS — BP 152/91 | HR 81

## 2019-12-04 DIAGNOSIS — I10 Essential (primary) hypertension: Secondary | ICD-10-CM

## 2019-12-04 NOTE — Progress Notes (Signed)
PCP recommendations to check his home blood pressures with his girlfriends blood pressure monitor. It seemed to be more accurate. Continue with taking HCTZ 12.5 mg as directed and keep check of blood pressure daily. And follow up in 2 weeks with blood pressures. He is agreeable.   Patient here to compare his blood monitor to ours to make sure it is accurate. When checking patient blood pressure manually his blood pressure was 142/78 When checking patient blood pressure with his automatic machine his blood pressure was 152/91. He states the blood pressure monitor is less than 82 years old.  Patient has not started the HCTZ 12.5 mg yet. He is on his way to pick up medication.

## 2019-12-12 ENCOUNTER — Ambulatory Visit: Payer: Medicare HMO | Attending: Internal Medicine

## 2019-12-12 DIAGNOSIS — Z23 Encounter for immunization: Secondary | ICD-10-CM

## 2019-12-12 NOTE — Progress Notes (Signed)
   Covid-19 Vaccination Clinic  Name:  AADITH PRIDMORE    MRN: [id number] DOB: 1951/07/11  12/12/2019  Mr. Kotas was observed post Covid-19 immunization for 15 minutes without incident. He was provided with Vaccine Information Sheet and instruction to access the V-Safe system.   Mr. Kush was instructed to call 911 with any severe reactions post vaccine: Marland Kitchen Difficulty breathing  . Swelling of face and throat  . A fast heartbeat  . A bad rash all over body  . Dizziness and weakness   Immunizations Administered    Name Date Dose VIS Date Route   Pfizer COVID-19 Vaccine 12/12/2019  3:03 PM 0.3 mL 09/07/2019 Intramuscular   Manufacturer: [place]   Lot: [id number]   [place]: [id number]

## 2020-02-06 ENCOUNTER — Ambulatory Visit (INDEPENDENT_AMBULATORY_CARE_PROVIDER_SITE_OTHER): Payer: Medicare HMO

## 2020-02-06 VITALS — Ht 70.0 in | Wt 206.0 lb

## 2020-02-06 DIAGNOSIS — E782 Mixed hyperlipidemia: Secondary | ICD-10-CM

## 2020-02-06 DIAGNOSIS — Z0000 Encounter for general adult medical examination without abnormal findings: Secondary | ICD-10-CM | POA: Diagnosis not present

## 2020-02-06 DIAGNOSIS — I10 Essential (primary) hypertension: Secondary | ICD-10-CM

## 2020-02-06 MED ORDER — ATORVASTATIN CALCIUM 20 MG PO TABS: 20.0000 mg | ORAL_TABLET | Freq: Every day | ORAL | 0 refills | Status: DC

## 2020-02-06 MED ORDER — HYDROCHLOROTHIAZIDE 12.5 MG PO CAPS: 12.5000 mg | ORAL_CAPSULE | Freq: Every day | ORAL | 0 refills | Status: DC

## 2020-02-06 MED ORDER — BENAZEPRIL HCL 40 MG PO TABS: 40.0000 mg | ORAL_TABLET | Freq: Every day | ORAL | 0 refills | Status: DC

## 2020-02-06 NOTE — Progress Notes (Addendum)
This visit is being conducted via phone call due to the COVID-19 pandemic. This patient has given me verbal consent via phone to conduct this visit, patient states they are participating from their home address. Some vital signs may be absent or patient reported.   Patient identification: identified by name, DOB, and current address.  Location provider: [facility] Persons participating in the virtual visit: Denman George LPN, patient, and Elyn Aquas PA   Subjective:   Cameron [name] is a 69 y.o. male who presents for an Initial Medicare Annual Wellness Visit.  Review of Systems   Cardiac Risk Factors include: advanced age (>82men, >63 women);male gender;hypertension;smoking/ tobacco exposure  Objective:    Today's Vitals   02/06/20 1238  Weight: 206 lb (93.4 kg)  Height: 5\' 10"  (D34-534 m)   Body mass index is 29.56 kg/m.  Advanced Directives 02/06/2020 06/28/2018 04/03/2018  Does Patient Have a Medical Advance Directive? Yes No No  Type of Advance Directive Living will;Healthcare Power of Attorney - -  Does patient want to make changes to medical advance directive? No - Patient declined No - Patient declined -  Copy of [place] in Chart? No - copy requested - -  Would patient like information on creating a medical advance directive? - - Yes (MAU/Ambulatory/Procedural Areas - Information given)    Current Medications (verified) Outpatient Encounter Medications as of 02/06/2020  Medication Sig  . aspirin 81 MG tablet Take 81 mg by mouth daily.  Marland Kitchen atorvastatin (LIPITOR) 20 MG tablet Take 1 tablet (20 mg total) by mouth daily.  . benazepril (LOTENSIN) 40 MG tablet Take 1 tablet (40 mg total) by mouth daily.  . hydrochlorothiazide (MICROZIDE) 12.5 MG capsule Take 1 capsule (12.5 mg total) by mouth daily.  . [DISCONTINUED] atorvastatin (LIPITOR) 20 MG tablet Take 1 tablet (20 mg total) by mouth daily.  . [DISCONTINUED] benazepril (LOTENSIN) 40 MG  tablet Take 1 tablet (40 mg total) by mouth daily.  . [DISCONTINUED] hydrochlorothiazide (MICROZIDE) 12.5 MG capsule Take 1 capsule (12.5 mg total) by mouth daily.   No facility-administered encounter medications on file as of 02/06/2020.    Allergies (verified) Patient has no known allergies.   History: Past Medical History:  Diagnosis Date  . Adenomatous colon polyp 05/2009; 11/2014   2010:Tubular adenoma, no high grade dysplasia.Marland Kitchen  + Hyperplastic polyps. 2016: Tubular adenoma-rpt 5 yrs.  . Coronary artery disease   . H/O chronic gastritis 05/2009   EGD: no h.pylori,dysplasia,or evidence of malignancy  . Hyperlipidemia    lovaza from prior PMD-stopped due to cost  . Hypertension   . Obesity, Class I, BMI 30.0-34.9 (see actual BMI) 04/23/2014  . Tobacco dependence    Past Surgical History:  Procedure Laterality Date  . APPENDECTOMY  1973  . CHOLECYSTECTOMY N/A 07/06/2018   Procedure: LAPAROSCOPIC CHOLECYSTECTOMY WITH INTRAOPERATIVE CHOLANGIOGRAM;  Surgeon: Alphonsa Overall, MD;  Location: [facility];  Service: General;  Laterality: N/A;  . COLONOSCOPY  2010   [facility]  . EYE SURGERY    . STRABISMUS SURGERY  age 64 or 49   Family History  Problem Relation Age of Onset  . Heart disease Mother   . COPD Father   . Hypertension Father   . Hyperlipidemia Father   . Diabetes Father   . Heart attack Father   . Dementia Father   . Alcohol abuse Maternal Grandfather    Social History   Socioeconomic History  . Marital status: Widowed  Spouse name: Not on file  . Number of children: 2  . Years of education: Not on file  . Highest education level: Not on file  Occupational History  . 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 Smoking status: Current Every Day Smoker    Packs/day: 1.00    Years: 40.00    Pack years: 40.00    Types: Cigarettes  . Smokeless tobacco: Never Used  Substance and Sexual Activity  . Alcohol use:  Yes    Comment: rare  . Drug use: No  . Sexual activity: Not on file  Other Topics Concern  . Not on file  Social History Narrative   Widowed, 2 daughters.   Works with entertainment companies to set up venues    Orig from [place], has lived in [place] since 1970.   Tobacco 80 pack-yr hx, ongoing as of 03/2014.   Rare alcohol.  No drug use except DISTANT use of marijuana.   Social Determinants of Health   Financial Resource Strain:   . Difficulty of Paying Living Expenses:   Food Insecurity:   . Worried About Charity fundraiser in the Last Year:   . Arboriculturist in the Last Year:   Transportation Needs:   . Film/video editor (Medical):   Marland Kitchen Lack of Transportation (Non-Medical):   Physical Activity:   . Days of Exercise per Week:   . Minutes of Exercise per Session:   Stress:   . Feeling of Stress :   Social Connections:   . Frequency of Communication with Friends and Family:   . Frequency of Social Gatherings with Friends and Family:   . Attends Religious Services:   . Active Member of Clubs or Organizations:   . Attends Archivist Meetings:   Marland Kitchen Marital Status:    Tobacco Counseling Ready to quit: No Counseling given: Not Answered   Clinical Intake:  Pre-visit preparation completed: Yes  Pain : No/denies pain  Diabetes: No  How often do you need to have someone help you when you read instructions, pamphlets, or other written materials from your doctor or pharmacy?: 1 - Never  Interpreter Needed?: No  Information entered by :: Denman George LPN  Activities of Daily Living In your present state of health, do you have any difficulty performing the following activities: 02/06/2020 04/06/2019  Hearing? N N  Vision? N N  Difficulty concentrating or making decisions? N N  Walking or climbing stairs? N N  Dressing or bathing? N N  Doing errands, shopping? N N  Preparing Food and eating ? N -  Using the Toilet? N -  In the past six months, have  you accidently leaked urine? N -  Do you have problems with loss of bowel control? N -  Managing your Medications? N -  Managing your Finances? N -  Housekeeping or managing your Housekeeping? N -  Some recent data might be hidden     Immunizations and Health Maintenance Immunization History  Administered Date(s) Administered  . Fluad Quad(high Dose 65+) 06/22/2019  . Influenza,inj,Quad PF,6+ Mos 05/22/2018  . PFIZER SARS-COV-2 Vaccination 11/18/2019, 12/12/2019  . Pneumococcal Polysaccharide-23 04/20/2018  . Tdap 02/11/2012   Health Maintenance Due  Topic Date Due  . COLONOSCOPY  12/06/2019    Patient Care Team: Delorse Limber as PCP - General (Family Medicine) Druscilla Brownie, MD as Consulting Physician (Dermatology) Wonda Horner,  MD as Consulting Physician (Gastroenterology)  Indicate any recent Medical Services you may have received from other than [facility] providers in the past year (date may be approximate).    Assessment:   This is a routine wellness examination for [employer].  Hearing/Vision screen No exam data present  Dietary issues and exercise activities discussed: Current Exercise Habits: Home exercise routine, Type of exercise: walking;Other - see comments(yardwork/ outside actitvities), Time (Minutes): 30, Frequency (Times/Week): 5, Weekly Exercise (Minutes/Week): 150, Intensity: Mild  Goals   None    Depression Screen PHQ 2/9 Scores 02/06/2020 04/06/2019 04/03/2018 03/09/2017  PHQ - 2 Score 0 2 0 0  PHQ- 9 Score - 4 - -    Fall Risk Fall Risk  02/06/2020 11/28/2019 04/03/2018 03/09/2017  Falls in the past year? 0 0 No No  Number falls in past yr: 0 0 - -  Injury with Fall? 0 0 - -  Follow up Falls evaluation completed;Education provided;Falls prevention discussed Falls evaluation completed - -    Is the patient's home free of loose throw rugs in walkways, pet beds, electrical cords, etc?   yes      Grab bars in the bathroom? yes      Handrails on the  stairs?   yes      Adequate lighting?   yes   Cognitive Function: no cognitive concerns at this time  MMSE - Mini Mental State Exam 04/03/2018  Orientation to time 5  Orientation to Place 5  Registration 3  Attention/ Calculation 5  Recall 2  Language- name 2 objects 2  Language- repeat 1  Language- follow 3 step command 3  Language- read & follow direction 1  Write a sentence 1  Copy design 1  Total score 29     6CIT Screen 02/06/2020  What Year? 0 points  What month? 0 points  What time? 0 points  Count back from 20 0 points  Months in reverse 0 points  Repeat phrase 0 points  Total Score 0    Screening Tests Health Maintenance  Topic Date Due  . COLONOSCOPY  12/06/2019  . PNA vac Low Risk Adult (2 of 2 - PCV13) 04/26/2020 (Originally 04/21/2019)  . DTAP VACCINES (1) 02/23/2021 (Originally 05/11/1951)  . INFLUENZA VACCINE  04/27/2020  . DTaP/Tdap/Td (2 - Td) 02/10/2022  . TETANUS/TDAP  02/10/2022  . COVID-19 Vaccine  Completed  . Hepatitis C Screening  Completed    Qualifies for Shingles Vaccine? Discussed and patient will check with pharmacy for coverage.  Patient education handout provided   Cancer Screenings: Lung: Low Dose CT Chest recommended if Age 85-80 years, 30 pack-year currently smoking OR have quit w/in 15years. Patient does qualify. Last CT 05/30/19 Colorectal: colonoscopy 12/06/14; patient would like to defer at this time     Plan:  I have personally reviewed and addressed the Medicare Annual Wellness questionnaire and have noted the following in the patient's chart:  A. Medical and social history B. Use of alcohol, tobacco or illicit drugs  C. Current medications and supplements D. Functional ability and status E.  Nutritional status F.  Physical activity G. Advance directives H. List of other physicians I.  Hospitalizations, surgeries, and ER visits in previous 12 months J.  [place] such as hearing and vision if needed, cognitive and  depression L. Referrals, records requested, and appointments- none   In addition, I have reviewed and discussed with patient certain preventive protocols, quality metrics, and best practice recommendations. A written  personalized care plan for preventive services as well as general preventive health recommendations were provided to patient.   Signed,  Denman George, LPN  Nurse Health Advisor   Nurse Notes: Patient requesting that rx be sent to mail order pharmacy. Prescriptions sent as requested and patient scheduled for physical in July.

## 2020-02-06 NOTE — Patient Instructions (Addendum)
Cameron [name] , Thank you for taking time to come for your Medicare Wellness Visit. I appreciate your ongoing commitment to your health goals. Please review the following plan we discussed and let me know if I can assist you in the future.   Screening recommendations/referrals: Colorectal Screening: last colonoscopy 12/06/14; repeat recommended 11/2019  Vision and Dental Exams: Recommended annual ophthalmology exams for early detection of glaucoma and other disorders of the eye Recommended annual dental exams for proper oral hygiene  Vaccinations: Influenza vaccine: up to date; last 06/22/19 Pneumococcal vaccine: Prevnar recommended at next office visit  Tdap vaccine: up to date; last 02/11/12  Shingles vaccine:  You may receive this vaccine at your local pharmacy. (see handout)  Covid vaccine: completed   Advanced directives: Please bring a copy of your POA (Power of Attorney) and/or Living Will to your next appointment.  Goals: Recommend to drink at least 6-8 8oz glasses of water per day and consume a balanced diet rich in fresh fruits and vegetables.   Next appointment: Please schedule your Annual Wellness Visit with your Nurse Health Advisor in one year.  Preventive Care 67 Years and Older, Male Preventive care refers to lifestyle choices and visits with your health care provider that can promote health and wellness. What does preventive care include?  A yearly physical exam. This is also called an annual well check.  Dental exams once or twice a year.  Routine eye exams. Ask your health care provider how often you should have your eyes checked.  Personal lifestyle choices, including:  Daily care of your teeth and gums.  Regular physical activity.  Eating a healthy diet.  Avoiding tobacco and drug use.  Limiting alcohol use.  Practicing safe sex.  Taking low doses of aspirin every day if recommended by your health care provider..  Taking vitamin and mineral supplements as  recommended by your health care provider. What happens during an annual well check? The services and screenings done by your health care provider during your annual well check will depend on your age, overall health, lifestyle risk factors, and family history of disease. Counseling  Your health care provider may ask you questions about your:  Alcohol use.  Tobacco use.  Drug use.  Emotional well-being.  Home and relationship well-being.  Sexual activity.  Eating habits.  History of falls.  Memory and ability to understand (cognition).  Work and work Statistician. Screening  You may have the following tests or measurements:  Height, weight, and BMI.  Blood pressure.  Lipid and cholesterol levels. These may be checked every 5 years, or more frequently if you are over 41 years old.  Skin check.  Lung cancer screening. You may have this screening every year starting at age 74 if you have a 30-pack-year history of smoking and currently smoke or have quit within the past 15 years.  Fecal occult blood test (FOBT) of the stool. You may have this test every year starting at age 48.  Flexible sigmoidoscopy or colonoscopy. You may have a sigmoidoscopy every 5 years or a colonoscopy every 10 years starting at age 69.  Prostate cancer screening. Recommendations will vary depending on your family history and other risks.  Hepatitis C blood test.  Hepatitis B blood test.  Sexually transmitted disease (STD) testing.  Diabetes screening. This is done by checking your blood sugar (glucose) after you have not eaten for a while (fasting). You may have this done every 1-3 years.  Abdominal aortic aneurysm (AAA) screening.  You may need this if you are a current or former smoker.  Osteoporosis. You may be screened starting at age 61 if you are at high risk. Talk with your health care provider about your test results, treatment options, and if necessary, the need for more tests.  Vaccines  Your health care provider may recommend certain vaccines, such as:  Influenza vaccine. This is recommended every year.  Tetanus, diphtheria, and acellular pertussis (Tdap, Td) vaccine. You may need a Td booster every 10 years.  Zoster vaccine. You may need this after age 35.  Pneumococcal 13-valent conjugate (PCV13) vaccine. One dose is recommended after age 39.  Pneumococcal polysaccharide (PPSV23) vaccine. One dose is recommended after age 32. Talk to your health care provider about which screenings and vaccines you need and how often you need them. This information is not intended to replace advice given to you by your health care provider. Make sure you discuss any questions you have with your health care provider. Document Released: 10/10/2015 Document Revised: 06/02/2016 Document Reviewed: 07/15/2015 Elsevier Interactive Patient Education  2017 [employer] Prevention in the Home Falls can cause injuries. They can happen to people of all ages. There are many things you can do to make your home safe and to help prevent falls. What can I do on the outside of my home?  Regularly fix the edges of walkways and driveways and fix any cracks.  Remove anything that might make you trip as you walk through a door, such as a raised step or threshold.  Trim any bushes or trees on the path to your home.  Use bright outdoor lighting.  Clear any walking paths of anything that might make someone trip, such as rocks or tools.  Regularly check to see if handrails are loose or broken. Make sure that both sides of any steps have handrails.  Any raised decks and porches should have guardrails on the edges.  Have any leaves, snow, or ice cleared regularly.  Use sand or salt on walking paths during winter.  Clean up any spills in your garage right away. This includes oil or grease spills. What can I do in the bathroom?  Use night lights.  Install grab bars by the toilet  and in the tub and shower. Do not use towel bars as grab bars.  Use non-skid mats or decals in the tub or shower.  If you need to sit down in the shower, use a plastic, non-slip stool.  Keep the floor dry. Clean up any water that spills on the floor as soon as it happens.  Remove soap buildup in the tub or shower regularly.  Attach bath mats securely with double-sided non-slip rug tape.  Do not have throw rugs and other things on the floor that can make you trip. What can I do in the bedroom?  Use night lights.  Make sure that you have a light by your bed that is easy to reach.  Do not use any sheets or blankets that are too big for your bed. They should not hang down onto the floor.  Have a firm chair that has side arms. You can use this for support while you get dressed.  Do not have throw rugs and other things on the floor that can make you trip. What can I do in the kitchen?  Clean up any spills right away.  Avoid walking on wet floors.  Keep items that you use a lot in easy-to-reach places.  If you need to reach something above you, use a strong step stool that has a grab bar.  Keep electrical cords out of the way.  Do not use floor polish or wax that makes floors slippery. If you must use wax, use non-skid floor wax.  Do not have throw rugs and other things on the floor that can make you trip. What can I do with my stairs?  Do not leave any items on the stairs.  Make sure that there are handrails on both sides of the stairs and use them. Fix handrails that are broken or loose. Make sure that handrails are as long as the stairways.  Check any carpeting to make sure that it is firmly attached to the stairs. Fix any carpet that is loose or worn.  Avoid having throw rugs at the top or bottom of the stairs. If you do have throw rugs, attach them to the floor with carpet tape.  Make sure that you have a light switch at the top of the stairs and the bottom of the  stairs. If you do not have them, ask someone to add them for you. What else can I do to help prevent falls?  Wear shoes that:  Do not have high heels.  Have rubber bottoms.  Are comfortable and fit you well.  Are closed at the toe. Do not wear sandals.  If you use a stepladder:  Make sure that it is fully opened. Do not climb a closed stepladder.  Make sure that both sides of the stepladder are locked into place.  Ask someone to hold it for you, if possible.  Clearly [place] and make sure that you can see:  Any grab bars or handrails.  First and last steps.  Where the edge of each step is.  Use tools that help you move around (mobility aids) if they are needed. These include:  Canes.  Walkers.  Scooters.  Crutches.  Turn on the lights when you go into a dark area. Replace any light bulbs as soon as they burn out.  Set up your furniture so you have a clear path. Avoid moving your furniture around.  If any of your floors are uneven, fix them.  If there are any pets around you, be aware of where they are.  Review your medicines with your doctor. Some medicines can make you feel dizzy. This can increase your chance of falling. Ask your doctor what other things that you can do to help prevent falls. This information is not intended to replace advice given to you by your health care provider. Make sure you discuss any questions you have with your health care provider. Document Released: 07/10/2009 Document Revised: 02/19/2016 Document Reviewed: 10/18/2014 Elsevier Interactive Patient Education  2017 [employer].

## 2020-04-08 ENCOUNTER — Other Ambulatory Visit: Payer: Self-pay

## 2020-04-08 ENCOUNTER — Ambulatory Visit (INDEPENDENT_AMBULATORY_CARE_PROVIDER_SITE_OTHER): Payer: Medicare HMO | Admitting: Physician Assistant

## 2020-04-08 ENCOUNTER — Encounter: Payer: Self-pay | Admitting: Physician Assistant

## 2020-04-08 VITALS — BP 126/78 | HR 73 | Temp 98.7°F | Resp 16 | Ht 70.0 in | Wt 207.0 lb

## 2020-04-08 DIAGNOSIS — Z125 Encounter for screening for malignant neoplasm of prostate: Secondary | ICD-10-CM

## 2020-04-08 DIAGNOSIS — D751 Secondary polycythemia: Secondary | ICD-10-CM

## 2020-04-08 DIAGNOSIS — J432 Centrilobular emphysema: Secondary | ICD-10-CM | POA: Diagnosis not present

## 2020-04-08 DIAGNOSIS — F17200 Nicotine dependence, unspecified, uncomplicated: Secondary | ICD-10-CM

## 2020-04-08 DIAGNOSIS — Z0000 Encounter for general adult medical examination without abnormal findings: Secondary | ICD-10-CM | POA: Diagnosis not present

## 2020-04-08 DIAGNOSIS — D582 Other hemoglobinopathies: Secondary | ICD-10-CM | POA: Insufficient documentation

## 2020-04-08 DIAGNOSIS — E663 Overweight: Secondary | ICD-10-CM

## 2020-04-08 DIAGNOSIS — R69 Illness, unspecified: Secondary | ICD-10-CM | POA: Diagnosis not present

## 2020-04-08 DIAGNOSIS — I10 Essential (primary) hypertension: Secondary | ICD-10-CM | POA: Diagnosis not present

## 2020-04-08 LAB — COMPREHENSIVE METABOLIC PANEL
ALT: 14 U/L (ref 0–53)
AST: 15 U/L (ref 0–37)
Albumin: 4.3 g/dL (ref 3.5–5.2)
Alkaline Phosphatase: 90 U/L (ref 39–117)
BUN: 11 mg/dL (ref 6–23)
CO2: 30 mEq/L (ref 19–32)
Calcium: 9.3 mg/dL (ref 8.4–10.5)
Chloride: 96 mEq/L (ref 96–112)
Creatinine, Ser: 0.9 mg/dL (ref 0.40–1.50)
GFR: 84.15 mL/min (ref >60.00)
Glucose, Bld: 124 mg/dL — ABNORMAL HIGH (ref 70–99)
Potassium: 4.1 mEq/L (ref 3.5–5.1)
Sodium: 134 mEq/L — ABNORMAL LOW (ref 135–145)
Total Bilirubin: 0.8 mg/dL (ref 0.2–1.2)
Total Protein: 6.5 g/dL (ref 6.0–8.3)

## 2020-04-08 LAB — CBC WITH DIFFERENTIAL/PLATELET
Basophils Absolute: 0.1 10*3/uL (ref 0.0–0.1)
Basophils Relative: 0.9 % (ref 0.0–3.0)
Eosinophils Absolute: 0.3 10*3/uL (ref 0.0–0.7)
Eosinophils Relative: 3.5 % (ref 0.0–5.0)
HCT: 50.6 % (ref 39.0–52.0)
Hemoglobin: 17.6 g/dL — ABNORMAL HIGH (ref 13.0–17.0)
Lymphocytes Relative: 25.1 % (ref 12.0–46.0)
Lymphs Abs: 2.1 10*3/uL (ref 0.7–4.0)
MCHC: 34.7 g/dL (ref 30.0–36.0)
MCV: 93.8 fl (ref 78.0–100.0)
Monocytes Absolute: 0.7 10*3/uL (ref 0.1–1.0)
Monocytes Relative: 8.4 % (ref 3.0–12.0)
Neutro Abs: 5.2 10*3/uL (ref 1.4–7.7)
Neutrophils Relative %: 62.1 % (ref 43.0–77.0)
Platelets: 274 10*3/uL (ref 150.0–400.0)
RBC: 5.39 Mil/uL (ref 4.22–5.81)
RDW: 14.3 % (ref 11.5–15.5)
WBC: 8.4 10*3/uL (ref 4.0–10.5)

## 2020-04-08 LAB — PSA, MEDICARE: PSA: 0.86 ng/ml (ref 0.10–4.00)

## 2020-04-08 LAB — LIPID PANEL
Cholesterol: 162 mg/dL (ref 0–200)
HDL: 45.1 mg/dL (ref >39.00)
LDL Cholesterol: 89 mg/dL (ref 0–99)
NonHDL: 117.03
Total CHOL/HDL Ratio: 4
Triglycerides: 138 mg/dL (ref 0.0–149.0)
VLDL: 27.6 mg/dL (ref 0.0–40.0)

## 2020-04-08 NOTE — Progress Notes (Signed)
Patient presents to clinic today for annual exam.  Patient is fasting for labs.  Patient is up-to-date on his Medicare wellness visit, having with our health coach in May of this year.  Health Maintenance: Immunizations --up-to-date Colonoscopy --repeat colonoscopy scheduled for 04/30/2020 due to history of polyps  Past Medical History:  Diagnosis Date  . Adenomatous colon polyp 05/2009; 11/2014   2010:Tubular adenoma, no high grade dysplasia.Marland Kitchen  + Hyperplastic polyps. 2016: Tubular adenoma-rpt 5 yrs.  . Coronary artery disease   . H/O chronic gastritis 05/2009   EGD: no h.pylori,dysplasia,or evidence of malignancy  . Hyperlipidemia    lovaza from prior PMD-stopped due to cost  . Hypertension   . Obesity, Class I, BMI 30.0-34.9 (see actual BMI) 04/23/2014  . Tobacco dependence     Past Surgical History:  Procedure Laterality Date  . APPENDECTOMY  1973  . CHOLECYSTECTOMY N/A 07/06/2018   Procedure: LAPAROSCOPIC CHOLECYSTECTOMY WITH INTRAOPERATIVE CHOLANGIOGRAM;  Surgeon: Alphonsa Overall, MD;  Location: [place];  Service: General;  Laterality: N/A;  . COLONOSCOPY  2010   [facility]  . EYE SURGERY    . STRABISMUS SURGERY  age 42 or 9    Current Outpatient Medications on File Prior to Visit  Medication Sig Dispense Refill  . aspirin 81 MG tablet Take 81 mg by mouth daily.    Marland Kitchen atorvastatin (LIPITOR) 20 MG tablet Take 1 tablet (20 mg total) by mouth daily. 90 tablet 0  . benazepril (LOTENSIN) 40 MG tablet Take 1 tablet (40 mg total) by mouth daily. 90 tablet 0  . hydrochlorothiazide (MICROZIDE) 12.5 MG capsule Take 1 capsule (12.5 mg total) by mouth daily. 90 capsule 0   No current facility-administered medications on file prior to visit.    No Known Allergies  Family History  Problem Relation Age of Onset  . Heart disease Mother   . COPD Father   . Hypertension Father   . Hyperlipidemia Father   . Diabetes Father   . Heart attack Father   . Dementia Father   . Alcohol abuse  Maternal Grandfather     Social History   Socioeconomic History  . Marital status: Widowed    Spouse name: Not on file  . Number of children: 2  . Years of education: Not on file  . Highest education level: Not on file  Occupational History  . 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 Smoking status: Current Every Day Smoker    Packs/day: 1.00    Years: 40.00    Pack years: 40.00    Types: Cigarettes  . Smokeless tobacco: Never Used  Vaping Use  . Vaping Use: Never used  Substance and Sexual Activity  . Alcohol use: Yes    Comment: rare  . Drug use: No  . Sexual activity: Not on file  Other Topics Concern  . Not on file  Social History Narrative   Widowed, 2 daughters.   Works with entertainment companies to set up venues    Orig from [place], has lived in [place] since 1970.   Tobacco 80 pack-yr hx, ongoing as of 03/2014.   Rare alcohol.  No drug use except DISTANT use of marijuana.   Social Determinants of Health   Financial Resource Strain:   . Difficulty of Paying Living Expenses:   Food Insecurity:   . Worried About Charity fundraiser in the Last Year:   . Ran  Out of Food in the Last Year:   Transportation Needs:   . Lack of Transportation (Medical):   Marland Kitchen Lack of Transportation (Non-Medical):   Physical Activity:   . Days of Exercise per Week:   . Minutes of Exercise per Session:   Stress:   . Feeling of Stress :   Social Connections:   . Frequency of Communication with Friends and Family:   . Frequency of Social Gatherings with Friends and Family:   . Attends Religious Services:   . Active Member of Clubs or Organizations:   . Attends Archivist Meetings:   Marland Kitchen Marital Status:   Intimate Partner Violence:   . Fear of Current or Ex-Partner:   . Emotionally Abused:   Marland Kitchen Physically Abused:   . Sexually Abused:    Review of Systems  Constitutional: Negative for fever and weight loss.    HENT: Negative for ear discharge, ear pain, hearing loss and tinnitus.   Eyes: Negative for blurred vision, double vision, photophobia and pain.  Respiratory: Negative for cough and shortness of breath.   Cardiovascular: Negative for chest pain and palpitations.  Gastrointestinal: Negative for abdominal pain, blood in stool, constipation, diarrhea, heartburn, melena, nausea and vomiting.  Genitourinary: Negative for dysuria, flank pain, frequency, hematuria and urgency.  Musculoskeletal: Negative for falls.  Neurological: Negative for dizziness, loss of consciousness and headaches.  Endo/Heme/Allergies: Negative for environmental allergies.  Psychiatric/Behavioral: Negative for depression, hallucinations, substance abuse and suicidal ideas. The patient is not nervous/anxious and does not have insomnia.     BP 126/78   Pulse 73   Temp 98.7 F (37.1 C) (Temporal)   Resp 16   Ht 5\' 10"  (1.778 m)   Wt 207 lb (93.9 kg)   SpO2 98%   BMI 29.70 kg/m   Physical Exam Vitals reviewed.  Constitutional:      General: He is not in acute distress.    Appearance: He is well-developed. He is not diaphoretic.  HENT:     Head: Normocephalic and atraumatic.     Right Ear: Tympanic membrane, ear canal and external ear normal.     Left Ear: Tympanic membrane, ear canal and external ear normal.     Nose: Nose normal.     Mouth/Throat:     Pharynx: No posterior oropharyngeal erythema.  Eyes:     Conjunctiva/sclera: Conjunctivae normal.     Pupils: Pupils are equal, round, and reactive to light.  Neck:     Thyroid: No thyromegaly.  Cardiovascular:     Rate and Rhythm: Normal rate and regular rhythm.     Heart sounds: Normal heart sounds.  Pulmonary:     Effort: Pulmonary effort is normal. No respiratory distress.     Breath sounds: Normal breath sounds. No wheezing or rales.  Chest:     Chest wall: No tenderness.  Abdominal:     General: Bowel sounds are normal. There is no distension.      Palpations: Abdomen is soft. There is no mass.     Tenderness: There is no abdominal tenderness. There is no guarding or rebound.  Musculoskeletal:     Cervical back: Neck supple.  Lymphadenopathy:     Cervical: No cervical adenopathy.  Skin:    General: Skin is warm and dry.     Findings: No rash.  Neurological:     Mental Status: He is alert and oriented to person, place, and time.     Cranial Nerves: No cranial nerve deficit.  Assessment/Plan: 1. Visit for preventive health examination Depression screen negative. Health Maintenance reviewed -- Colonoscopy scheduled for 04/30/20. Preventive schedule discussed and handout given in AVS. Will obtain fasting labs today.  2. Prostate cancer screening The natural history of prostate cancer and ongoing controversy regarding screening and potential treatment outcomes of prostate cancer has been discussed with the patient. The meaning of a false positive PSA and a false negative PSA has been discussed. He indicates understanding of the limitations of this screening test and wishes to proceed with screening PSA testing.  - PSA, Medicare  3. HTN (hypertension), benign BP normotensive.  Asymptomatic.  Repeat fasting labs today.  Continue current regimen. - Comprehensive metabolic panel - Lipid panel  4. Tobacco dependence Recent increase in tobacco use secondary to stressors.  Discussed supportive measures to help cut back on this.  Recommended further intervention but patient declines at present.  Will monitor closely.  5. Centrilobular emphysema ([place]) Patient to continue current regimen.  Follow-up with pulmonology as scheduled.  Is due in the fall for repeat lung cancer screen.  Reminder placed in system.  6. Overweight (BMI 25.0-29.9) Has lost about 50 pounds since last visit 919.  Patient congratulated on his hard work.  Dietary and exercise recommendations again reviewed with patient.  We will continue to monitor.  Repeat labs  today.  7. Polycythemia Prior history.  Asymptomatic.  Blood pressure stable today.  We will repeat CBC. - CBC with Differential/Platelet   This visit occurred during the SARS-CoV-2 public health emergency.  Safety protocols were in place, including screening questions prior to the visit, additional usage of staff PPE, and extensive cleaning of exam room while observing appropriate contact time as indicated for disinfecting solutions.     Leeanne Rio, PA-C

## 2020-04-08 NOTE — Patient Instructions (Signed)
Please go to the lab for blood work.   Our office will call you with your results unless you have chosen to receive results via MyChart.  If your blood work is normal we will follow-up each year for physicals and as scheduled for chronic medical problems.  If anything is abnormal we will treat accordingly and get you in for a follow-up.  Please continue to work on diet and exercise. You should be proud of the great progress you have made!  Also try to work on cutting back on the cigarettes further using the tools we discussed. Please reconsider letting us start a medication to help with things if you feel you are not making further progress.      Preventive Care 23 Years and Older, Male Preventive care refers to lifestyle choices and visits with your health care provider that can promote health and wellness. This includes:  A yearly physical exam. This is also called an annual well check.  Regular dental and eye exams.  Immunizations.  Screening for certain conditions.  Healthy lifestyle choices, such as diet and exercise. What can I expect for my preventive care visit? Physical exam Your health care provider will check:  Height and weight. These may be used to calculate body mass index (BMI), which is a measurement that tells if you are at a healthy weight.  Heart rate and blood pressure.  Your skin for abnormal spots. Counseling Your health care provider may ask you questions about:  Alcohol, tobacco, and drug use.  Emotional well-being.  Home and relationship well-being.  Sexual activity.  Eating habits.  History of falls.  Memory and ability to understand (cognition).  Work and work Statistician. What immunizations do I need?  Influenza (flu) vaccine  This is recommended every year. Tetanus, diphtheria, and pertussis (Tdap) vaccine  You may need a Td booster every 10 years. Varicella (chickenpox) vaccine  You may need this vaccine if you have not  already been vaccinated. Zoster (shingles) vaccine  You may need this after age 34. Pneumococcal conjugate (PCV13) vaccine  One dose is recommended after age 73. Pneumococcal polysaccharide (PPSV23) vaccine  One dose is recommended after age 15. Measles, mumps, and rubella (MMR) vaccine  You may need at least one dose of MMR if you were born in 1957 or later. You may also need a second dose. Meningococcal conjugate (MenACWY) vaccine  You may need this if you have certain conditions. Hepatitis A vaccine  You may need this if you have certain conditions or if you travel or work in places where you may be exposed to hepatitis A. Hepatitis B vaccine  You may need this if you have certain conditions or if you travel or work in places where you may be exposed to hepatitis B. Haemophilus influenzae type b (Hib) vaccine  You may need this if you have certain conditions. You may receive vaccines as individual doses or as more than one vaccine together in one shot (combination vaccines). Talk with your health care provider about the risks and benefits of combination vaccines. What tests do I need? Blood tests  Lipid and cholesterol levels. These may be checked every 5 years, or more frequently depending on your overall health.  Hepatitis C test.  Hepatitis B test. Screening  Lung cancer screening. You may have this screening every year starting at age 43 if you have a 30-pack-year history of smoking and currently smoke or have quit within the past 15 years.  Colorectal cancer screening.  All adults should have this screening starting at age 61 and continuing until age 56. Your health care provider may recommend screening at age 45 if you are at increased risk. You will have tests every 1-10 years, depending on your results and the type of screening test.  Prostate cancer screening. Recommendations will vary depending on your family history and other risks.  Diabetes screening. This is  done by checking your blood sugar (glucose) after you have not eaten for a while (fasting). You may have this done every 1-3 years.  Abdominal aortic aneurysm (AAA) screening. You may need this if you are a current or former smoker.  Sexually transmitted disease (STD) testing. Follow these instructions at home: Eating and drinking  Eat a diet that includes fresh fruits and vegetables, whole grains, lean protein, and low-fat dairy products. Limit your intake of foods with high amounts of sugar, saturated fats, and salt.  Take vitamin and mineral supplements as recommended by your health care provider.  Do not drink alcohol if your health care provider tells you not to drink.  If you drink alcohol: ? Limit how much you have to 0-2 drinks a day. ? Be aware of how much alcohol is in your drink. In the [place] one drink equals one 12 oz bottle of beer (355 mL), one 5 oz glass of wine (148 mL), or one 1 oz glass of hard liquor (44 mL). Lifestyle  Take daily care of your teeth and gums.  Stay active. Exercise for at least 30 minutes on 5 or more days each week.  Do not use any products that contain nicotine or tobacco, such as cigarettes, e-cigarettes, and chewing tobacco. If you need help quitting, ask your health care provider.  If you are sexually active, practice safe sex. Use a condom or other form of protection to prevent STIs (sexually transmitted infections).  Talk with your health care provider about taking a low-dose aspirin or statin. What's next?  Visit your health care provider once a year for a well check visit.  Ask your health care provider how often you should have your eyes and teeth checked.  Stay up to date on all vaccines. This information is not intended to replace advice given to you by your health care provider. Make sure you discuss any questions you have with your health care provider. Document Revised: 09/07/2018 Document Reviewed: 09/07/2018 Elsevier Patient  Education  2020 [employer].

## 2020-04-09 ENCOUNTER — Other Ambulatory Visit (INDEPENDENT_AMBULATORY_CARE_PROVIDER_SITE_OTHER): Payer: Medicare HMO

## 2020-04-09 DIAGNOSIS — R7309 Other abnormal glucose: Secondary | ICD-10-CM | POA: Diagnosis not present

## 2020-04-09 LAB — HEMOGLOBIN A1C: Hgb A1c MFr Bld: 6.3 % (ref 4.6–6.5)

## 2020-04-30 DIAGNOSIS — Z86010 Personal history of colonic polyps: Secondary | ICD-10-CM | POA: Diagnosis not present

## 2020-04-30 DIAGNOSIS — D122 Benign neoplasm of ascending colon: Secondary | ICD-10-CM | POA: Diagnosis not present

## 2020-04-30 DIAGNOSIS — D123 Benign neoplasm of transverse colon: Secondary | ICD-10-CM | POA: Diagnosis not present

## 2020-05-02 DIAGNOSIS — D122 Benign neoplasm of ascending colon: Secondary | ICD-10-CM | POA: Diagnosis not present

## 2020-05-02 DIAGNOSIS — D123 Benign neoplasm of transverse colon: Secondary | ICD-10-CM | POA: Diagnosis not present

## 2020-05-05 ENCOUNTER — Other Ambulatory Visit: Payer: Self-pay

## 2020-05-05 ENCOUNTER — Ambulatory Visit (HOSPITAL_COMMUNITY)
Admission: EM | Admit: 2020-05-05 | Discharge: 2020-05-05 | Disposition: A | Discharge status: Home / Self Care | Payer: Medicare HMO | Attending: Family Medicine | Admitting: Family Medicine

## 2020-05-05 ENCOUNTER — Encounter (HOSPITAL_COMMUNITY): Payer: Self-pay

## 2020-05-05 DIAGNOSIS — S41112A Laceration without foreign body of left upper arm, initial encounter: Secondary | ICD-10-CM | POA: Diagnosis not present

## 2020-05-05 DIAGNOSIS — Z23 Encounter for immunization: Secondary | ICD-10-CM | POA: Diagnosis not present

## 2020-05-05 MED ORDER — TETANUS-DIPHTH-ACELL PERTUSSIS 5-2.5-18.5 LF-MCG/0.5 IM SUSP
0.5000 mL | Freq: Once | INTRAMUSCULAR | Status: AC
  Administered 2020-05-05: 0.5 mL via INTRAMUSCULAR

## 2020-05-05 MED ORDER — TETANUS-DIPHTH-ACELL PERTUSSIS 5-2.5-18.5 LF-MCG/0.5 IM SUSP
INTRAMUSCULAR | Status: AC
  Filled 2020-05-05: qty 0.5

## 2020-05-05 NOTE — ED Notes (Signed)
Applied dry dressing to pt's lac

## 2020-05-05 NOTE — ED Triage Notes (Signed)
Pt states cut left forearm with knife while cutting tie wraps at work today. Pt has bandage on lac. Bleeding is controlled.

## 2020-05-05 NOTE — Discharge Instructions (Signed)
Keep clean and reasonably dry Watch for infection.  Return if you see any pus redness or drainage Sutures out in 7 days

## 2020-05-05 NOTE — ED Provider Notes (Signed)
[place]    CSN: [id number] Arrival date & time: 05/05/20  1016      History   Chief Complaint Chief Complaint  Patient presents with  . Extremity Laceration    HPI Cameron [name] is a 69 y.o. male.   HPI   Patient was at work today.  He was cutting tire wraps at work, and the knife slipped and cut him in the forearm.  Laceration left forearm.  Bleeding controlled with pressure  Past Medical History:  Diagnosis Date  . Adenomatous colon polyp 05/2009; 11/2014   2010:Tubular adenoma, no high grade dysplasia.Marland Kitchen  + Hyperplastic polyps. 2016: Tubular adenoma-rpt 5 yrs.  . Coronary artery disease   . H/O chronic gastritis 05/2009   EGD: no h.pylori,dysplasia,or evidence of malignancy  . Hyperlipidemia    lovaza from prior PMD-stopped due to cost  . Hypertension   . Obesity, Class I, BMI 30.0-34.9 (see actual BMI) 04/23/2014  . Tobacco dependence     Patient Active Problem List   Diagnosis Date Noted  . Abnormal hemoglobin (Hgb) ([place]) 04/08/2020  . Need for hepatitis C screening test 04/26/2018  . Centrilobular emphysema ([place]) 04/20/2018  . Polycythemia 04/20/2018  . CAD (coronary artery disease) 03/31/2018  . Visit for preventive health examination 02/27/2018  . Overweight (BMI 25.0-29.9) 04/23/2014  . Tobacco dependence 12/29/2012  . Health maintenance examination 03/13/2012  . Hyperlipemia, mixed 03/13/2012  . HTN (hypertension), benign 02/11/2012    Past Surgical History:  Procedure Laterality Date  . APPENDECTOMY  1973  . CHOLECYSTECTOMY N/A 07/06/2018   Procedure: LAPAROSCOPIC CHOLECYSTECTOMY WITH INTRAOPERATIVE CHOLANGIOGRAM;  Surgeon: Alphonsa Overall, MD;  Location: [place];  Service: General;  Laterality: N/A;  . COLONOSCOPY  2010   [facility]  . EYE SURGERY    . STRABISMUS SURGERY  age 35 or 9       Home Medications    Prior to Admission medications   Medication Sig Start Date End Date Taking? Authorizing Provider  aspirin 81 MG tablet Take 81  mg by mouth daily.    [provider]  atorvastatin (LIPITOR) 20 MG tablet Take 1 tablet (20 mg total) by mouth daily. 02/06/20   Brunetta Jeans, PA-C  benazepril (LOTENSIN) 40 MG tablet Take 1 tablet (40 mg total) by mouth daily. 02/06/20   Brunetta Jeans, PA-C  hydrochlorothiazide (MICROZIDE) 12.5 MG capsule Take 1 capsule (12.5 mg total) by mouth daily. 02/06/20   Brunetta Jeans, PA-C    Family History Family History  Problem Relation Age of Onset  . Heart disease Mother   . COPD Father   . Hypertension Father   . Hyperlipidemia Father   . Diabetes Father   . Heart attack Father   . Dementia Father   . Alcohol abuse Maternal Grandfather     Social History Social History   Tobacco Use  . Smoking status: Current Every Day Smoker    Packs/day: 1.00    Years: 40.00    Pack years: 40.00    Types: Cigarettes  . Smokeless tobacco: Never Used  Vaping Use  . Vaping Use: Never used  Substance Use Topics  . Alcohol use: Not Currently    Comment: rare  . Drug use: No     Allergies   Patient has no known allergies.   Review of Systems Review of Systems See HPI Physical Exam Triage Vital Signs ED Triage Vitals [05/05/20 1229]  Enc Vitals Group     BP Marland Kitchen)  160/90     Pulse Rate 70     Resp 16     Temp 97.9 F (36.6 C)     Temp Source Oral     SpO2 94 %     Weight 207 lb (93.9 kg)     Height 5\' 10"  (1.778 m)     Head Circumference      Peak Flow      Pain Score 4     Pain Loc      Pain Edu?      Excl. in [place]?    No data found.  Updated Vital Signs BP (!) 160/90   Pulse 70   Temp 97.9 F (36.6 C) (Oral)   Resp 16   Ht 5\' 10"  (1.778 m)   Wt 93.9 kg   SpO2 94%   BMI 29.70 kg/m     Physical Exam Constitutional:      General: He is not in acute distress.    Appearance: He is well-developed.  HENT:     Head: Normocephalic and atraumatic.  Eyes:     Conjunctiva/sclera: Conjunctivae normal.     Pupils: Pupils are equal, round, and  reactive to light.  Cardiovascular:     Rate and Rhythm: Normal rate.  Pulmonary:     Effort: Pulmonary effort is normal. No respiratory distress.  Musculoskeletal:        General: Normal range of motion.     Cervical back: Normal range of motion.  Skin:    General: Skin is warm and dry.     Comments: 3 cm laceration is present in the central surface, volar forearm, left  Neurological:     Mental Status: He is alert.      UC Treatments / Results  Labs (all labs ordered are listed, but only abnormal results are displayed) Labs Reviewed - No data to display  EKG   Radiology No results found.  Procedures Laceration Repair  Date/Time: 05/05/2020 3:27 PM Performed by: Raylene Everts, MD Authorized by: Raylene Everts, MD   Consent:    Consent obtained:  Verbal   Risks discussed:  Infection Anesthesia (see MAR for exact dosages):    Anesthesia method:  Local infiltration   Local anesthetic:  Lidocaine 2% w/o epi Laceration details:    Location:  Shoulder/arm   Shoulder/arm location:  L lower arm   Length (cm):  3   Depth (mm):  7 Repair type:    Repair type:  Simple Pre-procedure details:    Preparation:  Patient was prepped and draped in usual sterile fashion Exploration:    Hemostasis achieved with:  Direct pressure   Wound exploration: wound explored through full range of motion and entire depth of wound probed and visualized     Wound extent: muscle damage     Contaminated: no   Treatment:    Area cleansed with:  Shur-Clens   Amount of cleaning:  Standard Skin repair:    Repair method:  Sutures   Suture size:  4-0   Suture material:  Nylon Approximation:    Approximation:  Close Post-procedure details:    Dressing:  Antibiotic ointment and bulky dressing   Patient tolerance of procedure:  Tolerated well, no immediate complications     Medications Ordered in UC Medications  Tdap (BOOSTRIX) injection 0.5 mL (0.5 mLs Intramuscular Given 05/05/20  1332)    Initial Impression / Assessment and Plan / UC Course  I have reviewed the triage vital signs and  the nursing notes.  Pertinent labs & imaging results that were available during my care of the patient were reviewed by me and considered in my medical decision making (see chart for details).     Discussed wound care.  Laceration removal.  Complications. Final Clinical Impressions(s) / UC Diagnoses   Final diagnoses:  Laceration of left upper extremity, initial encounter     Discharge Instructions     Keep clean and reasonably dry Watch for infection.  Return if you see any pus redness or drainage Sutures out in 7 days   ED Prescriptions    None     PDMP not reviewed this encounter.   Raylene Everts, MD 05/05/20 [phone]

## 2020-05-09 ENCOUNTER — Telehealth: Payer: Self-pay | Admitting: Physician Assistant

## 2020-05-09 NOTE — Telephone Encounter (Signed)
Pt called in asking if Cameron [name] can remove sutures that he had placed in on Monday 05/05/20 at [facility] urgent care. He states that he needs these removed either Monday or Tuesday. Please advise

## 2020-05-09 NOTE — Telephone Encounter (Signed)
Pt scheduled  

## 2020-05-09 NOTE — Telephone Encounter (Signed)
Ok to schedule for Monday for suture removal.

## 2020-05-12 ENCOUNTER — Encounter: Payer: Self-pay | Admitting: Physician Assistant

## 2020-05-12 ENCOUNTER — Ambulatory Visit (INDEPENDENT_AMBULATORY_CARE_PROVIDER_SITE_OTHER): Payer: Medicare HMO | Admitting: Physician Assistant

## 2020-05-12 ENCOUNTER — Other Ambulatory Visit: Payer: Self-pay

## 2020-05-12 VITALS — BP 124/80 | HR 72 | Temp 98.3°F | Resp 16 | Ht 70.0 in | Wt 206.0 lb

## 2020-05-12 DIAGNOSIS — Z4802 Encounter for removal of sutures: Secondary | ICD-10-CM | POA: Diagnosis not present

## 2020-05-12 NOTE — Patient Instructions (Signed)
Keep skin clean and dry Bandage if you are going to be working and might get the area dirty.  Wash as soon as you get home.  Area has healed well and I would not expect any infection but if you note new onset redness, tenderness, warmth or drainage, give [place] a call ASAP.  Hang in there!

## 2020-05-12 NOTE — Progress Notes (Signed)
Patient presents to clinic today for suture removal. Patient sustained 3 cm laceration to L anterior forearm 1 week ago when he accidentally cut himself with a steak knife at his home. Immediately cleaned and dressed the area before heading to the ER. Area was further cleaned and explored. Found to be negative for foreign object. Laceration was repaired with 4 simple, interrupted sutures. Patients TD was updated. Patient notes doing well. Denies any redness, pain, warmth or drainage from the site. Has been keeping clean and dry as directed. Thinks the area has healed well.   Past Medical History:  Diagnosis Date  . Adenomatous colon polyp 05/2009; 11/2014   2010:Tubular adenoma, no high grade dysplasia.Marland Kitchen  + Hyperplastic polyps. 2016: Tubular adenoma-rpt 5 yrs.  . Coronary artery disease   . H/O chronic gastritis 05/2009   EGD: no h.pylori,dysplasia,or evidence of malignancy  . Hyperlipidemia    lovaza from prior PMD-stopped due to cost  . Hypertension   . Obesity, Class I, BMI 30.0-34.9 (see actual BMI) 04/23/2014  . Tobacco dependence     Current Outpatient Medications on File Prior to Visit  Medication Sig Dispense Refill  . aspirin 81 MG tablet Take 81 mg by mouth daily.    Marland Kitchen atorvastatin (LIPITOR) 20 MG tablet Take 1 tablet (20 mg total) by mouth daily. 90 tablet 0  . benazepril (LOTENSIN) 40 MG tablet Take 1 tablet (40 mg total) by mouth daily. 90 tablet 0  . hydrochlorothiazide (MICROZIDE) 12.5 MG capsule Take 1 capsule (12.5 mg total) by mouth daily. 90 capsule 0   No current facility-administered medications on file prior to visit.    No Known Allergies  Family History  Problem Relation Age of Onset  . Heart disease Mother   . COPD Father   . Hypertension Father   . Hyperlipidemia Father   . Diabetes Father   . Heart attack Father   . Dementia Father   . Alcohol abuse Maternal Grandfather     Social History   Socioeconomic History  . Marital status: Widowed     Spouse name: Not on file  . Number of children: 2  . Years of education: Not on file  . Highest education level: Not on file  Occupational History  . 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 Smoking status: Current Every Day Smoker    Packs/day: 1.00    Years: 40.00    Pack years: 40.00    Types: Cigarettes  . Smokeless tobacco: Never Used  Vaping Use  . Vaping Use: Never used  Substance and Sexual Activity  . Alcohol use: Not Currently    Comment: rare  . Drug use: No  . Sexual activity: Not on file  Other Topics Concern  . Not on file  Social History Narrative   Widowed, 2 daughters.   Works with entertainment companies to set up venues    Orig from [place], has lived in [place] since 1970.   Tobacco 80 pack-yr hx, ongoing as of 03/2014.   Rare alcohol.  No drug use except DISTANT use of marijuana.   Social Determinants of Health   Financial Resource Strain:   . Difficulty of Paying Living Expenses:   Food Insecurity:   . Worried About Charity fundraiser in the Last Year:   . Arboriculturist in the Last Year:   Transportation Needs:   . Film/video editor (Medical):   Marland Kitchen  Lack of Transportation (Non-Medical):   Physical Activity:   . Days of Exercise per Week:   . Minutes of Exercise per Session:   Stress:   . Feeling of Stress :   Social Connections:   . Frequency of Communication with Friends and Family:   . Frequency of Social Gatherings with Friends and Family:   . Attends Religious Services:   . Active Member of Clubs or Organizations:   . Attends Archivist Meetings:   Marland Kitchen Marital Status:     Review of Systems - See HPI.  All other ROS are negative.  BP 124/80   Pulse 72   Temp 98.3 F (36.8 C) (Temporal)   Resp 16   Ht 5\' 10"  (1.778 m)   Wt 206 lb (93.4 kg)   SpO2 96%   BMI 29.56 kg/m   Physical Exam  Recent Results (from the past 2160 hour(s))  CBC with  Differential/Platelet     Status: Abnormal   Collection Time: 04/08/20 10:20 AM  Result Value Ref Range   WBC 8.4 4.0 - 10.5 K/uL   RBC 5.39 4.22 - 5.81 Mil/uL   Hemoglobin 17.6 (H) 13.0 - 17.0 g/dL   HCT 50.6 39 - 52 %   MCV 93.8 78.0 - 100.0 fl   MCHC 34.7 30.0 - 36.0 g/dL   RDW 14.3 11.5 - 15.5 %   Platelets 274.0 150 - 400 K/uL   Neutrophils Relative % 62.1 43 - 77 %   Lymphocytes Relative 25.1 12 - 46 %   Monocytes Relative 8.4 3 - 12 %   Eosinophils Relative 3.5 0 - 5 %   Basophils Relative 0.9 0 - 3 %   Neutro Abs 5.2 1.4 - 7.7 K/uL   Lymphs Abs 2.1 0.7 - 4.0 K/uL   Monocytes Absolute 0.7 0 - 1 K/uL   Eosinophils Absolute 0.3 0 - 0 K/uL   Basophils Absolute 0.1 0 - 0 K/uL  Comprehensive metabolic panel     Status: Abnormal   Collection Time: 04/08/20 10:20 AM  Result Value Ref Range   Sodium 134 (L) 135 - 145 mEq/L   Potassium 4.1 3.5 - 5.1 mEq/L   Chloride 96 96 - 112 mEq/L   CO2 30 19 - 32 mEq/L   Glucose, Bld 124 (H) 70 - 99 mg/dL   BUN 11 6 - 23 mg/dL   Creatinine, Ser 0.90 0.40 - 1.50 mg/dL   Total Bilirubin 0.8 0.2 - 1.2 mg/dL   Alkaline Phosphatase 90 39 - 117 U/L   AST 15 0 - 37 U/L   ALT 14 0 - 53 U/L   Total Protein 6.5 6.0 - 8.3 g/dL   Albumin 4.3 3.5 - 5.2 g/dL   GFR 84.15 >60.00 mL/min   Calcium 9.3 8.4 - 10.5 mg/dL  Lipid panel     Status: None   Collection Time: 04/08/20 10:20 AM  Result Value Ref Range   Cholesterol 162 0 - 200 mg/dL    Comment: ATP III Classification       Desirable:  < 200 mg/dL               Borderline High:  200 - 239 mg/dL          High:  > = 240 mg/dL   Triglycerides 138.0 0 - 149 mg/dL    Comment: Normal:  <150 mg/dLBorderline High:  150 - 199 mg/dL   HDL 45.10 >39.00 mg/dL   VLDL 27.6 0.0 -  40.0 mg/dL   LDL Cholesterol 89 0 - 99 mg/dL   Total CHOL/HDL Ratio 4     Comment:                Men          Women1/2 Average Risk     3.4          3.3Average Risk          5.0          4.42X Average Risk          9.6           7.13X Average Risk          15.0          11.0                       NonHDL 117.03     Comment: NOTE:  Non-HDL goal should be 30 mg/dL higher than patient's LDL goal (i.e. LDL goal of < 70 mg/dL, would have non-HDL goal of < 100 mg/dL)  PSA, Medicare     Status: None   Collection Time: 04/08/20 10:20 AM  Result Value Ref Range   PSA 0.86 0.10 - 4.00 ng/ml    Comment: Test performed using Access Hybritech PSA Assay, a parmagnetic partical, chemiluminecent immunoassay.  Hemoglobin A1c     Status: None   Collection Time: 04/09/20 11:15 AM  Result Value Ref Range   Hgb A1c MFr Bld 6.3 4.6 - 6.5 %    Comment: Glycemic Control Guidelines for People with Diabetes:Non Diabetic:  <6%Goal of Therapy: <7%Additional Action Suggested:  >8%     Assessment/Plan: 1. Visit for suture removal Wound healed well. Great approximation of wound edges. No evidence of infection. Wound ready for suture removal. Sutures removed successfully x 4 today with use of forceps and sterile scissors. Home care discussed with patient.   This visit occurred during the SARS-CoV-2 public health emergency.  Safety protocols were in place, including screening questions prior to the visit, additional usage of staff PPE, and extensive cleaning of exam room while observing appropriate contact time as indicated for disinfecting solutions.     Leeanne Rio, PA-C

## 2020-05-17 ENCOUNTER — Other Ambulatory Visit: Payer: Self-pay | Admitting: Physician Assistant

## 2020-05-17 DIAGNOSIS — I10 Essential (primary) hypertension: Secondary | ICD-10-CM

## 2020-05-17 DIAGNOSIS — E782 Mixed hyperlipidemia: Secondary | ICD-10-CM

## 2020-07-14 ENCOUNTER — Telehealth: Payer: Self-pay | Admitting: Physician Assistant

## 2020-07-14 NOTE — Telephone Encounter (Signed)
Patient is coming in Thursday at 9:00 am for his flu shot - he would like to know if he also needs a pneumonia shot and a shingles shot.  Please advise

## 2020-07-14 NOTE — Telephone Encounter (Signed)
Pt informed and stated an understanding. Appt note edited to reflect. Pt will contact the office 1 month after vaccinations to have shingrix sent to the pharmacy.

## 2020-07-14 NOTE — Telephone Encounter (Signed)
Is due for Prevnar and can get at same time as flu shot.  Can get Shingrix but due to Medicare should get at the pharmacy as is not covered in office.

## 2020-07-17 ENCOUNTER — Other Ambulatory Visit: Payer: Self-pay

## 2020-07-17 ENCOUNTER — Ambulatory Visit (INDEPENDENT_AMBULATORY_CARE_PROVIDER_SITE_OTHER): Payer: Medicare HMO

## 2020-07-17 DIAGNOSIS — Z23 Encounter for immunization: Secondary | ICD-10-CM

## 2020-07-17 NOTE — Progress Notes (Signed)
Cameron [name], 69 y.o. male presents to the office today for his Pneumococcal 13-valent Conjugate Vaccine. Vaccine information sheet given and the vaccine administered IM in the left deltoid. Patient tolerated well and left the office in good condition. Fritz Pickerel, LPN

## 2020-07-20 IMAGING — RF DG CHOLANGIOGRAM OPERATIVE
1 series · 5 of 5 positions shown · non-contrast
Comparison: None.

CLINICAL DATA: Gallstones

EXAM:
INTRAOPERATIVE CHOLANGIOGRAM
TECHNIQUE: Cholangiographic images from the C-arm fluoroscopic device were
submitted for interpretation post-operatively. Please see the
procedural report for the amount of contrast and the fluoroscopy
time utilized.

[Series 1: run · 2 acquisitions, 5 frames shown]
[im 1/2]
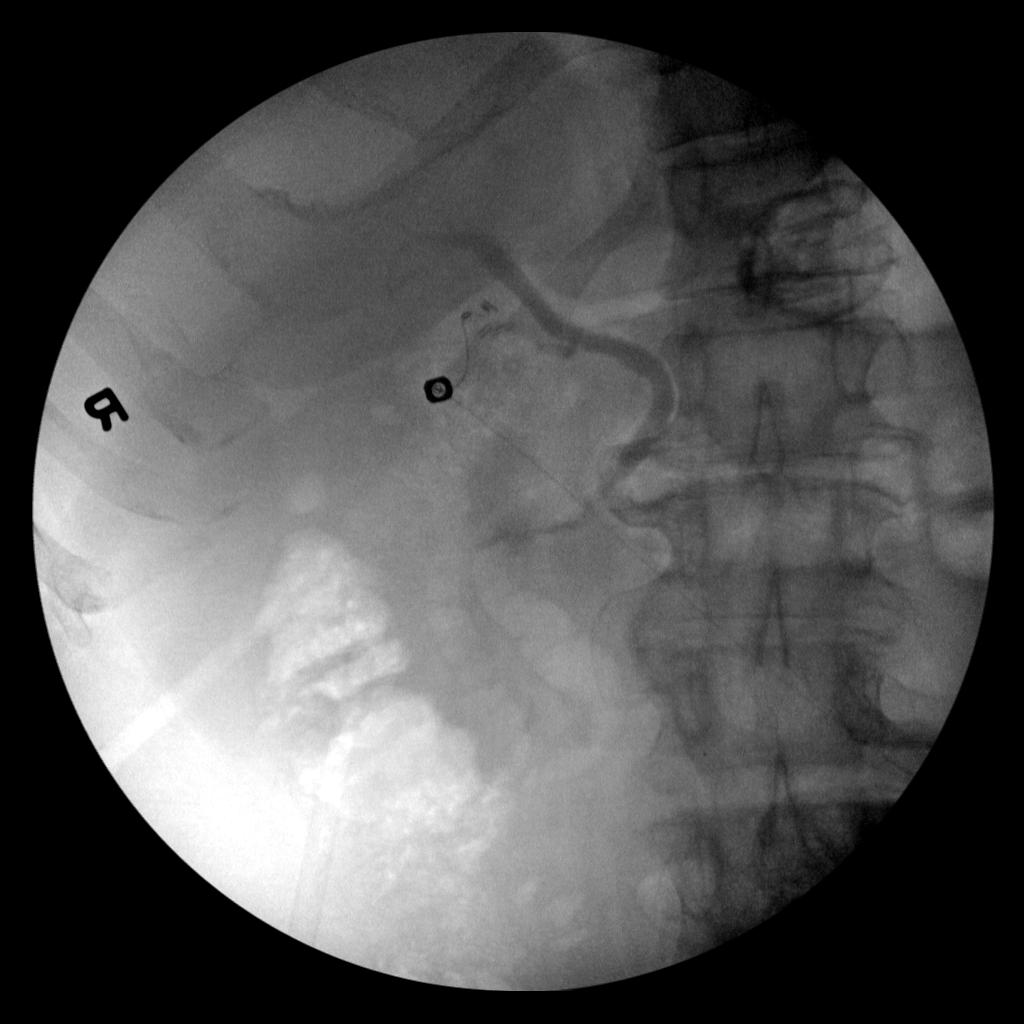
[im 1/2]
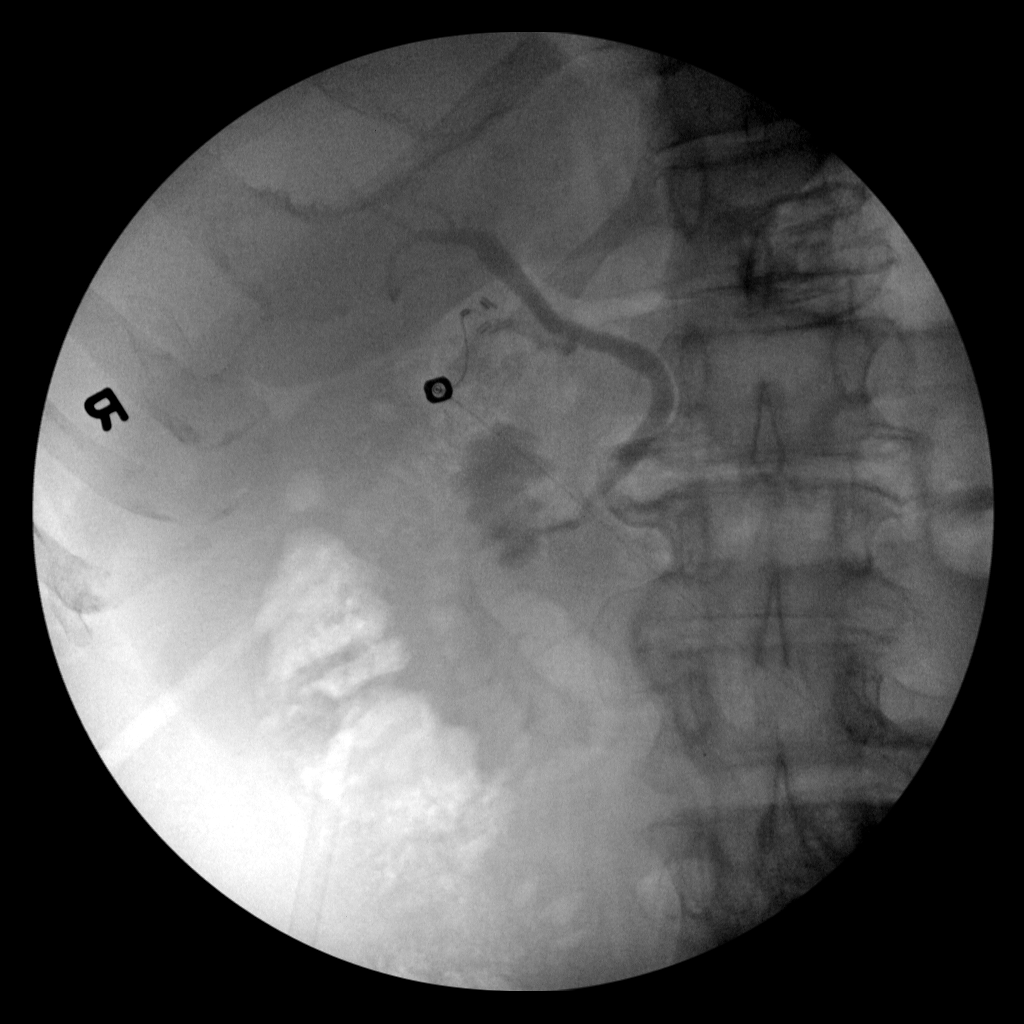
[im 1/2]
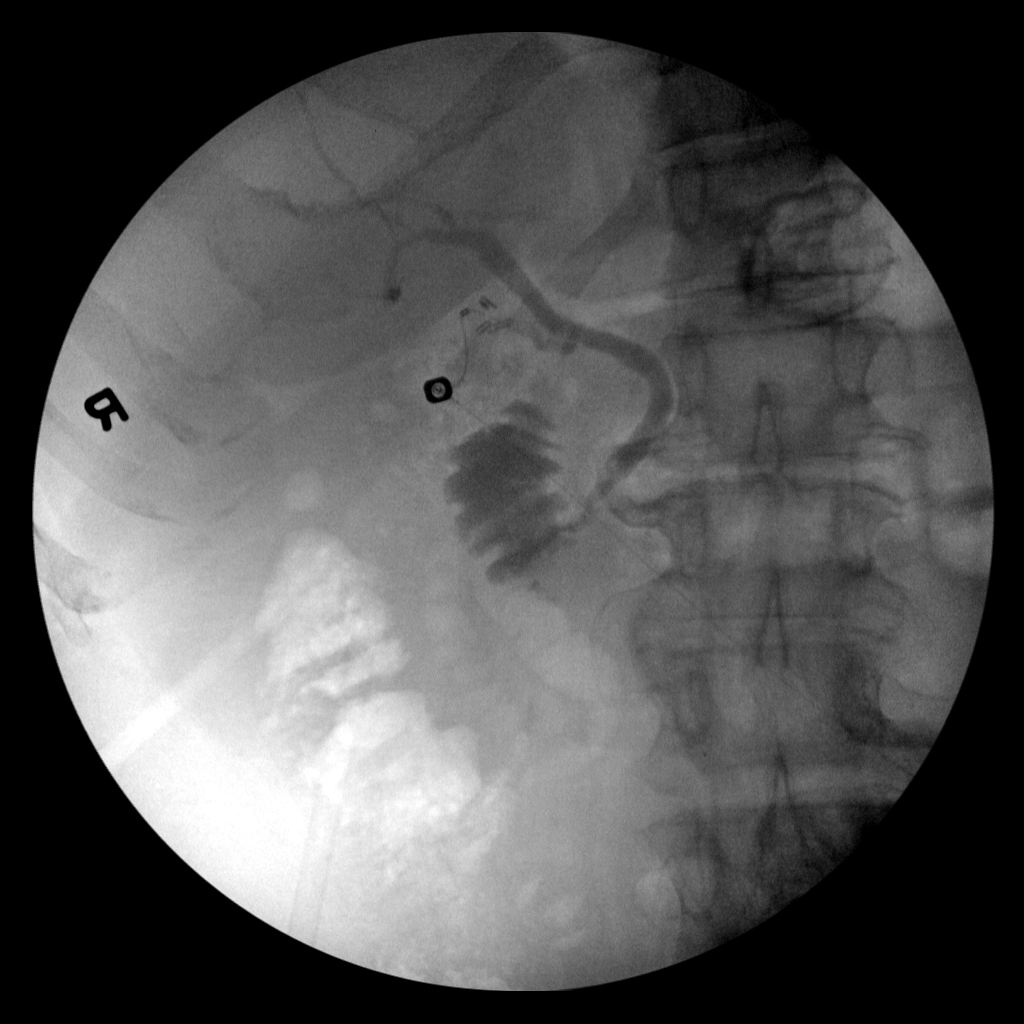
[im 1/2]
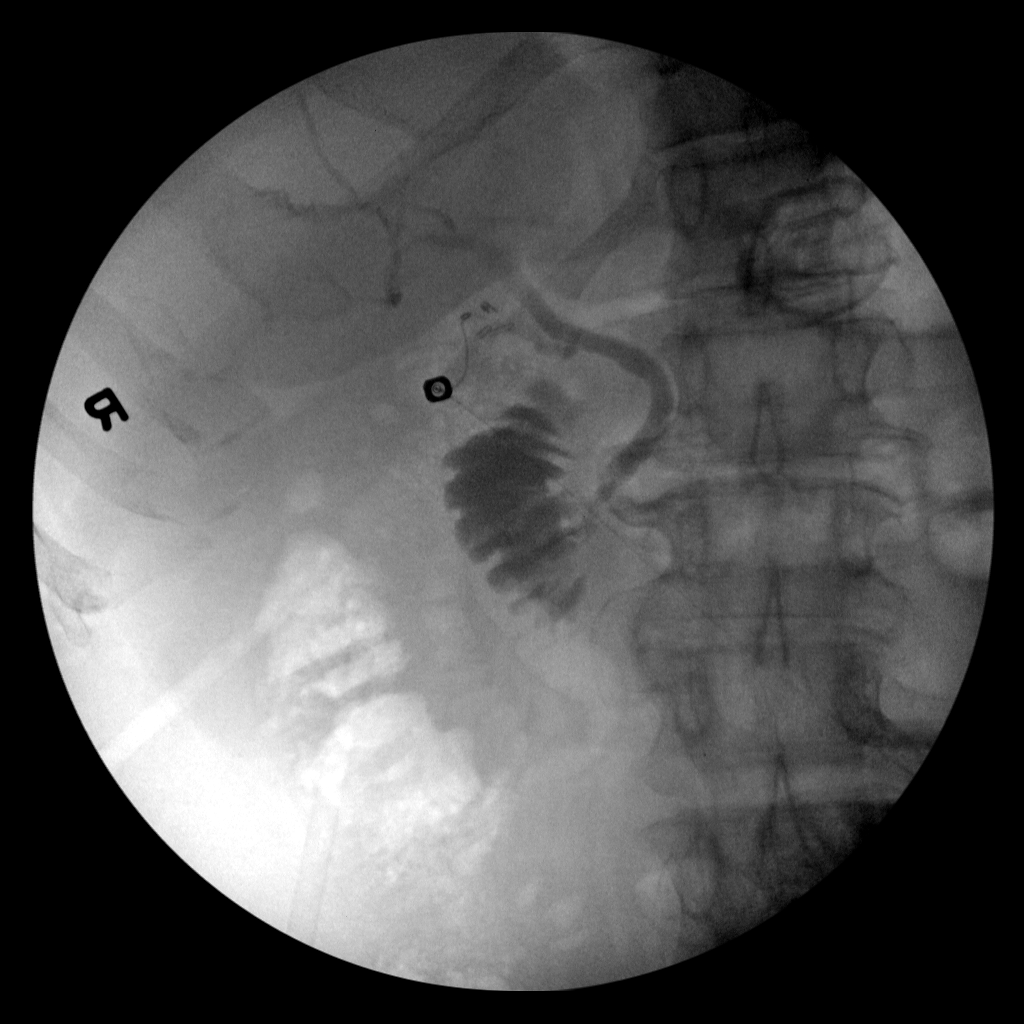
[im 2/2]
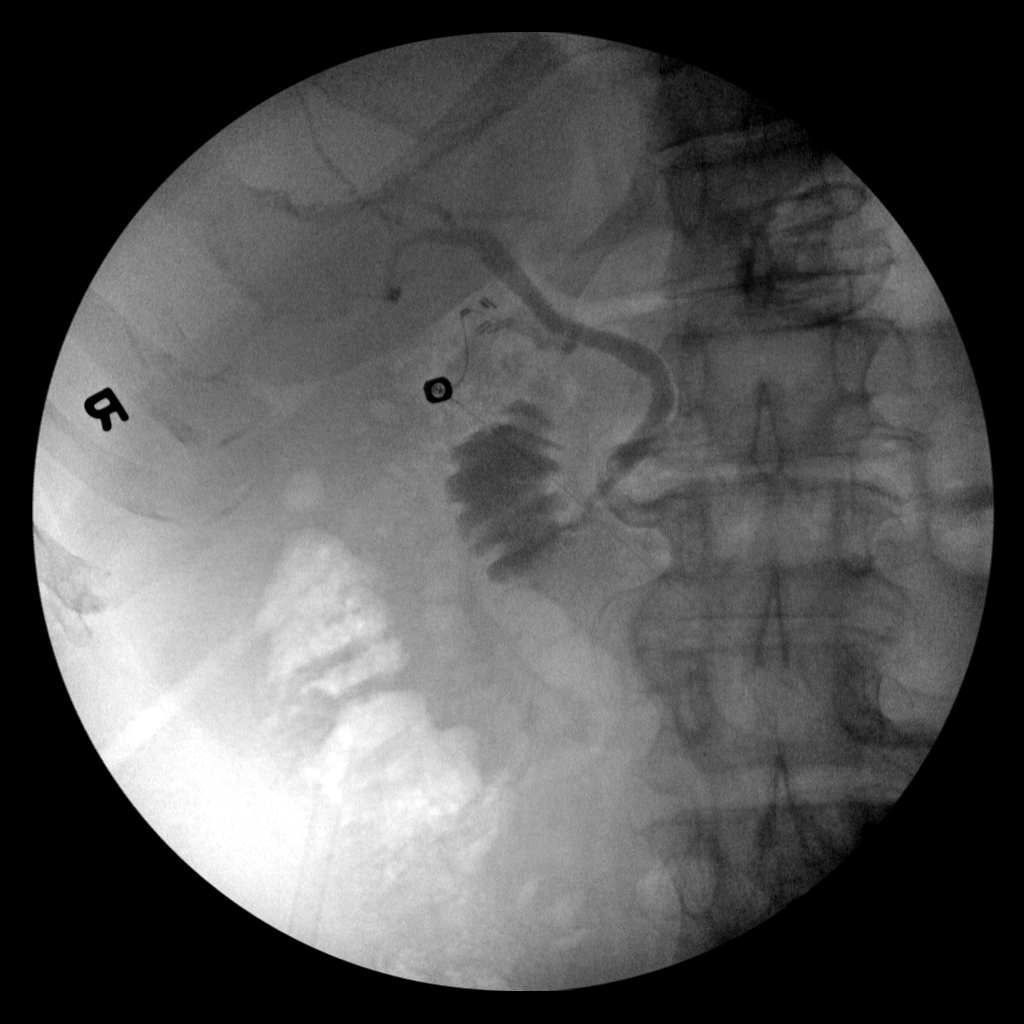

[5 of 5 positions shown; findings below may reference images not displayed]

FINDINGS: Contrast fills the biliary tree and duodenum without filling defects
in the common bile duct.
IMPRESSION: Patent biliary tree.

## 2020-08-19 ENCOUNTER — Telehealth: Payer: Self-pay | Admitting: Physician Assistant

## 2020-08-19 ENCOUNTER — Other Ambulatory Visit: Payer: Self-pay

## 2020-08-19 DIAGNOSIS — I10 Essential (primary) hypertension: Secondary | ICD-10-CM

## 2020-08-19 DIAGNOSIS — E782 Mixed hyperlipidemia: Secondary | ICD-10-CM

## 2020-08-19 MED ORDER — BENAZEPRIL HCL 40 MG PO TABS: 40.0000 mg | ORAL_TABLET | Freq: Every day | ORAL | 0 refills | Status: DC

## 2020-08-19 MED ORDER — HYDROCHLOROTHIAZIDE 12.5 MG PO CAPS: 12.5000 mg | ORAL_CAPSULE | Freq: Every day | ORAL | 0 refills | Status: DC

## 2020-08-19 MED ORDER — ATORVASTATIN CALCIUM 20 MG PO TABS: 20.0000 mg | ORAL_TABLET | Freq: Every day | ORAL | 0 refills | Status: DC

## 2020-08-19 NOTE — Telephone Encounter (Signed)
Medication sent to patient's preferred pharmacy

## 2020-08-19 NOTE — Telephone Encounter (Signed)
..  Medication Refills  Medication:  All 3 of his medications - patient did not have bottles so he wasn't sure what they are.  States he has enough until probably next Tuesday. Pharmacy:   [facility] - Fax #  [phone]  ** Let patient know to contact pharmacy at the end of the day to make sure medication is ready.**  ** Please notify patient to allow 48-72 hours to process.**  ** Encourage patient to contact the pharmacy for refills or they can request refills through [facility]**  Clinical Fills out below:   Last refill:  QTY:  Refill Date:    Other Comments:   Okay for refill?  Please advise.

## 2020-11-13 ENCOUNTER — Telehealth: Payer: Self-pay | Admitting: Physician Assistant

## 2020-11-13 DIAGNOSIS — I10 Essential (primary) hypertension: Secondary | ICD-10-CM

## 2020-11-13 DIAGNOSIS — E782 Mixed hyperlipidemia: Secondary | ICD-10-CM

## 2020-11-13 NOTE — Telephone Encounter (Signed)
..  Medication Refills  Last OV:  Medication:  hydrochloithizide 12.5 mg, Atorvastin 20 mg, and Benazapril hcl 40  Ph[facility]  Let patient know to contact pharmacy at the end of the day to make sure medication is ready.   Please notify patient to allow 48-72 hours to process.  Encourage patient to contact the pharmacy for refills or they can request refills through [place] out below:   Last refill:  QTY:  Refill Date:    Other Comments:   Okay for refill?  Please advise.

## 2020-11-14 MED ORDER — BENAZEPRIL HCL 40 MG PO TABS: 40.0000 mg | ORAL_TABLET | Freq: Every day | ORAL | 0 refills | Status: DC

## 2020-11-14 MED ORDER — ATORVASTATIN CALCIUM 20 MG PO TABS: 20.0000 mg | ORAL_TABLET | Freq: Every day | ORAL | 0 refills | Status: DC

## 2020-11-14 MED ORDER — HYDROCHLOROTHIAZIDE 12.5 MG PO CAPS: 12.5000 mg | ORAL_CAPSULE | Freq: Every day | ORAL | 0 refills | Status: DC

## 2020-11-14 NOTE — Telephone Encounter (Signed)
Rx sent to the preferred patient pharmacy  

## 2020-12-05 DIAGNOSIS — Z20822 Contact with and (suspected) exposure to covid-19: Secondary | ICD-10-CM | POA: Diagnosis not present

## 2021-02-18 ENCOUNTER — Other Ambulatory Visit: Payer: Self-pay | Admitting: Family

## 2021-02-18 DIAGNOSIS — E782 Mixed hyperlipidemia: Secondary | ICD-10-CM

## 2021-02-18 DIAGNOSIS — I10 Essential (primary) hypertension: Secondary | ICD-10-CM

## 2021-02-20 NOTE — Telephone Encounter (Signed)
Patient has scheduled TOC/ CPE with [name] on 04/10/2021.  Patient states he is going out of town 6/10.   Would like these scripts sent to [facility] in time to received these meds by mail before he leaves.

## 2021-03-31 ENCOUNTER — Encounter: Payer: Self-pay | Admitting: Family Medicine

## 2021-03-31 ENCOUNTER — Telehealth (INDEPENDENT_AMBULATORY_CARE_PROVIDER_SITE_OTHER): Payer: Medicare HMO | Admitting: Family Medicine

## 2021-03-31 VITALS — Temp 96.7°F

## 2021-03-31 DIAGNOSIS — U071 COVID-19: Secondary | ICD-10-CM | POA: Diagnosis not present

## 2021-03-31 MED ORDER — BENZONATATE 100 MG PO CAPS
100.0000 mg | ORAL_CAPSULE | Freq: Three times a day (TID) | ORAL | 0 refills | Status: DC | PRN
Start: 2021-03-31 — End: 2022-05-04

## 2021-03-31 MED ORDER — MOLNUPIRAVIR EUA 200MG CAPSULE: 4.0000 | ORAL_CAPSULE | Freq: Two times a day (BID) | ORAL | 0 refills | Status: AC

## 2021-03-31 NOTE — Progress Notes (Signed)
Virtual Visit via Telephone Note  I connected with Cameron [name] on 03/31/21 at 12:40 PM EDT by telephone and verified that I am speaking with the correct person using two identifiers.   I discussed the limitations, risks, security and privacy concerns of performing an evaluation and management service by telephone and the availability of in person appointments. I also discussed with the patient that there may be a patient responsible charge related to this service. The patient expressed understanding and agreed to proceed.  Location patient: home, [place] Location provider: work or home office Participants present for the call: patient, provider Patient did not have a visit with me in the prior 7 days to address this/these issue(s).   History of Present Illness:  Acute telemedicine visit for Covid19: -Onset: yesterday; had positive home covid test today -he thinks he got this from his daughter who has covid -Symptoms include: a little nasal congestion, cough, mild diarrhea, felt a little tired when working in the heat yesterday, otherwise grade fever last night -Denies:CP, SOB, vomiting, inability to eat/drink/get out of bed -Has tried: tylenol -Pertinent past medical history: see below -Pertinent medication allergies: No Known Allergies -COVID-19 vaccine status:2 doses and a booster -no recent labs  Past Medical History:  Diagnosis Date   Adenomatous colon polyp 05/2009; 11/2014   2010:Tubular adenoma, no high grade dysplasia.Marland Kitchen  + Hyperplastic polyps. 2016: Tubular adenoma-rpt 5 yrs.   Coronary artery disease    H/O chronic gastritis 05/2009   EGD: no h.pylori,dysplasia,or evidence of malignancy   Hyperlipidemia    lovaza from prior PMD-stopped due to cost   Hypertension    Obesity, Class I, BMI 30.0-34.9 (see actual BMI) 04/23/2014   Tobacco dependence     Observations/Objective: Patient sounds cheerful and well on the phone. I do not appreciate any SOB. Speech and thought  processing are grossly intact. Patient reported vitals:  Assessment and Plan:  No diagnosis found.   Discussed treatment options, ideal treatment window, potential complications, isolation and precautions for COVID-19.  After lengthy discussion, the patient opted for treatment with Molnupiravir due to being higher risk for complications of covid or severe disease and other factors. Discussed EUA status of this drug and the fact that there is preliminary limited knowledge of risks/interactions/side effects per EUA document vs possible benefits and precautions. This information was shared with patient during the visit and also was provided in patient instructions. Also, advised that patient discuss risks/interactions and use with pharmacist/treatment team as well. The patient did want a prescription for cough, Tessalon Rx sent.  Other symptomatic care measures summarized in patient instructions. Work/School slipped offered: provided in patient instructions Advised to seek prompt in person care if worsening, new symptoms arise, or if is not improving with treatment. Advised of options for inperson care in case PCP office not available. Did let the patient know that I only do telemedicine shifts for [place] on Tuesdays and Thursdays and advised a follow up visit with PCP or at an [facility] if has further questions or concerns.   Follow Up Instructions:  I did not refer this patient for an OV with me in the next 24 hours for this/these issue(s).  I discussed the assessment and treatment plan with the patient. The patient was provided an opportunity to ask questions and all were answered. The patient agreed with the plan and demonstrated an understanding of the instructions.   I spent 18 minutes on the date of this visit in the care of this patient.  See summary of tasks completed to properly care for this patient in the detailed notes above which also included counseling of above, review of PMH, medications,  allergies, evaluation of the patient and ordering and/or  instructing patient on testing and care options.     Lucretia Kern, DO

## 2021-03-31 NOTE — Patient Instructions (Addendum)
---------------------------------------------------------------------------------------------------------------------------    WORK SLIP:  Patient Cameron [name],  1950-10-11, was seen for a medical visit today, 03/31/21 . Please excuse from work for a COVID like illness. We advise 10 days minimum from the onset of symptoms (03/30/21) PLUS 1 day of no fever and improved symptoms. Will defer to employer for a sooner return to work if symptoms have resolved, it is greater than 5 days since the positive test and the patient can wear a high-quality, tight fitting mask such as N95 or KN95 at all times for an additional 5 days. Would also suggest COVID19 antigen testing is negative prior to return.  Sincerely: E-signature: Dr. Colin Benton, DO [facility] Ph: [phone]   ------------------------------------------------------------------------------------------------------------------------------   HOME CARE TIPS:  -I sent the medication(s) we discussed to your pharmacy: Meds ordered this encounter  Medications   molnupiravir EUA 200 mg CAPS    Sig: Take 4 capsules (800 mg total) by mouth 2 (two) times daily for 5 days.    Dispense:  40 capsule    Refill:  0   benzonatate (TESSALON PERLES) 100 MG capsule    Sig: Take 1 capsule (100 mg total) by mouth 3 (three) times daily as needed.    Dispense:  20 capsule    Refill:  0      -I sent in the [place] treatment or referral you requested per our discussion. Please see the information provided below and discuss further with the pharmacist/treatment team.   -can use nasal saline a few times per day if you have nasal congestion  -stay hydrated, drink plenty of fluids and eat small healthy meals - avoid dairy  -can take 1000 IU (28mg) Vit D3 and 100-500 mg of Vit C daily per instructions  -If the Covid test is positive, [facility] for more information on home care, transmission and treatment for  COVID19  -follow up with your doctor in 2-3 days unless improving and feeling better  -stay home while sick, except to seek medical care. If you have COVID19, ideally it would be best to stay home for a full 10 days since the onset of symptoms PLUS one day of no fever and feeling better. Wear a good mask that fits snugly (such as N95 or KN95) if around others to reduce the risk of transmission.  It was nice to meet you today, and I really hope you are feeling better soon. I help [place] out with telemedicine visits on Tuesdays and Thursdays and am available for visits on those days. If you have any concerns or questions following this visit please schedule a follow up visit with your Primary Care doctor or seek care at a local urgent care clinic to avoid delays in care.    Seek in person care or schedule a follow up video visit promptly if your symptoms worsen, new concerns arise or you are not improving with treatment. Call 911 and/or seek emergency care if your symptoms are severe or life threatening.    Fact Sheet for Patients And Caregivers Emergency Use Authorization (EUA) Of LAGEVRIOT (molnupiravir) capsules For Coronavirus Disease 2019 (COVID-19)  What is the most important information I should know about LAGEVRIO? LAGEVRIO may cause serious side effects, including: ? LAGEVRIO may cause harm to your unborn baby. It is not known if LAGEVRIO will harm your baby if you take LAGEVRIO during pregnancy. o LAGEVRIO is not recommended for use in pregnancy. o LAGEVRIO has not  been studied in pregnancy. LAGEVRIO was studied in pregnant animals only. When LAGEVRIO was given to pregnant animals, LAGEVRIO caused harm to their unborn babies. o You and your healthcare provider may decide that you should take LAGEVRIO during pregnancy if there are no other COVID-19 treatment options approved or authorized by the FDA that are accessible or clinically appropriate for you. o If you and your  healthcare provider decide that you should take LAGEVRIO during pregnancy, you and your healthcare provider should discuss the known and potential benefits and the potential risks of taking LAGEVRIO during pregnancy. For individuals who are able to become pregnant: ? You should use a reliable method of birth control (contraception) consistently and correctly during treatment with LAGEVRIO and for 4 days after the last dose of LAGEVRIO. Talk to your healthcare provider about reliable birth control methods. ? Before starting treatment with [facility] your healthcare provider may do a pregnancy test to see if you are pregnant before starting treatment with LAGEVRIO. ? Tell your healthcare provider right away if you become pregnant or think you may be pregnant during treatment with LAGEVRIO. Pregnancy Surveillance Program: ? There is a pregnancy surveillance program for individuals who take LAGEVRIO during pregnancy. The purpose of this program is to collect information about the health of you and your baby. Talk to your healthcare provider about how to take part in this program. ? If you take LAGEVRIO during pregnancy and you agree to participate in the pregnancy surveillance program and allow your healthcare provider to share your information with [place], then your healthcare provider will report your use of [place] during pregnancy to [place]. by calling [phone] or PeacefulBlog.es. For individuals who are sexually active with partners who are able to become pregnant: ? It is not known if LAGEVRIO can affect sperm. While the risk is regarded as low, animal studies to fully assess the potential for LAGEVRIO to affect the babies of males treated with LAGEVRIO have not been completed. A reliable method of birth control (contraception) should be used consistently and correctly during treatment with LAGEVRIO and for at least 3 months after the last  dose. The risk to sperm beyond 3 months is not known. Studies to understand the risk to sperm beyond 3 months are ongoing. Talk to your healthcare provider about reliable birth control methods. Talk to your healthcare provider if you have questions or concerns about how LAGEVRIO may affect sperm. You are being given this fact sheet because your healthcare provider believes it is necessary to provide you with LAGEVRIO for the treatment of adults with mild-to-moderate coronavirus disease 2019 (COVID-19) with positive results of direct SARS-CoV-2 viral testing, and who are at high risk for progression to severe COVID-19 including hospitalization or death, and for whom other COVID-19 treatment options approved or authorized by the FDA are not accessible or clinically appropriate. The U.S. Food and Drug Administration (FDA) has issued an Emergency Use Authorization (EUA) to make LAGEVRIO available during the COVID-19 pandemic (for more details about an EUA please see "What is an Emergency Use Authorization?" at the end of this document). LAGEVRIO is not an FDA-approved medicine in the [place]. Read this Fact Sheet for information about LAGEVRIO. Talk to your healthcare provider about your options if you have any questions. It is your choice to take LAGEVRIO.  What is COVID-19? COVID-19 is caused by a virus called a coronavirus. You can get COVID-19 through close contact with another person who has  the virus. COVID-19 illnesses have ranged from very mild-to-severe, including illness resulting in death. While information so far suggests that most COVID-19 illness is mild, serious illness can happen and may cause some of your other medical conditions to become worse. Older people and people of all ages with severe, long lasting (chronic) medical conditions like heart disease, lung disease and diabetes, for example seem to be at higher risk of being hospitalized for COVID-19.  What is  LAGEVRIO? LAGEVRIO is an investigational medicine used to treat mild-to-moderate COVID-19 in adults: ? with positive results of direct SARS-CoV-2 viral testing, and ? who are at high risk for progression to severe COVID-19 including hospitalization or death, and for whom other COVID-19 treatment options approved or authorized by the FDA are not accessible or clinically appropriate. The FDA has authorized the emergency use of LAGEVRIO for the treatment of mild-tomoderate COVID-19 in adults under an EUA. For more information on EUA, see the "What is an Emergency Use Authorization (EUA)?" section at the end of this Fact Sheet. LAGEVRIO is not authorized: ? for use in people less than 56 years of age. ? for prevention of COVID-19. ? for people needing hospitalization for COVID-19. ? for use for longer than 5 consecutive days.  What should I tell my healthcare provider before I take LAGEVRIO? Tell your healthcare provider if you: ? Have any allergies ? Are breastfeeding or plan to breastfeed ? Have any serious illnesses ? Are taking any medicines (prescription, over-the-counter, vitamins, or herbal products).  How do I take LAGEVRIO? ? Take LAGEVRIO exactly as your healthcare provider tells you to take it. ? Take 4 capsules of LAGEVRIO every 12 hours (for example, at 8 am and at 8 pm) ? Take LAGEVRIO for 5 days. It is important that you complete the full 5 days of treatment with LAGEVRIO. Do not stop taking LAGEVRIO before you complete the full 5 days of treatment, even if you feel better. ? Take LAGEVRIO with or without food. ? You should stay in isolation for as long as your healthcare provider tells you to. Talk to your healthcare provider if you are not sure about how to properly isolate while you have COVID-19. ? Swallow LAGEVRIO capsules whole. Do not open, break, or crush the capsules. If you cannot swallow capsules whole, tell your healthcare provider. ? What to do if you miss a  dose: o If it has been less than 10 hours since the missed dose, take it as soon as you remember o If it has been more than 10 hours since the missed dose, skip the missed dose and take your dose at the next scheduled time. ? Do not double the dose of LAGEVRIO to make up for a missed dose.  What are the important possible side effects of LAGEVRIO? ? See, "What is the most important information I should know about LAGEVRIO?" ? Allergic Reactions. Allergic reactions can happen in people taking LAGEVRIO, even after only 1 dose. Stop taking LAGEVRIO and call your healthcare provider right away if you get any of the following symptoms of an allergic reaction: o hives o rapid heartbeat o trouble swallowing or breathing o swelling of the mouth, lips, or face o throat tightness o hoarseness o skin rash The most common side effects of LAGEVRIO are: ? diarrhea ? nausea ? dizziness These are not all the possible side effects of LAGEVRIO. Not many people have taken LAGEVRIO. Serious and unexpected side effects may happen. This medicine is still being  studied, so it is possible that all of the risks are not known at this time.  What other treatment choices are there?  Veklury (remdesivir) is FDA-approved as an intravenous (IV) infusion for the treatment of mildto-moderate IZTIW-58 in certain adults and children. Talk with your doctor to see if Marijean Heath is appropriate for you. Like LAGEVRIO, FDA may also allow for the emergency use of other medicines to treat people with COVID-19. Go to LacrosseProperties.si for more information. It is your choice to be treated or not to be treated with LAGEVRIO. Should you decide not to take it, it will not change your standard medical care.  What if I am breastfeeding? Breastfeeding is not recommended during treatment with LAGEVRIO and for 4 days after  the last dose of LAGEVRIO. If you are breastfeeding or plan to breastfeed, talk to your healthcare provider about your options and specific situation before taking LAGEVRIO.  How do I report side effects with LAGEVRIO? Contact your healthcare provider if you have any side effects that bother you or do not go away. Report side effects to FDA MedWatch at SmoothHits.hu or call 1-800-FDA-1088 (1- 609-140-6658).  How should I store [place]? ? Store LAGEVRIO capsules at room temperature between 67F to 51F (20C to 25C). ? Keep LAGEVRIO and all medicines out of the reach of children and pets. How can I learn more about COVID-19? ? Ask your healthcare provider. ? Visit SeekRooms.co.uk ? Contact your local or state public health department. ? Call [place] at [phone] (toll free in the [place].) ? Visit www.molnupiravir.com  What Is an Emergency Use Authorization (EUA)? The [place] FDA has made [place] available under an emergency access mechanism called an Emergency Use Authorization (EUA) The EUA is supported by a Presenter, broadcasting Health and Human Service (HHS) declaration that circumstances exist to justify emergency use of drugs and biological products during the COVID-19 pandemic. LAGEVRIO for the treatment of mild-to-moderate COVID-19 in adults with positive results of direct SARS-CoV-2 viral testing, who are at high risk for progression to severe COVID-19, including hospitalization or death, and for whom alternative COVID-19 treatment options approved or authorized by FDA are not accessible or clinically appropriate, has not undergone the same type of review as an FDA-approved product. In issuing an EUA under the LZJQB-34 public health emergency, the FDA has determined, among other things, that based on the total amount of scientific evidence available including data from adequate and well-controlled clinical trials, if available, it is reasonable to  believe that the product may be effective for diagnosing, treating, or preventing COVID-19, or a serious or life-threatening disease or condition caused by COVID-19; that the known and potential benefits of the product, when used to diagnose, treat, or prevent such disease or condition, outweigh the known and potential risks of such product; and that there are no adequate, approved, and available alternatives.  All of these criteria must be met to allow for the product to be used in the treatment of patients during the COVID-19 pandemic. The EUA for LAGEVRIO is in effect for the duration of the COVID-19 declaration justifying emergency use of LAGEVRIO, unless terminated or revoked (after which LAGEVRIO may no longer be used under the EUA). For patent information: http://rogers.info/ Copyright  2021-2022 [place], [place] and its affiliates. All rights reserved. [id number] Revised: March 2022

## 2021-04-06 ENCOUNTER — Telehealth: Payer: Self-pay | Admitting: *Deleted

## 2021-04-06 NOTE — Chronic Care Management (AMB) (Signed)
  Chronic Care Management   Note  04/06/2021 Name: Cameron [name] MRN: [id number] DOB: 1951-02-14  Cameron [name] is a 70 y.o. year old male who is a primary care patient of Delorse Limber. I reached out to [employer] by phone today in response to a referral sent by Cameron [name] PCP Brunetta Jeans, PA-C     Cameron [name] was given information about Chronic Care Management services today including:  CCM service includes personalized support from designated clinical staff supervised by his physician, including individualized plan of care and coordination with other care providers 24/7 contact phone numbers for assistance for urgent and routine care needs. Service will only be billed when office clinical staff spend 20 minutes or more in a month to coordinate care. Only one practitioner may furnish and bill the service in a calendar month. The patient may stop CCM services at any time (effective at the end of the month) by phone call to the office staff. The patient will be responsible for cost sharing (co-pay) of up to 20% of the service fee (after annual deductible is met).  Patient agreed to services and verbal consent obtained.   Follow up plan: Telephone appointment with care management team member scheduled for:04/15/2021  Julian Hy, [place], [facility]  Direct Dial: [phone]

## 2021-04-10 ENCOUNTER — Ambulatory Visit (INDEPENDENT_AMBULATORY_CARE_PROVIDER_SITE_OTHER): Payer: Medicare HMO | Admitting: Registered Nurse

## 2021-04-10 ENCOUNTER — Other Ambulatory Visit: Payer: Self-pay

## 2021-04-10 ENCOUNTER — Encounter: Payer: Self-pay | Admitting: Registered Nurse

## 2021-04-10 VITALS — BP 129/66 | HR 62 | Temp 98.2°F | Resp 18 | Ht 70.0 in | Wt 212.2 lb

## 2021-04-10 DIAGNOSIS — I10 Essential (primary) hypertension: Secondary | ICD-10-CM | POA: Diagnosis not present

## 2021-04-10 DIAGNOSIS — Z8616 Personal history of COVID-19: Secondary | ICD-10-CM

## 2021-04-10 DIAGNOSIS — Z7689 Persons encountering health services in other specified circumstances: Secondary | ICD-10-CM | POA: Diagnosis not present

## 2021-04-10 DIAGNOSIS — E782 Mixed hyperlipidemia: Secondary | ICD-10-CM

## 2021-04-10 DIAGNOSIS — Z125 Encounter for screening for malignant neoplasm of prostate: Secondary | ICD-10-CM

## 2021-04-10 DIAGNOSIS — R7309 Other abnormal glucose: Secondary | ICD-10-CM | POA: Diagnosis not present

## 2021-04-10 LAB — COMPREHENSIVE METABOLIC PANEL
ALT: 22 U/L (ref 0–53)
AST: 15 U/L (ref 0–37)
Albumin: 4.3 g/dL (ref 3.5–5.2)
Alkaline Phosphatase: 108 U/L (ref 39–117)
BUN: 7 mg/dL (ref 6–23)
CO2: 31 mEq/L (ref 19–32)
Calcium: 9.3 mg/dL (ref 8.4–10.5)
Chloride: 96 mEq/L (ref 96–112)
Creatinine, Ser: 0.83 mg/dL (ref 0.40–1.50)
GFR: 90.2 mL/min (ref >60.00)
Glucose, Bld: 107 mg/dL — ABNORMAL HIGH (ref 70–99)
Potassium: 4.4 mEq/L (ref 3.5–5.1)
Sodium: 135 mEq/L (ref 135–145)
Total Bilirubin: 0.7 mg/dL (ref 0.2–1.2)
Total Protein: 6.6 g/dL (ref 6.0–8.3)

## 2021-04-10 LAB — CBC WITH DIFFERENTIAL/PLATELET
Basophils Absolute: 0.1 10*3/uL (ref 0.0–0.1)
Basophils Relative: 0.9 % (ref 0.0–3.0)
Eosinophils Absolute: 0.4 10*3/uL (ref 0.0–0.7)
Eosinophils Relative: 5 % (ref 0.0–5.0)
HCT: 52.2 % — ABNORMAL HIGH (ref 39.0–52.0)
Hemoglobin: 17.8 g/dL — ABNORMAL HIGH (ref 13.0–17.0)
Lymphocytes Relative: 21.2 % (ref 12.0–46.0)
Lymphs Abs: 1.7 10*3/uL (ref 0.7–4.0)
MCHC: 34.1 g/dL (ref 30.0–36.0)
MCV: 94 fl (ref 78.0–100.0)
Monocytes Absolute: 0.8 10*3/uL (ref 0.1–1.0)
Monocytes Relative: 9.5 % (ref 3.0–12.0)
Neutro Abs: 5 10*3/uL (ref 1.4–7.7)
Neutrophils Relative %: 63.4 % (ref 43.0–77.0)
Platelets: 296 10*3/uL (ref 150.0–400.0)
RBC: 5.55 Mil/uL (ref 4.22–5.81)
RDW: 14.5 % (ref 11.5–15.5)
WBC: 7.9 10*3/uL (ref 4.0–10.5)

## 2021-04-10 LAB — LIPID PANEL
Cholesterol: 152 mg/dL (ref 0–200)
HDL: 42.6 mg/dL (ref >39.00)
LDL Cholesterol: 91 mg/dL (ref 0–99)
NonHDL: 109.82
Total CHOL/HDL Ratio: 4
Triglycerides: 93 mg/dL (ref 0.0–149.0)
VLDL: 18.6 mg/dL (ref 0.0–40.0)

## 2021-04-10 LAB — PSA, MEDICARE: PSA: 0.76 ng/ml (ref 0.10–4.00)

## 2021-04-10 LAB — HEMOGLOBIN A1C: Hgb A1c MFr Bld: 6.4 % (ref 4.6–6.5)

## 2021-04-10 NOTE — Progress Notes (Signed)
Established Patient Office Visit  Subjective:  Patient ID: Cameron [name], male    DOB: 1950/11/30  Age: 70 y.o. MRN: [id number]  CC:  Chief Complaint  Patient presents with   Transitions Of Care    Patient states he is here for a TOC. Patient states he also needed a medication refill but 90 days was sent to the pharmacy .    HPI Cameron [name] presents for visit to est care  Follow up on htn and hld  Hypertension: Patient Currently taking: benazepril 40mg  PO qd, hctz 12.5mg  Po qd Good effect. No AEs. Denies CV symptoms including: chest pain, shob, doe, headache, visual changes, fatigue, claudication, and dependent edema.   Previous readings and labs: BP Readings from Last 3 Encounters:  04/10/21 129/66  05/12/20 124/80  05/05/20 (!) 160/90   Lab Results  Component Value Date   CREATININE 0.83 04/10/2021    HLD Taking atorvastatin 20mg  PO qd.  Good effect No Aes   Past Medical History:  Diagnosis Date   Adenomatous colon polyp 05/2009; 11/2014   2010:Tubular adenoma, no high grade dysplasia.Marland Kitchen  + Hyperplastic polyps. 2016: Tubular adenoma-rpt 5 yrs.   Coronary artery disease    H/O chronic gastritis 05/2009   EGD: no h.pylori,dysplasia,or evidence of malignancy   Hyperlipidemia    lovaza from prior PMD-stopped due to cost   Hypertension    Obesity, Class I, BMI 30.0-34.9 (see actual BMI) 04/23/2014   Tobacco dependence     Past Surgical History:  Procedure Laterality Date   APPENDECTOMY  1973   CHOLECYSTECTOMY N/A 07/06/2018   Procedure: LAPAROSCOPIC CHOLECYSTECTOMY WITH INTRAOPERATIVE CHOLANGIOGRAM;  Surgeon: Alphonsa Overall, MD;  Location: [facility];  Service: General;  Laterality: N/A;   COLONOSCOPY  2010   [facility]   EYE SURGERY     STRABISMUS SURGERY  age 53 or 4    Family History  Problem Relation Age of Onset   Heart disease Mother    COPD Father    Hypertension Father    Hyperlipidemia Father    Diabetes Father    Heart attack Father    Dementia  Father    Alcohol abuse Maternal Grandfather     Social History   Socioeconomic History   Marital status: Widowed    Spouse name: Not on file   Number of children: 2   Years of education: Not on file   Highest education level: Not on file  Occupational History   Occupation: Retired    Comment: 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   Works with entertainment companies to set up venues    Orig from [place], has lived in [place] since 1970.   Tobacco 80 pack-yr hx, ongoing as of 03/2014.   Rare alcohol.  No drug use except DISTANT use of marijuana.   Social Determinants of Health   Financial Resource Strain: Not on file  Food Insecurity: Not on file  Transportation Needs: Not on  file  Physical Activity: Not on file  Stress: Not on file  Social Connections: Not on file  Intimate Partner Violence: Not on file    Outpatient Medications Prior to Visit  Medication Sig Dispense Refill   aspirin 81 MG tablet Take 81 mg by mouth daily.     atorvastatin (LIPITOR) 20 MG tablet TAKE 1 TABLET DAILY 90 tablet 0   benazepril (LOTENSIN) 40 MG tablet TAKE 1 TABLET DAILY 90 tablet 0   benzonatate (TESSALON PERLES) 100 MG capsule Take 1 capsule (100 mg total) by mouth 3 (three) times daily as needed. 20 capsule 0   hydrochlorothiazide (MICROZIDE) 12.5 MG capsule TAKE 1 CAPSULE DAILY 90 capsule 0   No facility-administered medications prior to visit.    No Known Allergies  ROS Review of Systems  Constitutional: Negative.   HENT: Negative.    Eyes: Negative.   Respiratory:  Negative.    Cardiovascular: Negative.   Gastrointestinal: Negative.   Genitourinary: Negative.   Musculoskeletal: Negative.   Skin: Negative.   Neurological: Negative.   Psychiatric/Behavioral: Negative.    All other systems reviewed and are negative.    Objective:    Physical Exam Constitutional:      General: He is not in acute distress.    Appearance: Normal appearance. He is normal weight. He is not ill-appearing, toxic-appearing or diaphoretic.  Cardiovascular:     Rate and Rhythm: Normal rate and regular rhythm.     Heart sounds: Normal heart sounds. No murmur heard.   No friction rub. No gallop.  Pulmonary:     Effort: Pulmonary effort is normal. No respiratory distress.     Breath sounds: Normal breath sounds. No stridor. No wheezing, rhonchi or rales.  Chest:     Chest wall: No tenderness.  Neurological:     General: No focal deficit present.     Mental Status: He is alert and oriented to person, place, and time. Mental status is at baseline.  Psychiatric:        Mood and Affect: Mood normal.        Behavior: Behavior normal.        Thought Content: Thought content normal.        Judgment: Judgment normal.    BP 129/66   Pulse 62   Temp 98.2 F (36.8 C) (Temporal)   Resp 18   Ht 5\' 10"  (1.778 m)   Wt 212 lb 3.2 oz (96.3 kg)   SpO2 98%   BMI 30.45 kg/m  Wt Readings from Last 3 Encounters:  04/10/21 212 lb 3.2 oz (96.3 kg)  05/12/20 206 lb (93.4 kg)  05/05/20 207 lb (93.9 kg)     There are no preventive care reminders to display for this patient.  There are no preventive care reminders to display for this patient.  Lab Results  Component Value Date   TSH 1.82 04/16/2015   Lab Results  Component Value Date   WBC 7.9 04/10/2021   HGB 17.8 (H) 04/10/2021   HCT 52.2 (H) 04/10/2021   MCV 94.0 04/10/2021   PLT 296.0 04/10/2021   Lab Results  Component Value Date   NA 135 04/10/2021   K 4.4 04/10/2021   CO2 31 04/10/2021   GLUCOSE 107 (H)  04/10/2021   BUN 7 04/10/2021   CREATININE 0.83 04/10/2021   BILITOT 0.7 04/10/2021   ALKPHOS 108 04/10/2021   AST 15 04/10/2021   ALT 22 04/10/2021   PROT 6.6 04/10/2021   ALBUMIN 4.3  04/10/2021   CALCIUM 9.3 04/10/2021   ANIONGAP 8 06/28/2018   GFR 90.20 04/10/2021   Lab Results  Component Value Date   CHOL 152 04/10/2021   Lab Results  Component Value Date   HDL 42.60 04/10/2021   Lab Results  Component Value Date   LDLCALC 91 04/10/2021   Lab Results  Component Value Date   TRIG 93.0 04/10/2021   Lab Results  Component Value Date   CHOLHDL 4 04/10/2021   Lab Results  Component Value Date   HGBA1C 6.4 04/10/2021      Assessment & Plan:   Problem List Items Addressed This Visit       Cardiovascular and Mediastinum   HTN (hypertension), benign   Relevant Orders   CBC with Differential/Platelet (Completed)   Comprehensive metabolic panel (Completed)     Other   Hyperlipemia, mixed - Primary   Relevant Orders   Lipid panel (Completed)   Other Visit Diagnoses     Prostate cancer screening       Relevant Orders   PSA, Medicare ( [place] only) (Completed)   Elevated glucose       Relevant Orders   Hemoglobin A1c (Completed)   Encounter to establish care       History of COVID-19           No orders of the defined types were placed in this encounter.   Follow-up: Return in about 6 months (around 10/11/2021) for HTN, HLD.   PLAN Labs collected. Will follow up with the patient as warranted. Can refill meds x 6 mo Return at that time for follow up  Patient encouraged to call clinic with any questions, comments, or concerns.  Maximiano Coss, NP

## 2021-04-10 NOTE — Patient Instructions (Addendum)
Mr Cameron [name] to meet you  Let me know if you need anything  Labs today should be back tomorrow or later today  I'll be in touch. Let's plan on a 6 mo follow up   Thank you  [name]     If you have lab work done today you will be contacted with your lab results within the next 2 weeks.  If you have not heard from [place] then please contact us. The fastest way to get your results is to register for My Chart.   IF you received an x-ray today, you will receive an invoice from [facility]. Please contact [facility] at [phone] with questions or concerns regarding your invoice.   IF you received labwork today, you will receive an invoice from [facility]. Please contact [facility] at [phone] with questions or concerns regarding your invoice.   Our billing staff will not be able to assist you with questions regarding bills from these companies.  You will be contacted with the lab results as soon as they are available. The fastest way to get your results is to activate your My Chart account. Instructions are located on the last page of this paperwork. If you have not heard from [place] regarding the results in 2 weeks, please contact this office.

## 2021-04-15 ENCOUNTER — Ambulatory Visit (INDEPENDENT_AMBULATORY_CARE_PROVIDER_SITE_OTHER): Payer: Medicare HMO

## 2021-04-15 DIAGNOSIS — F17200 Nicotine dependence, unspecified, uncomplicated: Secondary | ICD-10-CM

## 2021-04-15 DIAGNOSIS — R7309 Other abnormal glucose: Secondary | ICD-10-CM

## 2021-04-15 DIAGNOSIS — I10 Essential (primary) hypertension: Secondary | ICD-10-CM | POA: Diagnosis not present

## 2021-04-15 DIAGNOSIS — E782 Mixed hyperlipidemia: Secondary | ICD-10-CM | POA: Diagnosis not present

## 2021-04-15 NOTE — Patient Instructions (Signed)
Visit Information   PATIENT GOALS:   Goals Addressed             This Visit's Progress    RNCM:Eat Healthy   On track    Timeframe:  Long-Range Goal Priority:  High Start Date:   04/15/21                          Expected End Date:    08/27/21                   Follow Up Date 06/03/21    - change to whole grain breads, cereal, pasta - drink 6 to 8 glasses of water each day - fill half of plate with vegetables - limit fast food meals to no more than 1 per week - manage portion size - prepare main meal at home 3 to 5 days each week - read food labels for fat, fiber, carbohydrates and portion size - reduce red meat to 2 to 3 times a week - switch to low-fat or skim milk - switch to sugar-free drinks    Why is this important?   When you are ready to manage your nutrition or weight, having a plan and setting goals will help.  Taking small steps to change how you eat and exercise is a good place to start.    Notes:      RNCM:Manage My Cholesterol   On track    Timeframe:  Long-Range Goal Priority:  Medium Start Date:       04/15/21                      Expected End Date:   08/27/21                    Follow Up Date 06/03/21    - change to whole grain breads, cereal, pasta - eat smaller or less servings of red meat - fill half the plate with nonstarchy vegetables - get blood test (fasting) done 1 week before next visit - increase the amount of fiber in food - read food labels for fat and fiber  -  work up to  exercise 150 minutes a week including two sessions of resistance exercise weekly   Why is this important?   Changing cholesterol starts with eating heart-healthy foods.  Other steps may be to increase your activity and to quit if you smoke.    Notes:      RNCM:Track and Manage My Blood Pressure-Hypertension   On track    Timeframe:  Long-Range Goal Priority:  Medium Start Date:        04/15/21                     Expected End Date:     08/27/21                   Follow Up Date 06/03/21    - check blood pressure weekly - choose a place to take my blood pressure (home, clinic or office, retail store) - write blood pressure results in a log or diary    Why is this important?   You won't feel high blood pressure, but it can still hurt your blood vessels.  High blood pressure can cause heart or kidney problems. It can also cause a stroke.  Making lifestyle changes like losing a little weight or eating less salt  will help.  Checking your blood pressure at home and at different times of the day can help to control blood pressure.  If the doctor prescribes medicine remember to take it the way the doctor ordered.  Call the office if you cannot afford the medicine or if there are questions about it.     Notes:       Diabetes Care, 44(Suppl 1), S34-S39. https://doi.org/https://doi.org/10.2337/dc21-[email or web address]">  Prediabetes Prediabetes is when your blood sugar (blood glucose) level is higher than normal but not high enough for you to be diagnosed with type 2 diabetes. Having prediabetes puts you at risk for developing type 2 diabetes (type 2 diabetes mellitus). With certain lifestyle changes, you may be able to prevent or delay the onset of type 2 diabetes. This is important because type 2 diabetes can lead to serious complications, such as: Heart disease. Stroke. Blindness. Kidney disease. Depression. Poor circulation in the feet and legs. In severe cases, this could lead to surgical removal of a leg (amputation). What are the causes? The exact cause of prediabetes is not known. It may result from insulin resistance. Insulin resistance develops when cells in the body do not respond properly to insulin that the body makes. This can cause excess glucose to build up in the blood. High blood glucose (hyperglycemia) can develop. What increases the risk? The following factors may make you more likely to develop this condition: You have a family member with type 2  diabetes. You are older than 45 years. You had a temporary form of diabetes during a pregnancy (gestational diabetes). You had polycystic ovary syndrome (PCOS). You are overweight or obese. You are inactive (sedentary). You have a history of heart disease, including problems with cholesterol levels, high levels of blood fats, or high blood pressure. What are the signs or symptoms? You may have no symptoms. If you do have symptoms, they may include: Increased hunger. Increased thirst. Increased urination. Vision changes, such as blurry vision. Tiredness (fatigue). How is this diagnosed? This condition can be diagnosed with blood tests. Your blood glucose may be checked with one or more of the following tests: A fasting blood glucose (FBG) test. You will not be allowed to eat (you will fast) for at least 8 hours before a blood sample is taken. An A1C blood test (hemoglobin A1C). This test provides information about blood glucose levels over the previous 2?3 months. An oral glucose tolerance test (OGTT). This test measures your blood glucose at two points in time: After fasting. This is your baseline level. Two hours after you drink a beverage that contains glucose. You may be diagnosed with prediabetes if: Your FBG is 100?125 mg/dL (5.6-6.9 mmol/L). Your A1C level is 5.7?6.4% (39-46 mmol/mol). Your OGTT result is 140?199 mg/dL (7.8-11 mmol/L). These blood tests may be repeated to confirm your diagnosis. How is this treated? Treatment may include dietary and lifestyle changes to help lower your blood glucose and prevent type 2 diabetes from developing. In some cases, medicinemay be prescribed to help lower the risk of type 2 diabetes. Follow these instructions at home: Nutrition  Follow a healthy meal plan. This includes eating lean proteins, whole grains, legumes, fresh fruits and vegetables, low-fat dairy products, and healthy fats. Follow instructions from your health care provider  about eating or drinking restrictions. Meet with a dietitian to create a healthy eating plan that is right for you.  Lifestyle Do moderate-intensity exercise for at least 30 minutes a day on 5 or more days each week,  or as told by your health care provider. A mix of activities may be best, such as: Brisk walking, swimming, biking, and weight lifting. Lose weight as told by your health care provider. Losing 5-7% of your body weight can reverse insulin resistance. Do not drink alcohol if: Your health care provider tells you not to drink. You are pregnant, may be pregnant, or are planning to become pregnant. If you drink alcohol: Limit how much you use to: 0-1 drink a day for women. 0-2 drinks a day for men. Be aware of how much alcohol is in your drink. In the [place] one drink equals one 12 oz bottle of beer (355 mL), one 5 oz glass of wine (148 mL), or one 1 oz glass of hard liquor (44 mL). General instructions Take over-the-counter and prescription medicines only as told by your health care provider. You may be prescribed medicines that help lower the risk of type 2 diabetes. Do not use any products that contain nicotine or tobacco, such as cigarettes, e-cigarettes, and chewing tobacco. If you need help quitting, ask your health care provider. Keep all follow-up visits. This is important. Where to find more information American Diabetes Association: www.diabetes.org Academy of Nutrition and Dietetics: www.eatright.org American Heart Association: www.heart.org Contact a health care provider if: You have any of these symptoms: Increased hunger. Increased urination. Increased thirst. Fatigue. Vision changes, such as blurry vision. Get help right away if you: Have shortness of breath. Feel confused. Vomit or feel like you may vomit. Summary Prediabetes is when your blood sugar (blood glucose)level is higher than normal but not high enough for you to be diagnosed with type 2  diabetes. Having prediabetes puts you at risk for developing type 2 diabetes (type 2 diabetes mellitus). Make lifestyle changes such as eating a healthy diet and exercising regularly to help prevent diabetes. Lose weight as told by your health care provider. This information is not intended to replace advice given to you by your health care provider. Make sure you discuss any questions you have with your healthcare provider. Document Revised: 12/13/2019 Document Reviewed: 12/13/2019 Elsevier Patient Education  [place]. Hypertension, Adult Hypertension is another name for high blood pressure. High blood pressure forces your heart to work harder to pump blood. This can cause problems overtime. There are two numbers in a blood pressure reading. There is a top number (systolic) over a bottom number (diastolic). It is best to have a blood pressure that is below 120/80. Healthy choicescan help lower your blood pressure, or you may need medicine to help lower it. What are the causes? The cause of this condition is not known. Some conditions may be related tohigh blood pressure. What increases the risk? Smoking. Having type 2 diabetes mellitus, high cholesterol, or both. Not getting enough exercise or physical activity. Being overweight. Having too much fat, sugar, calories, or salt (sodium) in your diet. Drinking too much alcohol. Having long-term (chronic) kidney disease. Having a family history of high blood pressure. Age. Risk increases with age. Race. You may be at higher risk if you are African American. Gender. Men are at higher risk than women before age 45. After age 67, women are at higher risk than men. Having obstructive sleep apnea. Stress. What are the signs or symptoms? High blood pressure may not cause symptoms. Very high blood pressure (hypertensive crisis) may cause: Headache. Feelings of worry or nervousness (anxiety). Shortness of breath. Nosebleed. A feeling of  being sick to your stomach (nausea). Throwing  up (vomiting). Changes in how you see. Very bad chest pain. Seizures. How is this treated? This condition is treated by making healthy lifestyle changes, such as: Eating healthy foods. Exercising more. Drinking less alcohol. Your health care provider may prescribe medicine if lifestyle changes are not enough to get your blood pressure under control, and if: Your top number is above 130. Your bottom number is above 80. Your personal target blood pressure may vary. Follow these instructions at home: Eating and drinking  If told, follow the DASH eating plan. To follow this plan: Fill one half of your plate at each meal with fruits and vegetables. Fill one fourth of your plate at each meal with whole grains. Whole grains include whole-wheat pasta, brown rice, and whole-grain bread. Eat or drink low-fat dairy products, such as skim milk or low-fat yogurt. Fill one fourth of your plate at each meal with low-fat (lean) proteins. Low-fat proteins include fish, chicken without skin, eggs, beans, and tofu. Avoid fatty meat, cured and processed meat, or chicken with skin. Avoid pre-made or processed food. Eat less than 1,500 mg of salt each day. Do not drink alcohol if: Your doctor tells you not to drink. You are pregnant, may be pregnant, or are planning to become pregnant. If you drink alcohol: Limit how much you use to: 0-1 drink a day for women. 0-2 drinks a day for men. Be aware of how much alcohol is in your drink. In the [place] one drink equals one 12 oz bottle of beer (355 mL), one 5 oz glass of wine (148 mL), or one 1 oz glass of hard liquor (44 mL).  Lifestyle  Work with your doctor to stay at a healthy weight or to lose weight. Ask your doctor what the best weight is for you. Get at least 30 minutes of exercise most days of the week. This may include walking, swimming, or biking. Get at least 30 minutes of exercise that strengthens  your muscles (resistance exercise) at least 3 days a week. This may include lifting weights or doing Pilates. Do not use any products that contain nicotine or tobacco, such as cigarettes, e-cigarettes, and chewing tobacco. If you need help quitting, ask your doctor. Check your blood pressure at home as told by your doctor. Keep all follow-up visits as told by your doctor. This is important.  Medicines Take over-the-counter and prescription medicines only as told by your doctor. Follow directions carefully. Do not skip doses of blood pressure medicine. The medicine does not work as well if you skip doses. Skipping doses also puts you at risk for problems. Ask your doctor about side effects or reactions to medicines that you should watch for. Contact a doctor if you: Think you are having a reaction to the medicine you are taking. Have headaches that keep coming back (recurring). Feel dizzy. Have swelling in your ankles. Have trouble with your vision. Get help right away if you: Get a very bad headache. Start to feel mixed up (confused). Feel weak or numb. Feel faint. Have very bad pain in your: Chest. Belly (abdomen). Throw up more than once. Have trouble breathing. Summary Hypertension is another name for high blood pressure. High blood pressure forces your heart to work harder to pump blood. For most people, a normal blood pressure is less than 120/80. Making healthy choices can help lower blood pressure. If your blood pressure does not get lower with healthy choices, you may need to take medicine. This information is not intended  to replace advice given to you by your health care provider. Make sure you discuss any questions you have with your healthcare provider. Document Revised: 05/24/2018 Document Reviewed: 05/24/2018 Elsevier Patient Education  2022 [employer].   Consent to CCM Services: Cameron [name] was given information about Chronic Care Management services today  including:  CCM service includes personalized support from designated clinical staff supervised by his physician, including individualized plan of care and coordination with other care providers 24/7 contact phone numbers for assistance for urgent and routine care needs. Service will only be billed when office clinical staff spend 20 minutes or more in a month to coordinate care. Only one practitioner may furnish and bill the service in a calendar month. The patient may stop CCM services at any time (effective at the end of the month) by phone call to the office staff. The patient will be responsible for cost sharing (co-pay) of up to 20% of the service fee (after annual deductible is met).  Patient agreed to services and verbal consent obtained.   Patient verbalizes understanding of instructions provided today and agrees to view in [place].   Telephone follow up appointment with care management team member scheduled for:  Peter Garter RN, [facility], CDE Care Management Coordinator [facility] [phone], Mobile [phone]   CLINICAL CARE PLAN: Patient Care Plan: Cardiovascular disease Management (HTN and HLD)     Problem Identified: Lack of long term self managment of Cardiovascular disease Management (HTN and HLD)   Priority: Medium     Long-Range Goal: Effective Self Management of Cardiovascular disease Management (HTN and HLD)   Start Date: 04/15/2021  Expected End Date: 08/27/2021  This Visit's Progress: On track  Priority: Medium  Note:   Current Barriers:  Knowledge Deficits related to basic understanding of hypertension and hyperlipidemia pathophysiology and self care management Unable to independently self manage Cardiovascular disease (HTN and HLD) States he has not been checking his B/P at home and he does have a B/P monitor.  States he usually does not add salt to his food.  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 Hospital Admissions in last 6 months = 0 patient will attend all scheduled medical appointments: no upcoming primary scheduled patient will demonstrate improved adherence to prescribed treatment plan for hypertension as evidenced by taking all medications as prescribed, monitoring and recording blood pressure as directed, adhering to low sodium/DASH diet patient will demonstrate improved health management independence as evidenced by checking blood pressure as directed and notifying PCP if SBP>160 or DBP > 90, taking all medications as prescribe, and adhering to a low sodium diet as discussed. patient will verbalize basic understanding of Cardiovascular disease (HTN and HLD) process and self health management plan as evidenced by adherence to medications, self monitoring and diet adherence Interventions:  Collaboration with Maximiano Coss, NP regarding development and update of comprehensive plan of care as evidenced by provider attestation and co-signature Inter-disciplinary care team collaboration (see longitudinal plan of care) Evaluation of current treatment plan related to hypertension self management and patient's adherence to plan as established by provider. Provided education to patient re: stroke prevention, s/s of heart attack and stroke, DASH diet, complications of uncontrolled blood pressure Reviewed medications with patient and discussed importance of compliance Discussed plans with patient for ongoing care management follow up and  provided patient with direct contact information for care management team Advised patient, providing education and rationale, to monitor blood pressure daily and record, calling PCP for findings outside established parameters.  Reviewed scheduled/upcoming provider appointments  including: no upcoming primary care visits Reviewed to avoid saturated fats, trans-fats and eat more fiber Reviewed to take statin as ordered Reviewed to lower fatty foods, red meat, cheese, milk and increase fiber like whole grains and veggies. Reviewed benefits of cutting back or quitting smoking Self-Care Activities:  Patient verbalizes understanding of plan to self manage Cardiovascular disease (HTN and HLD) Self administers medications as prescribed Attends all scheduled provider appointments Calls pharmacy for medication refills Calls provider office for new concerns or questions Patient Goals  - Self administer medications as prescribed  - Attend all scheduled provider appointments  - Call provider office for new concerns, questions, or BP outside discussed parameters  - Check BP and record as discussed  - Follows a low sodium diet/DASH diet - check blood pressure weekly - choose a place to take my blood pressure (home, clinic or office, retail store) - write blood pressure results in a log or diary - ask questions to understand - change to whole grain breads, cereal, pasta - eat smaller or less servings of red meat - fill half the plate with nonstarchy vegetables - get blood test (fasting) done 1 week before next visit - increase the amount of fiber in food - read food labels for fat and fiber Follow Up Plan: Telephone follow up appointment with care management team member scheduled for: 06/03/21 at 1 PM The patient has been provided with contact information for the care management team and has been advised to call with any health related questions or concerns.      Patient Care Plan: Pre-diabetes     Problem Identified: Knowledge deficit related to Pre-diabetes   Priority: High     Long-Range Goal: Self Management of Pre-daibetes   Start Date: 04/15/2021  Expected End Date: 08/27/2021  This Visit's Progress: On track  Priority: High  Note:   Current Barriers:   Ineffective Self Health Maintenance in a patient with  prediabetes Unable to independently self manage Pre-diabetes States that he has been working on losing weight over the last 2 yrs and his weight has gone from 260 to 207 this morning.  States he has cut back on bread and is watching his portion sizes. States he is active when he works at his part-time job at [employer] but does not exercise regularly.   Clinical Goal(s):  Collaboration with Maximiano Coss, NP regarding development and update of comprehensive plan of care as evidenced by provider attestation and co-signature Inter-disciplinary care team collaboration (see longitudinal plan of care) patient will work with care management team to address care coordination and chronic disease management needs related to Disease Management Educational Needs   Interventions:  Evaluation of current treatment plan related to  prediabetes  , Limited education about prediabetes* self-management and patient's adherence to plan as established by provider. Collaboration with Maximiano Coss, NP regarding development and update of comprehensive plan of care as evidenced by provider attestation       and co-signature Inter-disciplinary care team collaboration (see longitudinal plan of care) Discussed plans with patient for ongoing care management follow up and provided patient with direct contact information for care management team Reviewed importance of keeping Hemoglobin A1C below 6.5% and to avoid becoming diabetic Reviewed pathophysiology of prediabetes and  insulin resistance  Reviewed the effect that even a 5-10% weight loss can have on his blood sugars Reviewed importance of regular exercise and its effect on blood sugar and HTN Reviewed recommendations to exercise 150 minutes a week including 2 sessions of resistance training Self Care Activities:  Patient verbalizes understanding of plan to self manage prediabetes Attends all scheduled  provider appointments Calls provider office for new concerns or questions Patient Goals: - change to whole grain breads, cereal, pasta - drink 6 to 8 glasses of water each day - fill half of plate with vegetables - limit fast food meals to no more than 1 per week - manage portion size - prepare main meal at home 3 to 5 days each week - read food labels for fat, fiber, carbohydrates and portion size - reduce red meat to 2 to 3 times a week - switch to low-fat or skim milk - switch to sugar-free drinks -Plan to work up to  exercise 150 minutes a week including two sessions of resistance exercise weekly Follow Up Plan: Telephone follow up appointment with care management team member scheduled for: 06/03/21 at 1 PM The patient has been provided with contact information for the care management team and has been advised to call with any health related questions or concerns.

## 2021-04-15 NOTE — Chronic Care Management (AMB) (Signed)
Chronic Care Management   CCM RN Visit Note  04/15/2021 Name: Cameron [name] MRN: [id number] DOB: 26-Nov-1950  Subjective: Cameron [name] is a 70 y.o. year old male who is a primary care patient of Maximiano Coss, NP. The care management team was consulted for assistance with disease management and care coordination needs.    Engaged with patient by telephone for initial visit in response to provider referral for case management and/or care coordination services.   Consent to Services:  The patient was given the following information about Chronic Care Management services today, agreed to services, and gave verbal consent: 1. CCM service includes personalized support from designated clinical staff supervised by the primary care provider, including individualized plan of care and coordination with other care providers 2. 24/7 contact phone numbers for assistance for urgent and routine care needs. 3. Service will only be billed when office clinical staff spend 20 minutes or more in a month to coordinate care. 4. Only one practitioner may furnish and bill the service in a calendar month. 5.The patient may stop CCM services at any time (effective at the end of the month) by phone call to the office staff. 6. The patient will be responsible for cost sharing (co-pay) of up to 20% of the service fee (after annual deductible is met). Patient agreed to services and consent obtained.  Patient agreed to services and verbal consent obtained.   Assessment: Review of patient past medical history, allergies, medications, health status, including review of consultants reports, laboratory and other test data, was performed as part of comprehensive evaluation and provision of chronic care management services.   SDOH (Social Determinants of Health) assessments and interventions performed:  SDOH Interventions    Flowsheet Row Most Recent Value  SDOH Interventions   Food Insecurity Interventions Intervention Not  Indicated  Financial Strain Interventions Intervention Not Indicated  Housing Interventions Intervention Not Indicated  Physical Activity Interventions Other (Comments)  [discussed insurance fitness benefit]  Stress Interventions Intervention Not Indicated  Transportation Interventions Intervention Not Indicated        CCM Care Plan  No Known Allergies  Outpatient Encounter Medications as of 04/15/2021  Medication Sig   aspirin 81 MG tablet Take 81 mg by mouth daily.   atorvastatin (LIPITOR) 20 MG tablet TAKE 1 TABLET DAILY   benazepril (LOTENSIN) 40 MG tablet TAKE 1 TABLET DAILY   benzonatate (TESSALON PERLES) 100 MG capsule Take 1 capsule (100 mg total) by mouth 3 (three) times daily as needed.   hydrochlorothiazide (MICROZIDE) 12.5 MG capsule TAKE 1 CAPSULE DAILY   No facility-administered encounter medications on file as of 04/15/2021.    Patient Active Problem List   Diagnosis Date Noted   Abnormal hemoglobin (Hgb) ([place]) 04/08/2020   Need for hepatitis C screening test 04/26/2018   Centrilobular emphysema ([place]) 04/20/2018   Polycythemia 04/20/2018   CAD (coronary artery disease) 03/31/2018   Visit for preventive health examination 02/27/2018   Overweight (BMI 25.0-29.9) 04/23/2014   Tobacco dependence 12/29/2012   Health maintenance examination 03/13/2012   Hyperlipemia, mixed 03/13/2012   HTN (hypertension), benign 02/11/2012    Conditions to be addressed/monitored:HTN, HLD, and prediabetes  Care Plan : Cardiovascular disease Management (HTN and HLD)  Updates made by Dimitri Ped, RN since 04/15/2021 12:00 AM     Problem: Lack of long term self managment of Cardiovascular disease Management (HTN and HLD)   Priority: Medium     Long-Range Goal: Effective Self Management of Cardiovascular disease Management (HTN  and HLD)   Start Date: 04/15/2021  Expected End Date: 08/27/2021  This Visit's Progress: On track  Priority: Medium  Note:   Current Barriers:   Knowledge Deficits related to basic understanding of hypertension and hyperlipidemia pathophysiology and self care management Unable to independently self manage Cardiovascular disease (HTN and HLD) States he has not been checking his B/P at home and he does have a B/P monitor.  States he usually does not add salt to his food.  States is not interested in quitting smoking at this time Nurse Case Manager Clinical Goal(s):  patient will verbalize understanding of plan for Cardiovascular disease self management (HTN and HLD) patient will not experience hospital admission. Hospital Admissions in last 6 months = 0 patient will attend all scheduled medical appointments: no upcoming primary scheduled patient will demonstrate improved adherence to prescribed treatment plan for hypertension as evidenced by taking all medications as prescribed, monitoring and recording blood pressure as directed, adhering to low sodium/DASH diet patient will demonstrate improved health management independence as evidenced by checking blood pressure as directed and notifying PCP if SBP>160 or DBP > 90, taking all medications as prescribe, and adhering to a low sodium diet as discussed. patient will verbalize basic understanding of Cardiovascular disease (HTN and HLD) process and self health management plan as evidenced by adherence to medications, self monitoring and diet adherence Interventions:  Collaboration with Maximiano Coss, NP regarding development and update of comprehensive plan of care as evidenced by provider attestation and co-signature Inter-disciplinary care team collaboration (see longitudinal plan of care) Evaluation of current treatment plan related to hypertension self management and patient's adherence to plan as established by provider. Provided education to patient re: stroke prevention, s/s of heart attack and stroke, DASH diet, complications of uncontrolled blood pressure Reviewed medications with  patient and discussed importance of compliance Discussed plans with patient for ongoing care management follow up and provided patient with direct contact information for care management team Advised patient, providing education and rationale, to monitor blood pressure daily and record, calling PCP for findings outside established parameters.  Reviewed scheduled/upcoming provider appointments including: no upcoming primary care visits Reviewed to avoid saturated fats, trans-fats and eat more fiber Reviewed to take statin as ordered Reviewed to lower fatty foods, red meat, cheese, milk and increase fiber like whole grains and veggies. Reviewed benefits of cutting back or quitting smoking Self-Care Activities:  Patient verbalizes understanding of plan to self manage Cardiovascular disease (HTN and HLD) Self administers medications as prescribed Attends all scheduled provider appointments Calls pharmacy for medication refills Calls provider office for new concerns or questions Patient Goals  - Self administer medications as prescribed  - Attend all scheduled provider appointments  - Call provider office for new concerns, questions, or BP outside discussed parameters  - Check BP and record as discussed  - Follows a low sodium diet/DASH diet - check blood pressure weekly - choose a place to take my blood pressure (home, clinic or office, retail store) - write blood pressure results in a log or diary - ask questions to understand - change to whole grain breads, cereal, pasta - eat smaller or less servings of red meat - fill half the plate with nonstarchy vegetables - get blood test (fasting) done 1 week before next visit - increase the amount of fiber in food - read food labels for fat and fiber Follow Up Plan: Telephone follow up appointment with care management team member scheduled for: 06/03/21 at 1 PM The  patient has been provided with contact information for the care management team and  has been advised to call with any health related questions or concerns.      Care Plan : Pre-diabetes  Updates made by Dimitri Ped, RN since 04/15/2021 12:00 AM     Problem: Knowledge deficit related to Pre-diabetes   Priority: High     Long-Range Goal: Self Management of Pre-daibetes   Start Date: 04/15/2021  Expected End Date: 08/27/2021  This Visit's Progress: On track  Priority: High  Note:   Current Barriers:  Ineffective Self Health Maintenance in a patient with  prediabetes Unable to independently self manage Pre-diabetes States that he has been working on losing weight over the last 2 yrs and his weight has gone from 260 to 207 this morning.  States he has cut back on bread and is watching his portion sizes. States he is active when he works at his part-time job at [employer] but does not exercise regularly.   Clinical Goal(s):  Collaboration with Maximiano Coss, NP regarding development and update of comprehensive plan of care as evidenced by provider attestation and co-signature Inter-disciplinary care team collaboration (see longitudinal plan of care) patient will work with care management team to address care coordination and chronic disease management needs related to Disease Management Educational Needs   Interventions:  Evaluation of current treatment plan related to  prediabetes  , Limited education about prediabetes* self-management and patient's adherence to plan as established by provider. Collaboration with Maximiano Coss, NP regarding development and update of comprehensive plan of care as evidenced by provider attestation       and co-signature Inter-disciplinary care team collaboration (see longitudinal plan of care) Discussed plans with patient for ongoing care management follow up and provided patient with direct contact information for care management team Reviewed importance of keeping Hemoglobin A1C below 6.5% and to avoid becoming  diabetic Reviewed pathophysiology of prediabetes and  insulin resistance   Reviewed the effect that even a 5-10% weight loss can have on his blood sugars Reviewed importance of regular exercise and its effect on blood sugar and HTN Reviewed recommendations to exercise 150 minutes a week including 2 sessions of resistance training Self Care Activities:  Patient verbalizes understanding of plan to self manage prediabetes Attends all scheduled provider appointments Calls provider office for new concerns or questions Patient Goals: - change to whole grain breads, cereal, pasta - drink 6 to 8 glasses of water each day - fill half of plate with vegetables - limit fast food meals to no more than 1 per week - manage portion size - prepare main meal at home 3 to 5 days each week - read food labels for fat, fiber, carbohydrates and portion size - reduce red meat to 2 to 3 times a week - switch to low-fat or skim milk - switch to sugar-free drinks -Plan to work up to  exercise 150 minutes a week including two sessions of resistance exercise weekly Follow Up Plan: Telephone follow up appointment with care management team member scheduled for: 06/03/21 at 1 PM The patient has been provided with contact information for the care management team and has been advised to call with any health related questions or concerns.       Plan:Telephone follow up appointment with care management team member scheduled for:  06/03/21 and The patient has been provided with contact information for the care management team and has been advised to call with any health related  questions or concerns.  Peter Garter RN, BSN,CCM, CDE Care Management Coordinator [place] [phone], Mobile [phone]

## 2021-05-19 ENCOUNTER — Telehealth: Payer: Self-pay | Admitting: Registered Nurse

## 2021-05-19 NOTE — Telephone Encounter (Signed)
Left message for patient to call back and schedule Medicare Annual Wellness Visit (AWV).   Please offer to do virtually or by telephone.  Left office number and my jabber [phone].  Last AWV:02/06/2020  Please schedule at anytime with Nurse Health Advisor.

## 2021-06-02 ENCOUNTER — Other Ambulatory Visit: Payer: Self-pay | Admitting: Family

## 2021-06-02 DIAGNOSIS — I10 Essential (primary) hypertension: Secondary | ICD-10-CM

## 2021-06-02 DIAGNOSIS — E782 Mixed hyperlipidemia: Secondary | ICD-10-CM

## 2021-06-03 ENCOUNTER — Ambulatory Visit (INDEPENDENT_AMBULATORY_CARE_PROVIDER_SITE_OTHER): Payer: Medicare HMO

## 2021-06-03 DIAGNOSIS — E782 Mixed hyperlipidemia: Secondary | ICD-10-CM

## 2021-06-03 DIAGNOSIS — R7309 Other abnormal glucose: Secondary | ICD-10-CM

## 2021-06-03 DIAGNOSIS — I10 Essential (primary) hypertension: Secondary | ICD-10-CM

## 2021-06-03 DIAGNOSIS — F17200 Nicotine dependence, unspecified, uncomplicated: Secondary | ICD-10-CM

## 2021-06-03 NOTE — Patient Instructions (Signed)
Visit Information  PATIENT GOALS:  Goals Addressed             This Visit's Progress    RNCM:Eat Healthy   On track    Timeframe:  Long-Range Goal Priority:  High Start Date:   04/15/21                          Expected End Date:    09/26/21                   Follow Up Date 09/02/21    - change to whole grain breads, cereal, pasta - drink 6 to 8 glasses of water each day - fill half of plate with vegetables - limit fast food meals to no more than 1 per week - manage portion size - prepare main meal at home 3 to 5 days each week - read food labels for fat, fiber, carbohydrates and portion size - reduce red meat to 2 to 3 times a week - switch to low-fat or skim milk - switch to sugar-free drinks    Why is this important?   When you are ready to manage your nutrition or weight, having a plan and setting goals will help.  Taking small steps to change how you eat and exercise is a good place to start.    Notes:      RNCM:Manage My Cholesterol   On track    Timeframe:  Long-Range Goal Priority:  Medium Start Date:       04/15/21                      Expected End Date:   09/26/21                    Follow Up Date 09/02/21    - change to whole grain breads, cereal, pasta - eat smaller or less servings of red meat - fill half the plate with nonstarchy vegetables - get blood test (fasting) done 1 week before next visit - increase the amount of fiber in food - read food labels for fat and fiber  -  work up to  exercise 150 minutes a week including two sessions of resistance exercise weekly   Why is this important?   Changing cholesterol starts with eating heart-healthy foods.  Other steps may be to increase your activity and to quit if you smoke.    Notes:      RNCM:Track and Manage My Blood Pressure-Hypertension   On track    Timeframe:  Long-Range Goal Priority:  Medium Start Date:        04/15/21                     Expected End Date:     09/26/21                   Follow Up Date 09/02/21    - check blood pressure weekly - choose a place to take my blood pressure (home, clinic or office, retail store) - write blood pressure results in a log or diary  -replace batteries in B/P monitor   Why is this important?   You won't feel high blood pressure, but it can still hurt your blood vessels.  High blood pressure can cause heart or kidney problems. It can also cause a stroke.  Making lifestyle changes like losing a little weight or  eating less salt will help.  Checking your blood pressure at home and at different times of the day can help to control blood pressure.  If the doctor prescribes medicine remember to take it the way the doctor ordered.  Call the office if you cannot afford the medicine or if there are questions about it.     Notes:         Patient verbalizes understanding of instructions provided today and agrees to view in [place].   Telephone follow up appointment with care management team member scheduled for: 09/02/21 at 1 PM  [place], [facility], CDE Care Management Coordinator [facility] [phone], Mobile [phone]

## 2021-06-03 NOTE — Chronic Care Management (AMB) (Signed)
Chronic Care Management   CCM RN Visit Note  06/03/2021 Name: Cameron [name] MRN: [id number] DOB: August 02, 1951  Subjective: Cameron [name] is a 70 y.o. year old male who is a primary care patient of Maximiano Coss, NP. The care management team was consulted for assistance with disease management and care coordination needs.    Engaged with patient by telephone for follow up visit in response to provider referral for case management and/or care coordination services.   Consent to Services:  The patient was given information about Chronic Care Management services, agreed to services, and gave verbal consent prior to initiation of services.  Please see initial visit note for detailed documentation.   Patient agreed to services and verbal consent obtained.   Assessment: Review of patient past medical history, allergies, medications, health status, including review of consultants reports, laboratory and other test data, was performed as part of comprehensive evaluation and provision of chronic care management services.   SDOH (Social Determinants of Health) assessments and interventions performed:    CCM Care Plan  No Known Allergies  Outpatient Encounter Medications as of 06/03/2021  Medication Sig   aspirin 81 MG tablet Take 81 mg by mouth daily.   atorvastatin (LIPITOR) 20 MG tablet TAKE 1 TABLET DAILY   benazepril (LOTENSIN) 40 MG tablet TAKE 1 TABLET DAILY   benzonatate (TESSALON PERLES) 100 MG capsule Take 1 capsule (100 mg total) by mouth 3 (three) times daily as needed.   hydrochlorothiazide (MICROZIDE) 12.5 MG capsule TAKE 1 CAPSULE DAILY   No facility-administered encounter medications on file as of 06/03/2021.    Patient Active Problem List   Diagnosis Date Noted   Abnormal hemoglobin (Hgb) ([place]) 04/08/2020   Need for hepatitis C screening test 04/26/2018   Centrilobular emphysema ([place]) 04/20/2018   Polycythemia 04/20/2018   CAD (coronary artery disease) 03/31/2018   Visit for  preventive health examination 02/27/2018   Overweight (BMI 25.0-29.9) 04/23/2014   Tobacco dependence 12/29/2012   Health maintenance examination 03/13/2012   Hyperlipemia, mixed 03/13/2012   HTN (hypertension), benign 02/11/2012    Conditions to be addressed/monitored:CAD, HTN, HLD, and tobacco dependence  Care Plan : Cardiovascular disease Management (HTN and HLD)  Updates made by Dimitri Ped, RN since 06/03/2021 12:00 AM     Problem: Lack of long term self managment of Cardiovascular disease Management (HTN and HLD)   Priority: Medium     Long-Range Goal: Effective Self Management of Cardiovascular disease Management (HTN and HLD)   Start Date: 04/15/2021  Expected End Date: 09/26/2021  This Visit's Progress: On track  Recent Progress: On track  Priority: Medium  Note:   Current Barriers:  Knowledge Deficits related to basic understanding of hypertension and hyperlipidemia pathophysiology and self care management Unable to independently self manage Cardiovascular disease (HTN and HLD) States he has not been checking his B/P as he needs to replace the batteries in his monitor.  States he usually does not add salt to his food and uses a lot of pepper.  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 States he has not cut back on smoking and he is not interested in quitting smoking at this time Nurse Case Manager Clinical Goal(s):  patient will verbalize understanding of plan for Cardiovascular disease self management (HTN and HLD) patient will not experience hospital admission. Hospital Admissions in last 6 months = 0 patient will attend all scheduled medical appointments: no upcoming primary scheduled patient will demonstrate improved  adherence to prescribed treatment plan for hypertension as evidenced by taking all medications as prescribed, monitoring and recording blood pressure as directed, adhering to low sodium/DASH diet patient will demonstrate  improved health management independence as evidenced by checking blood pressure as directed and notifying PCP if SBP>160 or DBP > 90, taking all medications as prescribe, and adhering to a low sodium diet as discussed. patient will verbalize basic understanding of Cardiovascular disease (HTN and HLD) process and self health management plan as evidenced by adherence to medications, self monitoring and diet adherence Interventions:  Collaboration with Maximiano Coss, NP regarding development and update of comprehensive plan of care as evidenced by provider attestation and co-signature Inter-disciplinary care team collaboration (see longitudinal plan of care) Evaluation of current treatment plan related to hypertension self management and patient's adherence to plan as established by provider. Reinforced education to patient re: stroke prevention, s/s of heart attack and stroke, DASH diet, complications of uncontrolled blood pressure Reviewed medications with patient and discussed importance of compliance Discussed plans with patient for ongoing care management follow up and provided patient with direct contact information for care management team Reinforced to monitor blood pressure daily and record, calling PCP for findings outside established parameters.  Reviewed scheduled/upcoming provider appointments including: no upcoming primary care visits Reinforced to avoid saturated fats, trans-fats and eat more fiber Reinforced to take statin as ordered Reinforced to lower fatty foods, red meat, cheese, milk and increase fiber like whole grains and veggies. Reinforced benefits of cutting back or quitting smoking Encouraged to get new batteries for his B/P monitor Self-Care Activities:  Patient verbalizes understanding of plan to self manage Cardiovascular disease (HTN and HLD) Self administers medications as prescribed Attends all scheduled provider appointments Calls pharmacy for medication  refills Calls provider office for new concerns or questions Patient Goals  - Self administer medications as prescribed  - Attend all scheduled provider appointments  - Call provider office for new concerns, questions, or BP outside discussed parameters  - Check BP and record as discussed  - Follows a low sodium diet/DASH diet - check blood pressure weekly - choose a place to take my blood pressure (home, clinic or office, retail store) - write blood pressure results in a log or diary - ask questions to understand - change to whole grain breads, cereal, pasta - eat smaller or less servings of red meat - fill half the plate with nonstarchy vegetables - get blood test (fasting) done 1 week before next visit - increase the amount of fiber in food - read food labels for fat and fiber -replace batteries in B/P monitor Follow Up Plan: Telephone follow up appointment with care management team member scheduled for:09/02/21 at 1 PM The patient has been provided with contact information for the care management team and has been advised to call with any health related questions or concerns.      Care Plan : Pre-diabetes  Updates made by Dimitri Ped, RN since 06/03/2021 12:00 AM     Problem: Knowledge deficit related to Pre-diabetes   Priority: High     Long-Range Goal: Self Management of Pre-daibetes   Start Date: 04/15/2021  Expected End Date: 09/26/2021  This Visit's Progress: On track  Recent Progress: On track  Priority: High  Note:   Current Barriers:  Ineffective Self Health Maintenance in a patient with  prediabetes Unable to independently self manage Pre-diabetes States his weight has varied from 200-205.  States his weight went up some while he  was at the beach last month but it has gone back down  States he has cut back on bread and is watching his portion sizes. States he is active when he works at his part-time job at [employer].  States he does walk in his neighborhood  when he is not working.   Clinical Goal(s):  Collaboration with Maximiano Coss, NP regarding development and update of comprehensive plan of care as evidenced by provider attestation and co-signature Inter-disciplinary care team collaboration (see longitudinal plan of care) patient will work with care management team to address care coordination and chronic disease management needs related to Disease Management Educational Needs   Interventions:  Evaluation of current treatment plan related to  prediabetes  , Limited education about prediabetes* self-management and patient's adherence to plan as established by provider. Collaboration with Maximiano Coss, NP regarding development and update of comprehensive plan of care as evidenced by provider attestation       and co-signature Inter-disciplinary care team collaboration (see longitudinal plan of care) Discussed plans with patient for ongoing care management follow up and provided patient with direct contact information for care management team Reinforced importance of keeping Hemoglobin A1C below 6.5% and to avoid becoming diabetic Reviewed pathophysiology of prediabetes and  insulin resistance   Reinforced the effect that even a 5-10% weight loss can have on his blood sugars Reinforced importance of regular exercise and its effect on blood sugar and HTN Reinforced  recommendations to exercise 150 minutes a week including 2 sessions of resistance training Self Care Activities:  Patient verbalizes understanding of plan to self manage prediabetes Attends all scheduled provider appointments Calls provider office for new concerns or questions Patient Goals: - change to whole grain breads, cereal, pasta - drink 6 to 8 glasses of water each day - fill half of plate with vegetables - limit fast food meals to no more than 1 per week - manage portion size - prepare main meal at home 3 to 5 days each week - read food labels for fat, fiber,  carbohydrates and portion size - reduce red meat to 2 to 3 times a week - switch to low-fat or skim milk - switch to sugar-free drinks -Plan to work up to  exercise 150 minutes a week including two sessions of resistance exercise weekly Follow Up Plan: Telephone follow up appointment with care management team member scheduled for: 09/02/21 at 1 PM The patient has been provided with contact information for the care management team and has been advised to call with any health related questions or concerns.       Plan:Telephone follow up appointment with care management team member scheduled for:  09/03/87 and The patient has been provided with contact information for the care management team and has been advised to call with any health related questions or concerns.  Peter Garter RN, BSN,CCM, CDE Care Management Coordinator [place] [phone], Mobile [phone]

## 2021-06-04 ENCOUNTER — Other Ambulatory Visit: Payer: Self-pay

## 2021-06-04 DIAGNOSIS — I10 Essential (primary) hypertension: Secondary | ICD-10-CM

## 2021-06-04 DIAGNOSIS — E782 Mixed hyperlipidemia: Secondary | ICD-10-CM

## 2021-06-04 MED ORDER — ATORVASTATIN CALCIUM 20 MG PO TABS: 20.0000 mg | ORAL_TABLET | Freq: Every day | ORAL | 0 refills | Status: DC

## 2021-06-04 MED ORDER — HYDROCHLOROTHIAZIDE 12.5 MG PO CAPS: 12.5000 mg | ORAL_CAPSULE | Freq: Every day | ORAL | 0 refills | Status: DC

## 2021-06-04 MED ORDER — BENAZEPRIL HCL 40 MG PO TABS: 40.0000 mg | ORAL_TABLET | Freq: Every day | ORAL | 0 refills | Status: DC

## 2021-06-26 DIAGNOSIS — I10 Essential (primary) hypertension: Secondary | ICD-10-CM | POA: Diagnosis not present

## 2021-06-26 DIAGNOSIS — E782 Mixed hyperlipidemia: Secondary | ICD-10-CM

## 2021-07-08 NOTE — Telephone Encounter (Signed)
Rx filled 06/04/2021 for 90 days

## 2021-07-10 ENCOUNTER — Encounter: Payer: Self-pay | Admitting: Registered Nurse

## 2021-07-10 ENCOUNTER — Other Ambulatory Visit: Payer: Self-pay

## 2021-07-10 ENCOUNTER — Ambulatory Visit (INDEPENDENT_AMBULATORY_CARE_PROVIDER_SITE_OTHER): Payer: Medicare HMO | Admitting: Registered Nurse

## 2021-07-10 DIAGNOSIS — Z23 Encounter for immunization: Secondary | ICD-10-CM

## 2021-07-10 NOTE — Progress Notes (Signed)
Flu shot given  Kathrin Ruddy, NP

## 2021-09-02 ENCOUNTER — Other Ambulatory Visit: Payer: Self-pay

## 2021-09-02 ENCOUNTER — Ambulatory Visit: Payer: Medicare HMO

## 2021-09-02 ENCOUNTER — Telehealth: Payer: Self-pay

## 2021-09-02 DIAGNOSIS — E782 Mixed hyperlipidemia: Secondary | ICD-10-CM

## 2021-09-02 DIAGNOSIS — I10 Essential (primary) hypertension: Secondary | ICD-10-CM

## 2021-09-02 DIAGNOSIS — I2510 Atherosclerotic heart disease of native coronary artery without angina pectoris: Secondary | ICD-10-CM

## 2021-09-02 DIAGNOSIS — F17200 Nicotine dependence, unspecified, uncomplicated: Secondary | ICD-10-CM

## 2021-09-02 MED ORDER — HYDROCHLOROTHIAZIDE 12.5 MG PO CAPS: 12.5000 mg | ORAL_CAPSULE | Freq: Every day | ORAL | 0 refills | Status: DC

## 2021-09-02 MED ORDER — BENAZEPRIL HCL 40 MG PO TABS: 40.0000 mg | ORAL_TABLET | Freq: Every day | ORAL | 0 refills | Status: DC

## 2021-09-02 MED ORDER — ATORVASTATIN CALCIUM 20 MG PO TABS: 20.0000 mg | ORAL_TABLET | Freq: Every day | ORAL | 0 refills | Status: DC

## 2021-09-02 NOTE — Chronic Care Management (AMB) (Signed)
Chronic Care Management   CCM RN Visit Note  09/02/2021 Name: Cameron [name] MRN: [id number] DOB: [birth year]  Subjective: Cameron [name] is a 70 y.o. year old male who is a primary care patient of Maximiano Coss, NP. The care management team was consulted for assistance with disease management and care coordination needs.    Engaged with patient by telephone for follow up visit in response to provider referral for case management and/or care coordination services.   Consent to Services:  The patient was given information about Chronic Care Management services, agreed to services, and gave verbal consent prior to initiation of services.  Please see initial visit note for detailed documentation.   Patient agreed to services and verbal consent obtained.   Assessment: Review of patient past medical history, allergies, medications, health status, including review of consultants reports, laboratory and other test data, was performed as part of comprehensive evaluation and provision of chronic care management services.   SDOH (Social Determinants of Health) assessments and interventions performed:    CCM Care Plan  No Known Allergies  Outpatient Encounter Medications as of 09/02/2021  Medication Sig   aspirin 81 MG tablet Take 81 mg by mouth daily.   atorvastatin (LIPITOR) 20 MG tablet Take 1 tablet (20 mg total) by mouth daily.   benazepril (LOTENSIN) 40 MG tablet Take 1 tablet (40 mg total) by mouth daily.   benzonatate (TESSALON PERLES) 100 MG capsule Take 1 capsule (100 mg total) by mouth 3 (three) times daily as needed.   hydrochlorothiazide (MICROZIDE) 12.5 MG capsule Take 1 capsule (12.5 mg total) by mouth daily.   No facility-administered encounter medications on file as of 09/02/2021.    Patient Active Problem List   Diagnosis Date Noted   Abnormal hemoglobin (Hgb) ([place]) 04/08/2020   Need for hepatitis C screening test 04/26/2018   Centrilobular emphysema ([place]) 04/20/2018    Polycythemia 04/20/2018   CAD (coronary artery disease) 03/31/2018   Visit for preventive health examination 02/27/2018   Overweight (BMI 25.0-29.9) 04/23/2014   Tobacco dependence 12/29/2012   Health maintenance examination 03/13/2012   Hyperlipemia, mixed 03/13/2012   HTN (hypertension), benign 02/11/2012    Conditions to be addressed/monitored:CAD, HTN, HLD, and Tobacco Use  Care Plan : Cardiovascular disease Management (HTN and HLD)  Updates made by Dimitri Ped, RN since 09/02/2021 12:00 AM  Completed 09/02/2021   Problem: Lack of long term self managment of Cardiovascular disease Management (HTN and HLD) Resolved 09/02/2021  Priority: Medium     Long-Range Goal: Effective Self Management of Cardiovascular disease Management (HTN and HLD) Completed 09/02/2021  Start Date: 04/15/2021  Expected End Date: 09/26/2021  Recent Progress: On track  Priority: Medium  Note:   Resolving due to duplicate goal  Current Barriers:  Knowledge Deficits related to basic understanding of hypertension and hyperlipidemia pathophysiology and self care management Unable to independently self manage Cardiovascular disease (HTN and HLD) States he has not been checking his B/P as he needs to replace the batteries in his monitor.  States he usually does not add salt to his food and uses a lot of pepper.  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 States he has not cut back on smoking and he is not interested in quitting smoking at this time Nurse Case Manager Clinical Goal(s):  patient will verbalize understanding of plan for Cardiovascular disease self management (HTN and HLD) patient will not experience hospital admission. Hospital Admissions in last 6  months = 0 patient will attend all scheduled medical appointments: no upcoming primary scheduled patient will demonstrate improved adherence to prescribed treatment plan for hypertension as evidenced by taking all medications  as prescribed, monitoring and recording blood pressure as directed, adhering to low sodium/DASH diet patient will demonstrate improved health management independence as evidenced by checking blood pressure as directed and notifying PCP if SBP>160 or DBP > 90, taking all medications as prescribe, and adhering to a low sodium diet as discussed. patient will verbalize basic understanding of Cardiovascular disease (HTN and HLD) process and self health management plan as evidenced by adherence to medications, self monitoring and diet adherence Interventions:  Collaboration with Maximiano Coss, NP regarding development and update of comprehensive plan of care as evidenced by provider attestation and co-signature Inter-disciplinary care team collaboration (see longitudinal plan of care) Evaluation of current treatment plan related to hypertension self management and patient's adherence to plan as established by provider. Reinforced education to patient re: stroke prevention, s/s of heart attack and stroke, DASH diet, complications of uncontrolled blood pressure Reviewed medications with patient and discussed importance of compliance Discussed plans with patient for ongoing care management follow up and provided patient with direct contact information for care management team Reinforced to monitor blood pressure daily and record, calling PCP for findings outside established parameters.  Reviewed scheduled/upcoming provider appointments including: no upcoming primary care visits Reinforced to avoid saturated fats, trans-fats and eat more fiber Reinforced to take statin as ordered Reinforced to lower fatty foods, red meat, cheese, milk and increase fiber like whole grains and veggies. Reinforced benefits of cutting back or quitting smoking Encouraged to get new batteries for his B/P monitor Self-Care Activities:  Patient verbalizes understanding of plan to self manage Cardiovascular disease (HTN and  HLD) Self administers medications as prescribed Attends all scheduled provider appointments Calls pharmacy for medication refills Calls provider office for new concerns or questions Patient Goals  - Self administer medications as prescribed  - Attend all scheduled provider appointments  - Call provider office for new concerns, questions, or BP outside discussed parameters  - Check BP and record as discussed  - Follows a low sodium diet/DASH diet - check blood pressure weekly - choose a place to take my blood pressure (home, clinic or office, retail store) - write blood pressure results in a log or diary - ask questions to understand - change to whole grain breads, cereal, pasta - eat smaller or less servings of red meat - fill half the plate with nonstarchy vegetables - get blood test (fasting) done 1 week before next visit - increase the amount of fiber in food - read food labels for fat and fiber -replace batteries in B/P monitor Follow Up Plan: Telephone follow up appointment with care management team member scheduled for:09/02/21 at 1 PM The patient has been provided with contact information for the care management team and has been advised to call with any health related questions or concerns.      Care Plan : Pre-diabetes  Updates made by Dimitri Ped, RN since 09/02/2021 12:00 AM  Completed 09/02/2021   Problem: Knowledge deficit related to Pre-diabetes Resolved 09/02/2021  Priority: High     Long-Range Goal: Self Management of Pre-daibetes Completed 09/02/2021  Start Date: 04/15/2021  Expected End Date: 09/26/2021  Recent Progress: On track  Priority: High  Note:   Resolving due to duplicate goal  Current Barriers:  Ineffective Self Health Maintenance in a patient with  prediabetes Unable  to independently self manage Pre-diabetes States his weight has varied from 200-205.  States his weight went up some while he was at the beach last month but it has gone back down   States he has cut back on bread and is watching his portion sizes. States he is active when he works at his part-time job at [employer].  States he does walk in his neighborhood when he is not working.   Clinical Goal(s):  Collaboration with Maximiano Coss, NP regarding development and update of comprehensive plan of care as evidenced by provider attestation and co-signature Inter-disciplinary care team collaboration (see longitudinal plan of care) patient will work with care management team to address care coordination and chronic disease management needs related to Disease Management Educational Needs   Interventions:  Evaluation of current treatment plan related to  prediabetes  , Limited education about prediabetes* self-management and patient's adherence to plan as established by provider. Collaboration with Maximiano Coss, NP regarding development and update of comprehensive plan of care as evidenced by provider attestation       and co-signature Inter-disciplinary care team collaboration (see longitudinal plan of care) Discussed plans with patient for ongoing care management follow up and provided patient with direct contact information for care management team Reinforced importance of keeping Hemoglobin A1C below 6.5% and to avoid becoming diabetic Reviewed pathophysiology of prediabetes and  insulin resistance   Reinforced the effect that even a 5-10% weight loss can have on his blood sugars Reinforced importance of regular exercise and its effect on blood sugar and HTN Reinforced  recommendations to exercise 150 minutes a week including 2 sessions of resistance training Self Care Activities:  Patient verbalizes understanding of plan to self manage prediabetes Attends all scheduled provider appointments Calls provider office for new concerns or questions Patient Goals: - change to whole grain breads, cereal, pasta - drink 6 to 8 glasses of water each day - fill half of plate  with vegetables - limit fast food meals to no more than 1 per week - manage portion size - prepare main meal at home 3 to 5 days each week - read food labels for fat, fiber, carbohydrates and portion size - reduce red meat to 2 to 3 times a week - switch to low-fat or skim milk - switch to sugar-free drinks -Plan to work up to  exercise 150 minutes a week including two sessions of resistance exercise weekly Follow Up Plan: Telephone follow up appointment with care management team member scheduled for: 09/02/21 at 1 PM The patient has been provided with contact information for the care management team and has been advised to call with any health related questions or concerns.      Care Plan : RN Care Manager Plan of Care  Updates made by Dimitri Ped, RN since 09/02/2021 12:00 AM     Problem: Chronic Disease Management and Care Coordination Needs (CAD,HTN and HLD)   Priority: High     Long-Range Goal: Establish Plan of Care for Chronic Disease Management Needs (CAD,HTN and HLD)   Start Date: 09/02/2021  Expected End Date: 03/01/2022  Priority: High  Note:   Current Barriers:  Chronic Disease Management support and education needs related to CAD, HTN, HLD, and Tobacco Use  States he has not been checking his B/P recently  States he usually does not add salt to his food and uses a lot of pepper.  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 States he has had a lot of shows recently and he has gotten a lot of activity. States he has not cut back on smoking and he is not interested in quitting smoking at this time.  States his weight was 207 this morning and he is still trying to keep his weight lower.  RNCM Clinical Goal(s):  Patient will verbalize understanding of plan for management of CAD, HTN, HLD, and Tobacco Use as evidenced by voiced adherence to plan of care verbalize basic understanding of  CAD, HTN, HLD, and Tobacco Use disease process and self health  management plan as evidenced by voiced understanding and teach back take all medications exactly as prescribed and will call provider for medication related questions as evidenced by dispense report and pt verbalization demonstrate Improved adherence to prescribed treatment plan for CAD, HTN, HLD, and Tobacco Use as evidenced by readings within limits, voiced adherence to plan of care  through collaboration with RN Care manager, provider, and care team.   Interventions: 1:1 collaboration with primary care provider regarding development and update of comprehensive plan of care as evidenced by provider attestation and co-signature Inter-disciplinary care team collaboration (see longitudinal plan of care) Evaluation of current treatment plan related to  self management and patient's adherence to plan as established by provider   CAD Interventions: (Status:  Goal on track:  Yes.) Long Term Goal Assessed understanding of CAD diagnosis Medications reviewed including medications utilized in CAD treatment plan Provided education on importance of blood pressure control in management of CAD Counseled on importance of regular laboratory monitoring as prescribed Counseled on the importance of exercise goals with target of 150 minutes per week Reviewed Importance of taking all medications as prescribed Reviewed Importance of attending all scheduled provider appointments   Hyperlipidemia Interventions:  (Status:  Goal on track:  Yes.) Long Term Goal Medication review performed; medication list updated in electronic medical record.  Provider established cholesterol goals reviewed Reviewed role and benefits of statin for ASCVD risk reduction  Hypertension Interventions:  (Status:  Goal on track:  Yes.) Long Term Goal Last practice recorded BP readings:  BP Readings from Last 3 Encounters:  04/10/21 129/66  05/12/20 124/80  05/05/20 (!) 160/90  Most recent eGFR/CrCl: No results found for: EGFR  No  components found for: CRCL  Evaluation of current treatment plan related to hypertension self management and patient's adherence to plan as established by provider Reviewed medications with patient and discussed importance of compliance Discussed plans with patient for ongoing care management follow up and provided patient with direct contact information for care management team Advised patient, providing education and rationale, to monitor blood pressure daily and record, calling PCP for findings outside established parameters  Smoking Cessation Interventions:  (Status:  Patient declined further engagement on this goal.) Long Term Goal  Reinforced benefits of cutting back or stopping smoking  Patient Goals/Self-Care Activities: Take all medications as prescribed Attend all scheduled provider appointments Call pharmacy for medication refills 3-7 days in advance of running out of medications Perform all self care activities independently  Call provider office for new concerns or questions  check blood pressure weekly choose a place to take my blood pressure (home, clinic or office, retail store) take blood pressure log to all doctor appointments keep all doctor appointments eat more whole grains, fruits and vegetables, lean meats and healthy fats limit salt intake to 2329m/day call for medicine refill 2 or 3 days before it runs out take all medications exactly as prescribed  call doctor with any symptoms you believe are related to your medicine  Follow Up Plan:  Telephone follow up appointment with care management team member scheduled for:  12/02/21 The patient has been provided with contact information for the care management team and has been advised to call with any health related questions or concerns.       Plan:Telephone follow up appointment with care management team member scheduled for:  12/02/21 The patient has been provided with contact information for the care management team  and has been advised to call with any health related questions or concerns.  Peter Garter RN, BSN,CCM, CDE Care Management Coordinator [place] [phone], Mobile [phone]

## 2021-09-02 NOTE — Telephone Encounter (Signed)
Caller name:Marjk Halt   On DPR? :Yes  Call back number:[phone]  Provider they see: Richard   Reason for call:Needs refill on  hydrochlorothiazide (MICROZIDE) 12.5 MG capsule  atorvastatin (LIPITOR) 20 MG tablet  benazepril (LOTENSIN) 40 MG tablet    [facility] [place], [place] to [employer]

## 2021-09-02 NOTE — Telephone Encounter (Signed)
Sent to mail order as requested

## 2021-09-02 NOTE — Patient Instructions (Signed)
Visit Information  Thank you for taking time to visit with me today. Please don't hesitate to contact me if I can be of assistance to you before our next scheduled telephone appointment.  Following are the goals we discussed today:  Take all medications as prescribed Attend all scheduled provider appointments Call pharmacy for medication refills 3-7 days in advance of running out of medications Perform all self care activities independently  Call provider office for new concerns or questions  check blood pressure weekly choose a place to take my blood pressure (home, clinic or office, retail store) take blood pressure log to all doctor appointments keep all doctor appointments eat more whole grains, fruits and vegetables, lean meats and healthy fats limit salt intake to 2300mg /day call for medicine refill 2 or 3 days before it runs out take all medications exactly as prescribed call doctor with any symptoms you believe are related to your medicine  Our next appointment is by telephone on 12/02/21 at 11:30 PM  Please call the care guide team at [phone] if you need to cancel or reschedule your appointment.   If you are experiencing a Mental Health or [place] or need someone to talk to, please call the Suicide and Crisis Lifeline: 988 call 1-800-273-TALK (toll free, 24 hour hotline) go to [facility] [address] [place] [phone]) call 911   Patient verbalizes understanding of instructions provided today and agrees to view in [place].  Peter Garter RN, BSN,CCM, CDE Care Management Coordinator [place] [phone], Mobile [phone]

## 2021-11-02 ENCOUNTER — Telehealth: Payer: Self-pay

## 2021-11-02 NOTE — Telephone Encounter (Signed)
Pt wants approval to go to [facility] closer for him to go to

## 2021-11-03 NOTE — Telephone Encounter (Signed)
Pt is wanting to switch to a new doctor at [facility] and they informed him he would need to get approved by his doctor now. [facility] is closer for the pt

## 2021-11-03 NOTE — Telephone Encounter (Signed)
To clarify is this for labs? For a new PCP? What is it he wishes to have at [place] location?

## 2021-11-03 NOTE — Telephone Encounter (Signed)
Pt is asking for your approval to transfer to [facility] provider as it is closer to the pt home

## 2021-11-09 NOTE — Telephone Encounter (Signed)
Fine by me as long as there's a provider there who can take him on!  Thanks,  Denice Paradise

## 2021-11-09 NOTE — Telephone Encounter (Signed)
Called pt and informed him of approval, informed him he will still need to call and ensure their site is taking New Patients at this time pt voiced understanding and appreciation

## 2021-11-13 ENCOUNTER — Encounter: Payer: Self-pay | Admitting: Registered Nurse

## 2021-11-13 ENCOUNTER — Other Ambulatory Visit: Payer: Self-pay

## 2021-11-13 ENCOUNTER — Ambulatory Visit (INDEPENDENT_AMBULATORY_CARE_PROVIDER_SITE_OTHER): Payer: Medicare HMO | Admitting: Registered Nurse

## 2021-11-13 VITALS — BP 126/72 | HR 80 | Temp 98.2°F | Resp 16 | Ht 70.0 in | Wt 211.0 lb

## 2021-11-13 DIAGNOSIS — N529 Male erectile dysfunction, unspecified: Secondary | ICD-10-CM

## 2021-11-13 MED ORDER — SILDENAFIL CITRATE 50 MG PO TABS: 25.0000 mg | ORAL_TABLET | Freq: Every day | ORAL | 3 refills | Status: DC | PRN

## 2021-11-13 NOTE — Progress Notes (Signed)
Established Patient Office Visit  Subjective:  Patient ID: Cameron [name], male    DOB: [birth year]  Age: 71 y.o. MRN: [id number]  CC: No chief complaint on file.   HPI Jes Costales Cavitt presents for erectile dysfunction   Trouble achieving and maintaining  Has not been medicated in the past. No pain with erections No hematospermia or hematuria.   Past Medical History:  Diagnosis Date   Adenomatous colon polyp 05/2009; 11/2014   2010:Tubular adenoma, no high grade dysplasia.Marland Kitchen  + Hyperplastic polyps. 2016: Tubular adenoma-rpt 5 yrs.   Coronary artery disease    H/O chronic gastritis 05/2009   EGD: no h.pylori,dysplasia,or evidence of malignancy   Hyperlipidemia    lovaza from prior PMD-stopped due to cost   Hypertension    Obesity, Class I, BMI 30.0-34.9 (see actual BMI) 04/23/2014   Tobacco dependence     Past Surgical History:  Procedure Laterality Date   APPENDECTOMY  1973   CHOLECYSTECTOMY N/A 07/06/2018   Procedure: LAPAROSCOPIC CHOLECYSTECTOMY WITH INTRAOPERATIVE CHOLANGIOGRAM;  Surgeon: Alphonsa Overall, MD;  Location: [facility];  Service: General;  Laterality: N/A;   COLONOSCOPY  2010   [facility]   EYE SURGERY     STRABISMUS SURGERY  age 28 or 23    Family History  Problem Relation Age of Onset   Heart disease Mother    COPD Father    Hypertension Father    Hyperlipidemia Father    Diabetes Father    Heart attack Father    Dementia Father    Alcohol abuse Maternal Grandfather     Social History   Socioeconomic History   Marital status: Widowed    Spouse name: Not on file   Number of children: 2   Years of education: Not on file   Highest education level: Not on file  Occupational History   Occupation: Retired    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   Works with entertainment companies to set up venues    Orig from [place], has lived in [place] since 1970.   Tobacco 80 pack-yr hx, ongoing as of 03/2014.   Rare alcohol.  No drug use except DISTANT use of marijuana.   Social Determinants of Health   Financial Resource Strain: Low Risk    Difficulty of Paying Living Expenses: Not hard at all  Food Insecurity: No Food Insecurity   Worried About Charity fundraiser in the Last Year: Never true   Friendship in the Last Year: Never true  Transportation Needs: No Transportation Needs   Lack of Transportation (Medical): No   Lack of Transportation (Non-Medical): No  Physical Activity: Insufficiently Active   Days of Exercise per Week: 3 days   Minutes of Exercise per Session: 20 min  Stress: No Stress Concern Present   Feeling of Stress : Not at all  Social Connections: Not on file  Intimate Partner Violence: Not on file  Outpatient Medications Prior to Visit  Medication Sig Dispense Refill   aspirin 81 MG tablet Take 81 mg by mouth daily.     atorvastatin (LIPITOR) 20 MG tablet Take 1 tablet (20 mg total) by mouth daily. 90 tablet 0   benazepril (LOTENSIN) 40 MG tablet Take 1 tablet (40 mg total) by mouth daily. 90 tablet 0   hydrochlorothiazide (MICROZIDE) 12.5 MG capsule Take 1 capsule (12.5 mg total) by mouth daily. 90 capsule 0   benzonatate (TESSALON PERLES) 100 MG capsule Take 1 capsule (100 mg total) by mouth 3 (three) times daily as needed. (Patient not taking: Reported on 11/13/2021) 20 capsule 0   No facility-administered medications prior to visit.    No Known Allergies  ROS Review of Systems  Constitutional: Negative.   HENT: Negative.    Eyes: Negative.   Respiratory: Negative.    Cardiovascular:  Negative.   Gastrointestinal: Negative.   Genitourinary: Negative.   Musculoskeletal: Negative.   Skin: Negative.   Neurological: Negative.   Psychiatric/Behavioral: Negative.    All other systems reviewed and are negative.    Objective:    Physical Exam Constitutional:      General: He is not in acute distress.    Appearance: Normal appearance. He is normal weight. He is not ill-appearing, toxic-appearing or diaphoretic.  Cardiovascular:     Rate and Rhythm: Normal rate and regular rhythm.     Heart sounds: Normal heart sounds. No murmur heard.   No friction rub. No gallop.  Pulmonary:     Effort: Pulmonary effort is normal. No respiratory distress.     Breath sounds: Normal breath sounds. No stridor. No wheezing, rhonchi or rales.  Chest:     Chest wall: No tenderness.  Skin:    General: Skin is warm and dry.     Coloration: Skin is not jaundiced or pale.     Findings: No bruising, erythema, lesion or rash.  Neurological:     General: No focal deficit present.     Mental Status: He is alert and oriented to person, place, and time. Mental status is at baseline.  Psychiatric:        Mood and Affect: Mood normal.        Behavior: Behavior normal.        Thought Content: Thought content normal.        Judgment: Judgment normal.    BP 126/72    Pulse 80    Temp 98.2 F (36.8 C) (Temporal)    Resp 16    Ht 5\' 10"  (1.778 m)    Wt 211 lb (95.7 kg)    SpO2 99%    BMI 30.28 kg/m  Wt Readings from Last 3 Encounters:  11/13/21 211 lb (95.7 kg)  04/10/21 212 lb 3.2 oz (96.3 kg)  05/12/20 206 lb (93.4 kg)     Health Maintenance Due  Topic Date Due   Zoster Vaccines- Shingrix (1 of 2) Never done    There are no preventive care reminders to display for this patient.  Lab Results  Component Value Date   TSH 1.82 04/16/2015   Lab Results  Component Value Date   WBC 7.9 04/10/2021   HGB 17.8 (H) 04/10/2021   HCT 52.2 (H) 04/10/2021   MCV 94.0 04/10/2021   PLT 296.0  04/10/2021   Lab Results  Component Value Date   NA 135 04/10/2021   K 4.4 04/10/2021   CO2 31 04/10/2021   GLUCOSE 107 (H) 04/10/2021  BUN 7 04/10/2021   CREATININE 0.83 04/10/2021   BILITOT 0.7 04/10/2021   ALKPHOS 108 04/10/2021   AST 15 04/10/2021   ALT 22 04/10/2021   PROT 6.6 04/10/2021   ALBUMIN 4.3 04/10/2021   CALCIUM 9.3 04/10/2021   ANIONGAP 8 06/28/2018   GFR 90.20 04/10/2021   Lab Results  Component Value Date   CHOL 152 04/10/2021   Lab Results  Component Value Date   HDL 42.60 04/10/2021   Lab Results  Component Value Date   LDLCALC 91 04/10/2021   Lab Results  Component Value Date   TRIG 93.0 04/10/2021   Lab Results  Component Value Date   CHOLHDL 4 04/10/2021   Lab Results  Component Value Date   HGBA1C 6.4 04/10/2021      Assessment & Plan:   Problem List Items Addressed This Visit   None Visit Diagnoses     Erectile dysfunction, unspecified erectile dysfunction type    -  Primary   Relevant Medications   sildenafil (VIAGRA) 50 MG tablet       Meds ordered this encounter  Medications   sildenafil (VIAGRA) 50 MG tablet    Sig: Take 0.5-2 tablets (25-100 mg total) by mouth daily as needed for erectile dysfunction.    Dispense:  30 tablet    Refill:  3    Order Specific Question:   Supervising Provider    Answer:   Carlota Raspberry, JEFFREY R [[id number]]    Follow-up: Return if symptoms worsen or fail to improve.   PLAN Sildenafil 25-100mg  po qd Reviewed risks, benefits, AE Patient encouraged to call clinic with any questions, comments, or concerns.  Maximiano Coss, NP

## 2021-11-13 NOTE — Patient Instructions (Addendum)
Cameron [name] -   Cameron [name] to see you  Call with any concerns  Take 1/2 a tab, 1 tab, 1+1/2 tab, or 2 tabs once daily as needed  Check out Dr. Lorelei Pont or Dr. Damita Dunnings - two names I definitely recommend over at [facility],  [place]    If you have lab work done today you will be contacted with your lab results within the next 2 weeks.  If you have not heard from [place] then please contact us. The fastest way to get your results is to register for My Chart.   IF you received an x-ray today, you will receive an invoice from [facility]. Please contact [facility] at [phone] with questions or concerns regarding your invoice.   IF you received labwork today, you will receive an invoice from [facility]. Please contact [facility] at [phone] with questions or concerns regarding your invoice.   Our billing staff will not be able to assist you with questions regarding bills from these companies.  You will be contacted with the lab results as soon as they are available. The fastest way to get your results is to activate your My Chart account. Instructions are located on the last page of this paperwork. If you have not heard from [place] regarding the results in 2 weeks, please contact this office.

## 2021-12-02 ENCOUNTER — Ambulatory Visit (INDEPENDENT_AMBULATORY_CARE_PROVIDER_SITE_OTHER): Payer: Medicare HMO

## 2021-12-02 DIAGNOSIS — E782 Mixed hyperlipidemia: Secondary | ICD-10-CM

## 2021-12-02 DIAGNOSIS — I2510 Atherosclerotic heart disease of native coronary artery without angina pectoris: Secondary | ICD-10-CM

## 2021-12-02 DIAGNOSIS — I10 Essential (primary) hypertension: Secondary | ICD-10-CM

## 2021-12-02 NOTE — Chronic Care Management (AMB) (Signed)
Chronic Care Management   CCM RN Visit Note  12/02/2021 Name: Cameron [name] MRN: [id number] DOB: [birth year]  Subjective: Cameron [name] is a 71 y.o. year old male who is a primary care patient of Maximiano Coss, NP. The care management team was consulted for assistance with disease management and care coordination needs.    Engaged with patient by telephone for follow up visit in response to provider referral for case management and/or care coordination services.   Consent to Services:  The patient was given information about Chronic Care Management services, agreed to services, and gave verbal consent prior to initiation of services.  Please see initial visit note for detailed documentation.   Patient agreed to services and verbal consent obtained.   Assessment: Review of patient past medical history, allergies, medications, health status, including review of consultants reports, laboratory and other test data, was performed as part of comprehensive evaluation and provision of chronic care management services.   SDOH (Social Determinants of Health) assessments and interventions performed:    CCM Care Plan  No Known Allergies  Outpatient Encounter Medications as of 12/02/2021  Medication Sig   aspirin 81 MG tablet Take 81 mg by mouth daily.   atorvastatin (LIPITOR) 20 MG tablet Take 1 tablet (20 mg total) by mouth daily.   benazepril (LOTENSIN) 40 MG tablet Take 1 tablet (40 mg total) by mouth daily.   benzonatate (TESSALON PERLES) 100 MG capsule Take 1 capsule (100 mg total) by mouth 3 (three) times daily as needed. (Patient not taking: Reported on 11/13/2021)   hydrochlorothiazide (MICROZIDE) 12.5 MG capsule Take 1 capsule (12.5 mg total) by mouth daily.   sildenafil (VIAGRA) 50 MG tablet Take 0.5-2 tablets (25-100 mg total) by mouth daily as needed for erectile dysfunction.   No facility-administered encounter medications on file as of 12/02/2021.    Patient Active Problem List    Diagnosis Date Noted   Abnormal hemoglobin (Hgb) ([place]) 04/08/2020   Need for hepatitis C screening test 04/26/2018   Centrilobular emphysema ([place]) 04/20/2018   Polycythemia 04/20/2018   CAD (coronary artery disease) 03/31/2018   Visit for preventive health examination 02/27/2018   Overweight (BMI 25.0-29.9) 04/23/2014   Tobacco dependence 12/29/2012   Health maintenance examination 03/13/2012   Hyperlipemia, mixed 03/13/2012   HTN (hypertension), benign 02/11/2012    Conditions to be addressed/monitored:CAD, HTN, HLD, and Tobacco Use  Care Plan : RN Care Manager Plan of Care  Updates made by Dimitri Ped, RN since 12/02/2021 12:00 AM     Problem: Chronic Disease Management and Care Coordination Needs (CAD,HTN and HLD)   Priority: High     Long-Range Goal: Establish Plan of Care for Chronic Disease Management Needs (CAD,HTN and HLD)   Start Date: 09/02/2021  Expected End Date: 03/01/2022  Priority: High  Note:   Current Barriers:  Chronic Disease Management support and education needs related to CAD, HTN, HLD, and Tobacco Use  States he has been feeling good. States he has not been checking his B/P recently but it was good when it was checked at the doctors office. States he usually does not add salt to his food and uses a lot of pepper.  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 States he has had a lot of shows recently and he has gotten a lot of activity. Denies any chest pains, shortness of breath or swelling. States he has not cut back on smoking and he is not interested  in quitting smoking at this time.  States his weight was 203 this morning and he is still trying to keep his weight lower.  RNCM Clinical Goal(s):  Patient will verbalize understanding of plan for management of CAD, HTN, HLD, and Tobacco Use as evidenced by voiced adherence to plan of care verbalize basic understanding of  CAD, HTN, HLD, and Tobacco Use disease process and self  health management plan as evidenced by voiced understanding and teach back take all medications exactly as prescribed and will call provider for medication related questions as evidenced by dispense report and pt verbalization demonstrate Improved adherence to prescribed treatment plan for CAD, HTN, HLD, and Tobacco Use as evidenced by readings within limits, voiced adherence to plan of care  through collaboration with RN Care manager, provider, and care team.   Interventions: 1:1 collaboration with primary care provider regarding development and update of comprehensive plan of care as evidenced by provider attestation and co-signature Inter-disciplinary care team collaboration (see longitudinal plan of care) Evaluation of current treatment plan related to  self management and patient's adherence to plan as established by provider   CAD Interventions: (Status:  Goal on track:  Yes.) Long Term Goal Assessed understanding of CAD diagnosis Medications reviewed including medications utilized in CAD treatment plan Provided education on importance of blood pressure control in management of CAD Provided education on Importance of limiting foods high in cholesterol Counseled on importance of regular laboratory monitoring as prescribed Counseled on the importance of exercise goals with target of 150 minutes per week Reviewed Importance of taking all medications as prescribed Reviewed Importance of attending all scheduled provider appointments Advised to report any changes in symptoms or exercise tolerance   Hyperlipidemia Interventions:  (Status:  Goal on track:  Yes.) Long Term Goal Medication review performed; medication list updated in electronic medical record.  Provider established cholesterol goals reviewed Reviewed role and benefits of statin for ASCVD risk reduction Reviewed importance of limiting foods high in cholesterol Reviewed exercise goals and target of 150 minutes per  week  Hypertension Interventions:  (Status:  Goal on track:  Yes.) Long Term Goal Last practice recorded BP readings:  BP Readings from Last 3 Encounters:  11/13/21 126/72  04/10/21 129/66  05/12/20 124/80  Most recent eGFR/CrCl: No results found for: EGFR  No components found for: CRCL  Evaluation of current treatment plan related to hypertension self management and patient's adherence to plan as established by provider Provided education to patient re: stroke prevention, s/s of heart attack and stroke Reviewed medications with patient and discussed importance of compliance Discussed plans with patient for ongoing care management follow up and provided patient with direct contact information for care management team Advised patient, providing education and rationale, to monitor blood pressure daily and record, calling PCP for findings outside established parameters Reviewed to try to check B/P at least weekly  Smoking Cessation Interventions:  (Status:  Patient declined further engagement on this goal.) Long Term Goal  Reinforced benefits of cutting back or stopping smoking  Patient Goals/Self-Care Activities: Take all medications as prescribed Attend all scheduled provider appointments Call pharmacy for medication refills 3-7 days in advance of running out of medications Perform all self care activities independently  Call provider office for new concerns or questions  check blood pressure weekly choose a place to take my blood pressure (home, clinic or office, retail store) take blood pressure log to all doctor appointments keep all doctor appointments eat more whole grains, fruits and vegetables,  lean meats and healthy fats limit salt intake to 2359m/day Maintain active lifestyle call for medicine refill 2 or 3 days before it runs out take all medications exactly as prescribed call doctor with any symptoms you believe are related to your medicine  Follow Up Plan:  Telephone  follow up appointment with care management team member scheduled for:  03/10/22 The patient has been provided with contact information for the care management team and has been advised to call with any health related questions or concerns.       Plan:Telephone follow up appointment with care management team member scheduled for:  03/10/22 The patient has been provided with contact information for the care management team and has been advised to call with any health related questions or concerns.  MPeter GarterRN, BJackquline Denmark CDE Care Management Coordinator [facility] ([phone]

## 2021-12-02 NOTE — Patient Instructions (Signed)
Visit Information ? ?Thank you for taking time to visit with me today. Please don't hesitate to contact me if I can be of assistance to you before our next scheduled telephone appointment. ? ?Following are the goals we discussed today:  ?Take all medications as prescribed ?Attend all scheduled provider appointments ?Call pharmacy for medication refills 3-7 days in advance of running out of medications ?Perform all self care activities independently  ?Call provider office for new concerns or questions  ?check blood pressure weekly ?choose a place to take my blood pressure (home, clinic or office, retail store) ?take blood pressure log to all doctor appointments ?keep all doctor appointments ?eat more whole grains, fruits and vegetables, lean meats and healthy fats ?limit salt intake to '2300mg'$ /day ?Maintain active lifestyle ?call for medicine refill 2 or 3 days before it runs out ?take all medications exactly as prescribed ?call doctor with any symptoms you believe are related to your medicine ? ?Our next appointment is by telephone on 03/10/22 at 11:30 AM ? ?Please call the care guide team at [phone] if you need to cancel or reschedule your appointment.  ? ?If you are experiencing a Mental Health or [place] or need someone to talk to, please call the Suicide and Crisis Lifeline: 988 ?call the [place] National Suicide Prevention Lifeline: 412-160-6099 or TTY: (812)060-2933 TTY (352)389-3583) to talk to a trained counselor ?call 1-800-273-TALK (toll free, 24 hour hotline) ?go to [facility] [address] [place] [phone]) ?call 911  ? ?Patient verbalizes understanding of instructions and care plan provided today and agrees to view in [place]. Active MyChart status confirmed with patient.   ? ?Peter Garter RN, BSN,CCM, CDE ?Care Management Coordinator ?[facility] ?[phone]   ?

## 2021-12-07 ENCOUNTER — Telehealth: Payer: Self-pay

## 2021-12-07 NOTE — Telephone Encounter (Signed)
Encourage patient to contact the pharmacy for refills or they can request refills through [facility] ? ?(Please schedule appointment if patient has not been seen in over a year) ? ? ? ?WHAT PHARMACY WOULD THEY LIKE THIS SENT TO: [facility] [place], [place] to Registered [facility] Sites  ? ?MEDICATION NAME & DOSE:atorvastatin (LIPITOR) 20 MG tablet , hydrochlorothiazide (MICROZIDE) 12.5 MG capsule , benazepril (LOTENSIN) 40 MG tablet  ? ?NOTES/COMMENTS FROM PATIENT: ? ? ? ? ? ?[place] office please notify patient: ?It takes 48-72 hours to process rx refill requests ?Ask patient to call pharmacy to ensure rx is ready before heading there.  ? ?

## 2021-12-08 ENCOUNTER — Other Ambulatory Visit: Payer: Self-pay

## 2021-12-08 DIAGNOSIS — E782 Mixed hyperlipidemia: Secondary | ICD-10-CM

## 2021-12-08 DIAGNOSIS — I10 Essential (primary) hypertension: Secondary | ICD-10-CM

## 2021-12-08 MED ORDER — HYDROCHLOROTHIAZIDE 12.5 MG PO CAPS: 12.5000 mg | ORAL_CAPSULE | Freq: Every day | ORAL | 0 refills | Status: DC

## 2021-12-08 MED ORDER — BENAZEPRIL HCL 40 MG PO TABS: 40.0000 mg | ORAL_TABLET | Freq: Every day | ORAL | 0 refills | Status: DC

## 2021-12-08 MED ORDER — ATORVASTATIN CALCIUM 20 MG PO TABS: 20.0000 mg | ORAL_TABLET | Freq: Every day | ORAL | 0 refills | Status: DC

## 2021-12-08 NOTE — Telephone Encounter (Signed)
Sent!

## 2021-12-25 DIAGNOSIS — E782 Mixed hyperlipidemia: Secondary | ICD-10-CM | POA: Diagnosis not present

## 2021-12-25 DIAGNOSIS — I119 Hypertensive heart disease without heart failure: Secondary | ICD-10-CM

## 2021-12-26 HISTORY — PX: FINGER SURGERY: SHX640

## 2021-12-28 ENCOUNTER — Emergency Department (HOSPITAL_COMMUNITY)
Admission: EM | Admit: 2021-12-28 | Discharge: 2021-12-28 | Disposition: A | Discharge status: Home / Self Care | Payer: Worker's Compensation | Attending: Emergency Medicine | Admitting: Emergency Medicine

## 2021-12-28 ENCOUNTER — Encounter (HOSPITAL_COMMUNITY): Payer: Self-pay

## 2021-12-28 ENCOUNTER — Other Ambulatory Visit: Payer: Self-pay

## 2021-12-28 ENCOUNTER — Emergency Department (HOSPITAL_COMMUNITY): Payer: Worker's Compensation

## 2021-12-28 DIAGNOSIS — S62663A Nondisplaced fracture of distal phalanx of left middle finger, initial encounter for closed fracture: Secondary | ICD-10-CM | POA: Insufficient documentation

## 2021-12-28 DIAGNOSIS — Y990 Civilian activity done for income or pay: Secondary | ICD-10-CM | POA: Insufficient documentation

## 2021-12-28 DIAGNOSIS — Z7982 Long term (current) use of aspirin: Secondary | ICD-10-CM | POA: Diagnosis not present

## 2021-12-28 DIAGNOSIS — W230XXA Caught, crushed, jammed, or pinched between moving objects, initial encounter: Secondary | ICD-10-CM | POA: Diagnosis not present

## 2021-12-28 DIAGNOSIS — S62663B Nondisplaced fracture of distal phalanx of left middle finger, initial encounter for open fracture: Secondary | ICD-10-CM

## 2021-12-28 DIAGNOSIS — S6992XA Unspecified injury of left wrist, hand and finger(s), initial encounter: Secondary | ICD-10-CM | POA: Diagnosis present

## 2021-12-28 DIAGNOSIS — S68119A Complete traumatic metacarpophalangeal amputation of unspecified finger, initial encounter: Secondary | ICD-10-CM

## 2021-12-28 MED ORDER — CEPHALEXIN 500 MG PO CAPS: 500.0000 mg | ORAL_CAPSULE | Freq: Three times a day (TID) | ORAL | 0 refills | Status: AC

## 2021-12-28 MED ORDER — HYDROCODONE-ACETAMINOPHEN 5-325 MG PO TABS: 1.0000 | ORAL_TABLET | ORAL | 0 refills | Status: DC | PRN

## 2021-12-28 NOTE — ED Triage Notes (Signed)
Pt smashed his fingers while moving some boxes at work cutting off tips of his left middle and ring finger. Pt has the tip of his middle finger in a bag. Bleeding mostly controlled at this time.  ?

## 2021-12-28 NOTE — Discharge Instructions (Signed)
Keep fingers clean and dry.  Splint for protection. ?Take antibiotics as prescribed to prevent infection. ?Take Norco as needed as prescribed for pain.  This can cause constipation.  Do not drive or operate machinery if taking Norco. ? ?Follow-up with Dr. Isidoro Donning call to schedule appointment. ?

## 2021-12-28 NOTE — ED Notes (Signed)
Patient's hand is now soaking in sodium chloride and iodine ?

## 2021-12-28 NOTE — ED Provider Notes (Signed)
?[facility] DEPT ?Provider Note ? ? ?CSN: [id number] ?Arrival date & time: 12/28/21  1332 ? ?  ? ?History ? ?Chief Complaint  ?Patient presents with  ? Laceration  ? ? ?Cameron [name] is a 71 y.o. male. ? ?71 year old male presents with injury to the left third and fourth fingers which occurred today while he was at work moving some boxes and crushed the tips of his fingers.  Patient is not anticoagulated, last tetanus was 2 years ago.  Bleeding controlled with dressing prior to arrival.  No other injuries or concerns.  Patient is right-hand dominant. ? ? ?  ? ?Home Medications ?Prior to Admission medications   ?Medication Sig Start Date End Date Taking? Authorizing Provider  ?cephALEXin (KEFLEX) 500 MG capsule Take 1 capsule (500 mg total) by mouth 3 (three) times daily for 10 days. 12/28/21 01/07/22 Yes Tacy Learn, PA-C  ?HYDROcodone-acetaminophen (NORCO/VICODIN) 5-325 MG tablet Take 1 tablet by mouth every 4 (four) hours as needed. 12/28/21  Yes Tacy Learn, PA-C  ?aspirin 81 MG tablet Take 81 mg by mouth daily.    [provider]  ?atorvastatin (LIPITOR) 20 MG tablet Take 1 tablet (20 mg total) by mouth daily. 12/08/21   Maximiano Coss, NP  ?benazepril (LOTENSIN) 40 MG tablet Take 1 tablet (40 mg total) by mouth daily. 12/08/21   Maximiano Coss, NP  ?benzonatate (TESSALON PERLES) 100 MG capsule Take 1 capsule (100 mg total) by mouth 3 (three) times daily as needed. ?Patient not taking: Reported on 11/13/2021 03/31/21   Lucretia Kern, DO  ?hydrochlorothiazide (MICROZIDE) 12.5 MG capsule Take 1 capsule (12.5 mg total) by mouth daily. 12/08/21   Maximiano Coss, NP  ?sildenafil (VIAGRA) 50 MG tablet Take 0.5-2 tablets (25-100 mg total) by mouth daily as needed for erectile dysfunction. 11/13/21   Maximiano Coss, NP  ?   ? ?Allergies    ?Patient has no known allergies.   ? ?Review of Systems   ?Review of Systems ?Negative except as per HPI ?Physical Exam ?Updated Vital Signs ?BP  (!) 131/95 (BP Location: Right Arm)   Pulse 75   Temp 97.9 ?F (36.6 ?C) (Oral)   Resp 18   SpO2 98%  ?Physical Exam ?Vitals and nursing note reviewed.  ?Constitutional:   ?   General: He is not in acute distress. ?   Appearance: He is well-developed. He is not diaphoretic.  ?HENT:  ?   Head: Normocephalic and atraumatic.  ?Cardiovascular:  ?   Pulses: Normal pulses.  ?Pulmonary:  ?   Effort: Pulmonary effort is normal.  ?Musculoskeletal:     ?   General: Tenderness and signs of injury present.  ?   Comments: Injury to the distal left third and fourth fingers.  Avulsion of the left third nail with amputation of the tip of the finger, no obvious bone visible.  Avulsion to tip of left fourth finger, nail intact.  ?Skin: ?   General: Skin is warm and dry.  ?Neurological:  ?   Mental Status: He is alert and oriented to person, place, and time.  ?   Sensory: No sensory deficit.  ?   Motor: No weakness.  ?Psychiatric:     ?   Behavior: Behavior normal.  ? ? ? ? ? ? ? ? ? ?ED Results / Procedures / Treatments   ?Labs ?(all labs ordered are listed, but only abnormal results are displayed) ?Labs Reviewed - No data to display ? ?EKG ?None ? ?  Radiology ?DG Hand Complete Left ? ?Result Date: 12/28/2021 ?CLINICAL DATA:  Injuries to third and fourth fingers EXAM: LEFT HAND - COMPLETE 3+ VIEW COMPARISON:  None FINDINGS: Partial amputations of the distal phalanges of the LEFT middle and ring fingers. Loss of cortical margination at the tip of the distal phalanx middle finger consistent with fracture. No additional fracture, dislocation, or bone destruction. Joint spaces preserved. IMPRESSION: Amputations through distal aspects of the LEFT middle and ring fingers. Fracture at tip of distal phalanx, LEFT middle finger. No osseous abnormalities, LEFT ring finger. Electronically Signed   By: Lavonia Dana M.D.   On: 12/28/2021 14:34   ? ?Procedures ?Procedures  ? ? ?Medications Ordered in ED ?Medications - No data to display ? ?ED  Course/ Medical Decision Making/ A&P ?  ?                        ?Medical Decision Making ?Amount and/or Complexity of Data Reviewed ?Radiology: ordered. ? ? ?71 year old male with injury to left hand which occurred at work today involving his left third and fourth fingers.  Found to have amputation of the distal tip of the third and fourth fingers with avulsion of the left third nail.  Wounds were irrigated, found to have fracture of the tip of the third finger.  Discussed with Hilbert Odor, PA-C, on-call with hand Ortho.  Plan is for nonadherent dressing, splint, oral antibiotics and follow-up with orthopedics.  Discussed results and plan of care with patient and significant other at bedside who verbalized understanding and will call to arrange follow-up. ? ? ? ? ? ? ? ?Final Clinical Impression(s) / ED Diagnoses ?Final diagnoses:  ?Open nondisplaced fracture of distal phalanx of left middle finger, initial encounter  ?Traumatic amputation of finger tip, initial encounter  ? ? ?Rx / DC Orders ?ED Discharge Orders   ? ?      Ordered  ?  cephALEXin (KEFLEX) 500 MG capsule  3 times daily       ? 12/28/21 1504  ?  HYDROcodone-acetaminophen (NORCO/VICODIN) 5-325 MG tablet  Every 4 hours PRN       ? 12/28/21 1504  ? ?  ?  ? ?  ? ? ?  ?Tacy Learn, PA-C ?12/28/21 1518 ? ?  ?Varney Biles, MD ?12/29/21 [phone] ? ?

## 2022-02-25 ENCOUNTER — Encounter: Payer: Self-pay | Admitting: Registered Nurse

## 2022-02-25 ENCOUNTER — Ambulatory Visit (INDEPENDENT_AMBULATORY_CARE_PROVIDER_SITE_OTHER): Payer: Medicare HMO | Admitting: Registered Nurse

## 2022-02-25 ENCOUNTER — Other Ambulatory Visit: Payer: Self-pay

## 2022-02-25 VITALS — BP 144/76 | HR 79 | Temp 98.3°F | Resp 18 | Ht 70.0 in | Wt 207.4 lb

## 2022-02-25 DIAGNOSIS — J069 Acute upper respiratory infection, unspecified: Secondary | ICD-10-CM | POA: Diagnosis not present

## 2022-02-25 MED ORDER — PREDNISONE 10 MG (21) PO TBPK: ORAL_TABLET | ORAL | 0 refills | Status: DC

## 2022-02-25 MED ORDER — AMOXICILLIN-POT CLAVULANATE 875-125 MG PO TABS: 1.0000 | ORAL_TABLET | Freq: Two times a day (BID) | ORAL | 0 refills | Status: DC

## 2022-02-25 NOTE — Patient Instructions (Addendum)
Mr. [name] -   Doristine Devoid to see you  Augmentin and prednisone - finish supply even if you feel better  Thanks,  Rich     If you have lab work done today you will be contacted with your lab results within the next 2 weeks.  If you have not heard from [place] then please contact us. The fastest way to get your results is to register for My Chart.   IF you received an x-ray today, you will receive an invoice from [facility]. Please contact [facility] at [phone] with questions or concerns regarding your invoice.   IF you received labwork today, you will receive an invoice from [place]. Please contact [facility] at [phone] with questions or concerns regarding your invoice.   Our billing staff will not be able to assist you with questions regarding bills from these companies.  You will be contacted with the lab results as soon as they are available. The fastest way to get your results is to activate your My Chart account. Instructions are located on the last page of this paperwork. If you have not heard from [place] regarding the results in 2 weeks, please contact this office.

## 2022-02-25 NOTE — Progress Notes (Signed)
Acute Office Visit  Subjective:    Patient ID: Cameron [name], male    DOB: Aug 17, 1951, 71 y.o.   MRN: [id number]  Chief Complaint  Patient presents with   Nasal Congestion    Patient states he has been having some congestion 10 days ago and now needs a prescription.    HPI Patient is in today for nasal congestion  Onset 10 days ago Sinus pressure, ear pressure Rhinorrhea  No shob, chest congestion, doe, nvd  Outpatient Medications Prior to Visit  Medication Sig Dispense Refill   aspirin 81 MG tablet Take 81 mg by mouth daily.     atorvastatin (LIPITOR) 20 MG tablet Take 1 tablet (20 mg total) by mouth daily. 90 tablet 0   benazepril (LOTENSIN) 40 MG tablet Take 1 tablet (40 mg total) by mouth daily. 90 tablet 0   hydrochlorothiazide (MICROZIDE) 12.5 MG capsule Take 1 capsule (12.5 mg total) by mouth daily. 90 capsule 0   HYDROcodone-acetaminophen (NORCO/VICODIN) 5-325 MG tablet Take 1 tablet by mouth every 4 (four) hours as needed. 10 tablet 0   sildenafil (VIAGRA) 50 MG tablet Take 0.5-2 tablets (25-100 mg total) by mouth daily as needed for erectile dysfunction. 30 tablet 3   benzonatate (TESSALON PERLES) 100 MG capsule Take 1 capsule (100 mg total) by mouth 3 (three) times daily as needed. (Patient not taking: Reported on 11/13/2021) 20 capsule 0   No facility-administered medications prior to visit.    Review of Systems  Constitutional: Negative.   HENT: Negative.    Eyes: Negative.   Respiratory: Negative.    Cardiovascular: Negative.   Gastrointestinal: Negative.   Genitourinary: Negative.   Musculoskeletal: Negative.   Skin: Negative.   Neurological: Negative.   Psychiatric/Behavioral: Negative.    All other systems reviewed and are negative.     Objective:    BP (!) 144/76   Pulse 79   Temp 98.3 F (36.8 C) (Temporal)   Resp 18   Ht '5\' 10"'$  (1.778 m)   Wt 207 lb 6.4 oz (94.1 kg)   SpO2 99%   BMI 29.76 kg/m  Physical Exam Constitutional:       General: He is not in acute distress.    Appearance: Normal appearance. He is normal weight. He is not ill-appearing, toxic-appearing or diaphoretic.  Cardiovascular:     Rate and Rhythm: Normal rate and regular rhythm.     Heart sounds: Normal heart sounds. No murmur heard.   No friction rub. No gallop.  Pulmonary:     Effort: Pulmonary effort is normal. No respiratory distress.     Breath sounds: Normal breath sounds. No stridor. No wheezing, rhonchi or rales.  Chest:     Chest wall: No tenderness.  Neurological:     General: No focal deficit present.     Mental Status: He is alert and oriented to person, place, and time. Mental status is at baseline.  Psychiatric:        Mood and Affect: Mood normal.        Behavior: Behavior normal.        Thought Content: Thought content normal.        Judgment: Judgment normal.    No results found for any visits on 02/25/22.      Assessment & Plan:  1. Acute URI - amoxicillin-clavulanate (AUGMENTIN) 875-125 MG tablet; Take 1 tablet by mouth 2 (two) times daily.  Dispense: 20 tablet; Refill: 0 - predniSONE (STERAPRED UNI-PAK 21 TAB) 10 MG (  21) TBPK tablet; Take per package instructions. Do not skip doses. Finish entire supply.  Dispense: 1 each; Refill: 0    Meds ordered this encounter  Medications   amoxicillin-clavulanate (AUGMENTIN) 875-125 MG tablet    Sig: Take 1 tablet by mouth 2 (two) times daily.    Dispense:  20 tablet    Refill:  0    Order Specific Question:   Supervising Provider    Answer:   Carlota Raspberry, JEFFREY R [[id number]]   predniSONE (STERAPRED UNI-PAK 21 TAB) 10 MG (21) TBPK tablet    Sig: Take per package instructions. Do not skip doses. Finish entire supply.    Dispense:  1 each    Refill:  0    Order Specific Question:   Supervising Provider    Answer:   Carlota Raspberry, JEFFREY R [[id number]]    Return if symptoms worsen or fail to improve.  PLAN Augmentin and prednisone for acute URI Recommend continue OTC Return prn Patient  encouraged to call clinic with any questions, comments, or concerns.   Maximiano Coss, NP

## 2022-03-03 DIAGNOSIS — M9903 Segmental and somatic dysfunction of lumbar region: Secondary | ICD-10-CM | POA: Diagnosis not present

## 2022-03-03 DIAGNOSIS — M9904 Segmental and somatic dysfunction of sacral region: Secondary | ICD-10-CM | POA: Diagnosis not present

## 2022-03-03 DIAGNOSIS — M9905 Segmental and somatic dysfunction of pelvic region: Secondary | ICD-10-CM | POA: Diagnosis not present

## 2022-03-03 DIAGNOSIS — M5136 Other intervertebral disc degeneration, lumbar region: Secondary | ICD-10-CM | POA: Diagnosis not present

## 2022-03-04 ENCOUNTER — Other Ambulatory Visit: Payer: Self-pay

## 2022-03-04 DIAGNOSIS — I10 Essential (primary) hypertension: Secondary | ICD-10-CM

## 2022-03-04 DIAGNOSIS — E782 Mixed hyperlipidemia: Secondary | ICD-10-CM

## 2022-03-04 MED ORDER — ATORVASTATIN CALCIUM 20 MG PO TABS: 20.0000 mg | ORAL_TABLET | Freq: Every day | ORAL | 0 refills | Status: DC

## 2022-03-04 MED ORDER — HYDROCHLOROTHIAZIDE 12.5 MG PO CAPS: 12.5000 mg | ORAL_CAPSULE | Freq: Every day | ORAL | 0 refills | Status: DC

## 2022-03-04 MED ORDER — BENAZEPRIL HCL 40 MG PO TABS: 40.0000 mg | ORAL_TABLET | Freq: Every day | ORAL | 0 refills | Status: DC

## 2022-03-10 ENCOUNTER — Ambulatory Visit (INDEPENDENT_AMBULATORY_CARE_PROVIDER_SITE_OTHER): Payer: Medicare HMO

## 2022-03-10 DIAGNOSIS — M9903 Segmental and somatic dysfunction of lumbar region: Secondary | ICD-10-CM | POA: Diagnosis not present

## 2022-03-10 DIAGNOSIS — I10 Essential (primary) hypertension: Secondary | ICD-10-CM

## 2022-03-10 DIAGNOSIS — I2510 Atherosclerotic heart disease of native coronary artery without angina pectoris: Secondary | ICD-10-CM

## 2022-03-10 DIAGNOSIS — F17200 Nicotine dependence, unspecified, uncomplicated: Secondary | ICD-10-CM

## 2022-03-10 DIAGNOSIS — M5136 Other intervertebral disc degeneration, lumbar region: Secondary | ICD-10-CM | POA: Diagnosis not present

## 2022-03-10 DIAGNOSIS — E782 Mixed hyperlipidemia: Secondary | ICD-10-CM

## 2022-03-10 DIAGNOSIS — M9905 Segmental and somatic dysfunction of pelvic region: Secondary | ICD-10-CM | POA: Diagnosis not present

## 2022-03-10 DIAGNOSIS — M9904 Segmental and somatic dysfunction of sacral region: Secondary | ICD-10-CM | POA: Diagnosis not present

## 2022-03-10 NOTE — Chronic Care Management (AMB) (Cosign Needed)
Chronic Care Management   CCM RN Visit Note  03/10/2022 Name: Cameron [name] MRN: [id number] DOB: [birth year]  Subjective: Cameron [name] is a 71 y.o. year old male who is a primary care patient of Maximiano Coss, NP. The care management team was consulted for assistance with disease management and care coordination needs.    Engaged with patient by telephone for follow up visit in response to provider referral for case management and/or care coordination services.   Consent to Services:  The patient was given information about Chronic Care Management services, agreed to services, and gave verbal consent prior to initiation of services.  Please see initial visit note for detailed documentation.   Patient agreed to services and verbal consent obtained.   Assessment: Review of patient past medical history, allergies, medications, health status, including review of consultants reports, laboratory and other test data, was performed as part of comprehensive evaluation and provision of chronic care management services.   SDOH (Social Determinants of Health) assessments and interventions performed:    CCM Care Plan  No Known Allergies  Outpatient Encounter Medications as of 03/10/2022  Medication Sig   amoxicillin-clavulanate (AUGMENTIN) 875-125 MG tablet Take 1 tablet by mouth 2 (two) times daily.   aspirin 81 MG tablet Take 81 mg by mouth daily.   atorvastatin (LIPITOR) 20 MG tablet Take 1 tablet (20 mg total) by mouth daily.   benazepril (LOTENSIN) 40 MG tablet Take 1 tablet (40 mg total) by mouth daily.   benzonatate (TESSALON PERLES) 100 MG capsule Take 1 capsule (100 mg total) by mouth 3 (three) times daily as needed. (Patient not taking: Reported on 11/13/2021)   hydrochlorothiazide (MICROZIDE) 12.5 MG capsule Take 1 capsule (12.5 mg total) by mouth daily.   HYDROcodone-acetaminophen (NORCO/VICODIN) 5-325 MG tablet Take 1 tablet by mouth every 4 (four) hours as needed.   predniSONE  (STERAPRED UNI-PAK 21 TAB) 10 MG (21) TBPK tablet Take per package instructions. Do not skip doses. Finish entire supply.   sildenafil (VIAGRA) 50 MG tablet Take 0.5-2 tablets (25-100 mg total) by mouth daily as needed for erectile dysfunction.   No facility-administered encounter medications on file as of 03/10/2022.    Patient Active Problem List   Diagnosis Date Noted   Abnormal hemoglobin (Hgb) ([place]) 04/08/2020   Need for hepatitis C screening test 04/26/2018   Centrilobular emphysema ([place]) 04/20/2018   Polycythemia 04/20/2018   CAD (coronary artery disease) 03/31/2018   Visit for preventive health examination 02/27/2018   Overweight (BMI 25.0-29.9) 04/23/2014   Tobacco dependence 12/29/2012   Health maintenance examination 03/13/2012   Hyperlipemia, mixed 03/13/2012   HTN (hypertension), benign 02/11/2012    Conditions to be addressed/monitored:CAD, HTN, and HLD  Care Plan : RN Care Manager Plan of Care  Updates made by Dimitri Ped, RN since 03/10/2022 12:00 AM  Completed 03/10/2022   Problem: Chronic Disease Management and Care Coordination Needs (CAD,HTN and HLD) Resolved 03/10/2022  Priority: High     Long-Range Goal: Establish Plan of Care for Chronic Disease Management Needs (CAD,HTN and HLD) Completed 03/10/2022  Start Date: 09/02/2021  Expected End Date: 03/01/2022  Priority: High  Note:   Case closed goals met Current Barriers:  Chronic Disease Management support and education needs related to CAD, HTN, HLD, and Tobacco Use  States he is recovering from a hand injury at work so he has not been able to work as he recovers.  States he is trying to stay busy and is walking for exercise.  States he is keeping his weight stable and weighted 204 today. States he has not been checking his B/P recently but it was good when it was checked at the doctors office. States he usually does not add salt to his food and uses a lot of pepper.  Denies any chest pains, shortness of  breath or swelling. States he has not cut back on smoking and he is not interested in quitting smoking at this time.  States he is getting married in September.  RNCM Clinical Goal(s):  Patient will verbalize understanding of plan for management of CAD, HTN, HLD, and Tobacco Use as evidenced by voiced adherence to plan of care verbalize basic understanding of  CAD, HTN, HLD, and Tobacco Use disease process and self health management plan as evidenced by voiced understanding and teach back take all medications exactly as prescribed and will call provider for medication related questions as evidenced by dispense report and pt verbalization demonstrate Improved adherence to prescribed treatment plan for CAD, HTN, HLD, and Tobacco Use as evidenced by readings within limits, voiced adherence to plan of care  through collaboration with RN Care manager, provider, and care team.   Interventions: 1:1 collaboration with primary care provider regarding development and update of comprehensive plan of care as evidenced by provider attestation and co-signature Inter-disciplinary care team collaboration (see longitudinal plan of care) Evaluation of current treatment plan related to  self management and patient's adherence to plan as established by provider   CAD Interventions: (Status:  Goal Met.) Long Term Goal Assessed understanding of CAD diagnosis Medications reviewed including medications utilized in CAD treatment plan Provided education on importance of blood pressure control in management of CAD Provided education on Importance of limiting foods high in cholesterol Counseled on importance of regular laboratory monitoring as prescribed Counseled on the importance of exercise goals with target of 150 minutes per week Reviewed Importance of taking all medications as prescribed Reviewed Importance of attending all scheduled provider appointments Advised to report any changes in symptoms or exercise  tolerance   Hyperlipidemia Interventions:  (Status:  Goal Met.) Long Term Goal Medication review performed; medication list updated in electronic medical record.  Provider established cholesterol goals reviewed Reviewed role and benefits of statin for ASCVD risk reduction Reviewed importance of limiting foods high in cholesterol Reviewed exercise goals and target of 150 minutes per week  Hypertension Interventions:  (Status:  Goal Met.) Long Term Goal Last practice recorded BP readings:  BP Readings from Last 3 Encounters:  11/13/21 126/72  04/10/21 129/66  05/12/20 124/80  Most recent eGFR/CrCl: No results found for: EGFR  No components found for: CRCL  Evaluation of current treatment plan related to hypertension self management and patient's adherence to plan as established by provider Provided education to patient re: stroke prevention, s/s of heart attack and stroke Reviewed medications with patient and discussed importance of compliance Discussed plans with patient for ongoing care management follow up and provided patient with direct contact information for care management team Advised patient, providing education and rationale, to monitor blood pressure daily and record, calling PCP for findings outside established parameters Reviewed to try to check B/P at least weekly  Smoking Cessation Interventions:  (Status:  Patient declined further engagement on this goal.) Long Term Goal  Reinforced benefits of cutting back or stopping smoking  Patient Goals/Self-Care Activities: Take all medications as prescribed Attend all scheduled provider appointments Call pharmacy for medication refills 3-7 days in advance of running out of medications Perform  all self care activities independently  Call provider office for new concerns or questions  check blood pressure weekly choose a place to take my blood pressure (home, clinic or office, retail store) take blood pressure log to all  doctor appointments keep all doctor appointments eat more whole grains, fruits and vegetables, lean meats and healthy fats limit salt intake to $RemoveB'2300mg'YIgdYJis$ /day Maintain active lifestyle call for medicine refill 2 or 3 days before it runs out take all medications exactly as prescribed call doctor with any symptoms you believe are related to your medicine  Follow Up Plan:  The patient has been provided with contact information for the care management team and has been advised to call with any health related questions or concerns.  Case closed goals met      Plan:The patient has been provided with contact information for the care management team and has been advised to call with any health related questions or concerns.  Case closed goals met Peter Garter RN, [facility], CDE Care Management Coordinator [facility] [phone]

## 2022-03-10 NOTE — Patient Instructions (Signed)
Visit Information  Thank you for allowing me to share the care management and care coordination services that are available to you as part of your health plan and services through your primary care provider and medical home. Please reach out to me at [phone] if the care management/care coordination team may be of assistance to you in the future.   Ashlie Mcmenamy RN, BSN,CCM, CDE Care Management Coordinator [facility] [phone]   

## 2022-03-24 DIAGNOSIS — M9903 Segmental and somatic dysfunction of lumbar region: Secondary | ICD-10-CM | POA: Diagnosis not present

## 2022-03-24 DIAGNOSIS — M5136 Other intervertebral disc degeneration, lumbar region: Secondary | ICD-10-CM | POA: Diagnosis not present

## 2022-03-24 DIAGNOSIS — M9904 Segmental and somatic dysfunction of sacral region: Secondary | ICD-10-CM | POA: Diagnosis not present

## 2022-03-24 DIAGNOSIS — M9905 Segmental and somatic dysfunction of pelvic region: Secondary | ICD-10-CM | POA: Diagnosis not present

## 2022-03-26 DIAGNOSIS — I2510 Atherosclerotic heart disease of native coronary artery without angina pectoris: Secondary | ICD-10-CM | POA: Diagnosis not present

## 2022-03-26 DIAGNOSIS — F17210 Nicotine dependence, cigarettes, uncomplicated: Secondary | ICD-10-CM | POA: Diagnosis not present

## 2022-03-26 DIAGNOSIS — R69 Illness, unspecified: Secondary | ICD-10-CM | POA: Diagnosis not present

## 2022-03-26 DIAGNOSIS — E785 Hyperlipidemia, unspecified: Secondary | ICD-10-CM

## 2022-03-26 DIAGNOSIS — I10 Essential (primary) hypertension: Secondary | ICD-10-CM

## 2022-04-15 ENCOUNTER — Encounter: Payer: Self-pay | Admitting: Registered Nurse

## 2022-04-15 ENCOUNTER — Ambulatory Visit (INDEPENDENT_AMBULATORY_CARE_PROVIDER_SITE_OTHER): Payer: Medicare HMO | Admitting: Registered Nurse

## 2022-04-15 VITALS — BP 138/72 | HR 60 | Temp 98.0°F | Resp 18 | Ht 70.0 in | Wt 206.2 lb

## 2022-04-15 DIAGNOSIS — Z0000 Encounter for general adult medical examination without abnormal findings: Secondary | ICD-10-CM | POA: Diagnosis not present

## 2022-04-15 DIAGNOSIS — I10 Essential (primary) hypertension: Secondary | ICD-10-CM

## 2022-04-15 DIAGNOSIS — E782 Mixed hyperlipidemia: Secondary | ICD-10-CM

## 2022-04-15 DIAGNOSIS — Z125 Encounter for screening for malignant neoplasm of prostate: Secondary | ICD-10-CM | POA: Diagnosis not present

## 2022-04-15 LAB — COMPREHENSIVE METABOLIC PANEL
ALT: 12 U/L (ref 0–53)
AST: 16 U/L (ref 0–37)
Albumin: 4.6 g/dL (ref 3.5–5.2)
Alkaline Phosphatase: 81 U/L (ref 39–117)
BUN: 10 mg/dL (ref 6–23)
CO2: 27 mEq/L (ref 19–32)
Calcium: 9.4 mg/dL (ref 8.4–10.5)
Chloride: 99 mEq/L (ref 96–112)
Creatinine, Ser: 0.8 mg/dL (ref 0.40–1.50)
GFR: 90.56 mL/min (ref >60.00)
Glucose, Bld: 106 mg/dL — ABNORMAL HIGH (ref 70–99)
Potassium: 4.4 mEq/L (ref 3.5–5.1)
Sodium: 136 mEq/L (ref 135–145)
Total Bilirubin: 0.8 mg/dL (ref 0.2–1.2)
Total Protein: 6.7 g/dL (ref 6.0–8.3)

## 2022-04-15 LAB — URINALYSIS, ROUTINE W REFLEX MICROSCOPIC
Bilirubin Urine: NEGATIVE
Hgb urine dipstick: NEGATIVE
Ketones, ur: NEGATIVE
Nitrite: NEGATIVE
Specific Gravity, Urine: <=1.005 — AB (ref 1.000–1.030)
Total Protein, Urine: NEGATIVE
Urine Glucose: NEGATIVE
Urobilinogen, UA: 0.2 (ref 0.0–1.0)
pH: 6 (ref 5.0–8.0)

## 2022-04-15 LAB — CBC WITH DIFFERENTIAL/PLATELET
Basophils Absolute: 0.1 10*3/uL (ref 0.0–0.1)
Basophils Relative: 1.2 % (ref 0.0–3.0)
Eosinophils Absolute: 0.2 10*3/uL (ref 0.0–0.7)
Eosinophils Relative: 2.1 % (ref 0.0–5.0)
HCT: 50.3 % (ref 39.0–52.0)
Hemoglobin: 16.6 g/dL (ref 13.0–17.0)
Lymphocytes Relative: 25.7 % (ref 12.0–46.0)
Lymphs Abs: 2.3 10*3/uL (ref 0.7–4.0)
MCHC: 33 g/dL (ref 30.0–36.0)
MCV: 95.4 fl (ref 78.0–100.0)
Monocytes Absolute: 0.6 10*3/uL (ref 0.1–1.0)
Monocytes Relative: 7.1 % (ref 3.0–12.0)
Neutro Abs: 5.6 10*3/uL (ref 1.4–7.7)
Neutrophils Relative %: 63.9 % (ref 43.0–77.0)
Platelets: 263 10*3/uL (ref 150.0–400.0)
RBC: 5.27 Mil/uL (ref 4.22–5.81)
RDW: 14.2 % (ref 11.5–15.5)
WBC: 8.8 10*3/uL (ref 4.0–10.5)

## 2022-04-15 LAB — LIPID PANEL
Cholesterol: 163 mg/dL (ref 0–200)
HDL: 46.9 mg/dL (ref >39.00)
LDL Cholesterol: 96 mg/dL (ref 0–99)
NonHDL: 115.95
Total CHOL/HDL Ratio: 3
Triglycerides: 98 mg/dL (ref 0.0–149.0)
VLDL: 19.6 mg/dL (ref 0.0–40.0)

## 2022-04-15 LAB — TSH: TSH: 1.39 u[IU]/mL (ref 0.35–5.50)

## 2022-04-15 LAB — PSA, MEDICARE: PSA: 1.16 ng/ml (ref 0.10–4.00)

## 2022-04-15 LAB — HEMOGLOBIN A1C: Hgb A1c MFr Bld: 6.1 % (ref 4.6–6.5)

## 2022-04-15 NOTE — Progress Notes (Signed)
Complete physical exam  Patient: Cameron [name]   DOB: Jan 31, 1951   71 y.o. Male  MRN: [id number] Visit Date: 04/15/2022  Subjective:    Chief Complaint  Patient presents with   Annual Exam    Patient states he is here for a annual CPE    Cameron [name] is a 71 y.o. male who presents today for a complete physical exam. He reports consuming a general diet.     He generally feels well. He reports sleeping well. He does not have additional problems to discuss today.   Vision:Within the last year Dental:Within Last 6 months STD Screen:No PSA:No Most recent fall risk assessment:    04/15/2022   10:42 AM  Leonore in the past year? 0  Number falls in past yr: 0  Injury with Fall? 0  Risk for fall due to : No Fall Risks  Follow up Falls evaluation completed     Most recent depression screenings:    04/15/2022   10:42 AM 02/25/2022   10:14 AM  PHQ 2/9 Scores  PHQ - 2 Score 0 0  PHQ- 9 Score 0 0     Patient Active Problem List   Diagnosis Date Noted   Abnormal hemoglobin (Hgb) ([place]) 04/08/2020   Need for hepatitis C screening test 04/26/2018   Centrilobular emphysema ([place]) 04/20/2018   Polycythemia 04/20/2018   CAD (coronary artery disease) 03/31/2018   Visit for preventive health examination 02/27/2018   Overweight (BMI 25.0-29.9) 04/23/2014   Tobacco dependence 12/29/2012   Health maintenance examination 03/13/2012   Hyperlipemia, mixed 03/13/2012   HTN (hypertension), benign 02/11/2012   Past Medical History:  Diagnosis Date   Adenomatous colon polyp 05/2009; 11/2014   2010:Tubular adenoma, no high grade dysplasia.Marland Kitchen  + Hyperplastic polyps. 2016: Tubular adenoma-rpt 5 yrs.   Coronary artery disease    H/O chronic gastritis 05/2009   EGD: no h.pylori,dysplasia,or evidence of malignancy   Hyperlipidemia    lovaza from prior PMD-stopped due to cost   Hypertension    Obesity, Class I, BMI 30.0-34.9 (see actual BMI) 04/23/2014   Tobacco dependence    Past  Surgical History:  Procedure Laterality Date   APPENDECTOMY  1973   CHOLECYSTECTOMY N/A 07/06/2018   Procedure: LAPAROSCOPIC CHOLECYSTECTOMY WITH INTRAOPERATIVE CHOLANGIOGRAM;  Surgeon: Alphonsa Overall, MD;  Location: [facility];  Service: General;  Laterality: N/A;   COLONOSCOPY  2010   [facility]   EYE SURGERY     STRABISMUS SURGERY  age 50 or 9   Social History   Tobacco Use   Smoking status: Every Day    Packs/day: 1.00    Years: 40.00    Total pack years: 40.00    Types: Cigarettes   Smokeless tobacco: Never  Vaping Use   Vaping Use: Never used  Substance Use Topics   Alcohol use: Not Currently    Comment: rare   Drug use: No   Social History   Socioeconomic History   Marital status: Widowed    Spouse name: Not on file   Number of children: 2   Years of education: Not on file   Highest education level: Not on file  Occupational History   Occupation: Retired    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   Works with entertainment companies to set up venues    Orig from [place], has lived in [place] since 1970.   Tobacco 80 pack-yr hx, ongoing as of 03/2014.   Rare alcohol.  No drug use except DISTANT use of marijuana.   Social Determinants of Health   Financial Resource Strain: Low Risk  (04/15/2021)   Overall Financial Resource Strain (CARDIA)    Difficulty of Paying Living Expenses: Not hard at all  Food Insecurity: No Food Insecurity (04/15/2021)   Hunger Vital Sign    Worried About Running Out of Food in the Last Year: Never true    Ran Out of Food  in the Last Year: Never true  Transportation Needs: No Transportation Needs (04/15/2021)   PRAPARE - Hydrologist (Medical): No    Lack of Transportation (Non-Medical): No  Physical Activity: Insufficiently Active (04/15/2021)   Exercise Vital Sign    Days of Exercise per Week: 3 days    Minutes of Exercise per Session: 20 min  Stress: No Stress Concern Present (04/15/2021)   [place]    Feeling of Stress : Not at all  Social Connections: Not on file  Intimate Partner Violence: Not on file   Family Status  Relation Name Status   Mother  Deceased   Father  Deceased   MGF  Deceased   Daughter  Alive   PGM  Deceased   PGF  Deceased   Daughter  Alive   Family History  Problem Relation Age of Onset   Heart disease Mother    COPD Father    Hypertension Father    Hyperlipidemia Father    Diabetes Father    Heart attack Father    Dementia Father    Alcohol abuse Maternal Grandfather    No Known Allergies   Patient Care Team: Maximiano Coss, NP as PCP - General (Adult Health Nurse Practitioner) Druscilla Brownie, MD as Consulting Physician (Dermatology) Wonda Horner, MD as Consulting Physician (Gastroenterology)   Medications: Outpatient Medications Prior to Visit  Medication Sig   amoxicillin-clavulanate (AUGMENTIN) 875-125 MG tablet Take 1 tablet by mouth 2 (two) times daily.   aspirin 81 MG tablet Take 81 mg by mouth daily.   atorvastatin (LIPITOR) 20 MG tablet Take 1 tablet (20 mg total) by mouth daily.   benazepril (LOTENSIN) 40 MG tablet Take 1 tablet (40 mg total) by mouth daily.   benzonatate (TESSALON PERLES) 100 MG capsule Take 1 capsule (100 mg total) by mouth 3 (three) times daily as needed.   hydrochlorothiazide (MICROZIDE) 12.5 MG capsule Take 1 capsule (12.5 mg total) by mouth daily.   HYDROcodone-acetaminophen (NORCO/VICODIN) 5-325 MG tablet Take 1 tablet by mouth  every 4 (four) hours as needed.   predniSONE (STERAPRED UNI-PAK 21 TAB) 10 MG (21) TBPK tablet Take per package instructions. Do not skip doses. Finish entire supply.   sildenafil (VIAGRA) 50 MG tablet Take 0.5-2 tablets (25-100 mg total) by mouth daily as needed for erectile dysfunction.   No facility-administered medications prior to visit.    Review of Systems  Constitutional: Negative.  HENT: Negative.    Eyes: Negative.   Respiratory: Negative.    Cardiovascular: Negative.   Gastrointestinal: Negative.   Genitourinary: Negative.   Musculoskeletal: Negative.   Skin: Negative.   Neurological: Negative.   Psychiatric/Behavioral: Negative.    All other systems reviewed and are negative.   Last CBC Lab Results  Component Value Date   WBC 7.9 04/10/2021   HGB 17.8 (H) 04/10/2021   HCT 52.2 (H) 04/10/2021   MCV 94.0 04/10/2021   MCH 31.8 06/28/2018   RDW 14.5 04/10/2021   PLT 296.0 77/41/2878   Last metabolic panel Lab Results  Component Value Date   GLUCOSE 107 (H) 04/10/2021   NA 135 04/10/2021   K 4.4 04/10/2021   CL 96 04/10/2021   CO2 31 04/10/2021   BUN 7 04/10/2021   CREATININE 0.83 04/10/2021   GFRNONAA >60 06/28/2018   CALCIUM 9.3 04/10/2021   PROT 6.6 04/10/2021   ALBUMIN 4.3 04/10/2021   BILITOT 0.7 04/10/2021   ALKPHOS 108 04/10/2021   AST 15 04/10/2021   ALT 22 04/10/2021   ANIONGAP 8 06/28/2018   Last lipids Lab Results  Component Value Date   CHOL 152 04/10/2021   HDL 42.60 04/10/2021   LDLCALC 91 04/10/2021   LDLDIRECT 144.0 02/27/2018   TRIG 93.0 04/10/2021   CHOLHDL 4 04/10/2021   Last hemoglobin A1c Lab Results  Component Value Date   HGBA1C 6.4 04/10/2021   Last thyroid functions Lab Results  Component Value Date   TSH 1.82 04/16/2015   Last vitamin D No results found for: "25OHVITD2", "25OHVITD3", "VD25OH" Last vitamin B12 and Folate No results found for: "VITAMINB12", "FOLATE"      Objective:     BP 138/72   Pulse  60   Temp 98 F (36.7 C) (Temporal)   Resp 18   Ht '5\' 10"'$  (1.778 m)   Wt 206 lb 3.2 oz (93.5 kg)   SpO2 98%   BMI 29.59 kg/m   BP Readings from Last 3 Encounters:  04/15/22 138/72  02/25/22 (!) 144/76  12/28/21 (!) 131/95   Wt Readings from Last 3 Encounters:  04/15/22 206 lb 3.2 oz (93.5 kg)  02/25/22 207 lb 6.4 oz (94.1 kg)  11/13/21 211 lb (95.7 kg)   SpO2 Readings from Last 3 Encounters:  04/15/22 98%  02/25/22 99%  12/28/21 98%      Physical Exam Vitals and nursing note reviewed.  Constitutional:      General: He is not in acute distress.    Appearance: Normal appearance. He is not ill-appearing, toxic-appearing or diaphoretic.  HENT:     Head: Normocephalic and atraumatic.     Right Ear: Tympanic membrane, ear canal and external ear normal. There is no impacted cerumen.     Left Ear: Tympanic membrane, ear canal and external ear normal. There is no impacted cerumen.     Nose: Nose normal. No congestion or rhinorrhea.     Mouth/Throat:     Mouth: Mucous membranes are moist.     Pharynx: Oropharynx is clear. No oropharyngeal exudate or posterior oropharyngeal erythema.  Eyes:     General: No scleral icterus.       Right eye: No discharge.        Left eye: No discharge.     Extraocular Movements: Extraocular movements intact.     Conjunctiva/sclera: Conjunctivae normal.     Pupils: Pupils are equal, round, and reactive to light.  Neck:     Vascular: No carotid  bruit.  Cardiovascular:     Rate and Rhythm: Normal rate and regular rhythm.     Pulses: Normal pulses.     Heart sounds: Normal heart sounds. No murmur heard.    No friction rub. No gallop.  Pulmonary:     Effort: Pulmonary effort is normal. No respiratory distress.     Breath sounds: Normal breath sounds. No stridor. No wheezing, rhonchi or rales.  Chest:     Chest wall: No tenderness.  Abdominal:     General: Abdomen is flat. Bowel sounds are normal. There is no distension.     Palpations:  Abdomen is soft. There is no mass.     Tenderness: There is no abdominal tenderness. There is no right CVA tenderness, left CVA tenderness, guarding or rebound.     Hernia: No hernia is present.  Musculoskeletal:        General: No swelling, tenderness, deformity or signs of injury. Normal range of motion.     Cervical back: Normal range of motion and neck supple. No rigidity or tenderness.     Right lower leg: No edema.     Left lower leg: No edema.  Lymphadenopathy:     Cervical: No cervical adenopathy.  Skin:    General: Skin is warm and dry.     Capillary Refill: Capillary refill takes less than 2 seconds.     Coloration: Skin is not jaundiced or pale.     Findings: No bruising, erythema, lesion or rash.  Neurological:     General: No focal deficit present.     Mental Status: He is alert and oriented to person, place, and time. Mental status is at baseline.     Cranial Nerves: No cranial nerve deficit.     Sensory: No sensory deficit.     Motor: No weakness.     Coordination: Coordination normal.     Gait: Gait normal.     Deep Tendon Reflexes: Reflexes normal.  Psychiatric:        Mood and Affect: Mood normal.        Behavior: Behavior normal.        Thought Content: Thought content normal.        Judgment: Judgment normal.      No results found for any visits on 04/15/22.    Assessment & Plan:    Routine Health Maintenance and Physical Exam  Immunization History  Administered Date(s) Administered   Fluad Quad(high Dose 65+) 06/22/2019, 07/17/2020, 07/10/2021   Influenza,inj,Quad PF,6+ Mos 05/22/2018   PFIZER(Purple Top)SARS-COV-2 Vaccination 11/18/2019, 12/12/2019   Pneumococcal Conjugate-13 07/17/2020   Pneumococcal Polysaccharide-23 04/20/2018   Tdap 02/11/2012, 05/05/2020    Health Maintenance  Topic Date Due   COVID-19 Vaccine (3 - Pfizer series) 05/01/2022 (Originally 02/06/2020)   Zoster Vaccines- Shingrix (1 of 2) 05/28/2022 (Originally 03/12/2003)    COLONOSCOPY (Pts 45-48yr Insurance coverage will need to be confirmed)  04/16/2023 (Originally 12/06/2019)   INFLUENZA VACCINE  04/27/2022   TETANUS/TDAP  05/05/2030   Pneumonia Vaccine 69 Years old  Completed   Hepatitis C Screening  Completed   HPV VACCINES  Aged Out    Discussed health benefits of physical activity, and encouraged him to engage in regular exercise appropriate for his age and condition.  Problem List Items Addressed This Visit       Cardiovascular and Mediastinum   HTN (hypertension), benign   Relevant Orders   CBC with Differential/Platelet   Comprehensive metabolic panel   Hemoglobin A1c  TSH   Lipid panel   Urinalysis, Routine w reflex microscopic     Other   Hyperlipemia, mixed   Relevant Orders   CBC with Differential/Platelet   Comprehensive metabolic panel   Hemoglobin A1c   TSH   Lipid panel   Urinalysis, Routine w reflex microscopic   Other Visit Diagnoses     Annual physical exam    -  Primary   Screening PSA (prostate specific antigen)       Relevant Orders   PSA, Medicare ( [facility] only)      Return in about 1 year (around 04/16/2023) for CPE and labs.     PLAN Exam unremarkable Labs collected. Will follow up with the patient as warranted. Patient encouraged to call clinic with any questions, comments, or concerns.   Maximiano Coss, NP

## 2022-04-15 NOTE — Patient Instructions (Addendum)
Cameron [name] -   Doristine Devoid to see you  No concerns on exam. I'll let you know about labs  I recommend these providers: Berniece Pap, MD Dimas Chyle, MD Agustina Caroli, MD Myrna Blazer Early, NP Jeralyn Ruths, DNP  Thank you for letting me take part in your care,  [name]    If you have lab work done today you will be contacted with your lab results within the next 2 weeks.  If you have not heard from [place] then please contact us. The fastest way to get your results is to register for My Chart.   IF you received an x-ray today, you will receive an invoice from [facility]. Please contact [facility] at [phone] with questions or concerns regarding your invoice.   IF you received labwork today, you will receive an invoice from [facility]. Please contact [facility] at [phone] with questions or concerns regarding your invoice.   Our billing staff will not be able to assist you with questions regarding bills from these companies.  You will be contacted with the lab results as soon as they are available. The fastest way to get your results is to activate your My Chart account. Instructions are located on the last page of this paperwork. If you have not heard from [place] regarding the results in 2 weeks, please contact this office.

## 2022-04-27 ENCOUNTER — Telehealth: Payer: Self-pay | Admitting: Registered Nurse

## 2022-04-27 NOTE — Telephone Encounter (Signed)
Pt having trouble urinating and wants to talk to richard about some medications he should take.

## 2022-04-28 ENCOUNTER — Other Ambulatory Visit: Payer: Self-pay

## 2022-04-28 ENCOUNTER — Encounter: Payer: Self-pay | Admitting: Registered Nurse

## 2022-04-28 ENCOUNTER — Telehealth: Payer: Self-pay | Admitting: Registered Nurse

## 2022-04-28 ENCOUNTER — Encounter (HOSPITAL_COMMUNITY): Payer: Self-pay | Admitting: Emergency Medicine

## 2022-04-28 ENCOUNTER — Ambulatory Visit
Admission: RE | Admit: 2022-04-28 | Discharge: 2022-04-28 | Disposition: A | Discharge status: Home / Self Care | Payer: Medicare HMO | Source: Ambulatory Visit | Attending: Registered Nurse | Admitting: Registered Nurse

## 2022-04-28 ENCOUNTER — Emergency Department (HOSPITAL_COMMUNITY)
Admission: EM | Admit: 2022-04-28 | Discharge: 2022-04-28 | Disposition: A | Discharge status: Home / Self Care | Payer: Medicare HMO | Attending: Emergency Medicine | Admitting: Emergency Medicine

## 2022-04-28 ENCOUNTER — Ambulatory Visit (INDEPENDENT_AMBULATORY_CARE_PROVIDER_SITE_OTHER): Payer: Medicare HMO | Admitting: Registered Nurse

## 2022-04-28 ENCOUNTER — Other Ambulatory Visit: Payer: Self-pay | Admitting: Registered Nurse

## 2022-04-28 VITALS — BP 128/72 | HR 65 | Temp 98.0°F | Resp 18 | Ht 70.0 in | Wt 210.2 lb

## 2022-04-28 DIAGNOSIS — R3911 Hesitancy of micturition: Secondary | ICD-10-CM

## 2022-04-28 DIAGNOSIS — I10 Essential (primary) hypertension: Secondary | ICD-10-CM | POA: Diagnosis not present

## 2022-04-28 DIAGNOSIS — R339 Retention of urine, unspecified: Secondary | ICD-10-CM

## 2022-04-28 DIAGNOSIS — R159 Full incontinence of feces: Secondary | ICD-10-CM

## 2022-04-28 DIAGNOSIS — Z79899 Other long term (current) drug therapy: Secondary | ICD-10-CM | POA: Diagnosis not present

## 2022-04-28 DIAGNOSIS — Z7982 Long term (current) use of aspirin: Secondary | ICD-10-CM | POA: Insufficient documentation

## 2022-04-28 DIAGNOSIS — R140 Abdominal distension (gaseous): Secondary | ICD-10-CM | POA: Insufficient documentation

## 2022-04-28 DIAGNOSIS — K5900 Constipation, unspecified: Secondary | ICD-10-CM | POA: Diagnosis not present

## 2022-04-28 DIAGNOSIS — N135 Crossing vessel and stricture of ureter without hydronephrosis: Secondary | ICD-10-CM | POA: Diagnosis not present

## 2022-04-28 DIAGNOSIS — D72829 Elevated white blood cell count, unspecified: Secondary | ICD-10-CM | POA: Diagnosis not present

## 2022-04-28 DIAGNOSIS — R152 Fecal urgency: Secondary | ICD-10-CM

## 2022-04-28 DIAGNOSIS — N320 Bladder-neck obstruction: Secondary | ICD-10-CM

## 2022-04-28 LAB — URINALYSIS, ROUTINE W REFLEX MICROSCOPIC
Bilirubin Urine: NEGATIVE
Bilirubin Urine: NEGATIVE
Glucose, UA: NEGATIVE mg/dL
Hgb urine dipstick: NEGATIVE
Hgb urine dipstick: NEGATIVE
Ketones, ur: NEGATIVE
Ketones, ur: NEGATIVE mg/dL
Leukocytes,Ua: NEGATIVE
Nitrite: NEGATIVE
Nitrite: NEGATIVE
Protein, ur: NEGATIVE mg/dL
RBC / HPF: NONE SEEN (ref 0–?)
Specific Gravity, Urine: <=1.005 — AB (ref 1.000–1.030)
Specific Gravity, Urine: 1.028 (ref 1.005–1.030)
Total Protein, Urine: NEGATIVE
Urine Glucose: NEGATIVE
Urobilinogen, UA: 0.2 (ref 0.0–1.0)
pH: 6 (ref 5.0–8.0)
pH: 5 (ref 5.0–8.0)

## 2022-04-28 LAB — COMPREHENSIVE METABOLIC PANEL
ALT: 14 U/L (ref 0–44)
AST: 20 U/L (ref 15–41)
Albumin: 4 g/dL (ref 3.5–5.0)
Alkaline Phosphatase: 65 U/L (ref 38–126)
Anion gap: 9 (ref 5–15)
BUN: 8 mg/dL (ref 8–23)
CO2: 25 mmol/L (ref 22–32)
Calcium: 9.3 mg/dL (ref 8.9–10.3)
Chloride: 99 mmol/L (ref 98–111)
Creatinine, Ser: 0.86 mg/dL (ref 0.61–1.24)
GFR, Estimated: >60 mL/min (ref >60)
Glucose, Bld: 134 mg/dL — ABNORMAL HIGH (ref 70–99)
Potassium: 3.5 mmol/L (ref 3.5–5.1)
Sodium: 133 mmol/L — ABNORMAL LOW (ref 135–145)
Total Bilirubin: 0.9 mg/dL (ref 0.3–1.2)
Total Protein: 6.6 g/dL (ref 6.5–8.1)

## 2022-04-28 LAB — POCT URINALYSIS DIP (MANUAL ENTRY)
Bilirubin, UA: NEGATIVE
Blood, UA: NEGATIVE
Glucose, UA: NEGATIVE mg/dL
Ketones, POC UA: NEGATIVE mg/dL
Nitrite, UA: NEGATIVE
Spec Grav, UA: 1.01 (ref 1.010–1.025)
Urobilinogen, UA: 0.2 E.U./dL
pH, UA: 5 (ref 5.0–8.0)

## 2022-04-28 LAB — CBC WITH DIFFERENTIAL/PLATELET
Basophils Absolute: 0.1 10*3/uL (ref 0.0–0.1)
Basophils Relative: 0.7 % (ref 0.0–3.0)
Eosinophils Absolute: 0.1 10*3/uL (ref 0.0–0.7)
Eosinophils Relative: 1.2 % (ref 0.0–5.0)
HCT: 47.2 % (ref 39.0–52.0)
Hemoglobin: 15.9 g/dL (ref 13.0–17.0)
Lymphocytes Relative: 17.3 % (ref 12.0–46.0)
Lymphs Abs: 1.6 10*3/uL (ref 0.7–4.0)
MCHC: 33.7 g/dL (ref 30.0–36.0)
MCV: 96.1 fl (ref 78.0–100.0)
Monocytes Absolute: 0.8 10*3/uL (ref 0.1–1.0)
Monocytes Relative: 8.4 % (ref 3.0–12.0)
Neutro Abs: 6.7 10*3/uL (ref 1.4–7.7)
Neutrophils Relative %: 72.4 % (ref 43.0–77.0)
Platelets: 236 10*3/uL (ref 150.0–400.0)
RBC: 4.91 Mil/uL (ref 4.22–5.81)
RDW: 13.9 % (ref 11.5–15.5)
WBC: 9.3 10*3/uL (ref 4.0–10.5)

## 2022-04-28 LAB — PSA: PSA: 0.98 ng/mL (ref 0.10–4.00)

## 2022-04-28 LAB — CBC
HCT: 46.2 % (ref 39.0–52.0)
Hemoglobin: 16.4 g/dL (ref 13.0–17.0)
MCH: 32.7 pg (ref 26.0–34.0)
MCHC: 35.5 g/dL (ref 30.0–36.0)
MCV: 92 fL (ref 80.0–100.0)
Platelets: 256 K/uL (ref 150–400)
RBC: 5.02 MIL/uL (ref 4.22–5.81)
RDW: 13.4 % (ref 11.5–15.5)
WBC: 12.4 K/uL — ABNORMAL HIGH (ref 4.0–10.5)
nRBC: 0 % (ref 0.0–0.2)

## 2022-04-28 LAB — LIPASE, BLOOD: Lipase: 28 U/L (ref 11–51)

## 2022-04-28 MED ORDER — TAMSULOSIN HCL 0.4 MG PO CAPS: 0.4000 mg | ORAL_CAPSULE | Freq: Every day | ORAL | 3 refills | Status: DC

## 2022-04-28 MED ORDER — IOPAMIDOL (ISOVUE-370) INJECTION 76%
80.0000 mL | Freq: Once | INTRAVENOUS | Status: AC | PRN
  Administered 2022-04-28: 80 mL via INTRAVENOUS

## 2022-04-28 MED ORDER — CIPROFLOXACIN HCL 750 MG PO TABS: 750.0000 mg | ORAL_TABLET | Freq: Two times a day (BID) | ORAL | 0 refills | Status: DC

## 2022-04-28 NOTE — Telephone Encounter (Signed)
Information was discuss with the provider and patient. Patient refused hospital visit. He has an appointment at 1:10pm

## 2022-04-28 NOTE — ED Notes (Signed)
Received verbal report from Victoria G RN at this time 

## 2022-04-28 NOTE — Progress Notes (Signed)
Established Patient Office Visit  Subjective:  Patient ID: Cameron [name], male    DOB: [birth year]  Age: 71 y.o. MRN: [id number]  CC:  Chief Complaint  Patient presents with   Dysuria    Patient states he has been having trouble urinating and making bowel movements. Also unable to sleep     HPI Cameron [name] presents for dysuria  Trouble starting stream. Pain with urination Incomplete voiding.  Started over weekend.  Worsening Some malaise  Did have some fecal straining to pass stool One episode of fecal urgency with incontinence.  No appetite changes. No nvd. No fevers, chills fatigue, sweats, flank pain, hematuria  Outpatient Medications Prior to Visit  Medication Sig Dispense Refill   amoxicillin-clavulanate (AUGMENTIN) 875-125 MG tablet Take 1 tablet by mouth 2 (two) times daily. 20 tablet 0   aspirin 81 MG tablet Take 81 mg by mouth daily.     atorvastatin (LIPITOR) 20 MG tablet Take 1 tablet (20 mg total) by mouth daily. 90 tablet 0   benazepril (LOTENSIN) 40 MG tablet Take 1 tablet (40 mg total) by mouth daily. 90 tablet 0   benzonatate (TESSALON PERLES) 100 MG capsule Take 1 capsule (100 mg total) by mouth 3 (three) times daily as needed. 20 capsule 0   hydrochlorothiazide (MICROZIDE) 12.5 MG capsule Take 1 capsule (12.5 mg total) by mouth daily. 90 capsule 0   HYDROcodone-acetaminophen (NORCO/VICODIN) 5-325 MG tablet Take 1 tablet by mouth every 4 (four) hours as needed. 10 tablet 0   predniSONE (STERAPRED UNI-PAK 21 TAB) 10 MG (21) TBPK tablet Take per package instructions. Do not skip doses. Finish entire supply. 1 each 0   sildenafil (VIAGRA) 50 MG tablet Take 0.5-2 tablets (25-100 mg total) by mouth daily as needed for erectile dysfunction. 30 tablet 3   No facility-administered medications prior to visit.    Review of Systems  Constitutional: Negative.   HENT: Negative.    Eyes: Negative.   Respiratory: Negative.    Cardiovascular: Negative.    Gastrointestinal: Negative.   Genitourinary: Negative.   Musculoskeletal: Negative.   Skin: Negative.   Neurological: Negative.   Psychiatric/Behavioral: Negative.    All other systems reviewed and are negative.     Objective:     BP 128/72   Pulse 65   Temp 98 F (36.7 C) (Temporal)   Resp 18   Ht '5\' 10"'$  (1.778 m)   Wt 210 lb 3.2 oz (95.3 kg)   SpO2 98%   BMI 30.16 kg/m   Wt Readings from Last 3 Encounters:  04/28/22 210 lb 3.2 oz (95.3 kg)  04/15/22 206 lb 3.2 oz (93.5 kg)  02/25/22 207 lb 6.4 oz (94.1 kg)   Physical Exam Constitutional:      General: He is not in acute distress.    Appearance: Normal appearance. He is normal weight. He is not ill-appearing, toxic-appearing or diaphoretic.  Cardiovascular:     Rate and Rhythm: Normal rate and regular rhythm.     Heart sounds: Normal heart sounds. No murmur heard.    No friction rub. No gallop.  Pulmonary:     Effort: Pulmonary effort is normal. No respiratory distress.     Breath sounds: Normal breath sounds. No stridor. No wheezing, rhonchi or rales.  Chest:     Chest wall: No tenderness.  Genitourinary:    Comments: Declined by patient Neurological:     General: No focal deficit present.     Mental Status: He is  alert and oriented to person, place, and time. Mental status is at baseline.  Psychiatric:        Mood and Affect: Mood normal.        Behavior: Behavior normal.        Thought Content: Thought content normal.        Judgment: Judgment normal.     Results for orders placed or performed in visit on 04/28/22  POCT urinalysis dipstick  Result Value Ref Range   Color, UA yellow yellow   Clarity, UA clear clear   Glucose, UA negative negative mg/dL   Bilirubin, UA negative negative   Ketones, POC UA negative negative mg/dL   Spec Grav, UA 1.010 1.010 - 1.025   Blood, UA negative negative   pH, UA 5.0 5.0 - 8.0   Protein Ur, POC trace (A) negative mg/dL   Urobilinogen, UA 0.2 0.2 or 1.0  E.U./dL   Nitrite, UA Negative Negative   Leukocytes, UA Small (1+) (A) Negative      The 10-year ASCVD risk score (Arnett DK, et al., 2019) is: 22.3%    Assessment & Plan:   Problem List Items Addressed This Visit   None Visit Diagnoses     Urinary hesitancy    -  Primary   Relevant Medications   ciprofloxacin (CIPRO) 750 MG tablet   tamsulosin (FLOMAX) 0.4 MG CAPS capsule   Other Relevant Orders   POCT urinalysis dipstick (Completed)   Urinalysis, Routine w reflex microscopic   Urine Culture   PSA   CBC with Differential/Platelet   CT Abdomen Pelvis W Contrast   Incomplete emptying of bladder       Relevant Medications   ciprofloxacin (CIPRO) 750 MG tablet   tamsulosin (FLOMAX) 0.4 MG CAPS capsule   Other Relevant Orders   POCT urinalysis dipstick (Completed)   Urinalysis, Routine w reflex microscopic   Urine Culture   PSA   CBC with Differential/Platelet   CT Abdomen Pelvis W Contrast   Incontinence of feces with fecal urgency       Relevant Orders   Urinalysis, Routine w reflex microscopic   Urine Culture   PSA   CBC with Differential/Platelet   CT Abdomen Pelvis W Contrast       Meds ordered this encounter  Medications   ciprofloxacin (CIPRO) 750 MG tablet    Sig: Take 1 tablet (750 mg total) by mouth 2 (two) times daily.    Dispense:  56 tablet    Refill:  0    Order Specific Question:   Supervising Provider    Answer:   Carlota Raspberry, JEFFREY R [[id number]]   tamsulosin (FLOMAX) 0.4 MG CAPS capsule    Sig: Take 1 capsule (0.4 mg total) by mouth daily.    Dispense:  30 capsule    Refill:  3    Order Specific Question:   Supervising Provider    Answer:   Carlota Raspberry, JEFFREY R [[id number]]    Return if symptoms worsen or fail to improve.   PLAN Unclear etiology. Perhaps acute prostatitis, will treat as such with cipro and flomax Will check psa, cbc, urinalysis and culture. Obtain CT abd pel w contrast to rule out more serious pathology Patient encouraged to call  clinic with any questions, comments, or concerns.   Maximiano Coss, NP

## 2022-04-28 NOTE — Telephone Encounter (Signed)
Meds have been sent in

## 2022-04-28 NOTE — Telephone Encounter (Signed)
Spoke with pt regarding CT scan Large bladder diverticulum nearly the size of the bladder itself. This does impinge on his ureter and has contributed to a level of hydronephrosis  Given his malaise, I have advised patient to proceed to ED for more acute labs and work up. I expressed my concern for hydronephrosis and urinary retention.  He voiced understanding, as did spouse, and plans to proceed to [facility] ED.  Kathrin Ruddy, NP

## 2022-04-28 NOTE — ED Provider Notes (Signed)
[facility] EMERGENCY DEPARTMENT Provider Note   CSN: [id number] Arrival date & time: 04/28/22  1716     History  Chief Complaint  Patient presents with  . Urinary Retention    Cameron [name] is a 71 y.o. male who presents to the emergency department with concerns for urinary retention onset 4-5 days.  Patient notes that he has had to strain and has had dribbling for the past 4-5 days.  Notes he was able to give a urine sample today.  Denies having a urologist.  Was evaluated by his primary care provider and had a CT scan done at that time and then was told to come to the emergency department for further evaluation of his symptoms.  No meds tried at this time.  Denies past medical history of BPH.  Also notes associated constipation.  Was started on Flomax today by his provider.  Denies any new additional medications prior to the onset of his symptoms.  Denies fever, hematuria, abdominal pain, nausea, vomiting.  The history is provided by the patient and the spouse. No language interpreter was used.       Home Medications Prior to Admission medications   Medication Sig Start Date End Date Taking? Authorizing Provider  amoxicillin-clavulanate (AUGMENTIN) 875-125 MG tablet Take 1 tablet by mouth 2 (two) times daily. 02/25/22   Maximiano Coss, NP  aspirin 81 MG tablet Take 81 mg by mouth daily.    [provider]  atorvastatin (LIPITOR) 20 MG tablet Take 1 tablet (20 mg total) by mouth daily. 03/04/22   Maximiano Coss, NP  benazepril (LOTENSIN) 40 MG tablet Take 1 tablet (40 mg total) by mouth daily. 03/04/22   Maximiano Coss, NP  benzonatate (TESSALON PERLES) 100 MG capsule Take 1 capsule (100 mg total) by mouth 3 (three) times daily as needed. 03/31/21   Lucretia Kern, DO  ciprofloxacin (CIPRO) 750 MG tablet Take 1 tablet (750 mg total) by mouth 2 (two) times daily. 04/28/22   Maximiano Coss, NP  hydrochlorothiazide (MICROZIDE) 12.5 MG capsule Take 1 capsule (12.5 mg  total) by mouth daily. 03/04/22   Maximiano Coss, NP  HYDROcodone-acetaminophen (NORCO/VICODIN) 5-325 MG tablet Take 1 tablet by mouth every 4 (four) hours as needed. 12/28/21   Tacy Learn, PA-C  predniSONE (STERAPRED UNI-PAK 21 TAB) 10 MG (21) TBPK tablet Take per package instructions. Do not skip doses. Finish entire supply. 02/25/22   Maximiano Coss, NP  sildenafil (VIAGRA) 50 MG tablet Take 0.5-2 tablets (25-100 mg total) by mouth daily as needed for erectile dysfunction. 11/13/21   Maximiano Coss, NP  tamsulosin (FLOMAX) 0.4 MG CAPS capsule Take 1 capsule (0.4 mg total) by mouth daily. 04/28/22   Maximiano Coss, NP      Allergies    Patient has no known allergies.    Review of Systems   Review of Systems  Constitutional:  Negative for fever.  Gastrointestinal:  Negative for abdominal pain, nausea and vomiting.  Genitourinary:  Negative for hematuria.  All other systems reviewed and are negative.   Physical Exam Updated Vital Signs BP (!) 149/92 (BP Location: Right Arm)   Pulse 87   Temp 97.9 F (36.6 C) (Oral)   Resp 16   SpO2 94%  Physical Exam Vitals and nursing note reviewed.  Constitutional:      General: He is not in acute distress.    Appearance: He is not diaphoretic.  HENT:     Head: Normocephalic and atraumatic.  Mouth/Throat:     Pharynx: No oropharyngeal exudate.  Eyes:     General: No scleral icterus.    Conjunctiva/sclera: Conjunctivae normal.  Cardiovascular:     Rate and Rhythm: Normal rate and regular rhythm.     Pulses: Normal pulses.     Heart sounds: Normal heart sounds.  Pulmonary:     Effort: Pulmonary effort is normal. No respiratory distress.     Breath sounds: Normal breath sounds. No wheezing.  Abdominal:     General: Bowel sounds are normal. There is distension.     Palpations: Abdomen is soft. There is no mass.     Tenderness: There is no abdominal tenderness. There is no guarding or rebound.     Comments: Distention noted to abdomen  without tenderness to palpation.  Musculoskeletal:        General: Normal range of motion.     Cervical back: Normal range of motion and neck supple.  Skin:    General: Skin is warm and dry.  Neurological:     Mental Status: He is alert.  Psychiatric:        Behavior: Behavior normal.     ED Results / Procedures / Treatments   Labs (all labs ordered are listed, but only abnormal results are displayed) Labs Reviewed  COMPREHENSIVE METABOLIC PANEL - Abnormal; Notable for the following components:      Result Value   Sodium 133 (*)    Glucose, Bld 134 (*)    All other components within normal limits  CBC - Abnormal; Notable for the following components:   WBC 12.4 (*)    All other components within normal limits  URINALYSIS, ROUTINE W REFLEX MICROSCOPIC - Abnormal; Notable for the following components:   Leukocytes,Ua TRACE (*)    Bacteria, UA RARE (*)    All other components within normal limits  LIPASE, BLOOD    EKG None  Radiology CT Abdomen Pelvis W Contrast  Result Date: 04/28/2022 CLINICAL DATA:  New bowel incontinence. Urinary hesitancy. Incomplete bladder emptying incontinence of feces EXAM: CT ABDOMEN AND PELVIS WITH CONTRAST TECHNIQUE: Multidetector CT imaging of the abdomen and pelvis was performed using the standard protocol following bolus administration of intravenous contrast. RADIATION DOSE REDUCTION: This exam was performed according to the departmental dose-optimization program which includes automated exposure control, adjustment of the mA and/or kV according to patient size and/or use of iterative reconstruction technique. CONTRAST:  53m ISOVUE-370 IOPAMIDOL (ISOVUE-370) INJECTION 76% COMPARISON:  None Available. FINDINGS: Lower chest: Lung bases are clear. Hepatobiliary: No focal hepatic lesion. Postcholecystectomy. No biliary dilatation. Pancreas: Pancreas is normal. No ductal dilatation. No pancreatic inflammation. Spleen: Normal spleen Adrenals/urinary  tract: Adrenal glands are normal. There is hydronephrosis and hydroureter of the RIGHT kidney. LEFT kidney appears normal. There is a large bladder diverticulum posterior RIGHT of the bladder. The diverticulum measures equally as large as the bladder itself. The bladder is distended. Bladder diverticulum measures 17.3 x 8.7 by 13 cm (volume = 1000 cm^3). Bladder measures 13.7 by 9.2 by 16.6 cm (volume = 1100 cm^3). The bladder is elevated anteriorly to the LEFT by the very large bladder diverticulum. The large posterior RIGHT bladder diverticulum appears to obstruct the RIGHT ureter. Prostate grossly normal. Stomach/Bowel: Stomach, small bowel cecum normal. Appendix not identified. The colon and rectosigmoid colon are normal. Vascular/Lymphatic: Abdominal aorta is normal caliber with atherosclerotic calcification. There is no retroperitoneal or periportal lymphadenopathy. No pelvic lymphadenopathy. Fusiform dilatation of the abdominal aorta 35 mm. Reproductive: Prostate unremarkable  Other: No free fluid. Musculoskeletal: No aggressive osseous lesion. IMPRESSION: 1. Dominant finding is distended bladder elevated anterior to the LEFT by equally large bladder diverticulum. Findings suggest bladder outlet obstruction. 2. Obstruction of the RIGHT ureter by the unusually bladder large diverticulum. 3.  LEFT kidney appears normal. 4. Recommend urology consultation. These results will be called to the ordering clinician or representative by the Radiologist Assistant, and communication documented in the PACS or [employer]. Electronically Signed   By: Suzy Bouchard M.D.   On: 04/28/2022 14:18    Procedures Procedures    Medications Ordered in ED Medications - No data to display  ED Course/ Medical Decision Making/ A&P Clinical Course as of 04/28/22 2106  Wed Apr 28, 2022  1831 Consult with Urologist by triage PA-C, Sherrell Puller, urologist, Dr. Alinda Money recommends catheter being placed in the emergency  department.  Also recommends phone call to notify Dr. Alinda Money of patient's kidney function as well as urine output. [SB]  1907 Notified by RN that patient had 250 ml urine output with foley cath insertion. [SB]  1941 Consult with Dr. Alinda Money and can be discharged home with foley in place and follow up to the office. [SB]  3348 71 year old male sent in by his PCP after having an outpatient CAT scan showing urinary retention and large bladder.  Patient's labs showing normal renal function.  Foley placed with good improvement in his discomfort and now draining urine.  Reviewed with urology.  Plan is follow-up on urinalysis and outpatient urology follow-up. [MB]  1956 Discussed with patient regarding labs as well as recommendations as per urology.  Answered all of her questions.  Patient notified that we are waiting at this time for urinalysis prior to discharge. [SB]    Clinical Course User Index [MB] Hayden Rasmussen, MD [SB] Ivor Kishi A, PA-C                           Medical Decision Making Amount and/or Complexity of Data Reviewed Labs: ordered.   Pt presents with concerns for urinary retention onset 4-5 days.  Denies history of BPH.  No new medications prior to the onset of his symptoms.  Was started on Flomax by his PCP today.  Was told to come to the ED for further evaluation due to a CT scan done in outpatient setting.  Notes he has been able to dribble and strain.  Patient afebrile.  On exam, pt with abdominal distention without tenderness to palpation. No acute cardiovascular, respiratory exam findings. Differential diagnosis includes obstruction, kidney stone, BPH, malignancy.    Co morbidities that complicate the patient evaluation: Hypertension  Additional history obtained:  Additional history obtained from Spouse/Significant Other External records from outside source obtained and reviewed including: CT abdomen pelvis completed 04/28/2022 in the outpatient setting noted: 1.  Dominant finding is distended bladder elevated anterior to the LEFT by equally large bladder diverticulum. Findings suggest bladder outlet obstruction. 2. Obstruction of the RIGHT ureter by the unusually bladder large diverticulum. 3.  LEFT kidney appears normal.  Labs:  I ordered, and personally interpreted labs.  The pertinent results include:   Lipase at 28 unremarkable. CMP with slightly decreased sodium at 133, slightly elevated glucose at 134 otherwise unremarkable.  CBC with slightly elevated WBC at 12.4 otherwise unremarkable. Urinalysis unremarkable in the ED at this time   Consultations: I requested consultation with the Urologist, and discussed lab and imaging findings as well as pertinent  plan - they recommend: Foley catheter in the emergency department as well as outpatient follow-up in the clinic for further discussion.   Disposition: Presentation suspicious for urinary retention likely in the setting of bladder outlet obstruction syndrome.  Doubt BPH at this time, no acute findings noted on CT scan with prostate.  Doubt malignancy as cause of obstruction at this time.  Doubt nephrolithiasis. After consideration of the diagnostic results and the patients response to treatment, I feel that the patient would benefit from Discharge home.  Patient instructed to follow-up with urologist regarding today's ED visit.  Supportive care measures and strict return precautions discussed with patient at bedside. Pt acknowledges and verbalizes understanding. Pt appears safe for discharge. Follow up as indicated in discharge paperwork.    This chart was dictated using voice recognition software, Dragon. Despite the best efforts of this provider to proofread and correct errors, errors may still occur which can change documentation meaning.  Final Clinical Impression(s) / ED Diagnoses Final diagnoses:  Urinary retention    Rx / DC Orders ED Discharge Orders     None         Lilybeth Vien,  Aletheia Tangredi A, PA-C 04/28/22 2106    Hayden Rasmussen, MD 04/29/22 1616

## 2022-04-28 NOTE — ED Notes (Signed)
Foley bag converted to leg bag at this time

## 2022-04-28 NOTE — Patient Instructions (Addendum)
[name] -   Cameron [name] to see you  Start cipro twice daily Flomax once daily  I will let you know how labs look  Cameron [name] or I will reach out in regards to imaging  Stay well,  Cameron [name]     If you have lab work done today you will be contacted with your lab results within the next 2 weeks.  If you have not heard from [place] then please contact us. The fastest way to get your results is to register for My Chart.   IF you received an x-ray today, you will receive an invoice from [facility]. Please contact [facility] at [phone] with questions or concerns regarding your invoice.   IF you received labwork today, you will receive an invoice from [facility]. Please contact [facility] at [phone] with questions or concerns regarding your invoice.   Our billing staff will not be able to assist you with questions regarding bills from these companies.  You will be contacted with the lab results as soon as they are available. The fastest way to get your results is to activate your My Chart account. Instructions are located on the last page of this paperwork. If you have not heard from [place] regarding the results in 2 weeks, please contact this office.

## 2022-04-28 NOTE — Addendum Note (Signed)
Addended by: Maximiano Coss on: 04/28/2022 02:14 PM   Modules accepted: Orders

## 2022-04-28 NOTE — ED Triage Notes (Signed)
Patient states he has not been able to urinate nor move his bowels for hte last four days. Had a CT scan today at [facility] and was told he has a large mass blocking his ureters. Patient denies pain, is in no apparent distress.

## 2022-04-28 NOTE — Telephone Encounter (Signed)
Pt called in stating that his medication where sent to the incorrect pharmacy meds need to go to [facility] on [address]   Please advise they are on the way to the pharmacy now

## 2022-04-28 NOTE — ED Notes (Signed)
ED provider at bedside.

## 2022-04-28 NOTE — ED Provider Triage Note (Signed)
Emergency Medicine Provider Triage Evaluation Note  Cameron [name] , a 71 y.o. male  was evaluated in triage.  Pt complains of urinary retention and constipation with past 4 days.  Patient was seen at his primary care office for this.  Did a CT abdomen which shows possible bladder outlet obstruction.  He was sent to the emergency department.  Patient reports that he is only had some urinary dribbling for the past 4 days..  Review of Systems  Positive:  Negative:   Physical Exam  BP (!) 149/92 (BP Location: Right Arm)   Pulse 87   Temp 97.9 F (36.6 C) (Oral)   Resp 16   SpO2 94%  Gen:   Awake, no distress   Resp:  Normal effort  MSK:   Moves extremities without difficulty  Other:    Medical Decision Making  Medically screening exam initiated at 6:32 PM.  Appropriate orders placed.  Cameron [name] was informed that the remainder of the evaluation will be completed by another provider, this initial triage assessment does not replace that evaluation, and the importance of remaining in the ED until their evaluation is complete.  Reviewed CT imaging of reading.  I called Dr. Alinda Money with urology who recommended doing a 16 or 40 French catheter and monitoring urine output as well as checking back on kidney functions.  I relayed this to [facility] who picked up the patient and she will call him for further recommendations   Sherrell Puller, Hershal Coria 04/28/22 6286

## 2022-04-28 NOTE — Discharge Instructions (Signed)
It was a pleasure taking care of you today!   Your labs and urine today overall are unremarkable.  Attached is information for the on-call urologist, call tomorrow and set up a follow-up appointment regarding today's ED visit.  At that time there will be further discussion on the next steps.  Keep the Foley catheter in place until you are evaluated by urology.  You may follow-up with your primary care provider as needed.  Return to the emergency department experience increasing/worsening abdominal pain, back pain, vomiting, abdominal pain, worsening symptoms.

## 2022-04-29 ENCOUNTER — Encounter: Payer: Self-pay | Admitting: Registered Nurse

## 2022-04-29 ENCOUNTER — Telehealth: Payer: Self-pay | Admitting: Registered Nurse

## 2022-04-29 LAB — URINE CULTURE
MICRO NUMBER:: 13726647
Result:: NO GROWTH
SPECIMEN QUALITY:: ADEQUATE

## 2022-04-29 NOTE — Telephone Encounter (Signed)
Missed him earlier - have sent a MyChart message to patient  Thanks,  Denice Paradise

## 2022-04-29 NOTE — Telephone Encounter (Signed)
Pt would like you to call him, He states he just wants to say Thank you

## 2022-05-04 ENCOUNTER — Ambulatory Visit (INDEPENDENT_AMBULATORY_CARE_PROVIDER_SITE_OTHER): Payer: Medicare HMO | Admitting: Nurse Practitioner

## 2022-05-04 ENCOUNTER — Encounter: Payer: Self-pay | Admitting: Nurse Practitioner

## 2022-05-04 VITALS — BP 118/66 | HR 95 | Temp 97.6°F | Resp 12 | Ht 70.0 in | Wt 202.0 lb

## 2022-05-04 DIAGNOSIS — J432 Centrilobular emphysema: Secondary | ICD-10-CM | POA: Diagnosis not present

## 2022-05-04 DIAGNOSIS — E782 Mixed hyperlipidemia: Secondary | ICD-10-CM

## 2022-05-04 DIAGNOSIS — F17200 Nicotine dependence, unspecified, uncomplicated: Secondary | ICD-10-CM

## 2022-05-04 DIAGNOSIS — Z122 Encounter for screening for malignant neoplasm of respiratory organs: Secondary | ICD-10-CM

## 2022-05-04 DIAGNOSIS — I10 Essential (primary) hypertension: Secondary | ICD-10-CM

## 2022-05-04 DIAGNOSIS — D751 Secondary polycythemia: Secondary | ICD-10-CM

## 2022-05-04 DIAGNOSIS — R69 Illness, unspecified: Secondary | ICD-10-CM | POA: Diagnosis not present

## 2022-05-04 DIAGNOSIS — Z0000 Encounter for general adult medical examination without abnormal findings: Secondary | ICD-10-CM | POA: Insufficient documentation

## 2022-05-04 NOTE — Assessment & Plan Note (Signed)
Noted on low-dose CT scan.  Patient denies any shortness of breath at rest or with exertion.  No inhalers at current time

## 2022-05-04 NOTE — Progress Notes (Signed)
New Patient Office Visit  Subjective    Patient ID: Cameron [name], male    DOB: May 05, 1951  Age: 71 y.o. MRN: [id number]  CC:  Chief Complaint  Patient presents with   Transfer of Care    From Maximiano Coss   ER follow up    Has appointment with urologist/PA on 05/12/22 with [facility]. He has catheter in but not sure if it is placed correctly as patient not able to sit down has to stand or lean back.    HPI Cameron [name] presents to establish care   CAD/HTN: Has a cuff but does not check. DOes tolerate the meds well   Emphysema: no inhalers. No shortness of breath.  Incidental finding on low-dose CT scan in 2020.  Polycythemia: Likely secondary to tobacco use  Tobacco use: states that he has been smoking for 40 years. Has not tried to stop smoking. Has tried chantix.  Patient had very vibrant dreams and discontinued medication  Hosptial follow up: he saw his PCP and they ordered  CT AP that showed distentoin and bladder diverticulium.  Patient was informed to go to the emergency department.  They did insert a catheter and removed a large amount of urine.  At this point he is able to have a bowel movement thereafter he has been referred to urology for outpatient follow-up catheter still intact in office visit today  Outpatient Encounter Medications as of 05/04/2022  Medication Sig   aspirin 81 MG tablet Take 81 mg by mouth daily.   atorvastatin (LIPITOR) 20 MG tablet Take 1 tablet (20 mg total) by mouth daily.   benazepril (LOTENSIN) 40 MG tablet Take 1 tablet (40 mg total) by mouth daily.   hydrochlorothiazide (MICROZIDE) 12.5 MG capsule Take 1 capsule (12.5 mg total) by mouth daily.   sildenafil (VIAGRA) 50 MG tablet Take 0.5-2 tablets (25-100 mg total) by mouth daily as needed for erectile dysfunction.   tamsulosin (FLOMAX) 0.4 MG CAPS capsule Take 1 capsule (0.4 mg total) by mouth daily.   [DISCONTINUED] amoxicillin-clavulanate (AUGMENTIN) 875-125 MG tablet Take 1 tablet  by mouth 2 (two) times daily.   [DISCONTINUED] benzonatate (TESSALON PERLES) 100 MG capsule Take 1 capsule (100 mg total) by mouth 3 (three) times daily as needed.   [DISCONTINUED] ciprofloxacin (CIPRO) 750 MG tablet Take 1 tablet (750 mg total) by mouth 2 (two) times daily.   [DISCONTINUED] HYDROcodone-acetaminophen (NORCO/VICODIN) 5-325 MG tablet Take 1 tablet by mouth every 4 (four) hours as needed.   [DISCONTINUED] predniSONE (STERAPRED UNI-PAK 21 TAB) 10 MG (21) TBPK tablet Take per package instructions. Do not skip doses. Finish entire supply.   No facility-administered encounter medications on file as of 05/04/2022.    Past Medical History:  Diagnosis Date   Adenomatous colon polyp 05/2009; 11/2014   2010:Tubular adenoma, no high grade dysplasia.[facility]  + Hyperplastic polyps. 2016: Tubular adenoma-rpt 5 yrs.   Coronary artery disease    H/O chronic gastritis 05/2009   EGD: no h.pylori,dysplasia,or evidence of malignancy   Hyperlipidemia    lovaza from prior PMD-stopped due to cost   Hypertension    Obesity, Class I, BMI 30.0-34.9 (see actual BMI) 04/23/2014   Tobacco dependence     Past Surgical History:  Procedure Laterality Date   APPENDECTOMY  1973   CHOLECYSTECTOMY N/A 07/06/2018   Procedure: LAPAROSCOPIC CHOLECYSTECTOMY WITH INTRAOPERATIVE CHOLANGIOGRAM;  Surgeon: Alphonsa Overall, MD;  Location: [facility];  Service: General;  Laterality: N/A;   COLONOSCOPY  2010  [facility]   EYE SURGERY     STRABISMUS SURGERY  age 89 or 81    Family History  Problem Relation Age of Onset   Diabetes Mother    Heart disease Mother    COPD Father    Hypertension Father    Hyperlipidemia Father    Diabetes Father    Heart attack Father    Dementia Father    Alcohol abuse Maternal Grandfather     Social History   Socioeconomic History   Marital status: Widowed    Spouse name: Not on file   Number of children: 2   Years of education: Not on file   Highest education level: Not on file   Occupational History   Occupation: Retired    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   Works with entertainment companies to set up venues    Orig from [place], has lived in [place] since 1970.   Tobacco 80 pack-yr hx, ongoing as of 03/2014.   Rare alcohol.  No drug use except DISTANT use of marijuana.   Social Determinants of Health   Financial Resource Strain: Low Risk  (04/15/2021)   Overall Financial Resource Strain (CARDIA)    Difficulty of Paying Living Expenses: Not hard at all  Food Insecurity: No Food Insecurity (04/15/2021)   Hunger Vital Sign    Worried About Running Out of Food in the Last Year: Never true    Ran Out of Food in the Last Year: Never true  Transportation Needs: No Transportation Needs (04/15/2021)   PRAPARE - Hydrologist (Medical): No    Lack of Transportation (Non-Medical): No  Physical Activity: Insufficiently Active (04/15/2021)   Exercise Vital Sign    Days of Exercise per Week: 3 days    Minutes of Exercise per Session: 20 min  Stress: No Stress Concern Present (04/15/2021)   [place]    Feeling of Stress : Not at all  Social Connections: Not on file  Intimate Partner Violence: Not on file    Review of Systems  Constitutional:  Negative for chills and fever.  Gastrointestinal:  Negative for abdominal pain, nausea and vomiting.  Genitourinary:  Negative for dysuria and frequency.       Catheter   Neurological:  Positive for  tingling (left hand with fingers. With workers comp). Negative for dizziness and headaches.  Psychiatric/Behavioral:  Negative for hallucinations and suicidal ideas.         Objective    BP 118/66   Pulse 95   Temp 97.6 F (36.4 C)   Resp 12   Ht '5\' 10"'$  (1.778 m)   Wt 202 lb (91.6 kg)   SpO2 99%   BMI 28.98 kg/m   Physical Exam Vitals and nursing note reviewed.  Constitutional:      Appearance: Normal appearance.  Cardiovascular:     Rate and Rhythm: Normal  rate and regular rhythm.     Heart sounds: Normal heart sounds.  Pulmonary:     Effort: Pulmonary effort is normal.     Breath sounds: Normal breath sounds.  Abdominal:     General: Bowel sounds are normal. There is no distension.     Palpations: There is no mass.     Tenderness: There is no abdominal tenderness.     Hernia: No hernia is present.  Genitourinary:    Penis: Normal.      Comments: Urinary catheter in place in office  Musculoskeletal:     Right lower leg: No edema.     Left lower leg: No edema.  Neurological:     Mental Status: He is alert.         Assessment & Plan:   Problem List Items Addressed This Visit       Cardiovascular and Mediastinum   HTN (hypertension), benign    Patient currently maintained on benazepril and hydrochlorothiazide.  Taking medications prescribed denies adverse drug events does not check blood pressure at home blood pressure within normal limit office today.        Respiratory   Centrilobular emphysema ([place])    Noted on low-dose CT scan.  Patient denies any shortness of breath at rest or with exertion.  No inhalers at current time        Other   Hyperlipemia, mixed    Patient currently maintained on atorvastatin 20 mg.  Continue take medication as prescribed      Tobacco dependence   Polycythemia - Primary    Likely secondary to tobacco use.  Patient recently had labs drawn      Screening for lung cancer   Relevant Orders   Ambulatory Referral  Lung Cancer Screening [facility]    Return in about 6 months (around 11/04/2022) for Recheck of chronic conditions last CPE was 04/15/2022.   Romilda Garret, NP

## 2022-05-04 NOTE — Patient Instructions (Signed)
Nice to see you today I want to see you in 6 months for a recheck, sooner if you need me Keep the appointment with the urology clinic on the 16th

## 2022-05-04 NOTE — Assessment & Plan Note (Signed)
Likely secondary to tobacco use.  Patient recently had labs drawn

## 2022-05-04 NOTE — Assessment & Plan Note (Signed)
Patient currently maintained on atorvastatin 20 mg.  Continue take medication as prescribed

## 2022-05-04 NOTE — Assessment & Plan Note (Signed)
Patient currently maintained on benazepril and hydrochlorothiazide.  Taking medications prescribed denies adverse drug events does not check blood pressure at home blood pressure within normal limit office today.

## 2022-05-12 DIAGNOSIS — R338 Other retention of urine: Secondary | ICD-10-CM | POA: Diagnosis not present

## 2022-05-12 DIAGNOSIS — N323 Diverticulum of bladder: Secondary | ICD-10-CM | POA: Diagnosis not present

## 2022-05-12 DIAGNOSIS — N130 Hydronephrosis with ureteropelvic junction obstruction: Secondary | ICD-10-CM | POA: Diagnosis not present

## 2022-05-15 ENCOUNTER — Other Ambulatory Visit: Payer: Self-pay

## 2022-05-15 DIAGNOSIS — Z79899 Other long term (current) drug therapy: Secondary | ICD-10-CM | POA: Diagnosis not present

## 2022-05-15 DIAGNOSIS — Z7982 Long term (current) use of aspirin: Secondary | ICD-10-CM | POA: Insufficient documentation

## 2022-05-15 DIAGNOSIS — I2510 Atherosclerotic heart disease of native coronary artery without angina pectoris: Secondary | ICD-10-CM | POA: Diagnosis not present

## 2022-05-15 DIAGNOSIS — T83098A Other mechanical complication of other indwelling urethral catheter, initial encounter: Secondary | ICD-10-CM | POA: Insufficient documentation

## 2022-05-15 DIAGNOSIS — I10 Essential (primary) hypertension: Secondary | ICD-10-CM | POA: Diagnosis not present

## 2022-05-15 DIAGNOSIS — R339 Retention of urine, unspecified: Secondary | ICD-10-CM | POA: Insufficient documentation

## 2022-05-15 DIAGNOSIS — T83091A Other mechanical complication of indwelling urethral catheter, initial encounter: Secondary | ICD-10-CM | POA: Diagnosis not present

## 2022-05-15 NOTE — ED Triage Notes (Signed)
Pt states he has been emptying foley as he normally does but the foley bag itself seems to be leaking. Pt has no pain or urinary complaints

## 2022-05-16 ENCOUNTER — Emergency Department
Admission: EM | Admit: 2022-05-16 | Discharge: 2022-05-16 | Disposition: A | Discharge status: Home / Self Care | Payer: Medicare HMO | Attending: Emergency Medicine | Admitting: Emergency Medicine

## 2022-05-16 DIAGNOSIS — T839XXA Unspecified complication of genitourinary prosthetic device, implant and graft, initial encounter: Secondary | ICD-10-CM

## 2022-05-16 NOTE — ED Provider Notes (Signed)
[facility] Provider Note    Event Date/Time   First MD Initiated Contact with Patient 05/16/22 [phone]     (approximate)   History   foley leaking    HPI  Cameron [name] is a 71 y.o. male who presents to the ED from home with a leaking Foley catheter.  Patient currently wearing Foley catheter due to urinary retention.  Noted some leaking tonight.  Denies fever, chills, abdominal pain, nausea, vomiting, dysuria.     Past Medical History   Past Medical History:  Diagnosis Date   Adenomatous colon polyp 05/2009; 11/2014   2010:Tubular adenoma, no high grade dysplasia.Marland Kitchen  + Hyperplastic polyps. 2016: Tubular adenoma-rpt 5 yrs.   Coronary artery disease    H/O chronic gastritis 05/2009   EGD: no h.pylori,dysplasia,or evidence of malignancy   Hyperlipidemia    lovaza from prior PMD-stopped due to cost   Hypertension    Obesity, Class I, BMI 30.0-34.9 (see actual BMI) 04/23/2014   Tobacco dependence      Active Problem List   Patient Active Problem List   Diagnosis Date Noted   Screening for lung cancer 05/04/2022   Abnormal hemoglobin (Hgb) (HCC) 04/08/2020   Need for hepatitis C screening test 04/26/2018   Centrilobular emphysema ([place]) 04/20/2018   Polycythemia 04/20/2018   CAD (coronary artery disease) 03/31/2018   Visit for preventive health examination 02/27/2018   Overweight (BMI 25.0-29.9) 04/23/2014   Tobacco dependence 12/29/2012   Health maintenance examination 03/13/2012   Hyperlipemia, mixed 03/13/2012   HTN (hypertension), benign 02/11/2012     Past Surgical History   Past Surgical History:  Procedure Laterality Date   APPENDECTOMY  1973   CHOLECYSTECTOMY N/A 07/06/2018   Procedure: LAPAROSCOPIC CHOLECYSTECTOMY WITH INTRAOPERATIVE CHOLANGIOGRAM;  Surgeon: Alphonsa Overall, MD;  Location: [facility];  Service: General;  Laterality: N/A;   COLONOSCOPY  2010   [facility]   EYE SURGERY     STRABISMUS SURGERY  age 67 or 9     Home  Medications   Prior to Admission medications   Medication Sig Start Date End Date Taking? Authorizing Provider  aspirin 81 MG tablet Take 81 mg by mouth daily.    [provider]  atorvastatin (LIPITOR) 20 MG tablet Take 1 tablet (20 mg total) by mouth daily. 03/04/22   Maximiano Coss, NP  benazepril (LOTENSIN) 40 MG tablet Take 1 tablet (40 mg total) by mouth daily. 03/04/22   Maximiano Coss, NP  hydrochlorothiazide (MICROZIDE) 12.5 MG capsule Take 1 capsule (12.5 mg total) by mouth daily. 03/04/22   Maximiano Coss, NP  sildenafil (VIAGRA) 50 MG tablet Take 0.5-2 tablets (25-100 mg total) by mouth daily as needed for erectile dysfunction. 11/13/21   Maximiano Coss, NP  tamsulosin (FLOMAX) 0.4 MG CAPS capsule Take 1 capsule (0.4 mg total) by mouth daily. 04/28/22   Maximiano Coss, NP     Allergies  Patient has no known allergies.   Family History   Family History  Problem Relation Age of Onset   Diabetes Mother    Heart disease Mother    COPD Father    Hypertension Father    Hyperlipidemia Father    Diabetes Father    Heart attack Father    Dementia Father    Alcohol abuse Maternal Grandfather      Physical Exam  Triage Vital Signs: ED Triage Vitals  Enc Vitals Group     BP 05/15/22 2304 (!) 158/85     Pulse Rate  05/15/22 2304 69     Resp 05/15/22 2304 17     Temp 05/15/22 2304 98.2 F (36.8 C)     Temp Source 05/15/22 2304 Oral     SpO2 05/15/22 2304 97 %     Weight 05/15/22 2305 202 lb 13.2 oz (92 kg)     Height 05/15/22 2305 '5\' 10"'$  (1.778 m)     Head Circumference --      Peak Flow --      Pain Score 05/15/22 2305 0     Pain Loc --      Pain Edu? --      Excl. in [place]? --     Updated Vital Signs: BP (!) 158/85   Pulse 69   Temp 98.2 F (36.8 C) (Oral)   Resp 17   Ht '5\' 10"'$  (1.778 m)   Wt 92 kg   SpO2 97%   BMI 29.10 kg/m    General: Awake, no distress.  CV:  Good peripheral perfusion.  Resp:  Normal effort.  Abd:  No distention.   Other:  Foley catheter in place.  No leaking from urethra.  Small pool of urine stemming from leg bag soaking the elastic.   ED Results / Procedures / Treatments  Labs (all labs ordered are listed, but only abnormal results are displayed) Labs Reviewed - No data to display   EKG  None   RADIOLOGY None   Official radiology report(s): No results found.   PROCEDURES:  Critical Care performed: No  Procedures   MEDICATIONS ORDERED IN ED: Medications - No data to display   IMPRESSION / MDM / [place] / ED COURSE  I reviewed the triage vital signs and the nursing notes.                             71 year old male presenting with Foley catheter problem.  Tiny leak seems to be stemming from leg bag itself.  Nursing to replace as well as instructed patient on use of large urinary catheter bag to use at night so he does not have to get up frequently to empty his leg bag.  Patient's presentation is most consistent with acute, uncomplicated illness.  0144   New leg bag applied; no leakage noted. Patient instructed on use of large bag as well. Strict return precautions given. Patient and spouse verbalized understanding agree with plan of care.   FINAL CLINICAL IMPRESSION(S) / ED DIAGNOSES   Final diagnoses:  Foley catheter problem, initial encounter [facility])     Rx / DC Orders   ED Discharge Orders     None        Note:  This document was prepared using Dragon voice recognition software and may include unintentional dictation errors.   Paulette Blanch, MD 05/16/22 [phone]

## 2022-05-16 NOTE — Discharge Instructions (Signed)
Return to the ER for recurrent, worsening symptoms, persistent vomiting, fever or other concerns.

## 2022-05-16 NOTE — ED Notes (Signed)
Leg bag replaced and pt given standard foley bag and instructed in use. Noted urine in new bag at this time

## 2022-05-21 DIAGNOSIS — R338 Other retention of urine: Secondary | ICD-10-CM | POA: Diagnosis not present

## 2022-05-28 ENCOUNTER — Telehealth: Payer: Self-pay | Admitting: Nurse Practitioner

## 2022-05-28 DIAGNOSIS — N323 Diverticulum of bladder: Secondary | ICD-10-CM | POA: Diagnosis not present

## 2022-05-28 DIAGNOSIS — R338 Other retention of urine: Secondary | ICD-10-CM | POA: Diagnosis not present

## 2022-05-28 DIAGNOSIS — N130 Hydronephrosis with ureteropelvic junction obstruction: Secondary | ICD-10-CM | POA: Diagnosis not present

## 2022-05-28 NOTE — Telephone Encounter (Signed)
He can get them for free through the [place] quitline service. The number is [phone]

## 2022-05-28 NOTE — Telephone Encounter (Signed)
Patient called in and would like a call back from [facility] regarding nicotine patches that he discussed with Matt at his last visit.

## 2022-05-28 NOTE — Telephone Encounter (Signed)
Patient advised. He was driving and asked for this information to be sent via [employer].

## 2022-05-28 NOTE — Telephone Encounter (Signed)
Spoke with patient. Patient states he may have misunderstood but he thought [place] said patient could get patches for free? Wanted to check on that and how to get started on this

## 2022-06-03 DIAGNOSIS — R339 Retention of urine, unspecified: Secondary | ICD-10-CM | POA: Diagnosis not present

## 2022-06-03 DIAGNOSIS — N323 Diverticulum of bladder: Secondary | ICD-10-CM | POA: Diagnosis not present

## 2022-06-07 ENCOUNTER — Other Ambulatory Visit: Payer: Self-pay | Admitting: Nurse Practitioner

## 2022-06-07 DIAGNOSIS — E782 Mixed hyperlipidemia: Secondary | ICD-10-CM

## 2022-06-07 DIAGNOSIS — I10 Essential (primary) hypertension: Secondary | ICD-10-CM

## 2022-06-07 MED ORDER — ATORVASTATIN CALCIUM 20 MG PO TABS: 20.0000 mg | ORAL_TABLET | Freq: Every day | ORAL | 1 refills | Status: DC

## 2022-06-07 MED ORDER — HYDROCHLOROTHIAZIDE 12.5 MG PO CAPS: 12.5000 mg | ORAL_CAPSULE | Freq: Every day | ORAL | 1 refills | Status: DC

## 2022-06-07 MED ORDER — BENAZEPRIL HCL 40 MG PO TABS: 40.0000 mg | ORAL_TABLET | Freq: Every day | ORAL | 1 refills | Status: DC

## 2022-06-07 NOTE — Telephone Encounter (Signed)
  Encourage patient to contact the pharmacy for refills or they can request refills through [facility]  Did the patient contact the pharmacy: No  LAST APPOINTMENT DATE: 05/04/22  NEXT APPOINTMENT DATE: 11/04/22  MEDICATION: hydrochlorothiazide (MICROZIDE) 12.5 MG capsule.  atorvastatin (LIPITOR) 20 MG tablet  benazepril (LOTENSIN) 40 MG tablet  Is the patient out of medication? No  If not, how much is left? Enough until next week   [facility] [place], [place] to [facility]istered [facility] Sites  Let patient know to contact pharmacy at the end of the day to make sure medication is ready.  Please notify patient to allow 48-72 hours to process

## 2022-06-08 DIAGNOSIS — R339 Retention of urine, unspecified: Secondary | ICD-10-CM | POA: Diagnosis not present

## 2022-06-15 DIAGNOSIS — N401 Enlarged prostate with lower urinary tract symptoms: Secondary | ICD-10-CM | POA: Diagnosis not present

## 2022-06-15 DIAGNOSIS — N323 Diverticulum of bladder: Secondary | ICD-10-CM | POA: Diagnosis not present

## 2022-06-15 DIAGNOSIS — R338 Other retention of urine: Secondary | ICD-10-CM | POA: Diagnosis not present

## 2022-06-15 DIAGNOSIS — D225 Melanocytic nevi of trunk: Secondary | ICD-10-CM | POA: Diagnosis not present

## 2022-06-15 DIAGNOSIS — L570 Actinic keratosis: Secondary | ICD-10-CM | POA: Diagnosis not present

## 2022-06-15 DIAGNOSIS — L821 Other seborrheic keratosis: Secondary | ICD-10-CM | POA: Diagnosis not present

## 2022-06-23 ENCOUNTER — Encounter: Payer: Self-pay | Admitting: Nurse Practitioner

## 2022-06-23 ENCOUNTER — Ambulatory Visit (INDEPENDENT_AMBULATORY_CARE_PROVIDER_SITE_OTHER): Payer: Medicare HMO | Admitting: Nurse Practitioner

## 2022-06-23 VITALS — BP 130/72 | HR 65 | Temp 98.0°F | Resp 16 | Ht 74.0 in | Wt 206.1 lb

## 2022-06-23 DIAGNOSIS — J069 Acute upper respiratory infection, unspecified: Secondary | ICD-10-CM | POA: Insufficient documentation

## 2022-06-23 DIAGNOSIS — R051 Acute cough: Secondary | ICD-10-CM | POA: Diagnosis not present

## 2022-06-23 MED ORDER — AZITHROMYCIN 250 MG PO TABS: ORAL_TABLET | ORAL | 0 refills | Status: AC

## 2022-06-23 MED ORDER — ALBUTEROL SULFATE HFA 108 (90 BASE) MCG/ACT IN AERS: 2.0000 | INHALATION_SPRAY | Freq: Four times a day (QID) | RESPIRATORY_TRACT | 0 refills | Status: DC | PRN

## 2022-06-23 NOTE — Progress Notes (Signed)
Acute Office Visit  Subjective:     Patient ID: Cameron [name], male    DOB: 1951-04-09, 71 y.o.   MRN: [id number]  Chief Complaint  Patient presents with   Sinus Problem    Sx started about 5 to 6 days ago, coughing up some green phlegm, post nasal drip, runny nose. No Sob, no fever.    Sinus Problem Associated symptoms include congestion and coughing (some production with some green). Pertinent negatives include no chills, ear pain, headaches, shortness of breath or sore throat.   Patient is in today for Sinus problems   Symptoms started approx 5-6 days ago States that he has not been around any sick contacts States that he normally gets this yearly Alka seltzer over the coutner that has helped [employer] x2 and one booster  Review of Systems  Constitutional:  Positive for malaise/fatigue. Negative for chills and fever.  HENT:  Positive for congestion. Negative for ear discharge, ear pain, sinus pain and sore throat.   Respiratory:  Positive for cough (some production with some green). Negative for shortness of breath.   Musculoskeletal:  Negative for joint pain and myalgias.  Neurological:  Negative for headaches.        Objective:    BP 130/72   Pulse 65   Temp 98 F (36.7 C)   Resp 16   Ht '6\' 2"'$  (1.88 m)   Wt 206 lb 2 oz (93.5 kg)   SpO2 95%   BMI 26.46 kg/m    Physical Exam Vitals and nursing note reviewed.  Constitutional:      Appearance: Normal appearance.  HENT:     Right Ear: Tympanic membrane, ear canal and external ear normal.     Left Ear: Tympanic membrane, ear canal and external ear normal.     Nose:     Right Sinus: No maxillary sinus tenderness or frontal sinus tenderness.     Left Sinus: No maxillary sinus tenderness or frontal sinus tenderness.     Mouth/Throat:     Mouth: Mucous membranes are moist.     Pharynx: Oropharynx is clear.  Cardiovascular:     Rate and Rhythm: Normal rate and regular rhythm.     Heart sounds: Normal heart  sounds.  Pulmonary:     Effort: Pulmonary effort is normal.     Breath sounds: Rhonchi present.  Lymphadenopathy:     Cervical: No cervical adenopathy.  Neurological:     Mental Status: He is alert.     No results found for any visits on 06/23/22.      Assessment & Plan:   Problem List Items Addressed This Visit       Respiratory   Upper respiratory tract infection - Primary    Patient current least 1 pack a day smoker with a history of emphysema and advantageous breath sounds we will treat with azithromycin 250 mg pack as directed.  Also sending albuterol inhaler to use as needed patient not wheezing in office nor complaining of shortness of breath.      Relevant Medications   azithromycin (ZITHROMAX) 250 MG tablet   albuterol (VENTOLIN HFA) 108 (90 Base) MCG/ACT inhaler     Other   Acute cough    Antibiotics and inhaler as directed.  Can use over-the-counter cough medications if needed.       Meds ordered this encounter  Medications   azithromycin (ZITHROMAX) 250 MG tablet    Sig: Take 2 tablets on day 1, then  1 tablet daily on days 2 through 5    Dispense:  6 tablet    Refill:  0    Order Specific Question:   Supervising Provider    Answer:   Loura Pardon A [[id number]]   albuterol (VENTOLIN HFA) 108 (90 Base) MCG/ACT inhaler    Sig: Inhale 2 puffs into the lungs every 6 (six) hours as needed for wheezing or shortness of breath.    Dispense:  8 g    Refill:  0    Order Specific Question:   Supervising Provider    Answer:   Loura Pardon A [[id number]]    Return if symptoms worsen or fail to improve, for As scheduled.  Romilda Garret, NP

## 2022-06-23 NOTE — Assessment & Plan Note (Addendum)
Antibiotics and inhaler as directed.  Can use over-the-counter cough medications if needed.

## 2022-06-23 NOTE — Patient Instructions (Signed)
Nice to see you today I sent in an inhaler and antibiotic.  Follow up if you do not improve Keep your appt with me in February

## 2022-06-23 NOTE — Assessment & Plan Note (Signed)
Patient current least 1 pack a day smoker with a history of emphysema and advantageous breath sounds we will treat with azithromycin 250 mg pack as directed.  Also sending albuterol inhaler to use as needed patient not wheezing in office nor complaining of shortness of breath.

## 2022-06-25 ENCOUNTER — Other Ambulatory Visit: Payer: Self-pay | Admitting: Urology

## 2022-06-28 ENCOUNTER — Other Ambulatory Visit: Payer: Self-pay | Admitting: Urology

## 2022-07-01 NOTE — Patient Instructions (Signed)
DUE TO COVID-19 ONLY TWO VISITORS  (aged 69 and older)  ARE ALLOWED TO COME WITH YOU AND STAY IN THE WAITING ROOM ONLY DURING PRE OP AND PROCEDURE.   **NO VISITORS ARE ALLOWED IN THE SHORT STAY AREA OR RECOVERY ROOM!!**  IF YOU WILL BE ADMITTED INTO THE HOSPITAL YOU ARE ALLOWED ONLY FOUR SUPPORT PEOPLE DURING VISITATION HOURS ONLY (7 AM -8PM)   The support person(s) must pass our screening, gel in and out, and wear a mask at all times, including in the patient's room. Patients must also wear a mask when staff or their support person are in the room. Visitors GUEST BADGE MUST BE WORN VISIBLY  One adult visitor may remain with you overnight and MUST be in the room by 8 P.M.     Your procedure is scheduled on: 07/08/22   Report to [facility] Main Entrance    Report to admitting at : 7:15 AM   Call this number if you have problems the morning of surgery [phone]   Do not eat food :After Midnight.   After Midnight you may have the following liquids until: 6:30 AM DAY OF SURGERY  Water Black Coffee (sugar ok, NO MILK/CREAM OR CREAMERS)  Tea (sugar ok, NO MILK/CREAM OR CREAMERS) regular and decaf                             Plain Jell-O (NO RED)                                           Fruit ices (not with fruit pulp, NO RED)                                     Popsicles (NO RED)                                                                  Juice: apple, WHITE grape, WHITE cranberry Sports drinks like Gatorade (NO RED  FOLLOW BOWEL PREP AND ANY ADDITIONAL PRE OP INSTRUCTIONS YOU RECEIVED FROM YOUR SURGEON'S OFFICE!!!   Oral Hygiene is also important to reduce your risk of infection.                                    Remember - BRUSH YOUR TEETH THE MORNING OF SURGERY WITH YOUR REGULAR TOOTHPASTE   Do NOT smoke after Midnight   Take these medicines the morning of surgery with A SIP OF WATER: N/A. Use inhalers as usual  Bring CPAP mask and tubing day of surgery.                               You may not have any metal on your body including hair pins, jewelry, and body piercing             Do not wear lotions, powders, perfumes/cologne, or deodorant  Men may shave face and neck.   Do not bring valuables to the hospital. [place].   Contacts, dentures or bridgework may not be worn into surgery.   Bring small overnight bag day of surgery.   DO NOT [place]. PHARMACY WILL DISPENSE MEDICATIONS LISTED ON YOUR MEDICATION LIST TO YOU DURING YOUR ADMISSION [place]!    Patients discharged on the day of surgery will not be allowed to drive home.  Someone NEEDS to stay with you for the first 24 hours after anesthesia.   Special Instructions: Bring a copy of your healthcare power of attorney and living will documents         the day of surgery if you haven't scanned them before.              Please read over the following fact sheets you were given: IF YOU HAVE QUESTIONS ABOUT YOUR PRE-OP INSTRUCTIONS PLEASE CALL [phone]     [facility] - Preparing for Surgery Before surgery, you can play an important role.  Because skin is not sterile, your skin needs to be as free of germs as possible.  You can reduce the number of germs on your skin by washing with CHG (chlorahexidine gluconate) soap before surgery.  CHG is an antiseptic cleaner which kills germs and bonds with the skin to continue killing germs even after washing. Please DO NOT use if you have an allergy to CHG or antibacterial soaps.  If your skin becomes reddened/irritated stop using the CHG and inform your nurse when you arrive at Short Stay. Do not shave (including legs and underarms) for at least 48 hours prior to the first CHG shower.  You may shave your face/neck. Please follow these instructions carefully:  1.  Shower with CHG Soap the night before surgery and the  morning of Surgery.  2.  If  you choose to wash your hair, wash your hair first as usual with your  normal  shampoo.  3.  After you shampoo, rinse your hair and body thoroughly to remove the  shampoo.                           4.  Use CHG as you would any other liquid soap.  You can apply chg directly  to the skin and wash                       Gently with a scrungie or clean washcloth.  5.  Apply the CHG Soap to your body ONLY FROM THE NECK DOWN.   Do not use on face/ open                           Wound or open sores. Avoid contact with eyes, ears mouth and genitals (private parts).                       Wash face,  Genitals (private parts) with your normal soap.             6.  Wash thoroughly, paying special attention to the area where your surgery  will be performed.  7.  Thoroughly rinse your body with warm water from the neck down.  8.  DO NOT shower/wash with your normal soap after using and rinsing off  the CHG Soap.                9.  Pat yourself dry with a clean towel.            10.  Wear clean pajamas.            11.  Place clean sheets on your bed the night of your first shower and do not  sleep with pets. Day of Surgery : Do not apply any lotions/deodorants the morning of surgery.  Please wear clean clothes to the hospital/surgery center.  FAILURE TO FOLLOW THESE INSTRUCTIONS MAY RESULT IN THE CANCELLATION OF YOUR SURGERY PATIENT SIGNATURE_________________________________  NURSE SIGNATURE__________________________________  ________________________________________________________________________

## 2022-07-02 ENCOUNTER — Encounter (HOSPITAL_COMMUNITY): Payer: Self-pay

## 2022-07-02 ENCOUNTER — Encounter (HOSPITAL_COMMUNITY)
Admission: RE | Admit: 2022-07-02 | Discharge: 2022-07-02 | Disposition: A | Discharge status: Home / Self Care | Payer: Medicare HMO | Source: Ambulatory Visit | Attending: Urology | Admitting: Urology

## 2022-07-02 ENCOUNTER — Other Ambulatory Visit: Payer: Self-pay

## 2022-07-02 VITALS — BP 126/69 | HR 68 | Temp 97.6°F | Resp 18 | Ht 70.0 in | Wt 200.5 lb

## 2022-07-02 DIAGNOSIS — Z01818 Encounter for other preprocedural examination: Secondary | ICD-10-CM | POA: Insufficient documentation

## 2022-07-02 DIAGNOSIS — R9431 Abnormal electrocardiogram [ECG] [EKG]: Secondary | ICD-10-CM | POA: Insufficient documentation

## 2022-07-02 DIAGNOSIS — I10 Essential (primary) hypertension: Secondary | ICD-10-CM | POA: Diagnosis not present

## 2022-07-02 LAB — BASIC METABOLIC PANEL
Anion gap: 7 (ref 5–15)
BUN: 14 mg/dL (ref 8–23)
CO2: 30 mmol/L (ref 22–32)
Calcium: 8.9 mg/dL (ref 8.9–10.3)
Chloride: 98 mmol/L (ref 98–111)
Creatinine, Ser: 0.91 mg/dL (ref 0.61–1.24)
GFR, Estimated: >60 mL/min (ref >60)
Glucose, Bld: 120 mg/dL — ABNORMAL HIGH (ref 70–99)
Potassium: 4.2 mmol/L (ref 3.5–5.1)
Sodium: 135 mmol/L (ref 135–145)

## 2022-07-02 LAB — CBC
HCT: 46.5 % (ref 39.0–52.0)
Hemoglobin: 15.6 g/dL (ref 13.0–17.0)
MCH: 31.5 pg (ref 26.0–34.0)
MCHC: 33.5 g/dL (ref 30.0–36.0)
MCV: 93.8 fL (ref 80.0–100.0)
Platelets: 273 10*3/uL (ref 150–400)
RBC: 4.96 MIL/uL (ref 4.22–5.81)
RDW: 12.9 % (ref 11.5–15.5)
WBC: 9.5 10*3/uL (ref 4.0–10.5)
nRBC: 0 % (ref 0.0–0.2)

## 2022-07-02 NOTE — Progress Notes (Signed)
For Short Stay: [place] appointment date: Date of COVID positive in last 72 days:  Bowel Prep reminder:   For Anesthesia: PCP - NP: Karl Ito Cardiologist -   Chest x-ray -  EKG -  Stress Test -  ECHO -  Cardiac Cath -  Pacemaker/ICD device last checked: Pacemaker orders received: Device Rep notified:  Spinal Cord Stimulator:  Sleep Study -  CPAP -   Fasting Blood Sugar -  Checks Blood Sugar _____ times a day Date and result of last Hgb A1c-  Blood Thinner Instructions: Aspirin Instructions: On hold since: 06/28/22 : Dr. Alinda Money Last Dose:  Activity level: Can go up a flight of stairs and activities of daily living without stopping and without chest pain and/or shortness of breath   Able to exercise without chest pain and/or shortness of breath   Unable to go up a flight of stairs without chest pain and/or shortness of breath     Anesthesia review: Hx: HTN,CAD,Smoker.  Patient denies shortness of breath, fever, cough and chest pain at PAT appointment   Patient verbalized understanding of instructions that were given to them at the PAT appointment. Patient was also instructed that they will need to review over the PAT instructions again at home before surgery.

## 2022-07-07 NOTE — H&P (Signed)
Office Visit Report     06/15/2022   --------------------------------------------------------------------------------   Cameron [name]  MRN: [id number]  DOB: 05/01/1951, 71 year old Male  SSN:    PRIMARY CARE:    REFERRING:  Phillip Heal L. Cain Sieve, MD  PROVIDER:  Raynelle Bring, M.D.  LOCATION:  [facility] [phone]     --------------------------------------------------------------------------------   CC/HPI: 1. Urinary retention  2. Bladder diverticulum   Cameron [name] is a pleasant 71 year old gentleman who has had some gradual onset of worsening lower urinary tract symptoms over the past few years but has never been treated for BPH. Prior to an episode of urinary retention in August, his IPSS was approximately 19. However, he was not particular bothered by his symptoms and did not think much of them. On 04/28/2022, he presented to the emergency room with acute urinary retention. He had a catheter placed with over 1000 cc of urine returned. A CT scan of the abdomen and pelvis was performed at that time which demonstrated a distended bladder along with a large right-sided bladder diverticulum causing left-sided distortion of the bladder as well as some mild right hydroureter. He appeared to have normal renal parenchyma bilaterally. Serum creatinine at that time was 0.98. He has had no prior history of urinary tract infections or bladder calculi. He denies a history of hematuria. He was placed on tamsulosin but apparently still could not void after voiding trials. He was seen by Dr. Cain Sieve and eventually underwent a urodynamic evaluation on 05/21/2022 demonstrated some decreased sensation of the bladder, and unstable bladder with a maximum capacity of almost 1000 cc. His maximum detrusor pressure was 67 cm of water. He was able to void a little over 100 cc but had a PVR of over 800 cc. He was started on clean intermittent catheterization which she has been performing approximately every 6 hours.  He has not had difficulty with catheterization although may have had 1 urinary tract infection. No culture was obtained but he was successfully treated with Levaquin with resolution of low-grade fever.     ALLERGIES: No Known Drug Allergies    MEDICATIONS: Aspirin  Hydrochlorothiazide 12.5 mg tablet  Tamsulosin Hcl 0.4 mg capsule  Atorvastatin Calcium 20 mg tablet  Benazepril Hcl 40 mg tablet  Sildenafil Citrate 50 mg tablet  Vitamin C     GU PSH: Complex cystometrogram, w/ void pressure and urethral pressure profile studies, any technique - 05/21/2022 Complex Uroflow - 05/21/2022 Emg surf Electrd - 05/21/2022 Inject For cystogram - 05/21/2022 Intrabd voidng Press - 05/21/2022     NON-GU PSH: Appendectomy (laparoscopic) Cholecystectomy (laparoscopic)     GU PMH: Bladder Diverticulum - 06/03/2022, - 05/28/2022, - 05/12/2022 Urinary Retention, Unspec - 06/03/2022 Hydronephrosis - 05/28/2022, - 05/12/2022 Urinary Retention - 05/28/2022, - 05/21/2022, - 05/12/2022    NON-GU PMH: Anxiety Hypercholesterolemia Hypertension    FAMILY HISTORY: 2 daughters - Other Congestive Heart Failure - Father Diabetes - Mother Emphysema - Father   SOCIAL HISTORY: Marital Status: Widowed Preferred Language: English; Race: White Current Smoking Status: Patient smokes.   Tobacco Use Assessment Completed: Used Tobacco in last 30 days? Drinks 4+ caffeinated drinks per day.    REVIEW OF SYSTEMS:    GU Review Male:   Patient denies frequent urination, hard to postpone urination, burning/ pain with urination, get up at night to urinate, leakage of urine, stream starts and stops, trouble starting your streams, and have to strain to urinate .  Gastrointestinal (Lower):   Patient denies diarrhea  and constipation.  Gastrointestinal (Upper):   Patient denies nausea and vomiting.  Constitutional:   Patient denies weight loss, night sweats, fatigue, and fever.  Skin:   Patient denies skin rash/ lesion and itching.   Eyes:   Patient denies blurred vision and double vision.  Ears/ Nose/ Throat:   Patient denies sore throat and sinus problems.  Hematologic/Lymphatic:   Patient denies swollen glands and easy bruising.  Cardiovascular:   Patient denies leg swelling and chest pains.  Respiratory:   Patient denies cough and shortness of breath.  Endocrine:   Patient denies excessive thirst.  Musculoskeletal:   Patient denies back pain and joint pain.  Neurological:   Patient denies headaches and dizziness.  Psychologic:   Patient denies depression and anxiety.   VITAL SIGNS:      06/15/2022 10:54 AM  Weight 202 lb / 91.63 kg  Height 70 in / 177.8 cm  BP 127/71 mmHg  Pulse 63 /min  BMI 29.0 kg/m   GU PHYSICAL EXAMINATION:    Prostate: Prostate about 50 grams. Left lobe normal consistency, right lobe normal consistency. Symmetrical lobes. No prostate nodule. Left lobe no tenderness, right lobe no tenderness.    MULTI-SYSTEM PHYSICAL EXAMINATION:    Constitutional: Well-nourished. No physical deformities. Normally developed. Good grooming.  Respiratory: No labored breathing, no use of accessory muscles. Clear bilaterally.  Cardiovascular: Normal temperature, normal extremity pulses, no swelling, no varicosities. Regular rate and rhythm.  Gastrointestinal: No mass, no tenderness, no rigidity, non obese abdomen. He does have a reducible right inguinal hernia.     Complexity of Data:  Lab Test Review:   PSA  Records Review:   Previous Patient Records  X-Ray Review: C.T. Abdomen/Pelvis: Reviewed Films.    Notes:                     CLINICAL DATA: New bowel incontinence. Urinary hesitancy.  Incomplete bladder emptying incontinence of feces   EXAM:  CT ABDOMEN AND PELVIS WITH CONTRAST   TECHNIQUE:  Multidetector CT imaging of the abdomen and pelvis was performed  using the standard protocol following bolus administration of  intravenous contrast.   RADIATION DOSE REDUCTION: This exam was performed  according to the  departmental dose-optimization program which includes automated  exposure control, adjustment of the mA and/or kV according to  patient size and/or use of iterative reconstruction technique.   CONTRAST: 45m ISOVUE-370 IOPAMIDOL (ISOVUE-370) INJECTION 76%   COMPARISON: None Available.   FINDINGS:  Lower chest: Lung bases are clear.   Hepatobiliary: No focal hepatic lesion. Postcholecystectomy. No  biliary dilatation.   Pancreas: Pancreas is normal. No ductal dilatation. No pancreatic  inflammation.   Spleen: Normal spleen   Adrenals/urinary tract: Adrenal glands are normal. There is  hydronephrosis and hydroureter of the RIGHT kidney. LEFT kidney  appears normal.   There is a large bladder diverticulum posterior RIGHT of the  bladder. The diverticulum measures equally as large as the bladder  itself. The bladder is distended.   Bladder diverticulum measures 17.3 x 8.7 by 13 cm (volume = 1000  cm^3).   Bladder measures 13.7 by 9.2 by 16.6 cm (volume = 1100 cm^3).   The bladder is elevated anteriorly to the LEFT by the very large  bladder diverticulum.   The large posterior RIGHT bladder diverticulum appears to obstruct  the RIGHT ureter.   Prostate grossly normal.   Stomach/Bowel: Stomach, small bowel cecum normal. Appendix not  identified. The colon and rectosigmoid colon  are normal.   Vascular/Lymphatic: Abdominal aorta is normal caliber with  atherosclerotic calcification. There is no retroperitoneal or  periportal lymphadenopathy. No pelvic lymphadenopathy. Fusiform  dilatation of the abdominal aorta 35 mm.   Reproductive: Prostate unremarkable   Other: No free fluid.   Musculoskeletal: No aggressive osseous lesion.   IMPRESSION:  1. Dominant finding is distended bladder elevated anterior to the  LEFT by equally large bladder diverticulum. Findings suggest bladder  outlet obstruction.  2. Obstruction of the RIGHT ureter by the unusually  bladder large  diverticulum.  3. LEFT kidney appears normal.  4. Recommend urology consultation.   These results will be called to the ordering clinician or  representative by the Radiologist Assistant, and communication  documented in the PACS or [employer].    Electronically Signed  By: Suzy Bouchard M.D.  On: 04/28/2022 14:18   His PSA from August was 0.98.   PROCEDURES:         PVR Ultrasound - 01027  Scanned Volume: 130 cc         Urinalysis w/Scope Dipstick Dipstick Cont'd Micro  Color: Yellow Bilirubin: Neg mg/dL WBC/hpf: 6 - 10/hpf  Appearance: Cloudy Ketones: Neg mg/dL RBC/hpf: 3 - 10/hpf  Specific Gravity: 1.015 Blood: 1+ ery/uL Bacteria: Many (>50/hpf)  pH: 6.5 Protein: Neg mg/dL Cystals: NS (Not Seen)  Glucose: Neg mg/dL Urobilinogen: 0.2 mg/dL Casts: NS (Not Seen)    Nitrites: Positive Trichomonas: Not Present    Leukocyte Esterase: Neg leu/uL Mucous: Present      Epithelial Cells: 0 - 5/hpf      Yeast: NS (Not Seen)      Sperm: Not Present    ASSESSMENT:      ICD-10 Details  1 GU:   Urinary Retention - R33.8   2   Bladder Diverticulum - N32.3   3   BPH w/LUTS - N40.1    PLAN:           Orders Labs Urine Culture          Schedule Return Visit/Planned Activity: Other See Visit Notes             Note: Will call to schedule surgery.          Document Letter(s):  Created for Chart: Follow Up Letter   Created for Patient: Clinical Summary         Notes:   1. BPH/urinary retention: Based on his urodynamic evaluation, it appears that he has adequate detrusor function with bladder outlet obstruction related to BPH. His prostate does not appear to be extremely large on imaging and we discussed options on how to approach this as well as in the context of his large bladder diverticulum. We discussed options of treating his BPH as we would without concomitant treatment of his diverticulum or treatment of his BPH in the setting of a procedure for his  diverticulum. Ultimately, we have agreed to proceed with cystoscopy and transurethral resection of the prostate. We have reviewed this procedure in detail including the potential risks, complications, and the expected recovery process. He would like to proceed with this soon as possible to be able to stop intermittent catheterization. He would like to also proceed with left upper extremity surgery that he requires shortly after this procedure and he understands that that would be appropriate. His urine will be cultured today.   2. Large right-sided bladder diverticulum: We discussed this finding in detail. He has not had any complicating features but did develop a  little bit of right hydroureter likely related to compression when he was in urinary retention. This is not likely to be a major issue while he is performing intermittent catheterization but he does understand the long-term risk for urinary tract infection, stone formation, etc. Ultimately, he does think that he would like to address this but we have agreed to do this in a delayed an elective fashion after he is recovered from his transurethral resection of the prostate and is voiding well. We will then discuss proceeding with a robot-assisted laparoscopic bladder diverticulectomy.   CC: Dr. Anice Paganini, PA-C    * Signed by Raynelle Bring, M.D. on 06/15/22 at 1:23 PM (EDT)*     APPENDED NOTES:    Doxycycline started prior to surgery.    * Signed by Raynelle Bring, M.D. on 06/26/22 at 6:41 AM (EDT)*

## 2022-07-08 ENCOUNTER — Encounter (HOSPITAL_COMMUNITY)
Admission: RE | Disposition: A | Discharge status: Home / Self Care | Payer: Self-pay | Source: Home / Self Care | Attending: Urology

## 2022-07-08 ENCOUNTER — Ambulatory Visit (HOSPITAL_BASED_OUTPATIENT_CLINIC_OR_DEPARTMENT_OTHER): Payer: Medicare HMO | Admitting: Anesthesiology

## 2022-07-08 ENCOUNTER — Ambulatory Visit (HOSPITAL_COMMUNITY): Payer: Medicare HMO | Admitting: Physician Assistant

## 2022-07-08 ENCOUNTER — Observation Stay (HOSPITAL_COMMUNITY)
Admission: RE | Admit: 2022-07-08 | Discharge: 2022-07-09 | Disposition: A | Discharge status: Home / Self Care | Payer: Medicare HMO | Attending: Urology | Admitting: Urology

## 2022-07-08 ENCOUNTER — Other Ambulatory Visit: Payer: Self-pay

## 2022-07-08 ENCOUNTER — Encounter (HOSPITAL_COMMUNITY): Payer: Self-pay | Admitting: Urology

## 2022-07-08 DIAGNOSIS — J449 Chronic obstructive pulmonary disease, unspecified: Secondary | ICD-10-CM | POA: Diagnosis not present

## 2022-07-08 DIAGNOSIS — N401 Enlarged prostate with lower urinary tract symptoms: Principal | ICD-10-CM | POA: Insufficient documentation

## 2022-07-08 DIAGNOSIS — N400 Enlarged prostate without lower urinary tract symptoms: Secondary | ICD-10-CM | POA: Diagnosis present

## 2022-07-08 DIAGNOSIS — Z7982 Long term (current) use of aspirin: Secondary | ICD-10-CM | POA: Insufficient documentation

## 2022-07-08 DIAGNOSIS — R338 Other retention of urine: Secondary | ICD-10-CM | POA: Insufficient documentation

## 2022-07-08 DIAGNOSIS — N323 Diverticulum of bladder: Secondary | ICD-10-CM | POA: Insufficient documentation

## 2022-07-08 DIAGNOSIS — I10 Essential (primary) hypertension: Secondary | ICD-10-CM

## 2022-07-08 DIAGNOSIS — N138 Other obstructive and reflux uropathy: Secondary | ICD-10-CM | POA: Diagnosis not present

## 2022-07-08 DIAGNOSIS — F17210 Nicotine dependence, cigarettes, uncomplicated: Secondary | ICD-10-CM

## 2022-07-08 DIAGNOSIS — C61 Malignant neoplasm of prostate: Secondary | ICD-10-CM | POA: Diagnosis not present

## 2022-07-08 DIAGNOSIS — R69 Illness, unspecified: Secondary | ICD-10-CM | POA: Diagnosis not present

## 2022-07-08 DIAGNOSIS — F17200 Nicotine dependence, unspecified, uncomplicated: Secondary | ICD-10-CM | POA: Insufficient documentation

## 2022-07-08 HISTORY — PX: TRANSURETHRAL RESECTION OF PROSTATE: SHX73

## 2022-07-08 SURGERY — TURP (TRANSURETHRAL RESECTION OF PROSTATE)
Anesthesia: General

## 2022-07-08 MED ORDER — SODIUM CHLORIDE 0.9 % IR SOLN
Status: DC | PRN
  Administered 2022-07-08: 18000 mL

## 2022-07-08 MED ORDER — SODIUM CHLORIDE 0.9% FLUSH: 3.0000 mL | INTRAVENOUS | Status: DC | PRN

## 2022-07-08 MED ORDER — ACETAMINOPHEN 500 MG PO TABS
1000.0000 mg | ORAL_TABLET | Freq: Once | ORAL | Status: AC
  Administered 2022-07-08: 1000 mg via ORAL
  Filled 2022-07-08: qty 2

## 2022-07-08 MED ORDER — DEXAMETHASONE SODIUM PHOSPHATE 10 MG/ML IJ SOLN
INTRAMUSCULAR | Status: DC | PRN
  Administered 2022-07-08: 4 mg via INTRAVENOUS

## 2022-07-08 MED ORDER — MIDAZOLAM HCL 2 MG/2ML IJ SOLN
INTRAMUSCULAR | Status: AC
  Filled 2022-07-08: qty 2

## 2022-07-08 MED ORDER — FENTANYL CITRATE (PF) 250 MCG/5ML IJ SOLN
INTRAMUSCULAR | Status: DC | PRN
  Administered 2022-07-08 (×3): 50 ug via INTRAVENOUS

## 2022-07-08 MED ORDER — ONDANSETRON HCL 4 MG/2ML IJ SOLN
INTRAMUSCULAR | Status: DC | PRN
  Administered 2022-07-08: 4 mg via INTRAVENOUS

## 2022-07-08 MED ORDER — HYDROCODONE-ACETAMINOPHEN 5-325 MG PO TABS: 1.0000 | ORAL_TABLET | ORAL | Status: DC | PRN

## 2022-07-08 MED ORDER — PHENYLEPHRINE 80 MCG/ML (10ML) SYRINGE FOR IV PUSH (FOR BLOOD PRESSURE SUPPORT)
PREFILLED_SYRINGE | INTRAVENOUS | Status: AC
  Filled 2022-07-08: qty 10

## 2022-07-08 MED ORDER — HYDROCHLOROTHIAZIDE 12.5 MG PO TABS
12.5000 mg | ORAL_TABLET | Freq: Every day | ORAL | Status: DC
  Administered 2022-07-09: 12.5 mg via ORAL
  Filled 2022-07-08: qty 1

## 2022-07-08 MED ORDER — ORAL CARE MOUTH RINSE: 15.0000 mL | Freq: Once | OROMUCOSAL | Status: AC

## 2022-07-08 MED ORDER — PROPOFOL 10 MG/ML IV BOLUS
INTRAVENOUS | Status: AC
  Filled 2022-07-08: qty 20

## 2022-07-08 MED ORDER — LIDOCAINE HCL (PF) 2 % IJ SOLN
INTRAMUSCULAR | Status: AC
  Filled 2022-07-08: qty 5

## 2022-07-08 MED ORDER — MIDAZOLAM HCL 5 MG/5ML IJ SOLN
INTRAMUSCULAR | Status: DC | PRN
  Administered 2022-07-08: 2 mg via INTRAVENOUS

## 2022-07-08 MED ORDER — CHLORHEXIDINE GLUCONATE 0.12 % MT SOLN
15.0000 mL | Freq: Once | OROMUCOSAL | Status: AC
  Administered 2022-07-08: 15 mL via OROMUCOSAL

## 2022-07-08 MED ORDER — DOCUSATE SODIUM 100 MG PO CAPS
100.0000 mg | ORAL_CAPSULE | Freq: Two times a day (BID) | ORAL | Status: DC
  Administered 2022-07-08 – 2022-07-09 (×2): 100 mg via ORAL
  Filled 2022-07-08 (×3): qty 1

## 2022-07-08 MED ORDER — DIPHENHYDRAMINE HCL 50 MG/ML IJ SOLN: 12.5000 mg | Freq: Four times a day (QID) | INTRAMUSCULAR | Status: DC | PRN

## 2022-07-08 MED ORDER — ONDANSETRON HCL 4 MG/2ML IJ SOLN: 4.0000 mg | INTRAMUSCULAR | Status: DC | PRN

## 2022-07-08 MED ORDER — FENTANYL CITRATE PF 50 MCG/ML IJ SOSY: 25.0000 ug | PREFILLED_SYRINGE | INTRAMUSCULAR | Status: DC | PRN

## 2022-07-08 MED ORDER — SODIUM CHLORIDE 0.45 % IV SOLN: INTRAVENOUS | Status: DC

## 2022-07-08 MED ORDER — PROPOFOL 10 MG/ML IV BOLUS
INTRAVENOUS | Status: DC | PRN
  Administered 2022-07-08: 150 mg via INTRAVENOUS
  Administered 2022-07-08: 50 mg via INTRAVENOUS

## 2022-07-08 MED ORDER — GENTAMICIN SULFATE 40 MG/ML IJ SOLN
460.0000 mg | Freq: Once | INTRAVENOUS | Status: AC
  Administered 2022-07-08: 460 mg via INTRAVENOUS
  Filled 2022-07-08: qty 11.5

## 2022-07-08 MED ORDER — ATORVASTATIN CALCIUM 20 MG PO TABS
20.0000 mg | ORAL_TABLET | Freq: Every day | ORAL | Status: DC
  Administered 2022-07-09: 20 mg via ORAL
  Filled 2022-07-08: qty 1

## 2022-07-08 MED ORDER — SODIUM CHLORIDE 0.9 % IR SOLN
3000.0000 mL | Status: DC
  Administered 2022-07-08 – 2022-07-09 (×13): 3000 mL

## 2022-07-08 MED ORDER — BENAZEPRIL HCL 20 MG PO TABS
40.0000 mg | ORAL_TABLET | Freq: Every day | ORAL | Status: DC
  Administered 2022-07-09: 40 mg via ORAL
  Filled 2022-07-08: qty 2

## 2022-07-08 MED ORDER — ALBUTEROL SULFATE (2.5 MG/3ML) 0.083% IN NEBU: 3.0000 mL | INHALATION_SOLUTION | Freq: Four times a day (QID) | RESPIRATORY_TRACT | Status: DC | PRN

## 2022-07-08 MED ORDER — SODIUM CHLORIDE 0.9 % IV SOLN: 250.0000 mL | INTRAVENOUS | Status: DC | PRN

## 2022-07-08 MED ORDER — DIPHENHYDRAMINE HCL 12.5 MG/5ML PO ELIX: 12.5000 mg | ORAL_SOLUTION | Freq: Four times a day (QID) | ORAL | Status: DC | PRN

## 2022-07-08 MED ORDER — ONDANSETRON HCL 4 MG/2ML IJ SOLN
INTRAMUSCULAR | Status: AC
  Filled 2022-07-08: qty 2

## 2022-07-08 MED ORDER — PHENYLEPHRINE 80 MCG/ML (10ML) SYRINGE FOR IV PUSH (FOR BLOOD PRESSURE SUPPORT)
PREFILLED_SYRINGE | INTRAVENOUS | Status: DC | PRN
  Administered 2022-07-08: 160 ug via INTRAVENOUS
  Administered 2022-07-08: 120 ug via INTRAVENOUS
  Administered 2022-07-08: 80 ug via INTRAVENOUS
  Administered 2022-07-08: 120 ug via INTRAVENOUS
  Administered 2022-07-08: 80 ug via INTRAVENOUS

## 2022-07-08 MED ORDER — ZOLPIDEM TARTRATE 5 MG PO TABS: 5.0000 mg | ORAL_TABLET | Freq: Every evening | ORAL | Status: DC | PRN

## 2022-07-08 MED ORDER — DEXAMETHASONE SODIUM PHOSPHATE 10 MG/ML IJ SOLN
INTRAMUSCULAR | Status: AC
  Filled 2022-07-08: qty 1

## 2022-07-08 MED ORDER — LACTATED RINGERS IV SOLN: INTRAVENOUS | Status: DC

## 2022-07-08 MED ORDER — LIDOCAINE 2% (20 MG/ML) 5 ML SYRINGE
INTRAMUSCULAR | Status: DC | PRN
  Administered 2022-07-08: 100 mg via INTRAVENOUS

## 2022-07-08 MED ORDER — EPHEDRINE SULFATE-NACL 50-0.9 MG/10ML-% IV SOSY
PREFILLED_SYRINGE | INTRAVENOUS | Status: DC | PRN
  Administered 2022-07-08 (×2): 5 mg via INTRAVENOUS

## 2022-07-08 MED ORDER — SODIUM CHLORIDE 0.9% FLUSH
3.0000 mL | Freq: Two times a day (BID) | INTRAVENOUS | Status: DC
  Administered 2022-07-08: 3 mL via INTRAVENOUS

## 2022-07-08 MED ORDER — FENTANYL CITRATE (PF) 250 MCG/5ML IJ SOLN
INTRAMUSCULAR | Status: AC
  Filled 2022-07-08: qty 5

## 2022-07-08 SURGICAL SUPPLY — 18 items
BAG DRN RND TRDRP ANRFLXCHMBR (UROLOGICAL SUPPLIES) ×1
BAG URINE DRAIN 2000ML AR STRL (UROLOGICAL SUPPLIES) ×1 IMPLANT
BAG URO CATCHER STRL LF (MISCELLANEOUS) ×1 IMPLANT
CATH HEMA 3WAY 30CC 22FR COUDE (CATHETERS) IMPLANT
DRAPE FOOT SWITCH (DRAPES) ×1 IMPLANT
GLOVE SURG LX STRL 7.5 STRW (GLOVE) ×1 IMPLANT
GOWN STRL REUS W/ TWL XL LVL3 (GOWN DISPOSABLE) ×1 IMPLANT
GOWN STRL REUS W/TWL XL LVL3 (GOWN DISPOSABLE) ×1
HOLDER FOLEY CATH W/STRAP (MISCELLANEOUS) IMPLANT
LOOP CUT BIPOLAR 24F LRG (ELECTROSURGICAL) IMPLANT
MANIFOLD NEPTUNE II (INSTRUMENTS) ×1 IMPLANT
PACK CYSTO (CUSTOM PROCEDURE TRAY) ×1 IMPLANT
PENCIL SMOKE EVACUATOR (MISCELLANEOUS) IMPLANT
SYR 30ML LL (SYRINGE) ×1 IMPLANT
SYR TOOMEY IRRIG 70ML (MISCELLANEOUS) ×1
SYRINGE TOOMEY IRRIG 70ML (MISCELLANEOUS) ×1 IMPLANT
TUBING CONNECTING 10 (TUBING) ×1 IMPLANT
TUBING UROLOGY SET (TUBING) ×1 IMPLANT

## 2022-07-08 NOTE — Discharge Instructions (Signed)

## 2022-07-08 NOTE — Progress Notes (Signed)
Patient ID: Cameron [name], male   DOB: 26-Dec-1950, 71 y.o.   MRN: [id number]  Post-op note  Subjective: The patient is doing well.  No complaints.  Objective: Vital signs in last 24 hours: Temp:  [97.4 F (36.3 C)-97.9 F (36.6 C)] 97.8 F (36.6 C) (10/12 1355) Pulse Rate:  [57-83] 77 (10/12 1355) Resp:  [11-18] 18 (10/12 1355) BP: (108-124)/(59-75) 124/75 (10/12 1355) SpO2:  [93 %-100 %] 97 % (10/12 1355) Weight:  [90.9 kg] 90.9 kg (10/12 0753)  Intake/Output from previous day: No intake/output data recorded. Intake/Output this shift: Total I/O In: 1411.5 [I.V.:1200; Other:100; IV Piggyback:111.5] Out: 3700 [Urine:3700]  Physical Exam:  General: Alert and oriented. GU: Urine clear on minimal CBI  Lab Results: No results for input(s): "HGB", "HCT" in the last 72 hours.  Assessment/Plan: POD#0   1) Continue to monitor, wean CBI off overnight   Cameron [name]. MD   LOS: 0 days   Cameron [name] 07/08/2022, 3:14 PM

## 2022-07-08 NOTE — Anesthesia Postprocedure Evaluation (Signed)
Anesthesia Post Note  Patient: Cameron [name]  Procedure(s) Performed: TRANSURETHRAL RESECTION OF THE PROSTATE (TURP)/ CYSTOSCOPY     Patient location during evaluation: PACU Anesthesia Type: General Level of consciousness: awake and alert Pain management: pain level controlled Vital Signs Assessment: post-procedure vital signs reviewed and stable Respiratory status: spontaneous breathing, nonlabored ventilation and respiratory function stable Cardiovascular status: blood pressure returned to baseline and stable Postop Assessment: no apparent nausea or vomiting Anesthetic complications: no   No notable events documented.  Last Vitals:  Vitals:   07/08/22 1115 07/08/22 1130  BP: 116/70 109/67  Pulse: 64 64  Resp: 15 18  Temp:    SpO2: 98% 96%    Last Pain:  Vitals:   07/08/22 1050  TempSrc:   PainSc: Asleep                 Christena Sunderlin,W. EDMOND

## 2022-07-08 NOTE — Transfer of Care (Signed)
Immediate Anesthesia Transfer of Care Note  Patient: Cameron [name]  Procedure(s) Performed: TRANSURETHRAL RESECTION OF THE PROSTATE (TURP)/ CYSTOSCOPY  Patient Location: PACU  Anesthesia Type:General  Level of Consciousness: drowsy  Airway & Oxygen Therapy: Patient Spontanous Breathing and Patient connected to face mask oxygen  Post-op Assessment: Report given to RN and Post -op Vital signs reviewed and stable  Post vital signs: Reviewed and stable  Last Vitals:  Vitals Value Taken Time  BP 108/59 07/08/22 1047  Temp    Pulse 83 07/08/22 1049  Resp 11 07/08/22 1049  SpO2 97 % 07/08/22 1049  Vitals shown include unvalidated device data.  Last Pain:  Vitals:   07/08/22 0746  TempSrc: Oral         Complications: No notable events documented.

## 2022-07-08 NOTE — Op Note (Signed)
Preoperative diagnosis: Bladder outlet obstruction secondary to BPH  Postoperative diagnosis:  Bladder outlet obstruction secondary to BPH  Procedure:  Cystoscopy Transurethral resection of the prostate  Surgeon: Pryor Curia. M.D.  Anesthesia: General  Complications: None  EBL: Minimal  Specimens: Prostate chips  Disposition of specimens: Pathology  Indication: Colton Tassin Prestwood is a patient with bladder outlet obstruction secondary to benign prostatic hyperplasia. After reviewing the management options for treatment, he elected to proceed with the above surgical procedure(s). We have discussed the potential benefits and risks of the procedure, side effects of the proposed treatment, the likelihood of the patient achieving the goals of the procedure, and any potential problems that might occur during the procedure or recuperation. Informed consent has been obtained.  Description of procedure:  The patient was taken to the operating room and general anesthesia was induced.  The patient was placed in the dorsal lithotomy position, prepped and draped in the usual sterile fashion, and preoperative antibiotics were administered. A preoperative time-out was performed.   Cystourethroscopy was performed.  The patient's urethra was examined and demonstrated bilobar prostatic hypertrophy.   The bladder was then systematically examined in its entirety. There was no evidence of any bladder tumors, stones, or other mucosal pathology.  The ureteral orifices were identified and marked so as to be avoided during the procedure.  The prostate adenoma was then resected utilizing loop cautery resection with the bipolar cutting loop.  The prostate adenoma from the bladder neck back to the verumontanum was resected beginning at the six o'clock position and then extended to include the right and left lobes of the prostate and anterior prostate. Care was taken not to resect distal to the  verumontanum.  Hemostasis was then achieved with the cautery and the bladder was emptied and reinspected with no significant bleeding noted at the end of the procedure.    A 3 way catheter was then placed into the bladder and placed on continuous bladder irrigation.  The patient appeared to tolerate the procedure well and without complications.  The patient was able to be awakened and transferred to the recovery unit in satisfactory condition.

## 2022-07-08 NOTE — Anesthesia Preprocedure Evaluation (Addendum)
Anesthesia Evaluation  Patient identified by MRN, date of birth, ID band Patient awake    Reviewed: Allergy & Precautions, H&P , NPO status , Patient's Chart, lab work & pertinent test results  Airway Mallampati: II  TM Distance: >3 FB Neck ROM: Full    Dental no notable dental hx. (+) Partial Lower, Partial Upper, Poor Dentition, Dental Advisory Given   Pulmonary COPD,  COPD inhaler, Current Smoker and Patient abstained from smoking.,    Pulmonary exam normal breath sounds clear to auscultation       Cardiovascular hypertension, Pt. on medications  Rhythm:Regular Rate:Normal     Neuro/Psych negative neurological ROS  negative psych ROS   GI/Hepatic negative GI ROS, Neg liver ROS,   Endo/Other  negative endocrine ROS  Renal/GU negative Renal ROS  negative genitourinary   Musculoskeletal   Abdominal   Peds  Hematology negative hematology ROS (+)   Anesthesia Other Findings   Reproductive/Obstetrics negative OB ROS                            Anesthesia Physical Anesthesia Plan  ASA: 3  Anesthesia Plan: General   Post-op Pain Management: Tylenol PO (pre-op)*   Induction: Intravenous  PONV Risk Score and Plan: 2 and Ondansetron and Dexamethasone  Airway Management Planned: LMA  Additional Equipment:   Intra-op Plan:   Post-operative Plan: Extubation in OR  Informed Consent: I have reviewed the patients History and Physical, chart, labs and discussed the procedure including the risks, benefits and alternatives for the proposed anesthesia with the patient or authorized representative who has indicated his/her understanding and acceptance.     Dental advisory given  Plan Discussed with: CRNA  Anesthesia Plan Comments:        Anesthesia Quick Evaluation

## 2022-07-08 NOTE — Interval H&P Note (Signed)
History and Physical Interval Note:  07/08/2022 7:31 AM  Cameron [name]  has presented today for surgery, with the diagnosis of URINARY RETENTION/ BENIGN PROSTATIC HYPERPLASIA.  The various methods of treatment have been discussed with the patient and family. After consideration of risks, benefits and other options for treatment, the patient has consented to  Procedure(s): TRANSURETHRAL RESECTION OF THE PROSTATE (TURP)/ CYSTOSCOPY (N/A) as a surgical intervention.  The patient's history has been reviewed, patient examined, no change in status, stable for surgery.  I have reviewed the patient's chart and labs.  Questions were answered to the patient's satisfaction.     [employer]

## 2022-07-08 NOTE — Anesthesia Procedure Notes (Addendum)
Procedure Name: LMA Insertion Date/Time: 07/08/2022 9:49 AM  Performed by: Jenne Campus, CRNAPre-anesthesia Checklist: Patient identified, Emergency Drugs available, Suction available and Patient being monitored Patient Re-evaluated:Patient Re-evaluated prior to induction Oxygen Delivery Method: Circle System Utilized Preoxygenation: Pre-oxygenation with 100% oxygen Induction Type: IV induction Ventilation: Mask ventilation without difficulty LMA: LMA inserted LMA Size: 5.0 Number of attempts: 1 Airway Equipment and Method: Bite block Placement Confirmation: positive ETCO2 and breath sounds checked- equal and bilateral Tube secured with: Tape Dental Injury: Teeth and Oropharynx as per pre-operative assessment

## 2022-07-09 ENCOUNTER — Encounter (HOSPITAL_COMMUNITY): Payer: Self-pay | Admitting: Urology

## 2022-07-09 DIAGNOSIS — R69 Illness, unspecified: Secondary | ICD-10-CM | POA: Diagnosis not present

## 2022-07-09 DIAGNOSIS — R338 Other retention of urine: Secondary | ICD-10-CM | POA: Diagnosis not present

## 2022-07-09 DIAGNOSIS — N401 Enlarged prostate with lower urinary tract symptoms: Secondary | ICD-10-CM | POA: Diagnosis not present

## 2022-07-09 DIAGNOSIS — N323 Diverticulum of bladder: Secondary | ICD-10-CM | POA: Diagnosis not present

## 2022-07-09 DIAGNOSIS — I10 Essential (primary) hypertension: Secondary | ICD-10-CM | POA: Diagnosis not present

## 2022-07-09 DIAGNOSIS — Z7982 Long term (current) use of aspirin: Secondary | ICD-10-CM | POA: Diagnosis not present

## 2022-07-09 LAB — SURGICAL PATHOLOGY

## 2022-07-09 NOTE — TOC Progression Note (Signed)
Transition of Care [facility]) - Progression Note    Patient Details  Name: Cameron [name] MRN: [id number] Date of Birth: [birth year]  Transition of Care [facility]) CM/SW Contact  Henrietta Dine, RN Phone Number: 07/09/2022, 9:49 AM  Clinical Narrative:     Transition of Care [facility]) Screening Note   Patient Details  Name: Cameron [name] Date of Birth: June 19, 1951   Transition of Care [facility]) CM/SW Contact:    Henrietta Dine, RN Phone Number: 07/09/2022, 9:49 AM    Transition of Care Department [facility]) has reviewed patient and no TOC needs have been identified at this time. We will continue to monitor patient advancement through interdisciplinary progression rounds. If new patient transition needs arise, please place a TOC consult.          Expected Discharge Plan and Services                                                 Social Determinants of Health (SDOH) Interventions    Readmission Risk Interventions     No data to display

## 2022-07-09 NOTE — Progress Notes (Signed)
Mobility Specialist - Progress Note   07/09/22 1025  Mobility  Activity Ambulated independently in hallway  Level of Assistance Independent  Assistive Device None  Distance Ambulated (ft) 700 ft  Range of Motion/Exercises Active  Activity Response Tolerated well  $Mobility charge 1 Mobility   Pt was found in bed and agreeable to ambulate. Had no complaints and at EOS returned to bed with all necessities in reach.  Ferd Hibbs Mobility Specialist

## 2022-07-09 NOTE — Progress Notes (Signed)
Patient ID: Cameron [name], male   DOB: 07-31-1951, 71 y.o.   MRN: [id number]  1 Day Post-Op Subjective: No complaints overnight.  Objective: Vital signs in last 24 hours: Temp:  [97.4 F (36.3 C)-97.9 F (36.6 C)] 97.6 F (36.4 C) (10/13 0559) Pulse Rate:  [57-83] 70 (10/13 0559) Resp:  [11-20] 20 (10/13 0559) BP: (102-132)/(59-78) 132/78 (10/13 0559) SpO2:  [93 %-100 %] 98 % (10/13 0559) Weight:  [90.9 kg] 90.9 kg (10/12 0753)  Intake/Output from previous day: 10/12 0701 - 10/13 0700 In: 3811.5 [I.V.:1200; IV Piggyback:111.5] Out: 63016 [Urine:34540] Intake/Output this shift: Total I/O In: -  Out: 01093 [Urine:24450]  Physical Exam:  General: Alert and oriented GU: Urine red tinged with CBI running briskly (apparently increased at 2 AM  Lab Results:   Studies/Results: No results found.  Assessment/Plan: POD # 1 s/p TURP - Irrigated some small clots and restarted CBI at slow drip.  Will titrate off this morning hopefully and will re-evaluate for possible discharge later today   LOS: 0 days   Dutch Gray 07/09/2022, 6:59 AM

## 2022-07-09 NOTE — Plan of Care (Signed)
  Problem: Education: Goal: Knowledge of the prescribed therapeutic regimen will improve 07/09/2022 1336 by Lennie Hummer, RN Outcome: Adequate for Discharge 07/09/2022 1306 by Lennie Hummer, RN Outcome: Progressing   Problem: Bowel/Gastric: Goal: Gastrointestinal status for postoperative course will improve Outcome: Adequate for Discharge   Problem: Health Behavior/Discharge Planning: Goal: Identification of resources available to assist in meeting health care needs will improve Outcome: Adequate for Discharge   Problem: Skin Integrity: Goal: Demonstration of wound healing without infection will improve Outcome: Adequate for Discharge   Problem: Urinary Elimination: Goal: Ability to avoid or minimize complications of infection will improve Outcome: Adequate for Discharge   Problem: Education: Goal: Knowledge of General Education information will improve Description: Including pain rating scale, medication(s)/side effects and non-pharmacologic comfort measures Outcome: Adequate for Discharge   Problem: Health Behavior/Discharge Planning: Goal: Ability to manage health-related needs will improve Outcome: Adequate for Discharge   Problem: Clinical Measurements: Goal: Ability to maintain clinical measurements within normal limits will improve 07/09/2022 1336 by Lennie Hummer, RN Outcome: Adequate for Discharge 07/09/2022 1306 by Lennie Hummer, RN Outcome: Adequate for Discharge Goal: Will remain free from infection Outcome: Adequate for Discharge Goal: Diagnostic test results will improve 07/09/2022 1336 by Lennie Hummer, RN Outcome: Adequate for Discharge 07/09/2022 1306 by Lennie Hummer, RN Outcome: Progressing Goal: Respiratory complications will improve Outcome: Adequate for Discharge Goal: Cardiovascular complication will be avoided Outcome: Adequate for Discharge   Problem: Activity: Goal: Risk for activity intolerance will decrease Outcome: Adequate  for Discharge   Problem: Nutrition: Goal: Adequate nutrition will be maintained Outcome: Adequate for Discharge   Problem: Coping: Goal: Level of anxiety will decrease Outcome: Adequate for Discharge   Problem: Elimination: Goal: Will not experience complications related to bowel motility Outcome: Adequate for Discharge Goal: Will not experience complications related to urinary retention Outcome: Adequate for Discharge   Problem: Pain Managment: Goal: General experience of comfort will improve Outcome: Adequate for Discharge   Problem: Safety: Goal: Ability to remain free from injury will improve Outcome: Adequate for Discharge   Problem: Skin Integrity: Goal: Risk for impaired skin integrity will decrease Outcome: Adequate for Discharge

## 2022-07-09 NOTE — Discharge Summary (Signed)
  Date of admission: 07/08/2022  Date of discharge: 07/09/2022  Admission diagnosis: Urinary retention, BPH  Discharge diagnosis: Urinary retention, BPH  Secondary diagnoses: Hypertension, hyperlipidemia  History and Physical: For full details, please see admission history and physical. Briefly, Cameron [name] is a 71 y.o. year old patient with urinary retention secondary to BPH.   Hospital Course: He underwent cystoscopy and TURP on 07/08/22.  CBI was administered overnight and was stopped on POD #1.  He was discharged with his catheter on POD # 1.  Laboratory values: No results for input(s): "HGB", "HCT" in the last 72 hours. No results for input(s): "CREATININE" in the last 72 hours.  Disposition: Home  Discharge instruction: The patient was instructed to be ambulatory but told to refrain from heavy lifting, strenuous activity, or driving.   Discharge medications:  Allergies as of 07/09/2022   Not on File      Medication List     STOP taking these medications    aspirin 81 MG tablet   tamsulosin 0.4 MG Caps capsule Commonly known as: FLOMAX       TAKE these medications    albuterol 108 (90 Base) MCG/ACT inhaler Commonly known as: VENTOLIN HFA Inhale 2 puffs into the lungs every 6 (six) hours as needed for wheezing or shortness of breath.   atorvastatin 20 MG tablet Commonly known as: LIPITOR Take 1 tablet (20 mg total) by mouth daily.   benazepril 40 MG tablet Commonly known as: LOTENSIN Take 1 tablet (40 mg total) by mouth daily.   hydrochlorothiazide 12.5 MG capsule Commonly known as: MICROZIDE Take 1 capsule (12.5 mg total) by mouth daily.   sildenafil 50 MG tablet Commonly known as: Viagra Take 0.5-2 tablets (25-100 mg total) by mouth daily as needed for erectile dysfunction.   vitamin C 1000 MG tablet Take 1,000 mg by mouth daily.        Followup:   Follow-up Information     Raynelle Bring, MD Follow up.   Specialty: Urology Why: 08/03/22  at 9:15 AM Contact information: [place] [address] [phone]         [facility] Follow up.   Why: 07/13/22 for a voiding trial at 9:15 AM with Jiles Crocker, NP Contact information: [place] [phone]

## 2022-07-09 NOTE — Plan of Care (Signed)
  Problem: Education: Goal: Knowledge of the prescribed therapeutic regimen will improve Outcome: Progressing   Problem: Clinical Measurements: Goal: Diagnostic test results will improve Outcome: Progressing   Problem: Clinical Measurements: Goal: Ability to maintain clinical measurements within normal limits will improve Outcome: Adequate for Discharge

## 2022-07-13 ENCOUNTER — Telehealth: Payer: Self-pay | Admitting: Nurse Practitioner

## 2022-07-13 NOTE — Telephone Encounter (Signed)
Patient req CB 09/2022 due having surgery

## 2022-07-19 DIAGNOSIS — R338 Other retention of urine: Secondary | ICD-10-CM | POA: Diagnosis not present

## 2022-08-03 DIAGNOSIS — N323 Diverticulum of bladder: Secondary | ICD-10-CM | POA: Diagnosis not present

## 2022-08-03 DIAGNOSIS — R8271 Bacteriuria: Secondary | ICD-10-CM | POA: Diagnosis not present

## 2022-08-11 ENCOUNTER — Other Ambulatory Visit: Payer: Self-pay | Admitting: Urology

## 2022-08-24 ENCOUNTER — Ambulatory Visit (INDEPENDENT_AMBULATORY_CARE_PROVIDER_SITE_OTHER): Payer: Medicare HMO

## 2022-08-24 DIAGNOSIS — Z23 Encounter for immunization: Secondary | ICD-10-CM

## 2022-08-25 ENCOUNTER — Ambulatory Visit: Payer: Medicare HMO

## 2022-08-27 ENCOUNTER — Ambulatory Visit: Payer: Medicare HMO | Admitting: Nurse Practitioner

## 2022-09-02 DIAGNOSIS — M6281 Muscle weakness (generalized): Secondary | ICD-10-CM | POA: Diagnosis not present

## 2022-09-02 DIAGNOSIS — R350 Frequency of micturition: Secondary | ICD-10-CM | POA: Diagnosis not present

## 2022-09-07 DIAGNOSIS — R8279 Other abnormal findings on microbiological examination of urine: Secondary | ICD-10-CM | POA: Diagnosis not present

## 2022-09-07 DIAGNOSIS — R338 Other retention of urine: Secondary | ICD-10-CM | POA: Diagnosis not present

## 2022-09-20 NOTE — Patient Instructions (Addendum)
SURGICAL WAITING ROOM VISITATION Patients having surgery or a procedure may have no more than 2 support people in the waiting area - these visitors may rotate in the visitor waiting room.   Due to an increase in RSV and influenza rates and associated hospitalizations, children ages 24 and under may not visit patients in [place]. If the patient needs to stay at the hospital during part of their recovery, the visitor guidelines for inpatient rooms apply.  PRE-OP VISITATION  Pre-op nurse will coordinate an appropriate time for 1 support person to accompany the patient in pre-op.  This support person may not rotate.  This visitor will be contacted when the time is appropriate for the visitor to come back in the pre-op area.  Please refer to the [facility] website for the visitor guidelines for Inpatients (after your surgery is over and you are in a regular room).  You are not required to quarantine at this time prior to your surgery. However, you must do this: Hand Hygiene often Do NOT share personal items Notify your provider if you are in close contact with someone who has COVID or you develop fever 100.4 or greater, new onset of sneezing, cough, sore throat, shortness of breath or body aches.  If you test positive for Covid or have been in contact with anyone that has tested positive in the last 10 days please notify you surgeon.    Your procedure is scheduled on:  Thursday  September 30, 2022  Report to [facility] Main Entrance: [address] entrance where the [employer] is available.   Report to admitting at: 09:00 AM  +++++Call this number if you have any questions or problems the morning of surgery [phone]  DO NOT EAT OR DRINK ANYTHING AFTER MIDNIGHT THE NIGHT PRIOR TO YOUR SURGERY / PROCEDURE.   FOLLOW BOWEL PREP AND ANY ADDITIONAL PRE OP INSTRUCTIONS YOU RECEIVED FROM YOUR SURGEON'S OFFICE!!!  Magnesium Citrate -DRINK 8 OUNCES OF MAGNESIUM CITRATE AT NOON  THE DAY BEFORE SURGERY.  Fleet Enema - use 1 Fleet enema the night before surgery.     Oral Hygiene is also important to reduce your risk of infection.        Remember - BRUSH YOUR TEETH THE MORNING OF SURGERY WITH YOUR REGULAR TOOTHPASTE  Do NOT smoke after Midnight the night before surgery.  Take ONLY these medicines the morning of surgery with A SIP OF WATER: None,  You may use your Albuterol Inhaler if needed  You may not have any metal on your body including jewelry, and body piercing  Do not wear  lotions, powders, cologne, or deodorant  Men may shave face and neck.  Contacts, Hearing Aids, dentures or bridgework may not be worn into surgery. DENTURES WILL BE REMOVED PRIOR TO SURGERY PLEASE DO NOT APPLY "Poly grip" OR ADHESIVES!!!  You may bring a small overnight bag with you on the day of surgery, only pack items that are not valuable. [facility] IS NOT RESPONSIBLE   FOR VALUABLES THAT ARE LOST OR STOLEN.   Do not bring your home medications to the hospital. The Pharmacy will dispense medications listed on your medication list to you during your admission in the Hospital.  Please read over the following fact sheets you were given: IF YOU HAVE QUESTIONS ABOUT YOUR PRE-OP INSTRUCTIONS, PLEASE CALL [phone]  ([place])   [facility] - Preparing for Surgery Before surgery, you can play an important role.  Because skin is not sterile, your  skin needs to be as free of germs as possible.  You can reduce the number of germs on your skin by washing with CHG (chlorahexidine gluconate) soap before surgery.  CHG is an antiseptic cleaner which kills germs and bonds with the skin to continue killing germs even after washing. Please DO NOT use if you have an allergy to CHG or antibacterial soaps.  If your skin becomes reddened/irritated stop using the CHG and inform your nurse when you arrive at Short Stay. Do not shave (including legs and underarms) for at least 48 hours prior to the first CHG  shower.  You may shave your face/neck.  Please follow these instructions carefully:  1.  Shower with CHG Soap the night before surgery and the  morning of surgery.  2.  If you choose to wash your hair, wash your hair first as usual with your normal  shampoo.  3.  After you shampoo, rinse your hair and body thoroughly to remove the shampoo.                             4.  Use CHG as you would any other liquid soap.  You can apply chg directly to the skin and wash.  Gently with a scrungie or clean washcloth.  5.  Apply the CHG Soap to your body ONLY FROM THE NECK DOWN.   Do not use on face/ open                           Wound or open sores. Avoid contact with eyes, ears mouth and genitals (private parts).                       Wash face,  Genitals (private parts) with your normal soap.             6.  Wash thoroughly, paying special attention to the area where your  surgery  will be performed.  7.  Thoroughly rinse your body with warm water from the neck down.  8.  DO NOT shower/wash with your normal soap after using and rinsing off the CHG Soap.            9.  Pat yourself dry with a clean towel.            10.  Wear clean pajamas.            11.  Place clean sheets on your bed the night of your first shower and do not  sleep with pets.  ON THE DAY OF SURGERY : Do not apply any lotions/deodorants the morning of surgery.  Please wear clean clothes to the hospital/surgery center.    FAILURE TO FOLLOW THESE INSTRUCTIONS MAY RESULT IN THE CANCELLATION OF YOUR SURGERY  PATIENT SIGNATURE_________________________________  NURSE SIGNATURE__________________________________  ________________________________________________________________________

## 2022-09-20 NOTE — Progress Notes (Signed)
COVID Vaccine received:  _0  No _1  Yes Date of any COVID positive Test in last 90 days:  PCP - Karl Ito NP Cardiologist - Inocente Salles, MD    LOV 03-31-2018  Chest x-ray -  EKG -  07-02-2022 Stress Test - 04-13-2018 ECHO -  Cardiac Cath -   PCR screen: _2  Ordered & Completed                       _3   No Order but Needs PROFEND                       _4   N/A for this surgery  Surgery Plan:  _5  Ambulatory                            _6  Outpatient in bed                            _7  Admit  Anesthesia:    _8  General  _9  Spinal                           _10   Choice _11   MAC  Bowel Prep - _12  No  _13   Yes __mag citrate & 1 Fleet enema___________  Pacemaker / ICD device _14  No _15  Yes        Device order form faxed _16  No    _17   Yes      Faxed to:  Spinal Cord Stimulator:_18  No _19  Yes      (Remind patient to bring remote DOS) Other Implants:   History of Sleep Apnea? _20  No _21  Yes   CPAP used?- _22  No _23  Yes    Does the patient monitor blood sugar? _24  No _25  Yes  _26  N/A  Blood Thinner / Instructions:none Aspirin Instructions:none  ERAS Protocol Ordered: _27  No  _28  Yes PRE-SURGERY _29  ENSURE  _30  G2   Patient is to be NPO after: Midnight prior  Comments:   Activity level: Patient can / can not climb a flight of stairs without difficulty; _31  No CP  _32  No SOB, but would have ______   Patient can / can not perform ADLs without assistance.   Anesthesia review: HTN, CAD, Smoker, recent TURP on 07-08-2022  Patient denies shortness of breath, fever, cough and chest pain at PAT appointment.  Patient verbalized understanding and agreement to the Pre-Surgical Instructions that were given to them at this PAT appointment. Patient was also educated of the need to review these PAT instructions again prior to his/her surgery.I reviewed the appropriate phone numbers to call if they have any and questions or concerns.

## 2022-09-22 ENCOUNTER — Other Ambulatory Visit: Payer: Self-pay

## 2022-09-22 ENCOUNTER — Encounter (HOSPITAL_COMMUNITY): Payer: Self-pay

## 2022-09-22 ENCOUNTER — Encounter (HOSPITAL_COMMUNITY)
Admission: RE | Admit: 2022-09-22 | Discharge: 2022-09-22 | Disposition: A | Discharge status: Home / Self Care | Payer: Medicare HMO | Source: Ambulatory Visit | Attending: Urology | Admitting: Urology

## 2022-09-22 DIAGNOSIS — Z01818 Encounter for other preprocedural examination: Secondary | ICD-10-CM | POA: Diagnosis not present

## 2022-09-22 HISTORY — DX: Malignant (primary) neoplasm, unspecified: C80.1

## 2022-09-22 LAB — CBC
HCT: 48.2 % (ref 39.0–52.0)
Hemoglobin: 16 g/dL (ref 13.0–17.0)
MCH: 31.2 pg (ref 26.0–34.0)
MCHC: 33.2 g/dL (ref 30.0–36.0)
MCV: 94 fL (ref 80.0–100.0)
Platelets: 236 10*3/uL (ref 150–400)
RBC: 5.13 MIL/uL (ref 4.22–5.81)
RDW: 13.4 % (ref 11.5–15.5)
WBC: 7 10*3/uL (ref 4.0–10.5)
nRBC: 0 % (ref 0.0–0.2)

## 2022-09-22 LAB — BASIC METABOLIC PANEL
Anion gap: 7 (ref 5–15)
BUN: 10 mg/dL (ref 8–23)
CO2: 27 mmol/L (ref 22–32)
Calcium: 8.9 mg/dL (ref 8.9–10.3)
Chloride: 103 mmol/L (ref 98–111)
Creatinine, Ser: 0.72 mg/dL (ref 0.61–1.24)
GFR, Estimated: >60 mL/min (ref >60)
Glucose, Bld: 116 mg/dL — ABNORMAL HIGH (ref 70–99)
Potassium: 4.2 mmol/L (ref 3.5–5.1)
Sodium: 137 mmol/L (ref 135–145)

## 2022-09-29 LAB — TYPE AND SCREEN
ABO/RH(D): O POS
Antibody Screen: NEGATIVE

## 2022-09-29 NOTE — H&P (Signed)
Office Visit Report     09/07/2022   --------------------------------------------------------------------------------   Cameron [name]  MRN: [id number]  DOB: [birth year], 72 year old Male  SSN:    PRIMARY CARE:     REFERRING:  Derrek Monaco, M  PROVIDER:  Raynelle Bring, M.D.  LOCATION:  [facility] [phone]     --------------------------------------------------------------------------------   CC/HPI: 1. Prostate cancer  2. BPH with bladder outlet obstruction  3. Bladder diverticulum   Cameron [name] is a 72 year old gentleman who presented to me with urinary retention due to BPH. He was also noted to have a large right sided bladder diverticulum. His PSA this year was 0.98. After reviewing options for management, he elected to proceed with initial TURP and then to re-evaluate treatment of his diverticulum which had been asymptomatic and more of an incidental finding. He underwent TURP on 07/08/22 and was able to successfully void postoperatively. Unfortunately, his pathology indicated 6 grams of tissue with 5% involvement of Gleason 3+4=7 adenocarcinoma. After discussing this incidental finding of favorable intermediate risk prostate cancer, he elected to proceed with definitive surgical treatment with robotic radical prostatectomy and BPLND. In addition, we talked about concomitant bladder diverticulectomy and he did wish to proceed with this as well.   Interval history:   Cameron [name] returns today for further management of his large bladder diverticulum, BPH, and prostate cancer. He has elected to proceed with surgical treatment for his bladder diverticulum and prostate cancer. He remains in stable overall health. He completed his course of antibiotics after his last visit. IPSS is 14. SHIM score is 10. He does not respond well to PDE 5 inhibitors at this time.     ALLERGIES: No Known Drug Allergies    MEDICATIONS: Aspirin  Hydrochlorothiazide 12.5 mg tablet  Tamsulosin Hcl  0.4 mg capsule  Atorvastatin Calcium 20 mg tablet  Benazepril Hcl 40 mg tablet  Sildenafil Citrate 50 mg tablet  Vitamin C     GU PSH: Complex cystometrogram, w/ void pressure and urethral pressure profile studies, any technique - 05/21/2022 Complex Uroflow - 05/21/2022 Cystoscopy TURP - 07/08/2022 Emg surf Electrd - 05/21/2022 Inject For cystogram - 05/21/2022 Intrabd voidng Press - 05/21/2022     NON-GU PSH: Appendectomy (laparoscopic) Cholecystectomy (laparoscopic)     GU PMH: Urinary Frequency - 09/02/2022 Bladder Diverticulum - 08/03/2022, - 07/19/2022, - 06/15/2022, - 06/03/2022, - 05/28/2022, - 05/12/2022 Prostate Cancer - 08/03/2022, - 07/19/2022 BPH w/LUTS - 07/19/2022, - 07/13/2022, - 06/15/2022 Urinary Retention - 07/19/2022, - 07/13/2022, - 06/15/2022, - 05/28/2022, - 05/21/2022, - 05/12/2022 Urinary Retention, Unspec - 06/03/2022 Hydronephrosis - 05/28/2022, - 05/12/2022    NON-GU PMH: Muscle weakness (generalized) - 09/02/2022 Bacteriuria - 08/03/2022 Anxiety Hypercholesterolemia Hypertension    FAMILY HISTORY: 2 daughters - Other Congestive Heart Failure - Father Diabetes - Mother Emphysema - Father   SOCIAL HISTORY: Marital Status: Widowed Preferred Language: English; Race: White Current Smoking Status: Patient smokes.   Tobacco Use Assessment Completed: Used Tobacco in last 30 days? Drinks 4+ caffeinated drinks per day.    REVIEW OF SYSTEMS:    GU Review Male:   Patient denies frequent urination, hard to postpone urination, burning/ pain with urination, get up at night to urinate, leakage of urine, stream starts and stops, trouble starting your streams, and have to strain to urinate .  Gastrointestinal (Lower):   Patient denies diarrhea and constipation.  Gastrointestinal (Upper):   Patient denies nausea and vomiting.  Constitutional:   Patient denies fever, night  sweats, weight loss, and fatigue.  Skin:   Patient denies itching and skin rash/ lesion.  Eyes:   Patient  denies blurred vision and double vision.  Ears/ Nose/ Throat:   Patient denies sore throat and sinus problems.  Hematologic/Lymphatic:   Patient denies swollen glands and easy bruising.  Cardiovascular:   Patient denies leg swelling and chest pains.  Respiratory:   Patient denies cough and shortness of breath.  Endocrine:   Patient denies excessive thirst.  Musculoskeletal:   Patient denies back pain and joint pain.  Neurological:   Patient denies headaches and dizziness.  Psychologic:   Patient denies depression and anxiety.   VITAL SIGNS:      09/07/2022 11:19 AM  Weight 202 lb / 91.63 kg  Height 70 in / 177.8 cm  BP 141/73 mmHg  Pulse 68 /min  BMI 29.0 kg/m   MULTI-SYSTEM PHYSICAL EXAMINATION:    Constitutional: Well-nourished. No physical deformities. Normally developed. Good grooming.  Respiratory: No labored breathing, no use of accessory muscles. Clear bilaterally.  Cardiovascular: Normal temperature, normal extremity pulses, no swelling, no varicosities. Regular rate and rhythm.  Neurologic / Psychiatric: Oriented to time, oriented to place, oriented to person. No depression, no anxiety, no agitation.  Gastrointestinal: No mass, no tenderness, no rigidity, non obese abdomen.     Complexity of Data:  Records Review:   Pathology Reports, Previous Patient Records   PROCEDURES:         PVR Ultrasound - 76160  Notes: 2nd reading 517  Scanned Volume: 999 cc         Urinalysis w/Scope - 81001 Dipstick Dipstick Cont'd Micro  Color: Yellow Bilirubin: Neg mg/dL WBC/hpf: 6 - 10/hpf  Appearance: Slightly Cloudy Ketones: Neg mg/dL RBC/hpf: 0 - 2/hpf  Specific Gravity: 1.015 Blood: Neg ery/uL Bacteria: NS (Not Seen)  pH: 6.0 Protein: Neg mg/dL Cystals: NS (Not Seen)  Glucose: Neg mg/dL Urobilinogen: 0.2 mg/dL Casts: NS (Not Seen)    Nitrites: Neg Trichomonas: Not Present    Leukocyte Esterase: 1+ leu/uL Mucous: Not Present      Epithelial Cells: 0 - 5/hpf      Yeast: NS (Not  Seen)      Sperm: Not Present    ASSESSMENT:      ICD-10 Details  1 GU:   Prostate Cancer - C61   2   BPH w/LUTS - N40.1   3   Urinary Retention - R33.8   4   Bladder Diverticulum - N32.3    PLAN:           Orders Labs Urine Culture          Schedule Return Visit/Planned Activity: Keep Scheduled Appointment          Document Letter(s):  Created for Patient: Clinical Summary         Notes:   1. Favorable intermediate risk prostate cancer: He confirms his decision that he wishes to proceed with surgical treatment of his prostate cancer. We discussed surgical therapy for prostate cancer including the different available surgical approaches. We discussed, in detail, the risks and expectations of surgery with regard to cancer control, urinary control, and erectile function as well as the expected postoperative recovery process. Additional risks of surgery including but not limited to bleeding, infection, hernia formation, nerve damage, lymphocele formation, bowel/rectal injury potentially necessitating colostomy, damage to the urinary tract resulting in urine leakage, urethral stricture, and the cardiopulmonary risks such as myocardial infarction, stroke, death, venothromboembolism, etc. were explained. The  risk of open surgical conversion for robotic/laparoscopic prostatectomy was also discussed.   2. Bladder diverticulum: We will plan to perform a diverticulectomy at the time of his upcoming prostatectomy. His urine has been cultured today as a precaution.   He is scheduled to proceed with a robot-assisted laparoscopic radical prostatectomy and bilateral pelvic lymphadenectomy and bladder diverticulectomy.           Next Appointment:      Next Appointment: 09/30/2022 11:30 AM    Appointment Type: Surgery     Location: [facility] [phone]    Provider: Raynelle Bring, M.D.    Reason for Visit: WL/OBS RA LAP RAD PROSTATECTOMY, BPLA, BLADDER DIVERTICULITIS       E & M CODES: We spent 42 minutes dedicated to evaluation and management time, including face to face interaction, discussions on coordination of care, documentation, result review, and discussion with others as applicable.     * Signed by Raynelle Bring, M.D. on 09/07/22 at 5:55 PM (EST*

## 2022-09-30 ENCOUNTER — Ambulatory Visit (HOSPITAL_BASED_OUTPATIENT_CLINIC_OR_DEPARTMENT_OTHER): Payer: HMO | Admitting: Anesthesiology

## 2022-09-30 ENCOUNTER — Observation Stay (HOSPITAL_COMMUNITY)
Admission: RE | Admit: 2022-09-30 | Discharge: 2022-10-01 | Disposition: A | Discharge status: Home / Self Care | Payer: HMO | Attending: Urology | Admitting: Urology

## 2022-09-30 ENCOUNTER — Encounter (HOSPITAL_COMMUNITY): Payer: Self-pay | Admitting: Urology

## 2022-09-30 ENCOUNTER — Encounter (HOSPITAL_COMMUNITY)
Admission: RE | Disposition: A | Discharge status: Home / Self Care | Payer: Self-pay | Source: Home / Self Care | Attending: Urology

## 2022-09-30 ENCOUNTER — Ambulatory Visit (HOSPITAL_COMMUNITY): Payer: HMO | Admitting: Anesthesiology

## 2022-09-30 ENCOUNTER — Other Ambulatory Visit: Payer: Self-pay

## 2022-09-30 DIAGNOSIS — N323 Diverticulum of bladder: Secondary | ICD-10-CM | POA: Insufficient documentation

## 2022-09-30 DIAGNOSIS — C61 Malignant neoplasm of prostate: Secondary | ICD-10-CM | POA: Diagnosis not present

## 2022-09-30 DIAGNOSIS — F17200 Nicotine dependence, unspecified, uncomplicated: Secondary | ICD-10-CM | POA: Insufficient documentation

## 2022-09-30 DIAGNOSIS — Z79899 Other long term (current) drug therapy: Secondary | ICD-10-CM | POA: Diagnosis not present

## 2022-09-30 DIAGNOSIS — I10 Essential (primary) hypertension: Secondary | ICD-10-CM | POA: Diagnosis not present

## 2022-09-30 DIAGNOSIS — N401 Enlarged prostate with lower urinary tract symptoms: Secondary | ICD-10-CM | POA: Insufficient documentation

## 2022-09-30 DIAGNOSIS — R338 Other retention of urine: Secondary | ICD-10-CM | POA: Insufficient documentation

## 2022-09-30 DIAGNOSIS — Z7982 Long term (current) use of aspirin: Secondary | ICD-10-CM | POA: Diagnosis not present

## 2022-09-30 DIAGNOSIS — F17210 Nicotine dependence, cigarettes, uncomplicated: Secondary | ICD-10-CM | POA: Diagnosis not present

## 2022-09-30 DIAGNOSIS — J449 Chronic obstructive pulmonary disease, unspecified: Secondary | ICD-10-CM | POA: Diagnosis not present

## 2022-09-30 DIAGNOSIS — D360 Benign neoplasm of lymph nodes: Secondary | ICD-10-CM | POA: Insufficient documentation

## 2022-09-30 HISTORY — PX: ROBOT ASSISTED LAPAROSCOPIC RADICAL PROSTATECTOMY: SHX5141

## 2022-09-30 HISTORY — PX: CYSTOSCOPY: SHX5120

## 2022-09-30 HISTORY — PX: BLADDER DIVERTICULECTOMY: SHX1235

## 2022-09-30 LAB — ABO/RH: ABO/RH(D): O POS

## 2022-09-30 LAB — HEMOGLOBIN AND HEMATOCRIT, BLOOD
HCT: 50.6 % (ref 39.0–52.0)
Hemoglobin: 16.4 g/dL (ref 13.0–17.0)

## 2022-09-30 SURGERY — XI ROBOTIC ASSISTED LAPAROSCOPIC RADICAL PROSTATECTOMY LEVEL 3
Anesthesia: General

## 2022-09-30 MED ORDER — PROMETHAZINE HCL 25 MG/ML IJ SOLN
INTRAMUSCULAR | Status: AC
  Filled 2022-09-30: qty 1

## 2022-09-30 MED ORDER — CHLORHEXIDINE GLUCONATE 0.12 % MT SOLN
15.0000 mL | Freq: Once | OROMUCOSAL | Status: AC
  Administered 2022-09-30: 15 mL via OROMUCOSAL

## 2022-09-30 MED ORDER — PROPOFOL 10 MG/ML IV BOLUS
INTRAVENOUS | Status: AC
  Filled 2022-09-30: qty 20

## 2022-09-30 MED ORDER — ROCURONIUM BROMIDE 10 MG/ML (PF) SYRINGE
PREFILLED_SYRINGE | INTRAVENOUS | Status: AC
  Filled 2022-09-30: qty 10

## 2022-09-30 MED ORDER — HYDROCHLOROTHIAZIDE 12.5 MG PO TABS
12.5000 mg | ORAL_TABLET | Freq: Every day | ORAL | Status: DC
  Administered 2022-10-01: 12.5 mg via ORAL
  Filled 2022-09-30: qty 1

## 2022-09-30 MED ORDER — KETAMINE HCL 10 MG/ML IJ SOLN
INTRAMUSCULAR | Status: DC | PRN
  Administered 2022-09-30: 30 mg via INTRAVENOUS

## 2022-09-30 MED ORDER — HYDROMORPHONE HCL 1 MG/ML IJ SOLN
INTRAMUSCULAR | Status: AC
  Administered 2022-09-30: 0.5 mg via INTRAVENOUS
  Filled 2022-09-30: qty 1

## 2022-09-30 MED ORDER — ORAL CARE MOUTH RINSE: 15.0000 mL | Freq: Once | OROMUCOSAL | Status: AC

## 2022-09-30 MED ORDER — DEXAMETHASONE SODIUM PHOSPHATE 10 MG/ML IJ SOLN
INTRAMUSCULAR | Status: DC | PRN
  Administered 2022-09-30: 10 mg via INTRAVENOUS

## 2022-09-30 MED ORDER — MEPERIDINE HCL 50 MG/ML IJ SOLN: 6.2500 mg | INTRAMUSCULAR | Status: DC | PRN

## 2022-09-30 MED ORDER — STERILE WATER FOR IRRIGATION IR SOLN
Status: DC | PRN
  Administered 2022-09-30: 1000 mL

## 2022-09-30 MED ORDER — TRAMADOL HCL 50 MG PO TABS: 50.0000 mg | ORAL_TABLET | Freq: Four times a day (QID) | ORAL | 0 refills | Status: DC | PRN

## 2022-09-30 MED ORDER — PHENYLEPHRINE 80 MCG/ML (10ML) SYRINGE FOR IV PUSH (FOR BLOOD PRESSURE SUPPORT)
PREFILLED_SYRINGE | INTRAVENOUS | Status: AC
  Filled 2022-09-30: qty 10

## 2022-09-30 MED ORDER — DIPHENHYDRAMINE HCL 12.5 MG/5ML PO ELIX: 12.5000 mg | ORAL_SOLUTION | Freq: Four times a day (QID) | ORAL | Status: DC | PRN

## 2022-09-30 MED ORDER — BUPIVACAINE HCL 0.25 % IJ SOLN
INTRAMUSCULAR | Status: AC
  Filled 2022-09-30: qty 1

## 2022-09-30 MED ORDER — ONDANSETRON HCL 4 MG/2ML IJ SOLN
INTRAMUSCULAR | Status: DC | PRN
  Administered 2022-09-30: 4 mg via INTRAVENOUS

## 2022-09-30 MED ORDER — BUPIVACAINE-EPINEPHRINE (PF) 0.25% -1:200000 IJ SOLN
INTRAMUSCULAR | Status: DC | PRN
  Administered 2022-09-30: 30 mL

## 2022-09-30 MED ORDER — HYDROMORPHONE HCL 2 MG/ML IJ SOLN
INTRAMUSCULAR | Status: AC
  Filled 2022-09-30: qty 1

## 2022-09-30 MED ORDER — CEFAZOLIN SODIUM-DEXTROSE 1-4 GM/50ML-% IV SOLN
1.0000 g | Freq: Three times a day (TID) | INTRAVENOUS | Status: AC
  Administered 2022-09-30 – 2022-10-01 (×2): 1 g via INTRAVENOUS
  Filled 2022-09-30 (×2): qty 50

## 2022-09-30 MED ORDER — CEFAZOLIN SODIUM-DEXTROSE 2-4 GM/100ML-% IV SOLN
2.0000 g | INTRAVENOUS | Status: AC
  Administered 2022-09-30: 2 g via INTRAVENOUS
  Filled 2022-09-30: qty 100

## 2022-09-30 MED ORDER — LACTATED RINGERS IV SOLN: INTRAVENOUS | Status: DC

## 2022-09-30 MED ORDER — SULFAMETHOXAZOLE-TRIMETHOPRIM 800-160 MG PO TABS: 1.0000 | ORAL_TABLET | Freq: Two times a day (BID) | ORAL | 0 refills | Status: DC

## 2022-09-30 MED ORDER — MORPHINE SULFATE (PF) 2 MG/ML IV SOLN: 2.0000 mg | INTRAVENOUS | Status: DC | PRN

## 2022-09-30 MED ORDER — ROCURONIUM BROMIDE 100 MG/10ML IV SOLN
INTRAVENOUS | Status: DC | PRN
  Administered 2022-09-30: 70 mg via INTRAVENOUS
  Administered 2022-09-30: 20 mg via INTRAVENOUS
  Administered 2022-09-30: 40 mg via INTRAVENOUS

## 2022-09-30 MED ORDER — ONDANSETRON HCL 4 MG/2ML IJ SOLN: 4.0000 mg | INTRAMUSCULAR | Status: DC | PRN

## 2022-09-30 MED ORDER — HEPARIN SODIUM (PORCINE) 1000 UNIT/ML IJ SOLN
INTRAMUSCULAR | Status: AC
  Filled 2022-09-30: qty 1

## 2022-09-30 MED ORDER — HYOSCYAMINE SULFATE 0.125 MG SL SUBL: 0.1250 mg | SUBLINGUAL_TABLET | Freq: Four times a day (QID) | SUBLINGUAL | Status: DC | PRN

## 2022-09-30 MED ORDER — SODIUM CHLORIDE 0.9 % IV BOLUS
1000.0000 mL | Freq: Once | INTRAVENOUS | Status: AC
  Administered 2022-09-30: 1000 mL via INTRAVENOUS

## 2022-09-30 MED ORDER — SODIUM CHLORIDE 0.9 % IR SOLN
Status: DC | PRN
  Administered 2022-09-30: 2000 mL via INTRAVESICAL

## 2022-09-30 MED ORDER — HYDROMORPHONE HCL 1 MG/ML IJ SOLN
0.2500 mg | INTRAMUSCULAR | Status: DC | PRN
  Administered 2022-09-30 (×2): 0.5 mg via INTRAVENOUS

## 2022-09-30 MED ORDER — MIDAZOLAM HCL 2 MG/2ML IJ SOLN
INTRAMUSCULAR | Status: AC
  Filled 2022-09-30: qty 2

## 2022-09-30 MED ORDER — LIDOCAINE HCL (CARDIAC) PF 100 MG/5ML IV SOSY
PREFILLED_SYRINGE | INTRAVENOUS | Status: DC | PRN
  Administered 2022-09-30: 100 mg via INTRAVENOUS

## 2022-09-30 MED ORDER — MIDAZOLAM HCL 2 MG/2ML IJ SOLN
INTRAMUSCULAR | Status: AC
  Administered 2022-09-30: 1 mg via INTRAVENOUS
  Filled 2022-09-30: qty 2

## 2022-09-30 MED ORDER — ACETAMINOPHEN 325 MG PO TABS: 650.0000 mg | ORAL_TABLET | ORAL | Status: DC | PRN

## 2022-09-30 MED ORDER — KETOROLAC TROMETHAMINE 15 MG/ML IJ SOLN
15.0000 mg | Freq: Four times a day (QID) | INTRAMUSCULAR | Status: DC
  Administered 2022-09-30 – 2022-10-01 (×3): 15 mg via INTRAVENOUS
  Filled 2022-09-30 (×3): qty 1

## 2022-09-30 MED ORDER — ATORVASTATIN CALCIUM 20 MG PO TABS
20.0000 mg | ORAL_TABLET | Freq: Every day | ORAL | Status: DC
  Administered 2022-09-30 – 2022-10-01 (×2): 20 mg via ORAL
  Filled 2022-09-30 (×2): qty 1

## 2022-09-30 MED ORDER — KETAMINE HCL 10 MG/ML IJ SOLN
INTRAMUSCULAR | Status: AC
  Filled 2022-09-30: qty 1

## 2022-09-30 MED ORDER — BENAZEPRIL HCL 20 MG PO TABS
40.0000 mg | ORAL_TABLET | Freq: Every day | ORAL | Status: DC
  Administered 2022-10-01: 40 mg via ORAL
  Filled 2022-09-30: qty 2

## 2022-09-30 MED ORDER — LABETALOL HCL 5 MG/ML IV SOLN
INTRAVENOUS | Status: AC
  Filled 2022-09-30: qty 4

## 2022-09-30 MED ORDER — ONDANSETRON HCL 4 MG/2ML IJ SOLN
INTRAMUSCULAR | Status: AC
  Filled 2022-09-30: qty 2

## 2022-09-30 MED ORDER — ALBUTEROL SULFATE (2.5 MG/3ML) 0.083% IN NEBU: 3.0000 mL | INHALATION_SOLUTION | Freq: Four times a day (QID) | RESPIRATORY_TRACT | Status: DC | PRN

## 2022-09-30 MED ORDER — PROPOFOL 10 MG/ML IV BOLUS
INTRAVENOUS | Status: DC | PRN
  Administered 2022-09-30: 200 mg via INTRAVENOUS
  Administered 2022-09-30: 20 mg via INTRAVENOUS
  Administered 2022-09-30: 10 mg via INTRAVENOUS

## 2022-09-30 MED ORDER — KCL IN DEXTROSE-NACL 20-5-0.45 MEQ/L-%-% IV SOLN
INTRAVENOUS | Status: DC
  Filled 2022-09-30 (×2): qty 1000

## 2022-09-30 MED ORDER — DOCUSATE SODIUM 100 MG PO CAPS: 100.0000 mg | ORAL_CAPSULE | Freq: Two times a day (BID) | ORAL | Status: DC

## 2022-09-30 MED ORDER — FENTANYL CITRATE (PF) 100 MCG/2ML IJ SOLN
INTRAMUSCULAR | Status: DC | PRN
  Administered 2022-09-30: 50 ug via INTRAVENOUS
  Administered 2022-09-30: 150 ug via INTRAVENOUS
  Administered 2022-09-30 (×2): 50 ug via INTRAVENOUS

## 2022-09-30 MED ORDER — SUGAMMADEX SODIUM 200 MG/2ML IV SOLN
INTRAVENOUS | Status: DC | PRN
  Administered 2022-09-30: 200 mg via INTRAVENOUS

## 2022-09-30 MED ORDER — FENTANYL CITRATE (PF) 250 MCG/5ML IJ SOLN
INTRAMUSCULAR | Status: AC
  Filled 2022-09-30: qty 5

## 2022-09-30 MED ORDER — LABETALOL HCL 5 MG/ML IV SOLN
10.0000 mg | INTRAVENOUS | Status: AC | PRN
  Administered 2022-09-30 (×2): 10 mg via INTRAVENOUS

## 2022-09-30 MED ORDER — MIDAZOLAM HCL 2 MG/2ML IJ SOLN: 1.0000 mg | Freq: Once | INTRAMUSCULAR | Status: AC

## 2022-09-30 MED ORDER — DOCUSATE SODIUM 100 MG PO CAPS
100.0000 mg | ORAL_CAPSULE | Freq: Two times a day (BID) | ORAL | Status: DC
  Administered 2022-09-30 – 2022-10-01 (×2): 100 mg via ORAL
  Filled 2022-09-30 (×2): qty 1

## 2022-09-30 MED ORDER — TRIPLE ANTIBIOTIC 3.5-400-5000 EX OINT: 1.0000 | TOPICAL_OINTMENT | Freq: Three times a day (TID) | CUTANEOUS | Status: DC | PRN

## 2022-09-30 MED ORDER — PHENYLEPHRINE HCL (PRESSORS) 10 MG/ML IV SOLN
INTRAVENOUS | Status: DC | PRN
  Administered 2022-09-30: 200 ug via INTRAVENOUS

## 2022-09-30 MED ORDER — MIDAZOLAM HCL 5 MG/5ML IJ SOLN
INTRAMUSCULAR | Status: DC | PRN
  Administered 2022-09-30 (×2): 1 mg via INTRAVENOUS

## 2022-09-30 MED ORDER — PROMETHAZINE HCL 25 MG/ML IJ SOLN
6.2500 mg | INTRAMUSCULAR | Status: DC | PRN
  Administered 2022-09-30: 12.5 mg via INTRAVENOUS

## 2022-09-30 MED ORDER — DIPHENHYDRAMINE HCL 50 MG/ML IJ SOLN: 12.5000 mg | Freq: Four times a day (QID) | INTRAMUSCULAR | Status: DC | PRN

## 2022-09-30 MED ORDER — HYDROMORPHONE HCL 1 MG/ML IJ SOLN
INTRAMUSCULAR | Status: DC | PRN
  Administered 2022-09-30 (×2): .5 mg via INTRAVENOUS

## 2022-09-30 MED ORDER — ACETAMINOPHEN 10 MG/ML IV SOLN: 1000.0000 mg | Freq: Once | INTRAVENOUS | Status: DC | PRN

## 2022-09-30 MED ORDER — DEXAMETHASONE SODIUM PHOSPHATE 10 MG/ML IJ SOLN
INTRAMUSCULAR | Status: AC
  Filled 2022-09-30: qty 1

## 2022-09-30 MED ORDER — LIDOCAINE HCL (PF) 2 % IJ SOLN
INTRAMUSCULAR | Status: AC
  Filled 2022-09-30: qty 5

## 2022-09-30 MED ORDER — ZOLPIDEM TARTRATE 5 MG PO TABS: 5.0000 mg | ORAL_TABLET | Freq: Every evening | ORAL | Status: DC | PRN

## 2022-09-30 MED ORDER — LACTATED RINGERS IV SOLN: INTRAVENOUS | Status: DC | PRN

## 2022-09-30 MED ORDER — LACTATED RINGERS IV SOLN
INTRAVENOUS | Status: DC | PRN
  Administered 2022-09-30: 1000 mL

## 2022-09-30 SURGICAL SUPPLY — 99 items
ADH SKN CLS APL DERMABOND .7 (GAUZE/BANDAGES/DRESSINGS) ×2
APL PRP STRL LF DISP 70% ISPRP (MISCELLANEOUS) ×2
APL SWBSTK 6 STRL LF DISP (MISCELLANEOUS) ×2
APPLICATOR COTTON TIP 6 STRL (MISCELLANEOUS) ×2 IMPLANT
APPLICATOR COTTON TIP 6IN STRL (MISCELLANEOUS) ×2
BAG COUNTER SPONGE SURGICOUNT (BAG) IMPLANT
BAG SPNG CNTER NS LX DISP (BAG)
BLADE EXTENDED COATED 6.5IN (ELECTRODE) ×2 IMPLANT
BLADE HEX COATED 2.75 (ELECTRODE) ×2 IMPLANT
CATH FOLEY 2WAY SLVR 18FR 30CC (CATHETERS) ×2 IMPLANT
CATH ROBINSON RED A/P 16FR (CATHETERS) ×2 IMPLANT
CATH ROBINSON RED A/P 8FR (CATHETERS) ×2 IMPLANT
CATH TIEMANN FOLEY 18FR 5CC (CATHETERS) ×2 IMPLANT
CATH URET 5FR 28IN OPEN ENDED (CATHETERS) IMPLANT
CHLORAPREP W/TINT 26 (MISCELLANEOUS) ×2 IMPLANT
CLIP LIGATING HEM O LOK PURPLE (MISCELLANEOUS) ×2 IMPLANT
COVER BACK TABLE 60X90IN (DRAPES) ×2 IMPLANT
COVER SURGICAL LIGHT HANDLE (MISCELLANEOUS) ×2 IMPLANT
COVER TIP SHEARS 8 DVNC (MISCELLANEOUS) ×2 IMPLANT
COVER TIP SHEARS 8MM DA VINCI (MISCELLANEOUS) ×2
CUTTER ECHEON FLEX ENDO 45 340 (ENDOMECHANICALS) ×2 IMPLANT
DERMABOND ADVANCED .7 DNX12 (GAUZE/BANDAGES/DRESSINGS) ×2 IMPLANT
DISSECTOR ROUND CHERRY 3/8 STR (MISCELLANEOUS) ×2 IMPLANT
DRAIN CHANNEL 10F 3/8 F FF (DRAIN) ×2 IMPLANT
DRAIN CHANNEL RND F F (WOUND CARE) IMPLANT
DRAPE ARM DVNC X/XI (DISPOSABLE) ×8 IMPLANT
DRAPE COLUMN DVNC XI (DISPOSABLE) ×2 IMPLANT
DRAPE DA VINCI XI ARM (DISPOSABLE) ×8
DRAPE DA VINCI XI COLUMN (DISPOSABLE) ×2
DRAPE LAPAROTOMY T 102X78X121 (DRAPES) ×2 IMPLANT
DRAPE SURG IRRIG POUCH 19X23 (DRAPES) ×2 IMPLANT
DRAPE WARM FLUID 44X44 (DRAPES) ×2 IMPLANT
DRSG TEGADERM 4X4.75 (GAUZE/BANDAGES/DRESSINGS) ×2 IMPLANT
ELECT PENCIL ROCKER SW 15FT (MISCELLANEOUS) ×2 IMPLANT
ELECT REM PT RETURN 15FT ADLT (MISCELLANEOUS) ×2 IMPLANT
EVACUATOR SILICONE 100CC (DRAIN) ×2 IMPLANT
GAUZE 4X4 16PLY ~~LOC~~+RFID DBL (SPONGE) ×2 IMPLANT
GAUZE SPONGE 4X4 12PLY STRL (GAUZE/BANDAGES/DRESSINGS) ×2 IMPLANT
GLOVE BIO SURGEON STRL SZ 6.5 (GLOVE) ×2 IMPLANT
GLOVE SURG LX STRL 7.5 STRW (GLOVE) ×4 IMPLANT
GOWN SRG XL LVL 4 BRTHBL STRL (GOWNS) ×2 IMPLANT
GOWN STRL NON-REIN XL LVL4 (GOWNS) ×2
GOWN STRL REUS W/ TWL XL LVL3 (GOWN DISPOSABLE) ×4 IMPLANT
GOWN STRL REUS W/TWL XL LVL3 (GOWN DISPOSABLE) ×4
GUIDEWIRE ANG ZIPWIRE 035X150 (WIRE) IMPLANT
GUIDEWIRE STR DUAL SENSOR (WIRE) IMPLANT
HOLDER FOLEY CATH W/STRAP (MISCELLANEOUS) ×2 IMPLANT
IRRIG SUCT STRYKERFLOW 2 WTIP (MISCELLANEOUS) ×2
IRRIGATION SUCT STRKRFLW 2 WTP (MISCELLANEOUS) ×2 IMPLANT
IV LACTATED RINGERS 1000ML (IV SOLUTION) ×2 IMPLANT
KIT BASIN OR (CUSTOM PROCEDURE TRAY) ×2 IMPLANT
KIT TURNOVER KIT A (KITS) IMPLANT
LOOP VESSEL MAXI BLUE (MISCELLANEOUS) ×2 IMPLANT
NDL SAFETY ECLIP 18X1.5 (MISCELLANEOUS) ×2 IMPLANT
NS IRRIG 1000ML POUR BTL (IV SOLUTION) ×4 IMPLANT
PACK GENERAL/GYN (CUSTOM PROCEDURE TRAY) ×2 IMPLANT
PACK ROBOT UROLOGY CUSTOM (CUSTOM PROCEDURE TRAY) ×2 IMPLANT
PLUG CATH AND CAP STER (CATHETERS) ×2 IMPLANT
RELOAD STAPLE 45 4.1 GRN THCK (STAPLE) ×2 IMPLANT
SEAL CANN UNIV 5-8 DVNC XI (MISCELLANEOUS) ×8 IMPLANT
SEAL XI 5MM-8MM UNIVERSAL (MISCELLANEOUS) ×8
SET TUBE SMOKE EVAC HIGH FLOW (TUBING) ×2 IMPLANT
SOLUTION ELECTROLUBE (MISCELLANEOUS) ×2 IMPLANT
SPIKE FLUID TRANSFER (MISCELLANEOUS) ×2 IMPLANT
SPONGE T-LAP 4X18 ~~LOC~~+RFID (SPONGE) ×4 IMPLANT
STAPLE RELOAD 45 GRN (STAPLE) ×2 IMPLANT
STAPLE RELOAD 45MM GREEN (STAPLE) ×2
STAPLER VISISTAT 35W (STAPLE) ×2 IMPLANT
SURGILUBE 2OZ TUBE FLIPTOP (MISCELLANEOUS) ×2 IMPLANT
SUT CHROMIC 2 0 SH (SUTURE) ×2 IMPLANT
SUT CHROMIC 3 0 SH 27 (SUTURE) ×2 IMPLANT
SUT ETHILON 3 0 PS 1 (SUTURE) ×2 IMPLANT
SUT MNCRL 3 0 RB1 (SUTURE) ×2 IMPLANT
SUT MNCRL 3 0 VIOLET RB1 (SUTURE) ×2 IMPLANT
SUT MNCRL AB 4-0 PS2 18 (SUTURE) ×4 IMPLANT
SUT MONOCRYL 3 0 RB1 (SUTURE) ×4
SUT PDS PLUS 0 (SUTURE) ×4
SUT PDS PLUS AB 0 CT-2 (SUTURE) ×4 IMPLANT
SUT SILK 0 (SUTURE)
SUT SILK 0 30XBRD TIE 6 (SUTURE) IMPLANT
SUT SILK 2 0 (SUTURE)
SUT SILK 2-0 30XBRD TIE 12 (SUTURE) IMPLANT
SUT V-LOC BARB 180 2/0GR6 GS22 (SUTURE) ×2
SUT VIC AB 0 CT1 27 (SUTURE) ×4
SUT VIC AB 0 CT1 27XBRD ANTBC (SUTURE) ×4 IMPLANT
SUT VIC AB 1 CT1 27 (SUTURE) ×2
SUT VIC AB 1 CT1 27XBRD ANTBC (SUTURE) ×2 IMPLANT
SUT VIC AB 2-0 SH 27 (SUTURE) ×2
SUT VIC AB 2-0 SH 27X BRD (SUTURE) ×2 IMPLANT
SUTURE V-LC BRB 180 2/0GR6GS22 (SUTURE) IMPLANT
SYR 10ML LL (SYRINGE) ×2 IMPLANT
SYR 27GX1/2 1ML LL SAFETY (SYRINGE) ×2 IMPLANT
TAPE UMBILICAL 1/8 X36 TWILL (MISCELLANEOUS) ×2 IMPLANT
TOWEL OR NON WOVEN STRL DISP B (DISPOSABLE) ×2 IMPLANT
TRAY FOLEY MTR SLVR 16FR STAT (SET/KITS/TRAYS/PACK) ×2 IMPLANT
TROCAR Z THREAD OPTICAL 12X100 (TROCAR) IMPLANT
TROCAR Z-THREAD FIOS 5X100MM (TROCAR) IMPLANT
TUBING INSUFFLATION 10FT LAP (TUBING) IMPLANT
WATER STERILE IRR 1000ML POUR (IV SOLUTION) ×2 IMPLANT

## 2022-09-30 NOTE — Transfer of Care (Signed)
Immediate Anesthesia Transfer of Care Note  Patient: Cameron [name]  Procedure(s) Performed: XI ROBOTIC ASSISTED LAPAROSCOPIC RADICAL PROSTATECTOMY LEVEL 3 XI ROBOTIC ASSISTED BILATERAL PELVIC LYMPHADENECTOMY (Bilateral) BLADDER DIVERTICULECTOMY CYSTOSCOPY FLEXIBLE  Patient Location: PACU  Anesthesia Type:General  Level of Consciousness: drowsy and patient cooperative  Airway & Oxygen Therapy: Patient Spontanous Breathing and Patient connected to face mask oxygen  Post-op Assessment: Report given to RN and Post -op Vital signs reviewed and stable  Post vital signs: Reviewed and stable  Last Vitals:  Vitals Value Taken Time  BP 173/91 09/30/22 1607  Temp    Pulse 76 09/30/22 1611  Resp 16 09/30/22 1611  SpO2 100 % 09/30/22 1611  Vitals shown include unvalidated device data.  Last Pain:  Vitals:   09/30/22 1002  TempSrc:   PainSc: 0-No pain         Complications:  Encounter Notable Events  Notable Event Outcome Phase Comment  Difficult to intubate - unexpected  Intraprocedure Filed from anesthesia note documentation.

## 2022-09-30 NOTE — Progress Notes (Signed)
Patient ID: SAAHIR PRUDE, male   DOB: [birth year], 72 y.o.   MRN: [id number]  Post-op note  Subjective: The patient is doing well.  No complaints.  Objective: Vital signs in last 24 hours: Temp:  [98 F (36.7 C)-98.3 F (36.8 C)] 98.3 F (36.8 C) (01/04 1608) Pulse Rate:  [62-73] 73 (01/04 1608) Resp:  [16] 16 (01/04 0900) BP: (137-173)/(87-91) 173/91 (01/04 1608) SpO2:  [99 %-100 %] 100 % (01/04 1608) Weight:  [93.9 kg] 93.9 kg (01/04 1002)  Intake/Output from previous day: No intake/output data recorded. Intake/Output this shift: Total I/O In: 1600 [I.V.:1500; IV Piggyback:100] Out: 100 [Blood:100]  Physical Exam:  General: Alert and oriented. Abdomen: Soft, Nondistended. Incisions: Clean and dry. GU: Urine clear.  Lab Results: No results for input(s): "HGB", "HCT" in the last 72 hours.  Assessment/Plan: POD#0   1) Continue to monitor, ambulate, IS   Pryor Curia. MD   LOS: 0 days   Dutch Gray 09/30/2022, 4:30 PM

## 2022-09-30 NOTE — Op Note (Signed)
Preoperative diagnosis: Clinically localized adenocarcinoma of the prostate (clinical stage T1c Nx Mx), bladder diverticulum  Postoperative diagnosis: Clinically localized adenocarcinoma of the prostate (clinical stage T1c Nx Mx), bladder diverticulum  Procedure:  Robotic assisted laparoscopic radical prostatectomy (bilateral nerve sparing) Bilateral robotic assisted laparoscopic pelvic lymphadenectomy Cystoscopy Right ureteral stent placement (and removal) Robotic assisted laparoscopic bladder diverticulectomy  Surgeon: Pryor Curia. M.D.  Assistant: Debbrah Alar, PA-C  An assistant was required for this surgical procedure.  The duties of the assistant included but were not limited to suctioning, passing suture, camera manipulation, retraction. This procedure would not be able to be performed without an Environmental consultant.  Anesthesia: General  Complications: None  EBL: 100 mL  IVF:  1200 mL crystalloid  Specimens: Prostate and seminal vesicles Right pelvic lymph nodes Left pelvic lymph nodes  Disposition of specimens: Pathology  Drains: 24 Fr coude catheter # 19 Blake pelvic drain  Indication: Cameron [name] is a 72 y.o. year old patient with clinically localized prostate cancer and a large bladder diverticulum.  After a thorough review of the management options for treatment of prostate cancer, he elected to proceed with surgical therapy and the above procedure(s).  We have discussed the potential benefits and risks of the procedure, side effects of the proposed treatment, the likelihood of the patient achieving the goals of the procedure, and any potential problems that might occur during the procedure or recuperation. Informed consent has been obtained.  Description of procedure:  The patient was taken to the operating room and a general anesthetic was administered. He was given preoperative antibiotics, placed in the dorsal lithotomy position, and prepped and draped in the  usual sterile fashion. Next a preoperative timeout was performed.   Flexible cystourethroscopy was performed which revealed a normal anterior urethra.  The prostatic urethra demonstrated a healing TURP defect consistent with his prior TURP a couple months ago.  Inspection of the bladder revealed the neck of a large bladder diverticulum on the right side of the bladder just beyond the ureteral orifice consistent with a Hutch diverticulum.  The ureteral orifice was identified and a 0.38 sensor guidewire was able to be advanced into the orifice and up the right ureter.  A 6 French ureteral catheter was then passed over the wire into the proximal right ureter to aid with intraoperative ureteral identification.  A urethral catheter was placed into the bladder and the stent was secured to the urethral catheter.  A site was selected near the umbilicus for placement of the camera port. This was placed using a standard open Hassan technique which allowed entry into the peritoneal cavity under direct vision and without difficulty. An 8 mm robotic port was placed and a pneumoperitoneum established. The camera was then used to inspect the abdomen and there was no evidence of any intra-abdominal injuries or other abnormalities. The remaining abdominal ports were then placed. 8 mm robotic ports were placed in the right lower quadrant, left lower quadrant, and far left lateral abdominal wall. A 5 mm port was placed in the right upper quadrant and a 12 mm port was placed in the right lateral abdominal wall for laparoscopic assistance. All ports were placed under direct vision without difficulty. The surgical cart was then docked.   Utilizing the cautery scissors, the bladder was reflected posteriorly allowing entry into the space of Retzius and identification of the endopelvic fascia and prostate.  The bladder was filled helping to outline the bladder and the large bladder diverticulum which  had been measured to hold  approximately 1 L on prior imaging studies.  Careful dissection was then performed to separate the diverticulum from the bladder medially and the diverticulum from the pelvic sidewall laterally.  Dissection proceeded with a combination of sharp and cautery dissection to free the bladder diverticulum.  As the dissection proceeded posteriorly, the right ureter was able to be identified due to the indwelling ureteral stent.  The ureter was carefully dissected away from the bladder diverticulum and the diverticulum was dissected down to the neck on the right side of the bladder.  The diverticulum was then divided at the neck and removed.  This opening was then closed with a 2 layer running 2-0 Vicryl V-Loc stitch.   Attention then turned to the prostatectomy.  The periprostatic fat was then removed from the prostate allowing full exposure of the endopelvic fascia. The endopelvic fascia was then incised from the apex back to the base of the prostate bilaterally and the underlying levator muscle fibers were swept laterally off the prostate thereby isolating the dorsal venous complex. The dorsal vein was then stapled and divided with a 45 mm Flex Echelon stapler. Attention then turned to the bladder neck which was divided anteriorly thereby allowing entry into the bladder and exposure of the urethral catheter. The catheter balloon was deflated and the catheter was brought into the operative field and used to retract the prostate anteriorly. The posterior bladder neck was then examined.  The indwelling right ureteral stent was able to be identified.  The bladder neck was divided allowing further dissection between the bladder and prostate posteriorly until the vasa deferentia and seminal vessels were identified. The vasa deferentia were isolated, divided, and lifted anteriorly.  At this time, the catheter was removed along with the ureteral stent.  The seminal vesicles were dissected down to their tips with care to  control the seminal vascular arterial blood supply. These structures were then lifted anteriorly and the space between Denonvillier's fascia and the anterior rectum was developed with a combination of sharp and blunt dissection. This isolated the vascular pedicles of the prostate.  The lateral prostatic fascia was then sharply incised allowing release of the neurovascular bundles bilaterally. The vascular pedicles of the prostate were then ligated with Weck clips between the prostate and neurovascular bundles and divided with sharp cold scissor dissection resulting in neurovascular bundle preservation. The neurovascular bundles were then separated off the apex of the prostate and urethra bilaterally.  The urethra was then sharply transected allowing the prostate specimen to be disarticulated. The pelvis was copiously irrigated and hemostasis was ensured. There was no evidence for rectal injury.  Attention then turned to the right pelvic sidewall. The fibrofatty tissue between the external iliac vein, confluence of the iliac vessels, hypogastric artery, and Cooper's ligament was dissected free from the pelvic sidewall with care to preserve the obturator nerve. Weck clips were used for lymphostasis and hemostasis. An identical procedure was performed on the contralateral side and the lymphatic packets were removed for permanent pathologic analysis.  Attention then turned to the urethral anastomosis. A 2-0 Vicryl slip knot was placed between Denonvillier's fascia, the posterior bladder neck, and the posterior urethra to reapproximate these structures. A double-armed 3-0 Monocryl suture was then used to perform a 360 running tension-free anastomosis between the bladder neck and urethra. A new urethral catheter was then placed into the bladder and irrigated. There were no blood clots within the bladder and the anastomosis appeared to be watertight. A #  [place] drain was then brought through the left lateral 8 mm  port site and positioned appropriately within the pelvis. It was secured to the skin with a nylon suture. The surgical cart was then undocked. The right lateral 12 mm port site was closed at the fascial level with a 0 Vicryl suture placed laparoscopically. All remaining ports were then removed under direct vision. The prostate specimen was removed intact within the Endopouch retrieval bag via the periumbilical camera port site. This fascial opening was closed with two running 0 PDS sutures. 0.25% Marcaine was then injected into all port sites and all incisions were reapproximated at the skin level with 4-0 Monocryl subcuticular sutures and Dermabond. The patient appeared to tolerate the procedure well and without complications. The patient was able to be extubated and transferred to the recovery unit in satisfactory condition.   Pryor Curia MD

## 2022-09-30 NOTE — Anesthesia Postprocedure Evaluation (Signed)
Anesthesia Post Note  Patient: Cameron [name]  Procedure(s) Performed: XI ROBOTIC ASSISTED LAPAROSCOPIC RADICAL PROSTATECTOMY LEVEL 3 XI ROBOTIC ASSISTED BILATERAL PELVIC LYMPHADENECTOMY (Bilateral) BLADDER DIVERTICULECTOMY CYSTOSCOPY FLEXIBLE     Patient location during evaluation: PACU Anesthesia Type: General Level of consciousness: awake and sedated Pain management: pain level controlled Vital Signs Assessment: post-procedure vital signs reviewed and stable Respiratory status: spontaneous breathing Cardiovascular status: stable Postop Assessment: no apparent nausea or vomiting Anesthetic complications: no  Encounter Notable Events  Notable Event Outcome Phase Comment  Difficult to intubate - unexpected  Intraprocedure Filed from anesthesia note documentation.    Last Vitals:  Vitals:   09/30/22 1715 09/30/22 1730  BP: (!) 168/90 (!) 160/118  Pulse: 76 91  Resp: 10 17  Temp:    SpO2: 100% 97%    Last Pain:  Vitals:   09/30/22 1700  TempSrc:   PainSc: [place] Jr

## 2022-09-30 NOTE — Discharge Instructions (Signed)

## 2022-09-30 NOTE — Anesthesia Procedure Notes (Signed)
Procedure Name: Intubation Date/Time: 09/30/2022 12:20 PM  Performed by: Garrel Ridgel, CRNAPre-anesthesia Checklist: Patient identified, Emergency Drugs available, Suction available and Patient being monitored Patient Re-evaluated:Patient Re-evaluated prior to induction Oxygen Delivery Method: Circle system utilized Preoxygenation: Pre-oxygenation with 100% oxygen Induction Type: IV induction Ventilation: Mask ventilation without difficulty Laryngoscope Size: Glidescope and 3 Grade View: Grade II Tube type: Oral Tube size: 7.5 mm Number of attempts: 2 Airway Equipment and Method: Stylet, Patient positioned with wedge pillow and Video-laryngoscopy Placement Confirmation: ETT inserted through vocal cords under direct vision, positive ETCO2 and breath sounds checked- equal and bilateral Secured at: 24 cm Tube secured with: Tape Dental Injury: Teeth and Oropharynx as per pre-operative assessment  Difficulty Due To: Difficulty was unanticipated and Difficult Airway- due to immobile epiglottis

## 2022-09-30 NOTE — Anesthesia Preprocedure Evaluation (Addendum)
Anesthesia Evaluation  Patient identified by MRN, date of birth, ID band Patient awake    Reviewed: Allergy & Precautions, H&P , NPO status , Patient's Chart, lab work & pertinent test results  Airway Mallampati: II  TM Distance: >3 FB Neck ROM: Full    Dental no notable dental hx. (+) Partial Lower, Partial Upper, Poor Dentition, Dental Advisory Given, Missing,    Pulmonary COPD,  COPD inhaler, Current Smoker and Patient abstained from smoking.   Pulmonary exam normal breath sounds clear to auscultation       Cardiovascular hypertension, Pt. on medications Normal cardiovascular exam     Neuro/Psych negative neurological ROS  negative psych ROS   GI/Hepatic negative GI ROS, Neg liver ROS,,,  Endo/Other  negative endocrine ROS    Renal/GU negative Renal ROS  negative genitourinary   Musculoskeletal negative musculoskeletal ROS (+)    Abdominal Normal abdominal exam  (+)   Peds  Hematology negative hematology ROS (+)   Anesthesia Other Findings   Reproductive/Obstetrics negative OB ROS                             Anesthesia Physical Anesthesia Plan  ASA: 3  Anesthesia Plan: General   Post-op Pain Management: Tylenol PO (pre-op)*   Induction: Intravenous  PONV Risk Score and Plan: 3 and Ondansetron, Dexamethasone and Midazolam  Airway Management Planned: Oral ETT  Additional Equipment: None  Intra-op Plan:   Post-operative Plan: Extubation in OR  Informed Consent: I have reviewed the patients History and Physical, chart, labs and discussed the procedure including the risks, benefits and alternatives for the proposed anesthesia with the patient or authorized representative who has indicated his/her understanding and acceptance.     Dental advisory given  Plan Discussed with: CRNA  Anesthesia Plan Comments:         Anesthesia Quick Evaluation

## 2022-09-30 NOTE — Interval H&P Note (Signed)
History and Physical Interval Note:  09/30/2022 11:08 AM  Cameron [name]  has presented today for surgery, with the diagnosis of PROSTATE CANCER.  The various methods of treatment have been discussed with the patient and family. After consideration of risks, benefits and other options for treatment, the patient has consented to  Procedure(s) with comments: XI ROBOTIC ASSISTED LAPAROSCOPIC RADICAL PROSTATECTOMY LEVEL 3 (N/A) - 270 MINUTES NEEDED FOR CASE XI ROBOTIC ASSISTED BILATERAL PELVIC LYMPHADENECTOMY (Bilateral) BLADDER DIVERTICULECTOMY (N/A) as a surgical intervention.  The patient's history has been reviewed, patient examined, no change in status, stable for surgery.  I have reviewed the patient's chart and labs.  Questions were answered to the patient's satisfaction.     [employer]

## 2022-10-01 ENCOUNTER — Other Ambulatory Visit: Payer: Self-pay

## 2022-10-01 ENCOUNTER — Encounter (HOSPITAL_COMMUNITY): Payer: Self-pay | Admitting: Urology

## 2022-10-01 DIAGNOSIS — C61 Malignant neoplasm of prostate: Secondary | ICD-10-CM | POA: Diagnosis not present

## 2022-10-01 LAB — HEMOGLOBIN AND HEMATOCRIT, BLOOD
HCT: 45.7 % (ref 39.0–52.0)
Hemoglobin: 15.2 g/dL (ref 13.0–17.0)

## 2022-10-01 LAB — CREATININE, FLUID (PLEURAL, PERITONEAL, JP DRAINAGE): Creat, Fluid: 1.2 mg/dL

## 2022-10-01 MED ORDER — BISACODYL 10 MG RE SUPP
10.0000 mg | Freq: Once | RECTAL | Status: AC
  Administered 2022-10-01: 10 mg via RECTAL
  Filled 2022-10-01: qty 1

## 2022-10-01 MED ORDER — TRAMADOL HCL 50 MG PO TABS: 50.0000 mg | ORAL_TABLET | Freq: Four times a day (QID) | ORAL | Status: DC | PRN

## 2022-10-01 NOTE — Progress Notes (Signed)
Patient ID: Cameron [name], male   DOB: Nov 01, 1950, 72 y.o.   MRN: [id number]  1 Day Post-Op Subjective: The patient is doing well.  No nausea or vomiting. Pain is adequately controlled.  Objective: Vital signs in last 24 hours: Temp:  [97.6 F (36.4 C)-98.6 F (37 C)] 97.6 F (36.4 C) (01/05 0444) Pulse Rate:  [62-91] 74 (01/05 0444) Resp:  [9-21] 18 (01/05 0444) BP: (123-185)/(63-118) 127/63 (01/05 0444) SpO2:  [91 %-100 %] 96 % (01/05 0444) Weight:  [93.3 kg-93.9 kg] 93.3 kg (01/05 0532)  Intake/Output from previous day: 01/04 0701 - 01/05 0700 In: 1750 [I.V.:1500; IV Piggyback:100] Out: 660 [Urine:500; Drains:60; Blood:100] Intake/Output this shift: No intake/output data recorded.  Physical Exam:  General: Alert and oriented. CV: RRR Lungs: Clear bilaterally. GI: Soft, Nondistended. Incisions: Clean, dry, and intact Urine: Clear Extremities: Nontender, no erythema, no edema.  Lab Results: Recent Labs    09/30/22 1620 10/01/22 0500  HGB 16.4 15.2  HCT 50.6 45.7      Assessment/Plan: POD# 1 s/p robotic prostatectomy.  1) SL IVF 2) Ambulate, Incentive spirometry 3) Transition to oral pain medication 4) Dulcolax suppository 5) D/C pelvic drain 6) Plan for likely discharge later today   Pryor Curia. MD   LOS: 0 days   Dutch Gray 10/01/2022, 7:35 AM

## 2022-10-01 NOTE — Discharge Summary (Signed)
  Date of admission: 09/30/2022  Date of discharge: 10/01/2022  Admission diagnosis: Prostate Cancer  Discharge diagnosis: Prostate Cancer  History and Physical: For full details, please see admission history and physical. Briefly, Cameron [name] is a 72 y.o. gentleman with localized prostate cancer.  After discussing management/treatment options, he elected to proceed with surgical treatment.  Hospital Course: Cameron [name] was taken to the operating room on 09/30/2022 and underwent a robotic assisted laparoscopic radical prostatectomy. He tolerated this procedure well and without complications. Postoperatively, he was able to be transferred to a regular hospital room following recovery from anesthesia.  He was able to begin ambulating the night of surgery. He remained hemodynamically stable overnight.  He had excellent urine output with appropriately minimal output from his pelvic drain and his pelvic drain was removed on POD #1.  He was transitioned to oral pain medication, tolerated a clear liquid diet, and had met all discharge criteria and was able to be discharged home later on POD#1.  Laboratory values:  Recent Labs    09/30/22 1620 10/01/22 0500  HGB 16.4 15.2  HCT 50.6 45.7    Disposition: Home  Discharge instruction: He was instructed to be ambulatory but to refrain from heavy lifting, strenuous activity, or driving. He was instructed on urethral catheter care.  Discharge medications:   Allergies as of 10/01/2022   No Known Allergies      Medication List     STOP taking these medications    vitamin C 1000 MG tablet       TAKE these medications    albuterol 108 (90 Base) MCG/ACT inhaler Commonly known as: VENTOLIN HFA Inhale 2 puffs into the lungs every 6 (six) hours as needed for wheezing or shortness of breath.   atorvastatin 20 MG tablet Commonly known as: LIPITOR Take 1 tablet (20 mg total) by mouth daily.   benazepril 40 MG tablet Commonly known as:  LOTENSIN Take 1 tablet (40 mg total) by mouth daily.   docusate sodium 100 MG capsule Commonly known as: COLACE Take 1 capsule (100 mg total) by mouth 2 (two) times daily.   hydrochlorothiazide 12.5 MG capsule Commonly known as: MICROZIDE Take 1 capsule (12.5 mg total) by mouth daily.   PreviDent 5000 Booster Plus 1.1 % Pste Generic drug: Sodium Fluoride Place 1 Application onto teeth in the morning and at bedtime.   sildenafil 50 MG tablet Commonly known as: Viagra Take 0.5-2 tablets (25-100 mg total) by mouth daily as needed for erectile dysfunction.   sulfamethoxazole-trimethoprim 800-160 MG tablet Commonly known as: BACTRIM DS Take 1 tablet by mouth 2 (two) times daily. Start the day prior to foley removal appointment   traMADol 50 MG tablet Commonly known as: Ultram Take 1-2 tablets (50-100 mg total) by mouth every 6 (six) hours as needed for moderate pain or severe pain.        Followup: He will followup in 1 week for catheter removal and to discuss his surgical pathology results.

## 2022-10-01 NOTE — Progress Notes (Signed)
  Transition of Care (TOC) Screening Note   Patient Details  Name: Cameron [name] Date of Birth: 1951/06/21   Transition of Care [facility] CM/SW Contact:    Dessa Phi, RN Phone Number: 10/01/2022, 11:13 AM    Transition of Care Department [facility]) has reviewed patient and no TOC needs have been identified at this time. We will continue to monitor patient advancement through interdisciplinary progression rounds. If new patient transition needs arise, please place a TOC consult.

## 2022-10-05 LAB — SURGICAL PATHOLOGY

## 2022-10-12 ENCOUNTER — Telehealth: Payer: Self-pay | Admitting: Nurse Practitioner

## 2022-10-12 NOTE — Telephone Encounter (Signed)
Pt's wife, Juliann Pulse, called stating the pt recently had his prostate & diverticular removed. Juliann Pulse states the pt is having issues with sleeping at night & fatigue. athy is requesting meds to help with fatigue & sleeping? Told Juliann Pulse that the pt might need a ov scheduled if the pt wants meds, Juliann Pulse states pt might not want to attend ov & requested a possible virtual visit? Pt's last ov with Matt was 06/23/22. Call back # [phone]

## 2022-10-12 NOTE — Telephone Encounter (Signed)
Please advise, thank you!

## 2022-10-13 NOTE — Telephone Encounter (Signed)
FYI, thanks.

## 2022-10-13 NOTE — Telephone Encounter (Signed)
Lets do an office visit. He may need lab work

## 2022-10-13 NOTE — Telephone Encounter (Signed)
Spoke to pt's wife, Juliann Pulse states pt doesn't want to come in & states pt took some pain meds given after surgery. Juliann Pulse stated the meds seemed to work & the pt will be fine for now. Call back # [phone]

## 2022-10-25 ENCOUNTER — Telehealth: Payer: Self-pay | Admitting: Nurse Practitioner

## 2022-10-25 NOTE — Telephone Encounter (Signed)
Do you do this?

## 2022-10-25 NOTE — Telephone Encounter (Signed)
Ameila from [facility] from [place] location is sending over a fax functional compacity evaluation form and PCP needs to complete and fax. Call back number [phone].

## 2022-10-25 NOTE — Telephone Encounter (Signed)
I would need to see the form. I thought PT did that type of eval?

## 2022-10-28 ENCOUNTER — Telehealth: Payer: Self-pay | Admitting: Nurse Practitioner

## 2022-10-28 NOTE — Telephone Encounter (Signed)
Patient notified

## 2022-10-28 NOTE — Telephone Encounter (Signed)
Yes that is fine to do it at the already scheduled office visit

## 2022-10-28 NOTE — Telephone Encounter (Signed)
Got a form for medical clearance in regards to the patient doing functional testing. Can we get him in office so I can talk with him about it please

## 2022-10-28 NOTE — Telephone Encounter (Signed)
Patient is scheduled for a 6 month on February 8 and he was wanting to know if this could be done then.

## 2022-11-04 ENCOUNTER — Ambulatory Visit (INDEPENDENT_AMBULATORY_CARE_PROVIDER_SITE_OTHER): Payer: HMO | Admitting: Nurse Practitioner

## 2022-11-04 ENCOUNTER — Encounter: Payer: Self-pay | Admitting: Nurse Practitioner

## 2022-11-04 VITALS — BP 110/64 | HR 72 | Temp 97.8°F | Resp 16 | Ht 70.0 in | Wt 202.0 lb

## 2022-11-04 DIAGNOSIS — J432 Centrilobular emphysema: Secondary | ICD-10-CM

## 2022-11-04 DIAGNOSIS — I10 Essential (primary) hypertension: Secondary | ICD-10-CM | POA: Diagnosis not present

## 2022-11-04 DIAGNOSIS — C61 Malignant neoplasm of prostate: Secondary | ICD-10-CM | POA: Diagnosis not present

## 2022-11-04 DIAGNOSIS — E663 Overweight: Secondary | ICD-10-CM | POA: Diagnosis not present

## 2022-11-04 NOTE — Assessment & Plan Note (Signed)
Incidental finding on exam.  No inhalers currently stable

## 2022-11-04 NOTE — Progress Notes (Signed)
Established Patient Office Visit  Subjective   Patient ID: Cameron [name], male    DOB: Dec 31, 1950  Age: 72 y.o. MRN: [id number]  Chief Complaint  Patient presents with   Medical Clearance       HTN: states that he has a cuff at home. Does not check it at home. tolerates the benazepril and hctz.  Prostate cancer: Dr Alinda Money that diverticulum of the bladder and found prostate cancer that had a turp prcedure.  Patient also underwent a radical prostatectomy  Emphysema: incidental finding on exam and not   Polycythemia: past few blood test his H&H has been under control   Medical clearnace: was seen by Dr. Amedeo Plenty and was cleared to go to work. States that the hand injury was workmans comp. They want him to have FCT prior to going back to work. The original procedure was 06/2022  States that he has to move up to 4000 pounds  and requires his hand for deterity in the bolts. States that he is having decreased sensation to the left 3rd and 4th digit.  States 12/28/2021 with work. States that is when the injry happened   Right hand dominant. Left hand is injuired   Review of Systems  Constitutional:  Negative for chills and fever.  Respiratory:  Negative for shortness of breath.   Cardiovascular:  Negative for chest pain.  Genitourinary:        "+" incontinence   Neurological:  Positive for tingling.      Objective:     BP 110/64   Pulse 72   Temp 97.8 F (36.6 C)   Resp 16   Ht '5\' 10"'$  (1.778 m)   Wt 202 lb (91.6 kg)   SpO2 98%   BMI 28.98 kg/m  BP Readings from Last 3 Encounters:  11/04/22 110/64  10/01/22 120/67  09/22/22 134/78   Wt Readings from Last 3 Encounters:  11/04/22 202 lb (91.6 kg)  10/01/22 205 lb 11 oz (93.3 kg)  09/22/22 207 lb (93.9 kg)      Physical Exam Vitals and nursing note reviewed.  Constitutional:      Appearance: Normal appearance.  Cardiovascular:     Rate and Rhythm: Normal rate and regular rhythm.     Heart sounds: Normal heart  sounds.  Pulmonary:     Effort: Pulmonary effort is normal.     Breath sounds: Normal breath sounds.  Musculoskeletal:     Comments: Decreased ROM in regards to closing left hand Partial amputation of the left 3rd digit, distally  Neurological:     Mental Status: He is alert.     Deep Tendon Reflexes:     Reflex Scores:      Bicep reflexes are 1+ on the right side and 1+ on the left side.    Comments: Bilateral upper strength 5/5      No results found for any visits on 11/04/22.    The 10-year ASCVD risk score (Arnett DK, et al., 2019) is: 17.5%    Assessment & Plan:   Problem List Items Addressed This Visit       Cardiovascular and Mediastinum   HTN (hypertension), benign - Primary    Blood pressure well-controlled on medications.  Continue medication as prescribed patient is medically cleared to participate in functional testing through physical therapy        Respiratory   Centrilobular emphysema ([place])    Incidental finding on exam.  No inhalers currently stable  Genitourinary   Prostate cancer [facility])    Patient is followed by Dr. Alinda Money urology and has underwent her radical prostatectomy.  Continue following with  urology as recommended        Other   Overweight (BMI 25.0-29.9)    Return in about 6 months (around 05/05/2023) for CPE and Labs.    Romilda Garret, NP

## 2022-11-04 NOTE — Patient Instructions (Signed)
Nice to see you today I want to see you in 6 months we will do you physical and labs at that point. Follow up sooner if you need me I am clearing you to do the functional testing. I will fill the form out and we will fax it to [facility]

## 2022-11-04 NOTE — Assessment & Plan Note (Addendum)
Patient is followed by Dr. Alinda Money urology and has underwent her radical prostatectomy.  Continue following with  urology as recommended

## 2022-11-04 NOTE — Assessment & Plan Note (Signed)
Blood pressure well-controlled on medications.  Continue medication as prescribed patient is medically cleared to participate in functional testing through physical therapy

## 2022-11-16 ENCOUNTER — Ambulatory Visit (INDEPENDENT_AMBULATORY_CARE_PROVIDER_SITE_OTHER): Payer: HMO

## 2022-11-16 DIAGNOSIS — Z0000 Encounter for general adult medical examination without abnormal findings: Secondary | ICD-10-CM | POA: Diagnosis not present

## 2022-11-16 NOTE — Progress Notes (Signed)
I connected with  Jeannette How Lindvall on 11/16/22 by a audio enabled telemedicine application and verified that I am speaking with the correct person using two identifiers.  Patient Location: Home  Provider Location: Office/Clinic  I discussed the limitations of evaluation and management by telemedicine. The patient expressed understanding and agreed to proceed.  Subjective:   Cameron [name] is a 72 y.o. male who presents for Medicare Annual/Subsequent preventive examination.  Review of Systems    Cardiac Risk Factors include: advanced age (>68mn, >>34women);dyslipidemia;hypertension;male gender;smoking/ tobacco exposure     Objective:    Today's Vitals   11/16/22 1003  Weight: 198 lb (89.8 kg)  Height: 5' 11"$  (1.803 m)   Body mass index is 27.62 kg/m.     11/16/2022   10:23 AM 11/16/2022   10:18 AM 10/01/2022    5:30 AM 09/22/2022    9:33 AM 07/08/2022    7:49 AM 07/02/2022   10:27 AM 05/15/2022   11:06 PM  Advanced Directives  Does Patient Have a Medical Advance Directive?  No No No No No No  Would patient like information on creating a medical advance directive? Yes (ED - Information included in AVS)  No - Patient declined No - Patient declined No - Patient declined      Current Medications (verified) Outpatient Encounter Medications as of 11/16/2022  Medication Sig   albuterol (VENTOLIN HFA) 108 (90 Base) MCG/ACT inhaler Inhale 2 puffs into the lungs every 6 (six) hours as needed for wheezing or shortness of breath.   atorvastatin (LIPITOR) 20 MG tablet Take 1 tablet (20 mg total) by mouth daily.   benazepril (LOTENSIN) 40 MG tablet Take 1 tablet (40 mg total) by mouth daily.   hydrochlorothiazide (MICROZIDE) 12.5 MG capsule Take 1 capsule (12.5 mg total) by mouth daily.   docusate sodium (COLACE) 100 MG capsule Take 1 capsule (100 mg total) by mouth 2 (two) times daily. (Patient not taking: Reported on 11/16/2022)   PREVIDENT 5000 BOOSTER PLUS 1.1 % PSTE Place 1 Application onto  teeth in the morning and at bedtime. (Patient not taking: Reported on 11/16/2022)   sildenafil (VIAGRA) 50 MG tablet Take 0.5-2 tablets (25-100 mg total) by mouth daily as needed for erectile dysfunction. (Patient not taking: Reported on 11/16/2022)   sulfamethoxazole-trimethoprim (BACTRIM DS) 800-160 MG tablet Take 1 tablet by mouth 2 (two) times daily. Start the day prior to foley removal appointment (Patient not taking: Reported on 11/16/2022)   traMADol (ULTRAM) 50 MG tablet Take 1-2 tablets (50-100 mg total) by mouth every 6 (six) hours as needed for moderate pain or severe pain. (Patient not taking: Reported on 11/16/2022)   No facility-administered encounter medications on file as of 11/16/2022.    Allergies (verified) Patient has no known allergies.   History: Past Medical History:  Diagnosis Date   Adenomatous colon polyp 05/2009; 11/2014   2010:Tubular adenoma, no high grade dysplasia..Marland Kitchen + Hyperplastic polyps. 2016: Tubular adenoma-rpt 5 yrs.   [place]    TURP for prostate cancer   Coronary artery disease    H/O chronic gastritis 05/2009   EGD: no h.pylori,dysplasia,or evidence of malignancy   Hyperlipidemia    lovaza from prior PMD-stopped due to cost   Hypertension    Obesity, Class I, BMI 30.0-34.9 (see actual BMI) 04/23/2014   Tobacco dependence    Past Surgical History:  Procedure Laterality Date   APPENDECTOMY  09/28/1971   BLADDER DIVERTICULECTOMY N/A 09/30/2022   Procedure: BLADDER DIVERTICULECTOMY;  Surgeon:  Raynelle Bring, MD;  Location: [facility];  Service: Urology;  Laterality: N/A;   CARPAL TUNNEL RELEASE Left    CHOLECYSTECTOMY N/A 07/06/2018   Procedure: LAPAROSCOPIC CHOLECYSTECTOMY WITH INTRAOPERATIVE CHOLANGIOGRAM;  Surgeon: Alphonsa Overall, MD;  Location: [facility];  Service: General;  Laterality: N/A;   COLONOSCOPY  09/27/2008   [facility]   CYSTOSCOPY N/A 09/30/2022   Procedure: Erlene Quan;  Surgeon: Raynelle Bring, MD;  Location: [facility];  Service: Urology;   Laterality: N/A;   EYE SURGERY  2018   Cataracts removed only   FINGER SURGERY Left 12/2021   Left middle finger, laceration   ROBOT ASSISTED LAPAROSCOPIC RADICAL PROSTATECTOMY N/A 09/30/2022   Procedure: XI ROBOTIC ASSISTED LAPAROSCOPIC RADICAL PROSTATECTOMY LEVEL 3;  Surgeon: Raynelle Bring, MD;  Location: [facility];  Service: Urology;  Laterality: N/A;  [place] CASE   STRABISMUS SURGERY  age 3 or 9   TRANSURETHRAL RESECTION OF PROSTATE N/A 07/08/2022   Procedure: TRANSURETHRAL RESECTION OF THE PROSTATE (TURP)/ CYSTOSCOPY;  Surgeon: Raynelle Bring, MD;  Location: [facility];  Service: Urology;  Laterality: N/A;   Family History  Problem Relation Age of Onset   Diabetes Mother    Heart disease Mother    COPD Father    Hypertension Father    Hyperlipidemia Father    Diabetes Father    Heart attack Father    Dementia Father    Alcohol abuse Maternal Grandfather    Social History   Socioeconomic History   Marital status: Widowed    Spouse name: Not on file   Number of children: 2   Years of education: Not on file   Highest education level: Not on file  Occupational History   Occupation: Retired    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   Works with entertainment companies to set up venues    Orig from [place], has lived in [place] since 1970.   Tobacco 80 pack-yr hx, ongoing as of 03/2014.   Rare alcohol.  No drug use except DISTANT use of marijuana.   Social Determinants of Health   Financial Resource Strain: Low Risk  (11/16/2022)   Overall  Financial Resource Strain (CARDIA)    Difficulty of Paying Living Expenses: Not hard at all  Food Insecurity: No Food Insecurity (11/16/2022)   Hunger Vital Sign    Worried About Running Out of Food in the Last Year: Never true    Ran Out of Food in the Last Year: Never true  Transportation Needs: No Transportation Needs (11/16/2022)   PRAPARE - Hydrologist (Medical): No    Lack of Transportation (Non-Medical): No  Physical Activity: Insufficiently Active (11/16/2022)   Exercise Vital Sign    Days of Exercise per Week: 3 days    Minutes of Exercise per Session: 30 min  Stress: No Stress Concern Present (11/16/2022)   [place]  Feeling of Stress : Not at all  Social Connections: Moderately Integrated (11/16/2022)   Social Connection and Isolation Panel [NHANES]    Frequency of Communication with Friends and Family: More than three times a week    Frequency of Social Gatherings with Friends and Family: Once a week    Attends Religious Services: More than 4 times per year    Active Member of [employer] or Organizations: No    Attends Music therapist: Never    Marital Status: Married    Tobacco Counseling Ready to quit: Not Answered Counseling given: Not Answered   Clinical Intake:  Pre-visit preparation completed: Yes  Pain : No/denies pain     BMI - recorded: 27.62 Nutritional Status: BMI 25 -29 Overweight Nutritional Risks: None Diabetes: No  How often do you need to have someone help you when you read instructions, pamphlets, or other written materials from your doctor or pharmacy?: 1 - Never  Diabetic?no  Interpreter Needed?: No  Information entered by :: B.Arik Husmann,LPN   Activities of Daily Living    11/16/2022   10:18 AM 09/30/2022   10:00 PM  In your present state of health, do you have any difficulty performing the following activities:  Hearing? 0 0   Vision? 0 0  Difficulty concentrating or making decisions? 0 0  Walking or climbing stairs? 0 0  Dressing or bathing? 0 0  Doing errands, shopping? 0 0  Preparing Food and eating ? N   Using the Toilet? N   In the past six months, have you accidently leaked urine? N   Do you have problems with loss of bowel control? N   Managing your Medications? N   Managing your Finances? N   Housekeeping or managing your Housekeeping? N     Patient Care Team: Michela Pitcher, NP as PCP - General (Nurse Practitioner) Druscilla Brownie, MD as Consulting Physician (Dermatology) Wonda Horner, MD as Consulting Physician (Gastroenterology)  Indicate any recent Medical Services you may have received from other than [facility] providers in the past year (date may be approximate).     Assessment:   This is a routine wellness examination for [employer].  Hearing/Vision screen Hearing Screening - Comments:: Adequate hearing Vision Screening - Comments:: Adequate vision Cataract surgery 3-4 years ago [place]   Dietary issues and exercise activities discussed: Current Exercise Habits: Home exercise routine, Type of exercise: walking, Time (Minutes): 30, Frequency (Times/Week): 4, Weekly Exercise (Minutes/Week): 120, Intensity: Mild, Exercise limited by: None identified (pt s/p prostate surgery Jan 2024;just walking)   Goals Addressed   None    Depression Screen    11/16/2022   10:10 AM 11/04/2022   10:05 AM 05/04/2022   10:19 AM 04/28/2022   11:18 AM 04/15/2022   10:42 AM 02/25/2022   10:14 AM 04/15/2021    1:14 PM  PHQ 2/9 Scores  PHQ - 2 Score 0 1 3 0 0 0 0  PHQ- 9 Score 0 7 8 0 0 0     Fall Risk    11/16/2022   10:07 AM 11/04/2022   10:12 AM 04/28/2022   11:18 AM 04/28/2022   11:10 AM 04/15/2022   10:42 AM  Fall Risk   Falls in the past year? 0 0 0 0 0  Number falls in past yr: 0 0 0 0 0  Injury with Fall? 0 0 0 0 0  Risk for fall due to : No Fall Risks No Fall Risks No Fall Risks No  Fall Risks No Fall  Risks  Follow up Education provided;Falls prevention discussed Falls evaluation completed Falls evaluation completed Falls evaluation completed Falls evaluation completed    FALL RISK PREVENTION PERTAINING TO THE HOME:  Any stairs in or around the home? Yes  If so, are there any without handrails? Yes  Home free of loose throw rugs in walkways, pet beds, electrical cords, etc? Yes  Adequate lighting in your home to reduce risk of falls? Yes   ASSISTIVE DEVICES UTILIZED TO PREVENT FALLS:  Life alert? No  Use of a cane, walker or w/c? No  Grab bars in the bathroom? YES Shower chair or bench in shower? No  Elevated toilet seat or a handicapped toilet? yes  Cognitive Function:    04/03/2018   10:59 AM  MMSE - Mini Mental State Exam  Orientation to time 5  Orientation to Place 5  Registration 3  Attention/ Calculation 5  Recall 2  Language- name 2 objects 2  Language- repeat 1  Language- follow 3 step command 3  Language- read & follow direction 1  Write a sentence 1  Copy design 1  Total score 29        11/16/2022   10:20 AM 02/06/2020   12:26 PM  6CIT Screen  What Year? 0 points 0 points  What month? 0 points 0 points  What time? 0 points 0 points  Count back from 20 0 points 0 points  Months in reverse 0 points 0 points  Repeat phrase 0 points 0 points  Total Score 0 points 0 points    Immunizations Immunization History  Administered Date(s) Administered   Fluad Quad(high Dose 65+) 06/22/2019, 07/17/2020, 07/10/2021, 08/24/2022   Influenza,inj,Quad PF,6+ Mos 05/22/2018   PFIZER(Purple Top)SARS-COV-2 Vaccination 11/18/2019, 12/12/2019   Pneumococcal Conjugate-13 07/17/2020   Pneumococcal Polysaccharide-23 04/20/2018   Tdap 02/11/2012, 05/05/2020    TDAP status: Up to date  Flu Vaccine status: Up to date  Pneumococcal vaccine status: Up to date  Covid-19 vaccine status: Completed vaccines  Qualifies for Shingles Vaccine? Yes   Zostavax completed No    Shingrix Completed?: No.    Education has been provided regarding the importance of this vaccine. Patient has been advised to call insurance company to determine out of pocket expense if they have not yet received this vaccine. Advised may also receive vaccine at local pharmacy or Health Dept. Verbalized acceptance and understanding.  Screening Tests Health Maintenance  Topic Date Due   Zoster Vaccines- Shingrix (1 of 2) Never done   COVID-19 Vaccine (3 - Pfizer risk series) 01/09/2020   Lung Cancer Screening  05/29/2020   Medicare Annual Wellness (AWV)  02/05/2021   COLONOSCOPY (Pts 45-66yr Insurance coverage will need to be confirmed)  04/16/2023 (Originally 12/06/2019)   DTaP/Tdap/Td (3 - Td or Tdap) 05/05/2030   Pneumonia Vaccine 70 Years old  Completed   INFLUENZA VACCINE  Completed   Hepatitis C Screening  Completed   HPV VACCINES  Aged Out    Health Maintenance  Health Maintenance Due  Topic Date Due   Zoster Vaccines- Shingrix (1 of 2) Never done   COVID-19 Vaccine (3 - Pfizer risk series) 01/09/2020   Lung Cancer Screening  05/29/2020   Medicare Annual Wellness (AWV)  02/05/2021    Colorectal cancer screening: Type of screening: Colonoscopy. Completed yes. Repeat every 5 years  Lung Cancer Screening: (Low Dose CT Chest recommended if Age 70-80years, 30 pack-year currently smoking OR have quit w/in 15years.) does not  qualify.   Lung Cancer Screening Referral: no  Additional Screening:  Hepatitis C Screening: does not qualify; Completed no  Vision Screening: Recommended annual ophthalmology exams for early detection of glaucoma and other disorders of the eye. Is the patient up to date with their annual eye exam?  No  Who is the provider or what is the name of the office in which the patient attends annual eye exams? [facility].does not see the need to go as vision is good If pt is not established with a provider, would they like to be referred to a provider to  establish care? No .   Dental Screening: Recommended annual dental exams for proper oral hygiene  Community Resource Referral / Chronic Care Management: CRR required this visit?  No   CCM required this visit?  No     Plan:     I have personally reviewed and noted the following in the patient's chart:   Medical and social history Use of alcohol, tobacco or illicit drugs  Current medications and supplements including opioid prescriptions. Patient is not currently taking opioid prescriptions. Functional ability and status Nutritional status Physical activity Advanced directives List of other physicians Hospitalizations, surgeries, and ER visits in previous 12 months Vitals Screenings to include cognitive, depression, and falls Referrals and appointments  In addition, I have reviewed and discussed with patient certain preventive protocols, quality metrics, and best practice recommendations. A written personalized care plan for preventive services as well as general preventive health recommendations were provided to patient.     Roger Shelter, LPN   [id number]   Nurse Notes: pt states he is doing good after recent prostate surgery last month. He states he has driven only four times recently (as told not to drive much initially). Pt voices no questions or concerns during this visit.

## 2022-11-16 NOTE — Patient Instructions (Signed)
[name] , Thank you for taking time to come for your Medicare Wellness Visit. I appreciate your ongoing commitment to your health goals. Please review the following plan we discussed and let me know if I can assist you in the future.   These are the goals we discussed:  Goals   None     This is a list of the screening recommended for you and due dates:  Health Maintenance  Topic Date Due   Zoster (Shingles) Vaccine (1 of 2) Never done   COVID-19 Vaccine (3 - Pfizer risk series) 01/09/2020   Screening for Lung Cancer  05/29/2020   Medicare Annual Wellness Visit  02/05/2021   Colon Cancer Screening  04/16/2023*   DTaP/Tdap/Td vaccine (3 - Td or Tdap) 05/05/2030   Pneumonia Vaccine  Completed   Flu Shot  Completed   Hepatitis C Screening: USPSTF Recommendation to screen - Ages 18-79 yo.  Completed   HPV Vaccine  Aged Out  *Topic was postponed. The date shown is not the original due date.    Advanced directives: no mailed information and paperwork as pt states he wants to put in place  Conditions/risks identified: none  Next appointment: Follow up in one year for your annual wellness visit. 11/21/2023 @11$ :30am telephone  Preventive Care 65 Years and Older, Male  Preventive care refers to lifestyle choices and visits with your health care provider that can promote health and wellness. What does preventive care include? A yearly physical exam. This is also called an annual well check. Dental exams once or twice a year. Routine eye exams. Ask your health care provider how often you should have your eyes checked. Personal lifestyle choices, including: Daily care of your teeth and gums. Regular physical activity. Eating a healthy diet. Avoiding tobacco and drug use. Limiting alcohol use. Practicing safe sex. Taking low doses of aspirin every day. Taking vitamin and mineral supplements as recommended by your health care provider. What happens during an annual well check? The  services and screenings done by your health care provider during your annual well check will depend on your age, overall health, lifestyle risk factors, and family history of disease. Counseling  Your health care provider may ask you questions about your: Alcohol use. Tobacco use. Drug use. Emotional well-being. Home and relationship well-being. Sexual activity. Eating habits. History of falls. Memory and ability to understand (cognition). Work and work Statistician. Screening  You may have the following tests or measurements: Height, weight, and BMI. Blood pressure. Lipid and cholesterol levels. These may be checked every 5 years, or more frequently if you are over 62 years old. Skin check. Lung cancer screening. You may have this screening every year starting at age 65 if you have a 30-pack-year history of smoking and currently smoke or have quit within the past 15 years. Fecal occult blood test (FOBT) of the stool. You may have this test every year starting at age 28. Flexible sigmoidoscopy or colonoscopy. You may have a sigmoidoscopy every 5 years or a colonoscopy every 10 years starting at age 41. Prostate cancer screening. Recommendations will vary depending on your family history and other risks. Hepatitis C blood test. Hepatitis B blood test. Sexually transmitted disease (STD) testing. Diabetes screening. This is done by checking your blood sugar (glucose) after you have not eaten for a while (fasting). You may have this done every 1-3 years. Abdominal aortic aneurysm (AAA) screening. You may need this if you are a current or former smoker. Osteoporosis.  You may be screened starting at age 69 if you are at high risk. Talk with your health care provider about your test results, treatment options, and if necessary, the need for more tests. Vaccines  Your health care provider may recommend certain vaccines, such as: Influenza vaccine. This is recommended every year. Tetanus,  diphtheria, and acellular pertussis (Tdap, Td) vaccine. You may need a Td booster every 10 years. Zoster vaccine. You may need this after age 89. Pneumococcal 13-valent conjugate (PCV13) vaccine. One dose is recommended after age 62. Pneumococcal polysaccharide (PPSV23) vaccine. One dose is recommended after age 65. Talk to your health care provider about which screenings and vaccines you need and how often you need them. This information is not intended to replace advice given to you by your health care provider. Make sure you discuss any questions you have with your health care provider. Document Released: 10/10/2015 Document Revised: 06/02/2016 Document Reviewed: 07/15/2015 Elsevier Interactive Patient Education  2017 [employer] Prevention in the Home Falls can cause injuries. They can happen to people of all ages. There are many things you can do to make your home safe and to help prevent falls. What can I do on the outside of my home? Regularly fix the edges of walkways and driveways and fix any cracks. Remove anything that might make you trip as you walk through a door, such as a raised step or threshold. Trim any bushes or trees on the path to your home. Use bright outdoor lighting. Clear any walking paths of anything that might make someone trip, such as rocks or tools. Regularly check to see if handrails are loose or broken. Make sure that both sides of any steps have handrails. Any raised decks and porches should have guardrails on the edges. Have any leaves, snow, or ice cleared regularly. Use sand or salt on walking paths during winter. Clean up any spills in your garage right away. This includes oil or grease spills. What can I do in the bathroom? Use night lights. Install grab bars by the toilet and in the tub and shower. Do not use towel bars as grab bars. Use non-skid mats or decals in the tub or shower. If you need to sit down in the shower, use a plastic,  non-slip stool. Keep the floor dry. Clean up any water that spills on the floor as soon as it happens. Remove soap buildup in the tub or shower regularly. Attach bath mats securely with double-sided non-slip rug tape. Do not have throw rugs and other things on the floor that can make you trip. What can I do in the bedroom? Use night lights. Make sure that you have a light by your bed that is easy to reach. Do not use any sheets or blankets that are too big for your bed. They should not hang down onto the floor. Have a firm chair that has side arms. You can use this for support while you get dressed. Do not have throw rugs and other things on the floor that can make you trip. What can I do in the kitchen? Clean up any spills right away. Avoid walking on wet floors. Keep items that you use a lot in easy-to-reach places. If you need to reach something above you, use a strong step stool that has a grab bar. Keep electrical cords out of the way. Do not use floor polish or wax that makes floors slippery. If you must use wax, use non-skid floor wax.  Do not have throw rugs and other things on the floor that can make you trip. What can I do with my stairs? Do not leave any items on the stairs. Make sure that there are handrails on both sides of the stairs and use them. Fix handrails that are broken or loose. Make sure that handrails are as long as the stairways. Check any carpeting to make sure that it is firmly attached to the stairs. Fix any carpet that is loose or worn. Avoid having throw rugs at the top or bottom of the stairs. If you do have throw rugs, attach them to the floor with carpet tape. Make sure that you have a light switch at the top of the stairs and the bottom of the stairs. If you do not have them, ask someone to add them for you. What else can I do to help prevent falls? Wear shoes that: Do not have high heels. Have rubber bottoms. Are comfortable and fit you well. Are closed  at the toe. Do not wear sandals. If you use a stepladder: Make sure that it is fully opened. Do not climb a closed stepladder. Make sure that both sides of the stepladder are locked into place. Ask someone to hold it for you, if possible. Clearly Om and make sure that you can see: Any grab bars or handrails. First and last steps. Where the edge of each step is. Use tools that help you move around (mobility aids) if they are needed. These include: Canes. Walkers. Scooters. Crutches. Turn on the lights when you go into a dark area. Replace any light bulbs as soon as they burn out. Set up your furniture so you have a clear path. Avoid moving your furniture around. If any of your floors are uneven, fix them. If there are any pets around you, be aware of where they are. Review your medicines with your doctor. Some medicines can make you feel dizzy. This can increase your chance of falling. Ask your doctor what other things that you can do to help prevent falls. This information is not intended to replace advice given to you by your health care provider. Make sure you discuss any questions you have with your health care provider. Document Released: 07/10/2009 Document Revised: 02/19/2016 Document Reviewed: 10/18/2014 Elsevier Interactive Patient Education  2017 [employer].

## 2022-11-25 ENCOUNTER — Ambulatory Visit (INDEPENDENT_AMBULATORY_CARE_PROVIDER_SITE_OTHER): Payer: HMO | Admitting: Nurse Practitioner

## 2022-11-25 ENCOUNTER — Encounter: Payer: Self-pay | Admitting: Nurse Practitioner

## 2022-11-25 VITALS — BP 132/64 | HR 72 | Temp 97.9°F | Resp 16 | Ht 71.0 in | Wt 206.2 lb

## 2022-11-25 DIAGNOSIS — W19XXXA Unspecified fall, initial encounter: Secondary | ICD-10-CM

## 2022-11-25 DIAGNOSIS — Z862 Personal history of diseases of the blood and blood-forming organs and certain disorders involving the immune mechanism: Secondary | ICD-10-CM

## 2022-11-25 DIAGNOSIS — R42 Dizziness and giddiness: Secondary | ICD-10-CM

## 2022-11-25 DIAGNOSIS — G4700 Insomnia, unspecified: Secondary | ICD-10-CM

## 2022-11-25 LAB — CBC
HCT: 44 % (ref 39.0–52.0)
Hemoglobin: 14.8 g/dL (ref 13.0–17.0)
MCHC: 33.6 g/dL (ref 30.0–36.0)
MCV: 94.1 fl (ref 78.0–100.0)
Platelets: 235 10*3/uL (ref 150.0–400.0)
RBC: 4.68 Mil/uL (ref 4.22–5.81)
RDW: 14.3 % (ref 11.5–15.5)
WBC: 6.6 10*3/uL (ref 4.0–10.5)

## 2022-11-25 LAB — COMPREHENSIVE METABOLIC PANEL
ALT: 11 U/L (ref 0–53)
AST: 14 U/L (ref 0–37)
Albumin: 3.7 g/dL (ref 3.5–5.2)
Alkaline Phosphatase: 81 U/L (ref 39–117)
BUN: 11 mg/dL (ref 6–23)
CO2: 31 mEq/L (ref 19–32)
Calcium: 9.3 mg/dL (ref 8.4–10.5)
Chloride: 100 mEq/L (ref 96–112)
Creatinine, Ser: 0.8 mg/dL (ref 0.40–1.50)
GFR: 90.17 mL/min (ref >60.00)
Glucose, Bld: 131 mg/dL — ABNORMAL HIGH (ref 70–99)
Potassium: 3.8 mEq/L (ref 3.5–5.1)
Sodium: 139 mEq/L (ref 135–145)
Total Bilirubin: 0.4 mg/dL (ref 0.2–1.2)
Total Protein: 6.2 g/dL (ref 6.0–8.3)

## 2022-11-25 MED ORDER — TRAZODONE HCL 50 MG PO TABS: 25.0000 mg | ORAL_TABLET | Freq: Every evening | ORAL | 0 refills | Status: DC | PRN

## 2022-11-25 NOTE — Progress Notes (Signed)
Acute Office Visit  Subjective:     Patient ID: Cameron [name], male    DOB: 07-06-1951, 72 y.o.   MRN: [id number]  Chief Complaint  Patient presents with   Medical Management of Chronic Issues    Felt off balance bp was good when they took it. EMT came out and everything looked good.    HPI Patient is in today for balance issue with a history of  HTN, CAD, emphysema, prostate cancer, polycythemia   Sunday morning when he got out of the bed. States that the equilbrim was off. States that he was bumping into things. States that it lasted an hour. States that after that he felt like he had to throw up but only heaved. States they did check the bp twice and was good.  EMT swere called an d checked orthostatic BP and CBG that was good per patient. States that he got checked out and decided not to go to the hospital and no other recurrence   Fall: was on  a chair to help clean cabinets. States that he went to get down to move the chair. States that he got back up on the chair the chair got off balance. States that he hit his back side on the kitchen floors that are vinyl flooring. States he did knock his head  Insomnia: state that he will go to sleep but he will wake up. Sometimes he can go back to sleep but sometimes he will not. He is not waking for the bathroom. States that he has had an issue since having surgery. Review of Systems  Constitutional:  Negative for chills and fever.  Respiratory:  Negative for shortness of breath.   Cardiovascular:  Negative for chest pain.  Neurological:  Positive for dizziness (disequlibrium). Negative for headaches.  Psychiatric/Behavioral:  Negative for hallucinations and suicidal ideas.         Objective:    BP 132/64   Pulse 72   Temp 97.9 F (36.6 C)   Resp 16   Ht '5\' 11"'$  (1.803 m)   Wt 206 lb 4 oz (93.6 kg)   SpO2 99%   BMI 28.77 kg/m    Physical Exam Vitals and nursing note reviewed.  Constitutional:      Appearance: Normal  appearance.  HENT:     Right Ear: Tympanic membrane, ear canal and external ear normal.     Left Ear: Tympanic membrane, ear canal and external ear normal.     Mouth/Throat:     Mouth: Mucous membranes are moist.     Pharynx: Oropharynx is clear.  Eyes:     Extraocular Movements: Extraocular movements intact.     Pupils: Pupils are equal, round, and reactive to light.  Cardiovascular:     Rate and Rhythm: Normal rate and regular rhythm.     Heart sounds: Normal heart sounds.  Pulmonary:     Effort: Pulmonary effort is normal.     Breath sounds: Normal breath sounds.  Musculoskeletal:     Right lower leg: No edema.     Left lower leg: No edema.  Neurological:     General: No focal deficit present.     Mental Status: He is alert.     Cranial Nerves: Cranial nerves 2-12 are intact.     Sensory: Sensation is intact.     Motor: Motor function is intact.     Coordination: Coordination is intact.     Gait: Gait is intact.  Deep Tendon Reflexes:     Reflex Scores:      Bicep reflexes are 2+ on the right side and 2+ on the left side.      Patellar reflexes are 2+ on the right side and 2+ on the left side.    No results found for any visits on 11/25/22.      Assessment & Plan:   Problem List Items Addressed This Visit       Other   Loss of equilibrium - Primary    Happened over the weekend lasted approximately 1 hour.  No other symptoms concerning for neurological infarct.  Patient's neurological exam totally benign in office.  Did review signs and symptoms when to be seen emergently.  Will check electrolytes and CBC as patient does have a history of polycythemia secondary likely to tobacco abuse      Relevant Orders   CBC   Comprehensive metabolic panel   Insomnia    Having difficulty with sleep since post procedure with his bladder diverticulum.  Patient has tried Doxy olamine over-the-counter but family would like patient to come off of that due to his age and  comorbidities.  Will try trazodone 25 to 50 mg nightly as needed.  He can update me via MyChart if this is effective or ineffective      Relevant Medications   traZODone (DESYREL) 50 MG tablet   Fall    Mechanical fall in nature.  Patient did not have loss of consciousness.  Neurological exam completely benign in office      History of polycythemia    Check CBC today.  Pending result      Relevant Orders   CBC    Meds ordered this encounter  Medications   traZODone (DESYREL) 50 MG tablet    Sig: Take 0.5-1 tablets (25-50 mg total) by mouth at bedtime as needed for sleep.    Dispense:  30 tablet    Refill:  0    Order Specific Question:   Supervising Provider    Answer:   Loura Pardon A [[id number]]    Return for As scheduled .  Romilda Garret, NP

## 2022-11-25 NOTE — Assessment & Plan Note (Signed)
Check CBC today.  Pending result

## 2022-11-25 NOTE — Assessment & Plan Note (Signed)
Mechanical fall in nature.  Patient did not have loss of consciousness.  Neurological exam completely benign in office

## 2022-11-25 NOTE — Assessment & Plan Note (Signed)
Happened over the weekend lasted approximately 1 hour.  No other symptoms concerning for neurological infarct.  Patient's neurological exam totally benign in office.  Did review signs and symptoms when to be seen emergently.  Will check electrolytes and CBC as patient does have a history of polycythemia secondary likely to tobacco abuse

## 2022-11-25 NOTE — Assessment & Plan Note (Signed)
Having difficulty with sleep since post procedure with his bladder diverticulum.  Patient has tried Doxy olamine over-the-counter but family would like patient to come off of that due to his age and comorbidities.  Will try trazodone 25 to 50 mg nightly as needed.  He can update me via MyChart if this is effective or ineffective

## 2022-11-25 NOTE — Patient Instructions (Signed)
Nice to see you today I have sent in a sleeping pill. Update me on mychart how it is doing. Follow up with me in August for your physical and labs

## 2022-12-06 ENCOUNTER — Other Ambulatory Visit (HOSPITAL_COMMUNITY): Payer: Self-pay

## 2022-12-07 ENCOUNTER — Telehealth: Payer: Self-pay | Admitting: Nurse Practitioner

## 2022-12-07 ENCOUNTER — Other Ambulatory Visit (HOSPITAL_COMMUNITY): Payer: Self-pay

## 2022-12-07 ENCOUNTER — Other Ambulatory Visit: Payer: Self-pay

## 2022-12-07 DIAGNOSIS — E782 Mixed hyperlipidemia: Secondary | ICD-10-CM

## 2022-12-07 DIAGNOSIS — I10 Essential (primary) hypertension: Secondary | ICD-10-CM

## 2022-12-07 NOTE — Telephone Encounter (Signed)
Rx sent to provider

## 2022-12-07 NOTE — Telephone Encounter (Signed)
atorvastatin (LIPITOR) 20 MG tablet benazepril (LOTENSIN) 40 MG tablet hydrochlorothiazide (MICROZIDE) 12.5 MG capsule Last visit 11/25/2022 Next Visit 05/05/2023

## 2022-12-07 NOTE — Telephone Encounter (Signed)
Prescription Request  12/07/2022  LOV: 11/25/2022  What is the name of the medication or equipment?  hydrochlorothiazide (MICROZIDE) 12.5 MG capsule, atorvastatin (LIPITOR) 20 MG tablet, benazepril (LOTENSIN) 40 MG tablet.   Have you contacted your pharmacy to request a refill? No   Which pharmacy would you like this sent to?  [facility]   Patient notified that their request is being sent to the clinical staff for review and that they should receive a response within 2 business days.   Please advise at Mobile [phone] (mobile)

## 2022-12-08 MED ORDER — BENAZEPRIL HCL 40 MG PO TABS: 40.0000 mg | ORAL_TABLET | Freq: Every day | ORAL | 1 refills | Status: DC

## 2022-12-08 MED ORDER — HYDROCHLOROTHIAZIDE 12.5 MG PO CAPS: 12.5000 mg | ORAL_CAPSULE | Freq: Every day | ORAL | 1 refills | Status: DC

## 2022-12-08 MED ORDER — ATORVASTATIN CALCIUM 20 MG PO TABS: 20.0000 mg | ORAL_TABLET | Freq: Every day | ORAL | 1 refills | Status: DC

## 2022-12-13 ENCOUNTER — Other Ambulatory Visit: Payer: Self-pay

## 2022-12-13 ENCOUNTER — Other Ambulatory Visit (HOSPITAL_COMMUNITY): Payer: Self-pay

## 2022-12-13 DIAGNOSIS — I10 Essential (primary) hypertension: Secondary | ICD-10-CM

## 2022-12-13 DIAGNOSIS — E782 Mixed hyperlipidemia: Secondary | ICD-10-CM

## 2022-12-13 NOTE — Telephone Encounter (Signed)
RX sent to provider

## 2022-12-13 NOTE — Telephone Encounter (Signed)
atorvastatin (LIPITOR) 20 MG tablet benazepril (LOTENSIN) 40 MG tablet hydrochlorothiazide (MICROZIDE) 12.5 MG capsule Last visit 11/25/2022 Next 05/05/2023

## 2022-12-13 NOTE — Telephone Encounter (Signed)
Patient needs his medications to go to [facility], he does not have [facility] order  Patient has a couple of weeks left of medication, but needs it sent as soon as possible so that it can be mailed  [facility] order    [phone]

## 2022-12-20 ENCOUNTER — Other Ambulatory Visit (HOSPITAL_COMMUNITY): Payer: Self-pay

## 2022-12-20 ENCOUNTER — Other Ambulatory Visit: Payer: Self-pay

## 2022-12-20 DIAGNOSIS — E782 Mixed hyperlipidemia: Secondary | ICD-10-CM

## 2022-12-20 DIAGNOSIS — I10 Essential (primary) hypertension: Secondary | ICD-10-CM

## 2022-12-20 MED ORDER — ATORVASTATIN CALCIUM 20 MG PO TABS
20.0000 mg | ORAL_TABLET | Freq: Every day | ORAL | 1 refills | Status: DC
  Filled 2022-12-20: qty 90, 90d supply, fill #0
  Filled 2023-03-14: qty 90, 90d supply, fill #1

## 2022-12-20 MED ORDER — BENAZEPRIL HCL 40 MG PO TABS
40.0000 mg | ORAL_TABLET | Freq: Every day | ORAL | 1 refills | Status: DC
  Filled 2022-12-20: qty 90, 90d supply, fill #0
  Filled 2023-03-14: qty 90, 90d supply, fill #1

## 2022-12-20 MED ORDER — HYDROCHLOROTHIAZIDE 12.5 MG PO CAPS
12.5000 mg | ORAL_CAPSULE | Freq: Every day | ORAL | 1 refills | Status: DC
  Filled 2022-12-20: qty 90, 90d supply, fill #0
  Filled 2023-03-14: qty 90, 90d supply, fill #1

## 2022-12-20 NOTE — Telephone Encounter (Signed)
Resent Rx went to wrong pharmacy

## 2022-12-20 NOTE — Telephone Encounter (Signed)
RX called in to [facility].

## 2022-12-20 NOTE — Telephone Encounter (Signed)
Patient called in regarding these medications being sent to  [facility] Phone: [phone]  Fax: [phone]    He said that he has called several times to have this resolved and they are still being sent to the mail order. He would like these rx sent today before noon.

## 2022-12-29 DIAGNOSIS — C61 Malignant neoplasm of prostate: Secondary | ICD-10-CM | POA: Diagnosis not present

## 2023-01-05 DIAGNOSIS — C61 Malignant neoplasm of prostate: Secondary | ICD-10-CM | POA: Diagnosis not present

## 2023-01-05 DIAGNOSIS — N393 Stress incontinence (female) (male): Secondary | ICD-10-CM | POA: Diagnosis not present

## 2023-01-19 DIAGNOSIS — C44629 Squamous cell carcinoma of skin of left upper limb, including shoulder: Secondary | ICD-10-CM | POA: Diagnosis not present

## 2023-01-19 DIAGNOSIS — L570 Actinic keratosis: Secondary | ICD-10-CM | POA: Diagnosis not present

## 2023-01-19 DIAGNOSIS — D485 Neoplasm of uncertain behavior of skin: Secondary | ICD-10-CM | POA: Diagnosis not present

## 2023-02-08 DIAGNOSIS — M5136 Other intervertebral disc degeneration, lumbar region: Secondary | ICD-10-CM | POA: Diagnosis not present

## 2023-02-08 DIAGNOSIS — M9904 Segmental and somatic dysfunction of sacral region: Secondary | ICD-10-CM | POA: Diagnosis not present

## 2023-02-08 DIAGNOSIS — M9903 Segmental and somatic dysfunction of lumbar region: Secondary | ICD-10-CM | POA: Diagnosis not present

## 2023-02-08 DIAGNOSIS — M9905 Segmental and somatic dysfunction of pelvic region: Secondary | ICD-10-CM | POA: Diagnosis not present

## 2023-03-14 ENCOUNTER — Other Ambulatory Visit (HOSPITAL_COMMUNITY): Payer: Self-pay

## 2023-03-16 ENCOUNTER — Encounter: Payer: Self-pay | Admitting: Family Medicine

## 2023-03-16 ENCOUNTER — Ambulatory Visit (INDEPENDENT_AMBULATORY_CARE_PROVIDER_SITE_OTHER): Payer: HMO | Admitting: Family Medicine

## 2023-03-16 VITALS — BP 100/60 | HR 70 | Temp 98.2°F | Ht 71.0 in | Wt 205.4 lb

## 2023-03-16 DIAGNOSIS — R220 Localized swelling, mass and lump, head: Secondary | ICD-10-CM | POA: Diagnosis not present

## 2023-03-16 NOTE — Progress Notes (Signed)
Cameron Alf T. Marshun Duva, MD, CAQ Sports Medicine [facility] [address] [place], [address]  Phone: [phone]  FAX: [phone]  Cameron [name] - 72 y.o. male  MRN [id number]  Date of Birth: 07-27-1951  Date: 03/16/2023  PCP: [name], NP  Referral: [name], NP  Chief Complaint  Patient presents with   Facial Swelling    Hit right side on face on car door on Saturday   Subjective:   Cameron [name] is a 72 y.o. very pleasant male patient with Body mass index is 28.64 kg/m. who presents with the following:  Patient presents for some right-sided facial swelling after hitting his face on a car door on Saturday.  Sunday, daughter was sitting in the passenger seat.  Opened the car door and his hands were full.  When he got out, the tip of the door hit him on the R cheek.  Went to church on [place], she was talking to him, and the side of the face is swollen.    Swelling at the corner of his R jaw.  It is not black and blue.  It is somewhat tender to palpation.  Review of Systems is noted in the HPI, as appropriate  Objective:   BP 100/60 (BP Location: Left Arm, Patient Position: Sitting, Cuff Size: Large)   Pulse 70   Temp 98.2 F (36.8 C) (Temporal)   Ht 5\' 11"  (1.803 m)   Wt 205 lb 6 oz (93.2 kg)   SpO2 95%   BMI 28.64 kg/m   GEN: No acute distress; alert,appropriate. PULM: Breathing comfortably in no respiratory distress PSYCH: Normally interactive.   At the corner of the jaw there is some swelling on the right side.  It is tender to palpation.  The remainder of the head and neck his exam is entirely normal.  Laboratory and Imaging Data:  Assessment and Plan:     ICD-10-CM   1. Right facial swelling  R22.0      Secondary to direct contact.  I think there is some soft tissue swelling and a small hematoma.  I anticipate that this will resolve on its own within 2 to 3 weeks.  Disposition: prn  [employer]  One speech-to-text software was used for transcription in this dictation.  Possible transcriptional errors can occur using Animal nutritionist.   Signed,  [name]. [name], MD   Outpatient Encounter Medications as of 03/16/2023  Medication Sig   albuterol (VENTOLIN HFA) 108 (90 Base) MCG/ACT inhaler Inhale 2 puffs into the lungs every 6 (six) hours as needed for wheezing or shortness of breath.   atorvastatin (LIPITOR) 20 MG tablet Take 1 tablet (20 mg total) by mouth daily.   benazepril (LOTENSIN) 40 MG tablet Take 1 tablet (40 mg total) by mouth daily.   diphenhydrAMINE HCl, Sleep, (SLEEP AID) 25 MG CAPS Take 25 mg by mouth at bedtime.   docusate sodium (COLACE) 100 MG capsule Take 1 capsule (100 mg total) by mouth 2 (two) times daily.   hydrochlorothiazide (MICROZIDE) 12.5 MG capsule Take 1 capsule (12.5 mg total) by mouth daily.   PREVIDENT 5000 BOOSTER PLUS 1.1 % PSTE Place 1 Application onto teeth in the morning and at bedtime.   sildenafil (VIAGRA) 50 MG tablet Take 0.5-2 tablets (25-100 mg total) by mouth daily as needed for erectile dysfunction.   traZODone (DESYREL) 50 MG tablet Take 0.5-1 tablets (25-50 mg total) by mouth at bedtime as  needed for sleep.   [DISCONTINUED] sulfamethoxazole-trimethoprim (BACTRIM DS) 800-160 MG tablet Take 1 tablet by mouth 2 (two) times daily. Start the day prior to foley removal appointment (Patient not taking: Reported on 11/25/2022)   [DISCONTINUED] traMADol (ULTRAM) 50 MG tablet Take 1-2 tablets (50-100 mg total) by mouth every 6 (six) hours as needed for moderate pain or severe pain. (Patient not taking: Reported on 11/25/2022)   No facility-administered encounter medications on file as of 03/16/2023.

## 2023-03-28 ENCOUNTER — Encounter: Payer: Self-pay | Admitting: Family Medicine

## 2023-03-28 ENCOUNTER — Ambulatory Visit (INDEPENDENT_AMBULATORY_CARE_PROVIDER_SITE_OTHER): Payer: HMO | Admitting: Family Medicine

## 2023-03-28 ENCOUNTER — Ambulatory Visit (INDEPENDENT_AMBULATORY_CARE_PROVIDER_SITE_OTHER)
Admission: RE | Admit: 2023-03-28 | Discharge: 2023-03-28 | Disposition: A | Discharge status: Home / Self Care | Payer: HMO | Source: Ambulatory Visit | Attending: Family Medicine | Admitting: Family Medicine

## 2023-03-28 VITALS — BP 96/60 | HR 71 | Temp 98.2°F | Ht 71.0 in | Wt 202.1 lb

## 2023-03-28 DIAGNOSIS — R051 Acute cough: Secondary | ICD-10-CM | POA: Diagnosis not present

## 2023-03-28 DIAGNOSIS — R918 Other nonspecific abnormal finding of lung field: Secondary | ICD-10-CM | POA: Diagnosis not present

## 2023-03-28 DIAGNOSIS — J189 Pneumonia, unspecified organism: Secondary | ICD-10-CM

## 2023-03-28 DIAGNOSIS — J432 Centrilobular emphysema: Secondary | ICD-10-CM

## 2023-03-28 DIAGNOSIS — F17200 Nicotine dependence, unspecified, uncomplicated: Secondary | ICD-10-CM

## 2023-03-28 DIAGNOSIS — J3489 Other specified disorders of nose and nasal sinuses: Secondary | ICD-10-CM

## 2023-03-28 DIAGNOSIS — K439 Ventral hernia without obstruction or gangrene: Secondary | ICD-10-CM

## 2023-03-28 MED ORDER — DOXYCYCLINE HYCLATE 100 MG PO TABS
100.0000 mg | ORAL_TABLET | Freq: Two times a day (BID) | ORAL | 0 refills | Status: DC
Start: 2023-03-28 — End: 2023-05-05

## 2023-03-28 MED ORDER — ALBUTEROL SULFATE HFA 108 (90 BASE) MCG/ACT IN AERS: 2.0000 | INHALATION_SPRAY | Freq: Four times a day (QID) | RESPIRATORY_TRACT | 5 refills | Status: DC | PRN

## 2023-03-28 MED ORDER — VARENICLINE TARTRATE 1 MG PO TABS: 1.0000 mg | ORAL_TABLET | Freq: Two times a day (BID) | ORAL | 3 refills | Status: DC

## 2023-03-28 MED ORDER — VARENICLINE TARTRATE (STARTER) 0.5 MG X 11 & 1 MG X 42 PO TBPK: ORAL_TABLET | ORAL | 0 refills | Status: DC

## 2023-03-28 NOTE — Progress Notes (Signed)
Cameron Mcclatchy T. Zoe Creasman, MD, CAQ [facility] [address] [place], [address]  Phone: [phone]  FAX: [phone]  Cameron [name] - 71 y.o. male  MRN [id number]  Date of Birth: Dec 11, 1950  Date: 03/28/2023  PCP: [name], NP  Referral: [name], NP  Chief Complaint  Patient presents with   Cough   Hernia    Noticed it last Thursday   Nasal Congestion   Fatigue    Wife states patient has been sleeping a lot Wife also states he had a tick bite about a month ago   Subjective:   Cameron [name] is a 72 y.o. very pleasant male patient with Body mass index is 28.19 kg/m. who presents with the following:  The patient presents with multiple medical complaints.  I did see him roughly 2 weeks ago or less with some swelling at the angulation of his right jaw after direct trauma.  Primary care doctor is Mr. Cable.  Fatigued for a couple of weeks.   Coughing a lot.  For about 2-3 weeks.   Smokes about at least a pack - 1 1/2.  Coughig up some phlegm - more like a yellowish  Nasal congestion for 2-3 weeks.   Saturday could not really stay awake.  Was not opening his eyes well.   Wife felt like he was hot.   They also noticed his new small ventral hernia  Review of Systems is noted in the HPI, as appropriate  Objective:   BP 96/60 (BP Location: Left Arm, Patient Position: Sitting, Cuff Size: Large)   Pulse 71   Temp 98.2 F (36.8 C) (Temporal)   Ht 5\' 11"  (1.803 m)   Wt 202 lb 2 oz (91.7 kg)   SpO2 94%   BMI 28.19 kg/m    Gen: WDWN, NAD. Globally Non-toxic HEENT: Throat clear, w/o exudate, R TM clear, L TM - good landmarks, No fluid present. rhinnorhea.  MMM Frontal sinuses: NT Max sinuses: NT NECK: Anterior cervical  LAD is absent CV: RRR, No M/G/R, cap refill <2 sec PULM: Breathing comfortably in no respiratory distress.  Right lower lobe with audible crackles and rhonchi. Small ventral  hernia  Laboratory and Imaging Data:  Assessment and Plan:     ICD-10-CM   1. Community acquired pneumonia of right lower lobe of lung  J18.9     2. Acute cough  R05.1 DG Chest 2 View    3. Ventral hernia without obstruction or gangrene  K43.9     4. Rhinorrhea  J34.89     5. Smoker  F17.200 DG Chest 2 View    6. Centrilobular emphysema (HCC)  J43.2      Greater than 2-week history of productive cough with now some audible crackles and rhonchi in the right lower lobe.  Consistent with acute acquired pneumonia.  Small ventral hernia.  Reassurance.  He also is a smoker and would like to try quitting smoking with some Chantix.  Medication Management during today's office visit: Meds ordered this encounter  Medications   Varenicline Tartrate, Starter, (CHANTIX STARTING MONTH PAK) 0.5 MG X 11 & 1 MG X 42 TBPK    Sig: Take one 0.5mg  tablet by mouth once daily for 3 days, then increase to one 0.5mg  tablet twice daily for 3 days, then increase to one 1mg  tablet twice daily.    Dispense:  53 each    Refill:  0  varenicline (CHANTIX CONTINUING MONTH PAK) 1 MG tablet    Sig: Take 1 tablet (1 mg total) by mouth 2 (two) times daily.    Dispense:  60 tablet    Refill:  3   albuterol (VENTOLIN HFA) 108 (90 Base) MCG/ACT inhaler    Sig: Inhale 2 puffs into the lungs every 6 (six) hours as needed for wheezing or shortness of breath.    Dispense:  1 each    Refill:  5   doxycycline (VIBRA-TABS) 100 MG tablet    Sig: Take 1 tablet (100 mg total) by mouth 2 (two) times daily.    Dispense:  20 tablet    Refill:  0   Medications Discontinued During This Encounter  Medication Reason   docusate sodium (COLACE) 100 MG capsule Completed Course   albuterol (VENTOLIN HFA) 108 (90 Base) MCG/ACT inhaler     Orders placed today for conditions managed today: Orders Placed This Encounter  Procedures   DG Chest 2 View    Disposition: No follow-ups on file.  [employer] One  speech-to-text software was used for transcription in this dictation.  Possible transcriptional errors can occur using Animal nutritionist.   Signed,  [name]. Eveleen Mcnear, MD   Outpatient Encounter Medications as of 03/28/2023  Medication Sig   albuterol (VENTOLIN HFA) 108 (90 Base) MCG/ACT inhaler Inhale 2 puffs into the lungs every 6 (six) hours as needed for wheezing or shortness of breath.   atorvastatin (LIPITOR) 20 MG tablet Take 1 tablet (20 mg total) by mouth daily.   benazepril (LOTENSIN) 40 MG tablet Take 1 tablet (40 mg total) by mouth daily.   diphenhydrAMINE HCl, Sleep, (SLEEP AID) 25 MG CAPS Take 25 mg by mouth at bedtime.   doxycycline (VIBRA-TABS) 100 MG tablet Take 1 tablet (100 mg total) by mouth 2 (two) times daily.   hydrochlorothiazide (MICROZIDE) 12.5 MG capsule Take 1 capsule (12.5 mg total) by mouth daily.   PREVIDENT 5000 BOOSTER PLUS 1.1 % PSTE Place 1 Application onto teeth in the morning and at bedtime.   sildenafil (VIAGRA) 50 MG tablet Take 0.5-2 tablets (25-100 mg total) by mouth daily as needed for erectile dysfunction.   traZODone (DESYREL) 50 MG tablet Take 0.5-1 tablets (25-50 mg total) by mouth at bedtime as needed for sleep.   varenicline (CHANTIX CONTINUING MONTH PAK) 1 MG tablet Take 1 tablet (1 mg total) by mouth 2 (two) times daily.   Varenicline Tartrate, Starter, (CHANTIX STARTING MONTH PAK) 0.5 MG X 11 & 1 MG X 42 TBPK Take one 0.5mg  tablet by mouth once daily for 3 days, then increase to one 0.5mg  tablet twice daily for 3 days, then increase to one 1mg  tablet twice daily.   [DISCONTINUED] albuterol (VENTOLIN HFA) 108 (90 Base) MCG/ACT inhaler Inhale 2 puffs into the lungs every 6 (six) hours as needed for wheezing or shortness of breath.   [DISCONTINUED] docusate sodium (COLACE) 100 MG capsule Take 1 capsule (100 mg total) by mouth 2 (two) times daily.   No facility-administered encounter medications on file as of 03/28/2023.

## 2023-04-05 DIAGNOSIS — B0089 Other herpesviral infection: Secondary | ICD-10-CM | POA: Diagnosis not present

## 2023-04-08 ENCOUNTER — Other Ambulatory Visit: Payer: Self-pay | Admitting: Nurse Practitioner

## 2023-04-08 DIAGNOSIS — I10 Essential (primary) hypertension: Secondary | ICD-10-CM

## 2023-04-08 DIAGNOSIS — J432 Centrilobular emphysema: Secondary | ICD-10-CM

## 2023-04-08 DIAGNOSIS — E782 Mixed hyperlipidemia: Secondary | ICD-10-CM

## 2023-04-08 DIAGNOSIS — C61 Malignant neoplasm of prostate: Secondary | ICD-10-CM

## 2023-04-13 DIAGNOSIS — M9905 Segmental and somatic dysfunction of pelvic region: Secondary | ICD-10-CM | POA: Diagnosis not present

## 2023-04-13 DIAGNOSIS — M9903 Segmental and somatic dysfunction of lumbar region: Secondary | ICD-10-CM | POA: Diagnosis not present

## 2023-04-13 DIAGNOSIS — M9904 Segmental and somatic dysfunction of sacral region: Secondary | ICD-10-CM | POA: Diagnosis not present

## 2023-04-13 DIAGNOSIS — M5136 Other intervertebral disc degeneration, lumbar region: Secondary | ICD-10-CM | POA: Diagnosis not present

## 2023-04-20 DIAGNOSIS — M9905 Segmental and somatic dysfunction of pelvic region: Secondary | ICD-10-CM | POA: Diagnosis not present

## 2023-04-20 DIAGNOSIS — M5136 Other intervertebral disc degeneration, lumbar region: Secondary | ICD-10-CM | POA: Diagnosis not present

## 2023-04-20 DIAGNOSIS — M9904 Segmental and somatic dysfunction of sacral region: Secondary | ICD-10-CM | POA: Diagnosis not present

## 2023-04-20 DIAGNOSIS — M9903 Segmental and somatic dysfunction of lumbar region: Secondary | ICD-10-CM | POA: Diagnosis not present

## 2023-04-25 ENCOUNTER — Other Ambulatory Visit (INDEPENDENT_AMBULATORY_CARE_PROVIDER_SITE_OTHER): Payer: HMO

## 2023-04-25 DIAGNOSIS — C61 Malignant neoplasm of prostate: Secondary | ICD-10-CM

## 2023-04-25 DIAGNOSIS — I10 Essential (primary) hypertension: Secondary | ICD-10-CM | POA: Diagnosis not present

## 2023-04-25 DIAGNOSIS — E782 Mixed hyperlipidemia: Secondary | ICD-10-CM

## 2023-04-25 DIAGNOSIS — J432 Centrilobular emphysema: Secondary | ICD-10-CM | POA: Diagnosis not present

## 2023-04-25 LAB — CBC
HCT: 48.4 % (ref 39.0–52.0)
Hemoglobin: 16 g/dL (ref 13.0–17.0)
MCHC: 33.1 g/dL (ref 30.0–36.0)
MCV: 94.6 fl (ref 78.0–100.0)
Platelets: 256 10*3/uL (ref 150.0–400.0)
RBC: 5.12 Mil/uL (ref 4.22–5.81)
RDW: 14.1 % (ref 11.5–15.5)
WBC: 7.5 10*3/uL (ref 4.0–10.5)

## 2023-04-25 LAB — LIPID PANEL
Cholesterol: 135 mg/dL (ref 0–200)
HDL: 45.3 mg/dL (ref >39.00)
LDL Cholesterol: 78 mg/dL (ref 0–99)
NonHDL: 89.67
Total CHOL/HDL Ratio: 3
Triglycerides: 56 mg/dL (ref 0.0–149.0)
VLDL: 11.2 mg/dL (ref 0.0–40.0)

## 2023-04-25 LAB — COMPREHENSIVE METABOLIC PANEL
ALT: 9 U/L (ref 0–53)
AST: 13 U/L (ref 0–37)
Albumin: 3.9 g/dL (ref 3.5–5.2)
Alkaline Phosphatase: 85 U/L (ref 39–117)
BUN: 9 mg/dL (ref 6–23)
CO2: 30 mEq/L (ref 19–32)
Calcium: 9.2 mg/dL (ref 8.4–10.5)
Chloride: 99 mEq/L (ref 96–112)
Creatinine, Ser: 0.88 mg/dL (ref 0.40–1.50)
GFR: 87.36 mL/min (ref >60.00)
Glucose, Bld: 93 mg/dL (ref 70–99)
Potassium: 4.4 mEq/L (ref 3.5–5.1)
Sodium: 136 mEq/L (ref 135–145)
Total Bilirubin: 0.6 mg/dL (ref 0.2–1.2)
Total Protein: 6.2 g/dL (ref 6.0–8.3)

## 2023-04-25 LAB — TSH: TSH: 1.53 u[IU]/mL (ref 0.35–5.50)

## 2023-04-25 LAB — PSA, MEDICARE: PSA: 0 ng/ml — ABNORMAL LOW (ref 0.10–4.00)

## 2023-04-26 DIAGNOSIS — M9904 Segmental and somatic dysfunction of sacral region: Secondary | ICD-10-CM | POA: Diagnosis not present

## 2023-04-26 DIAGNOSIS — M9905 Segmental and somatic dysfunction of pelvic region: Secondary | ICD-10-CM | POA: Diagnosis not present

## 2023-04-26 DIAGNOSIS — M5136 Other intervertebral disc degeneration, lumbar region: Secondary | ICD-10-CM | POA: Diagnosis not present

## 2023-04-26 DIAGNOSIS — M9903 Segmental and somatic dysfunction of lumbar region: Secondary | ICD-10-CM | POA: Diagnosis not present

## 2023-05-02 DIAGNOSIS — M9905 Segmental and somatic dysfunction of pelvic region: Secondary | ICD-10-CM | POA: Diagnosis not present

## 2023-05-02 DIAGNOSIS — M5136 Other intervertebral disc degeneration, lumbar region: Secondary | ICD-10-CM | POA: Diagnosis not present

## 2023-05-02 DIAGNOSIS — M9904 Segmental and somatic dysfunction of sacral region: Secondary | ICD-10-CM | POA: Diagnosis not present

## 2023-05-02 DIAGNOSIS — M9903 Segmental and somatic dysfunction of lumbar region: Secondary | ICD-10-CM | POA: Diagnosis not present

## 2023-05-05 ENCOUNTER — Other Ambulatory Visit (HOSPITAL_COMMUNITY): Payer: Self-pay

## 2023-05-05 ENCOUNTER — Other Ambulatory Visit: Payer: Self-pay

## 2023-05-05 ENCOUNTER — Ambulatory Visit (INDEPENDENT_AMBULATORY_CARE_PROVIDER_SITE_OTHER): Payer: HMO | Admitting: Nurse Practitioner

## 2023-05-05 ENCOUNTER — Encounter: Payer: Self-pay | Admitting: Nurse Practitioner

## 2023-05-05 VITALS — BP 124/70 | HR 69 | Temp 97.8°F | Ht 71.0 in | Wt 205.6 lb

## 2023-05-05 DIAGNOSIS — J432 Centrilobular emphysema: Secondary | ICD-10-CM | POA: Diagnosis not present

## 2023-05-05 DIAGNOSIS — F17200 Nicotine dependence, unspecified, uncomplicated: Secondary | ICD-10-CM

## 2023-05-05 DIAGNOSIS — E782 Mixed hyperlipidemia: Secondary | ICD-10-CM | POA: Diagnosis not present

## 2023-05-05 DIAGNOSIS — C61 Malignant neoplasm of prostate: Secondary | ICD-10-CM

## 2023-05-05 DIAGNOSIS — Z0000 Encounter for general adult medical examination without abnormal findings: Secondary | ICD-10-CM

## 2023-05-05 DIAGNOSIS — I10 Essential (primary) hypertension: Secondary | ICD-10-CM | POA: Diagnosis not present

## 2023-05-05 DIAGNOSIS — E663 Overweight: Secondary | ICD-10-CM

## 2023-05-05 DIAGNOSIS — Z122 Encounter for screening for malignant neoplasm of respiratory organs: Secondary | ICD-10-CM | POA: Diagnosis not present

## 2023-05-05 LAB — URINALYSIS, MICROSCOPIC ONLY: RBC / HPF: NONE SEEN (ref 0–?)

## 2023-05-05 MED ORDER — FLUTICASONE-SALMETEROL 250-50 MCG/ACT IN AEPB
1.0000 | INHALATION_SPRAY | Freq: Two times a day (BID) | RESPIRATORY_TRACT | 0 refills | Status: DC
Start: 2023-05-05 — End: 2023-08-30
  Filled 2023-05-05 (×2): qty 180, 90d supply, fill #0

## 2023-05-05 NOTE — Assessment & Plan Note (Signed)
Patient currently on benazepril and hydrochlorothiazide.  Tolerates medication well.  Blood pressure under good control continue medication as prescribed

## 2023-05-05 NOTE — Assessment & Plan Note (Signed)
Discussed age-appropriate immunizations and screening exams.  Did review patient's personal, surgical, social, family histories.  Patient is up-to-date on all age-appropriate vaccinations.  He does need a shingles vaccine he can obtain this at his local pharmacy we did discuss this in office today.  Patient thinks he is up-to-date with CRC screening states he stopped by [facility] and they told him he was not due until next year.  Patient up-to-date on prostate cancer screening with history of prostatectomy.  Patient was given information at discharge about preventative healthcare maintenance with anticipatory guidance

## 2023-05-05 NOTE — Assessment & Plan Note (Signed)
Patient currently maintained on atorvastatin 20 mg daily.  Lipids within normal limits continue medication as prescribed

## 2023-05-05 NOTE — Assessment & Plan Note (Signed)
History of same patient has had prostatectomy PSA 0

## 2023-05-05 NOTE — Assessment & Plan Note (Signed)
Noted on low-dose CT scan of chest.  Patient does albuterol inhaler many times a day will start patient on Advair inhaler 1 puff twice daily rinse mouth after each use.  Let patient follow-up encourage patient to try to decrease use of albuterol inhaler.  I did like to use it less than once a week

## 2023-05-05 NOTE — Assessment & Plan Note (Signed)
History of the same.  Still currently smoking ambulatory furl to low-dose CT scan for lung cancer screening.  Check urine for microscopic hematuria patient tried Chantix but it was too expensive

## 2023-05-05 NOTE — Progress Notes (Signed)
Established Patient Office Visit  Subjective   Patient ID: Cameron [name], male    DOB: Mar 04, 1951  Age: 72 y.o. MRN: [id number]  Chief Complaint  Patient presents with   Annual Exam    HPI  Emphysema: This is been noted on CT scan of chest for lung cancer screening.  Patient does have albuterol inhaler states he uses it many times a day.  Unclear if it is for true needed or just because it said he can use it that often.  Patient and patient's spouse does state that he will wheeze at night will put patient on maintenance inhaler with albuterol as needed  HTN: Patient currently maintained on benazepril 40 mg, hydrochlorothiazide 12.5 mg.  ED: Patient currently maintained on sildenafil 50 mg as needed.  Post prostatectomy  HLD: Patient currently maintained on atorvastatin 20 mg daily  for complete physical and follow up of chronic conditions.  Immunizations: -Tetanus: Completed in 2021 -Influenza: Completed this season -Shingles: Obtain at local pharmacy -Pneumonia: Completed pneumococcal 23 and Prevnar 13  Diet: Fair diet. 2 meals and a light lunch.  Sometimes he will snack. Coffee water soda and tea   Exercise: No regular exercise. States that he will walk a lot.   Eye exam: PRN Dental exam: Completes semi-annually    Colonoscopy: Completed in 2016, recall in 5 years patient was due in 2021. States due 2025 Lung Cancer Screening: Overdue last CT scan of the chest 05/30/2019.  Ambulatory order placed today  PSA: Recently completed.  Patient's PSA is 0.00 due to prostatectomy  Sleep: state he will go to bed aroun 1030 and get up around 7. Feels rested. Does snore      Review of Systems  Constitutional:  Negative for chills and fever.  Respiratory:  Negative for shortness of breath.   Cardiovascular:  Negative for chest pain and leg swelling.  Gastrointestinal:  Negative for abdominal pain, blood in stool, constipation, diarrhea, nausea and vomiting.       BM daily    Genitourinary:  Negative for dysuria and hematuria.  Neurological:  Negative for dizziness, tingling and headaches.  Psychiatric/Behavioral:  Negative for hallucinations and suicidal ideas.       Objective:     BP 124/70   Pulse 69   Temp 97.8 F (36.6 C) (Temporal)   Ht 5\' 11"  (1.803 m)   Wt 205 lb 9.6 oz (93.3 kg)   SpO2 98%   BMI 28.68 kg/m    Physical Exam Vitals and nursing note reviewed.  Constitutional:      Appearance: Normal appearance.  HENT:     Right Ear: Tympanic membrane, ear canal and external ear normal.     Left Ear: Tympanic membrane, ear canal and external ear normal.     Mouth/Throat:     Mouth: Mucous membranes are moist.     Pharynx: Oropharynx is clear.  Eyes:     Extraocular Movements: Extraocular movements intact.     Pupils: Pupils are equal, round, and reactive to light.  Cardiovascular:     Rate and Rhythm: Normal rate and regular rhythm.     Pulses: Normal pulses.     Heart sounds: Normal heart sounds.  Pulmonary:     Effort: Pulmonary effort is normal.     Breath sounds: Normal breath sounds.  Abdominal:     General: Bowel sounds are normal. There is no distension.     Palpations: There is no mass.     Tenderness: There  is no abdominal tenderness.     Hernia: A hernia is present.    Musculoskeletal:     Right lower leg: No edema.     Left lower leg: No edema.  Lymphadenopathy:     Cervical: No cervical adenopathy.  Skin:    General: Skin is warm.  Neurological:     General: No focal deficit present.     Mental Status: He is alert.     Deep Tendon Reflexes:     Reflex Scores:      Bicep reflexes are 2+ on the right side and 2+ on the left side.      Patellar reflexes are 2+ on the right side and 2+ on the left side.    Comments: Bilateral upper and lower extremity strength 5/5  Psychiatric:        Mood and Affect: Mood normal.        Behavior: Behavior normal.        Thought Content: Thought content normal.         Judgment: Judgment normal.      No results found for any visits on 05/05/23.    The 10-year ASCVD risk score (Arnett DK, et al., 2019) is: 20.7%    Assessment & Plan:   Problem List Items Addressed This Visit       Cardiovascular and Mediastinum   HTN (hypertension), benign    Patient currently on benazepril and hydrochlorothiazide.  Tolerates medication well.  Blood pressure under good control continue medication as prescribed        Respiratory   Centrilobular emphysema (HCC) - Primary    Noted on low-dose CT scan of chest.  Patient does albuterol inhaler many times a day will start patient on Advair inhaler 1 puff twice daily rinse mouth after each use.  Let patient follow-up encourage patient to try to decrease use of albuterol inhaler.  I did like to use it less than once a week      Relevant Medications   fluticasone-salmeterol (ADVAIR DISKUS) 250-50 MCG/ACT AEPB     Genitourinary   Prostate cancer (HCC)    History of same patient has had prostatectomy PSA 0      Relevant Medications   valACYclovir (VALTREX) 1000 MG tablet     Other   Hyperlipemia, mixed    Patient currently maintained on atorvastatin 20 mg daily.  Lipids within normal limits continue medication as prescribed      Tobacco dependence    History of the same.  Still currently smoking ambulatory furl to low-dose CT scan for lung cancer screening.  Check urine for microscopic hematuria patient tried Chantix but it was too expensive      Relevant Orders   Urine Microscopic   Overweight (BMI 25.0-29.9)    Continue working on healthy lifestyle modifications      Preventative health care    Discussed age-appropriate immunizations and screening exams.  Did review patient's personal, surgical, social, family histories.  Patient is up-to-date on all age-appropriate vaccinations.  He does need a shingles vaccine he can obtain this at his local pharmacy we did discuss this in office today.  Patient  thinks he is up-to-date with CRC screening states he stopped by [facility] and they told him he was not due until next year.  Patient up-to-date on prostate cancer screening with history of prostatectomy.  Patient was given information at discharge about preventative healthcare maintenance with anticipatory guidance       Return in  about 3 months (around 08/05/2023) for COPD recheck .    Audria Nine, NP

## 2023-05-05 NOTE — Assessment & Plan Note (Signed)
Continue working on healthy lifestyle modifications 

## 2023-05-05 NOTE — Patient Instructions (Addendum)
Nice to see you today I have sent in an inhaler that you will use twice a day. Rinse your mouth out after each use Follow up in 3 months to see how the inhaler is doing

## 2023-05-10 DIAGNOSIS — M9904 Segmental and somatic dysfunction of sacral region: Secondary | ICD-10-CM | POA: Diagnosis not present

## 2023-05-10 DIAGNOSIS — M5136 Other intervertebral disc degeneration, lumbar region: Secondary | ICD-10-CM | POA: Diagnosis not present

## 2023-05-10 DIAGNOSIS — M9905 Segmental and somatic dysfunction of pelvic region: Secondary | ICD-10-CM | POA: Diagnosis not present

## 2023-05-10 DIAGNOSIS — M9903 Segmental and somatic dysfunction of lumbar region: Secondary | ICD-10-CM | POA: Diagnosis not present

## 2023-05-16 DIAGNOSIS — M9905 Segmental and somatic dysfunction of pelvic region: Secondary | ICD-10-CM | POA: Diagnosis not present

## 2023-05-16 DIAGNOSIS — D2239 Melanocytic nevi of other parts of face: Secondary | ICD-10-CM | POA: Diagnosis not present

## 2023-05-16 DIAGNOSIS — D225 Melanocytic nevi of trunk: Secondary | ICD-10-CM | POA: Diagnosis not present

## 2023-05-16 DIAGNOSIS — L821 Other seborrheic keratosis: Secondary | ICD-10-CM | POA: Diagnosis not present

## 2023-05-16 DIAGNOSIS — L570 Actinic keratosis: Secondary | ICD-10-CM | POA: Diagnosis not present

## 2023-05-16 DIAGNOSIS — M9904 Segmental and somatic dysfunction of sacral region: Secondary | ICD-10-CM | POA: Diagnosis not present

## 2023-05-16 DIAGNOSIS — L814 Other melanin hyperpigmentation: Secondary | ICD-10-CM | POA: Diagnosis not present

## 2023-05-16 DIAGNOSIS — M5136 Other intervertebral disc degeneration, lumbar region: Secondary | ICD-10-CM | POA: Diagnosis not present

## 2023-05-16 DIAGNOSIS — Z85828 Personal history of other malignant neoplasm of skin: Secondary | ICD-10-CM | POA: Diagnosis not present

## 2023-05-16 DIAGNOSIS — L720 Epidermal cyst: Secondary | ICD-10-CM | POA: Diagnosis not present

## 2023-05-16 DIAGNOSIS — D2272 Melanocytic nevi of left lower limb, including hip: Secondary | ICD-10-CM | POA: Diagnosis not present

## 2023-05-16 DIAGNOSIS — M9903 Segmental and somatic dysfunction of lumbar region: Secondary | ICD-10-CM | POA: Diagnosis not present

## 2023-05-16 DIAGNOSIS — D1801 Hemangioma of skin and subcutaneous tissue: Secondary | ICD-10-CM | POA: Diagnosis not present

## 2023-06-01 ENCOUNTER — Other Ambulatory Visit: Payer: Self-pay

## 2023-06-01 DIAGNOSIS — Z87891 Personal history of nicotine dependence: Secondary | ICD-10-CM

## 2023-06-01 DIAGNOSIS — C44629 Squamous cell carcinoma of skin of left upper limb, including shoulder: Secondary | ICD-10-CM | POA: Diagnosis not present

## 2023-06-01 DIAGNOSIS — Z85828 Personal history of other malignant neoplasm of skin: Secondary | ICD-10-CM | POA: Diagnosis not present

## 2023-06-01 DIAGNOSIS — D485 Neoplasm of uncertain behavior of skin: Secondary | ICD-10-CM | POA: Diagnosis not present

## 2023-06-01 DIAGNOSIS — Z122 Encounter for screening for malignant neoplasm of respiratory organs: Secondary | ICD-10-CM

## 2023-06-01 DIAGNOSIS — F17210 Nicotine dependence, cigarettes, uncomplicated: Secondary | ICD-10-CM

## 2023-06-17 ENCOUNTER — Other Ambulatory Visit (HOSPITAL_COMMUNITY): Payer: Self-pay

## 2023-06-17 ENCOUNTER — Other Ambulatory Visit: Payer: Self-pay | Admitting: Nurse Practitioner

## 2023-06-17 DIAGNOSIS — E782 Mixed hyperlipidemia: Secondary | ICD-10-CM

## 2023-06-17 DIAGNOSIS — I10 Essential (primary) hypertension: Secondary | ICD-10-CM

## 2023-06-18 ENCOUNTER — Other Ambulatory Visit (HOSPITAL_COMMUNITY): Payer: Self-pay

## 2023-06-20 ENCOUNTER — Other Ambulatory Visit (HOSPITAL_COMMUNITY): Payer: Self-pay

## 2023-06-20 MED ORDER — ATORVASTATIN CALCIUM 20 MG PO TABS
20.0000 mg | ORAL_TABLET | Freq: Every day | ORAL | 1 refills | Status: DC
Start: 2023-06-20 — End: 2023-12-21
  Filled 2023-06-20: qty 90, 90d supply, fill #0
  Filled 2023-09-15: qty 90, 90d supply, fill #1

## 2023-06-20 MED ORDER — BENAZEPRIL HCL 40 MG PO TABS
40.0000 mg | ORAL_TABLET | Freq: Every day | ORAL | 1 refills | Status: DC
Start: 2023-06-20 — End: 2023-12-21
  Filled 2023-06-20: qty 90, 90d supply, fill #0
  Filled 2023-09-15: qty 90, 90d supply, fill #1

## 2023-06-20 MED ORDER — HYDROCHLOROTHIAZIDE 12.5 MG PO CAPS
12.5000 mg | ORAL_CAPSULE | Freq: Every day | ORAL | 1 refills | Status: DC
Start: 2023-06-20 — End: 2023-12-21
  Filled 2023-06-20: qty 90, 90d supply, fill #0
  Filled 2023-09-15: qty 90, 90d supply, fill #1

## 2023-06-22 ENCOUNTER — Other Ambulatory Visit (HOSPITAL_COMMUNITY): Payer: Self-pay

## 2023-06-28 ENCOUNTER — Encounter: Payer: Self-pay | Admitting: Internal Medicine

## 2023-06-28 ENCOUNTER — Ambulatory Visit (INDEPENDENT_AMBULATORY_CARE_PROVIDER_SITE_OTHER): Payer: HMO | Admitting: Internal Medicine

## 2023-06-28 VITALS — BP 110/60 | HR 82 | Temp 97.8°F | Ht 71.0 in | Wt 209.0 lb

## 2023-06-28 DIAGNOSIS — J22 Unspecified acute lower respiratory infection: Secondary | ICD-10-CM | POA: Diagnosis not present

## 2023-06-28 MED ORDER — DOXYCYCLINE HYCLATE 100 MG PO TABS: 100.0000 mg | ORAL_TABLET | Freq: Two times a day (BID) | ORAL | 0 refills | Status: DC

## 2023-06-28 NOTE — Assessment & Plan Note (Signed)
Has some degree of lung disease Will treat empirically with doxy 100 bid x 7 days Discussed cigarette cessation If breathing worsens, would try prednisone

## 2023-06-28 NOTE — Progress Notes (Signed)
Subjective:    Patient ID: Cameron [name], male    DOB: 12/14/1950, 72 y.o.   MRN: [id number]  HPI Here due to respiratory illness With wife  Has been coughing for 5 days or so Some low grade fever last night Tends to have this at this time of year Green sputum Still smoking No SOB No sore throat or ear pain  Tried alka seltzer and flonase  Current Outpatient Medications on File Prior to Visit  Medication Sig Dispense Refill   albuterol (VENTOLIN HFA) 108 (90 Base) MCG/ACT inhaler Inhale 2 puffs into the lungs every 6 (six) hours as needed for wheezing or shortness of breath. 1 each 5   atorvastatin (LIPITOR) 20 MG tablet Take 1 tablet (20 mg total) by mouth daily. 90 tablet 1   benazepril (LOTENSIN) 40 MG tablet Take 1 tablet (40 mg total) by mouth daily. 90 tablet 1   diphenhydrAMINE HCl, Sleep, (SLEEP AID) 25 MG CAPS Take 25 mg by mouth at bedtime.     fluticasone-salmeterol (ADVAIR DISKUS) 250-50 MCG/ACT AEPB Inhale 1 puff into the lungs in the morning and at bedtime. 180 each 0   hydrochlorothiazide (MICROZIDE) 12.5 MG capsule Take 1 capsule (12.5 mg total) by mouth daily. 90 capsule 1   sildenafil (VIAGRA) 50 MG tablet Take 0.5-2 tablets (25-100 mg total) by mouth daily as needed for erectile dysfunction. 30 tablet 3   traZODone (DESYREL) 50 MG tablet Take 0.5-1 tablets (25-50 mg total) by mouth at bedtime as needed for sleep. 30 tablet 0   valACYclovir (VALTREX) 1000 MG tablet Take 1,000 mg by mouth 2 (two) times daily.     No current facility-administered medications on file prior to visit.    No Known Allergies  Past Medical History:  Diagnosis Date   Adenomatous colon polyp 05/2009; 11/2014   2010:Tubular adenoma, no high grade dysplasia.Marland Kitchen  + Hyperplastic polyps. 2016: Tubular adenoma-rpt 5 yrs.   Cancer [facility])    TURP for prostate cancer   Coronary artery disease    H/O chronic gastritis 05/2009   EGD: no h.pylori,dysplasia,or evidence of malignancy    Hyperlipidemia    lovaza from prior PMD-stopped due to cost   Hypertension    Obesity, Class I, BMI 30.0-34.9 (see actual BMI) 04/23/2014   Tobacco dependence     Past Surgical History:  Procedure Laterality Date   APPENDECTOMY  09/28/1971   BLADDER DIVERTICULECTOMY N/A 09/30/2022   Procedure: BLADDER DIVERTICULECTOMY;  Surgeon: Heloise Purpura, MD;  Location: [facility];  Service: Urology;  Laterality: N/A;   CARPAL TUNNEL RELEASE Left    CHOLECYSTECTOMY N/A 07/06/2018   Procedure: LAPAROSCOPIC CHOLECYSTECTOMY WITH INTRAOPERATIVE CHOLANGIOGRAM;  Surgeon: Ovidio Kin, MD;  Location: [facility] OR;  Service: General;  Laterality: N/A;   COLONOSCOPY  09/27/2008   [facility]   CYSTOSCOPY N/A 09/30/2022   Procedure: Derinda Late;  Surgeon: Heloise Purpura, MD;  Location: [facility];  Service: Urology;  Laterality: N/A;   EYE SURGERY  2018   Cataracts removed only   FINGER SURGERY Left 12/2021   Left middle finger, laceration   ROBOT ASSISTED LAPAROSCOPIC RADICAL PROSTATECTOMY N/A 09/30/2022   Procedure: XI ROBOTIC ASSISTED LAPAROSCOPIC RADICAL PROSTATECTOMY LEVEL 3;  Surgeon: Heloise Purpura, MD;  Location: [facility];  Service: Urology;  Laterality: N/A;  270 MINUTES NEEDED FOR CASE   STRABISMUS SURGERY  age 88 or 9   TRANSURETHRAL RESECTION OF PROSTATE N/A 07/08/2022   Procedure: TRANSURETHRAL RESECTION OF THE PROSTATE (TURP)/ CYSTOSCOPY;  Surgeon: Laverle Patter,  Konrad Dolores, MD;  Location: [facility];  Service: Urology;  Laterality: N/A;    Family History  Problem Relation Age of Onset   Diabetes Mother    Heart disease Mother    COPD Father    Hypertension Father    Hyperlipidemia Father    Heart attack Father    Dementia Father    Alcohol abuse Maternal Grandfather     Social History   Socioeconomic History   Marital status: Widowed    Spouse name: Not on file   Number of children: 2   Years of education: Not on file   Highest education level: Not on file  Occupational History   Occupation: Retired     Comment: 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   Works with entertainment companies to set up venues    Orig from [place], has lived in [place] since 1970.   Tobacco 80 pack-yr hx, ongoing as of 03/2014.   Rare alcohol.  No drug use except DISTANT use of marijuana.   Social Determinants of Health   Financial Resource Strain: Low Risk  (11/16/2022)   Overall Financial Resource Strain (CARDIA)    Difficulty of Paying Living Expenses: Not hard at all  Food Insecurity: No Food Insecurity (11/16/2022)   Hunger Vital Sign    Worried About Running Out of Food in the Last Year: Never true    Ran Out of Food in the Last Year: Never true  Transportation Needs: No Transportation Needs (11/16/2022)   PRAPARE - Administrator, Civil Service (Medical): No    Lack of Transportation (Non-Medical): No  Physical Activity: Insufficiently Active (11/16/2022)   Exercise Vital Sign    Days of Exercise per Week: 3 days    Minutes of Exercise per Session: 30 min  Stress: No Stress Concern Present (11/16/2022)   [employer] of Occupational Health - Occupational Stress Questionnaire    Feeling of Stress : Not at all  Social Connections: Moderately Integrated (11/16/2022)   Social Connection and Isolation Panel [NHANES]    Frequency of Communication with Friends and Family: More than three times a week    Frequency of Social Gatherings with Friends and Family: Once a week    Attends Religious Services: More than 4 times per year    Active Member of  [employer] or Organizations: No    Attends Banker Meetings: Never    Marital Status: Married  Catering manager Violence: Not At Risk (11/16/2022)   Humiliation, Afraid, Rape, and Kick questionnaire    Fear of Current or Ex-Partner: No    Emotionally Abused: No    Physically Abused: No    Sexually Abused: No   Review of Systems No change in smell or taste No N/V Eating okay    Objective:   Physical Exam Constitutional:      Appearance: Normal appearance.  HENT:     Head:     Comments: No sinus  tenderness    Right Ear: Tympanic membrane and ear canal normal.     Left Ear: Tympanic membrane and ear canal normal.     Mouth/Throat:     Pharynx: No oropharyngeal exudate or posterior oropharyngeal erythema.  Pulmonary:     Effort: Pulmonary effort is normal.     Breath sounds: No rales.     Comments: Very slight prolonged expiration and rhonchi Neurological:     Mental Status: He is alert.            Assessment & Plan:

## 2023-06-29 ENCOUNTER — Encounter: Payer: HMO | Admitting: Acute Care

## 2023-06-29 ENCOUNTER — Ambulatory Visit (INDEPENDENT_AMBULATORY_CARE_PROVIDER_SITE_OTHER): Payer: HMO | Admitting: Nurse Practitioner

## 2023-06-29 DIAGNOSIS — F17210 Nicotine dependence, cigarettes, uncomplicated: Secondary | ICD-10-CM | POA: Diagnosis not present

## 2023-06-29 NOTE — Patient Instructions (Signed)
 Thank you for participating in the  Lung Cancer Screening Program. It was our pleasure to meet you today. We will call you with the results of your scan within the next few days. Your scan will be assigned a Lung RADS category score by the physicians reading the scans.  This Lung RADS score determines follow up scanning.  See below for description of categories, and follow up screening recommendations. We will be in touch to schedule your follow up screening annually or based on recommendations of our providers. We will fax a copy of your scan results to your Primary Care Physician, or the physician who referred you to the program, to ensure they have the results. Please call the office if you have any questions or concerns regarding your scanning experience or results.  Our office number is [phone]. Please speak with Abigail Miyamoto, RN., Karlton Lemon RN, or Pietro Cassis RN. They are  our Lung Cancer Screening RN.'s If They are unavailable when you call, Please leave a message on the voice mail. We will return your call at our earliest convenience.This voice mail is monitored several times a day.  Remember, if your scan is normal, we will scan you annually as long as you continue to meet the criteria for the program. (Age 70-80, Current smoker or smoker who has quit within the last 15 years). If you are a smoker, remember, quitting is the single most powerful action that you can take to decrease your risk of lung cancer and other pulmonary, breathing related problems. We know quitting is hard, and we are here to help.  Please let us know if there is anything we can do to help you meet your goal of quitting. If you are a former smoker, Counselling psychologist. We are proud of you! Remain smoke free! Remember you can refer friends or family members through the number above.  We will screen them to make sure they meet criteria for the program. Thank you for helping [place] take better care of you  by participating in Lung Screening.   Lung RADS Categories:  Lung RADS 1: no nodules or definitely non-concerning nodules.  Recommendation is for a repeat annual scan in 12 months.  Lung RADS 2:  nodules that are non-concerning in appearance and behavior with a very low likelihood of becoming an active cancer. Recommendation is for a repeat annual scan in 12 months.  Lung RADS 3: nodules that are probably non-concerning , includes nodules with a low likelihood of becoming an active cancer.  Recommendation is for a 40-month repeat screening scan. Often noted after an upper respiratory illness. We will be in touch to make sure you have no questions, and to schedule your 22-month scan.  Lung RADS 4 A: nodules with concerning findings, recommendation is most often for a follow up scan in 3 months or additional testing based on our provider's assessment of the scan. We will be in touch to make sure you have no questions and to schedule the recommended 3 month follow up scan.  Lung RADS 4 B:  indicates findings that are concerning. We will be in touch with you to schedule additional diagnostic testing based on our provider's  assessment of the scan.  You can receive free nicotine replacement therapy ( patches, gum or mints) by calling 1-800-QUIT NOW. Please call so we can get you on the path to becoming  a non-smoker. I know it is hard, but you can do this!  Other options for assistance in  smoking cessation ( As covered by your insurance benefits)  Hypnosis for smoking cessation  [employer] [phone]  Acupuncture for smoking cessation  [employer] [phone]

## 2023-06-29 NOTE — Progress Notes (Signed)
  Virtual Visit via Telephone Note  I connected with Rylen Swindler Marquina on 06/29/23 at  2:00 PM EDT by telephone and verified that I am speaking with the correct person using two identifiers.  Location:   Remote Patient: Cameron [name] Provider: Posey Boyer   Shared Decision Making Visit Lung Cancer Screening Program [phone])   Eligibility: Age 72 y.o. Pack Years Smoking History Calculation 60 (# packs/per year x # years smoked) Recent History of coughing up blood  no Unexplained weight loss? no ( >Than 15 pounds within the last 6 months ) Prior History Lung / other cancer yes, prostate (Diagnosis within the last 5 years already requiring surveillance chest CT Scans). Smoking Status Current Smoker  Visit Components: Discussion included one or more decision making aids. yes Discussion included risk/benefits of screening. yes Discussion included potential follow up diagnostic testing for abnormal scans. yes Discussion included meaning and risk of over diagnosis. yes Discussion included meaning and risk of False Positives. yes Discussion included meaning of total radiation exposure. yes  Counseling Included: Importance of adherence to annual lung cancer LDCT screening. yes Impact of comorbidities on ability to participate in the program. yes Ability and willingness to under diagnostic treatment. yes  Smoking Cessation Counseling: Current Smokers:  Discussed importance of smoking cessation. yes Information about tobacco cessation classes and interventions provided to patient. yes Patient provided with "ticket" for LDCT Scan. N/a Symptomatic Patient. no  Counseling(Intermediate counseling: > three minutes) [address]406 Diagnosis Code: Tobacco Use Z72.0 Written Order for Lung Cancer Screening with LDCT placed in Epic.  (CT Chest Lung Cancer Screening Low Dose W/O CM) [id number] Z12.2-Screening of respiratory organs Z87.891-Personal history of nicotine dependence   Posey Boyer,  NP

## 2023-06-30 ENCOUNTER — Ambulatory Visit
Admission: RE | Admit: 2023-06-30 | Discharge: 2023-06-30 | Disposition: A | Discharge status: Home / Self Care | Payer: HMO | Source: Ambulatory Visit | Attending: Nurse Practitioner | Admitting: Nurse Practitioner

## 2023-06-30 DIAGNOSIS — F17210 Nicotine dependence, cigarettes, uncomplicated: Secondary | ICD-10-CM

## 2023-06-30 DIAGNOSIS — Z122 Encounter for screening for malignant neoplasm of respiratory organs: Secondary | ICD-10-CM

## 2023-06-30 DIAGNOSIS — Z87891 Personal history of nicotine dependence: Secondary | ICD-10-CM

## 2023-07-08 DIAGNOSIS — H02841 Edema of right upper eyelid: Secondary | ICD-10-CM | POA: Diagnosis not present

## 2023-07-08 DIAGNOSIS — H25813 Combined forms of age-related cataract, bilateral: Secondary | ICD-10-CM | POA: Diagnosis not present

## 2023-07-08 DIAGNOSIS — H11431 Conjunctival hyperemia, right eye: Secondary | ICD-10-CM | POA: Diagnosis not present

## 2023-07-08 DIAGNOSIS — H0011 Chalazion right upper eyelid: Secondary | ICD-10-CM | POA: Diagnosis not present

## 2023-07-08 DIAGNOSIS — H0102A Squamous blepharitis right eye, upper and lower eyelids: Secondary | ICD-10-CM | POA: Diagnosis not present

## 2023-07-13 ENCOUNTER — Telehealth: Payer: Self-pay | Admitting: Nurse Practitioner

## 2023-07-13 ENCOUNTER — Other Ambulatory Visit: Payer: Self-pay | Admitting: Acute Care

## 2023-07-13 ENCOUNTER — Telehealth: Payer: Self-pay | Admitting: Acute Care

## 2023-07-13 DIAGNOSIS — R918 Other nonspecific abnormal finding of lung field: Secondary | ICD-10-CM

## 2023-07-13 NOTE — Telephone Encounter (Signed)
I have called the patient with the results of his low dose CT chest.  His scan was read as a lung RADS 0, per radiology there is a peripheral masslike soft tissue fullness associated with narrowing of the left upper lobe bronchus.  There is also peribronchovascular nodularity and mucoid impaction and consolidation in the left upper lobe.  These findings are concerning for a centrally obstructing malignancy with postobstructive pneumonitis in the left upper lobe.  Recommendation is for a diagnostic CT chest with contrast to further evaluate. I have explained this to Cameron [name] and his wife, they are in agreement with a diagnostic CT chest with contrast.  I have placed the order and asked that it be done as soon as possible.  I have also ordered a  bmet to evaluate his renal function as it has not been checked in a while. Sherre Lain, and [place], please fax results to PCP and let them know plan is for a diagnostic CT chest with contrast.  Let them know we will keep them in the loop regarding the findings. We will need to keep an eye on this scan as we may need to get it read by one of our physicians as it is taking so long for the scans to get read. Thank so much

## 2023-07-13 NOTE — Telephone Encounter (Signed)
Patient called in and stated that he received results from his CT scan on MyChart. He would like someone to give him a call because he has some question regarding the results.

## 2023-07-13 NOTE — Telephone Encounter (Signed)
Tiffany calling with stat call report. For  CT scan. Tiffany phone number is [phone].

## 2023-07-13 NOTE — Telephone Encounter (Signed)
Call report received:  IMPRESSION: 1. Left perihilar masslike soft tissue fullness with associated narrowing of the left upper lobe bronchus, peribronchovascular nodularity, mucoid impaction and consolidation in the left upper lobe. Findings are highly worrisome for a centrally obstructing malignancy with postobstructive pneumonitis in the left upper lobe. Recommend diagnostic CT chest with contrast in further evaluation. Lung-RADS 0, incomplete. Additional lung cancer screening CT images/or comparison to prior chest CT examinations is needed. These results will be called to the ordering clinician or representative by the Radiologist Assistant, and communication documented in the PACS or [employer]. 2. Aortic atherosclerosis (ICD10-I70.0). Coronary artery calcification. 3.  Emphysema (ICD10-J43.9).

## 2023-07-14 ENCOUNTER — Telehealth: Payer: Self-pay | Admitting: Acute Care

## 2023-07-14 NOTE — Telephone Encounter (Signed)
Results/plan faxed to PCP

## 2023-07-14 NOTE — Telephone Encounter (Signed)
Patient and his wife had multiple additional questions about what we discussed yesterday. I have reviewed again that the plan is for a CT Chest with contrast to better evaluate the occlusion in the left perihilar area causing narrowing of the left upper lobe bronchus. We discussed the plan for about 15 minutes. They did not have any further questions at completion of the call.

## 2023-07-14 NOTE — Telephone Encounter (Signed)
This was ordered through the lung cancer screening program. I will forward them the message

## 2023-07-16 ENCOUNTER — Ambulatory Visit (HOSPITAL_BASED_OUTPATIENT_CLINIC_OR_DEPARTMENT_OTHER)
Admission: RE | Admit: 2023-07-16 | Discharge: 2023-07-16 | Disposition: A | Discharge status: Home / Self Care | Payer: HMO | Source: Ambulatory Visit | Attending: Acute Care | Admitting: Acute Care

## 2023-07-16 ENCOUNTER — Encounter (HOSPITAL_BASED_OUTPATIENT_CLINIC_OR_DEPARTMENT_OTHER): Payer: Self-pay

## 2023-07-16 ENCOUNTER — Other Ambulatory Visit: Payer: Self-pay | Admitting: Acute Care

## 2023-07-16 ENCOUNTER — Other Ambulatory Visit (HOSPITAL_BASED_OUTPATIENT_CLINIC_OR_DEPARTMENT_OTHER): Payer: Self-pay | Admitting: Acute Care

## 2023-07-16 DIAGNOSIS — R918 Other nonspecific abnormal finding of lung field: Secondary | ICD-10-CM

## 2023-07-16 DIAGNOSIS — J432 Centrilobular emphysema: Secondary | ICD-10-CM | POA: Diagnosis not present

## 2023-07-16 MED ORDER — IOHEXOL 300 MG/ML  SOLN
75.0000 mL | Freq: Once | INTRAMUSCULAR | Status: AC | PRN
  Administered 2023-07-16: 75 mL via INTRAVENOUS

## 2023-07-19 ENCOUNTER — Telehealth: Payer: Self-pay | Admitting: *Deleted

## 2023-07-19 NOTE — Telephone Encounter (Signed)
Spoke with pt's wife and advised that CT results have not been read yet, She states that she was told that we would have a pulmonologist read the films if results were not back. Sending to Kandice Robinsons, NP to advise.

## 2023-07-19 NOTE — Telephone Encounter (Signed)
Wife calling for last CT results. Tsf to [place] but wanted to note chart. She is concerned and states Ms. [place] Lodge was to have a Dr. Review CT and contact her. Please call wife @ [phone]

## 2023-07-20 ENCOUNTER — Other Ambulatory Visit: Payer: Self-pay | Admitting: Acute Care

## 2023-07-20 ENCOUNTER — Telehealth: Payer: Self-pay | Admitting: Acute Care

## 2023-07-20 DIAGNOSIS — R911 Solitary pulmonary nodule: Secondary | ICD-10-CM

## 2023-07-20 NOTE — Telephone Encounter (Signed)
I have called the patient with the results of the  diagnostic CT scan done as follow up to the screening scan to better evaluate the persistent soft tissue of fullness around the left upper lobe bronchus. There is been significant improvement in his postobstructive pneumonitis and radiology feel this soft tissue fullness is suspicious for centrally obstructive neoplasm.  They feel that an infectious or inflammatory etiology is less likely.  They are recommending evaluation with possible bronchoscopy. I have ordered PET scan and we will have the patient in the office as a follow-up once the PET scan has been done We will review results and determine plan of care based on findings. Sherre Lain, and [place], please follow PET scan to see when it is ordered, and then we can schedule the patient within a week of the scan as a nodule follow-up with me in the office. Both patient and his wife verbalized understanding of the above and all answers and concerns were answered. Also please fax results to PCP and let them know plan will be for a PET scan to further evaluate the findings. Thank you so much.

## 2023-07-21 ENCOUNTER — Telehealth: Payer: Self-pay | Admitting: Acute Care

## 2023-07-21 NOTE — Telephone Encounter (Signed)
This pt has only been est with lung cancer screening program  I called him back and advised to call his PCP for management of his cold symptoms  He verbalized understanding and nothing further needed

## 2023-07-21 NOTE — Telephone Encounter (Signed)
Patient states that he is experiencing congestion and cold like symptoms. He is currently taking alka seltzer and is wondering if he should continue or take something else. Please advise.

## 2023-07-21 NOTE — Telephone Encounter (Signed)
Returned call to patient regarding scheduling his PET scan.  States he has developed mild sinus congestion he wants to report in case it is a concern for having the PET scan.  No fever or chest congestion.  States he 'feels fine' just mild sinus congestion.  Advised will proceed with PET scan and if fever or other symptoms occur to please keep [place] informed.  Sent message to [facility] that patient is anxious to schedule the appointment.

## 2023-07-21 NOTE — Telephone Encounter (Signed)
CT results/ plans faxed to PCP. Will save pt to f/u when PET is scheduled to make sure ov follow is scheduled.

## 2023-07-29 ENCOUNTER — Ambulatory Visit: Payer: HMO | Admitting: Acute Care

## 2023-08-02 ENCOUNTER — Ambulatory Visit (HOSPITAL_COMMUNITY)
Admission: RE | Admit: 2023-08-02 | Discharge: 2023-08-02 | Disposition: A | Discharge status: Home / Self Care | Payer: HMO | Source: Ambulatory Visit | Attending: Acute Care | Admitting: Acute Care

## 2023-08-02 DIAGNOSIS — R932 Abnormal findings on diagnostic imaging of liver and biliary tract: Secondary | ICD-10-CM | POA: Diagnosis not present

## 2023-08-02 DIAGNOSIS — R911 Solitary pulmonary nodule: Secondary | ICD-10-CM | POA: Insufficient documentation

## 2023-08-02 LAB — GLUCOSE, CAPILLARY: Glucose-Capillary: 106 mg/dL — ABNORMAL HIGH (ref 70–99)

## 2023-08-02 MED ORDER — FLUDEOXYGLUCOSE F - 18 (FDG) INJECTION
10.5000 | Freq: Once | INTRAVENOUS | Status: AC | PRN
Start: 2023-08-02 — End: 2023-08-02
  Administered 2023-08-02: 10.5 via INTRAVENOUS

## 2023-08-04 ENCOUNTER — Other Ambulatory Visit (HOSPITAL_COMMUNITY): Payer: HMO

## 2023-08-05 ENCOUNTER — Ambulatory Visit (INDEPENDENT_AMBULATORY_CARE_PROVIDER_SITE_OTHER): Payer: HMO | Admitting: Acute Care

## 2023-08-05 ENCOUNTER — Telehealth: Payer: Self-pay | Admitting: Acute Care

## 2023-08-05 ENCOUNTER — Encounter: Payer: Self-pay | Admitting: Pulmonary Disease

## 2023-08-05 ENCOUNTER — Encounter: Payer: Self-pay | Admitting: Acute Care

## 2023-08-05 VITALS — BP 138/78 | HR 70 | Ht 71.0 in | Wt 209.4 lb

## 2023-08-05 DIAGNOSIS — R911 Solitary pulmonary nodule: Secondary | ICD-10-CM

## 2023-08-05 DIAGNOSIS — K769 Liver disease, unspecified: Secondary | ICD-10-CM

## 2023-08-05 DIAGNOSIS — F17200 Nicotine dependence, unspecified, uncomplicated: Secondary | ICD-10-CM

## 2023-08-05 NOTE — H&P (View-Only) (Signed)
History of Present Illness Cameron [name] is a 72 y.o. male current every day smoker an abnormal low dose CT Chest 06/2023. He will be followed by Dr. Tonia Brooms.  72 year old every day smoker followed through the lung cancer screening program with an abnormal screening scan 06/2023. There was a question if this could be an infectious or inflammatory etiology, so repeat CT Chest with contrast was done 07/19/2023 as follow up, This  showed left upper lobe bronchus, suspicious for a centrally obstructive neoplasm Less likely to be infectious or inflammatory. PET scan was ordered to better evaluate. Patient is here today to review PET scan results.   08/09/2023 Pt. Presents for follow up after PET scan. He states he has been doing well .He is anxious about his PET scan results.  We have reviewed the PET scan.  The PET scan shows soft tissue fullness identified around the left upper lobe bronchus on recent diagnostic CT chest is markedly hypermetabolic with SUV max = 14.0. Imaging features are compatible with primary bronchogenic carcinoma. Additionally there is a Single focus of FDG uptake in the medial right hepatic lobe with SUV max = 5.1, which could be metastatic disease, an MR Abdomen with contrast will be ordered to better evaluate for staging. ( 08/12/2023) We discussed the option of biopsy of the lung lesion. Patient is in agreement with this plan. We have reviewed the risks and benefits of the procedure. Bronchoscopy with biopsies has been planned for 08/15/2023 by Dr. Tonia Brooms.   Test Results: 08/02/2023 PET scan The soft tissue fullness identified around the left upper lobe bronchus on recent diagnostic CT chest is markedly hypermetabolic with SUV max = 14.0. Imaging features are compatible with primary bronchogenic carcinoma. 2. No evidence for hypermetabolic mediastinal or right hilar lymphadenopathy. 3. Single focus of FDG uptake in the medial right hepatic lobe with SUV max = 5.1  superimposed on a background of mottled FDG hepatic accumulation. This is a relatively tiny focus of uptake with no underlying abnormality on noncontrast CT imaging. The possibility of metastatic liver lesion cannot be completely excluded. MRI abdomen with and without contrast recommended to further evaluate. 4. Tree-in-bud nodularity anterior left upper lobe is similar to prior, compatible with postobstructive etiology   07/16/2023 CT chest without Persistent soft tissue fullness about the left upper lobe bronchus, suspicious for a centrally obstructive neoplasm. Infectious/inflammatory etiology felt less likely. Significantly improved surrounding left upper lobe aeration, most consistent with decreased postobstructive pneumonitis. Recommend multidisciplinary thoracic oncology consultation for eventual bronchoscopy. No thoracic adenopathy. 3. Aortic atherosclerosis (ICD10-I70.0), coronary artery atherosclerosis and emphysema (ICD10-J43.9). 4. Aortic valvular calcifications. Consider echocardiography to evaluate for valvular dysfunction.    Latest Ref Rng & Units 04/25/2023    8:33 AM 11/25/2022   10:55 AM 10/01/2022    5:00 AM  CBC  WBC 4.0 - 10.5 K/uL 7.5  6.6    Hemoglobin 13.0 - 17.0 g/dL 02.7  25.3  66.4   Hematocrit 39.0 - 52.0 % 48.4  44.0  45.7   Platelets 150.0 - 400.0 K/uL 256.0  235.0         Latest Ref Rng & Units 04/25/2023    8:33 AM 11/25/2022   10:55 AM 09/22/2022    9:56 AM  BMP  Glucose 70 - 99 mg/dL 93  403  474   BUN 6 - 23 mg/dL 9  11  10    Creatinine 0.40 - 1.50 mg/dL 2.59  5.63  8.75   Sodium 135 -  145 mEq/L 136  139  137   Potassium 3.5 - 5.1 mEq/L 4.4  3.8  4.2   Chloride 96 - 112 mEq/L 99  100  103   CO2 19 - 32 mEq/L 30  31  27    Calcium 8.4 - 10.5 mg/dL 9.2  9.3  8.9     BNP No results found for: "BNP"  ProBNP No results found for: "PROBNP"  PFT    Component Value Date/Time   FEV1PRE 2.34 03/28/2018 0848   FEV1POST 2.27 03/28/2018 0848    FVCPRE 3.07 03/28/2018 0848   FVCPOST 2.91 03/28/2018 0848   TLC 6.96 03/28/2018 0848   DLCOUNC 20.92 03/28/2018 0848   PREFEV1FVCRT 76 03/28/2018 0848   PSTFEV1FVCRT 78 03/28/2018 0848    NM PET Image Initial (PI) Skull Base To Thigh (F-18 FDG)  Result Date: 08/03/2023 CLINICAL DATA:  Initial treatment strategy for pulmonary nodule. EXAM: NUCLEAR MEDICINE PET SKULL BASE TO THIGH TECHNIQUE: 10.5 mCi F-18 FDG was injected intravenously. Full-ring PET imaging was performed from the skull base to thigh after the radiotracer. CT data was obtained and used for attenuation correction and anatomic localization. Fasting blood glucose: 106 mg/dl COMPARISON:  Chest CT 16/06/9603 FINDINGS: Mediastinal blood pool activity: SUV max 2.1 Liver activity: SUV max NA NECK: No hypermetabolic lymph nodes in the neck. Incidental CT findings: None. CHEST: The soft tissue fullness identified around the left upper lobe bronchus on recent diagnostic CT chest is markedly hypermetabolic with SUV max = 14.0. No other hypermetabolic pulmonary nodule or mass. No hypermetabolic mediastinal, right hilar, or axillary lymphadenopathy. Incidental CT findings: Coronary artery calcification is evident. Mild atherosclerotic calcification is noted in the wall of the thoracic aorta. Centrilobular emphsyema noted. Tree-in-bud nodularity anterior left upper lobe is similar to prior. ABDOMEN/PELVIS: FDG uptake in the liver parenchyma is somewhat mottled, but there is a single focus of uptake that projects above the background mottled appearance, localizing to the medial right hepatic lobe with SUV max = 5.1. This is a relatively tiny focus of uptake with no underlying abnormality on noncontrast CT imaging. No abnormal hypermetabolic activity within the pancreas, adrenal glands, or spleen. No hypermetabolic lymph nodes in the abdomen or pelvis. Incidental CT findings: Infrarenal abdominal aorta measures 4.3 cm diameter. There is moderate  atherosclerotic calcification of the abdominal aorta without aneurysm. Small cyst noted interpolar left kidney. Prostate gland surgically absent. SKELETON: No focal hypermetabolic activity to suggest skeletal metastasis. Incidental CT findings: None. IMPRESSION: 1. The soft tissue fullness identified around the left upper lobe bronchus on recent diagnostic CT chest is markedly hypermetabolic with SUV max = 14.0. Imaging features are compatible with primary bronchogenic carcinoma. 2. No evidence for hypermetabolic mediastinal or right hilar lymphadenopathy. 3. Single focus of FDG uptake in the medial right hepatic lobe with SUV max = 5.1 superimposed on a background of mottled FDG hepatic accumulation. This is a relatively tiny focus of uptake with no underlying abnormality on noncontrast CT imaging. The possibility of metastatic liver lesion cannot be completely excluded. MRI abdomen with and without contrast recommended to further evaluate. 4. Tree-in-bud nodularity anterior left upper lobe is similar to prior, compatible with postobstructive etiology 5.  Aortic Atherosclerosis (ICD10-I70.0). Electronically Signed   By: Kennith Center M.D.   On: 08/03/2023 07:26   CT CHEST W CONTRAST  Result Date: 07/19/2023 CLINICAL DATA:  Abnormal lung cancer screening CT with left perihilar soft tissue fullness suspicious for primary bronchogenic carcinoma. * Tracking Code: BO * EXAM: CT  CHEST WITH CONTRAST TECHNIQUE: Multidetector CT imaging of the chest was performed during intravenous contrast administration. RADIATION DOSE REDUCTION: This exam was performed according to the departmental dose-optimization program which includes automated exposure control, adjustment of the mA and/or kV according to patient size and/or use of iterative reconstruction technique. CONTRAST:  75mL OMNIPAQUE IOHEXOL 300 MG/ML  SOLN COMPARISON:  06/30/2023 FINDINGS: Cardiovascular: Aortic atherosclerosis. Tortuous thoracic aorta. Normal heart  size, without pericardial effusion. Three vessel coronary artery calcification. Aortic valve calcification. No central pulmonary embolism, on this non-dedicated study. Mediastinum/Nodes: No supraclavicular adenopathy. No mediastinal adenopathy. Lungs/Pleura: No pleural fluid.  Moderate centrilobular emphysema. Persistent soft tissue fullness about the left upper lobe bronchus (estimated at 1.7 cm), including on 79/4 and 78/2. The surrounding left upper lobe demonstrates significantly improved aeration, with near-complete resolution of peribronchovascular ground-glass and nodular consolidation. Residual "tree-in-bud" nodularity is most apparent within the anterior lingula including on 90/4. Upper Abdomen: Cholecystectomy. Normal imaged portions of the liver, spleen, stomach, pancreas, adrenal glands, right kidney. Incompletely imaged interpolar left renal exophytic 1.6 cm lesion is likely a cyst . In the absence of clinically indicated signs/symptoms require(s) no independent follow-up. Musculoskeletal: No acute osseous abnormality. IMPRESSION: 1. Persistent soft tissue fullness about the left upper lobe bronchus, suspicious for a centrally obstructive neoplasm. Infectious/inflammatory etiology felt less likely. Significantly improved surrounding left upper lobe aeration, most consistent with decreased postobstructive pneumonitis. Recommend multidisciplinary thoracic oncology consultation for eventual bronchoscopy. 2. No thoracic adenopathy. 3. Aortic atherosclerosis (ICD10-I70.0), coronary artery atherosclerosis and emphysema (ICD10-J43.9). 4. Aortic valvular calcifications. Consider echocardiography to evaluate for valvular dysfunction. Electronically Signed   By: Jeronimo Greaves M.D.   On: 07/19/2023 17:05     Past medical hx Past Medical History:  Diagnosis Date   Adenomatous colon polyp 05/2009; 11/2014   2010:Tubular adenoma, no high grade dysplasia.Marland Kitchen  + Hyperplastic polyps. 2016: Tubular adenoma-rpt 5 yrs.    Cancer [facility])    TURP for prostate cancer   Coronary artery disease    H/O chronic gastritis 05/2009   EGD: no h.pylori,dysplasia,or evidence of malignancy   Hyperlipidemia    lovaza from prior PMD-stopped due to cost   Hypertension    Obesity, Class I, BMI 30.0-34.9 (see actual BMI) 04/23/2014   Tobacco dependence      Social History   Tobacco Use   Smoking status: Every Day    Current packs/day: 1.00    Average packs/day: 1 pack/day for 40.0 years (40.0 ttl pk-yrs)    Types: Cigarettes   Smokeless tobacco: Never  Vaping Use   Vaping status: Never Used  Substance Use Topics   Alcohol use: Not Currently    Comment: rare   Drug use: No    Mr.Becht reports that he has been smoking cigarettes. He has a 40 pack-year smoking history. He has never used smokeless tobacco. He reports that he does not currently use alcohol. He reports that he does not use drugs.  Tobacco Cessation: Current every day smoker with a 40 pack year smoking history  Past surgical hx, Family hx, Social hx all reviewed.  Current Outpatient Medications on File Prior to Visit  Medication Sig   atorvastatin (LIPITOR) 20 MG tablet Take 1 tablet (20 mg total) by mouth daily.   benazepril (LOTENSIN) 40 MG tablet Take 1 tablet (40 mg total) by mouth daily.   diphenhydrAMINE HCl, Sleep, (SLEEP AID) 25 MG CAPS Take 25 mg by mouth at bedtime.   fluticasone-salmeterol (ADVAIR DISKUS) 250-50 MCG/ACT AEPB Inhale 1 puff into  the lungs in the morning and at bedtime.   hydrochlorothiazide (MICROZIDE) 12.5 MG capsule Take 1 capsule (12.5 mg total) by mouth daily.   sildenafil (VIAGRA) 50 MG tablet Take 0.5-2 tablets (25-100 mg total) by mouth daily as needed for erectile dysfunction.   traZODone (DESYREL) 50 MG tablet Take 0.5-1 tablets (25-50 mg total) by mouth at bedtime as needed for sleep.   valACYclovir (VALTREX) 1000 MG tablet Take 1,000 mg by mouth 2 (two) times daily.   No current facility-administered  medications on file prior to visit.     No Known Allergies  Review Of Systems:  Constitutional:   No  weight loss, night sweats,  Fevers, chills, fatigue, or  lassitude.  HEENT:   No headaches,  Difficulty swallowing,  Tooth/dental problems, or  Sore throat,                No sneezing, itching, ear ache, nasal congestion, post nasal drip,   CV:  No chest pain,  Orthopnea, PND, swelling in lower extremities, anasarca, dizziness, palpitations, syncope.   GI  No heartburn, indigestion, abdominal pain, nausea, vomiting, diarrhea, change in bowel habits, loss of appetite, bloody stools.   Resp: No shortness of breath with exertion or at rest.  No excess mucus, no productive cough,  No non-productive cough,  No coughing up of blood.  No change in color of mucus.  No wheezing.  No chest wall deformity  Skin: no rash or lesions.  GU: no dysuria, change in color of urine, no urgency or frequency.  No flank pain, no hematuria   MS:  No joint pain or swelling.  No decreased range of motion.  No back pain.  Psych:  No change in mood or affect. No depression or anxiety.  No memory loss.   Vital Signs BP 138/78 (BP Location: Left Arm, Cuff Size: Large)   Pulse 70   Ht 5\' 11"  (1.803 m)   Wt 209 lb 6.4 oz (95 kg)   SpO2 97%   BMI 29.21 kg/m    Physical Exam:  General- No distress,  A&Ox3, pleasant ENT: No sinus tenderness, TM clear, pale nasal mucosa, no oral exudate,no post nasal drip, no LAN Cardiac: S1, S2, regular rate and rhythm, no murmur Chest: No wheeze/ rales/ dullness; no accessory muscle use, no nasal flaring, no sternal retractions, slightly diminished per bases Abd.: Soft Non-tender, ND, BS +, Body mass index is 29.21 kg/m.  Ext: No clubbing cyanosis, edema Neuro:  normal strength, MAE x 4, A&O x 3 Skin: No rashes, warm and dry, no lesions  Psych: normal mood and behavior   Assessment/Plan Suspected Lung Cancer Hypermetabolic area in the left upper lobe on PET scan,  raising concern for malignancy. History of smoking. No biopsy yet performed. -Schedule bronchoscopy for biopsy on 08/15/2023. - We discussed the risks of bleeding, infection, lung deflation and anesthesia reaction.  These are rare but we must let you know they are a possibility.  We will send you home with a letter today with additional information -Order pulmonary function tests to assess surgical candidacy. -Consider referral to thoracic surgery pending biopsy results and pulmonary function tests.  Liver Lesion Small area of uptake on PET scan in the liver, unclear significance. -Order MRI of the abdomen to further evaluate. - If concerning for cancer, will refer to medical oncology as well as radiation ocology  Smoking Cessation Patient is a current smoker. Discussed the importance of smoking cessation, especially in the context of potential  lung cancer. -Encourage smoking cessation. -Consider nicotine replacement therapy, hypnosis, and/or acupuncture for smoking cessation. - You can receive free nicotine replacement therapy (patches, gum, or mints) by calling 1-800-QUIT NOW. Please call so we can get you on the path to becoming a non-smoker. I know it is hard, but you can do this!  Hypnosis for smoking cessation  [employer] [phone]  Acupuncture for smoking cessation  [employer] [phone]    Please work on quitting smoking. This is very important  Follow-up Plan to review biopsy and MRI results at next appointment on 08/23/2023 Follow up bronchoscopy  I spent 40 minutes dedicated to the care of this patient on the date of this encounter to include pre-visit review of records, face-to-face time with the patient discussing conditions above, post visit ordering of testing, clinical documentation with the electronic health record, making appropriate referrals as documented, and communicating necessary information to the patient's healthcare team.     Bevelyn Ngo, NP 08/09/2023  3:29 PM

## 2023-08-05 NOTE — Patient Instructions (Addendum)
It is good to see you today. Your PET scan did show the nodule of concern in the left upper lobe is concerning for cancer. We are planning on a biopsy. I have requested the date be 08/15/2023 as we discussed.  We discussed the risks of bleeding, infection, lung deflation and anesthesia reaction.  These are rare but we must let you know they are a possibility.  We will send you home with a letter today with additional information I have also ordered an MRI to evaluate the area on the liver that we saw on the PET scan. You will get a call to get this scheduled. I have also ordered a PFT to check your lung function.  You will get a call to get this scheduled.  You can receive free nicotine replacement therapy (patches, gum, or mints) by calling 1-800-QUIT NOW. Please call so we can get you on the path to becoming a non-smoker. I know it is hard, but you can do this!  Hypnosis for smoking cessation  [employer] [phone]  Acupuncture for smoking cessation  [employer] [phone]    Please work on quitting smoking. This is very important

## 2023-08-05 NOTE — Progress Notes (Unsigned)
History of Present Illness Cameron [name] is a 72 y.o. male with ***   08/05/2023  Test Results: 08/02/2023 PET scan The soft tissue fullness identified around the left upper lobe bronchus on recent diagnostic CT chest is markedly hypermetabolic with SUV max = 14.0. Imaging features are compatible with primary bronchogenic carcinoma. 2. No evidence for hypermetabolic mediastinal or right hilar lymphadenopathy. 3. Single focus of FDG uptake in the medial right hepatic lobe with SUV max = 5.1 superimposed on a background of mottled FDG hepatic accumulation. This is a relatively tiny focus of uptake with no underlying abnormality on noncontrast CT imaging. The possibility of metastatic liver lesion cannot be completely excluded. MRI abdomen with and without contrast recommended to further evaluate. 4. Tree-in-bud nodularity anterior left upper lobe is similar to prior, compatible with postobstructive etiology   07/16/2023 CT chest without Persistent soft tissue fullness about the left upper lobe bronchus, suspicious for a centrally obstructive neoplasm. Infectious/inflammatory etiology felt less likely. Significantly improved surrounding left upper lobe aeration, most consistent with decreased postobstructive pneumonitis. Recommend multidisciplinary thoracic oncology consultation for eventual bronchoscopy. No thoracic adenopathy. 3. Aortic atherosclerosis (ICD10-I70.0), coronary artery atherosclerosis and emphysema (ICD10-J43.9). 4. Aortic valvular calcifications. Consider echocardiography to evaluate for valvular dysfunction.    Latest Ref Rng & Units 04/25/2023    8:33 AM 11/25/2022   10:55 AM 10/01/2022    5:00 AM  CBC  WBC 4.0 - 10.5 K/uL 7.5  6.6    Hemoglobin 13.0 - 17.0 g/dL 16.1  09.6  04.5   Hematocrit 39.0 - 52.0 % 48.4  44.0  45.7   Platelets 150.0 - 400.0 K/uL 256.0  235.0         Latest Ref Rng & Units 04/25/2023    8:33 AM 11/25/2022   10:55 AM 09/22/2022     9:56 AM  BMP  Glucose 70 - 99 mg/dL 93  409  811   BUN 6 - 23 mg/dL 9  11  10    Creatinine 0.40 - 1.50 mg/dL 9.14  7.82  9.56   Sodium 135 - 145 mEq/L 136  139  137   Potassium 3.5 - 5.1 mEq/L 4.4  3.8  4.2   Chloride 96 - 112 mEq/L 99  100  103   CO2 19 - 32 mEq/L 30  31  27    Calcium 8.4 - 10.5 mg/dL 9.2  9.3  8.9     BNP No results found for: "BNP"  ProBNP No results found for: "PROBNP"  PFT    Component Value Date/Time   FEV1PRE 2.34 03/28/2018 0848   FEV1POST 2.27 03/28/2018 0848   FVCPRE 3.07 03/28/2018 0848   FVCPOST 2.91 03/28/2018 0848   TLC 6.96 03/28/2018 0848   DLCOUNC 20.92 03/28/2018 0848   PREFEV1FVCRT 76 03/28/2018 0848   PSTFEV1FVCRT 78 03/28/2018 0848    NM PET Image Initial (PI) Skull Base To Thigh (F-18 FDG)  Result Date: 08/03/2023 CLINICAL DATA:  Initial treatment strategy for pulmonary nodule. EXAM: NUCLEAR MEDICINE PET SKULL BASE TO THIGH TECHNIQUE: 10.5 mCi F-18 FDG was injected intravenously. Full-ring PET imaging was performed from the skull base to thigh after the radiotracer. CT data was obtained and used for attenuation correction and anatomic localization. Fasting blood glucose: 106 mg/dl COMPARISON:  Chest CT 21/30/8657 FINDINGS: Mediastinal blood pool activity: SUV max 2.1 Liver activity: SUV max NA NECK: No hypermetabolic lymph nodes in the neck. Incidental CT findings: None. CHEST: The soft tissue fullness identified around the left  upper lobe bronchus on recent diagnostic CT chest is markedly hypermetabolic with SUV max = 14.0. No other hypermetabolic pulmonary nodule or mass. No hypermetabolic mediastinal, right hilar, or axillary lymphadenopathy. Incidental CT findings: Coronary artery calcification is evident. Mild atherosclerotic calcification is noted in the wall of the thoracic aorta. Centrilobular emphsyema noted. Tree-in-bud nodularity anterior left upper lobe is similar to prior. ABDOMEN/PELVIS: FDG uptake in the liver parenchyma is  somewhat mottled, but there is a single focus of uptake that projects above the background mottled appearance, localizing to the medial right hepatic lobe with SUV max = 5.1. This is a relatively tiny focus of uptake with no underlying abnormality on noncontrast CT imaging. No abnormal hypermetabolic activity within the pancreas, adrenal glands, or spleen. No hypermetabolic lymph nodes in the abdomen or pelvis. Incidental CT findings: Infrarenal abdominal aorta measures 4.3 cm diameter. There is moderate atherosclerotic calcification of the abdominal aorta without aneurysm. Small cyst noted interpolar left kidney. Prostate gland surgically absent. SKELETON: No focal hypermetabolic activity to suggest skeletal metastasis. Incidental CT findings: None. IMPRESSION: 1. The soft tissue fullness identified around the left upper lobe bronchus on recent diagnostic CT chest is markedly hypermetabolic with SUV max = 14.0. Imaging features are compatible with primary bronchogenic carcinoma. 2. No evidence for hypermetabolic mediastinal or right hilar lymphadenopathy. 3. Single focus of FDG uptake in the medial right hepatic lobe with SUV max = 5.1 superimposed on a background of mottled FDG hepatic accumulation. This is a relatively tiny focus of uptake with no underlying abnormality on noncontrast CT imaging. The possibility of metastatic liver lesion cannot be completely excluded. MRI abdomen with and without contrast recommended to further evaluate. 4. Tree-in-bud nodularity anterior left upper lobe is similar to prior, compatible with postobstructive etiology 5.  Aortic Atherosclerosis (ICD10-I70.0). Electronically Signed   By: Kennith Center M.D.   On: 08/03/2023 07:26   CT CHEST W CONTRAST  Result Date: 07/19/2023 CLINICAL DATA:  Abnormal lung cancer screening CT with left perihilar soft tissue fullness suspicious for primary bronchogenic carcinoma. * Tracking Code: BO * EXAM: CT CHEST WITH CONTRAST TECHNIQUE:  Multidetector CT imaging of the chest was performed during intravenous contrast administration. RADIATION DOSE REDUCTION: This exam was performed according to the departmental dose-optimization program which includes automated exposure control, adjustment of the mA and/or kV according to patient size and/or use of iterative reconstruction technique. CONTRAST:  75mL OMNIPAQUE IOHEXOL 300 MG/ML  SOLN COMPARISON:  06/30/2023 FINDINGS: Cardiovascular: Aortic atherosclerosis. Tortuous thoracic aorta. Normal heart size, without pericardial effusion. Three vessel coronary artery calcification. Aortic valve calcification. No central pulmonary embolism, on this non-dedicated study. Mediastinum/Nodes: No supraclavicular adenopathy. No mediastinal adenopathy. Lungs/Pleura: No pleural fluid.  Moderate centrilobular emphysema. Persistent soft tissue fullness about the left upper lobe bronchus (estimated at 1.7 cm), including on 79/4 and 78/2. The surrounding left upper lobe demonstrates significantly improved aeration, with near-complete resolution of peribronchovascular ground-glass and nodular consolidation. Residual "tree-in-bud" nodularity is most apparent within the anterior lingula including on 90/4. Upper Abdomen: Cholecystectomy. Normal imaged portions of the liver, spleen, stomach, pancreas, adrenal glands, right kidney. Incompletely imaged interpolar left renal exophytic 1.6 cm lesion is likely a cyst . In the absence of clinically indicated signs/symptoms require(s) no independent follow-up. Musculoskeletal: No acute osseous abnormality. IMPRESSION: 1. Persistent soft tissue fullness about the left upper lobe bronchus, suspicious for a centrally obstructive neoplasm. Infectious/inflammatory etiology felt less likely. Significantly improved surrounding left upper lobe aeration, most consistent with decreased postobstructive pneumonitis. Recommend multidisciplinary thoracic  oncology consultation for eventual  bronchoscopy. 2. No thoracic adenopathy. 3. Aortic atherosclerosis (ICD10-I70.0), coronary artery atherosclerosis and emphysema (ICD10-J43.9). 4. Aortic valvular calcifications. Consider echocardiography to evaluate for valvular dysfunction. Electronically Signed   By: Jeronimo Greaves M.D.   On: 07/19/2023 17:05     Past medical hx Past Medical History:  Diagnosis Date   Adenomatous colon polyp 05/2009; 11/2014   2010:Tubular adenoma, no high grade dysplasia.Marland Kitchen  + Hyperplastic polyps. 2016: Tubular adenoma-rpt 5 yrs.   Cancer [facility])    TURP for prostate cancer   Coronary artery disease    H/O chronic gastritis 05/2009   EGD: no h.pylori,dysplasia,or evidence of malignancy   Hyperlipidemia    lovaza from prior PMD-stopped due to cost   Hypertension    Obesity, Class I, BMI 30.0-34.9 (see actual BMI) 04/23/2014   Tobacco dependence      Social History   Tobacco Use   Smoking status: Every Day    Current packs/day: 1.00    Average packs/day: 1 pack/day for 40.0 years (40.0 ttl pk-yrs)    Types: Cigarettes   Smokeless tobacco: Never  Vaping Use   Vaping status: Never Used  Substance Use Topics   Alcohol use: Not Currently    Comment: rare   Drug use: No    Mr.Henle reports that he has been smoking cigarettes. He has a 40 pack-year smoking history. He has never used smokeless tobacco. He reports that he does not currently use alcohol. He reports that he does not use drugs.  Tobacco Cessation: Ready to quit: Not Answered Counseling given: Not Answered   Past surgical hx, Family hx, Social hx all reviewed.  Current Outpatient Medications on File Prior to Visit  Medication Sig   atorvastatin (LIPITOR) 20 MG tablet Take 1 tablet (20 mg total) by mouth daily.   benazepril (LOTENSIN) 40 MG tablet Take 1 tablet (40 mg total) by mouth daily.   diphenhydrAMINE HCl, Sleep, (SLEEP AID) 25 MG CAPS Take 25 mg by mouth at bedtime.   fluticasone-salmeterol (ADVAIR DISKUS) 250-50 MCG/ACT AEPB  Inhale 1 puff into the lungs in the morning and at bedtime.   hydrochlorothiazide (MICROZIDE) 12.5 MG capsule Take 1 capsule (12.5 mg total) by mouth daily.   sildenafil (VIAGRA) 50 MG tablet Take 0.5-2 tablets (25-100 mg total) by mouth daily as needed for erectile dysfunction.   traZODone (DESYREL) 50 MG tablet Take 0.5-1 tablets (25-50 mg total) by mouth at bedtime as needed for sleep.   valACYclovir (VALTREX) 1000 MG tablet Take 1,000 mg by mouth 2 (two) times daily.   No current facility-administered medications on file prior to visit.     No Known Allergies  Review Of Systems:  Constitutional:   No  weight loss, night sweats,  Fevers, chills, fatigue, or  lassitude.  HEENT:   No headaches,  Difficulty swallowing,  Tooth/dental problems, or  Sore throat,                No sneezing, itching, ear ache, nasal congestion, post nasal drip,   CV:  No chest pain,  Orthopnea, PND, swelling in lower extremities, anasarca, dizziness, palpitations, syncope.   GI  No heartburn, indigestion, abdominal pain, nausea, vomiting, diarrhea, change in bowel habits, loss of appetite, bloody stools.   Resp: No shortness of breath with exertion or at rest.  No excess mucus, no productive cough,  No non-productive cough,  No coughing up of blood.  No change in color of mucus.  No wheezing.  No  chest wall deformity  Skin: no rash or lesions.  GU: no dysuria, change in color of urine, no urgency or frequency.  No flank pain, no hematuria   MS:  No joint pain or swelling.  No decreased range of motion.  No back pain.  Psych:  No change in mood or affect. No depression or anxiety.  No memory loss.   Vital Signs There were no vitals taken for this visit.   Physical Exam:  General- No distress,  A&Ox3 ENT: No sinus tenderness, TM clear, pale nasal mucosa, no oral exudate,no post nasal drip, no LAN Cardiac: S1, S2, regular rate and rhythm, no murmur Chest: No wheeze/ rales/ dullness; no accessory  muscle use, no nasal flaring, no sternal retractions Abd.: Soft Non-tender Ext: No clubbing cyanosis, edema Neuro:  normal strength Skin: No rashes, warm and dry Psych: normal mood and behavior   Assessment/Plan  No problem-specific Assessment & Plan notes found for this encounter.    Bevelyn Ngo, NP 08/05/2023  8:37 AM

## 2023-08-05 NOTE — Telephone Encounter (Signed)
Ladies, please follow MRI abdomen for results so we can ensure proper staging and referrals. Thanks so much

## 2023-08-09 ENCOUNTER — Encounter: Payer: Self-pay | Admitting: Acute Care

## 2023-08-12 ENCOUNTER — Ambulatory Visit (HOSPITAL_COMMUNITY)
Admission: RE | Admit: 2023-08-12 | Discharge: 2023-08-12 | Disposition: A | Discharge status: Home / Self Care | Payer: HMO | Source: Ambulatory Visit | Attending: Acute Care | Admitting: Acute Care

## 2023-08-12 ENCOUNTER — Encounter (HOSPITAL_COMMUNITY): Payer: Self-pay | Admitting: Pulmonary Disease

## 2023-08-12 ENCOUNTER — Other Ambulatory Visit: Payer: Self-pay

## 2023-08-12 ENCOUNTER — Ambulatory Visit: Payer: HMO | Admitting: Pulmonary Disease

## 2023-08-12 DIAGNOSIS — K769 Liver disease, unspecified: Secondary | ICD-10-CM | POA: Insufficient documentation

## 2023-08-12 DIAGNOSIS — I700 Atherosclerosis of aorta: Secondary | ICD-10-CM | POA: Diagnosis not present

## 2023-08-12 DIAGNOSIS — R911 Solitary pulmonary nodule: Secondary | ICD-10-CM

## 2023-08-12 DIAGNOSIS — F17200 Nicotine dependence, unspecified, uncomplicated: Secondary | ICD-10-CM

## 2023-08-12 DIAGNOSIS — Z9049 Acquired absence of other specified parts of digestive tract: Secondary | ICD-10-CM | POA: Diagnosis not present

## 2023-08-12 DIAGNOSIS — I714 Abdominal aortic aneurysm, without rupture, unspecified: Secondary | ICD-10-CM | POA: Diagnosis not present

## 2023-08-12 DIAGNOSIS — C3490 Malignant neoplasm of unspecified part of unspecified bronchus or lung: Secondary | ICD-10-CM | POA: Diagnosis not present

## 2023-08-12 LAB — PULMONARY FUNCTION TEST
DL/VA % pred: 97 %
DL/VA: 3.93 ml/min/mmHg/L
DLCO unc % pred: 91 %
DLCO unc: 24.44 ml/min/mmHg
FEF 25-75 Post: 2.02 L/s
FEF 25-75 Pre: 1.2 L/s
FEF2575-%Change-Post: 68 %
FEF2575-%Pred-Post: 78 %
FEF2575-%Pred-Pre: 46 %
FEV1-%Change-Post: 14 %
FEV1-%Pred-Post: 75 %
FEV1-%Pred-Pre: 65 %
FEV1-Post: 2.54 L
FEV1-Pre: 2.23 L
FEV1FVC-%Change-Post: 1 %
FEV1FVC-%Pred-Pre: 90 %
FEV6-%Change-Post: 12 %
FEV6-%Pred-Post: 86 %
FEV6-%Pred-Pre: 76 %
FEV6-Post: 3.76 L
FEV6-Pre: 3.33 L
FEV6FVC-%Pred-Post: 105 %
FEV6FVC-%Pred-Pre: 105 %
FVC-%Change-Post: 12 %
FVC-%Pred-Post: 81 %
FVC-%Pred-Pre: 72 %
FVC-Post: 3.76 L
FVC-Pre: 3.33 L
Post FEV1/FVC ratio: 67 %
Post FEV6/FVC ratio: 100 %
Pre FEV1/FVC ratio: 67 %
Pre FEV6/FVC Ratio: 100 %
RV % pred: 119 %
RV: 2.98 L
TLC % pred: 95 %
TLC: 6.91 L

## 2023-08-12 MED ORDER — GADOBUTROL 1 MMOL/ML IV SOLN
9.0000 mL | Freq: Once | INTRAVENOUS | Status: AC | PRN
  Administered 2023-08-12: 9 mL via INTRAVENOUS

## 2023-08-12 NOTE — Progress Notes (Signed)
Full PFT performed today. °

## 2023-08-12 NOTE — Anesthesia Preprocedure Evaluation (Signed)
Anesthesia Evaluation  Patient identified by MRN, date of birth, ID band Patient awake    Reviewed: Allergy & Precautions, H&P , NPO status , Patient's Chart, lab work & pertinent test results  Airway Mallampati: II       Dental  (+) Dental Advisory Given, Teeth Intact,  Lower partial:   Pulmonary COPD, Current Smoker and Patient abstained from smoking. Pulmonary nodule   Pulmonary exam normal        Cardiovascular hypertension, + CAD   Rhythm:Irregular Rate:Normal     Neuro/Psych negative neurological ROS  negative psych ROS   GI/Hepatic negative GI ROS, Neg liver ROS,,,  Endo/Other  negative endocrine ROS    Renal/GU negative Renal ROS  negative genitourinary   Musculoskeletal negative musculoskeletal ROS (+)    Abdominal Normal abdominal exam  (+)   Peds negative pediatric ROS (+)  Hematology Hx of polycythemia   Anesthesia Other Findings   Reproductive/Obstetrics negative OB ROS                             Anesthesia Physical Anesthesia Plan  ASA: 3  Anesthesia Plan: General   Post-op Pain Management:    Induction: Intravenous  PONV Risk Score and Plan: 2 and Ondansetron and Dexamethasone  Airway Management Planned: Oral ETT and Video Laryngoscope Planned  Additional Equipment:   Intra-op Plan:   Post-operative Plan: Extubation in OR  Informed Consent: I have reviewed the patients History and Physical, chart, labs and discussed the procedure including the risks, benefits and alternatives for the proposed anesthesia with the patient or authorized representative who has indicated his/her understanding and acceptance.     Dental advisory given  Plan Discussed with: CRNA  Anesthesia Plan Comments: (PAT note by Antionette Poles, PA-C: 72 year old male current everyday smoker followed a lung cancer screening program with an abnormal scan on 06/2023.  CT chest with contrast  07/19/2023 showed soft tissue fullness about the left upper lobe bronchus, suspicious for a centrally obstructive neoplasm.  COPD, maintained on Advair and as needed albuterol.  Patient was previously evaluated by cardiologist Dr. Tomie China in 2019 due to coronary calcifications seen on chest CT. Stress Cardiolite performed 04/13/2018 showed EF 55%, no ST segment deviation, low risk study.   History of difficult airway, easy mask, but  GlideScope used for intubation 09/30/2022.  Per note, "Difficulty Due To: Difficulty was unanticipated and Difficult Airway- due to immobile epiglottis."  He will need day of surgery labs and evaluation.  EKG 07/02/2022: Normal sinus rhythm.  Rate 70. Nonspecific ST and T wave abnormality  CT chest 07/16/2023: IMPRESSION: 1. Persistent soft tissue fullness about the left upper lobe bronchus, suspicious for a centrally obstructive neoplasm. Infectious/inflammatory etiology felt less likely. Significantly improved surrounding left upper lobe aeration, most consistent with decreased postobstructive pneumonitis. Recommend multidisciplinary thoracic oncology consultation for eventual bronchoscopy. 2. No thoracic adenopathy. 3. Aortic atherosclerosis (ICD10-I70.0), coronary artery atherosclerosis and emphysema (ICD10-J43.9). 4. Aortic valvular calcifications. Consider echocardiography to evaluate for valvular dysfunction.  Myocardial perfusion imaging 04/13/18:  Nuclear stress EF: 55%.  Blood pressure demonstrated a hypertensive response to exercise.  There was no ST segment deviation noted during stress.  The study is normal.  This is a low risk study.  The left ventricular ejection fraction is normal (55-65%).  Normal exercise nuclear stress test with no evidence for prior infarct or ischemia. Poor exercise tolerance. Hypertensive BP response to exertion.     )  Anesthesia Quick Evaluation

## 2023-08-12 NOTE — Patient Instructions (Signed)
Full PFT performed today. °

## 2023-08-12 NOTE — Progress Notes (Signed)
Anesthesia Chart Review: Same day workup  72 year old male current everyday smoker followed a lung cancer screening program with an abnormal scan on 06/2023.  CT chest with contrast 07/19/2023 showed soft tissue fullness about the left upper lobe bronchus, suspicious for a centrally obstructive neoplasm.  COPD, maintained on Advair and as needed albuterol.  Patient was previously evaluated by cardiologist Dr. Tomie China in 2019 due to coronary calcifications seen on chest CT. Stress Cardiolite performed 04/13/2018 showed EF 55%, no ST segment deviation, low risk study.   History of difficult airway, GlideScope used for intubation 09/30/2022.  Per note, "Difficulty Due To: Difficulty was unanticipated and Difficult Airway- due to immobile epiglottis."  He will need day of surgery labs and evaluation.  EKG 07/02/2022: Normal sinus rhythm.  Rate 70. Nonspecific ST and T wave abnormality  CT chest 07/16/2023: IMPRESSION: 1. Persistent soft tissue fullness about the left upper lobe bronchus, suspicious for a centrally obstructive neoplasm. Infectious/inflammatory etiology felt less likely. Significantly improved surrounding left upper lobe aeration, most consistent with decreased postobstructive pneumonitis. Recommend multidisciplinary thoracic oncology consultation for eventual bronchoscopy. 2. No thoracic adenopathy. 3. Aortic atherosclerosis (ICD10-I70.0), coronary artery atherosclerosis and emphysema (ICD10-J43.9). 4. Aortic valvular calcifications. Consider echocardiography to evaluate for valvular dysfunction.  Myocardial perfusion imaging 04/13/18: Nuclear stress EF: 55%. Blood pressure demonstrated a hypertensive response to exercise. There was no ST segment deviation noted during stress. The study is normal. This is a low risk study. The left ventricular ejection fraction is normal (55-65%).   Normal exercise nuclear stress test with no evidence for prior infarct or ischemia. Poor  exercise tolerance. Hypertensive BP response to exertion.      Zannie Cove [facility] Short Stay Center/Anesthesiology Phone [phone] 08/12/2023 10:37 AM

## 2023-08-12 NOTE — Progress Notes (Addendum)
PCP - [name], NP  Cardiologist -   PPM/ICD - denies Device Orders - n/a Rep Notified - n/a  Chest x-ray - 03-28-23 CHEST CT10-1-24 EKG - 07-02-22 Stress Test - 04-13-18 ECHO - denies Cardiac Cath - denies PFT 08-05-23  CPAP - denies HOME SLEEP TEST 08-08-18  DM- denies  Blood Thinner Instructions: denies Aspirin Instructions: no  ERAS Protcol - NPO  COVID TEST- no  Anesthesia review: Yes hx of HTN, CAD current smoker  Patient verbally denies any shortness of breath, fever, cough and chest pain during phone call   -------------  SDW INSTRUCTIONS given:  Your procedure is scheduled on August 15, 2023.  Report to [facility] Main Entrance "A" at 6:30A.M., and check in at the Admitting office.  Call this number if you have problems the morning of surgery:  [phone]   Remember:  Do not eat or drink after midnight the night before your surgery      Take these medicines the morning of surgery with A SIP OF WATER  atorvastatin (LIPITOR)  fluticasone-salmeterol (ADVAIR DISKUS)    IF NEEDED   As of today, STOP taking any Aspirin (unless otherwise instructed by your surgeon) Aleve, Naproxen, Ibuprofen, Motrin, Advil, Goody's, BC's, all herbal medications, fish oil, and all vitamins.                      Do not wear jewelry, make up, or nail polish            Do not wear lotions, powders, perfumes/colognes, or deodorant.            Do not shave 48 hours prior to surgery.  Men may shave face and neck.            Do not bring valuables to the hospital.            [facility] is not responsible for any belongings or valuables.  Do NOT Smoke (Tobacco/Vaping) 24 hours prior to your procedure If you use a CPAP at night, you may bring all equipment for your overnight stay.   Contacts, glasses, dentures or bridgework may not be worn into surgery.      For patients admitted to the hospital, discharge time will be determined by your treatment team.   Patients  discharged the day of surgery will not be allowed to drive home, and someone needs to stay with them for 24 hours.    Special instructions:   [place]- Preparing For Surgery  Before surgery, you can play an important role. Because skin is not sterile, your skin needs to be as free of germs as possible. You can reduce the number of germs on your skin by washing with CHG (chlorahexidine gluconate) Soap before surgery.  CHG is an antiseptic cleaner which kills germs and bonds with the skin to continue killing germs even after washing.    Oral Hygiene is also important to reduce your risk of infection.  Remember - BRUSH YOUR TEETH THE MORNING OF SURGERY WITH YOUR REGULAR TOOTHPASTE  Please do not use if you have an allergy to CHG or antibacterial soaps. If your skin becomes reddened/irritated stop using the CHG.  Do not shave (including legs and underarms) for at least 48 hours prior to first CHG shower. It is OK to shave your face.  Please follow these instructions carefully.   Shower the NIGHT BEFORE SURGERY and the MORNING OF SURGERY with DIAL Soap.   Pat yourself  dry with a CLEAN TOWEL.  Wear CLEAN PAJAMAS to bed the night before surgery  Place CLEAN SHEETS on your bed the night of your first shower and DO NOT SLEEP WITH PETS.   Day of Surgery: Please shower morning of surgery  Wear Clean/Comfortable clothing the morning of surgery Do not apply any deodorants/lotions.   Remember to brush your teeth WITH YOUR REGULAR TOOTHPASTE.   Questions were answered. Patient verbalized understanding of instructions.

## 2023-08-15 ENCOUNTER — Ambulatory Visit (HOSPITAL_COMMUNITY)
Admission: RE | Admit: 2023-08-15 | Discharge: 2023-08-15 | Disposition: A | Discharge status: Home / Self Care | Payer: HMO | Source: Ambulatory Visit | Attending: Pulmonary Disease | Admitting: Pulmonary Disease

## 2023-08-15 ENCOUNTER — Ambulatory Visit (HOSPITAL_BASED_OUTPATIENT_CLINIC_OR_DEPARTMENT_OTHER): Payer: HMO | Admitting: Physician Assistant

## 2023-08-15 ENCOUNTER — Other Ambulatory Visit: Payer: Self-pay

## 2023-08-15 ENCOUNTER — Encounter (HOSPITAL_COMMUNITY)
Admission: RE | Disposition: A | Discharge status: Home / Self Care | Payer: Self-pay | Source: Ambulatory Visit | Attending: Pulmonary Disease

## 2023-08-15 ENCOUNTER — Ambulatory Visit (HOSPITAL_COMMUNITY): Payer: Self-pay | Admitting: Physician Assistant

## 2023-08-15 ENCOUNTER — Encounter (HOSPITAL_COMMUNITY): Payer: Self-pay | Admitting: Pulmonary Disease

## 2023-08-15 ENCOUNTER — Ambulatory Visit (HOSPITAL_COMMUNITY): Payer: HMO

## 2023-08-15 DIAGNOSIS — D491 Neoplasm of unspecified behavior of respiratory system: Secondary | ICD-10-CM | POA: Diagnosis present

## 2023-08-15 DIAGNOSIS — E669 Obesity, unspecified: Secondary | ICD-10-CM | POA: Diagnosis not present

## 2023-08-15 DIAGNOSIS — I2510 Atherosclerotic heart disease of native coronary artery without angina pectoris: Secondary | ICD-10-CM

## 2023-08-15 DIAGNOSIS — Z8546 Personal history of malignant neoplasm of prostate: Secondary | ICD-10-CM | POA: Insufficient documentation

## 2023-08-15 DIAGNOSIS — Z6828 Body mass index (BMI) 28.0-28.9, adult: Secondary | ICD-10-CM | POA: Insufficient documentation

## 2023-08-15 DIAGNOSIS — I10 Essential (primary) hypertension: Secondary | ICD-10-CM | POA: Diagnosis not present

## 2023-08-15 DIAGNOSIS — C3412 Malignant neoplasm of upper lobe, left bronchus or lung: Secondary | ICD-10-CM | POA: Diagnosis not present

## 2023-08-15 DIAGNOSIS — D381 Neoplasm of uncertain behavior of trachea, bronchus and lung: Secondary | ICD-10-CM | POA: Diagnosis not present

## 2023-08-15 DIAGNOSIS — J432 Centrilobular emphysema: Secondary | ICD-10-CM | POA: Insufficient documentation

## 2023-08-15 DIAGNOSIS — F17210 Nicotine dependence, cigarettes, uncomplicated: Secondary | ICD-10-CM

## 2023-08-15 DIAGNOSIS — I700 Atherosclerosis of aorta: Secondary | ICD-10-CM | POA: Insufficient documentation

## 2023-08-15 DIAGNOSIS — Z7951 Long term (current) use of inhaled steroids: Secondary | ICD-10-CM | POA: Diagnosis not present

## 2023-08-15 DIAGNOSIS — R911 Solitary pulmonary nodule: Secondary | ICD-10-CM | POA: Diagnosis present

## 2023-08-15 HISTORY — DX: Chronic obstructive pulmonary disease, unspecified: J44.9

## 2023-08-15 HISTORY — PX: VIDEO BRONCHOSCOPY: SHX5072

## 2023-08-15 HISTORY — PX: BIOPSY: SHX5522

## 2023-08-15 HISTORY — PX: HEMOSTASIS CONTROL: SHX6838

## 2023-08-15 LAB — POCT I-STAT, CHEM 8
BUN: 10 mg/dL (ref 8–23)
Calcium, Ion: 1.14 mmol/L — ABNORMAL LOW (ref 1.15–1.40)
Chloride: 102 mmol/L (ref 98–111)
Creatinine, Ser: 0.8 mg/dL (ref 0.61–1.24)
Glucose, Bld: 102 mg/dL — ABNORMAL HIGH (ref 70–99)
HCT: 48 % (ref 39.0–52.0)
Hemoglobin: 16.3 g/dL (ref 13.0–17.0)
Potassium: 4.1 mmol/L (ref 3.5–5.1)
Sodium: 137 mmol/L (ref 135–145)
TCO2: 24 mmol/L (ref 22–32)

## 2023-08-15 SURGERY — VIDEO BRONCHOSCOPY WITHOUT FLUORO
Anesthesia: General

## 2023-08-15 MED ORDER — ONDANSETRON HCL 4 MG/2ML IJ SOLN
INTRAMUSCULAR | Status: DC | PRN
  Administered 2023-08-15: 4 mg via INTRAVENOUS

## 2023-08-15 MED ORDER — DEXAMETHASONE SODIUM PHOSPHATE 10 MG/ML IJ SOLN
INTRAMUSCULAR | Status: DC | PRN
  Administered 2023-08-15: 8 mg via INTRAVENOUS

## 2023-08-15 MED ORDER — CHLORHEXIDINE GLUCONATE 0.12 % MT SOLN
OROMUCOSAL | Status: AC
  Administered 2023-08-15: 15 mL
  Filled 2023-08-15: qty 15

## 2023-08-15 MED ORDER — LACTATED RINGERS IV SOLN: INTRAVENOUS | Status: DC

## 2023-08-15 MED ORDER — PROPOFOL 10 MG/ML IV BOLUS
INTRAVENOUS | Status: DC | PRN
  Administered 2023-08-15: 150 mg via INTRAVENOUS

## 2023-08-15 MED ORDER — ROCURONIUM BROMIDE 10 MG/ML (PF) SYRINGE
PREFILLED_SYRINGE | INTRAVENOUS | Status: DC | PRN
  Administered 2023-08-15: 50 mg via INTRAVENOUS

## 2023-08-15 MED ORDER — FENTANYL CITRATE (PF) 100 MCG/2ML IJ SOLN: 25.0000 ug | INTRAMUSCULAR | Status: DC | PRN

## 2023-08-15 MED ORDER — LIDOCAINE 2% (20 MG/ML) 5 ML SYRINGE
INTRAMUSCULAR | Status: DC | PRN
  Administered 2023-08-15: 100 mg via INTRAVENOUS

## 2023-08-15 MED ORDER — OXYCODONE HCL 5 MG/5ML PO SOLN: 5.0000 mg | Freq: Once | ORAL | Status: DC | PRN

## 2023-08-15 MED ORDER — EPINEPHRINE 1 MG/10ML IJ SOSY
PREFILLED_SYRINGE | INTRAMUSCULAR | Status: AC
Start: 2023-08-15 — End: ?
  Filled 2023-08-15: qty 10

## 2023-08-15 MED ORDER — FENTANYL CITRATE (PF) 250 MCG/5ML IJ SOLN
INTRAMUSCULAR | Status: DC | PRN
  Administered 2023-08-15: 100 ug via INTRAVENOUS

## 2023-08-15 MED ORDER — DROPERIDOL 2.5 MG/ML IJ SOLN: 0.6250 mg | Freq: Once | INTRAMUSCULAR | Status: DC | PRN

## 2023-08-15 MED ORDER — SUGAMMADEX SODIUM 200 MG/2ML IV SOLN
INTRAVENOUS | Status: DC | PRN
  Administered 2023-08-15: 400 mg via INTRAVENOUS

## 2023-08-15 MED ORDER — ACETAMINOPHEN 10 MG/ML IV SOLN: 1000.0000 mg | Freq: Once | INTRAVENOUS | Status: DC | PRN

## 2023-08-15 MED ORDER — OXYCODONE HCL 5 MG PO TABS: 5.0000 mg | ORAL_TABLET | Freq: Once | ORAL | Status: DC | PRN

## 2023-08-15 MED ORDER — SODIUM CHLORIDE (PF) 0.9 % IJ SOLN
PREFILLED_SYRINGE | INTRAMUSCULAR | Status: DC | PRN
  Administered 2023-08-15: 2 mL

## 2023-08-15 NOTE — Interval H&P Note (Signed)
History and Physical Interval Note:  08/15/2023 7:29 AM  Cameron [name]  has presented today for surgery, with the diagnosis of ABNORMAL LUNG NODULE.  The various methods of treatment have been discussed with the patient and family. After consideration of risks, benefits and other options for treatment, the patient has consented to  Procedure(s): ROBOTIC ASSISTED NAVIGATIONAL BRONCHOSCOPY (N/A) as a surgical intervention.  The patient's history has been reviewed, patient examined, no change in status, stable for surgery.  I have reviewed the patient's chart and labs.  Questions were answered to the patient's satisfaction.     Rachel Bo [place]

## 2023-08-15 NOTE — Anesthesia Procedure Notes (Signed)
Procedure Name: Intubation Date/Time: 08/15/2023 9:08 AM  Performed by: Loleta Luzmaria Devaux, CRNAPre-anesthesia Checklist: Patient identified, Patient being monitored, Timeout performed, Emergency Drugs available and Suction available Patient Re-evaluated:Patient Re-evaluated prior to induction Oxygen Delivery Method: Circle system utilized Preoxygenation: Pre-oxygenation with 100% oxygen Induction Type: IV induction Ventilation: Mask ventilation without difficulty Laryngoscope Size: Glidescope and 4 Grade View: Grade I Tube type: Oral Tube size: 8.5 mm Number of attempts: 1 Airway Equipment and Method: Stylet Placement Confirmation: ETT inserted through vocal cords under direct vision, positive ETCO2 and breath sounds checked- equal and bilateral Secured at: 24 cm Tube secured with: Tape Dental Injury: Teeth and Oropharynx as per pre-operative assessment  Difficulty Due To: Difficult Airway- due to immobile epiglottis and Difficult Airway- due to anterior larynx

## 2023-08-15 NOTE — Transfer of Care (Signed)
Immediate Anesthesia Transfer of Care Note  Patient: Cameron [name]  Procedure(s) Performed: VIDEO BRONCHOSCOPY WITHOUT FLUORO BIOPSY HEMOSTASIS CONTROL  Patient Location: PACU  Anesthesia Type:General  Level of Consciousness: awake  Airway & Oxygen Therapy: Patient Spontanous Breathing  Post-op Assessment: Report given to RN and Post -op Vital signs reviewed and stable  Post vital signs: Reviewed and stable  Last Vitals:  Vitals Value Taken Time  BP 158/94 08/15/23 0945  Temp 36.6 C 08/15/23 0945  Pulse 76 08/15/23 0952  Resp 13 08/15/23 0952  SpO2 95 % 08/15/23 0952  Vitals shown include unfiled device data.  Last Pain:  Vitals:   08/15/23 0945  TempSrc:   PainSc: 0-No pain         Complications: No notable events documented.

## 2023-08-15 NOTE — Op Note (Signed)
Video Bronchoscopy Procedure Note  Date of Operation: 08/15/2023  Pre-op Diagnosis: Endobronchial tumor   Post-op Diagnosis: Endobronchial tumor  Surgeon: Josephine Igo, DO  Assistants: none  Anesthesia: general  Operation: Flexible video fiberoptic bronchoscopy and biopsies.  Estimated Blood Loss: <5 cc  Complications: none noted  Indications and History: Cameron [name] is 72 y.o. with history of left upper lobe endobronchial tumor.  Recommendation was to perform video fiberoptic bronchoscopy with biopsies. The risks, benefits, complications, treatment options and expected outcomes were discussed with the patient.  The possibilities of pneumothorax, pneumonia, reaction to medication, pulmonary aspiration, perforation of a viscus, bleeding, failure to diagnose a condition and creating a complication requiring transfusion or operation were discussed with the patient who freely signed the consent.    Description of Procedure: The patient was seen in the Preoperative Area, was examined and was deemed appropriate to proceed.  The patient was taken to [facility] endoscopy room 3, identified as [name] and the procedure verified as Flexible Video Fiberoptic Bronchoscopy.  A Time Out was held and the above information confirmed.   Using a standard bronchoscope we were able to examine the bilateral airways.  The right lung appears normal.  The left lung lower lobe appears normal however the left upper lobe apical segment there is visible tumor coming from the airway.  Using 1-10,000 dilution of epinephrine I was able to saturate the tumor bed prior to biopsy.  Using standard 2 mm [employer] forceps we were able to obtain specimens to send for cytology evaluation.  There was a visible tumor eroding into the proximal wall of the left hilum.  Samples: 1.  Endobronchial forcep biopsies left upper lobe  Plans:  We will review the cytology, pathology results with the patient when they  become available.  Outpatient followup will be with Dr Josephine Igo, DO.   Josephine Igo, DO [facility] 08/15/2023 10:00 AM

## 2023-08-15 NOTE — Anesthesia Postprocedure Evaluation (Signed)
Anesthesia Post Note  Patient: Cameron [name]  Procedure(s) Performed: VIDEO BRONCHOSCOPY WITHOUT FLUORO BIOPSY HEMOSTASIS CONTROL     Patient location during evaluation: PACU Anesthesia Type: General Level of consciousness: awake and alert Pain management: pain level controlled Vital Signs Assessment: post-procedure vital signs reviewed and stable Respiratory status: spontaneous breathing, nonlabored ventilation, respiratory function stable and patient connected to nasal cannula oxygen Cardiovascular status: blood pressure returned to baseline and stable Postop Assessment: no apparent nausea or vomiting Anesthetic complications: no   No notable events documented.  Last Vitals:  Vitals:   08/15/23 1000 08/15/23 1015  BP: 132/79 130/76  Pulse: 74 78  Resp: 13 19  Temp:  36.6 C  SpO2: 94% 94%    Last Pain:  Vitals:   08/15/23 1015  TempSrc:   PainSc: 0-No pain                 Tustin Nation

## 2023-08-15 NOTE — Discharge Instructions (Signed)
Flexible Bronchoscopy, Care After This sheet gives you information about how to care for yourself after your test. Your doctor may also give you more specific instructions. If you have problems or questions, contact your doctor. Follow these instructions at home: Eating and drinking Do not eat or drink anything (not even water) for 2 hours after your test, or until your numbing medicine (local anesthetic) wears off. When your numbness is gone and your cough and gag reflexes have come back, you may: Eat only soft foods. Slowly drink liquids. The day after the test, go back to your normal diet. Driving Do not drive for 24 hours if you were given a medicine to help you relax (sedative). Do not drive or use heavy machinery while taking prescription pain medicine. General instructions  Take over-the-counter and prescription medicines only as told by your doctor. Return to your normal activities as told. Ask what activities are safe for you. Do not use any products that have nicotine or tobacco in them. This includes cigarettes and e-cigarettes. If you need help quitting, ask your doctor. Keep all follow-up visits as told by your doctor. This is important. It is very important if you had a tissue sample (biopsy) taken. Get help right away if: You have shortness of breath that gets worse. You get light-headed. You feel like you are going to pass out (faint). You have chest pain. You cough up: More than a little blood. More blood than before. Summary Do not eat or drink anything (not even water) for 2 hours after your test, or until your numbing medicine wears off. Do not use cigarettes. Do not use e-cigarettes. Get help right away if you have chest pain.  This information is not intended to replace advice given to you by your health care provider. Make sure you discuss any questions you have with your health care provider. Document Released: 07/11/2009 Document Revised: 08/26/2017 Document  Reviewed: 10/01/2016 Elsevier Patient Education  2020 [employer] Inc[employer]  

## 2023-08-17 ENCOUNTER — Encounter (HOSPITAL_COMMUNITY): Payer: Self-pay | Admitting: Pulmonary Disease

## 2023-08-17 NOTE — Telephone Encounter (Signed)
MRI completed 08/12/2023, results still pending. Reminder set to follow up on results.

## 2023-08-19 LAB — CYTOLOGY - NON PAP

## 2023-08-22 DIAGNOSIS — L570 Actinic keratosis: Secondary | ICD-10-CM | POA: Diagnosis not present

## 2023-08-22 DIAGNOSIS — Z85828 Personal history of other malignant neoplasm of skin: Secondary | ICD-10-CM | POA: Diagnosis not present

## 2023-08-22 DIAGNOSIS — L820 Inflamed seborrheic keratosis: Secondary | ICD-10-CM | POA: Diagnosis not present

## 2023-08-22 DIAGNOSIS — L723 Sebaceous cyst: Secondary | ICD-10-CM | POA: Diagnosis not present

## 2023-08-23 ENCOUNTER — Telehealth: Payer: Self-pay | Admitting: Radiation Oncology

## 2023-08-23 ENCOUNTER — Ambulatory Visit: Payer: HMO | Admitting: Acute Care

## 2023-08-23 ENCOUNTER — Encounter: Payer: Self-pay | Admitting: Acute Care

## 2023-08-23 VITALS — BP 120/70 | HR 68 | Ht 72.0 in | Wt 211.4 lb

## 2023-08-23 DIAGNOSIS — F17210 Nicotine dependence, cigarettes, uncomplicated: Secondary | ICD-10-CM

## 2023-08-23 DIAGNOSIS — Z9889 Other specified postprocedural states: Secondary | ICD-10-CM

## 2023-08-23 DIAGNOSIS — C3412 Malignant neoplasm of upper lobe, left bronchus or lung: Secondary | ICD-10-CM | POA: Diagnosis not present

## 2023-08-23 DIAGNOSIS — C3492 Malignant neoplasm of unspecified part of left bronchus or lung: Secondary | ICD-10-CM

## 2023-08-23 NOTE — Progress Notes (Signed)
History of Present Illness Cameron [name] is a 72 y.o. male current every day smoker an abnormal low dose CT Chest 06/2023. Followed by Dr. Tonia Brooms.   Synopsis 72 year old every day smoker followed through the lung cancer screening program with an abnormal screening scan 06/2023. There was a question if this could be an infectious or inflammatory etiology, so repeat CT Chest with contrast was done 07/19/2023 as follow up, This  showed left upper lobe bronchus, suspicious for a centrally obstructive neoplasm Less likely to be infectious or inflammatory. PET scan was ordered to better evaluate the area of concern. PET scan was positive for soft tissue fullness identified around the left upper lobe bronchus was  markedly hypermetabolic and  compatible with primary bronchogenic carcinoma. Plan was for a bronchoscopy with biopsy for definitive tissue Diagnosis. He underwent Flexible video fiberoptic bronchoscopy and biopsies on 08/15/2023. He is here today to discuss the results of his cytology and to ensure he has done well after the procedure.   08/23/2023 Pt. Presents for post bronchoscopy follow up. He states he did well after the procedure.  He denies any hemoptysis, fever, discolored secretions, shortness of breath, or adverse anesthesia reaction. He is here today with his wife and his daughter.  We discussed the results of his cytology.  The left upper lobe biopsy was positive for squamous cell carcinoma.  He has had an MRI of the abdomen and the finding on PET scan was actually not evident on the MRI of the abdomen therefore we feel this is a stage Ia nodule.  Plan will be to refer patient to surgery and to radiation oncology to determine best option moving forward.  PFTs have been done and indicate patient would be a good candidate for surgery, however he is a bit anxious about this option.  I have asked him to go and speak with both radiation oncology and surgery to determine what the best option will  be for him.  Both his wife and daughter are in agreement with the above plan.  Patient states he cut cigarettes down yesterday and is working very hard to quit totally.  He states with the diagnosis of lung cancer he feels he can do this.  Test Results: Cytology 08/15/2023 A. LUNG, LUL, BIOPSY:  - Squamous cell carcinoma, see comment  COMMENT:  Immunohistochemical stains show the tumor cells are positive for p63 and  CK5/6 while they are negative for TTF-1 synaptophysin, CD56, NKX3.1,  CK7, CK20 and MOC-31.  This immunoprofile is consistent with above  interpretation.  Dr. Kenyon Ana reviewed the case and concurs with the  above diagnosis.  Dr. Tonia Brooms was notified on 08/19/2023.   08/12/2023 MR Abdomen No evidence of metastatic disease in the abdomen. Specifically, no focal liver abnormality or suspicious contrast enhancement to correspond to prior PET-CT finding in the right lobe of the liver. 2. Aneurysm of the infrarenal abdominal aorta measuring up to 4.1 cm. Recommend follow-up CT or MR as appropriate in 12 months and referral to or continued care with vascular specialist if clinically appropriate. (Ref.: J Vasc Surg. 2018; 67:2-77 and J Am Coll Radiol 2013;10(10):789-794.) 3. Distended urinary bladder, partially imaged in the lower abdomen. Correlate for urinary retention. 4. Status post cholecystectomy.  08/02/2023 PET scan The soft tissue fullness identified around the left upper lobe bronchus on recent diagnostic CT chest is markedly hypermetabolic with SUV max = 14.0. Imaging features are compatible with primary bronchogenic carcinoma. 2. No evidence for hypermetabolic mediastinal  or right hilar lymphadenopathy. 3. Single focus of FDG uptake in the medial right hepatic lobe with SUV max = 5.1 superimposed on a background of mottled FDG hepatic accumulation. This is a relatively tiny focus of uptake with no underlying abnormality on noncontrast CT imaging. The possibility  of metastatic liver lesion cannot be completely excluded. MRI abdomen with and without contrast recommended to further evaluate. 4. Tree-in-bud nodularity anterior left upper lobe is similar to prior, compatible with postobstructive etiology     07/16/2023 CT chest without Persistent soft tissue fullness about the left upper lobe bronchus, suspicious for a centrally obstructive neoplasm. Infectious/inflammatory etiology felt less likely. Significantly improved surrounding left upper lobe aeration, most consistent with decreased postobstructive pneumonitis. Recommend multidisciplinary thoracic oncology consultation for eventual bronchoscopy. No thoracic adenopathy. 3. Aortic atherosclerosis (ICD10-I70.0), coronary artery atherosclerosis and emphysema (ICD10-J43.9). 4. Aortic valvular calcifications. Consider echocardiography to evaluate for valvular dysfunction.   PFT 08/12/2023               Latest Ref Rng & Units 08/15/2023    8:56 AM 04/25/2023    8:33 AM 11/25/2022   10:55 AM  CBC  WBC 4.0 - 10.5 K/uL  7.5  6.6   Hemoglobin 13.0 - 17.0 g/dL 13.2  44.0  10.2   Hematocrit 39.0 - 52.0 % 48.0  48.4  44.0   Platelets 150.0 - 400.0 K/uL  256.0  235.0        Latest Ref Rng & Units 08/15/2023    8:56 AM 04/25/2023    8:33 AM 11/25/2022   10:55 AM  BMP  Glucose 70 - 99 mg/dL 725  93  366   BUN 8 - 23 mg/dL 10  9  11    Creatinine 0.61 - 1.24 mg/dL 4.40  3.47  4.25   Sodium 135 - 145 mmol/L 137  136  139   Potassium 3.5 - 5.1 mmol/L 4.1  4.4  3.8   Chloride 98 - 111 mmol/L 102  99  100   CO2 19 - 32 mEq/L  30  31   Calcium 8.4 - 10.5 mg/dL  9.2  9.3     BNP No results found for: "BNP"  ProBNP No results found for: "PROBNP"  PFT    Component Value Date/Time   FEV1PRE 2.23 08/12/2023 1250   FEV1POST 2.54 08/12/2023 1250   FVCPRE 3.33 08/12/2023 1250   FVCPOST 3.76 08/12/2023 1250   TLC 6.91 08/12/2023 1250   DLCOUNC 24.44 08/12/2023 1250   PREFEV1FVCRT 67  08/12/2023 1250   PSTFEV1FVCRT 67 08/12/2023 1250    MR ABDOMEN WWO CONTRAST  Result Date: 08/19/2023 CLINICAL DATA:  Lung cancer suspected FDG avid liver metastasis on prior PET-CT EXAM: MRI ABDOMEN WITHOUT AND WITH CONTRAST TECHNIQUE: Multiplanar multisequence MR imaging of the abdomen was performed both before and after the administration of intravenous contrast. CONTRAST:  9mL GADAVIST GADOBUTROL 1 MMOL/ML IV SOLN COMPARISON:  PET-CT, 08/02/2023 FINDINGS: Lower chest: No acute abnormality. Hepatobiliary: No focal liver abnormality is seen. Status post cholecystectomy. No biliary dilatation. Pancreas: Unremarkable. No pancreatic ductal dilatation or surrounding inflammatory changes. Spleen: Normal in size without significant abnormality. Adrenals/Urinary Tract: Adrenal glands are unremarkable. Kidneys are normal, without renal calculi, solid lesion, or hydronephrosis. Distended urinary bladder, partially imaged in the lower abdomen. Stomach/Bowel: Stomach is within normal limits. No evidence of bowel wall thickening, distention, or inflammatory changes. Vascular/Lymphatic: Severe aortic atherosclerosis. Aneurysm of the infrarenal abdominal aorta measuring up to 4.1 x 4.0 cm (series 12,  image 59). No enlarged abdominal lymph nodes. Other: No abdominal wall hernia or abnormality. No ascites. Musculoskeletal: No acute or significant osseous findings. IMPRESSION: 1. No evidence of metastatic disease in the abdomen. Specifically, no focal liver abnormality or suspicious contrast enhancement to correspond to prior PET-CT finding in the right lobe of the liver. 2. Aneurysm of the infrarenal abdominal aorta measuring up to 4.1 cm. Recommend follow-up CT or MR as appropriate in 12 months and referral to or continued care with vascular specialist if clinically appropriate. (Ref.: J Vasc Surg. 2018; 67:2-77 and J Am Coll Radiol 2013;10(10):789-794.) 3. Distended urinary bladder, partially imaged in the lower abdomen.  Correlate for urinary retention. 4. Status post cholecystectomy. Aortic Atherosclerosis (ICD10-I70.0). Electronically Signed   By: Jearld Lesch M.D.   On: 08/19/2023 11:18   NM PET Image Initial (PI) Skull Base To Thigh (F-18 FDG)  Result Date: 08/03/2023 CLINICAL DATA:  Initial treatment strategy for pulmonary nodule. EXAM: NUCLEAR MEDICINE PET SKULL BASE TO THIGH TECHNIQUE: 10.5 mCi F-18 FDG was injected intravenously. Full-ring PET imaging was performed from the skull base to thigh after the radiotracer. CT data was obtained and used for attenuation correction and anatomic localization. Fasting blood glucose: 106 mg/dl COMPARISON:  Chest CT 16/06/9603 FINDINGS: Mediastinal blood pool activity: SUV max 2.1 Liver activity: SUV max NA NECK: No hypermetabolic lymph nodes in the neck. Incidental CT findings: None. CHEST: The soft tissue fullness identified around the left upper lobe bronchus on recent diagnostic CT chest is markedly hypermetabolic with SUV max = 14.0. No other hypermetabolic pulmonary nodule or mass. No hypermetabolic mediastinal, right hilar, or axillary lymphadenopathy. Incidental CT findings: Coronary artery calcification is evident. Mild atherosclerotic calcification is noted in the wall of the thoracic aorta. Centrilobular emphsyema noted. Tree-in-bud nodularity anterior left upper lobe is similar to prior. ABDOMEN/PELVIS: FDG uptake in the liver parenchyma is somewhat mottled, but there is a single focus of uptake that projects above the background mottled appearance, localizing to the medial right hepatic lobe with SUV max = 5.1. This is a relatively tiny focus of uptake with no underlying abnormality on noncontrast CT imaging. No abnormal hypermetabolic activity within the pancreas, adrenal glands, or spleen. No hypermetabolic lymph nodes in the abdomen or pelvis. Incidental CT findings: Infrarenal abdominal aorta measures 4.3 cm diameter. There is moderate atherosclerotic calcification  of the abdominal aorta without aneurysm. Small cyst noted interpolar left kidney. Prostate gland surgically absent. SKELETON: No focal hypermetabolic activity to suggest skeletal metastasis. Incidental CT findings: None. IMPRESSION: 1. The soft tissue fullness identified around the left upper lobe bronchus on recent diagnostic CT chest is markedly hypermetabolic with SUV max = 14.0. Imaging features are compatible with primary bronchogenic carcinoma. 2. No evidence for hypermetabolic mediastinal or right hilar lymphadenopathy. 3. Single focus of FDG uptake in the medial right hepatic lobe with SUV max = 5.1 superimposed on a background of mottled FDG hepatic accumulation. This is a relatively tiny focus of uptake with no underlying abnormality on noncontrast CT imaging. The possibility of metastatic liver lesion cannot be completely excluded. MRI abdomen with and without contrast recommended to further evaluate. 4. Tree-in-bud nodularity anterior left upper lobe is similar to prior, compatible with postobstructive etiology 5.  Aortic Atherosclerosis (ICD10-I70.0). Electronically Signed   By: Kennith Center M.D.   On: 08/03/2023 07:26     Past medical hx Past Medical History:  Diagnosis Date   Adenomatous colon polyp 05/2009; 11/2014   2010:Tubular adenoma, no high grade dysplasia.Marland Kitchen  +  Hyperplastic polyps. 2016: Tubular adenoma-rpt 5 yrs.   Cancer (HCC)    TURP for prostate cancer   COPD (chronic obstructive pulmonary disease) (HCC)    Coronary artery disease    H/O chronic gastritis 05/2009   EGD: no h.pylori,dysplasia,or evidence of malignancy   Hyperlipidemia    lovaza from prior PMD-stopped due to cost   Hypertension    Obesity, Class I, BMI 30.0-34.9 (see actual BMI) 04/23/2014   Tobacco dependence      Social History   Tobacco Use   Smoking status: Every Day    Current packs/day: 1.00    Average packs/day: 1 pack/day for 40.0 years (40.0 ttl pk-yrs)    Types: Cigarettes   Smokeless  tobacco: Never  Vaping Use   Vaping status: Never Used  Substance Use Topics   Alcohol use: Not Currently    Comment: rare   Drug use: No    Mr.Asmus reports that he has been smoking cigarettes. He has a 40 pack-year smoking history. He has never used smokeless tobacco. He reports that he does not currently use alcohol. He reports that he does not use drugs.  Tobacco Cessation: Current everyday smoker quit yesterday  Past surgical hx, Family hx, Social hx all reviewed.  Current Outpatient Medications on File Prior to Visit  Medication Sig   atorvastatin (LIPITOR) 20 MG tablet Take 1 tablet (20 mg total) by mouth daily.   benazepril (LOTENSIN) 40 MG tablet Take 1 tablet (40 mg total) by mouth daily.   diphenhydrAMINE HCl, Sleep, (SLEEP AID) 25 MG CAPS Take 25 mg by mouth at bedtime.   fluticasone-salmeterol (ADVAIR DISKUS) 250-50 MCG/ACT AEPB Inhale 1 puff into the lungs in the morning and at bedtime.   hydrochlorothiazide (MICROZIDE) 12.5 MG capsule Take 1 capsule (12.5 mg total) by mouth daily.   sildenafil (VIAGRA) 50 MG tablet Take 0.5-2 tablets (25-100 mg total) by mouth daily as needed for erectile dysfunction.   traZODone (DESYREL) 50 MG tablet Take 0.5-1 tablets (25-50 mg total) by mouth at bedtime as needed for sleep.   valACYclovir (VALTREX) 1000 MG tablet Take 1,000 mg by mouth 2 (two) times daily.   No current facility-administered medications on file prior to visit.     No Known Allergies  Review Of Systems:  Constitutional:   No  weight loss, night sweats,  Fevers, chills, fatigue, or  lassitude.  HEENT:   No headaches,  Difficulty swallowing,  Tooth/dental problems, or  Sore throat,                No sneezing, itching, ear ache, nasal congestion, post nasal drip,   CV:  No chest pain,  Orthopnea, PND, swelling in lower extremities, anasarca, dizziness, palpitations, syncope.   GI  No heartburn, indigestion, abdominal pain, nausea, vomiting, diarrhea, change in  bowel habits, loss of appetite, bloody stools.   Resp: No shortness of breath with exertion or at rest.  No excess mucus, no productive cough,  No non-productive cough,  No coughing up of blood.  No change in color of mucus.  No wheezing.  No chest wall deformity  Skin: no rash or lesions.  GU: no dysuria, change in color of urine, no urgency or frequency.  No flank pain, no hematuria   MS:  No joint pain or swelling.  No decreased range of motion.  No back pain.  Psych:  No change in mood or affect. No depression or anxiety.  No memory loss.   Vital Signs BP 120/70 (  BP Location: Left Arm, Cuff Size: Large)   Pulse 68   Ht 6' (1.829 m)   Wt 211 lb 6.4 oz (95.9 kg)   SpO2 97%   BMI 28.67 kg/m    Physical Exam:  General- No distress,  A&Ox3, pleasant ENT: No sinus tenderness, TM clear, pale nasal mucosa, no oral exudate,no post nasal drip, no LAN Cardiac: S1, S2, regular rate and rhythm, no murmur Chest: No wheeze/ rales/ dullness; no accessory muscle use, no nasal flaring, no sternal retractions Abd.: Soft Non-tender, nondistended, bowel sounds positive,Body mass index is 28.67 kg/m.  Ext: No clubbing cyanosis, edema Neuro:  normal strength, moving all extremities x 4, alert and oriented x 3, appropriate Skin: No rashes, warm and dry, no lesions Psych: normal mood and behavior   Assessment/Plan Squamous cell lung cancer of the left upper lobe Post bronchoscopy with biopsies Smoker who quit 08/22/2023 with intent for long-term smoking cessation Plan Your biopsy was positive for squamous cell lung cancer of the left upper lobe. I have referred you to thoracic surgery and radiation oncology. You will get calls to get these scheduled.  Congratulations on quitting smoking. Keep up the good work. If you need additional help consider: You can receive free nicotine replacement therapy (patches, gum, or mints) by calling 1-800-QUIT NOW. Please call so we can get you on the path  to becoming a non-smoker. I know it is hard, but you can do this!  Hypnosis for smoking cessation  [employer] [phone]  Acupuncture for smoking cessation  [employer] [phone]  Please call if you need [place] sooner. Please contact office for sooner follow up if symptoms do not improve or worsen or seek emergency care    I spent 30 minutes dedicated to the care of this patient on the date of this encounter to include pre-visit review of records, face-to-face time with the patient discussing conditions above, post visit ordering of testing, clinical documentation with the electronic health record, making appropriate referrals as documented, and communicating necessary information to the patient's healthcare team.   Bevelyn Ngo, NP 08/23/2023  9:33 AM

## 2023-08-23 NOTE — Patient Instructions (Signed)
It is good to see you today. Your biopsy was positive for squamous cell lung cancer of the left upper lobe. I have referred you to thoracic surgery and radiation oncology. You will get calls to get these scheduled.  Congratulations on quitting smoking. Keep up the good work. If you need additional help consider: You can receive free nicotine replacement therapy (patches, gum, or mints) by calling 1-800-QUIT NOW. Please call so we can get you on the path to becoming a non-smoker. I know it is hard, but you can do this!  Hypnosis for smoking cessation  [employer] [phone]  Acupuncture for smoking cessation  [employer] [phone]  Please call if you need [place] sooner. Please contact office for sooner follow up if symptoms do not improve or worsen or seek emergency care

## 2023-08-23 NOTE — Telephone Encounter (Signed)
11/26 @ 11:25 am Left voicemail for patient to call our office to be schedule for consult.

## 2023-08-24 NOTE — Progress Notes (Signed)
Location of tumor and Histology per Pathology Report:  Left Upper lung  Biopsy:    Past/Anticipated interventions by surgeon, if any: left       Past/Anticipated interventions by medical oncology, if any : None at this time    Pain issues, if any:  no   SAFETY ISSUES: Prior radiation? no Pacemaker/ICD? no Possible current pregnancy? no Is the patient on methotrexate? no  Current Complaints / other details:   Patient to follow up with Dr. Dorris Fetch on 09/08/23. Patient states he is leaning more towards surgery. Still wants to hear about radiation.      BP (!) 153/78 (BP Location: Right Arm, Patient Position: Sitting, Cuff Size: Large)   Pulse 74   Temp 97.8 F (36.6 C)   Resp 20   Ht 5\' 11"  (1.803 m)   Wt 217 lb (98.4 kg)   SpO2 99%   BMI 30.27 kg/m

## 2023-08-29 NOTE — Progress Notes (Signed)
Radiation Oncology         [phone] ________________________________  Initial Outpatient Consultation  Name: Cameron [name] MRN: [id number]  Date: 08/30/2023  DOB: [birth year]  WU:[id number], Genene Churn, NP  Josephine Igo, DO   REFERRING PHYSICIAN: Josephine Igo, DO  DIAGNOSIS: {There were no encounter diagnoses. (Refresh or delete this SmartLink)}  Squamous cell carcinoma of the left upper lobe   HISTORY OF PRESENT ILLNESS::Cameron [name] is a 72 y.o. male who is accompanied by ***. he is seen as a courtesy of Dr. Tonia Brooms for an opinion concerning radiation therapy as part of management for his recently diagnosed left lung cancer. The patient is a current smoker with a 64 pack year smoking history and participates in the lung cancer screening program.   He presented for a lung cancer screening CT on 06/30/23 which showed left perihilar masslike soft tissue fullness with associated narrowing of the left upper lobe bronchus, peribronchovascular nodularity, mucoid impaction, and a consolidation in the left upper lobe.   He subsequently presented for a chest CT with contrast on 07/16/23 which showed persistence of the soft tissue fullness in the LUL bronchus, suspicious for a centrally obstructive neoplasm. Significant improvement of the surrounding left upper lobe aeration was also demonstrated, most consistent with decreased postobstructive pneumonitis. CT otherwise showed no evidence of thoracic adenopathy.    A PET scan was then performed on 08/02/23 which showed marked hypermetabolism associated with the soft tissue fullness around the LUL bronchus, compatible with primary bronchogenic carcinoma. No evidence of hypermetabolic mediastinal or right hilar lymphadenopathy was demonstrated. However, a single focus of FDG uptake in the medial right hepatic lobe was demonstrated which appeared to be superimposed on a background of mottled FDG hepatic accumulation. To further evaluate the small focus  of uptake in the liver, an MRI of the abdomen was later performed on 08/12/23 which showed no evidence of metastatic disease in the liver or abdomen. Other notable findings included an infrarenal abdominal aortic aneurysm measuring up to 4.1 cm.   Accordingly, the patient was referred to Dr. Tonia Brooms and underwent a bronchoscopy with biopsies of the LUL on 08/15/23. Biopsy of the left upper lobe showed findings consistent with squamous cell carcinoma.   He has had PFTs done which indicate that he would be a good candidate for surgery. He is however a bit anxious about this option and will speak with thoracic surgery to learn more.    Of note: the patient has a history of prostate cancer - adenocarcinoma of the prostate, s/p radical prostatectomy and nodal excisions on 09/30/22.   PREVIOUS RADIATION THERAPY: No  PAST MEDICAL HISTORY:  Past Medical History:  Diagnosis Date   Adenomatous colon polyp 05/2009; 11/2014   2010:Tubular adenoma, no high grade dysplasia.Marland Kitchen  + Hyperplastic polyps. 2016: Tubular adenoma-rpt 5 yrs.   Cancer (HCC)    TURP for prostate cancer   COPD (chronic obstructive pulmonary disease) (HCC)    Coronary artery disease    H/O chronic gastritis 05/2009   EGD: no h.pylori,dysplasia,or evidence of malignancy   Hyperlipidemia    lovaza from prior PMD-stopped due to cost   Hypertension    Obesity, Class I, BMI 30.0-34.9 (see actual BMI) 04/23/2014   Tobacco dependence     PAST SURGICAL HISTORY: Past Surgical History:  Procedure Laterality Date   APPENDECTOMY  09/28/1971   BIOPSY  08/15/2023   Procedure: BIOPSY;  Surgeon: Josephine Igo, DO;  Location: [facility];  Service: Pulmonary;;  BLADDER DIVERTICULECTOMY N/A 09/30/2022   Procedure: BLADDER DIVERTICULECTOMY;  Surgeon: Heloise Purpura, MD;  Location: [facility];  Service: Urology;  Laterality: N/A;   CARPAL TUNNEL RELEASE Left    CHOLECYSTECTOMY N/A 07/06/2018   Procedure: LAPAROSCOPIC CHOLECYSTECTOMY WITH  INTRAOPERATIVE CHOLANGIOGRAM;  Surgeon: Ovidio Kin, MD;  Location: [facility];  Service: General;  Laterality: N/A;   COLONOSCOPY  09/27/2008   [facility]   CYSTOSCOPY N/A 09/30/2022   Procedure: Derinda Late;  Surgeon: Heloise Purpura, MD;  Location: [facility];  Service: Urology;  Laterality: N/A;   EYE SURGERY  2018   Cataracts removed only   FINGER SURGERY Left 12/2021   Left middle finger, laceration   HEMOSTASIS CONTROL  08/15/2023   Procedure: HEMOSTASIS CONTROL;  Surgeon: Josephine Igo, DO;  Location: [facility];  Service: Pulmonary;;   ROBOT ASSISTED LAPAROSCOPIC RADICAL PROSTATECTOMY N/A 09/30/2022   Procedure: XI ROBOTIC ASSISTED LAPAROSCOPIC RADICAL PROSTATECTOMY LEVEL 3;  Surgeon: Heloise Purpura, MD;  Location: [facility];  Service: Urology;  Laterality: N/A;  270 MINUTES NEEDED FOR CASE   STRABISMUS SURGERY  age 62 or 9   TRANSURETHRAL RESECTION OF PROSTATE N/A 07/08/2022   Procedure: TRANSURETHRAL RESECTION OF THE PROSTATE (TURP)/ CYSTOSCOPY;  Surgeon: Heloise Purpura, MD;  Location: [facility];  Service: Urology;  Laterality: N/A;   VIDEO BRONCHOSCOPY N/A 08/15/2023   Procedure: VIDEO BRONCHOSCOPY WITHOUT FLUORO;  Surgeon: Josephine Igo, DO;  Location: [facility];  Service: Pulmonary;  Laterality: N/A;    FAMILY HISTORY:  Family History  Problem Relation Age of Onset   Diabetes Mother    Heart disease Mother    COPD Father    Hypertension Father    Hyperlipidemia Father    Heart attack Father    Dementia Father    Alcohol abuse Maternal Grandfather     SOCIAL HISTORY:  Social History   Tobacco Use   Smoking status: Every Day    Current packs/day: 1.00    Average packs/day: 1 pack/day for 40.0 years (40.0 ttl pk-yrs)    Types: Cigarettes   Smokeless tobacco: Never  Vaping Use   Vaping status: Never Used  Substance Use Topics   Alcohol use: Not Currently    Comment: rare   Drug use: No    ALLERGIES: No Known Allergies  MEDICATIONS:  Current Outpatient  Medications  Medication Sig Dispense Refill   atorvastatin (LIPITOR) 20 MG tablet Take 1 tablet (20 mg total) by mouth daily. 90 tablet 1   benazepril (LOTENSIN) 40 MG tablet Take 1 tablet (40 mg total) by mouth daily. 90 tablet 1   diphenhydrAMINE HCl, Sleep, (SLEEP AID) 25 MG CAPS Take 25 mg by mouth at bedtime.     fluticasone-salmeterol (ADVAIR DISKUS) 250-50 MCG/ACT AEPB Inhale 1 puff into the lungs in the morning and at bedtime. 180 each 0   hydrochlorothiazide (MICROZIDE) 12.5 MG capsule Take 1 capsule (12.5 mg total) by mouth daily. 90 capsule 1   sildenafil (VIAGRA) 50 MG tablet Take 0.5-2 tablets (25-100 mg total) by mouth daily as needed for erectile dysfunction. 30 tablet 3   traZODone (DESYREL) 50 MG tablet Take 0.5-1 tablets (25-50 mg total) by mouth at bedtime as needed for sleep. 30 tablet 0   valACYclovir (VALTREX) 1000 MG tablet Take 1,000 mg by mouth 2 (two) times daily.     No current facility-administered medications for this encounter.    REVIEW OF SYSTEMS:  A 10+ POINT REVIEW OF SYSTEMS WAS OBTAINED including neurology, dermatology, psychiatry, cardiac,  respiratory, lymph, extremities, GI, GU, musculoskeletal, constitutional, reproductive, HEENT. ***   PHYSICAL EXAM:  vitals were not taken for this visit.   General: Alert and oriented, in no acute distress HEENT: Head is normocephalic. Extraocular movements are intact. Oropharynx is clear. Neck: Neck is supple, no palpable cervical or supraclavicular lymphadenopathy. Heart: Regular in rate and rhythm with no murmurs, rubs, or gallops. Chest: Clear to auscultation bilaterally, with no rhonchi, wheezes, or rales. Abdomen: Soft, nontender, nondistended, with no rigidity or guarding. Extremities: No cyanosis or edema. Lymphatics: see Neck Exam Skin: No concerning lesions. Musculoskeletal: symmetric strength and muscle tone throughout. Neurologic: Cranial nerves II through XII are grossly intact. No obvious focalities.  Speech is fluent. Coordination is intact. Psychiatric: Judgment and insight are intact. Affect is appropriate. ***  ECOG = ***  0 - Asymptomatic (Fully active, able to carry on all predisease activities without restriction)  1 - Symptomatic but completely ambulatory (Restricted in physically strenuous activity but ambulatory and able to carry out work of a light or sedentary nature. For example, light housework, office work)  2 - Symptomatic, <50% in bed during the day (Ambulatory and capable of all self care but unable to carry out any work activities. Up and about more than 50% of waking hours)  3 - Symptomatic, >50% in bed, but not bedbound (Capable of only limited self-care, confined to bed or chair 50% or more of waking hours)  4 - Bedbound (Completely disabled. Cannot carry on any self-care. Totally confined to bed or chair)  5 - Death   Santiago Glad MM, Creech RH, Tormey DC, et al. [phone]). "Toxicity and response criteria of the [facility]". Am. Evlyn Clines. Oncol. 5 (6): 649-55  LABORATORY DATA:  Lab Results  Component Value Date   WBC 7.5 04/25/2023   HGB 16.3 08/15/2023   HCT 48.0 08/15/2023   MCV 94.6 04/25/2023   PLT 256.0 04/25/2023   NEUTROABS 6.7 04/28/2022   Lab Results  Component Value Date   NA 137 08/15/2023   K 4.1 08/15/2023   CL 102 08/15/2023   CO2 30 04/25/2023   GLUCOSE 102 (H) 08/15/2023   BUN 10 08/15/2023   CREATININE 0.80 08/15/2023   CALCIUM 9.2 04/25/2023      RADIOGRAPHY: MR ABDOMEN WWO CONTRAST  Result Date: 08/19/2023 CLINICAL DATA:  Lung cancer suspected FDG avid liver metastasis on prior PET-CT EXAM: MRI ABDOMEN WITHOUT AND WITH CONTRAST TECHNIQUE: Multiplanar multisequence MR imaging of the abdomen was performed both before and after the administration of intravenous contrast. CONTRAST:  9mL GADAVIST GADOBUTROL 1 MMOL/ML IV SOLN COMPARISON:  PET-CT, 08/02/2023 FINDINGS: Lower chest: No acute abnormality. Hepatobiliary: No  focal liver abnormality is seen. Status post cholecystectomy. No biliary dilatation. Pancreas: Unremarkable. No pancreatic ductal dilatation or surrounding inflammatory changes. Spleen: Normal in size without significant abnormality. Adrenals/Urinary Tract: Adrenal glands are unremarkable. Kidneys are normal, without renal calculi, solid lesion, or hydronephrosis. Distended urinary bladder, partially imaged in the lower abdomen. Stomach/Bowel: Stomach is within normal limits. No evidence of bowel wall thickening, distention, or inflammatory changes. Vascular/Lymphatic: Severe aortic atherosclerosis. Aneurysm of the infrarenal abdominal aorta measuring up to 4.1 x 4.0 cm (series 12, image 59). No enlarged abdominal lymph nodes. Other: No abdominal wall hernia or abnormality. No ascites. Musculoskeletal: No acute or significant osseous findings. IMPRESSION: 1. No evidence of metastatic disease in the abdomen. Specifically, no focal liver abnormality or suspicious contrast enhancement to correspond to prior PET-CT finding in the right lobe of the  liver. 2. Aneurysm of the infrarenal abdominal aorta measuring up to 4.1 cm. Recommend follow-up CT or MR as appropriate in 12 months and referral to or continued care with vascular specialist if clinically appropriate. (Ref.: J Vasc Surg. 2018; 67:2-77 and J Am Coll Radiol 2013;10(10):789-794.) 3. Distended urinary bladder, partially imaged in the lower abdomen. Correlate for urinary retention. 4. Status post cholecystectomy. Aortic Atherosclerosis (ICD10-I70.0). Electronically Signed   By: Jearld Lesch M.D.   On: 08/19/2023 11:18   NM PET Image Initial (PI) Skull Base To Thigh (F-18 FDG)  Result Date: 08/03/2023 CLINICAL DATA:  Initial treatment strategy for pulmonary nodule. EXAM: NUCLEAR MEDICINE PET SKULL BASE TO THIGH TECHNIQUE: 10.5 mCi F-18 FDG was injected intravenously. Full-ring PET imaging was performed from the skull base to thigh after the radiotracer. CT  data was obtained and used for attenuation correction and anatomic localization. Fasting blood glucose: 106 mg/dl COMPARISON:  Chest CT 16/06/9603 FINDINGS: Mediastinal blood pool activity: SUV max 2.1 Liver activity: SUV max NA NECK: No hypermetabolic lymph nodes in the neck. Incidental CT findings: None. CHEST: The soft tissue fullness identified around the left upper lobe bronchus on recent diagnostic CT chest is markedly hypermetabolic with SUV max = 14.0. No other hypermetabolic pulmonary nodule or mass. No hypermetabolic mediastinal, right hilar, or axillary lymphadenopathy. Incidental CT findings: Coronary artery calcification is evident. Mild atherosclerotic calcification is noted in the wall of the thoracic aorta. Centrilobular emphsyema noted. Tree-in-bud nodularity anterior left upper lobe is similar to prior. ABDOMEN/PELVIS: FDG uptake in the liver parenchyma is somewhat mottled, but there is a single focus of uptake that projects above the background mottled appearance, localizing to the medial right hepatic lobe with SUV max = 5.1. This is a relatively tiny focus of uptake with no underlying abnormality on noncontrast CT imaging. No abnormal hypermetabolic activity within the pancreas, adrenal glands, or spleen. No hypermetabolic lymph nodes in the abdomen or pelvis. Incidental CT findings: Infrarenal abdominal aorta measures 4.3 cm diameter. There is moderate atherosclerotic calcification of the abdominal aorta without aneurysm. Small cyst noted interpolar left kidney. Prostate gland surgically absent. SKELETON: No focal hypermetabolic activity to suggest skeletal metastasis. Incidental CT findings: None. IMPRESSION: 1. The soft tissue fullness identified around the left upper lobe bronchus on recent diagnostic CT chest is markedly hypermetabolic with SUV max = 14.0. Imaging features are compatible with primary bronchogenic carcinoma. 2. No evidence for hypermetabolic mediastinal or right hilar  lymphadenopathy. 3. Single focus of FDG uptake in the medial right hepatic lobe with SUV max = 5.1 superimposed on a background of mottled FDG hepatic accumulation. This is a relatively tiny focus of uptake with no underlying abnormality on noncontrast CT imaging. The possibility of metastatic liver lesion cannot be completely excluded. MRI abdomen with and without contrast recommended to further evaluate. 4. Tree-in-bud nodularity anterior left upper lobe is similar to prior, compatible with postobstructive etiology 5.  Aortic Atherosclerosis (ICD10-I70.0). Electronically Signed   By: Kennith Center M.D.   On: 08/03/2023 07:26      IMPRESSION: Squamous cell carcinoma of the left upper lobe   ***  Today, I talked to the patient and family about the findings and work-up thus far.  We discussed the natural history of *** and general treatment, highlighting the role of radiotherapy in the management.  We discussed the available radiation techniques, and focused on the details of logistics and delivery.  We reviewed the anticipated acute and late sequelae associated with radiation in this setting.  The patient was encouraged to ask questions that I answered to the best of my ability. *** A patient consent form was discussed and signed.  We retained a copy for our records.  The patient would like to proceed with radiation and will be scheduled for CT simulation.  PLAN: ***    *** minutes of total time was spent for this patient encounter, including preparation, face-to-face counseling with the patient and coordination of care, physical exam, and documentation of the encounter.   ------------------------------------------------  Billie Lade, PhD, MD  This document serves as a record of services personally performed by Antony Blackbird, MD. It was created on his behalf by Neena Rhymes, a trained medical scribe. The creation of this record is based on the scribe's personal observations and the provider's  statements to them. This document has been checked and approved by the attending provider.

## 2023-08-29 NOTE — Progress Notes (Signed)
Pathology discussed at last office visit with SG   Thanks,  BLI  Josephine Igo, DO [facility] 08/29/2023 11:35 AM

## 2023-08-30 ENCOUNTER — Ambulatory Visit
Admission: RE | Admit: 2023-08-30 | Discharge: 2023-08-30 | Disposition: A | Discharge status: Home / Self Care | Payer: HMO | Source: Ambulatory Visit | Attending: Radiation Oncology | Admitting: Radiation Oncology

## 2023-08-30 ENCOUNTER — Telehealth: Payer: Self-pay | Admitting: *Deleted

## 2023-08-30 ENCOUNTER — Encounter: Payer: Self-pay | Admitting: Radiation Oncology

## 2023-08-30 ENCOUNTER — Other Ambulatory Visit (HOSPITAL_COMMUNITY): Payer: Self-pay

## 2023-08-30 ENCOUNTER — Other Ambulatory Visit: Payer: Self-pay | Admitting: Nurse Practitioner

## 2023-08-30 VITALS — BP 153/78 | HR 74 | Temp 97.8°F | Resp 20 | Ht 71.0 in | Wt 217.0 lb

## 2023-08-30 DIAGNOSIS — Z9049 Acquired absence of other specified parts of digestive tract: Secondary | ICD-10-CM | POA: Insufficient documentation

## 2023-08-30 DIAGNOSIS — F17210 Nicotine dependence, cigarettes, uncomplicated: Secondary | ICD-10-CM | POA: Diagnosis not present

## 2023-08-30 DIAGNOSIS — Z79899 Other long term (current) drug therapy: Secondary | ICD-10-CM | POA: Insufficient documentation

## 2023-08-30 DIAGNOSIS — I10 Essential (primary) hypertension: Secondary | ICD-10-CM | POA: Diagnosis not present

## 2023-08-30 DIAGNOSIS — I700 Atherosclerosis of aorta: Secondary | ICD-10-CM | POA: Diagnosis not present

## 2023-08-30 DIAGNOSIS — C3412 Malignant neoplasm of upper lobe, left bronchus or lung: Secondary | ICD-10-CM

## 2023-08-30 DIAGNOSIS — I7143 Infrarenal abdominal aortic aneurysm, without rupture: Secondary | ICD-10-CM | POA: Diagnosis not present

## 2023-08-30 DIAGNOSIS — R911 Solitary pulmonary nodule: Secondary | ICD-10-CM

## 2023-08-30 DIAGNOSIS — E785 Hyperlipidemia, unspecified: Secondary | ICD-10-CM | POA: Insufficient documentation

## 2023-08-30 DIAGNOSIS — I2510 Atherosclerotic heart disease of native coronary artery without angina pectoris: Secondary | ICD-10-CM | POA: Insufficient documentation

## 2023-08-30 DIAGNOSIS — Z79624 Long term (current) use of inhibitors of nucleotide synthesis: Secondary | ICD-10-CM | POA: Insufficient documentation

## 2023-08-30 DIAGNOSIS — Z7951 Long term (current) use of inhaled steroids: Secondary | ICD-10-CM | POA: Diagnosis not present

## 2023-08-30 DIAGNOSIS — Z860101 Personal history of adenomatous and serrated colon polyps: Secondary | ICD-10-CM | POA: Insufficient documentation

## 2023-08-30 DIAGNOSIS — J432 Centrilobular emphysema: Secondary | ICD-10-CM

## 2023-08-30 DIAGNOSIS — J449 Chronic obstructive pulmonary disease, unspecified: Secondary | ICD-10-CM | POA: Diagnosis not present

## 2023-08-30 DIAGNOSIS — C3411 Malignant neoplasm of upper lobe, right bronchus or lung: Secondary | ICD-10-CM | POA: Diagnosis not present

## 2023-08-30 LAB — CBC WITH DIFFERENTIAL (CANCER CENTER ONLY)
Abs Immature Granulocytes: 0.06 10*3/uL (ref 0.00–0.07)
Basophils Absolute: 0.1 10*3/uL (ref 0.0–0.1)
Basophils Relative: 1 %
Eosinophils Absolute: 0.2 10*3/uL (ref 0.0–0.5)
Eosinophils Relative: 2 %
HCT: 48.8 % (ref 39.0–52.0)
Hemoglobin: 17.1 g/dL — ABNORMAL HIGH (ref 13.0–17.0)
Immature Granulocytes: 1 %
Lymphocytes Relative: 19 %
Lymphs Abs: 2.1 10*3/uL (ref 0.7–4.0)
MCH: 32.6 pg (ref 26.0–34.0)
MCHC: 35 g/dL (ref 30.0–36.0)
MCV: 93 fL (ref 80.0–100.0)
Monocytes Absolute: 0.8 10*3/uL (ref 0.1–1.0)
Monocytes Relative: 7 %
Neutro Abs: 7.7 10*3/uL (ref 1.7–7.7)
Neutrophils Relative %: 70 %
Platelet Count: 261 10*3/uL (ref 150–400)
RBC: 5.25 MIL/uL (ref 4.22–5.81)
RDW: 13.3 % (ref 11.5–15.5)
WBC Count: 11 10*3/uL — ABNORMAL HIGH (ref 4.0–10.5)
nRBC: 0 % (ref 0.0–0.2)

## 2023-08-30 LAB — BASIC METABOLIC PANEL - CANCER CENTER ONLY
Anion gap: 4 — ABNORMAL LOW (ref 5–15)
BUN: 13 mg/dL (ref 8–23)
CO2: 33 mmol/L — ABNORMAL HIGH (ref 22–32)
Calcium: 9.7 mg/dL (ref 8.9–10.3)
Chloride: 101 mmol/L (ref 98–111)
Creatinine: 0.83 mg/dL (ref 0.61–1.24)
GFR, Estimated: >60 mL/min (ref >60)
Glucose, Bld: 75 mg/dL (ref 70–99)
Potassium: 4.3 mmol/L (ref 3.5–5.1)
Sodium: 138 mmol/L (ref 135–145)

## 2023-08-30 NOTE — Telephone Encounter (Signed)
Called patient to inform of MRI for 09-02-23- arrival time- 6:45 am @ [facility], no restrictions to scan, spoke with patient and he is aware of this scan

## 2023-08-31 ENCOUNTER — Other Ambulatory Visit (HOSPITAL_COMMUNITY): Payer: Self-pay

## 2023-08-31 MED ORDER — FLUTICASONE-SALMETEROL 250-50 MCG/ACT IN AEPB
1.0000 | INHALATION_SPRAY | Freq: Two times a day (BID) | RESPIRATORY_TRACT | 0 refills | Status: DC
  Filled 2023-08-31: qty 180, 90d supply, fill #0

## 2023-09-01 ENCOUNTER — Telehealth: Payer: Self-pay | Admitting: Acute Care

## 2023-09-01 ENCOUNTER — Other Ambulatory Visit: Payer: Self-pay

## 2023-09-01 NOTE — Telephone Encounter (Signed)
I called and spoke with the pt  He states that he had a recent xray of his mouth that showed dental infection  They also noticed abnormality in his sinus cavity He is concerned that this will affect his potential chest surgery  I advised he should discuss this with Dr Dorris Fetch  Pt verbalized understanding  Nothing further needed

## 2023-09-01 NOTE — Telephone Encounter (Signed)
Patients wife is calling. Patient had a recent x ray taken and it showed cyst/closure in his sinus cavity. Patient will be seeing the surgeon next Thursday for a consultation for his lung surgery and was wondering if this nasal cavity issue would interfere with him having surgery. Patient will also be having an MRI of his brain tomorrow. Sending back as urgent due to time. Call back number [phone].

## 2023-09-02 ENCOUNTER — Ambulatory Visit (HOSPITAL_COMMUNITY)
Admission: RE | Admit: 2023-09-02 | Discharge: 2023-09-02 | Disposition: A | Discharge status: Home / Self Care | Payer: HMO | Source: Ambulatory Visit | Attending: Radiation Oncology | Admitting: Radiation Oncology

## 2023-09-02 ENCOUNTER — Other Ambulatory Visit (HOSPITAL_COMMUNITY): Payer: Self-pay

## 2023-09-02 DIAGNOSIS — C3412 Malignant neoplasm of upper lobe, left bronchus or lung: Secondary | ICD-10-CM | POA: Diagnosis not present

## 2023-09-02 DIAGNOSIS — C3490 Malignant neoplasm of unspecified part of unspecified bronchus or lung: Secondary | ICD-10-CM | POA: Diagnosis not present

## 2023-09-02 MED ORDER — GADOBUTROL 1 MMOL/ML IV SOLN
9.0000 mL | Freq: Once | INTRAVENOUS | Status: AC | PRN
  Administered 2023-09-02: 9 mL via INTRAVENOUS

## 2023-09-02 NOTE — Progress Notes (Unsigned)
The proposed treatment discussed in conference is for discussion purpose only and is not a binding recommendation.  The patients have not been physically examined, or presented with their treatment options.  Therefore, final treatment plans cannot be decided.  

## 2023-09-08 ENCOUNTER — Institutional Professional Consult (permissible substitution) (INDEPENDENT_AMBULATORY_CARE_PROVIDER_SITE_OTHER): Payer: HMO | Admitting: Thoracic Surgery (Cardiothoracic Vascular Surgery)

## 2023-09-08 ENCOUNTER — Encounter: Payer: Self-pay | Admitting: Thoracic Surgery (Cardiothoracic Vascular Surgery)

## 2023-09-08 VITALS — BP 145/84 | HR 78 | Resp 20 | Ht 71.0 in | Wt 217.0 lb

## 2023-09-08 DIAGNOSIS — R911 Solitary pulmonary nodule: Secondary | ICD-10-CM | POA: Diagnosis not present

## 2023-09-08 DIAGNOSIS — M5136 Other intervertebral disc degeneration, lumbar region with discogenic back pain only: Secondary | ICD-10-CM | POA: Diagnosis not present

## 2023-09-08 DIAGNOSIS — M9905 Segmental and somatic dysfunction of pelvic region: Secondary | ICD-10-CM | POA: Diagnosis not present

## 2023-09-08 DIAGNOSIS — M9903 Segmental and somatic dysfunction of lumbar region: Secondary | ICD-10-CM | POA: Diagnosis not present

## 2023-09-08 DIAGNOSIS — M9904 Segmental and somatic dysfunction of sacral region: Secondary | ICD-10-CM | POA: Diagnosis not present

## 2023-09-08 NOTE — Progress Notes (Signed)
PCP is Toney Reil, Genene Churn, NP Referring Provider is Bevelyn Ngo, NP  Chief Complaint  Patient presents with   Lung Cancer    Surgical consult/ Chest CT 07/16/23/ PET Scan 08/02/23 PFT's 08/12/23/ MR Brain 09/02/23    HPI:  Cameron [name] is a 72 year old smoker with a history of COPD, hypertension, hyperlipidemia, obesity, coronary calcification, prostate cancer status post TURP, colon polyp, and squamous of carcinoma of the skin.  He has smoked about a pack to a pack and a half a day for 50 years.  Currently smoking three fourths of a pack per day.  He had a low-dose CT for lung cancer screening in October.  It showed a left hilar fullness.  That was followed by a CT of the chest with contrast.  Which showed approximately 1.7 cm hilar mass in the left upper lobe.  PET/CT showed the mass was hypermetabolic.  He underwent navigational bronchoscopy by Dr. Tonia Brooms.  He noted a an endobronchial mass in the left upper lobe apical segmental bronchus.  Biopsy showed squamous cell carcinoma.  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  Currently only working when he wants to, but does not have any physical limitations when he does so.  Can walk up 2 flights of stairs without difficulty.  Denies any chest pain, pressure, tightness, or shortness of breath.  No change in appetite or unintentional weight loss.  Zubrod Score: At the time of surgery this patient's most appropriate activity status/level should be described as: [x]     0    Normal activity, no symptoms []     1    Restricted in physical strenuous activity but ambulatory, able to do out light work []     2    Ambulatory and capable of self care, unable to do work activities, up and about >50 % of waking hours                              []     3    Only limited self care, in bed greater than 50% of waking hours []     4    Completely disabled, no self care, confined to bed or chair []     5    Moribund  Past Medical  History:  Diagnosis Date   Adenomatous colon polyp 05/2009; 11/2014   2010:Tubular adenoma, no high grade dysplasia.Marland Kitchen  + Hyperplastic polyps. 2016: Tubular adenoma-rpt 5 yrs.   Cancer [facility])    TURP for prostate cancer   COPD (chronic obstructive pulmonary disease) (HCC)    Coronary artery disease    H/O chronic gastritis 05/2009   EGD: no h.pylori,dysplasia,or evidence of malignancy   Hyperlipidemia    lovaza from prior PMD-stopped due to cost   Hypertension    Obesity, Class I, BMI 30.0-34.9 (see actual BMI) 04/23/2014   Tobacco dependence     Past Surgical History:  Procedure Laterality Date   APPENDECTOMY  09/28/1971   BIOPSY  08/15/2023   Procedure: BIOPSY;  Surgeon: Josephine Igo, DO;  Location: [facility];  Service: Pulmonary;;   BLADDER DIVERTICULECTOMY N/A 09/30/2022   Procedure: BLADDER DIVERTICULECTOMY;  Surgeon: Heloise Purpura, MD;  Location: [facility];  Service: Urology;  Laterality: N/A;   CARPAL TUNNEL RELEASE Left    CHOLECYSTECTOMY N/A 07/06/2018   Procedure: LAPAROSCOPIC CHOLECYSTECTOMY WITH INTRAOPERATIVE CHOLANGIOGRAM;  Surgeon: Ovidio Kin, MD;  Location: [facility] OR;  Service: General;  Laterality: N/A;   COLONOSCOPY  09/27/2008   [facility]   CYSTOSCOPY N/A 09/30/2022   Procedure: CYSTOSCOPY FLEXIBLE;  Surgeon: Heloise Purpura, MD;  Location: [facility];  Service: Urology;  Laterality: N/A;   EYE SURGERY  2018   Cataracts removed only   FINGER SURGERY Left 12/2021   Left middle finger, laceration   HEMOSTASIS CONTROL  08/15/2023   Procedure: HEMOSTASIS CONTROL;  Surgeon: Josephine Igo, DO;  Location: [facility];  Service: Pulmonary;;   ROBOT ASSISTED LAPAROSCOPIC RADICAL PROSTATECTOMY N/A 09/30/2022   Procedure: XI ROBOTIC ASSISTED LAPAROSCOPIC RADICAL PROSTATECTOMY LEVEL 3;  Surgeon: Heloise Purpura, MD;  Location: [facility];  Service: Urology;  Laterality: N/A;  270 MINUTES NEEDED FOR CASE   STRABISMUS SURGERY  age 67 or 9   TRANSURETHRAL RESECTION OF PROSTATE N/A  07/08/2022   Procedure: TRANSURETHRAL RESECTION OF THE PROSTATE (TURP)/ CYSTOSCOPY;  Surgeon: Heloise Purpura, MD;  Location: [facility];  Service: Urology;  Laterality: N/A;   VIDEO BRONCHOSCOPY N/A 08/15/2023   Procedure: VIDEO BRONCHOSCOPY WITHOUT FLUORO;  Surgeon: Josephine Igo, DO;  Location: [facility];  Service: Pulmonary;  Laterality: N/A;    Family History  Problem Relation Age of Onset   Diabetes Mother    Heart disease Mother    COPD Father    Hypertension Father    Hyperlipidemia Father    Heart attack Father    Dementia Father    Alcohol abuse Maternal Grandfather     Social History Social History   Tobacco Use   Smoking status: Every Day    Current packs/day: 1.00    Average packs/day: 1 pack/day for 40.0 years (40.0 ttl pk-yrs)    Types: Cigarettes   Smokeless tobacco: Never  Vaping Use   Vaping status: Never Used  Substance Use Topics   Alcohol use: Not Currently    Comment: rare   Drug use: No    Current Outpatient Medications  Medication Sig Dispense Refill   atorvastatin (LIPITOR) 20 MG tablet Take 1 tablet (20 mg total) by mouth daily. 90 tablet 1   benazepril (LOTENSIN) 40 MG tablet Take 1 tablet (40 mg total) by mouth daily. 90 tablet 1   diphenhydrAMINE HCl, Sleep, (SLEEP AID) 25 MG CAPS Take 25 mg by mouth at bedtime.     fluticasone-salmeterol (ADVAIR DISKUS) 250-50 MCG/ACT AEPB Inhale 1 puff into the lungs in the morning and at bedtime. 180 each 0   hydrochlorothiazide (MICROZIDE) 12.5 MG capsule Take 1 capsule (12.5 mg total) by mouth daily. 90 capsule 1   sildenafil (VIAGRA) 50 MG tablet Take 0.5-2 tablets (25-100 mg total) by mouth daily as needed for erectile dysfunction. 30 tablet 3   traZODone (DESYREL) 50 MG tablet Take 0.5-1 tablets (25-50 mg total) by mouth at bedtime as needed for sleep. 30 tablet 0   valACYclovir (VALTREX) 1000 MG tablet Take 1,000 mg by mouth 2 (two) times daily.     No current facility-administered medications for  this visit.    No Known Allergies  Review of Systems  Constitutional:  Negative for activity change, fatigue and unexpected weight change.  HENT:  Positive for dental problem. Negative for trouble swallowing and voice change.   Eyes:  Positive for visual disturbance (needs glasses).  Respiratory:  Positive for wheezing (occassional). Negative for shortness of breath.   Cardiovascular:  Negative for chest pain, palpitations and leg swelling.  Genitourinary:  Negative for difficulty urinating and dysuria.  Musculoskeletal:  Negative for arthralgias and gait problem.  Neurological:  Negative for seizures and weakness.  Hematological:  Negative for adenopathy. Does not bruise/bleed easily.  All other systems reviewed and are negative.   BP (!) 145/84   Pulse 78   Resp 20   Ht 5\' 11"  (1.803 m)   Wt 217 lb (98.4 kg)   SpO2 98% Comment: RA  BMI 30.27 kg/m  Physical Exam Vitals reviewed.  Constitutional:      General: He is not in acute distress.    Appearance: Normal appearance.  HENT:     Head: Normocephalic and atraumatic.  Eyes:     General: No scleral icterus.    Extraocular Movements: Extraocular movements intact.  Cardiovascular:     Rate and Rhythm: Normal rate and regular rhythm.     Heart sounds: Normal heart sounds. No murmur heard. Pulmonary:     Effort: Pulmonary effort is normal. No respiratory distress.     Breath sounds: Normal breath sounds. No wheezing.  Abdominal:     General: There is no distension.     Palpations: Abdomen is soft.  Musculoskeletal:        General: Deformity (Left third and fourth fingers) present.  Lymphadenopathy:     Cervical: No cervical adenopathy.  Skin:    General: Skin is warm and dry.  Neurological:     General: No focal deficit present.     Mental Status: He is alert and oriented to person, place, and time.     Cranial Nerves: No cranial nerve deficit.     Motor: No weakness.    Diagnostic Tests: CT CHEST WITH  CONTRAST   TECHNIQUE: Multidetector CT imaging of the chest was performed during intravenous contrast administration.   RADIATION DOSE REDUCTION: This exam was performed according to the departmental dose-optimization program which includes automated exposure control, adjustment of the mA and/or kV according to patient size and/or use of iterative reconstruction technique.   CONTRAST:  75mL OMNIPAQUE IOHEXOL 300 MG/ML  SOLN   COMPARISON:  06/30/2023   FINDINGS: Cardiovascular: Aortic atherosclerosis. Tortuous thoracic aorta. Normal heart size, without pericardial effusion. Three vessel coronary artery calcification. Aortic valve calcification. No central pulmonary embolism, on this non-dedicated study.   Mediastinum/Nodes: No supraclavicular adenopathy. No mediastinal adenopathy.   Lungs/Pleura: No pleural fluid.  Moderate centrilobular emphysema.   Persistent soft tissue fullness about the left upper lobe bronchus (estimated at 1.7 cm), including on 79/4 and 78/2. The surrounding left upper lobe demonstrates significantly improved aeration, with near-complete resolution of peribronchovascular ground-glass and nodular consolidation. Residual "tree-in-bud" nodularity is most apparent within the anterior lingula including on 90/4.   Upper Abdomen: Cholecystectomy. Normal imaged portions of the liver, spleen, stomach, pancreas, adrenal glands, right kidney. Incompletely imaged interpolar left renal exophytic 1.6 cm lesion is likely a cyst . In the absence of clinically indicated signs/symptoms require(s) no independent follow-up.   Musculoskeletal: No acute osseous abnormality.   IMPRESSION: 1. Persistent soft tissue fullness about the left upper lobe bronchus, suspicious for a centrally obstructive neoplasm. Infectious/inflammatory etiology felt less likely. Significantly improved surrounding left upper lobe aeration, most consistent with decreased postobstructive  pneumonitis. Recommend multidisciplinary thoracic oncology consultation for eventual bronchoscopy. 2. No thoracic adenopathy. 3. Aortic atherosclerosis (ICD10-I70.0), coronary artery atherosclerosis and emphysema (ICD10-J43.9). 4. Aortic valvular calcifications. Consider echocardiography to evaluate for valvular dysfunction.     Electronically Signed   By: Jeronimo Greaves M.D.   On: 07/19/2023 17:05 NUCLEAR MEDICINE PET SKULL BASE TO THIGH   TECHNIQUE: 10.5 mCi F-18 FDG was  injected intravenously. Full-ring PET imaging was performed from the skull base to thigh after the radiotracer. CT data was obtained and used for attenuation correction and anatomic localization.   Fasting blood glucose: 106 mg/dl   COMPARISON:  Chest CT 07/16/2023   FINDINGS: Mediastinal blood pool activity: SUV max 2.1   Liver activity: SUV max NA   NECK: No hypermetabolic lymph nodes in the neck.   Incidental CT findings: None.   CHEST: The soft tissue fullness identified around the left upper lobe bronchus on recent diagnostic CT chest is markedly hypermetabolic with SUV max = 14.0. No other hypermetabolic pulmonary nodule or mass. No hypermetabolic mediastinal, right hilar, or axillary lymphadenopathy.   Incidental CT findings: Coronary artery calcification is evident. Mild atherosclerotic calcification is noted in the wall of the thoracic aorta. Centrilobular emphsyema noted. Tree-in-bud nodularity anterior left upper lobe is similar to prior.   ABDOMEN/PELVIS: FDG uptake in the liver parenchyma is somewhat mottled, but there is a single focus of uptake that projects above the background mottled appearance, localizing to the medial right hepatic lobe with SUV max = 5.1. This is a relatively tiny focus of uptake with no underlying abnormality on noncontrast CT imaging. No abnormal hypermetabolic activity within the pancreas, adrenal glands, or spleen. No hypermetabolic lymph nodes in the abdomen  or pelvis.   Incidental CT findings: Infrarenal abdominal aorta measures 4.3 cm diameter. There is moderate atherosclerotic calcification of the abdominal aorta without aneurysm. Small cyst noted interpolar left kidney. Prostate gland surgically absent.   SKELETON: No focal hypermetabolic activity to suggest skeletal metastasis.   Incidental CT findings: None.   IMPRESSION: 1. The soft tissue fullness identified around the left upper lobe bronchus on recent diagnostic CT chest is markedly hypermetabolic with SUV max = 14.0. Imaging features are compatible with primary bronchogenic carcinoma. 2. No evidence for hypermetabolic mediastinal or right hilar lymphadenopathy. 3. Single focus of FDG uptake in the medial right hepatic lobe with SUV max = 5.1 superimposed on a background of mottled FDG hepatic accumulation. This is a relatively tiny focus of uptake with no underlying abnormality on noncontrast CT imaging. The possibility of metastatic liver lesion cannot be completely excluded. MRI abdomen with and without contrast recommended to further evaluate. 4. Tree-in-bud nodularity anterior left upper lobe is similar to prior, compatible with postobstructive etiology 5.  Aortic Atherosclerosis (ICD10-I70.0).     Electronically Signed   By: Kennith Center M.D.   On: 08/03/2023 07:26  I personally reviewed the CT and PET/CT images.  There is a roughly 1.7 cm left upper lobe mass adjacent to the main PA that is markedly hypermetabolic on PET/CT.  Hypermetabolic activity in the liver (no lesion seen on MR).  No mediastinal or hilar adenopathy.  Coronary calcification, aortic atherosclerosis.  Pulmonary function testing FVC 3.33 (72%) FEV1 2.23 (65%) FEV1 2.54 (75%) DLCO 24.44 (91%)   Impression: Cameron [name] is a 72 year old smoker with a history of COPD, hypertension, hyperlipidemia, obesity, coronary calcification, prostate cancer status post TURP, colon polyp, squamous of  carcinoma of the skin, and newly diagnosed squamous cell carcinoma of the lung.  Squamous cell carcinoma left upper lobe.  Clinical stage IB-T2a, N0.  T2a due to partially obstructing airway.  Treatment options include surgical resection, chemoradiation, or neoadjuvant chemoimmunotherapy prior to surgical resection.  He has met with Dr. Roselind Messier from radiation.  We did discuss all of these options in detail.  After discussion of the various options he prefers surgical resection with the  understanding that this likely would require a pneumonectomy.  I informed him of the general nature of the procedure including the need for general anesthesia, the incisions to be used, the use of the surgical robot, the possible need for conversion to thoracotomy, the use of a drainage tube postoperatively, the expected hospital stay, and the overall recovery.  I informed him of the indications, risks, benefits, and alternatives.  He understands the risks include, but not limited to death, MI, DVT, PE, bleeding, possible need for transfusion, infection, prolonged air leak, cardiac arrhythmias, respiratory failure, chronic pain issues, as well as the possibility of other unforeseeable complications.  Tobacco abuse-still smoking about three fourths a pack of cigarettes a day.  He needs to quit that prior to surgery.  He needs to quit today.  I informed him of that in no uncertain terms.  MRI of the brain done, reading pending.  No obvious lesions to my review but will rely on the neuroradiologist opinion.  Coronary calcifications on CT-asymptomatic and very active.   Plan: Quit smoking Follow-up on MR brain Plan for robotic left VATS for pneumonectomy or upper lobectomy on 10/02/2022  Loreli Slot, MD [facility] [phone]

## 2023-09-09 ENCOUNTER — Encounter: Payer: Self-pay | Admitting: *Deleted

## 2023-09-09 ENCOUNTER — Other Ambulatory Visit: Payer: Self-pay | Admitting: *Deleted

## 2023-09-09 DIAGNOSIS — R911 Solitary pulmonary nodule: Secondary | ICD-10-CM

## 2023-09-12 DIAGNOSIS — M9904 Segmental and somatic dysfunction of sacral region: Secondary | ICD-10-CM | POA: Diagnosis not present

## 2023-09-12 DIAGNOSIS — M9905 Segmental and somatic dysfunction of pelvic region: Secondary | ICD-10-CM | POA: Diagnosis not present

## 2023-09-12 DIAGNOSIS — M9903 Segmental and somatic dysfunction of lumbar region: Secondary | ICD-10-CM | POA: Diagnosis not present

## 2023-09-12 DIAGNOSIS — M5136 Other intervertebral disc degeneration, lumbar region with discogenic back pain only: Secondary | ICD-10-CM | POA: Diagnosis not present

## 2023-09-14 ENCOUNTER — Telehealth: Payer: Self-pay

## 2023-09-14 NOTE — Telephone Encounter (Signed)
Called patient with MRI results per Dr. Roselind Messier. Patient and spouse voiced understanding.

## 2023-09-15 ENCOUNTER — Other Ambulatory Visit (HOSPITAL_COMMUNITY): Payer: Self-pay

## 2023-09-16 ENCOUNTER — Other Ambulatory Visit (HOSPITAL_COMMUNITY): Payer: Self-pay

## 2023-09-23 DIAGNOSIS — C61 Malignant neoplasm of prostate: Secondary | ICD-10-CM | POA: Diagnosis not present

## 2023-09-26 NOTE — Progress Notes (Signed)
 Surgical Instructions   Your procedure is scheduled on Monday October 03, 2023. Report to [facility] Main Entrance A at 5:30 A.M., then check in with the Admitting office. Any questions or running late day of surgery: call [phone]  Questions prior to your surgery date: call [phone], Monday-Friday, 8am-4pm. If you experience any cold or flu symptoms such as cough, fever, chills, shortness of breath, etc. between now and your scheduled surgery, please notify us  at the above number.     Remember:  Do not or drink eat after midnight the night before your surgery  Take these medicines the morning of surgery with A SIP OF WATER   amoxicillin  (AMOXIL )  atorvastatin  (LIPITOR)  fluticasone -salmeterol (ADVAIR  DISKUS)   May take these medicines IF NEEDED: valACYclovir  (VALTREX )    One week prior to surgery, STOP taking any Aspirin (unless otherwise instructed by your surgeon) Aleve, Naproxen, Ibuprofen , Motrin , Advil , Goody's, BC's, all herbal medications, fish oil, and non-prescription vitamins.                     Do NOT Smoke (Tobacco/Vaping) for 24 hours prior to your procedure.  If you use a CPAP at night, you may bring your mask/headgear for your overnight stay.   You will be asked to remove any contacts, glasses, piercing's, hearing aid's, dentures/partials prior to surgery. Please bring cases for these items if needed.    Patients discharged the day of surgery will not be allowed to drive home, and someone needs to stay with them for 24 hours.  SURGICAL WAITING ROOM VISITATION Patients may have no more than 2 support people in the waiting area - these visitors may rotate.   Pre-op nurse will coordinate an appropriate time for 1 ADULT support person, who may not rotate, to accompany patient in pre-op.  Children under the age of 54 must have an adult with them who is not the patient and must remain in the main waiting area with an adult.  If the patient needs to stay at  the hospital during part of their recovery, the visitor guidelines for inpatient rooms apply.  Please refer to the [facility] website for the visitor guidelines for any additional information.   If you received a COVID test during your pre-op visit  it is requested that you wear a mask when out in public, stay away from anyone that may not be feeling well and notify your surgeon if you develop symptoms. If you have been in contact with anyone that has tested positive in the last 10 days please notify you surgeon.      Pre-operative CHG Bathing Instructions   You can play a key role in reducing the risk of infection after surgery. Your skin needs to be as free of germs as possible. You can reduce the number of germs on your skin by washing with CHG (chlorhexidine  gluconate) soap before surgery. CHG is an antiseptic soap that kills germs and continues to kill germs even after washing.   DO NOT use if you have an allergy to chlorhexidine /CHG or antibacterial soaps. If your skin becomes reddened or irritated, stop using the CHG and notify one of our RNs at [phone].              TAKE A SHOWER THE NIGHT BEFORE SURGERY AND THE DAY OF SURGERY    Please keep in mind the following:  DO NOT shave, including legs and underarms, 48 hours prior to surgery.   You may shave  your face before/day of surgery.  Place clean sheets on your bed the night before surgery Use a clean washcloth (not used since being washed) for each shower. DO NOT sleep with pet's night before surgery.  CHG Shower Instructions:  Wash your face and private area with normal soap. If you choose to wash your hair, wash first with your normal shampoo.  After you use shampoo/soap, rinse your hair and body thoroughly to remove shampoo/soap residue.  Turn the water  OFF and apply half the bottle of CHG soap to a CLEAN washcloth.  Apply CHG soap ONLY FROM YOUR NECK DOWN TO YOUR TOES (washing for 3-5 minutes)  DO NOT use CHG soap on  face, private areas, open wounds, or sores.  Pay special attention to the area where your surgery is being performed.  If you are having back surgery, having someone wash your back for you may be helpful. Wait 2 minutes after CHG soap is applied, then you may rinse off the CHG soap.  Pat dry with a clean towel  Put on clean pajamas    Additional instructions for the day of surgery: DO NOT APPLY any lotions, deodorants or cologne.    Do not wear jewelry Do not bring valuables to the hospital. [facility] is not responsible for valuables/personal belongings. Put on clean/comfortable clothes.  Please brush your teeth.  Ask your nurse before applying any prescription medications to the skin.

## 2023-09-28 HISTORY — PX: OTHER SURGICAL HISTORY: SHX169

## 2023-09-29 ENCOUNTER — Encounter (HOSPITAL_COMMUNITY): Payer: Self-pay

## 2023-09-29 ENCOUNTER — Encounter (HOSPITAL_COMMUNITY)
Admission: RE | Admit: 2023-09-29 | Discharge: 2023-09-29 | Disposition: A | Discharge status: Home / Self Care | Payer: HMO | Source: Ambulatory Visit | Attending: Thoracic Surgery (Cardiothoracic Vascular Surgery) | Admitting: Thoracic Surgery (Cardiothoracic Vascular Surgery)

## 2023-09-29 ENCOUNTER — Other Ambulatory Visit: Payer: Self-pay

## 2023-09-29 ENCOUNTER — Ambulatory Visit (HOSPITAL_COMMUNITY)
Admission: RE | Admit: 2023-09-29 | Discharge: 2023-09-29 | Disposition: A | Discharge status: Home / Self Care | Payer: HMO | Source: Ambulatory Visit | Attending: Thoracic Surgery (Cardiothoracic Vascular Surgery) | Admitting: Thoracic Surgery (Cardiothoracic Vascular Surgery)

## 2023-09-29 VITALS — BP 119/64 | HR 69 | Temp 97.8°F | Resp 20 | Ht 71.0 in | Wt 218.3 lb

## 2023-09-29 DIAGNOSIS — R911 Solitary pulmonary nodule: Secondary | ICD-10-CM | POA: Insufficient documentation

## 2023-09-29 DIAGNOSIS — I700 Atherosclerosis of aorta: Secondary | ICD-10-CM | POA: Diagnosis not present

## 2023-09-29 DIAGNOSIS — C3490 Malignant neoplasm of unspecified part of unspecified bronchus or lung: Secondary | ICD-10-CM | POA: Insufficient documentation

## 2023-09-29 DIAGNOSIS — R918 Other nonspecific abnormal finding of lung field: Secondary | ICD-10-CM | POA: Diagnosis not present

## 2023-09-29 DIAGNOSIS — Z1152 Encounter for screening for COVID-19: Secondary | ICD-10-CM | POA: Diagnosis not present

## 2023-09-29 DIAGNOSIS — Z01818 Encounter for other preprocedural examination: Secondary | ICD-10-CM | POA: Diagnosis not present

## 2023-09-29 HISTORY — DX: Failed or difficult intubation, initial encounter: T88.4XXA

## 2023-09-29 HISTORY — DX: Ventral hernia without obstruction or gangrene: K43.9

## 2023-09-29 LAB — URINALYSIS, ROUTINE W REFLEX MICROSCOPIC
Bilirubin Urine: NEGATIVE
Glucose, UA: NEGATIVE mg/dL
Ketones, ur: NEGATIVE mg/dL
Nitrite: NEGATIVE
Protein, ur: 30 mg/dL — AB
Specific Gravity, Urine: 1.014 (ref 1.005–1.030)
WBC, UA: >50 WBC/hpf (ref 0–5)
pH: 5 (ref 5.0–8.0)

## 2023-09-29 LAB — COMPREHENSIVE METABOLIC PANEL
ALT: 20 U/L (ref 0–44)
AST: 19 U/L (ref 15–41)
Albumin: 3.9 g/dL (ref 3.5–5.0)
Alkaline Phosphatase: 83 U/L (ref 38–126)
Anion gap: 8 (ref 5–15)
BUN: 12 mg/dL (ref 8–23)
CO2: 28 mmol/L (ref 22–32)
Calcium: 9.4 mg/dL (ref 8.9–10.3)
Chloride: 103 mmol/L (ref 98–111)
Creatinine, Ser: 0.87 mg/dL (ref 0.61–1.24)
GFR, Estimated: >60 mL/min (ref >60)
Glucose, Bld: 97 mg/dL (ref 70–99)
Potassium: 3.8 mmol/L (ref 3.5–5.1)
Sodium: 139 mmol/L (ref 135–145)
Total Bilirubin: 0.8 mg/dL (ref 0.0–1.2)
Total Protein: 6.8 g/dL (ref 6.5–8.1)

## 2023-09-29 LAB — TYPE AND SCREEN
ABO/RH(D): O POS
Antibody Screen: NEGATIVE

## 2023-09-29 LAB — CBC
HCT: 49.4 % (ref 39.0–52.0)
Hemoglobin: 16.4 g/dL (ref 13.0–17.0)
MCH: 31.2 pg (ref 26.0–34.0)
MCHC: 33.2 g/dL (ref 30.0–36.0)
MCV: 93.9 fL (ref 80.0–100.0)
Platelets: 256 10*3/uL (ref 150–400)
RBC: 5.26 MIL/uL (ref 4.22–5.81)
RDW: 13.2 % (ref 11.5–15.5)
WBC: 9.8 10*3/uL (ref 4.0–10.5)
nRBC: 0 % (ref 0.0–0.2)

## 2023-09-29 LAB — APTT: aPTT: 35 s (ref 24–36)

## 2023-09-29 LAB — SURGICAL PCR SCREEN
MRSA, PCR: NEGATIVE
Staphylococcus aureus: NEGATIVE

## 2023-09-29 LAB — PROTIME-INR
INR: 1.1 (ref 0.8–1.2)
Prothrombin Time: 14.4 s (ref 11.4–15.2)

## 2023-09-29 NOTE — Progress Notes (Signed)
 PCP - Lynwood Crandall, NP Cardiologist - Dr. Jennifer Crape (pt last saw in 2019) Pulmonologist- Lauraine Lites, NP  PPM/ICD - denies   Chest x-ray - 09/29/23 EKG - 09/29/23 Stress Test - 04/13/18 ECHO - denies Cardiac Cath - denies  Sleep Study - denies   DM- denies  Last dose of GLP1 agonist-  n/a   ASA/Blood Thinner Instructions: n/a   ERAS Protcol - no, NPO   COVID TEST- 09/29/23 (Pt states his grandson tested positive for COVID on 12/27. He was in close contact with him on 12/25). Pt currently has no symptoms. Bernardino Sprang, RN notified. Swab results pending.   Anesthesia review: yes, cardiac hx. Recent note from [place], [place].  Patient denies shortness of breath, fever, cough and chest pain at PAT appointment   All instructions explained to the patient, with a verbal understanding of the material. Patient agrees to go over the instructions while at home for a better understanding. Patient also instructed to wear a mask in public after being tested for COVID-19. The opportunity to ask questions was provided.

## 2023-09-30 DIAGNOSIS — R8279 Other abnormal findings on microbiological examination of urine: Secondary | ICD-10-CM | POA: Diagnosis not present

## 2023-09-30 DIAGNOSIS — C61 Malignant neoplasm of prostate: Secondary | ICD-10-CM | POA: Diagnosis not present

## 2023-09-30 LAB — SARS CORONAVIRUS 2 (TAT 6-24 HRS): SARS Coronavirus 2: NEGATIVE

## 2023-09-30 NOTE — Anesthesia Preprocedure Evaluation (Addendum)
 Anesthesia Evaluation  Patient identified by MRN, date of birth, ID band Patient awake    Reviewed: Allergy & Precautions, NPO status , Patient's Chart, lab work & pertinent test results  History of Anesthesia Complications (+) DIFFICULT AIRWAY and history of anesthetic complications  Airway Mallampati: II       Dental  (+) Missing,    Pulmonary COPD,  COPD inhaler, Current Smoker and Patient abstained from smoking.    + decreased breath sounds      Cardiovascular hypertension, Pt. on medications + CAD   Rhythm:Regular Rate:Normal  Stress Echo:   Nuclear stress EF: 55%.  Blood pressure demonstrated a hypertensive response to exercise.  There was no ST segment deviation noted during stress.  The study is normal.  This is a low risk study.  The left ventricular ejection fraction is normal (55-65%).    Neuro/Psych    GI/Hepatic negative GI ROS, Neg liver ROS,,,  Endo/Other  negative endocrine ROS    Renal/GU negative Renal ROS     Musculoskeletal negative musculoskeletal ROS (+)    Abdominal   Peds  Hematology negative hematology ROS (+)   Anesthesia Other Findings   Reproductive/Obstetrics                             Anesthesia Physical Anesthesia Plan  ASA: 3  Anesthesia Plan: General   Post-op Pain Management: Tylenol  PO (pre-op)*   Induction: Intravenous  PONV Risk Score and Plan: 2 and Ondansetron  and Dexamethasone   Airway Management Planned: Double Lumen EBT  Additional Equipment: CVP and Ultrasound Guidance Line Placement  Intra-op Plan:   Post-operative Plan: Extubation in OR  Informed Consent: I have reviewed the patients History and Physical, chart, labs and discussed the procedure including the risks, benefits and alternatives for the proposed anesthesia with the patient or authorized representative who has indicated his/her understanding and acceptance.      Dental advisory given  Plan Discussed with: CRNA  Anesthesia Plan Comments: (History of difficult airway due to immobile glottis and anterior larynx. Glidescope used 08/15/23. )        Anesthesia Quick Evaluation

## 2023-10-03 ENCOUNTER — Inpatient Hospital Stay (HOSPITAL_COMMUNITY): Payer: HMO | Admitting: Anesthesiology

## 2023-10-03 ENCOUNTER — Other Ambulatory Visit: Payer: Self-pay

## 2023-10-03 ENCOUNTER — Inpatient Hospital Stay (HOSPITAL_COMMUNITY)
Admission: RE | Admit: 2023-10-03 | Discharge: 2023-10-06 | DRG: 164 | Disposition: A | Discharge status: Home / Self Care | Payer: HMO | Attending: Thoracic Surgery (Cardiothoracic Vascular Surgery) | Admitting: Thoracic Surgery (Cardiothoracic Vascular Surgery)

## 2023-10-03 ENCOUNTER — Inpatient Hospital Stay (HOSPITAL_COMMUNITY): Payer: HMO

## 2023-10-03 ENCOUNTER — Encounter (HOSPITAL_COMMUNITY): Payer: Self-pay | Admitting: Thoracic Surgery (Cardiothoracic Vascular Surgery)

## 2023-10-03 ENCOUNTER — Inpatient Hospital Stay (HOSPITAL_COMMUNITY): Payer: Self-pay | Admitting: Physician Assistant

## 2023-10-03 ENCOUNTER — Encounter (HOSPITAL_COMMUNITY)
Admission: RE | Disposition: A | Discharge status: Home / Self Care | Payer: Self-pay | Source: Home / Self Care | Attending: Thoracic Surgery (Cardiothoracic Vascular Surgery)

## 2023-10-03 DIAGNOSIS — D72829 Elevated white blood cell count, unspecified: Secondary | ICD-10-CM | POA: Diagnosis not present

## 2023-10-03 DIAGNOSIS — Z6830 Body mass index (BMI) 30.0-30.9, adult: Secondary | ICD-10-CM

## 2023-10-03 DIAGNOSIS — Z902 Acquired absence of lung [part of]: Principal | ICD-10-CM

## 2023-10-03 DIAGNOSIS — Z83438 Family history of other disorder of lipoprotein metabolism and other lipidemia: Secondary | ICD-10-CM | POA: Diagnosis not present

## 2023-10-03 DIAGNOSIS — J449 Chronic obstructive pulmonary disease, unspecified: Secondary | ICD-10-CM | POA: Diagnosis present

## 2023-10-03 DIAGNOSIS — E66811 Obesity, class 1: Secondary | ICD-10-CM | POA: Diagnosis not present

## 2023-10-03 DIAGNOSIS — I2510 Atherosclerotic heart disease of native coronary artery without angina pectoris: Secondary | ICD-10-CM | POA: Diagnosis present

## 2023-10-03 DIAGNOSIS — I771 Stricture of artery: Secondary | ICD-10-CM | POA: Diagnosis not present

## 2023-10-03 DIAGNOSIS — R911 Solitary pulmonary nodule: Secondary | ICD-10-CM

## 2023-10-03 DIAGNOSIS — Z860101 Personal history of adenomatous and serrated colon polyps: Secondary | ICD-10-CM

## 2023-10-03 DIAGNOSIS — Z8249 Family history of ischemic heart disease and other diseases of the circulatory system: Secondary | ICD-10-CM

## 2023-10-03 DIAGNOSIS — R339 Retention of urine, unspecified: Secondary | ICD-10-CM | POA: Diagnosis not present

## 2023-10-03 DIAGNOSIS — Z4682 Encounter for fitting and adjustment of non-vascular catheter: Secondary | ICD-10-CM | POA: Diagnosis not present

## 2023-10-03 DIAGNOSIS — C3492 Malignant neoplasm of unspecified part of left bronchus or lung: Secondary | ICD-10-CM | POA: Diagnosis not present

## 2023-10-03 DIAGNOSIS — F17200 Nicotine dependence, unspecified, uncomplicated: Secondary | ICD-10-CM | POA: Diagnosis not present

## 2023-10-03 DIAGNOSIS — J95812 Postprocedural air leak: Secondary | ICD-10-CM | POA: Diagnosis not present

## 2023-10-03 DIAGNOSIS — Z825 Family history of asthma and other chronic lower respiratory diseases: Secondary | ICD-10-CM | POA: Diagnosis not present

## 2023-10-03 DIAGNOSIS — Z79899 Other long term (current) drug therapy: Secondary | ICD-10-CM | POA: Diagnosis not present

## 2023-10-03 DIAGNOSIS — F17210 Nicotine dependence, cigarettes, uncomplicated: Secondary | ICD-10-CM | POA: Diagnosis not present

## 2023-10-03 DIAGNOSIS — J9811 Atelectasis: Secondary | ICD-10-CM | POA: Diagnosis not present

## 2023-10-03 DIAGNOSIS — C3412 Malignant neoplasm of upper lobe, left bronchus or lung: Principal | ICD-10-CM | POA: Diagnosis present

## 2023-10-03 DIAGNOSIS — Z9049 Acquired absence of other specified parts of digestive tract: Secondary | ICD-10-CM

## 2023-10-03 DIAGNOSIS — I10 Essential (primary) hypertension: Secondary | ICD-10-CM | POA: Diagnosis not present

## 2023-10-03 DIAGNOSIS — I700 Atherosclerosis of aorta: Secondary | ICD-10-CM | POA: Diagnosis not present

## 2023-10-03 DIAGNOSIS — Z8546 Personal history of malignant neoplasm of prostate: Secondary | ICD-10-CM

## 2023-10-03 DIAGNOSIS — I4891 Unspecified atrial fibrillation: Secondary | ICD-10-CM | POA: Diagnosis not present

## 2023-10-03 DIAGNOSIS — Z9079 Acquired absence of other genital organ(s): Secondary | ICD-10-CM | POA: Diagnosis not present

## 2023-10-03 DIAGNOSIS — Z48813 Encounter for surgical aftercare following surgery on the respiratory system: Secondary | ICD-10-CM | POA: Diagnosis not present

## 2023-10-03 DIAGNOSIS — E785 Hyperlipidemia, unspecified: Secondary | ICD-10-CM | POA: Diagnosis not present

## 2023-10-03 DIAGNOSIS — Z7951 Long term (current) use of inhaled steroids: Secondary | ICD-10-CM

## 2023-10-03 DIAGNOSIS — T797XXA Traumatic subcutaneous emphysema, initial encounter: Secondary | ICD-10-CM | POA: Diagnosis not present

## 2023-10-03 DIAGNOSIS — J939 Pneumothorax, unspecified: Secondary | ICD-10-CM | POA: Diagnosis not present

## 2023-10-03 DIAGNOSIS — Z452 Encounter for adjustment and management of vascular access device: Secondary | ICD-10-CM | POA: Diagnosis not present

## 2023-10-03 HISTORY — PX: INTERCOSTAL NERVE BLOCK: SHX5021

## 2023-10-03 HISTORY — PX: LYMPH NODE BIOPSY: SHX201

## 2023-10-03 SURGERY — LOBECTOMY, LUNG, ROBOT-ASSISTED, USING VATS
Anesthesia: General | Site: Chest | Laterality: Left

## 2023-10-03 MED ORDER — HYDROMORPHONE HCL 1 MG/ML IJ SOLN: 0.2500 mg | INTRAMUSCULAR | Status: DC | PRN

## 2023-10-03 MED ORDER — BUPIVACAINE HCL (PF) 0.5 % IJ SOLN
INTRAMUSCULAR | Status: AC
  Filled 2023-10-03: qty 30

## 2023-10-03 MED ORDER — ROCURONIUM BROMIDE 10 MG/ML (PF) SYRINGE
PREFILLED_SYRINGE | INTRAVENOUS | Status: DC | PRN
  Administered 2023-10-03: 10 mg via INTRAVENOUS
  Administered 2023-10-03: 60 mg via INTRAVENOUS
  Administered 2023-10-03: 50 mg via INTRAVENOUS
  Administered 2023-10-03: 40 mg via INTRAVENOUS

## 2023-10-03 MED ORDER — PHENYLEPHRINE HCL-NACL 20-0.9 MG/250ML-% IV SOLN
INTRAVENOUS | Status: DC | PRN
  Administered 2023-10-03: 40 ug/min via INTRAVENOUS

## 2023-10-03 MED ORDER — CALCIUM CHLORIDE 10 % IV SOLN
INTRAVENOUS | Status: AC
  Filled 2023-10-03: qty 10

## 2023-10-03 MED ORDER — ATORVASTATIN CALCIUM 10 MG PO TABS
20.0000 mg | ORAL_TABLET | Freq: Every day | ORAL | Status: DC
  Administered 2023-10-04 – 2023-10-06 (×3): 20 mg via ORAL
  Filled 2023-10-03 (×3): qty 2

## 2023-10-03 MED ORDER — ACETAMINOPHEN 325 MG PO TABS: 325.0000 mg | ORAL_TABLET | Freq: Once | ORAL | Status: DC | PRN

## 2023-10-03 MED ORDER — SODIUM CHLORIDE (PF) 0.9 % IJ SOLN
INTRAMUSCULAR | Status: AC
  Filled 2023-10-03: qty 10

## 2023-10-03 MED ORDER — OXYCODONE HCL 5 MG PO TABS
5.0000 mg | ORAL_TABLET | ORAL | Status: DC | PRN
  Administered 2023-10-03: 5 mg via ORAL

## 2023-10-03 MED ORDER — CHLORHEXIDINE GLUCONATE CLOTH 2 % EX PADS: 6.0000 | MEDICATED_PAD | Freq: Every day | CUTANEOUS | Status: DC

## 2023-10-03 MED ORDER — OXYCODONE HCL 5 MG PO TABS
ORAL_TABLET | ORAL | Status: AC
  Filled 2023-10-03: qty 1

## 2023-10-03 MED ORDER — SUCCINYLCHOLINE CHLORIDE 200 MG/10ML IV SOSY
PREFILLED_SYRINGE | INTRAVENOUS | Status: DC | PRN
  Administered 2023-10-03: 160 mg via INTRAVENOUS

## 2023-10-03 MED ORDER — ACETAMINOPHEN 160 MG/5ML PO SOLN
325.0000 mg | Freq: Once | ORAL | Status: DC | PRN
Start: 2023-10-03 — End: 2023-10-03

## 2023-10-03 MED ORDER — ENOXAPARIN SODIUM 40 MG/0.4ML IJ SOSY
40.0000 mg | PREFILLED_SYRINGE | Freq: Every day | INTRAMUSCULAR | Status: DC
  Administered 2023-10-03 – 2023-10-05 (×3): 40 mg via SUBCUTANEOUS
  Filled 2023-10-03 (×3): qty 0.4

## 2023-10-03 MED ORDER — ACETAMINOPHEN 10 MG/ML IV SOLN
INTRAVENOUS | Status: DC | PRN
  Administered 2023-10-03: 1000 mg via INTRAVENOUS

## 2023-10-03 MED ORDER — ACETAMINOPHEN 10 MG/ML IV SOLN: 1000.0000 mg | Freq: Once | INTRAVENOUS | Status: DC | PRN

## 2023-10-03 MED ORDER — HYDROCHLOROTHIAZIDE 12.5 MG PO TABS
12.5000 mg | ORAL_TABLET | Freq: Every day | ORAL | Status: DC
  Administered 2023-10-04 – 2023-10-06 (×3): 12.5 mg via ORAL
  Filled 2023-10-03 (×4): qty 1

## 2023-10-03 MED ORDER — GABAPENTIN 300 MG PO CAPS
300.0000 mg | ORAL_CAPSULE | Freq: Every day | ORAL | Status: AC
  Administered 2023-10-03 – 2023-10-05 (×3): 300 mg via ORAL
  Filled 2023-10-03 (×3): qty 1

## 2023-10-03 MED ORDER — ARTIFICIAL TEARS OPHTHALMIC OINT
TOPICAL_OINTMENT | OPHTHALMIC | Status: DC | PRN
  Administered 2023-10-03: 1 via OPHTHALMIC

## 2023-10-03 MED ORDER — ONDANSETRON HCL 4 MG/2ML IJ SOLN: 4.0000 mg | Freq: Four times a day (QID) | INTRAMUSCULAR | Status: DC | PRN

## 2023-10-03 MED ORDER — DEXMEDETOMIDINE HCL IN NACL 80 MCG/20ML IV SOLN
INTRAVENOUS | Status: DC | PRN
  Administered 2023-10-03: 28 ug via INTRAVENOUS

## 2023-10-03 MED ORDER — HYDRALAZINE HCL 20 MG/ML IJ SOLN: 10.0000 mg | Freq: Four times a day (QID) | INTRAMUSCULAR | Status: DC | PRN

## 2023-10-03 MED ORDER — BISACODYL 5 MG PO TBEC
10.0000 mg | DELAYED_RELEASE_TABLET | Freq: Every day | ORAL | Status: DC
  Administered 2023-10-04 – 2023-10-06 (×3): 10 mg via ORAL
  Filled 2023-10-03 (×3): qty 2

## 2023-10-03 MED ORDER — FENTANYL CITRATE (PF) 250 MCG/5ML IJ SOLN
INTRAMUSCULAR | Status: AC
  Filled 2023-10-03: qty 5

## 2023-10-03 MED ORDER — CEFAZOLIN SODIUM-DEXTROSE 2-3 GM-%(50ML) IV SOLR
INTRAVENOUS | Status: DC | PRN
  Administered 2023-10-03: 2 g via INTRAVENOUS

## 2023-10-03 MED ORDER — ARTIFICIAL TEARS OPHTHALMIC OINT
TOPICAL_OINTMENT | OPHTHALMIC | Status: AC
  Filled 2023-10-03: qty 3.5

## 2023-10-03 MED ORDER — KETOROLAC TROMETHAMINE 15 MG/ML IJ SOLN
15.0000 mg | Freq: Four times a day (QID) | INTRAMUSCULAR | Status: DC
  Administered 2023-10-03 – 2023-10-04 (×3): 15 mg via INTRAVENOUS
  Filled 2023-10-03 (×3): qty 1

## 2023-10-03 MED ORDER — CHLORHEXIDINE GLUCONATE CLOTH 2 % EX PADS
6.0000 | MEDICATED_PAD | Freq: Every day | CUTANEOUS | Status: DC
  Administered 2023-10-03 – 2023-10-05 (×3): 6 via TOPICAL

## 2023-10-03 MED ORDER — CEFAZOLIN SODIUM-DEXTROSE 2-4 GM/100ML-% IV SOLN
2.0000 g | INTRAVENOUS | Status: DC
  Filled 2023-10-03: qty 100

## 2023-10-03 MED ORDER — PHENYLEPHRINE 80 MCG/ML (10ML) SYRINGE FOR IV PUSH (FOR BLOOD PRESSURE SUPPORT)
PREFILLED_SYRINGE | INTRAVENOUS | Status: AC
  Filled 2023-10-03: qty 10

## 2023-10-03 MED ORDER — GABAPENTIN 300 MG PO CAPS: 300.0000 mg | ORAL_CAPSULE | Freq: Two times a day (BID) | ORAL | Status: DC

## 2023-10-03 MED ORDER — EPHEDRINE SULFATE-NACL 50-0.9 MG/10ML-% IV SOSY
PREFILLED_SYRINGE | INTRAVENOUS | Status: DC | PRN
  Administered 2023-10-03: 15 mg via INTRAVENOUS

## 2023-10-03 MED ORDER — LIDOCAINE 2% (20 MG/ML) 5 ML SYRINGE
INTRAMUSCULAR | Status: DC | PRN
  Administered 2023-10-03: 100 mg via INTRAVENOUS

## 2023-10-03 MED ORDER — DROPERIDOL 2.5 MG/ML IJ SOLN: 0.6250 mg | Freq: Once | INTRAMUSCULAR | Status: DC | PRN

## 2023-10-03 MED ORDER — LIDOCAINE 2% (20 MG/ML) 5 ML SYRINGE
INTRAMUSCULAR | Status: AC
  Filled 2023-10-03: qty 5

## 2023-10-03 MED ORDER — KETOROLAC TROMETHAMINE 15 MG/ML IJ SOLN
INTRAMUSCULAR | Status: AC
  Filled 2023-10-03: qty 1

## 2023-10-03 MED ORDER — PANTOPRAZOLE SODIUM 40 MG PO TBEC
40.0000 mg | DELAYED_RELEASE_TABLET | Freq: Every day | ORAL | Status: DC
  Administered 2023-10-04 – 2023-10-06 (×3): 40 mg via ORAL
  Filled 2023-10-03 (×3): qty 1

## 2023-10-03 MED ORDER — ONDANSETRON HCL 4 MG/2ML IJ SOLN
INTRAMUSCULAR | Status: AC
  Filled 2023-10-03: qty 2

## 2023-10-03 MED ORDER — PHENYLEPHRINE 80 MCG/ML (10ML) SYRINGE FOR IV PUSH (FOR BLOOD PRESSURE SUPPORT)
PREFILLED_SYRINGE | INTRAVENOUS | Status: DC | PRN
  Administered 2023-10-03: 160 ug via INTRAVENOUS

## 2023-10-03 MED ORDER — VECURONIUM BROMIDE 10 MG IV SOLR
INTRAVENOUS | Status: AC
  Filled 2023-10-03: qty 10

## 2023-10-03 MED ORDER — LUNG SURGERY BOOK
Freq: Once | Status: AC
  Filled 2023-10-03: qty 1

## 2023-10-03 MED ORDER — LACTATED RINGERS IV SOLN: INTRAVENOUS | Status: AC

## 2023-10-03 MED ORDER — 0.9 % SODIUM CHLORIDE (POUR BTL) OPTIME
TOPICAL | Status: DC | PRN
  Administered 2023-10-03: 2000 mL

## 2023-10-03 MED ORDER — FLUTICASONE PROPIONATE 50 MCG/ACT NA SUSP
1.0000 | Freq: Every day | NASAL | Status: DC
  Filled 2023-10-03: qty 16

## 2023-10-03 MED ORDER — MEPERIDINE HCL 25 MG/ML IJ SOLN: 6.2500 mg | INTRAMUSCULAR | Status: DC | PRN

## 2023-10-03 MED ORDER — ACETAMINOPHEN 500 MG PO TABS
1000.0000 mg | ORAL_TABLET | Freq: Four times a day (QID) | ORAL | Status: DC
  Administered 2023-10-03 – 2023-10-05 (×8): 1000 mg via ORAL
  Filled 2023-10-03 (×8): qty 2

## 2023-10-03 MED ORDER — ACETAMINOPHEN 160 MG/5ML PO SOLN: 1000.0000 mg | Freq: Four times a day (QID) | ORAL | Status: DC

## 2023-10-03 MED ORDER — PROPOFOL 10 MG/ML IV BOLUS
INTRAVENOUS | Status: DC | PRN
  Administered 2023-10-03: 220 mg via INTRAVENOUS
  Administered 2023-10-03: 80 mg via INTRAVENOUS

## 2023-10-03 MED ORDER — TRAMADOL HCL 50 MG PO TABS: 50.0000 mg | ORAL_TABLET | Freq: Four times a day (QID) | ORAL | Status: DC | PRN

## 2023-10-03 MED ORDER — MOMETASONE FURO-FORMOTEROL FUM 200-5 MCG/ACT IN AERO
2.0000 | INHALATION_SPRAY | Freq: Two times a day (BID) | RESPIRATORY_TRACT | Status: DC
  Administered 2023-10-04 – 2023-10-06 (×4): 2 via RESPIRATORY_TRACT
  Filled 2023-10-03 (×3): qty 8.8

## 2023-10-03 MED ORDER — ONDANSETRON HCL 4 MG/2ML IJ SOLN
INTRAMUSCULAR | Status: DC | PRN
  Administered 2023-10-03: 4 mg via INTRAVENOUS

## 2023-10-03 MED ORDER — BUPIVACAINE LIPOSOME 1.3 % IJ SUSP
INTRAMUSCULAR | Status: AC
  Filled 2023-10-03: qty 20

## 2023-10-03 MED ORDER — ROCURONIUM BROMIDE 10 MG/ML (PF) SYRINGE
PREFILLED_SYRINGE | INTRAVENOUS | Status: AC
  Filled 2023-10-03: qty 10

## 2023-10-03 MED ORDER — LACTATED RINGERS IV SOLN: INTRAVENOUS | Status: DC | PRN

## 2023-10-03 MED ORDER — DEXAMETHASONE SODIUM PHOSPHATE 10 MG/ML IJ SOLN
INTRAMUSCULAR | Status: AC
  Filled 2023-10-03: qty 1

## 2023-10-03 MED ORDER — DEXAMETHASONE SODIUM PHOSPHATE 10 MG/ML IJ SOLN
INTRAMUSCULAR | Status: DC | PRN
  Administered 2023-10-03: 10 mg via INTRAVENOUS

## 2023-10-03 MED ORDER — EPHEDRINE 5 MG/ML INJ
INTRAVENOUS | Status: AC
  Filled 2023-10-03: qty 5

## 2023-10-03 MED ORDER — ORAL CARE MOUTH RINSE: 15.0000 mL | Freq: Once | OROMUCOSAL | Status: AC

## 2023-10-03 MED ORDER — PROPOFOL 10 MG/ML IV BOLUS
INTRAVENOUS | Status: AC
  Filled 2023-10-03: qty 20

## 2023-10-03 MED ORDER — ACETAMINOPHEN 10 MG/ML IV SOLN
INTRAVENOUS | Status: AC
  Filled 2023-10-03: qty 100

## 2023-10-03 MED ORDER — SENNOSIDES-DOCUSATE SODIUM 8.6-50 MG PO TABS
1.0000 | ORAL_TABLET | Freq: Every day | ORAL | Status: DC
  Administered 2023-10-03 – 2023-10-05 (×3): 1 via ORAL
  Filled 2023-10-03 (×3): qty 1

## 2023-10-03 MED ORDER — CEFAZOLIN SODIUM-DEXTROSE 2-4 GM/100ML-% IV SOLN
2.0000 g | Freq: Three times a day (TID) | INTRAVENOUS | Status: AC
  Administered 2023-10-03 – 2023-10-04 (×2): 2 g via INTRAVENOUS
  Filled 2023-10-03 (×2): qty 100

## 2023-10-03 MED ORDER — SUGAMMADEX SODIUM 200 MG/2ML IV SOLN
INTRAVENOUS | Status: DC | PRN
  Administered 2023-10-03: 200 mg via INTRAVENOUS

## 2023-10-03 MED ORDER — LACTATED RINGERS IV SOLN: INTRAVENOUS | Status: DC

## 2023-10-03 MED ORDER — SODIUM CHLORIDE FLUSH 0.9 % IV SOLN
INTRAVENOUS | Status: DC | PRN
  Administered 2023-10-03: 100 mL

## 2023-10-03 MED ORDER — CHLORHEXIDINE GLUCONATE 0.12 % MT SOLN
15.0000 mL | Freq: Once | OROMUCOSAL | Status: AC
Start: 2023-10-03 — End: 2023-10-03
  Administered 2023-10-03: 15 mL via OROMUCOSAL
  Filled 2023-10-03: qty 15

## 2023-10-03 MED ORDER — MORPHINE SULFATE (PF) 2 MG/ML IV SOLN: 1.0000 mg | INTRAVENOUS | Status: DC | PRN

## 2023-10-03 MED ORDER — SODIUM CHLORIDE 0.9% IV SOLUTION
INTRAVENOUS | Status: AC | PRN
  Administered 2023-10-03: 1000 mL

## 2023-10-03 MED ORDER — FENTANYL CITRATE (PF) 250 MCG/5ML IJ SOLN
INTRAMUSCULAR | Status: DC | PRN
  Administered 2023-10-03 (×7): 50 ug via INTRAVENOUS

## 2023-10-03 MED ORDER — CALCIUM CHLORIDE 10 % IV SOLN
INTRAVENOUS | Status: DC | PRN
Start: 2023-10-03 — End: 2023-10-03
  Administered 2023-10-03: 200 mg via INTRAVENOUS
  Administered 2023-10-03: 400 mg via INTRAVENOUS

## 2023-10-03 MED ORDER — SODIUM CHLORIDE 0.45 % IV SOLN: INTRAVENOUS | Status: DC

## 2023-10-03 MED ORDER — ALBUMIN HUMAN 5 % IV SOLN: INTRAVENOUS | Status: DC | PRN

## 2023-10-03 SURGICAL SUPPLY — 65 items
BLADE CLIPPER SURG (BLADE) IMPLANT
CANISTER SUCT 3000ML PPV (MISCELLANEOUS) ×2 IMPLANT
CANNULA REDUCER 12-8 DVNC XI (CANNULA) ×2 IMPLANT
CNTNR URN SCR LID CUP LEK RST (MISCELLANEOUS) ×5 IMPLANT
DEFOGGER SCOPE WARMER CLEARIFY (MISCELLANEOUS) ×1 IMPLANT
DERMABOND ADVANCED .7 DNX12 (GAUZE/BANDAGES/DRESSINGS) ×1 IMPLANT
DRAPE ARM DVNC X/XI (DISPOSABLE) ×4 IMPLANT
DRAPE COLUMN DVNC XI (DISPOSABLE) ×1 IMPLANT
DRAPE CV SPLIT W-CLR ANES SCRN (DRAPES) ×1 IMPLANT
DRAPE SURG ORHT 6 SPLT 77X108 (DRAPES) ×1 IMPLANT
DRAPE WARM FLUID 44X44 (DRAPES) IMPLANT
ELECT BLADE 6.5 EXT (BLADE) ×1 IMPLANT
ELECT REM PT RETURN 9FT ADLT (ELECTROSURGICAL) ×1
ELECTRODE REM PT RTRN 9FT ADLT (ELECTROSURGICAL) ×1 IMPLANT
FORCEPS BPLR FENES DVNC XI (FORCEP) IMPLANT
FORCEPS BPLR LNG DVNC XI (INSTRUMENTS) IMPLANT
GAUZE KITTNER 4X5 RF (MISCELLANEOUS) ×2 IMPLANT
GAUZE SPONGE 4X4 12PLY STRL (GAUZE/BANDAGES/DRESSINGS) ×1 IMPLANT
GLOVE SS BIOGEL STRL SZ 7.5 (GLOVE) ×2 IMPLANT
GLOVE SURG POLYISO LF SZ8 (GLOVE) ×1 IMPLANT
GOWN STRL REUS W/ TWL LRG LVL3 (GOWN DISPOSABLE) ×2 IMPLANT
GOWN STRL REUS W/ TWL XL LVL3 (GOWN DISPOSABLE) ×2 IMPLANT
GOWN STRL REUS W/TWL 2XL LVL3 (GOWN DISPOSABLE) ×1 IMPLANT
GRASPER TIP-UP FEN DVNC XI (INSTRUMENTS) IMPLANT
HEMOSTAT SURGICEL 2X14 (HEMOSTASIS) ×3 IMPLANT
IRRIGATION STRYKERFLOW (MISCELLANEOUS) ×1 IMPLANT
IRRIGATOR STRYKERFLOW (MISCELLANEOUS) ×1
KIT BASIN OR (CUSTOM PROCEDURE TRAY) ×1 IMPLANT
KIT SUCTION CATH 14FR (SUCTIONS) IMPLANT
KIT TURNOVER KIT B (KITS) ×1 IMPLANT
NDL HYPO 25GX1X1/2 BEV (NEEDLE) ×1 IMPLANT
NDL SPNL 22GX3.5 QUINCKE BK (NEEDLE) ×1 IMPLANT
NEEDLE HYPO 25GX1X1/2 BEV (NEEDLE) ×1
NEEDLE SPNL 22GX3.5 QUINCKE BK (NEEDLE) ×1
NS IRRIG 1000ML POUR BTL (IV SOLUTION) ×2 IMPLANT
PACK CHEST (CUSTOM PROCEDURE TRAY) ×1 IMPLANT
PAD ARMBOARD 7.5X6 YLW CONV (MISCELLANEOUS) ×2 IMPLANT
PORT ACCESS TROCAR AIRSEAL 12 (TROCAR) ×1 IMPLANT
RELOAD STAPLE 45 2.5 WHT DVNC (STAPLE) IMPLANT
RELOAD STAPLE 45 3.5 BLU DVNC (STAPLE) IMPLANT
RELOAD STAPLE 45 4.3 GRN DVNC (STAPLE) IMPLANT
SEAL UNIV 5-12 XI (MISCELLANEOUS) ×4 IMPLANT
SEALER SYNCHRO 8 IS4000 DVNC (MISCELLANEOUS) IMPLANT
SET TRI-LUMEN FLTR TB AIRSEAL (TUBING) ×1 IMPLANT
SOL ELECTROSURG ANTI STICK (MISCELLANEOUS) ×1
SOLUTION ELECTROSURG ANTI STCK (MISCELLANEOUS) ×1 IMPLANT
SPONGE TONSIL 1.25 RF SGL STRG (GAUZE/BANDAGES/DRESSINGS) IMPLANT
STAPLER 45 SUREFORM CVD DVNC (STAPLE) IMPLANT
STAPLER RELOAD 2.5X45 WHT DVNC (STAPLE) ×6
STAPLER RELOAD 3.5X45 BLU DVNC (STAPLE) ×2
STAPLER RELOAD 4.3X45 GRN DVNC (STAPLE) ×1
SUT SILK 1 MH (SUTURE) ×1 IMPLANT
SUT SILK 2 0 SH (SUTURE) ×1 IMPLANT
SUT VIC AB 1 CTX36XBRD ANBCTR (SUTURE) ×1 IMPLANT
SUT VIC AB 2-0 CTX 36 (SUTURE) ×1 IMPLANT
SUT VIC AB 3-0 X1 27 (SUTURE) ×2 IMPLANT
SUT VICRYL 0 TIES 12 18 (SUTURE) ×1 IMPLANT
SUT VICRYL 0 UR6 27IN ABS (SUTURE) ×2 IMPLANT
SYR 20CC LL (SYRINGE) ×2 IMPLANT
SYSTEM RETRIEVAL ANCHOR 15 (MISCELLANEOUS) IMPLANT
SYSTEM SAHARA CHEST DRAIN ATS (WOUND CARE) ×1 IMPLANT
TAPE CLOTH 4X10 WHT NS (GAUZE/BANDAGES/DRESSINGS) ×1 IMPLANT
TOWEL GREEN STERILE (TOWEL DISPOSABLE) ×1 IMPLANT
TRAY FOLEY MTR SLVR 16FR STAT (SET/KITS/TRAYS/PACK) ×1 IMPLANT
WATER STERILE IRR 1000ML POUR (IV SOLUTION) ×2 IMPLANT

## 2023-10-03 NOTE — Interval H&P Note (Signed)
 History and Physical Interval Note:  10/03/2023 7:17 AM  Cameron [name]  has presented today for surgery, with the diagnosis of SQUAMOUS CELL CARCINOMA.  The various methods of treatment have been discussed with the patient and family. After consideration of risks, benefits and other options for treatment, the patient has consented to  Procedure(s): XI ROBOTIC ASSISTED THORACOSCOPY-LEFT PNEUMONECTOMY OR LOBECTOMY (Left) as a surgical intervention.  The patient's history has been reviewed, patient examined, no change in status, stable for surgery.  I have reviewed the patient's chart and labs.  Questions were answered to the patient's satisfaction.     Elspeth JAYSON Millers

## 2023-10-03 NOTE — Brief Op Note (Addendum)
 10/03/2023  11:14 AM  PATIENT:  Cameron [name]  73 y.o. male  PRE-OPERATIVE DIAGNOSIS:  SQUAMOUS CELL CARCINOMA- CLINICAL STAGE 1A (T1,N0)  POST-OPERATIVE DIAGNOSIS:  SQUAMOUS CELL CARCINOMA-  CLINICAL STAGE 1A (T1,N0)  PROCEDURE:  Procedure(s):  XI ROBOTIC ASSISTED THORACOSCOPY- LEFT UPPER LOBECTOMY (Left) LYMPH NODE DISSECTION (Left) INTERCOSTAL NERVE BLOCKS (Left)   SURGEON:  Surgeons and Role:    * Kerrin Elspeth BROCKS, MD - Primary  PHYSICIAN ASSISTANT: Rocky Shad PA-C  ASSISTANTS: Warren Pouch CST   ANESTHESIA:   general  EBL:  200 mL   BLOOD ADMINISTERED:none  DRAINS:  19 Straight Chest Tube Left Chest    LOCAL MEDICATIONS USED:  BUPIVICAINE   SPECIMEN:  Source of Specimen:  Left Upper Lobe, Lymph Nodes  DISPOSITION OF SPECIMEN:  PATHOLOGY  COUNTS:  YES  TOURNIQUET:  * No tourniquets in log *  DICTATION: .done  PLAN OF CARE: Admit to inpatient   PATIENT DISPOSITION:  PACU - hemodynamically stable.   Delay start of Pharmacological VTE agent (>24hrs) due to surgical blood loss or risk of bleeding: no

## 2023-10-03 NOTE — Plan of Care (Signed)
  Problem: Education: Goal: Knowledge of disease or condition will improve Outcome: Progressing   Problem: Clinical Measurements: Goal: Postoperative complications will be avoided or minimized Outcome: Progressing   Problem: Pain Management: Goal: Pain level will decrease Outcome: Progressing   Problem: Education: Goal: Knowledge of General Education information will improve Description: Including pain rating scale, medication(s)/side effects and non-pharmacologic comfort measures Outcome: Progressing   Problem: Pain Management: Goal: General experience of comfort will improve Outcome: Progressing   Problem: Safety: Goal: Ability to remain free from injury will improve Outcome: Progressing

## 2023-10-03 NOTE — Plan of Care (Signed)
  Problem: Education: Goal: Knowledge of disease or condition will improve Outcome: Progressing Goal: Knowledge of the prescribed therapeutic regimen will improve Outcome: Progressing   Problem: Activity: Goal: Risk for activity intolerance will decrease Outcome: Progressing   Problem: Cardiac: Goal: Will achieve and/or maintain hemodynamic stability Outcome: Progressing   Problem: Clinical Measurements: Goal: Postoperative complications will be avoided or minimized Outcome: Progressing   Problem: Respiratory: Goal: Respiratory status will improve Outcome: Progressing   Problem: Pain Management: Goal: Pain level will decrease Outcome: Progressing   Problem: Skin Integrity: Goal: Wound healing without signs and symptoms infection will improve Outcome: Progressing   Problem: Education: Goal: Knowledge of General Education information will improve Description: Including pain rating scale, medication(s)/side effects and non-pharmacologic comfort measures Outcome: Progressing   Problem: Clinical Measurements: Goal: Ability to maintain clinical measurements within normal limits will improve Outcome: Progressing Goal: Will remain free from infection Outcome: Progressing Goal: Diagnostic test results will improve Outcome: Progressing Goal: Respiratory complications will improve Outcome: Progressing Goal: Cardiovascular complication will be avoided Outcome: Progressing   Problem: Activity: Goal: Risk for activity intolerance will decrease Outcome: Progressing   Problem: Nutrition: Goal: Adequate nutrition will be maintained Outcome: Progressing   Problem: Coping: Goal: Level of anxiety will decrease Outcome: Progressing   Problem: Elimination: Goal: Will not experience complications related to bowel motility Outcome: Progressing Goal: Will not experience complications related to urinary retention Outcome: Progressing   Problem: Pain Management: Goal: General  experience of comfort will improve Outcome: Progressing   Problem: Safety: Goal: Ability to remain free from injury will improve Outcome: Progressing

## 2023-10-03 NOTE — Anesthesia Procedure Notes (Addendum)
 Procedure Name: Intubation Date/Time: 10/03/2023 7:58 AM  Performed by: Oley Aleck LABOR, CRNAPre-anesthesia Checklist: Patient identified, Emergency Drugs available, Suction available and Patient being monitored Patient Re-evaluated:Patient Re-evaluated prior to induction Oxygen Delivery Method: Circle system utilized Preoxygenation: Pre-oxygenation with 100% oxygen Induction Type: IV induction Ventilation: Two handed mask ventilation required and Oral airway inserted - appropriate to patient size Laryngoscope Size: Glidescope and 4 Grade View: Grade I Tube type: Oral Endobronchial tube: Left, EBT position confirmed by auscultation, EBT position confirmed by fiberoptic bronchoscope and Double lumen EBT and 41 Fr Number of attempts: 2 Airway Equipment and Method: Stylet, Fiberoptic brochoscope, Bougie stylet, Video-laryngoscopy and Oral airway Placement Confirmation: ETT inserted through vocal cords under direct vision, positive ETCO2 and breath sounds checked- equal and bilateral Tube secured with: Tape Dental Injury: Teeth and Oropharynx as per pre-operative assessment  Difficulty Due To: Difficulty was anticipated, Difficult Airway- due to anterior larynx and Difficult Airway- due to large tongue Future Recommendations: Recommend- induction with short-acting agent, and alternative techniques readily available Comments: Airway difficult r/t small glottic opening, large tongue and anterior airway; glide 4 used with epiglottis held out of view by blade.  Single ETT placed, exchanged with cook catheter for DLT; DLT position confirmed with fiberoptic bronchoscope. EBBS confirmed after final lateral positioning

## 2023-10-03 NOTE — Anesthesia Procedure Notes (Signed)
 Central Venous Catheter Insertion Performed by: Tilford Franky BIRCH, MD, anesthesiologist Start/End1/02/2024 6:45 AM, 10/03/2023 6:55 AM Patient location: Pre-op. Preanesthetic checklist: patient identified, IV checked, site marked, risks and benefits discussed, surgical consent, monitors and equipment checked, pre-op evaluation, timeout performed and anesthesia consent Position: Trendelenburg Lidocaine  1% used for infiltration and patient sedated Hand hygiene performed , maximum sterile barriers used  and Seldinger technique used Catheter size: 8 Fr Total catheter length 16. Central line was placed.Double lumen Procedure performed using ultrasound guided technique. Ultrasound Notes:anatomy identified, needle tip was noted to be adjacent to the nerve/plexus identified, no ultrasound evidence of intravascular and/or intraneural injection and image(s) printed for medical record Attempts: 1 Following insertion, dressing applied, line sutured and Biopatch. Post procedure assessment: blood return through all ports  Patient tolerated the procedure well with no immediate complications.

## 2023-10-03 NOTE — Hospital Course (Addendum)
 History of Present Illness:  Cameron [name] is a 73 year old smoker with a history of COPD, hypertension, hyperlipidemia, obesity, coronary calcification, prostate cancer status post TURP, colon polyp, and squamous of carcinoma of Cameron skin.  Cameron [name] has smoked about a pack to a pack and a half a day for 50 years.  Currently smoking three fourths of a pack per day.   Cameron [name] had a low-dose CT for lung cancer screening in October.  It showed a left hilar fullness.  That was followed by a CT of Cameron chest with contrast.  Which showed approximately 1.7 cm hilar mass in Cameron left upper lobe.  PET/CT showed Cameron mass was hypermetabolic.  Cameron [name] underwent navigational bronchoscopy by Dr. Brenna.  Cameron [name] noted a an endobronchial mass in Cameron left upper lobe apical segmental bronchus.  Biopsy showed squamous cell carcinoma.  Cameron [name] was referred to [facility] for surgical resection. Cameron [name] was evaluated by Dr. 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  Currently only working when Cameron [name] wants to, but does not have any physical limitations when Cameron [name] does so.  Can walk up 2 flights of stairs without difficulty.  Denies any chest pain, pressure, tightness, or shortness of breath.  No change in appetite or unintentional weight loss.  Cameron [name] was instructed to quit smoking.  It was also felt Cameron [name] should undergo Robotic Assisted Left Video Thoracoscopy with left upper lobectomy vs pneumonectomy.  Cameron risks and benefits of Cameron procedure were explained to Cameron [name] and Cameron [name] was agreeable to proceed.  Hospital Course:  Cameron [name] presented to [facility] on 10/03/2023.  Cameron [name] was taken to Cameron operating room and underwent Robotic Assisted Left Video Assisted Thoracoscopy with Left Upper Lobectomy, lymph node dissection and intercostal nerve block.  Cameron [name] tolerated Cameron procedure without difficulty, was extubated, and taken to Cameron PACU in stable condition.  Cameron  [name] has done well post operatively.  His pain has been very well controlled.  Cameron [name] was hypertensive and his home medications were resumed.  His chest tube had an air leak post operatively.  Chest xray showed a left apical space.  Cameron [name] developed urinary retention initially post foley removal.  Cameron [name] did not require I/O cath and this resolved without further issue.  Cameron [name]'s air leak resolved with some tidaling present.  His chest tube was clamped with repeat CXR remaining stable.  His chest tube was removed on 10/05/2023.  Post chest tube removal chest x ray showed tiny apical space.  Cameron [name] is ambulating independently.  His surgical incisions are free of infection.  Cameron [name] is medically stable for discharge home today.

## 2023-10-03 NOTE — Anesthesia Postprocedure Evaluation (Signed)
 Anesthesia Post Note  Patient: Oneil LABOR Salone  Procedure(s) Performed: XI ROBOTIC ASSISTED THORACOSCOPY-LEFT UPPER LOBECTOMY (Left: Chest) INTERCOSTAL NERVE BLOCK (Left: Chest) LYMPH NODE BIOPSY (Left: Chest)     Patient location during evaluation: PACU Anesthesia Type: General Level of consciousness: awake and alert Pain management: pain level controlled Vital Signs Assessment: post-procedure vital signs reviewed and stable Respiratory status: spontaneous breathing, nonlabored ventilation, respiratory function stable and patient connected to nasal cannula oxygen Cardiovascular status: blood pressure returned to baseline and stable Postop Assessment: no apparent nausea or vomiting Anesthetic complications: yes  Encounter Notable Events  Notable Event Outcome Phase Comment  Difficult to intubate - expected Resolved in Lab Intraprocedure Filed from anesthesia note documentation.    Last Vitals:  Vitals:   10/03/23 1315 10/03/23 1330  BP:  (!) 141/80  Pulse: 70 67  Resp: 11 (!) 22  Temp:    SpO2: 94% 94%    Last Pain:  Vitals:   10/03/23 1245  TempSrc:   PainSc: 0-No pain                 Franky JONETTA Bald

## 2023-10-03 NOTE — Anesthesia Procedure Notes (Addendum)
 Arterial Line Insertion Start/End1/02/2024 7:10 AM, 10/03/2023 7:30 AM Performed by: Oley Aleck LABOR, CRNA, CRNA  Patient location: Pre-op. Preanesthetic checklist: patient identified, IV checked, site marked, risks and benefits discussed, surgical consent, monitors and equipment checked, pre-op evaluation, timeout performed and anesthesia consent Lidocaine  1% used for infiltration and patient sedated Right, radial was placed Catheter size: 18 G Hand hygiene performed , maximum sterile barriers used  and Seldinger technique used  Attempts: 4 Procedure performed using ultrasound guided technique. Ultrasound Notes:anatomy identified, needle tip was noted to be adjacent to the nerve/plexus identified and no ultrasound evidence of intravascular and/or intraneural injection Following insertion, dressing applied and Biopatch. Post procedure assessment: normal  Patient tolerated the procedure well with no immediate complications. Additional procedure comments: First 2 attempts by NORVA Domino .

## 2023-10-03 NOTE — Transfer of Care (Signed)
 Immediate Anesthesia Transfer of Care Note  Patient: Oneil LABOR Bordeau  Procedure(s) Performed: XI ROBOTIC ASSISTED THORACOSCOPY-LEFT UPPER LOBECTOMY (Left: Chest) INTERCOSTAL NERVE BLOCK (Left: Chest) LYMPH NODE BIOPSY (Left: Chest)  Patient Location: PACU  Anesthesia Type:General  Level of Consciousness: awake, drowsy, patient cooperative, and responds to stimulation  Airway & Oxygen Therapy: Patient Spontanous Breathing and Patient connected to face mask oxygen  Post-op Assessment: Report given to RN and Post -op Vital signs reviewed and stable  Post vital signs: Reviewed and stable  Last Vitals:  Vitals Value Taken Time  BP 148/94 10/03/23 1137  Temp 98   Pulse 76 10/03/23 1140  Resp 16 10/03/23 1140  SpO2 95 % 10/03/23 1140  Vitals shown include unfiled device data.  Pt denies pain   Patients Stated Pain Goal: 0 (10/03/23 9383)  Complications:  Encounter Notable Events  Notable Event Outcome Phase Comment  Difficult to intubate - expected  Intraprocedure Filed from anesthesia note documentation.

## 2023-10-03 NOTE — Op Note (Signed)
 NAME: Cameron Cameron [name], Cameron Cameron [name]. MEDICAL RECORD NO: [id number] ACCOUNT NO: [id number] DATE OF BIRTH: [birth year] FACILITY: [facility] LOCATION: [facility] PHYSICIAN: Elspeth BROCKS. Kerrin, MD  Operative Report   DATE OF PROCEDURE: 10/03/2023  PREOPERATIVE DIAGNOSIS:  Squamous cell carcinoma, left upper lobe, clinical stage IA (T1, N0).  POSTOPERATIVE DIAGNOSIS:  Squamous cell carcinoma, left upper lobe, clinical stage IA (T1, N0).  PROCEDURE:   Xi-Roboic assisted left upper lobectomy,  Lymph node dissection and  Intercostal nerve blocks levels 3 through 10.  SURGEON:  Elspeth BROCKS. Kerrin, MD  ASSISTANT:  Rocky Shad, PA  Experienced assistance was necessary for this case due to surgical complexity.  Erin Barrett assisted with port placement, camera management, robot docking, and undocking, instrument exchange, specimen retrieval, suctioning, and wound closure.  ANESTHESIA:  General.  FINDINGS:  Level 7 node and pleural plaque negative for tumor.  Bronchial margin free of tumor.  CLINICAL NOTE:  Cameron Cameron [name] is Cameron [name] 73 year old man with Cameron [name] history of tobacco abuse who recently was found to have Cameron [name] left hilar mass on Cameron [name] low-dose CT for lung cancer screening.  Dr. Brenna performed Cameron [name] bronchoscopy and found an endobronchial mass in the left upper lobe apical segmental bronchus.  Biopsy showed squamous cell carcinoma.  There was no evidence of regional or distant metastatic disease.  He was offered surgical resection with Cameron [name] plan to attempt lobectomy, but he understood that Cameron [name] pneumonectomy might be necessary.  The indications, risks, benefits, and alternatives were discussed in detail with the patient.  He understood and accepted the risks and agreed to proceed.  OPERATIVE NOTE:  Cameron Cameron [name] was brought to the operating room on 10/03/2023.  He had induction of general anesthesia and was intubated with Cameron [name] double-lumen endotracheal tube.  Flexible fiberoptic bronchoscopy was performed via the endotracheal tube.  It revealed  an endobronchial mass lesion in the apical segment of the left upper lobe.  The left upper lobe bronchus proper did not have any endobronchial tumor.  Cameron [name] Foley catheter was placed.  Intravenous antibiotics were administered.  Sequential compression devices were placed on the calves for DVT prophylaxis.  He was placed in Cameron [name] right lateral decubitus position.  Cameron [name] Bair Hugger was placed for active warming.  The left chest was prepped and draped in the usual sterile fashion.  Single-lung ventilation of the right lung was initiated and was tolerated well throughout the procedure.  Cameron [name] timeout was performed.  Cameron [name] solution containing 20 mL of liposomal bupivacaine , 30 mL of 0.5% bupivacaine , and 50 mL of saline was prepared.  This solution was used for local at the incisions and for the intercostal nerve blocks.  An incision was made in the eighth interspace in the midaxillary line and an 8-mm robotic port was inserted.  The thoracoscope was advanced into the chest.  After confirming intrapleural placement, carbon dioxide was insufflated per protocol.  Cameron [name] 12-mm robotic port was placed in the eighth interspace anterior to the camera port.  Intercostal nerve blocks then were performed from the third to the 10th interspace by injecting 10 mL of the bupivacaine  solution into Cameron [name] subpleural plane at each level.  Of note, there was significant benign-appearing pleural plaque on the parietal pleura.  Cameron [name] 12-mm AirSeal port was placed in the 10th interspace posterolaterally.  Two additional eighth interspace robotic ports were placed.  The robot was deployed.  The camera arm was  docked.  Targeting was performed.  The remaining arms were docked.  The robotic instruments were inserted with thoracoscopic visualization.  The lung was retracted superiorly and the inferior pulmonary ligament was divided with bipolar cautery.  All of the dissection was done using bipolar cautery.  Each lymph node that was encountered was removed and sent as Cameron [name]  separate specimen for permanent pathology with the exception of the level 7 node, which was sent for frozen.  Cameron [name] level 9 node was removed.  The pleural reflection then was divided at the hilum posteriorly and Cameron [name] level 7 node was removed.  This node appeared Cameron [name] little more granular than normal.  It had Cameron [name] slightly abnormal appearance, so it was sent for frozen section, but that subsequently returned with no tumor seen.  The pleura was dissected off the pulmonary artery into the fissure.  An initial dissection was done on the posterior arterial branch.  Cameron [name] biopsy was taken to the parietal pleura and sent for frozen section, which returned showing benign fibrotic tissue.  The lung then was retracted inferiorly.  The pleural reflection was divided at the hilum superiorly.  Level 5 node was removed.  The phrenic nerve was in relatively close proximity to the level 5 node and care was taken in that area with cautery not to do so in the vicinity of the nerve.  Additional level 10 nodes were removed in that area as well.  Anteriorly, the  pleural reflection was divided at the hilum again identifying the phrenic nerve throughout its course and the initial dissection was done on the superior pulmonary vein.  At this point, it still was not clear if this would be Cameron [name] lobectomy or pneumonectomy.  The fissure was nearly complete.  The pleura overlying the pulmonary artery was incised with bipolar cautery and the plane was developed along the pulmonary artery.  Cameron [name] small portion of the fissure was divided with Cameron [name] stapler posteriorly.   Additional level 12 nodes were removed.  Working anteriorly, Cameron [name] level 11 node was removed and then Cameron [name] second larger but otherwise benign-appearing node was removed from the level 11 space as well.  Stapling was not necessary anteriorly.  The left upper lobe was dissected off the pulmonary artery.  There was no invasion of the pulmonary artery.  The posterior branch was dissected, encircled, and divided with  the robotic stapler.  Attention was then turned anteriorly.  The superior pulmonary vein was encircled and divided with Cameron [name] robotic stapler.  Dissection on the anterior and apical branches and the lingual branches was carried out both from an anterior and posterior approach.  These vessels were all divided sequentially using the robotic stapler.  The stapler then was placed across the left upper lobe bronchus at its origin and closed.  Cameron [name] test inflation showed good aeration of the lower lobe and the stapler was fired transecting the left upper lobe bronchus.  The chest was copiously irrigated with saline.  Test inflation to 30 cm of water  pressure revealed no air leakage from the dissection or the bronchial stump.  The vessel loop and sponges used during the procedure were removed.  The robotic instruments were removed.  The robot was undocked.  The anterior eighth interspace incision was lengthened to approximately 3 cm.  The upper lobe was manipulated into Cameron [name] 15-mm endoscopic retrieval bag and then was removed and sent for frozen section of the bronchial margin, which returned with no tumor seen.   The port sites and staple lines were inspected for hemostasis.  Cameron [name] 28-French chest tube was placed through the original port incision and secured with Cameron [name] #  1 silk suture.  Dual-lung ventilation was resumed.  The remaining incisions were closed in standard fashion.  Dermabond was applied.  The chest tube was placed to Cameron [name] Pleura-Evac on water  seal.  The patient was placed back in the supine position.  He was extubated in the operating room and taken to the post-anesthetic care unit in good condition.  All sponge, needle, and instrument counts were correct at the end of the procedure.   PUS D: 10/03/2023 4:05:50 pm T: 10/03/2023 4:38:00 pm  JOB: [id number]/ [id number]

## 2023-10-04 ENCOUNTER — Inpatient Hospital Stay (HOSPITAL_COMMUNITY): Payer: HMO

## 2023-10-04 ENCOUNTER — Encounter (HOSPITAL_COMMUNITY): Payer: Self-pay | Admitting: Thoracic Surgery (Cardiothoracic Vascular Surgery)

## 2023-10-04 LAB — CBC
HCT: 39.4 % (ref 39.0–52.0)
Hemoglobin: 13.6 g/dL (ref 13.0–17.0)
MCH: 32 pg (ref 26.0–34.0)
MCHC: 34.5 g/dL (ref 30.0–36.0)
MCV: 92.7 fL (ref 80.0–100.0)
Platelets: 200 10*3/uL (ref 150–400)
RBC: 4.25 MIL/uL (ref 4.22–5.81)
RDW: 13.3 % (ref 11.5–15.5)
WBC: 14.1 10*3/uL — ABNORMAL HIGH (ref 4.0–10.5)
nRBC: 0 % (ref 0.0–0.2)

## 2023-10-04 LAB — BASIC METABOLIC PANEL
Anion gap: 10 (ref 5–15)
BUN: 15 mg/dL (ref 8–23)
CO2: 25 mmol/L (ref 22–32)
Calcium: 8.8 mg/dL — ABNORMAL LOW (ref 8.9–10.3)
Chloride: 101 mmol/L (ref 98–111)
Creatinine, Ser: 1.03 mg/dL (ref 0.61–1.24)
GFR, Estimated: >60 mL/min (ref >60)
Glucose, Bld: 149 mg/dL — ABNORMAL HIGH (ref 70–99)
Potassium: 4.1 mmol/L (ref 3.5–5.1)
Sodium: 136 mmol/L (ref 135–145)

## 2023-10-04 MED ORDER — BENAZEPRIL HCL 20 MG PO TABS
40.0000 mg | ORAL_TABLET | Freq: Every day | ORAL | Status: DC
  Administered 2023-10-04 – 2023-10-06 (×3): 40 mg via ORAL
  Filled 2023-10-04 (×3): qty 2

## 2023-10-04 NOTE — Progress Notes (Signed)
 Mobility Specialist Progress Note:    10/04/23 1600  Mobility  Activity Ambulated independently in hallway  Level of Assistance Standby assist, set-up cues, supervision of patient - no hands on  Assistive Device None  Distance Ambulated (ft) 475 ft  Activity Response Tolerated well  Mobility Referral Yes  Mobility visit 1 Mobility  Mobility Specialist Start Time (ACUTE ONLY) 1551  Mobility Specialist Stop Time (ACUTE ONLY) 1601  Mobility Specialist Time Calculation (min) (ACUTE ONLY) 10 min   Received pt in bed having no complaints and agreeable to mobility. Pt was asymptomatic throughout ambulation and returned to room w/o fault. Left in bed w/ call bell in reach and all needs met.   [name] Mobility Specialist Please contact via Special Educational Needs Teacher or Rehab office at [phone]

## 2023-10-04 NOTE — Discharge Summary (Addendum)
 Physician Discharge Summary  Patient ID: Cameron [name] MRN: [id number] DOB/AGE: [birth year] 73 y.o.  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, clinical stage IA(T1c,N0)  Patient Active Problem List   Diagnosis Date Noted   Lung nodule 08/15/2023   Acute respiratory infection 06/28/2023   Loss of equilibrium 11/25/2022   Insomnia 11/25/2022   History of polycythemia 11/25/2022   Prostate cancer (HCC) 09/30/2022   Benign prostatic hyperplasia 07/08/2022   Acute cough 06/23/2022   Upper respiratory tract infection 06/23/2022   Preventative health care 05/04/2022   Abnormal hemoglobin (Hgb) (HCC) 04/08/2020   Need for hepatitis C screening test 04/26/2018   Centrilobular emphysema (HCC) 04/20/2018   Polycythemia 04/20/2018   CAD (coronary artery disease) 03/31/2018   Visit for preventive health examination 02/27/2018   Overweight (BMI 25.0-29.9) 04/23/2014   Tobacco dependence 12/29/2012   Health maintenance examination 03/13/2012   Hyperlipemia, mixed 03/13/2012   HTN (hypertension), benign 02/11/2012   Discharge Diagnoses:  Squamous cell carcinoma left upper lobe- pathologic stage IIB(pT1c,pN1)  Patient Active Problem List   Diagnosis Date Noted   S/P lobectomy of lung 10/03/2023   Lung nodule 08/15/2023   Acute respiratory infection 06/28/2023   Loss of equilibrium 11/25/2022   Insomnia 11/25/2022   History of polycythemia 11/25/2022   Prostate cancer (HCC) 09/30/2022   Benign prostatic hyperplasia 07/08/2022   Acute cough 06/23/2022   Upper respiratory tract infection 06/23/2022   Preventative health care 05/04/2022   Abnormal hemoglobin (Hgb) (HCC) 04/08/2020   Need for hepatitis C screening test 04/26/2018   Centrilobular emphysema (HCC) 04/20/2018   Polycythemia 04/20/2018   CAD (coronary artery disease) 03/31/2018   Visit for preventive health examination 02/27/2018   Overweight (BMI  25.0-29.9) 04/23/2014   Tobacco dependence 12/29/2012   Health maintenance examination 03/13/2012   Hyperlipemia, mixed 03/13/2012   HTN (hypertension), benign 02/11/2012   Discharged Condition: good  History of Present Illness:  Cameron [name] is a 73 year old smoker with a history of COPD, hypertension, hyperlipidemia, obesity, coronary calcification, prostate cancer status post TURP, colon polyp, and squamous of carcinoma of the skin.  He has smoked about a pack to a pack and a half a day for 50 years.  Currently smoking three fourths of a pack per day.   He had a low-dose CT for lung cancer screening in October.  It showed a left hilar fullness.  That was followed by a CT of the chest with contrast.  Which showed approximately 1.7 cm hilar mass in the left upper lobe.  PET/CT showed the mass was hypermetabolic.  He underwent navigational bronchoscopy by Dr. Brenna.  He noted a an endobronchial mass in the left upper lobe apical segmental bronchus.  Biopsy showed squamous cell carcinoma.  He was referred to [facility] for surgical resection. He was evaluated by Dr. 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  Currently only working when he wants to, but does not have any physical limitations when he does so.  Can walk up 2 flights of stairs without difficulty.  Denies any chest pain, pressure, tightness, or shortness of breath.  No change in appetite or unintentional weight loss.  The patient was instructed to quit smoking.  It was also felt the patient should undergo Robotic Assisted Left Video Thoracoscopy with left upper lobectomy vs pneumonectomy.  The risks and benefits of  the procedure were explained to the patient and he was agreeable to proceed.  Hospital Course:  Cameron [name] presented to [facility] on 10/03/2023.  He was taken to the operating room and underwent Robotic Assisted Left Video  Assisted Thoracoscopy with Left Upper Lobectomy, lymph node dissection and intercostal nerve block.  He tolerated the procedure without difficulty, was extubated, and taken to the PACU in stable condition.  The patient has done well post operatively.  His pain has been very well controlled.  He was hypertensive and his home medications were resumed.  His chest tube had an air leak post operatively.  Chest xray showed a left apical space.  The patient developed urinary retention initially post foley removal.  He did not require I/O cath and this resolved without further issue.  The patient's air leak resolved with some tidaling present.  His chest tube was clamped with repeat CXR remaining stable.  His chest tube was removed on 10/05/2023.  Post chest tube removal chest x ray showed tiny apical space.  He is ambulating independently.  His surgical incisions are free of infection.  He is medically stable for discharge home today.  Significant Diagnostic Studies: Nuclear Med  IMPRESSION: 1. The soft tissue fullness identified around the left upper lobe bronchus on recent diagnostic CT chest is markedly hypermetabolic with SUV max = 14.0. Imaging features are compatible with primary bronchogenic carcinoma. 2. No evidence for hypermetabolic mediastinal or right hilar lymphadenopathy. 3. Single focus of FDG uptake in the medial right hepatic lobe with SUV max = 5.1 superimposed on a background of mottled FDG hepatic accumulation. This is a relatively tiny focus of uptake with no underlying abnormality on noncontrast CT imaging. The possibility of metastatic liver lesion cannot be completely excluded. MRI abdomen with and without contrast recommended to further evaluate. 4. Tree-in-bud nodularity anterior left upper lobe is similar to prior, compatible with postobstructive etiology 5.  Aortic Atherosclerosis (ICD10-I70.0).    Electronically Signed   By: Camellia Candle M.D.   On: 08/03/2023  07:26  Treatments: surgery:   NAME: Cameron [name], Cameron [name]. MEDICAL RECORD NO: [id number] ACCOUNT NO: [id number] DATE OF BIRTH: [birth year] FACILITY: [facility] LOCATION: [facility] PHYSICIAN: Elspeth BROCKS. Kerrin, MD   Operative Report    DATE OF PROCEDURE: 10/03/2023   PREOPERATIVE DIAGNOSIS:  Squamous cell carcinoma, left upper lobe, clinical stage IA (T1, N0).   POSTOPERATIVE DIAGNOSIS:  Squamous cell carcinoma, left upper lobe, clinical stage IA (T1, N0).   PROCEDURE:  Xi assisted left upper lobectomy, lymph node dissection and intercostal nerve blocks levels 3 through 10.   SURGEON:  Elspeth BROCKS. Kerrin, MD   ASSISTANT:  Rocky Shad, PA  PATHOLOGY:  Discharge Exam: Blood pressure 134/68, pulse 64, temperature 97.7 F (36.5 C), temperature source Oral, resp. rate 17, height 5' 11 (1.803 m), weight 100.7 kg, SpO2 100%.  General appearance: alert, cooperative, and no distress Heart: regular rate and rhythm Lungs: clear to auscultation bilaterally Abdomen: soft, non-tender; bowel sounds normal; no masses,  no organomegaly Extremities: extremities normal, atraumatic, no cyanosis or edema Wound: clean and dry  Discharge disposition: 01-Home or Self Care  Allergies as of 10/06/2023   No Known Allergies      Medication List     STOP taking these medications    amoxicillin  875 MG tablet Commonly known as: AMOXIL        TAKE these medications    Acetaminophen  Extra Strength 500 MG Tabs Take 1-2 tablets (500-1,000 mg total) by mouth  every 6 (six) hours as needed.   amiodarone  200 MG tablet Commonly known as: PACERONE  Take 1 tablet (200 mg total) by mouth 2 (two) times daily for 10 days, then decrease to 1 tablet (200 mg) daily.   atorvastatin  20 MG tablet Commonly known as: LIPITOR Take 1 tablet (20 mg total) by mouth daily.   benazepril  40 MG tablet Commonly known as: LOTENSIN  Take 1 tablet (40 mg total) by mouth daily.   fluticasone  50 MCG/ACT nasal spray Commonly  known as: FLONASE  Place 1 spray into both nostrils daily.   fluticasone -salmeterol 250-50 MCG/ACT Aepb Commonly known as: Advair  Diskus Inhale 1 puff into the lungs in the morning and at bedtime.   gabapentin  300 MG capsule Commonly known as: Neurontin  Take 1 capsule (300 mg total) by mouth 2 (two) times daily.   hydrochlorothiazide  12.5 MG capsule Commonly known as: MICROZIDE  Take 1 capsule (12.5 mg total) by mouth daily.   ibuprofen  800 MG tablet Commonly known as: ADVIL  Take 800 mg by mouth every 8 (eight) hours as needed (pain.).   MAGNESIUM  PO Take 1 capsule by mouth in the morning. Magnesium  Citrate   metoprolol  tartrate 25 MG tablet Commonly known as: LOPRESSOR  Take 0.5 tablets (12.5 mg total) by mouth 2 (two) times daily.   oxyCODONE  5 MG immediate release tablet Commonly known as: Oxy IR/ROXICODONE  Take 1-2 tablets (5-10 mg total) by mouth every 4 (four) hours as needed for moderate pain (pain score 4-6).   valACYclovir  1000 MG tablet Commonly known as: VALTREX  Take 1,000 mg by mouth 2 (two) times daily as needed (fever blisters/outbreaks).   VITAMIN C PO Take 1 tablet by mouth in the morning.        Follow-up Information     Kerrin Elspeth BROCKS, MD Follow up on 10/19/2023.   Specialty: Cardiothoracic Surgery Why: Appointment is at 3:45 Contact information: [address] [place] [address] [phone]          IMAGING Follow up on 10/19/2023.   Why: Please get CXR at 2:45 at [facility] information: [address] [place]  [address]                Signed: Rocky Shad, PA-C  10/06/2023, 3:42 PM

## 2023-10-04 NOTE — Plan of Care (Signed)
  Problem: Respiratory: Goal: Respiratory status will improve Outcome: Progressing   Problem: Pain Management: Goal: Pain level will decrease Outcome: Progressing   Problem: Skin Integrity: Goal: Wound healing without signs and symptoms infection will improve Outcome: Progressing   Problem: Activity: Goal: Risk for activity intolerance will decrease Outcome: Progressing   Problem: Nutrition: Goal: Adequate nutrition will be maintained Outcome: Progressing   Problem: Coping: Goal: Level of anxiety will decrease Outcome: Progressing

## 2023-10-04 NOTE — Plan of Care (Signed)
  Problem: Education: Goal: Knowledge of disease or condition will improve Outcome: Progressing Goal: Knowledge of the prescribed therapeutic regimen will improve Outcome: Progressing   Problem: Activity: Goal: Risk for activity intolerance will decrease Outcome: Progressing   Problem: Cardiac: Goal: Will achieve and/or maintain hemodynamic stability Outcome: Progressing   Problem: Clinical Measurements: Goal: Postoperative complications will be avoided or minimized Outcome: Progressing   Problem: Respiratory: Goal: Respiratory status will improve Outcome: Progressing   Problem: Pain Management: Goal: Pain level will decrease Outcome: Progressing   Problem: Skin Integrity: Goal: Wound healing without signs and symptoms infection will improve Outcome: Progressing   Problem: Education: Goal: Knowledge of General Education information will improve Description: Including pain rating scale, medication(s)/side effects and non-pharmacologic comfort measures Outcome: Progressing   Problem: Health Behavior/Discharge Planning: Goal: Ability to manage health-related needs will improve Outcome: Progressing   Problem: Clinical Measurements: Goal: Ability to maintain clinical measurements within normal limits will improve Outcome: Progressing Goal: Will remain free from infection Outcome: Progressing Goal: Diagnostic test results will improve Outcome: Progressing Goal: Respiratory complications will improve Outcome: Progressing Goal: Cardiovascular complication will be avoided Outcome: Progressing   Problem: Activity: Goal: Risk for activity intolerance will decrease Outcome: Progressing   Problem: Nutrition: Goal: Adequate nutrition will be maintained Outcome: Progressing   Problem: Coping: Goal: Level of anxiety will decrease Outcome: Progressing   Problem: Elimination: Goal: Will not experience complications related to bowel motility Outcome: Progressing Goal:  Will not experience complications related to urinary retention Outcome: Progressing   Problem: Pain Management: Goal: General experience of comfort will improve Outcome: Progressing

## 2023-10-04 NOTE — Progress Notes (Addendum)
      [address]       [place] [address]             [phone]      1 Day Post-Op Procedure(s) (LRB): XI ROBOTIC ASSISTED THORACOSCOPY-LEFT UPPER LOBECTOMY (Left) INTERCOSTAL NERVE BLOCK (Left) LYMPH NODE BIOPSY (Left)  Subjective:  Sitting up in chair eating breakfast.  Pain is well controlled.  Objective: Vital signs in last 24 hours: Temp:  [97.6 F (36.4 C)-98.1 F (36.7 C)] 97.8 F (36.6 C) (01/07 0421) Pulse Rate:  [59-92] 75 (01/07 0421) Cardiac Rhythm: Normal sinus rhythm (01/06 1926) Resp:  [10-23] 11 (01/07 0421) BP: (108-154)/(67-99) 108/72 (01/07 0421) SpO2:  [92 %-100 %] 92 % (01/07 0421) Arterial Line BP: (154-175)/(63-77) 162/65 (01/06 1245) Weight:  [100.7 kg] 100.7 kg (01/06 1644)  Intake/Output from previous day: 01/06 0701 - 01/07 0700 In: 2998.6 [P.O.:480; I.V.:1718.6; IV Piggyback:800] Out: 1835 [Urine:1575; Blood:200; Chest Tube:60]  General appearance: alert, cooperative, and no distress Heart: regular rate and rhythm Lungs: clear to auscultation bilaterally Abdomen: soft, non-tender; bowel sounds normal; no masses,  no organomegaly Extremities: extremities normal, atraumatic, no cyanosis or edema Wound: clean and dry  Lab Results: Recent Labs    10/04/23 0425  WBC 14.1*  HGB 13.6  HCT 39.4  PLT 200   BMET:  Recent Labs    10/04/23 0425  NA 136  K 4.1  CL 101  CO2 25  GLUCOSE 149*  BUN 15  CREATININE 1.03  CALCIUM  8.8*    PT/INR: No results for input(s): LABPROT, INR in the last 72 hours. ABG    Component Value Date/Time   TCO2 24 08/15/2023 0856   CBG (last 3)  No results for input(s): GLUCAP in the last 72 hours.  Assessment/Plan: S/P Procedure(s) (LRB): XI ROBOTIC ASSISTED THORACOSCOPY-LEFT UPPER LOBECTOMY (Left) INTERCOSTAL NERVE BLOCK (Left) LYMPH NODE BIOPSY (Left)  CV- NSR, H/O HTN- on hydrochlorothiazide , SBP running in the 140s- will resume home Benicar Pulm- not requiring  oxygen, CXR with left apical space,  CT with low output, + air leak on waterseal will leave in place today,  continue IS Renal- creatinine slight increase to 1.07 likely due to use of Toradol , will discontinue D/C Central line Mild Leukcytosis- likely reactive will monitor Continue Lovenox  for DVT prophylaxis   LOS: 1 day    Rocky Shad, PA-C 10/04/2023  Patient seen and examined, agree with above Looks good Ambulate SCD + enoxaparin   Elspeth C. Kerrin, MD [facility] [phone]

## 2023-10-04 NOTE — Progress Notes (Signed)
 Mobility Specialist Progress Note:    10/04/23 1118  Therapy Vitals  Temp 97.6 F (36.4 C)  Temp Source Oral  Pulse Rate 66  Resp 20  BP 115/62  Patient Position (if appropriate) Sitting  Oxygen Therapy  SpO2 97 %  O2 Device Room Air  Mobility  Activity Ambulated with assistance in hallway  Level of Assistance Contact guard assist, steadying assist  Assistive Device None  Distance Ambulated (ft) 340 ft  Activity Response Tolerated well  Mobility Referral Yes  Mobility visit 1 Mobility  Mobility Specialist Start Time (ACUTE ONLY) 1056  Mobility Specialist Stop Time (ACUTE ONLY) 1106  Mobility Specialist Time Calculation (min) (ACUTE ONLY) 10 min   Pt received in bed, agreeable to mobility. Asymptomatic throughout w/ no complaints. Returned to room w/o fault. Pt left chair with call bell and all needs met.   [name] Mobility Specialist Please contact via Special Educational Needs Teacher or Rehab office at [phone]

## 2023-10-05 ENCOUNTER — Inpatient Hospital Stay (HOSPITAL_COMMUNITY): Payer: HMO

## 2023-10-05 LAB — COMPREHENSIVE METABOLIC PANEL
ALT: 11 U/L (ref 0–44)
AST: 23 U/L (ref 15–41)
Albumin: 3.2 g/dL — ABNORMAL LOW (ref 3.5–5.0)
Alkaline Phosphatase: 66 U/L (ref 38–126)
Anion gap: 8 (ref 5–15)
BUN: 16 mg/dL (ref 8–23)
CO2: 25 mmol/L (ref 22–32)
Calcium: 8.9 mg/dL (ref 8.9–10.3)
Chloride: 102 mmol/L (ref 98–111)
Creatinine, Ser: 1 mg/dL (ref 0.61–1.24)
GFR, Estimated: >60 mL/min (ref >60)
Glucose, Bld: 115 mg/dL — ABNORMAL HIGH (ref 70–99)
Potassium: 4.3 mmol/L (ref 3.5–5.1)
Sodium: 135 mmol/L (ref 135–145)
Total Bilirubin: 1 mg/dL (ref 0.0–1.2)
Total Protein: 5.7 g/dL — ABNORMAL LOW (ref 6.5–8.1)

## 2023-10-05 LAB — CBC
HCT: 41 % (ref 39.0–52.0)
Hemoglobin: 14.1 g/dL (ref 13.0–17.0)
MCH: 31.5 pg (ref 26.0–34.0)
MCHC: 34.4 g/dL (ref 30.0–36.0)
MCV: 91.5 fL (ref 80.0–100.0)
Platelets: 155 10*3/uL (ref 150–400)
RBC: 4.48 MIL/uL (ref 4.22–5.81)
RDW: 13.6 % (ref 11.5–15.5)
WBC: 10.3 10*3/uL (ref 4.0–10.5)
nRBC: 0 % (ref 0.0–0.2)

## 2023-10-05 LAB — MAGNESIUM: Magnesium: 2 mg/dL (ref 1.7–2.4)

## 2023-10-05 MED ORDER — AMIODARONE HCL 200 MG PO TABS
400.0000 mg | ORAL_TABLET | Freq: Two times a day (BID) | ORAL | Status: DC
  Administered 2023-10-05 – 2023-10-06 (×3): 400 mg via ORAL
  Filled 2023-10-05 (×3): qty 2

## 2023-10-05 MED ORDER — METOPROLOL TARTRATE 12.5 MG HALF TABLET
12.5000 mg | ORAL_TABLET | Freq: Two times a day (BID) | ORAL | Status: DC
  Administered 2023-10-05 – 2023-10-06 (×3): 12.5 mg via ORAL
  Filled 2023-10-05 (×3): qty 1

## 2023-10-05 NOTE — Progress Notes (Addendum)
      [address]       [place] [address]             [phone]      2 Days Post-Op Procedure(s) (LRB): XI ROBOTIC ASSISTED THORACOSCOPY-LEFT UPPER LOBECTOMY (Left) INTERCOSTAL NERVE BLOCK (Left) LYMPH NODE BIOPSY (Left)  Subjective:  Patient sitting up in chair.  Had issues with voiding yesterday, but this has resolved. He has also already moved his bowels.  He is ambulating without difficulty.  Objective: Vital signs in last 24 hours: Temp:  [97.6 F (36.4 C)-98.3 F (36.8 C)] 98.3 F (36.8 C) (01/08 0306) Pulse Rate:  [65-83] 83 (01/08 0306) Cardiac Rhythm: Normal sinus rhythm (01/07 2000) Resp:  [13-20] 14 (01/08 0306) BP: (108-147)/(62-82) 108/80 (01/08 0306) SpO2:  [96 %-98 %] 96 % (01/08 0306)  Intake/Output from previous day: 01/07 0701 - 01/08 0700 In: 1953.1 [P.O.:1920; I.V.:33.1] Out: 1900 [Urine:1800; Chest Tube:100]  General appearance: alert, cooperative, and no distress Heart: regular rate and rhythm Lungs: clear to auscultation bilaterally Abdomen: soft, non-tender; bowel sounds normal; no masses,  no organomegaly Extremities: extremities normal, atraumatic, no cyanosis or edema Wound: clean and ddry  Lab Results: Recent Labs    10/04/23 0425 10/05/23 0226  WBC 14.1* 10.3  HGB 13.6 14.1  HCT 39.4 41.0  PLT 200 155   BMET:  Recent Labs    10/04/23 0425 10/05/23 0226  NA 136 135  K 4.1 4.3  CL 101 102  CO2 25 25  GLUCOSE 149* 115*  BUN 15 16  CREATININE 1.03 1.00  CALCIUM  8.8* 8.9    PT/INR: No results for input(s): LABPROT, INR in the last 72 hours. ABG    Component Value Date/Time   TCO2 24 08/15/2023 0856   CBG (last 3)  No results for input(s): GLUCAP in the last 72 hours.  Assessment/Plan: S/P Procedure(s) (LRB): XI ROBOTIC ASSISTED THORACOSCOPY-LEFT UPPER LOBECTOMY (Left) INTERCOSTAL NERVE BLOCK (Left) LYMPH NODE BIOPSY (Left)  CV- NSR, BP is controlled- continue hydrochlorothiazide  and  benicar Pulm- CT on water  seal, output remains low.. ? Tiny air leak vs. Tidaling.. I suspect we can remove chest tube today, however will have Dr. Kerrin evaluate.. CXR stable Renal- creatinine improved after cessation of Toradol  GU- urinary retention yesterday, resolved.. now voiding without difficulty Leukocytosis- resolved Lovenox  for DVT prophylaxis Dispo- patient stable, overall doing very well.. CT on water  seal.. ? Tidaling vs. Tiny air leak.. I suspect CT can be removed, but will have Dr. Kerrin evaluate   LOS: 2 days    Rocky Shad, PA-C 10/05/2023  Patient seen and examined, agree with above Hard to determine if there is an actual air leak as there is extreme tidal movement Will clamp tube and repeat a CXR at noon  Cheshire C. Kerrin, MD [facility] [phone]

## 2023-10-05 NOTE — Plan of Care (Signed)
  Problem: Pain Management: Goal: Pain level will decrease Outcome: Progressing   Problem: Activity: Goal: Risk for activity intolerance will decrease Outcome: Progressing   Problem: Nutrition: Goal: Adequate nutrition will be maintained Outcome: Progressing   Problem: Elimination: Goal: Will not experience complications related to bowel motility Outcome: Progressing Goal: Will not experience complications related to urinary retention Outcome: Progressing   Problem: Pain Management: Goal: General experience of comfort will improve Outcome: Progressing

## 2023-10-05 NOTE — Plan of Care (Signed)
  Problem: Education: Goal: Knowledge of disease or condition will improve Outcome: Progressing Goal: Knowledge of the prescribed therapeutic regimen will improve Outcome: Progressing   Problem: Activity: Goal: Risk for activity intolerance will decrease Outcome: Progressing   Problem: Cardiac: Goal: Will achieve and/or maintain hemodynamic stability Outcome: Progressing   Problem: Clinical Measurements: Goal: Postoperative complications will be avoided or minimized Outcome: Progressing   Problem: Respiratory: Goal: Respiratory status will improve Outcome: Progressing   Problem: Pain Management: Goal: Pain level will decrease Outcome: Progressing   Problem: Skin Integrity: Goal: Wound healing without signs and symptoms infection will improve Outcome: Progressing   Problem: Education: Goal: Knowledge of General Education information will improve Description: Including pain rating scale, medication(s)/side effects and non-pharmacologic comfort measures Outcome: Progressing   Problem: Health Behavior/Discharge Planning: Goal: Ability to manage health-related needs will improve Outcome: Progressing   Problem: Clinical Measurements: Goal: Ability to maintain clinical measurements within normal limits will improve Outcome: Progressing Goal: Will remain free from infection Outcome: Progressing Goal: Diagnostic test results will improve Outcome: Progressing Goal: Respiratory complications will improve Outcome: Progressing Goal: Cardiovascular complication will be avoided Outcome: Progressing   Problem: Activity: Goal: Risk for activity intolerance will decrease Outcome: Progressing   Problem: Nutrition: Goal: Adequate nutrition will be maintained Outcome: Progressing   Problem: Coping: Goal: Level of anxiety will decrease Outcome: Progressing   Problem: Elimination: Goal: Will not experience complications related to bowel motility Outcome: Progressing Goal:  Will not experience complications related to urinary retention Outcome: Progressing   Problem: Pain Management: Goal: General experience of comfort will improve Outcome: Progressing   Problem: Safety: Goal: Ability to remain free from injury will improve Outcome: Progressing   Problem: Skin Integrity: Goal: Risk for impaired skin integrity will decrease Outcome: Progressing

## 2023-10-05 NOTE — Progress Notes (Signed)
 Mobility Specialist Progress Note:    10/05/23 0939  Mobility  Activity Ambulated with assistance in room  Level of Assistance Standby assist, set-up cues, supervision of patient - no hands on  Assistive Device None  Distance Ambulated (ft) 20 ft  Activity Response Tolerated well  Mobility Referral Yes  Mobility visit 1 Mobility  Mobility Specialist Start Time (ACUTE ONLY) 0935  Mobility Specialist Stop Time (ACUTE ONLY) [id number]  Mobility Specialist Time Calculation (min) (ACUTE ONLY) 2 min   Pt received in BR, requesting assistance back to bed. No complaints throughout. Pt left in bed with call bell and all needs met.  [name] Mobility Specialist Please contact via Special Educational Needs Teacher or Rehab office at [phone]

## 2023-10-05 NOTE — Progress Notes (Addendum)
 Mobility Specialist Progress Note:    10/05/23 0916  Therapy Vitals  Temp 97.6 F (36.4 C)  Temp Source Oral  Pulse Rate 76  Resp 16  BP 131/78  Patient Position (if appropriate) Lying  Oxygen Therapy  SpO2 96 %  Mobility  Activity Ambulated independently in hallway  Level of Assistance Modified independent, requires aide device or extra time  Assistive Device None  Distance Ambulated (ft) 475 ft  Activity Response Tolerated well  Mobility Referral Yes  Mobility visit 1 Mobility  Mobility Specialist Start Time (ACUTE ONLY) [id number]  Mobility Specialist Stop Time (ACUTE ONLY) 0929  Mobility Specialist Time Calculation (min) (ACUTE ONLY) 8 min   Received pt in bed having no complaints and agreeable to mobility. Pt was asymptomatic throughout ambulation and returned to room w/o fault. Left in BR w/ instructions to pull call string.  [name] Mobility Specialist Please contact via Special Educational Needs Teacher or Rehab office at [phone]

## 2023-10-05 NOTE — TOC CM/SW Note (Signed)
 Transition of Care [facility]) - Inpatient Brief Assessment   Patient Details  Name: Cameron [name] MRN: [id number] Date of Birth: 06/18/1951  Transition of Care [facility]) CM/SW Contact:    Cameron KANDICE Stain, RN Phone Number: 10/05/2023, 2:47 PM   Clinical [place] 2 Days Post-Op Procedure(s) (LRB): XI ROBOTIC ASSISTED THORACOSCOPY-LEFT UPPER LOBECTOMY. No TOC needs at this time.    Transition of Care Asessment: Insurance and Status: Insurance coverage has been reviewed Patient has primary care physician: Yes Home environment has been reviewed: safe to discharge home when medically stable Prior level of function:: independent Prior/Current Home Services: No current home services Social Drivers of Health Review: SDOH reviewed no interventions necessary Readmission risk has been reviewed: Yes Transition of care needs: no transition of care needs at this time

## 2023-10-06 ENCOUNTER — Inpatient Hospital Stay (HOSPITAL_COMMUNITY): Payer: HMO

## 2023-10-06 ENCOUNTER — Other Ambulatory Visit (HOSPITAL_COMMUNITY): Payer: Self-pay

## 2023-10-06 LAB — SURGICAL PATHOLOGY

## 2023-10-06 MED ORDER — ACETAMINOPHEN 500 MG PO TABS
500.0000 mg | ORAL_TABLET | Freq: Four times a day (QID) | ORAL | 0 refills | Status: DC | PRN
  Filled 2023-10-06: qty 30, 4d supply, fill #0

## 2023-10-06 MED ORDER — GABAPENTIN 300 MG PO CAPS
300.0000 mg | ORAL_CAPSULE | Freq: Two times a day (BID) | ORAL | 1 refills | Status: DC
  Filled 2023-10-06: qty 60, 30d supply, fill #0

## 2023-10-06 MED ORDER — METOPROLOL TARTRATE 25 MG PO TABS
12.5000 mg | ORAL_TABLET | Freq: Two times a day (BID) | ORAL | 1 refills | Status: DC
  Filled 2023-10-06: qty 30, 30d supply, fill #0

## 2023-10-06 MED ORDER — OXYCODONE HCL 5 MG PO TABS
5.0000 mg | ORAL_TABLET | ORAL | 0 refills | Status: DC | PRN
  Filled 2023-10-06: qty 30, 3d supply, fill #0

## 2023-10-06 MED ORDER — AMIODARONE HCL 200 MG PO TABS
200.0000 mg | ORAL_TABLET | Freq: Two times a day (BID) | ORAL | 1 refills | Status: DC
  Filled 2023-10-06: qty 60, 50d supply, fill #0

## 2023-10-06 MED ORDER — GABAPENTIN 300 MG PO CAPS
300.0000 mg | ORAL_CAPSULE | Freq: Two times a day (BID) | ORAL | 2 refills | Status: DC
  Filled 2023-10-06 (×2): qty 90, 45d supply, fill #0
  Filled 2023-11-16: qty 90, 45d supply, fill #1
  Filled 2023-11-16: qty 90, 45d supply, fill #0
  Filled 2024-01-02: qty 90, 45d supply, fill #1

## 2023-10-06 NOTE — Care Management Important Message (Signed)
 Important Message  Patient Details  Name: Cameron [name] MRN: [id number] Date of Birth: 11-May-1951   Important Message Given:  Yes - Medicare IM Patient left prior to IM delivery will mail a copy to the home address.    Taneeka Curtner 10/06/2023, 4:02 PM

## 2023-10-06 NOTE — Progress Notes (Addendum)
      [address]       [place] [address]             [phone]      3 Days Post-Op Procedure(s) (LRB): XI ROBOTIC ASSISTED THORACOSCOPY-LEFT UPPER LOBECTOMY (Left) INTERCOSTAL NERVE BLOCK (Left) LYMPH NODE BIOPSY (Left)  Subjective:  Patient sitting up in chair.  States he is ready to go home.  + independent ambulation  + BM  Objective: Vital signs in last 24 hours: Temp:  [97.6 F (36.4 C)-98 F (36.7 C)] 97.8 F (36.6 C) (01/09 0249) Pulse Rate:  [67-90] 67 (01/09 0249) Cardiac Rhythm: Normal sinus rhythm (01/08 1900) Resp:  [14-20] 17 (01/09 0249) BP: (112-131)/(68-78) 112/73 (01/09 0249) SpO2:  [92 %-97 %] 97 % (01/09 0249)  Intake/Output from previous day: 01/08 0701 - 01/09 0700 In: 960 [P.O.:960] Out: 1010 [Urine:1000; Chest Tube:10]  General appearance: alert, cooperative, and no distress Heart: regular rate and rhythm Lungs: clear to auscultation bilaterally Abdomen: soft, non-tender; bowel sounds normal; no masses,  no organomegaly Extremities: extremities normal, atraumatic, no cyanosis or edema Wound: clean and dry  Lab Results: Recent Labs    10/04/23 0425 10/05/23 0226  WBC 14.1* 10.3  HGB 13.6 14.1  HCT 39.4 41.0  PLT 200 155   BMET:  Recent Labs    10/04/23 0425 10/05/23 0226  NA 136 135  K 4.1 4.3  CL 101 102  CO2 25 25  GLUCOSE 149* 115*  BUN 15 16  CREATININE 1.03 1.00  CALCIUM  8.8* 8.9    PT/INR: No results for input(s): LABPROT, INR in the last 72 hours. ABG    Component Value Date/Time   TCO2 24 08/15/2023 0856   CBG (last 3)  No results for input(s): GLUCAP in the last 72 hours.  Assessment/Plan: S/P Procedure(s) (LRB): XI ROBOTIC ASSISTED THORACOSCOPY-LEFT UPPER LOBECTOMY (Left) INTERCOSTAL NERVE BLOCK (Left) LYMPH NODE BIOPSY (Left)  CV- Brief A. Fib yesterday- will continue Amiodarone , Lopressor , Benicar, hydrochlorothiazide  Pulm- CT removed, CXR with trace apical space, not  requiring oxygen GU- no further voiding issues Dispo- patient stable, will plan for d/c today if CXR is okay with Dr. Kerrin   LOS: 3 days    Rocky Shad, PA-C 10/06/2023  Patient seen and examined, agree with above Small apical space on CXR Path still pending Home today, instructions given  May Ozment C. Kerrin, MD [facility] [phone]

## 2023-10-06 NOTE — Care Management Important Message (Signed)
 Important Message  Patient Details  Name: Cameron [name] MRN: [id number] Date of Birth: 10/14/1950   Important Message Given:  Yes - Medicare IM     Claretta Deed Patient left prior to IM delivery will mail a copy of the IM to the patient home address , 12:11 PM

## 2023-10-13 ENCOUNTER — Other Ambulatory Visit: Payer: Self-pay

## 2023-10-15 NOTE — Progress Notes (Signed)
The proposed treatment discussed in conference is for discussion purpose only and is not a binding recommendation.  The patients have not been physically examined, or presented with their treatment options.  Therefore, final treatment plans cannot be decided.  

## 2023-10-17 ENCOUNTER — Other Ambulatory Visit: Payer: Self-pay | Admitting: Thoracic Surgery (Cardiothoracic Vascular Surgery)

## 2023-10-17 DIAGNOSIS — R911 Solitary pulmonary nodule: Secondary | ICD-10-CM

## 2023-10-18 ENCOUNTER — Other Ambulatory Visit (HOSPITAL_COMMUNITY): Payer: Self-pay

## 2023-10-19 ENCOUNTER — Ambulatory Visit
Admission: RE | Admit: 2023-10-19 | Discharge: 2023-10-19 | Disposition: A | Discharge status: Home / Self Care | Payer: HMO | Source: Ambulatory Visit | Attending: Thoracic Surgery (Cardiothoracic Vascular Surgery) | Admitting: Thoracic Surgery (Cardiothoracic Vascular Surgery)

## 2023-10-19 ENCOUNTER — Ambulatory Visit: Payer: Self-pay | Admitting: Thoracic Surgery (Cardiothoracic Vascular Surgery)

## 2023-10-19 DIAGNOSIS — R911 Solitary pulmonary nodule: Secondary | ICD-10-CM

## 2023-10-19 DIAGNOSIS — J939 Pneumothorax, unspecified: Secondary | ICD-10-CM | POA: Diagnosis not present

## 2023-10-20 ENCOUNTER — Ambulatory Visit (INDEPENDENT_AMBULATORY_CARE_PROVIDER_SITE_OTHER): Payer: Self-pay | Admitting: Thoracic Surgery (Cardiothoracic Vascular Surgery)

## 2023-10-20 ENCOUNTER — Encounter: Payer: Self-pay | Admitting: Thoracic Surgery (Cardiothoracic Vascular Surgery)

## 2023-10-20 VITALS — BP 151/84 | HR 52 | Resp 20 | Wt 223.6 lb

## 2023-10-20 DIAGNOSIS — Z902 Acquired absence of lung [part of]: Secondary | ICD-10-CM

## 2023-10-20 NOTE — Progress Notes (Signed)
[address]       [place]on [name] [address]             [phone]     HPI: Cameron [name] returns for follow-up after recent lobectomy.  Cameron [name] is a 73 year old man with a history of tobacco use, COPD, hypertension, hyperlipidemia, obesity, coronary calcification, prostate cancer, colon polyp, squamous cell carcinoma of the skin, and recently diagnosed squamous cell carcinoma of the lung.  He had about a 75-pack-year history of smoking.  Was smoking about three fourths of a pack per day prior to surgery.  He had a low-dose CT for lung cancer screening in October.  Was found to have a 1.7 cm hilar mass in the left upper lobe.  Hypermetabolic on PET.  Biopsy showed squamous cell carcinoma.  I did a robotic assisted left upper lobectomy on 10/03/2023.  His postoperative course was uncomplicated and he went home on postoperative day #3.  Pathology showed lymph node involvement in the region of the tumor and his final pathologic stage was 2B (pT1, pN1).  He feels well.  He had some confusion with his medications both on dosing and frequency.  I went over his cardiac medications and pain medications in detail with him.  He has only used oxycodone a few times since he has been home.  He has been taking gabapentin and ibuprofen.  Has not had any respiratory issues.  Overall feels well.  Past Medical History:  Diagnosis Date   Adenomatous colon polyp 05/2009; 11/2014   2010:Tubular adenoma, no high grade dysplasia.Marland Kitchen  + Hyperplastic polyps. 2016: Tubular adenoma-rpt 5 yrs.   Cancer (HCC)    TURP for prostate cancer   COPD (chronic obstructive pulmonary disease) (HCC)    Coronary artery disease    Difficult intubation    H/O chronic gastritis 05/2009   EGD: no h.pylori,dysplasia,or evidence of malignancy   Hyperlipidemia    lovaza from prior PMD-stopped due to cost   Hypertension    Obesity, Class I, BMI 30.0-34.9 (see actual BMI) 04/23/2014   Tobacco dependence    Ventral hernia     small  '  Current Outpatient Medications  Medication Sig Dispense Refill   acetaminophen (TYLENOL) 500 MG tablet Take 1-2 tablets (500-1,000 mg total) by mouth every 6 (six) hours as needed. 30 tablet 0   amiodarone (PACERONE) 200 MG tablet Take 1 tablet (200 mg total) by mouth 2 (two) times daily for 10 days, then decrease to 1 tablet (200 mg) daily. 60 tablet 1   Ascorbic Acid (VITAMIN C PO) Take 1 tablet by mouth in the morning.     atorvastatin (LIPITOR) 20 MG tablet Take 1 tablet (20 mg total) by mouth daily. 90 tablet 1   benazepril (LOTENSIN) 40 MG tablet Take 1 tablet (40 mg total) by mouth daily. 90 tablet 1   fluticasone (FLONASE) 50 MCG/ACT nasal spray Place 1 spray into both nostrils daily.     fluticasone-salmeterol (ADVAIR DISKUS) 250-50 MCG/ACT AEPB Inhale 1 puff into the lungs in the morning and at bedtime. 180 each 0   gabapentin (NEURONTIN) 300 MG capsule Take 1 capsule (300 mg total) by mouth 2 (two) times daily. 90 capsule 2   hydrochlorothiazide (MICROZIDE) 12.5 MG capsule Take 1 capsule (12.5 mg total) by mouth daily. 90 capsule 1   ibuprofen (ADVIL) 800 MG tablet Take 800 mg by mouth every 8 (eight) hours as needed (pain.).     MAGNESIUM PO Take 1  capsule by mouth in the morning. Magnesium Citrate     metoprolol tartrate (LOPRESSOR) 25 MG tablet Take 0.5 tablets (12.5 mg total) by mouth 2 (two) times daily. 30 tablet 1   oxyCODONE (OXY IR/ROXICODONE) 5 MG immediate release tablet Take 1-2 tablets (5-10 mg total) by mouth every 4 (four) hours as needed for moderate pain (pain score 4-6). 30 tablet 0   valACYclovir (VALTREX) 1000 MG tablet Take 1,000 mg by mouth 2 (two) times daily as needed (fever blisters/outbreaks).     No current facility-administered medications for this visit.    Physical Exam BP (!) 151/84 (BP Location: Right Arm, Patient Position: Sitting, Cuff Size: Normal)   Pulse (!) 52   Resp 20   Wt 223 lb 9.6 oz (101.4 kg)   SpO2 98% Comment: RA  BMI  31.25 kg/m  73 year old man in no acute distress Alert and oriented x 3 with no focal deficits Lungs diminished at left base but otherwise clear Incisions clean dry and intact Cardiac bradycardic, regular No peripheral edema  Diagnostic Tests: CHEST - 2 VIEW   COMPARISON:  10/06/2023   FINDINGS: Lungs are notable for mild volume loss of the left lung with slight elevation of the left hemidiaphragm. Improved but persistent tiny left apical pneumothorax. Right lung is clear. Cardiomediastinal silhouette is unremarkable. Small amount of subcutaneous emphysema over the left supraclavicular fossa improved. Remainder of the exam is unchanged.   IMPRESSION: Persistent, but improved tiny left apical pneumothorax.     Electronically Signed   By: Elberta Fortis M.D.   On: 10/19/2023 15:06   I personally reviewed the chest x-ray images.  Typical postoperative appearance.  Impression: Cameron [name] is a 73 year old man with a history of tobacco use, COPD, hypertension, hyperlipidemia, obesity, coronary calcification, prostate cancer, colon polyp, squamous cell carcinoma of the skin, and recently diagnosed squamous cell carcinoma of the lung.   Squamous cell carcinoma of the lung-status post left upper lobectomy.  Pathology showed N1 lymph node involvement.  Clinical stage was 1A but pathologic stage is IIB.  I discussed this findings with him.  He was very upset at the thought of needing chemotherapy.  He did agree to see Cameron [name] and consider the possibility.  He is doing well from surgery.  Has minimal discomfort.  He can gradually resume activities.  He may drive on a limited basis.  Appropriate precautions were discussed.  He had some confusion regarding medications.  I went over those in detail with him.  If he has any questions he will call and we will go over that with him by telephone.   Plan: Referral to Cameron [name] Return in 6 weeks with PA and lateral chest x-ray  Cameron Slot, MD [facility] [phone]

## 2023-10-21 ENCOUNTER — Other Ambulatory Visit (HOSPITAL_COMMUNITY): Payer: Self-pay

## 2023-10-21 ENCOUNTER — Other Ambulatory Visit: Payer: Self-pay

## 2023-10-21 ENCOUNTER — Telehealth: Payer: Self-pay

## 2023-10-21 MED ORDER — METOPROLOL TARTRATE 25 MG PO TABS
12.5000 mg | ORAL_TABLET | Freq: Two times a day (BID) | ORAL | 1 refills | Status: DC
  Filled 2023-10-21 – 2023-10-28 (×3): qty 30, 30d supply, fill #0
  Filled 2023-11-21: qty 30, 30d supply, fill #1

## 2023-10-21 NOTE — Telephone Encounter (Signed)
-----   Message from Loreli Slot sent at 10/21/2023 10:54 AM EST ----- Regarding: RE: Metoprolol refill Thanks for refilling, we will take care of it for now  [facility] ----- Message ----- From: Steve Rattler, RN Sent: 10/21/2023  10:50 AM EST To: Loreli Slot, MD Subject: Metoprolol refill                              Hey,   I know per your note you went over all his medications at his appointment yesterday, but he asked about a refill of his Metoprolol before they left yesterday due to taking one tab instead of half and he has ran out early. I refilled the metoprolol for him. Do you want to continue to refill? Or refer to PCP next time?  Thanks, Morrie Sheldon

## 2023-10-21 NOTE — Progress Notes (Signed)
Patient had confusion about medications and was taking whole tablet of Metoprolol instead of half tablet. They have since been taking the medication correctly, but will run out early due to incorrect dosage in the beginning. Refilled Metoprolol for patient to preferred pharmacy, [facility] at [address] per patient's request.

## 2023-10-24 ENCOUNTER — Other Ambulatory Visit (HOSPITAL_COMMUNITY): Payer: Self-pay

## 2023-10-25 ENCOUNTER — Inpatient Hospital Stay: Payer: HMO

## 2023-10-25 ENCOUNTER — Inpatient Hospital Stay: Payer: HMO | Attending: Internal Medicine | Admitting: Internal Medicine

## 2023-10-25 ENCOUNTER — Other Ambulatory Visit: Payer: Self-pay

## 2023-10-25 VITALS — BP 133/81 | HR 57 | Temp 98.0°F | Resp 17 | Ht 71.0 in | Wt 222.2 lb

## 2023-10-25 DIAGNOSIS — C3412 Malignant neoplasm of upper lobe, left bronchus or lung: Secondary | ICD-10-CM | POA: Diagnosis not present

## 2023-10-25 DIAGNOSIS — Z79899 Other long term (current) drug therapy: Secondary | ICD-10-CM | POA: Diagnosis not present

## 2023-10-25 DIAGNOSIS — F17210 Nicotine dependence, cigarettes, uncomplicated: Secondary | ICD-10-CM | POA: Diagnosis not present

## 2023-10-25 DIAGNOSIS — C771 Secondary and unspecified malignant neoplasm of intrathoracic lymph nodes: Secondary | ICD-10-CM | POA: Diagnosis not present

## 2023-10-25 DIAGNOSIS — I10 Essential (primary) hypertension: Secondary | ICD-10-CM | POA: Diagnosis not present

## 2023-10-25 DIAGNOSIS — C61 Malignant neoplasm of prostate: Secondary | ICD-10-CM | POA: Insufficient documentation

## 2023-10-25 DIAGNOSIS — C3490 Malignant neoplasm of unspecified part of unspecified bronchus or lung: Secondary | ICD-10-CM

## 2023-10-25 DIAGNOSIS — Z902 Acquired absence of lung [part of]: Secondary | ICD-10-CM | POA: Insufficient documentation

## 2023-10-25 DIAGNOSIS — I2510 Atherosclerotic heart disease of native coronary artery without angina pectoris: Secondary | ICD-10-CM | POA: Diagnosis not present

## 2023-10-25 DIAGNOSIS — J449 Chronic obstructive pulmonary disease, unspecified: Secondary | ICD-10-CM | POA: Insufficient documentation

## 2023-10-25 LAB — CBC WITH DIFFERENTIAL (CANCER CENTER ONLY)
Abs Immature Granulocytes: 0.05 10*3/uL (ref 0.00–0.07)
Basophils Absolute: 0.1 10*3/uL (ref 0.0–0.1)
Basophils Relative: 1 %
Eosinophils Absolute: 0.5 10*3/uL (ref 0.0–0.5)
Eosinophils Relative: 5 %
HCT: 45.7 % (ref 39.0–52.0)
Hemoglobin: 15.1 g/dL (ref 13.0–17.0)
Immature Granulocytes: 1 %
Lymphocytes Relative: 20 %
Lymphs Abs: 1.8 10*3/uL (ref 0.7–4.0)
MCH: 31.1 pg (ref 26.0–34.0)
MCHC: 33 g/dL (ref 30.0–36.0)
MCV: 94.2 fL (ref 80.0–100.0)
Monocytes Absolute: 0.7 10*3/uL (ref 0.1–1.0)
Monocytes Relative: 8 %
Neutro Abs: 6 10*3/uL (ref 1.7–7.7)
Neutrophils Relative %: 65 %
Platelet Count: 273 10*3/uL (ref 150–400)
RBC: 4.85 MIL/uL (ref 4.22–5.81)
RDW: 13.5 % (ref 11.5–15.5)
WBC Count: 9.2 10*3/uL (ref 4.0–10.5)
nRBC: 0 % (ref 0.0–0.2)

## 2023-10-25 LAB — CMP (CANCER CENTER ONLY)
ALT: 11 U/L (ref 0–44)
AST: 11 U/L — ABNORMAL LOW (ref 15–41)
Albumin: 4.1 g/dL (ref 3.5–5.0)
Alkaline Phosphatase: 92 U/L (ref 38–126)
Anion gap: 5 (ref 5–15)
BUN: 12 mg/dL (ref 8–23)
CO2: 32 mmol/L (ref 22–32)
Calcium: 9.4 mg/dL (ref 8.9–10.3)
Chloride: 97 mmol/L — ABNORMAL LOW (ref 98–111)
Creatinine: 1 mg/dL (ref 0.61–1.24)
GFR, Estimated: >60 mL/min (ref >60)
Glucose, Bld: 127 mg/dL — ABNORMAL HIGH (ref 70–99)
Potassium: 4 mmol/L (ref 3.5–5.1)
Sodium: 134 mmol/L — ABNORMAL LOW (ref 135–145)
Total Bilirubin: 0.6 mg/dL (ref 0.0–1.2)
Total Protein: 6.7 g/dL (ref 6.5–8.1)

## 2023-10-25 LAB — MOLECULAR PATHOLOGY

## 2023-10-25 NOTE — Progress Notes (Signed)
[facility] [phone]   [phone]  CONSULT NOTE  REFERRING PHYSICIAN: Dr. Charlett Lango  REASON FOR CONSULTATION:  73 years old white male recently diagnosed with lung cancer  HPI Cameron [name] is a 73 y.o. male into the clinic today for initial evaluation accompanied by his wife and his daughter Cameron [name] was available by FaceTime during the visit. Discussed the use of AI scribe software for clinical note transcription with the patient, who gave verbal consent to proceed.  History of Present Illness   The patient is a 73 year old male with lung cancer who presents for follow-up after surgery. He is accompanied by his wife and his daughter, Cameron [name], is present via Facetime.  He has a history of lung cancer, initially discovered through a CT lung screening program due to his long history of smoking. A suspicious mass was identified in the left hilar area of the lung on June 30, 2023. A follow-up chest CT with contrast revealed a 1.7 cm soft tissue mass in the left upper lobe. A PET scan on August 02, 2023, showed metabolic activity in this region and a spot in the brain, which was later ruled out by a negative brain MRI on September 02, 2023. He underwent a left upper lobectomy with lymph node dissection on October 03, 2023, which revealed a 2.9 cm squamous cell carcinoma with spread to hilar lymph nodes, staging him at 2B.  He feels good physically with no chest pain, shortness of breath, or coughing, including no hemoptysis. No nausea, vomiting, diarrhea, recent weight loss, headaches, or changes in vision.  His past medical history includes prostate cancer diagnosed in January 2024, treated with surgery, and a history of COPD, coronary artery disease, high cholesterol, high blood pressure, and hernia repair. He denies diabetes and stroke.  Family history reveals his mother passed away at 58 from heart disease and diabetes, and his father passed  away at 69 with emphysema and COPD. He has no siblings and no family history of cancer.  Socially, he has a significant smoking history of 56 years, having quit in December 2024. He does not consume alcohol or use drugs. 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      HPI  Past Medical History:  Diagnosis Date   Adenomatous colon polyp 05/2009; 11/2014   2010:Tubular adenoma, no high grade dysplasia.Marland Kitchen  + Hyperplastic polyps. 2016: Tubular adenoma-rpt 5 yrs.   Cancer (HCC)    TURP for prostate cancer   COPD (chronic obstructive pulmonary disease) (HCC)    Coronary artery disease    Difficult intubation    H/O chronic gastritis 05/2009   EGD: no h.pylori,dysplasia,or evidence of malignancy   Hyperlipidemia    lovaza from prior PMD-stopped due to cost   Hypertension    Obesity, Class I, BMI 30.0-34.9 (see actual BMI) 04/23/2014   Tobacco dependence    Ventral hernia    small    Past Surgical History:  Procedure Laterality Date   APPENDECTOMY  09/28/1971   BIOPSY  08/15/2023   Procedure: BIOPSY;  Surgeon: Josephine Igo, DO;  Location: [facility];  Service: Pulmonary;;   BLADDER DIVERTICULECTOMY N/A 09/30/2022   Procedure: BLADDER DIVERTICULECTOMY;  Surgeon: Heloise Purpura, MD;  Location: [facility];  Service: Urology;  Laterality: N/A;   CARPAL TUNNEL RELEASE Left    CHOLECYSTECTOMY N/A 07/06/2018   Procedure: LAPAROSCOPIC CHOLECYSTECTOMY WITH INTRAOPERATIVE CHOLANGIOGRAM;  Surgeon: Ovidio Kin,  MD;  Location: [facility];  Service: General;  Laterality: N/A;   COLONOSCOPY  09/27/2008   [facility]   CYSTOSCOPY N/A 09/30/2022   Procedure: Derinda Late;  Surgeon: Heloise Purpura, MD;  Location: [facility];  Service: Urology;  Laterality: N/A;   EYE SURGERY  2018   Cataracts removed only   FINGER SURGERY Left 12/2021   Left middle finger, laceration   HEMOSTASIS CONTROL  08/15/2023   Procedure: HEMOSTASIS CONTROL;  Surgeon: Josephine Igo, DO;   Location: [facility];  Service: Pulmonary;;   INTERCOSTAL NERVE BLOCK Left 10/03/2023   Procedure: INTERCOSTAL NERVE BLOCK;  Surgeon: Loreli Slot, MD;  Location: [facility] OR;  Service: Thoracic;  Laterality: Left;   LYMPH NODE BIOPSY Left 10/03/2023   Procedure: LYMPH NODE BIOPSY;  Surgeon: Loreli Slot, MD;  Location: [facility];  Service: Thoracic;  Laterality: Left;   ROBOT ASSISTED LAPAROSCOPIC RADICAL PROSTATECTOMY N/A 09/30/2022   Procedure: XI ROBOTIC ASSISTED LAPAROSCOPIC RADICAL PROSTATECTOMY LEVEL 3;  Surgeon: Heloise Purpura, MD;  Location: [facility];  Service: Urology;  Laterality: N/A;  270 MINUTES NEEDED FOR CASE   STRABISMUS SURGERY  age 30 or 9   TRANSURETHRAL RESECTION OF PROSTATE N/A 07/08/2022   Procedure: TRANSURETHRAL RESECTION OF THE PROSTATE (TURP)/ CYSTOSCOPY;  Surgeon: Heloise Purpura, MD;  Location: [facility];  Service: Urology;  Laterality: N/A;   VIDEO BRONCHOSCOPY N/A 08/15/2023   Procedure: VIDEO BRONCHOSCOPY WITHOUT FLUORO;  Surgeon: Josephine Igo, DO;  Location: [facility];  Service: Pulmonary;  Laterality: N/A;    Family History  Problem Relation Age of Onset   Diabetes Mother    Heart disease Mother    COPD Father    Hypertension Father    Hyperlipidemia Father    Heart attack Father    Dementia Father    Alcohol abuse Maternal Grandfather     Social History Social History   Tobacco Use   Smoking status: Every Day    Current packs/day: 1.00    Average packs/day: 1 pack/day for 40.0 years (40.0 ttl pk-yrs)    Types: Cigarettes   Smokeless tobacco: Never   Tobacco comments:    Maybe 1-1.5 cigarettes per day since 09/09/23  Vaping Use   Vaping status: Never Used  Substance Use Topics   Alcohol use: Not Currently   Drug use: No    No Known Allergies  Current Outpatient Medications  Medication Sig Dispense Refill   acetaminophen (TYLENOL) 500 MG tablet Take 1-2 tablets (500-1,000 mg total) by mouth every 6 (six) hours as needed. 30 tablet 0    amiodarone (PACERONE) 200 MG tablet Take 1 tablet (200 mg total) by mouth 2 (two) times daily for 10 days, then decrease to 1 tablet (200 mg) daily. 60 tablet 1   Ascorbic Acid (VITAMIN C PO) Take 1 tablet by mouth in the morning.     atorvastatin (LIPITOR) 20 MG tablet Take 1 tablet (20 mg total) by mouth daily. 90 tablet 1   benazepril (LOTENSIN) 40 MG tablet Take 1 tablet (40 mg total) by mouth daily. 90 tablet 1   fluticasone (FLONASE) 50 MCG/ACT nasal spray Place 1 spray into both nostrils daily.     fluticasone-salmeterol (ADVAIR DISKUS) 250-50 MCG/ACT AEPB Inhale 1 puff into the lungs in the morning and at bedtime. 180 each 0   gabapentin (NEURONTIN) 300 MG capsule Take 1 capsule (300 mg total) by mouth 2 (two) times daily. 90 capsule 2   hydrochlorothiazide (MICROZIDE) 12.5 MG capsule Take  1 capsule (12.5 mg total) by mouth daily. 90 capsule 1   ibuprofen (ADVIL) 800 MG tablet Take 800 mg by mouth every 8 (eight) hours as needed (pain.).     MAGNESIUM PO Take 1 capsule by mouth in the morning. Magnesium Citrate     metoprolol tartrate (LOPRESSOR) 25 MG tablet Take 0.5 tablets (12.5 mg total) by mouth 2 (two) times daily. 30 tablet 1   oxyCODONE (OXY IR/ROXICODONE) 5 MG immediate release tablet Take 1-2 tablets (5-10 mg total) by mouth every 4 (four) hours as needed for moderate pain (pain score 4-6). 30 tablet 0   valACYclovir (VALTREX) 1000 MG tablet Take 1,000 mg by mouth 2 (two) times daily as needed (fever blisters/outbreaks).     No current facility-administered medications for this visit.    Review of Systems  Constitutional: positive for fatigue Eyes: negative Ears, nose, mouth, throat, and face: negative Respiratory: negative Cardiovascular: negative Gastrointestinal: negative Genitourinary:negative Integument/breast: negative Hematologic/lymphatic: negative Musculoskeletal:negative Neurological: negative Behavioral/Psych: negative Endocrine:  negative Allergic/Immunologic: negative  Physical Exam  ZOX:WRUEA, healthy, no distress, well nourished, well developed, and anxious SKIN: skin color, texture, turgor are normal, no rashes or significant lesions HEAD: Normocephalic, No masses, lesions, tenderness or abnormalities EYES: normal, PERRLA, Conjunctiva are pink and non-injected EARS: External ears normal, Canals clear OROPHARYNX:no exudate, no erythema, and lips, buccal mucosa, and tongue normal  NECK: supple, no adenopathy, no JVD LYMPH:  no palpable lymphadenopathy, no hepatosplenomegaly LUNGS: clear to auscultation , and palpation HEART: regular rate & rhythm, no murmurs, and no gallops ABDOMEN:abdomen soft, non-tender, normal bowel sounds, and no masses or organomegaly BACK: Back symmetric, no curvature., No CVA tenderness EXTREMITIES:no joint deformities, effusion, or inflammation, no edema  NEURO: alert & oriented x 3 with fluent speech, no focal motor/sensory deficits  PERFORMANCE STATUS: ECOG 1  LABORATORY DATA: Lab Results  Component Value Date   WBC 9.2 10/25/2023   HGB 15.1 10/25/2023   HCT 45.7 10/25/2023   MCV 94.2 10/25/2023   PLT 273 10/25/2023      Chemistry      Component Value Date/Time   NA 134 (L) 10/25/2023 1358   K 4.0 10/25/2023 1358   CL 97 (L) 10/25/2023 1358   CO2 32 10/25/2023 1358   BUN 12 10/25/2023 1358   CREATININE 1.00 10/25/2023 1358      Component Value Date/Time   CALCIUM 9.4 10/25/2023 1358   ALKPHOS 92 10/25/2023 1358   AST 11 (L) 10/25/2023 1358   ALT 11 10/25/2023 1358   BILITOT 0.6 10/25/2023 1358       RADIOGRAPHIC STUDIES: DG Chest 2 View Result Date: 10/19/2023 CLINICAL DATA:  Post VATS. EXAM: CHEST - 2 VIEW COMPARISON:  10/06/2023 FINDINGS: Lungs are notable for mild volume loss of the left lung with slight elevation of the left hemidiaphragm. Improved but persistent tiny left apical pneumothorax. Right lung is clear. Cardiomediastinal silhouette is  unremarkable. Small amount of subcutaneous emphysema over the left supraclavicular fossa improved. Remainder of the exam is unchanged. IMPRESSION: Persistent, but improved tiny left apical pneumothorax. Electronically Signed   By: Elberta Fortis M.D.   On: 10/19/2023 15:06   DG Chest 2 View Result Date: 10/06/2023 CLINICAL DATA:  [id number]. Left apicolateral pneumothorax. [id number]. Status post lobectomy of lung. EXAM: CHEST - 2 VIEW COMPARISON:  Portable chest yesterday at 11:54 a.m. FINDINGS: 5:02 a.m. interval removal of left chest tube. Small left apicolateral pneumothorax is again noted, estimated 5% of the chest volume but slightly increased since  yesterday. Today there is 1.3 cm pleural-parenchymal separation, yesterday was 9 mm. There is also increased soft tissue emphysema in the lateral left chest wall and supraclavicular area. The lungs clear of infiltrates. Postsurgical changes left hilar region. Stable mediastinum with aortic tortuosity and calcific plaque. Normal cardiac size. There is patchy aortic calcific plaque. Stable mediastinum. Intact thoracic cage. IMPRESSION: 1. Interval removal of left chest tube. 2. Small left apicolateral pneumothorax is again noted, estimated 5% of the chest volume but slightly increased since yesterday. 3. Increased soft tissue emphysema in the lateral left chest wall and supraclavicular area. 4. Aortic atherosclerosis. Electronically Signed   By: Almira Bar M.D.   On: 10/06/2023 06:15   DG Chest 1V REPEAT Same Day Result Date: 10/05/2023 CLINICAL DATA:  Follow-up pneumothorax status post lobectomy EXAM: CHEST - 1 VIEW SAME DAY COMPARISON:  10/05/2023, 10/04/2023 FINDINGS: Similar position of left-sided chest tube. Small left apicolateral pneumothorax appears slightly increased with about 9 mm pleural-parenchymal separation compared to 5 mm. Slight increased gas within the left supraclavicular fossa. Postsurgical changes in the left hilar area. Stable cardiomediastinal  silhouette with aortic atherosclerosis. IMPRESSION: Similar position of left-sided chest tube. Small left apicolateral pneumothorax appears slightly increased in size compared to radiograph earlier today. Electronically Signed   By: Jasmine Pang M.D.   On: 10/05/2023 15:52   DG CHEST PORT 1 VIEW Result Date: 10/05/2023 CLINICAL DATA:  Status post lobectomy.  Follow-up radiograph. EXAM: PORTABLE CHEST 1 VIEW COMPARISON:  Chest radiograph dated 10/04/2023. FINDINGS: Interval removal of right IJ central venous line. Left-sided chest tube in similar position. There is a small left apical pneumothorax measuring approximately 5 mm in thickness (less than 10%). No focal consolidation or pleural effusion. Stable cardiomediastinal silhouette. Atherosclerotic calcification of the aorta. No acute osseous pathology. Small left chest wall soft tissue air. IMPRESSION: Small left apical pneumothorax. Left-sided chest tube in similar position. These results will be called to the ordering clinician or representative by the Radiologist Assistant, and communication documented in the PACS or [employer]. Electronically Signed   By: Elgie Collard M.D.   On: 10/05/2023 09:47   DG Chest Port 1 View Result Date: 10/04/2023 CLINICAL DATA:  Status post lobectomy. EXAM: PORTABLE CHEST 1 VIEW COMPARISON:  Chest radiograph dated 10/03/2023. FINDINGS: Right IJ central venous line with tip over central SVC. Left-sided chest tube in similar position. Minimal left lung base atelectasis. No focal consolidation, or pleural effusion. No detectable pneumothorax. Stable cardiomediastinal silhouette. Atherosclerotic calcification of the aorta. No acute osseous pathology. IMPRESSION: 1. No active disease. 2. Left-sided chest tube in similar position. No detectable pneumothorax. Electronically Signed   By: Elgie Collard M.D.   On: 10/04/2023 09:17   DG Chest Port 1 View Result Date: 10/03/2023 CLINICAL DATA:  Post lobectomy EXAM: PORTABLE  CHEST - 1 VIEW COMPARISON:  09/29/2023 FINDINGS: Right IJ central line to the distal SVC. No pneumothorax. Left chest tube in place with staple lines at the left hilum. No pneumothorax or effusion. Minimal linear atelectasis in the right mid lung and suprahilar region. Heart size and mediastinal contours are within normal limits. Aortic Atherosclerosis (ICD10-170.0). No effusion. Visualized bones unremarkable. IMPRESSION: 1. Left chest tube without pneumothorax. 2. Minimal right mid lung and suprahilar atelectasis. Electronically Signed   By: Corlis Leak M.D.   On: 10/03/2023 15:57   DG Chest 2 View Result Date: 10/02/2023 CLINICAL DATA:  [id number] Pre-op chest exam [id number] Lung nodule. EXAM: CHEST - 2 VIEW COMPARISON:  Chest CT  07/16/2023 FINDINGS: The heart is normal in size. Stable mediastinal contours, left hilar fullness on CT is not well assessed by radiograph. Aortic atherosclerosis. Chronic bronchial thickening. No acute airspace disease. No pleural fluid or pneumothorax. No acute osseous abnormalities. IMPRESSION: Chronic bronchial thickening. No acute findings. Electronically Signed   By: Narda Rutherford M.D.   On: 10/02/2023 08:30    ASSESSMENT AND PLAN:     Non-Small Cell Lung Cancer (NSCLC) - Stage IIB (T1c, N1, M0) Squamous cell Carcinoma diagnosed in January of 2025. Diagnosed with NSCLC, squamous cell carcinoma, stage IIB due to lymph node involvement and tumor size (2.9 cm). Initial detection via CT lung screening due to long-term smoking. Confirmed by PET scan and MRI, showing no metastasis to brain or abdomen. Underwent left upper lobectomy with lymph node dissection on October 03, 2023. Currently asymptomatic. Discussed adjuvant chemotherapy to reduce recurrence risk. Preferred regimen is cisplatin and docetaxel due to higher efficacy, despite potential side effects (nausea, vomiting, alopecia, nephrotoxicity, hepatotoxicity). Alternative regimen is carboplatin and paclitaxel if unable to  tolerate first-line treatment. Potential for immunotherapy based on PD-L1 marker testing. Chemotherapy improves 5-year survival from 50% to 60%. Option of observation without chemotherapy discussed, with close monitoring via scans. - Administer cisplatin and docetaxel every three weeks for four cycles - Administer injection to boost white blood count two days post-infusion - Test tumor tissue for PD-L1 marker - Consider immunotherapy if PD-L1 marker is positive - Schedule follow-up in two weeks to discuss treatment decision  Chronic Obstructive Pulmonary Disease (COPD) COPD likely exacerbated by long-term smoking. Currently well-managed with no reported exacerbations or respiratory distress. - Continue current management and monitoring  Coronary Artery Disease (CAD) CAD with no recent symptoms. Continue management as per cardiology recommendations. - Continue current management and monitoring  Hyperlipidemia Hyperlipidemia with no recent changes in management discussed. - Continue current management and monitoring  Hypertension Hypertension with no recent changes in management discussed. - Continue current management and monitoring  Prostate Cancer (Post-Surgical) Prostate cancer treated surgically on September 30, 2022. Recent follow-up with urologist showed no recurrence and normal lab results. - Continue regular follow-up with urologist  General Health Maintenance Smoking cessation achieved in December 2024. No alcohol or drug use. No known drug allergies. - Encourage continued smoking cessation - Maintain current lifestyle modifications  Follow-up - Schedule follow-up appointment in two weeks to finalize chemotherapy decision - Arrange for PD-L1 marker testing on tumor tissue.    The patient voices understanding of current disease status and treatment options and is in agreement with the current care plan.  All questions were answered. The patient knows to call the clinic with  any problems, questions or concerns. We can certainly see the patient much sooner if necessary.  Thank you so much for allowing me to participate in the care of [employer]. I will continue to follow up the patient with you and assist in his care.  The total time spent in the appointment was 90 minutes.  Disclaimer: This note was dictated with voice recognition software. Similar sounding words can inadvertently be transcribed and may not be corrected upon review.   Lajuana Matte October 25, 2023, 2:49 PM

## 2023-10-28 ENCOUNTER — Other Ambulatory Visit (HOSPITAL_COMMUNITY): Payer: Self-pay

## 2023-11-10 ENCOUNTER — Inpatient Hospital Stay: Payer: HMO | Attending: Internal Medicine

## 2023-11-10 ENCOUNTER — Inpatient Hospital Stay (HOSPITAL_BASED_OUTPATIENT_CLINIC_OR_DEPARTMENT_OTHER): Payer: HMO | Admitting: Internal Medicine

## 2023-11-10 ENCOUNTER — Telehealth: Payer: Self-pay | Admitting: Internal Medicine

## 2023-11-10 VITALS — BP 125/69 | HR 60 | Temp 97.6°F | Resp 16 | Wt 219.4 lb

## 2023-11-10 DIAGNOSIS — Z5111 Encounter for antineoplastic chemotherapy: Secondary | ICD-10-CM | POA: Diagnosis not present

## 2023-11-10 DIAGNOSIS — C3412 Malignant neoplasm of upper lobe, left bronchus or lung: Secondary | ICD-10-CM | POA: Insufficient documentation

## 2023-11-10 DIAGNOSIS — Z902 Acquired absence of lung [part of]: Secondary | ICD-10-CM | POA: Diagnosis not present

## 2023-11-10 DIAGNOSIS — Z79899 Other long term (current) drug therapy: Secondary | ICD-10-CM | POA: Insufficient documentation

## 2023-11-10 DIAGNOSIS — Z87891 Personal history of nicotine dependence: Secondary | ICD-10-CM | POA: Diagnosis not present

## 2023-11-10 LAB — CBC WITH DIFFERENTIAL (CANCER CENTER ONLY)
Abs Immature Granulocytes: 0.06 10*3/uL (ref 0.00–0.07)
Basophils Absolute: 0.1 10*3/uL (ref 0.0–0.1)
Basophils Relative: 1 %
Eosinophils Absolute: 0.3 10*3/uL (ref 0.0–0.5)
Eosinophils Relative: 4 %
HCT: 45.7 % (ref 39.0–52.0)
Hemoglobin: 15.7 g/dL (ref 13.0–17.0)
Immature Granulocytes: 1 %
Lymphocytes Relative: 20 %
Lymphs Abs: 1.6 10*3/uL (ref 0.7–4.0)
MCH: 31.7 pg (ref 26.0–34.0)
MCHC: 34.4 g/dL (ref 30.0–36.0)
MCV: 92.3 fL (ref 80.0–100.0)
Monocytes Absolute: 0.7 10*3/uL (ref 0.1–1.0)
Monocytes Relative: 9 %
Neutro Abs: 5.1 10*3/uL (ref 1.7–7.7)
Neutrophils Relative %: 65 %
Platelet Count: 222 10*3/uL (ref 150–400)
RBC: 4.95 MIL/uL (ref 4.22–5.81)
RDW: 13.2 % (ref 11.5–15.5)
WBC Count: 7.9 10*3/uL (ref 4.0–10.5)
nRBC: 0 % (ref 0.0–0.2)

## 2023-11-10 LAB — CMP (CANCER CENTER ONLY)
ALT: 9 U/L (ref 0–44)
AST: 13 U/L — ABNORMAL LOW (ref 15–41)
Albumin: 4.1 g/dL (ref 3.5–5.0)
Alkaline Phosphatase: 83 U/L (ref 38–126)
Anion gap: 3 — ABNORMAL LOW (ref 5–15)
BUN: 12 mg/dL (ref 8–23)
CO2: 33 mmol/L — ABNORMAL HIGH (ref 22–32)
Calcium: 9.2 mg/dL (ref 8.9–10.3)
Chloride: 100 mmol/L (ref 98–111)
Creatinine: 1.02 mg/dL (ref 0.61–1.24)
GFR, Estimated: >60 mL/min (ref >60)
Glucose, Bld: 106 mg/dL — ABNORMAL HIGH (ref 70–99)
Potassium: 4.3 mmol/L (ref 3.5–5.1)
Sodium: 136 mmol/L (ref 135–145)
Total Bilirubin: 0.8 mg/dL (ref 0.0–1.2)
Total Protein: 6.7 g/dL (ref 6.5–8.1)

## 2023-11-10 LAB — MAGNESIUM: Magnesium: 2 mg/dL (ref 1.7–2.4)

## 2023-11-10 MED ORDER — DEXAMETHASONE 4 MG PO TABS
ORAL_TABLET | ORAL | 1 refills | Status: DC
  Filled 2024-01-06: qty 30, 30d supply, fill #0

## 2023-11-10 MED ORDER — ONDANSETRON HCL 8 MG PO TABS: 8.0000 mg | ORAL_TABLET | Freq: Three times a day (TID) | ORAL | 1 refills | Status: DC | PRN

## 2023-11-10 MED ORDER — PROCHLORPERAZINE MALEATE 10 MG PO TABS: 10.0000 mg | ORAL_TABLET | Freq: Four times a day (QID) | ORAL | 1 refills | Status: DC | PRN

## 2023-11-10 NOTE — Progress Notes (Signed)
[facility] [phone]   Fax:([phone]  OFFICE PROGRESS NOTE  Cameron Emms, NP [address] [place] [address]  DIAGNOSIS: Stage IIA (T1c, N1, M0) Squamous cell Carcinoma diagnosed in January of 2025. Diagnosed with NSCLC, squamous cell carcinoma, stage IIB due to lymph node involvement and tumor size (2.9 cm).  PD-L1 TPS was 0%  PRIOR THERAPY: Status post left upper lobectomy with lymph node dissection on October 03, 2023.   CURRENT THERAPY: Systemic chemotherapy with cisplatin 75 Mg/M2 and docetaxel 75 Mg/M2 with Neulasta support every 3 weeks.  First dose November 16, 2023.  INTERVAL HISTORY: Cameron [name] 73 y.o. male returns to the clinic today for follow-up visit accompanied by his wife.  His daughter Cameron [name] was available by FaceTime during the visit.Discussed the use of AI scribe software for clinical note transcription with the patient, who gave verbal consent to proceed.  History of Present Illness   Cameron [name] is a 73 year old male with stage 2B non-small cell lung cancer who presents for discussion of adjuvant chemotherapy options. He is accompanied by his daughter.  He was diagnosed with stage 2B non-small cell lung cancer, specifically squamous cell carcinoma, in January 2025. His PD-L1 marker test is negative, ruling out immune therapy as an option.  He underwent a left upper lobectomy and lymph node sampling following his diagnosis.  He has concerns about chemotherapy side effects, such as nausea and hair loss. He has a history of feeling overwhelmed by medical information, as he described being 'floored' by the news of needing chemotherapy. His daughter is actively involved in his care and decision-making process.       MEDICAL HISTORY: Past Medical History:  Diagnosis Date   Adenomatous colon polyp 05/2009; 11/2014   2010:Tubular adenoma, no high grade dysplasia.Marland Kitchen  + Hyperplastic polyps. 2016: Tubular adenoma-rpt 5  yrs.   Cancer (HCC)    TURP for prostate cancer   COPD (chronic obstructive pulmonary disease) (HCC)    Coronary artery disease    Difficult intubation    H/O chronic gastritis 05/2009   EGD: no h.pylori,dysplasia,or evidence of malignancy   Hyperlipidemia    lovaza from prior PMD-stopped due to cost   Hypertension    Obesity, Class I, BMI 30.0-34.9 (see actual BMI) 04/23/2014   Tobacco dependence    Ventral hernia    small    ALLERGIES:  has no known allergies.  MEDICATIONS:  Current Outpatient Medications  Medication Sig Dispense Refill   acetaminophen (TYLENOL) 500 MG tablet Take 1-2 tablets (500-1,000 mg total) by mouth every 6 (six) hours as needed. 30 tablet 0   amiodarone (PACERONE) 200 MG tablet Take 1 tablet (200 mg total) by mouth 2 (two) times daily for 10 days, then decrease to 1 tablet (200 mg) daily. 60 tablet 1   Ascorbic Acid (VITAMIN C PO) Take 1 tablet by mouth in the morning.     atorvastatin (LIPITOR) 20 MG tablet Take 1 tablet (20 mg total) by mouth daily. 90 tablet 1   benazepril (LOTENSIN) 40 MG tablet Take 1 tablet (40 mg total) by mouth daily. 90 tablet 1   fluticasone (FLONASE) 50 MCG/ACT nasal spray Place 1 spray into both nostrils daily.     fluticasone-salmeterol (ADVAIR DISKUS) 250-50 MCG/ACT AEPB Inhale 1 puff into the lungs in the morning and at bedtime. 180 each 0   gabapentin (NEURONTIN) 300 MG capsule Take 1 capsule (300 mg total)  by mouth 2 (two) times daily. 90 capsule 2   hydrochlorothiazide (MICROZIDE) 12.5 MG capsule Take 1 capsule (12.5 mg total) by mouth daily. 90 capsule 1   ibuprofen (ADVIL) 800 MG tablet Take 800 mg by mouth every 8 (eight) hours as needed (pain.).     MAGNESIUM PO Take 1 capsule by mouth in the morning. Magnesium Citrate     metoprolol tartrate (LOPRESSOR) 25 MG tablet Take 1/2 tablet (12.5 mg total) by mouth 2 (two) times daily. 30 tablet 1   oxyCODONE (OXY IR/ROXICODONE) 5 MG immediate release tablet Take 1-2 tablets  (5-10 mg total) by mouth every 4 (four) hours as needed for moderate pain (pain score 4-6). 30 tablet 0   valACYclovir (VALTREX) 1000 MG tablet Take 1,000 mg by mouth 2 (two) times daily as needed (fever blisters/outbreaks).     No current facility-administered medications for this visit.    SURGICAL HISTORY:  Past Surgical History:  Procedure Laterality Date   APPENDECTOMY  09/28/1971   BIOPSY  08/15/2023   Procedure: BIOPSY;  Surgeon: Josephine Igo, DO;  Location: [facility];  Service: Pulmonary;;   BLADDER DIVERTICULECTOMY N/A 09/30/2022   Procedure: BLADDER DIVERTICULECTOMY;  Surgeon: Heloise Purpura, MD;  Location: [facility];  Service: Urology;  Laterality: N/A;   CARPAL TUNNEL RELEASE Left    CHOLECYSTECTOMY N/A 07/06/2018   Procedure: LAPAROSCOPIC CHOLECYSTECTOMY WITH INTRAOPERATIVE CHOLANGIOGRAM;  Surgeon: Ovidio Kin, MD;  Location: [facility] OR;  Service: General;  Laterality: N/A;   COLONOSCOPY  09/27/2008   [facility]   CYSTOSCOPY N/A 09/30/2022   Procedure: Derinda Late;  Surgeon: Heloise Purpura, MD;  Location: [facility];  Service: Urology;  Laterality: N/A;   EYE SURGERY  2018   Cataracts removed only   FINGER SURGERY Left 12/2021   Left middle finger, laceration   HEMOSTASIS CONTROL  08/15/2023   Procedure: HEMOSTASIS CONTROL;  Surgeon: Josephine Igo, DO;  Location: [facility];  Service: Pulmonary;;   INTERCOSTAL NERVE BLOCK Left 10/03/2023   Procedure: INTERCOSTAL NERVE BLOCK;  Surgeon: Loreli Slot, MD;  Location: [facility] OR;  Service: Thoracic;  Laterality: Left;   LYMPH NODE BIOPSY Left 10/03/2023   Procedure: LYMPH NODE BIOPSY;  Surgeon: Loreli Slot, MD;  Location: [facility] OR;  Service: Thoracic;  Laterality: Left;   ROBOT ASSISTED LAPAROSCOPIC RADICAL PROSTATECTOMY N/A 09/30/2022   Procedure: XI ROBOTIC ASSISTED LAPAROSCOPIC RADICAL PROSTATECTOMY LEVEL 3;  Surgeon: Heloise Purpura, MD;  Location: [facility];  Service: Urology;  Laterality: N/A;  270 MINUTES NEEDED  FOR CASE   STRABISMUS SURGERY  age 53 or 9   TRANSURETHRAL RESECTION OF PROSTATE N/A 07/08/2022   Procedure: TRANSURETHRAL RESECTION OF THE PROSTATE (TURP)/ CYSTOSCOPY;  Surgeon: Heloise Purpura, MD;  Location: [facility];  Service: Urology;  Laterality: N/A;   VIDEO BRONCHOSCOPY N/A 08/15/2023   Procedure: VIDEO BRONCHOSCOPY WITHOUT FLUORO;  Surgeon: Josephine Igo, DO;  Location: [facility];  Service: Pulmonary;  Laterality: N/A;    REVIEW OF SYSTEMS:  Constitutional: negative Eyes: negative Ears, nose, mouth, throat, and face: negative Respiratory: negative Cardiovascular: negative Gastrointestinal: negative Genitourinary:negative Integument/breast: negative Hematologic/lymphatic: negative Musculoskeletal:negative Neurological: negative Behavioral/Psych: negative Endocrine: negative Allergic/Immunologic: negative   PHYSICAL EXAMINATION: General appearance: alert, cooperative, fatigued, and no distress Head: Normocephalic, without obvious abnormality, atraumatic Neck: no adenopathy, no JVD, supple, symmetrical, trachea midline, and thyroid not enlarged, symmetric, no tenderness/mass/nodules Lymph nodes: Cervical, supraclavicular, and axillary nodes normal. Resp: clear to auscultation bilaterally Back: symmetric, no curvature. ROM normal. No CVA tenderness.  Cardio: regular rate and rhythm, S1, S2 normal, no murmur, click, rub or gallop GI: soft, non-tender; bowel sounds normal; no masses,  no organomegaly Extremities: extremities normal, atraumatic, no cyanosis or edema Neurologic: Alert and oriented X 3, normal strength and tone. Normal symmetric reflexes. Normal coordination and gait  ECOG PERFORMANCE STATUS: 1 - Symptomatic but completely ambulatory  Blood pressure 125/69, pulse 60, temperature 97.6 F (36.4 C), temperature source Temporal, resp. rate 16, weight 219 lb 6.4 oz (99.5 kg), SpO2 99%.  LABORATORY DATA: Lab Results  Component Value Date   WBC 7.9 11/10/2023    HGB 15.7 11/10/2023   HCT 45.7 11/10/2023   MCV 92.3 11/10/2023   PLT 222 11/10/2023      Chemistry      Component Value Date/Time   NA 134 (L) 10/25/2023 1358   K 4.0 10/25/2023 1358   CL 97 (L) 10/25/2023 1358   CO2 32 10/25/2023 1358   BUN 12 10/25/2023 1358   CREATININE 1.00 10/25/2023 1358      Component Value Date/Time   CALCIUM 9.4 10/25/2023 1358   ALKPHOS 92 10/25/2023 1358   AST 11 (L) 10/25/2023 1358   ALT 11 10/25/2023 1358   BILITOT 0.6 10/25/2023 1358       RADIOGRAPHIC STUDIES: DG Chest 2 View Result Date: 10/19/2023 CLINICAL DATA:  Post VATS. EXAM: CHEST - 2 VIEW COMPARISON:  10/06/2023 FINDINGS: Lungs are notable for mild volume loss of the left lung with slight elevation of the left hemidiaphragm. Improved but persistent tiny left apical pneumothorax. Right lung is clear. Cardiomediastinal silhouette is unremarkable. Small amount of subcutaneous emphysema over the left supraclavicular fossa improved. Remainder of the exam is unchanged. IMPRESSION: Persistent, but improved tiny left apical pneumothorax. Electronically Signed   By: Elberta Fortis M.D.   On: 10/19/2023 15:06    ASSESSMENT AND PLAN: This is a very pleasant 73 years old white male with Stage IIA (T1c, N1, M0) Squamous cell Carcinoma diagnosed in January of 2025. Diagnosed with NSCLC, squamous cell carcinoma, stage IIB due to lymph node involvement and tumor size (2.9 cm). Status post left upper lobectomy with lymph node dissection on October 03, 2023.  He has negative PD-L1 expression.    Stage IIB Non-Small Cell Lung Cancer (NSCLC) - Squamous Cell Carcinoma Diagnosed January 2025. Underwent left upper lobectomy and lymph node sampling. PD-L1 expression negative, ruling out immunotherapy. Primary treatment is adjuvant chemotherapy to reduce recurrence risk. Discussed benefits of chemotherapy in increasing five-year survival by ~10%. Explained side effects of cisplatin and docetaxel (nausea, delayed  nausea, hair loss, injection-related pain). Emphasized hydration and supportive medications. Discussed switching to carboplatin and paclitaxel if cisplatin is not tolerated. Shared decision-making with patient and daughter, opting for chemotherapy. Chemotherapy regimen: one day every three weeks for four cycles, with follow-up injections to boost WBC count. Discussed variability in individual responses to chemotherapy. - Administer cisplatin and docetaxel every three weeks for four cycles - Schedule chemotherapy class for patient education - Prescribe dexamethasone 4 mg, 2 tablets BID starting the day before, on the day of, and the day after chemotherapy - Prescribe anti-nausea medications - Schedule follow-up visit after first treatment to assess tolerance and side effects - Consider switching to carboplatin and paclitaxel if cisplatin is not tolerated - Advise use of Tylenol, pain medication, and Claritin for injection-related pain - Monitor closely for side effects and efficacy  General Health Maintenance Advised against unapproved supplements like Rick Simpson oil due to potential interactions with chemotherapy. -  Recommend multivitamins and minerals - Refer to dietician for nutritional guidance  Follow-up - Schedule follow-up visit after first chemotherapy treatment - Monitor MyChart for updates and communication.   The patient was advised to call immediately if he has any other concerning symptoms in the interval. The patient voices understanding of current disease status and treatment options and is in agreement with the current care plan.  All questions were answered. The patient knows to call the clinic with any problems, questions or concerns. We can certainly see the patient much sooner if necessary.  The total time spent in the appointment was 55 minutes.  Disclaimer: This note was dictated with voice recognition software. Similar sounding words can inadvertently be transcribed  and may not be corrected upon review.

## 2023-11-10 NOTE — Progress Notes (Signed)
START ON PATHWAY REGIMEN - Non-Small Cell Lung     A cycle is every 21 days:     Docetaxel      Cisplatin   **Always confirm dose/schedule in your pharmacy ordering system**  Patient Characteristics: Postoperative without Neoadjuvant Therapy (Pathologic Staging), Stage IB (Tumor Size = 4 cm) or Stage II / III, Adjuvant Chemotherapy Planned, Stage IB (Tumor Size = 4 cm), or Stage II / III, Squamous Cell Therapeutic Status: Postoperative without Neoadjuvant Therapy (Pathologic Staging) AJCC M Category: cM0 AJCC 9 Stage Grouping: IIA AJCC N Category: pN1 AJCC T Category: pT1c Check here if patient was staged using an edition other than AJCC Staging 9th Edition: false Adjuvant Chemotherapy Status: Adjuvant Chemotherapy Planned Histology: Squamous Cell Intent of Therapy: Curative Intent, Discussed with Patient

## 2023-11-11 ENCOUNTER — Telehealth: Payer: Self-pay

## 2023-11-11 NOTE — Telephone Encounter (Signed)
Patient called and LVM about lab results.  Per Cassie, PA- labs look okay and no concerns at this time. Patient verbalized understanding.

## 2023-11-12 ENCOUNTER — Other Ambulatory Visit: Payer: Self-pay

## 2023-11-14 ENCOUNTER — Telehealth: Payer: Self-pay | Admitting: Internal Medicine

## 2023-11-15 ENCOUNTER — Other Ambulatory Visit: Payer: Self-pay

## 2023-11-16 ENCOUNTER — Encounter: Payer: Self-pay | Admitting: Internal Medicine

## 2023-11-16 ENCOUNTER — Other Ambulatory Visit: Payer: HMO

## 2023-11-16 ENCOUNTER — Other Ambulatory Visit (HOSPITAL_COMMUNITY): Payer: Self-pay

## 2023-11-16 ENCOUNTER — Other Ambulatory Visit: Payer: Self-pay

## 2023-11-16 NOTE — Progress Notes (Signed)
Pharmacist Chemotherapy Monitoring - Initial Assessment    Anticipated start date: 11/23/23   The following has been reviewed per standard work regarding the patient's treatment regimen: The patient's diagnosis, treatment plan and drug doses, and organ/hematologic function Lab orders and baseline tests specific to treatment regimen  The treatment plan start date, drug sequencing, and pre-medications Prior authorization status  Patient's documented medication list, including drug-drug interaction screen and prescriptions for anti-emetics and supportive care specific to the treatment regimen The drug concentrations, fluid compatibility, administration routes, and timing of the medications to be used The patient's access for treatment and lifetime cumulative dose history, if applicable  The patient's medication allergies and previous infusion related reactions, if applicable   Changes made to treatment plan:  N/A  Follow up needed:  N/A   Cameron [name], RPH, 11/16/2023  10:09 AM

## 2023-11-17 ENCOUNTER — Encounter: Payer: Self-pay | Admitting: Internal Medicine

## 2023-11-17 ENCOUNTER — Other Ambulatory Visit (HOSPITAL_COMMUNITY): Payer: Self-pay

## 2023-11-17 ENCOUNTER — Telehealth: Payer: Self-pay

## 2023-11-17 NOTE — Telephone Encounter (Signed)
Patient called in this morning and had some questions about medications. Reviewed medication questions.  Informed patient to let our office know if we can assist with anything else.

## 2023-11-21 ENCOUNTER — Other Ambulatory Visit (HOSPITAL_COMMUNITY): Payer: Self-pay

## 2023-11-21 ENCOUNTER — Ambulatory Visit (INDEPENDENT_AMBULATORY_CARE_PROVIDER_SITE_OTHER): Payer: HMO

## 2023-11-21 VITALS — Ht 71.0 in | Wt 210.0 lb

## 2023-11-21 DIAGNOSIS — Z0000 Encounter for general adult medical examination without abnormal findings: Secondary | ICD-10-CM

## 2023-11-21 NOTE — Patient Instructions (Signed)
 Mr. Skare , Thank you for taking time to come for your Medicare Wellness Visit. I appreciate your ongoing commitment to your health goals. Please review the following plan we discussed and let me know if I can assist you in the future.   Referrals/Orders/Follow-Ups/Clinician Recommendations: none  This is a list of the screening recommended for you and due dates:  Health Maintenance  Topic Date Due   Zoster (Shingles) Vaccine (1 of 2) Never done   COVID-19 Vaccine (3 - Pfizer risk series) 01/09/2020   Flu Shot  12/26/2023*   Medicare Annual Wellness Visit  11/20/2024   Colon Cancer Screening  04/30/2025   DTaP/Tdap/Td vaccine (3 - Td or Tdap) 05/05/2030   Pneumonia Vaccine  Completed   Hepatitis C Screening  Completed   HPV Vaccine  Aged Out   Screening for Lung Cancer  Discontinued  *Topic was postponed. The date shown is not the original due date.    Advanced directives: (In Chart) A copy of your advanced directives are scanned into your chart should your provider ever need it.  Next Medicare Annual Wellness Visit scheduled for next year: Yes 11/22/23 @ 11:30am televisit

## 2023-11-21 NOTE — Progress Notes (Signed)
 Subjective:   Cameron [name] is a 73 y.o. who presents for a Medicare Wellness preventive visit.  Visit Complete: Virtual I connected with  Cameron [name] on 11/21/23 by a audio enabled telemedicine application and verified that I am speaking with the correct person using two identifiers.  Patient Location: Home  Provider Location: Office/Clinic  I discussed the limitations of evaluation and management by telemedicine. The patient expressed understanding and agreed to proceed.  Vital Signs: Because this visit was a virtual/telehealth visit, some criteria may be missing or patient reported. Any vitals not documented were not able to be obtained and vitals that have been documented are patient reported.  VideoDeclined- This patient declined Librarian, academic. Therefore the visit was completed with audio only.  AWV Questionnaire: No: Patient Medicare AWV questionnaire was not completed prior to this visit.  Cardiac Risk Factors include: advanced age (>22men, >25 women);dyslipidemia;male gender;hypertension     Objective:    Today's Vitals   11/21/23 1133  Weight: 210 lb (95.3 kg)  Height: 5\' 11"  (1.803 m)   Body mass index is 29.29 kg/m.     11/21/2023   11:52 AM 10/03/2023    6:14 AM 09/29/2023    9:39 AM 08/30/2023    9:48 AM 08/15/2023    8:18 AM 11/16/2022   10:23 AM 11/16/2022   10:18 AM  Advanced Directives  Does Patient Have a Medical Advance Directive? Yes Yes Yes Yes No  No  Type of Estate agent of [place];Living will Healthcare Power of [place];Living will Healthcare Power of [place];Living will      Does patient want to make changes to medical advance directive?  No - Patient declined No - Patient declined      Copy of Healthcare Power of Attorney in Chart? Yes - validated most recent copy scanned in chart (See row information) No - copy requested Yes - validated most recent copy scanned in chart (See row information)       Would patient like information on creating a medical advance directive?    No - Patient declined No - Patient declined Yes (ED - Information included in AVS)     Current Medications (verified) Outpatient Encounter Medications as of 11/21/2023  Medication Sig   acetaminophen (TYLENOL) 500 MG tablet Take 1-2 tablets (500-1,000 mg total) by mouth every 6 (six) hours as needed.   amiodarone (PACERONE) 200 MG tablet Take 1 tablet (200 mg total) by mouth 2 (two) times daily for 10 days, then decrease to 1 tablet (200 mg) daily.   Ascorbic Acid (VITAMIN C PO) Take 1 tablet by mouth in the morning.   atorvastatin (LIPITOR) 20 MG tablet Take 1 tablet (20 mg total) by mouth daily.   benazepril (LOTENSIN) 40 MG tablet Take 1 tablet (40 mg total) by mouth daily.   dexamethasone (DECADRON) 4 MG tablet Take 2 tabs (8 mg) twice daily starting the day before docetaxel, then daily x 3 days after cisplatin.Take with food.   fluticasone (FLONASE) 50 MCG/ACT nasal spray Place 1 spray into both nostrils daily.   fluticasone-salmeterol (ADVAIR DISKUS) 250-50 MCG/ACT AEPB Inhale 1 puff into the lungs in the morning and at bedtime.   gabapentin (NEURONTIN) 300 MG capsule Take 1 capsule (300 mg total) by mouth 2 (two) times daily.   hydrochlorothiazide (MICROZIDE) 12.5 MG capsule Take 1 capsule (12.5 mg total) by mouth daily.   ibuprofen (ADVIL) 800 MG tablet Take 800 mg by mouth every 8 (eight)  hours as needed (pain.).   MAGNESIUM PO Take 1 capsule by mouth in the morning. Magnesium Citrate   metoprolol tartrate (LOPRESSOR) 25 MG tablet Take 1/2 tablet (12.5 mg total) by mouth 2 (two) times daily.   ondansetron (ZOFRAN) 8 MG tablet Take 1 tablet (8 mg total) by mouth every 8 (eight) hours as needed for nausea or vomiting. Begin on the third day after cisplatin chemotherapy.   prochlorperazine (COMPAZINE) 10 MG tablet Take 1 tablet (10 mg total) by mouth every 6 (six) hours as needed for nausea or vomiting.    valACYclovir (VALTREX) 1000 MG tablet Take 1,000 mg by mouth 2 (two) times daily as needed (fever blisters/outbreaks).   oxyCODONE (OXY IR/ROXICODONE) 5 MG immediate release tablet Take 1-2 tablets (5-10 mg total) by mouth every 4 (four) hours as needed for moderate pain (pain score 4-6). (Patient not taking: Reported on 11/21/2023)   No facility-administered encounter medications on file as of 11/21/2023.    Allergies (verified) Patient has no known allergies.   History: Past Medical History:  Diagnosis Date   Adenomatous colon polyp 05/2009; 11/2014   2010:Tubular adenoma, no high grade dysplasia.Marland Kitchen  + Hyperplastic polyps. 2016: Tubular adenoma-rpt 5 yrs.   Cancer (HCC)    TURP for prostate cancer   COPD (chronic obstructive pulmonary disease) (HCC)    Coronary artery disease    Difficult intubation    H/O chronic gastritis 05/2009   EGD: no h.pylori,dysplasia,or evidence of malignancy   Hyperlipidemia    lovaza from prior PMD-stopped due to cost   Hypertension    Obesity, Class I, BMI 30.0-34.9 (see actual BMI) 04/23/2014   Tobacco dependence    Ventral hernia    small   Past Surgical History:  Procedure Laterality Date   APPENDECTOMY  09/28/1971   BIOPSY  08/15/2023   Procedure: BIOPSY;  Surgeon: Josephine Igo, DO;  [facility]: [facility];  Service: Pulmonary;;   BLADDER DIVERTICULECTOMY N/A 09/30/2022   Procedure: BLADDER DIVERTICULECTOMY;  Surgeon: Heloise Purpura, MD;  Location: [facility];  Service: Urology;  Laterality: N/A;   CARPAL TUNNEL RELEASE Left    CHOLECYSTECTOMY N/A 07/06/2018   Procedure: LAPAROSCOPIC CHOLECYSTECTOMY WITH INTRAOPERATIVE CHOLANGIOGRAM;  Surgeon: Ovidio Kin, MD;  Location: [facility];  Service: General;  Laterality: N/A;   COLONOSCOPY  09/27/2008   [facility]   CYSTOSCOPY N/A 09/30/2022   Procedure: Derinda Late;  Surgeon: Heloise Purpura, MD;  Location: [facility];  Service: Urology;  Laterality: N/A;   EYE SURGERY  2018   Cataracts removed only    FINGER SURGERY Left 12/2021   Left middle finger, laceration   HEMOSTASIS CONTROL  08/15/2023   Procedure: HEMOSTASIS CONTROL;  Surgeon: Josephine Igo, DO;  Location: [facility];  Service: Pulmonary;;   INTERCOSTAL NERVE BLOCK Left 10/03/2023   Procedure: INTERCOSTAL NERVE BLOCK;  Surgeon: Loreli Slot, MD;  Location: [facility] OR;  Service: Thoracic;  Laterality: Left;   LYMPH NODE BIOPSY Left 10/03/2023   Procedure: LYMPH NODE BIOPSY;  Surgeon: Loreli Slot, MD;  Location: [facility] OR;  Service: Thoracic;  Laterality: Left;   ROBOT ASSISTED LAPAROSCOPIC RADICAL PROSTATECTOMY N/A 09/30/2022   Procedure: XI ROBOTIC ASSISTED LAPAROSCOPIC RADICAL PROSTATECTOMY LEVEL 3;  Surgeon: Heloise Purpura, MD;  Location: [facility];  Service: Urology;  Laterality: N/A;  270 MINUTES NEEDED FOR CASE   STRABISMUS SURGERY  age 75 or 9   TRANSURETHRAL RESECTION OF PROSTATE N/A 07/08/2022   Procedure: TRANSURETHRAL RESECTION OF THE PROSTATE (TURP)/ CYSTOSCOPY;  Surgeon:  Heloise Purpura, MD;  Location: [facility];  Service: Urology;  Laterality: N/A;   VIDEO BRONCHOSCOPY N/A 08/15/2023   Procedure: VIDEO BRONCHOSCOPY WITHOUT FLUORO;  Surgeon: Josephine Igo, DO;  Location: [facility];  Service: Pulmonary;  Laterality: N/A;   Family History  Problem Relation Age of Onset   Diabetes Mother    Heart disease Mother    COPD Father    Hypertension Father    Hyperlipidemia Father    Heart attack Father    Dementia Father    Alcohol abuse Maternal Grandfather    Social History   Socioeconomic History   Marital status: Married    Spouse name: Not on file   Number of children: 2   Years of education: Not on file   Highest education level: Not on file  Occupational History   Occupation: Retired    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   Works with entertainment companies to set up venues    Orig from [place], has lived in [place] since 1970.   Tobacco 80 pack-yr hx, ongoing as of 03/2014.   Rare alcohol.  No drug use except DISTANT use of marijuana.   Social Drivers of Corporate investment banker Strain: Low Risk  (11/21/2023)   Overall Financial Resource Strain (CARDIA)    Difficulty of Paying Living Expenses: Not hard at all  Food Insecurity: No Food Insecurity (11/21/2023)   Hunger Vital Sign    Worried About Running Out of Food in the Last Year: Never true    Ran Out of Food in the Last Year: Never true  Transportation Needs: No Transportation Needs (11/21/2023)   PRAPARE - Administrator, Civil Service (Medical): No    Lack of Transportation (Non-Medical): No  Physical Activity: Sufficiently Active (11/21/2023)   Exercise Vital Sign    Days of Exercise per Week: 5 days    Minutes of Exercise per Session: 30 min  Stress: No Stress Concern Present (11/21/2023)   [employer] of Occupational Health - Occupational Stress Questionnaire    Feeling of Stress : Not at all  Social Connections: Moderately Integrated (11/21/2023)   Social Connection and Isolation Panel [NHANES]    Frequency of Communication with Friends and Family: More than three times a week    Frequency of Social Gatherings with Friends and Family: Twice a week    Attends Religious Services: More than 4 times per year    Active Member of [employer] or Organizations: No    Attends Banker Meetings: Never    Marital Status: Married    Tobacco Counseling Ready to quit: Not  Answered Counseling given: Not Answered Tobacco comments: Maybe 1-1.5 cigarettes per day since 09/09/23    Clinical Intake:  Pre-visit preparation completed: Yes  Pain : No/denies pain     BMI - recorded: 29.29 Nutritional Status: BMI 25 -29 Overweight  Nutritional Risks: None Diabetes: No  How often do you need to have someone help you when you read instructions, pamphlets, or other written materials from your doctor or pharmacy?: 1 - Never  Interpreter Needed?: No  Comments: lives with wife and dog Information entered by :: B.Rayelynn Loyal,LPN   Activities of Daily Living     11/21/2023   11:52 AM 10/03/2023    4:50 PM  In your present state of health, do you have any difficulty performing the following activities:  Hearing? 1 1  Vision? 0 0  Difficulty concentrating or making decisions? 0 0  Walking or climbing stairs? 0   Dressing or bathing? 0   Doing errands, shopping? 0   Preparing Food and eating ? N   Using the Toilet? N   In the past six months, have you accidently leaked urine? Y   Do you have problems with loss of bowel control? N   Managing your Medications? N   Managing your Finances? N   Housekeeping or managing your Housekeeping? N     Patient Care Team: [name], NP as PCP - General (Nurse Practitioner) Cherlyn Roberts, MD as Consulting Physician (Dermatology) Graylin Shiver, MD as Consulting Physician (Gastroenterology)  Indicate any recent Medical Services you may have received from other than [facility] providers in the past year (date may be approximate).     Assessment:   This is a routine wellness examination for [employer].  Hearing/Vision screen Hearing Screening - Comments:: Pt says he is not hearing quite as well but sufficient Vision Screening - Comments:: Pt says his vision is alright but needs to get his eye checked Will get eye md soon   Goals Addressed               This Visit's Progress     Patient Stated (pt-stated)         I want to whip these cancer treatments: to keep that cancer from coming back       Depression Screen     11/21/2023   11:47 AM 08/30/2023    9:41 AM 05/05/2023    8:59 AM 11/16/2022   10:10 AM 11/04/2022   10:05 AM 05/04/2022   10:19 AM 04/28/2022   11:18 AM  PHQ 2/9 Scores  PHQ - 2 Score 1 0 0 0 1 3 0  PHQ- 9 Score   0 0 7 8 0    Fall Risk     11/21/2023   11:39 AM 05/05/2023    9:00 AM 11/25/2022   10:25 AM 11/16/2022   10:07 AM 11/04/2022   10:12 AM  Fall Risk   Falls in the past year? 0 0 1 0 0  Number falls in past yr: 0 0 0 0 0  Injury with Fall? 0 0 0 0 0  Risk for fall due to : No Fall Risks No Fall Risks No Fall Risks No Fall Risks No Fall Risks  Follow up Education provided;Falls prevention discussed Falls evaluation completed Falls evaluation completed Education provided;Falls prevention discussed Falls evaluation completed    MEDICARE RISK AT HOME:  Medicare Risk at Home Any stairs in or around the home?: Yes If so, are there any without handrails?: Yes Home free of loose throw rugs in walkways, pet beds, electrical cords, etc?: Yes Life alert?: No Use of a cane, walker or w/c?: No Grab bars in the bathroom?: No Shower chair or bench in shower?: No Elevated toilet seat or a handicapped toilet?:  Yes  TIMED UP AND GO:  Was the test performed?  No  Cognitive Function: 6CIT completed    04/03/2018   10:59 AM  MMSE - Mini Mental State Exam  Orientation to time 5  Orientation to Place 5  Registration 3  Attention/ Calculation 5  Recall 2  Language- name 2 objects 2  Language- repeat 1  Language- follow 3 step command 3  Language- read & follow direction 1  Write a sentence 1  Copy design 1  Total score 29        11/21/2023   11:54 AM 11/16/2022   10:20 AM 02/06/2020   12:26 PM  6CIT Screen  What Year? 0 points 0 points 0 points  What month? 0 points 0 points 0 points  What time? 0 points 0 points 0 points  Count back from 20 0 points 0 points 0 points   Months in reverse 0 points 0 points 0 points  Repeat phrase 0 points 0 points 0 points  Total Score 0 points 0 points 0 points    Immunizations Immunization History  Administered Date(s) Administered   Fluad Quad(high Dose 65+) 06/22/2019, 07/17/2020, 07/10/2021, 08/24/2022   Influenza,inj,Quad PF,6+ Mos 05/22/2018   PFIZER(Purple Top)SARS-COV-2 Vaccination 11/18/2019, 12/12/2019   Pneumococcal Conjugate-13 07/17/2020   Pneumococcal Polysaccharide-23 04/20/2018   Tdap 02/11/2012, 05/05/2020    Screening Tests Health Maintenance  Topic Date Due   Zoster Vaccines- Shingrix (1 of 2) Never done   COVID-19 Vaccine (3 - Pfizer risk series) 01/09/2020   INFLUENZA VACCINE  12/26/2023 (Originally 04/28/2023)   Medicare Annual Wellness (AWV)  11/20/2024   Colonoscopy  04/30/2025   DTaP/Tdap/Td (3 - Td or Tdap) 05/05/2030   Pneumonia Vaccine 61+ Years old  Completed   Hepatitis C Screening  Completed   HPV VACCINES  Aged Out   Lung Cancer Screening  Discontinued    Health Maintenance  Health Maintenance Due  Topic Date Due   Zoster Vaccines- Shingrix (1 of 2) Never done   COVID-19 Vaccine (3 - Pfizer risk series) 01/09/2020   Health Maintenance Items Addressed: none needed   Additional Screening:  Vision Screening: Recommended annual ophthalmology exams for early detection of glaucoma and other disorders of the eye.  Dental Screening: Recommended annual dental exams for proper oral hygiene  Community Resource Referral / Chronic Care Management: CRR required this visit?  No   CCM required this visit?  Appt scheduled with PCP     Plan:     I have personally reviewed and noted the following in the patient's chart:   Medical and social history Use of alcohol, tobacco or illicit drugs  Current medications and supplements including opioid prescriptions. Patient is not currently taking opioid prescriptions. Functional ability and status Nutritional status Physical  activity Advanced directives List of other physicians Hospitalizations, surgeries, and ER visits in previous 12 months Vitals Screenings to include cognitive, depression, and falls Referrals and appointments  In addition, I have reviewed and discussed with patient certain preventive protocols, quality metrics, and best practice recommendations. A written personalized care plan for preventive services as well as general preventive health recommendations were provided to patient.     Sue Lush, LPN   1/61/0960   After Visit Summary: (MyChart) Due to this being a telephonic visit, the after visit summary with patients personalized plan was offered to patient via MyChart   Notes:  Pt says he is a little nervous about recent dx of lung ca and starting chemo  on this week. He has no concerns or questions at this time.  Patient declined referral to establish with an ophthalmologist.

## 2023-11-22 ENCOUNTER — Inpatient Hospital Stay: Payer: HMO

## 2023-11-22 ENCOUNTER — Other Ambulatory Visit: Payer: Self-pay

## 2023-11-22 ENCOUNTER — Other Ambulatory Visit (HOSPITAL_COMMUNITY): Payer: Self-pay

## 2023-11-22 MED FILL — Fosaprepitant Dimeglumine For IV Infusion 150 MG (Base Eq): INTRAVENOUS | Qty: 5 | Status: AC

## 2023-11-23 ENCOUNTER — Inpatient Hospital Stay: Payer: HMO

## 2023-11-23 ENCOUNTER — Encounter: Payer: Self-pay | Admitting: Internal Medicine

## 2023-11-23 VITALS — BP 122/73 | HR 65 | Temp 98.1°F | Resp 16

## 2023-11-23 DIAGNOSIS — C3412 Malignant neoplasm of upper lobe, left bronchus or lung: Secondary | ICD-10-CM

## 2023-11-23 DIAGNOSIS — Z5111 Encounter for antineoplastic chemotherapy: Secondary | ICD-10-CM | POA: Diagnosis not present

## 2023-11-23 LAB — CMP (CANCER CENTER ONLY)
ALT: 11 U/L (ref 0–44)
AST: 13 U/L — ABNORMAL LOW (ref 15–41)
Albumin: 4.2 g/dL (ref 3.5–5.0)
Alkaline Phosphatase: 84 U/L (ref 38–126)
Anion gap: 10 (ref 5–15)
BUN: 14 mg/dL (ref 8–23)
CO2: 26 mmol/L (ref 22–32)
Calcium: 9.2 mg/dL (ref 8.9–10.3)
Chloride: 95 mmol/L — ABNORMAL LOW (ref 98–111)
Creatinine: 0.97 mg/dL (ref 0.61–1.24)
GFR, Estimated: >60 mL/min (ref >60)
Glucose, Bld: 220 mg/dL — ABNORMAL HIGH (ref 70–99)
Potassium: 4.1 mmol/L (ref 3.5–5.1)
Sodium: 131 mmol/L — ABNORMAL LOW (ref 135–145)
Total Bilirubin: 0.6 mg/dL (ref 0.0–1.2)
Total Protein: 6.8 g/dL (ref 6.5–8.1)

## 2023-11-23 LAB — CBC WITH DIFFERENTIAL (CANCER CENTER ONLY)
Abs Immature Granulocytes: 0.05 10*3/uL (ref 0.00–0.07)
Basophils Absolute: 0 10*3/uL (ref 0.0–0.1)
Basophils Relative: 0 %
Eosinophils Absolute: 0 10*3/uL (ref 0.0–0.5)
Eosinophils Relative: 0 %
HCT: 44.5 % (ref 39.0–52.0)
Hemoglobin: 15.3 g/dL (ref 13.0–17.0)
Immature Granulocytes: 1 %
Lymphocytes Relative: 7 %
Lymphs Abs: 0.8 10*3/uL (ref 0.7–4.0)
MCH: 31.7 pg (ref 26.0–34.0)
MCHC: 34.4 g/dL (ref 30.0–36.0)
MCV: 92.3 fL (ref 80.0–100.0)
Monocytes Absolute: 0.5 10*3/uL (ref 0.1–1.0)
Monocytes Relative: 4 %
Neutro Abs: 9.7 10*3/uL — ABNORMAL HIGH (ref 1.7–7.7)
Neutrophils Relative %: 88 %
Platelet Count: 245 10*3/uL (ref 150–400)
RBC: 4.82 MIL/uL (ref 4.22–5.81)
RDW: 13 % (ref 11.5–15.5)
WBC Count: 11.1 10*3/uL — ABNORMAL HIGH (ref 4.0–10.5)
nRBC: 0 % (ref 0.0–0.2)

## 2023-11-23 LAB — MAGNESIUM: Magnesium: 1.8 mg/dL (ref 1.7–2.4)

## 2023-11-23 MED ORDER — SODIUM CHLORIDE 0.9 % IV SOLN
75.0000 mg/m2 | Freq: Once | INTRAVENOUS | Status: AC
  Administered 2023-11-23: 167 mg via INTRAVENOUS
  Filled 2023-11-23: qty 167

## 2023-11-23 MED ORDER — POTASSIUM CHLORIDE IN NACL 20-0.9 MEQ/L-% IV SOLN
Freq: Once | INTRAVENOUS | Status: AC
  Filled 2023-11-23: qty 1000

## 2023-11-23 MED ORDER — SODIUM THIOSULFATE 250 MG/ML IV SOLN
12.5000 g | Freq: Once | INTRAVENOUS | Status: AC
  Administered 2023-11-23: 12.5 g via INTRAVENOUS
  Filled 2023-11-23: qty 50

## 2023-11-23 MED ORDER — PALONOSETRON HCL INJECTION 0.25 MG/5ML
0.2500 mg | Freq: Once | INTRAVENOUS | Status: AC
  Administered 2023-11-23: 0.25 mg via INTRAVENOUS
  Filled 2023-11-23: qty 5

## 2023-11-23 MED ORDER — DEXAMETHASONE SODIUM PHOSPHATE 10 MG/ML IJ SOLN
10.0000 mg | Freq: Once | INTRAMUSCULAR | Status: AC
  Administered 2023-11-23: 10 mg via INTRAVENOUS
  Filled 2023-11-23: qty 1

## 2023-11-23 MED ORDER — SODIUM CHLORIDE 0.9 % IV SOLN
75.0000 mg/m2 | Freq: Once | INTRAVENOUS | Status: AC
  Administered 2023-11-23: 160 mg via INTRAVENOUS
  Filled 2023-11-23: qty 16

## 2023-11-23 MED ORDER — FOSAPREPITANT DIMEGLUMINE INJECTION 150 MG
150.0000 mg | Freq: Once | INTRAVENOUS | Status: AC
  Administered 2023-11-23: 150 mg via INTRAVENOUS
  Filled 2023-11-23: qty 150

## 2023-11-23 MED ORDER — SODIUM CHLORIDE 0.9 % IV SOLN
INTRAVENOUS | Status: DC
Start: 2023-11-23 — End: 2023-11-23

## 2023-11-23 MED ORDER — MAGNESIUM SULFATE 2 GM/50ML IV SOLN
2.0000 g | Freq: Once | INTRAVENOUS | Status: AC
Start: 2023-11-23 — End: 2023-11-23
  Administered 2023-11-23: 2 g via INTRAVENOUS
  Filled 2023-11-23: qty 50

## 2023-11-23 NOTE — Progress Notes (Signed)
 Per extravasation protocol, RN will draw up 1.6 mL Sodium thiosulfate 25% + 8.4 mL of sterile water.  Administer 2 mL only of that 10 mL volume for each 100 mg Cisplatin.  Administer slow IV push (3-5 min) through needle & into extravasation site.  Apply cold compress q4h while awake for first 24 hrs.   Pt will return for assessment daily per extrav protocol.  Ebony Hail, Pharm.D., CPP 11/23/2023@3 :59 PM

## 2023-11-23 NOTE — Patient Instructions (Addendum)
 [facility] - A DEPT OF [facility]  Discharge Instructions: Thank you for choosing [facility] to provide your oncology and hematology care.   If you have a lab appointment with the Cancer Center, please go directly to the Cancer Center and check in at the registration area.   Wear comfortable clothing and clothing appropriate for easy access to any Portacath or PICC line.   We strive to give you quality time with your provider. You may need to reschedule your appointment if you arrive late (15 or more minutes).  Arriving late affects you and other patients whose appointments are after yours.  Also, if you miss three or more appointments without notifying the office, you may be dismissed from the clinic at the provider's discretion.      For prescription refill requests, have your pharmacy contact our office and allow 72 hours for refills to be completed.    Today you received the following chemotherapy and/or immunotherapy agents: Taxotere, Cisplatin      To help prevent nausea and vomiting after your treatment, we encourage you to take your nausea medication as directed.  BELOW ARE SYMPTOMS THAT SHOULD BE REPORTED IMMEDIATELY: *FEVER GREATER THAN 100.4 F (38 C) OR HIGHER *CHILLS OR SWEATING *NAUSEA AND VOMITING THAT IS NOT CONTROLLED WITH YOUR NAUSEA MEDICATION *UNUSUAL SHORTNESS OF BREATH *UNUSUAL BRUISING OR BLEEDING *URINARY PROBLEMS (pain or burning when urinating, or frequent urination) *BOWEL PROBLEMS (unusual diarrhea, constipation, pain near the anus) TENDERNESS IN MOUTH AND THROAT WITH OR WITHOUT PRESENCE OF ULCERS (sore throat, sores in mouth, or a toothache) UNUSUAL RASH, SWELLING OR PAIN  UNUSUAL VAGINAL DISCHARGE OR ITCHING   Items with * indicate a potential emergency and should be followed up as soon as possible or go to the Emergency Department if any problems should occur.  Please show the CHEMOTHERAPY ALERT CARD or  IMMUNOTHERAPY ALERT CARD at check-in to the Emergency Department and triage nurse.  Should you have questions after your visit or need to cancel or reschedule your appointment, please contact [facility] - A DEPT OF [facility]  Dept: [phone]  and follow the prompts.  Office hours are 8:00 a.m. to 4:30 p.m. Monday - Friday. Please note that voicemails left after 4:00 p.m. may not be returned until the following business day.  We are closed weekends and major holidays. You have access to a nurse at all times for urgent questions. Please call the main number to the clinic Dept: [phone] and follow the prompts.   For any non-urgent questions, you may also contact your provider using MyChart. We now offer e-Visits for anyone 92 and older to request care online for non-urgent symptoms. For details visit [email or web address].   Also download the MyChart app! Go to the app store, search "MyChart", open the app, select [place], and log in with your MyChart username and password.  Extravasation/Infiltration Patient Teaching Instructions  1.) Apply Cold Compress over the affected area for 15-20 minutes, four times daily as tolerated.  2.) Elevate the affected site for the next 24-48 hours.   3.) Protect the site from direct sunlight.  4.) Return to clinic for evaluation 24 hours, 48 hours, and 1 week after occurrence.  5.) Follow-up appointments are scheduled with registered nurse at clinic to assess the site.   6.) Call the clinic at [phone] with any further questions or problems.   Upcoming Follow-up Appointments:  Appointment 24 hours after occurrence:  Date: 11/24/2023 Time: 10:30 am  Appointment 48 hours after occurrence:  Date: 11/25/2023 Time: 1:00 pm (will do with injection)  Appointment 1 week after occurrence:    Date: 11/30/2023  Time: 10:00 am (prior to lab appointment at 10:30)

## 2023-11-23 NOTE — Progress Notes (Signed)
 Extravasation/Infiltration Adverse Event Note  Date of Occurrence: 11/23/23  Time of Occurrence: 1505  Time of Discovery: 1505  Drug Involved: Cisplatin  Approximate Amount of Drug Infiltrated (ml): 6-9 ml  Approximate Amount of Drug Remaining in Syringe/Bag (mL): 10 mL  Route of Administration:   LDA Type: PIV  LDA Needle Gauge: 22 G  LDA Needle/Catheter Length: 1.00 in  LDA Location: Left  and Forearm   LDA Access/Placement Time: 0900  Number of Access Attempts: 1   Patient Complaints and Clinical Presentation:   Patient Symptoms: Swelling  Clinical Assessment:  Site Dimensions (mm): 5 cm  Site Color: Grade 0 - normal   Site Integrity: Grade 0 - Unbroken  Additional Assessments: 5 cm bulge noticed at IV site. No redness, per pt no pain at site.   Clinical Actions:   Time of IV disconnect: 1600 (following administration of NA Thiosulfate)   Attempted Aspiration Amount (ml): <1 ml   Heat/Cold Pack Initiated: Yes  If indicated, compress type: Cold Compress per Lenon Curt, RPH  Antidote Administered per Provider Order: Yes  If indicated, name of antidote administered: Sodium Thiosulfate , 3 ml per protocol. 1.6 ml Sodium Thiosulfate mixed with 8.4 ml Sterile water, mixed, administered via protocol.   Provider Notification and Instructions:   Provider Notified: Dr. Arbutus Ped  Time of Provider Notification: [phone]  Provider-Specific Instructions: Follow extravasation protocol   Patient Follow-Up Instructions and Return Appointments:  Follow-Up Instructions: Appointments schedule for 24 hours, 48 hours, and 1 week. Patient advised to monitor site, apply cold compress for approx. 15 minutes up to 4 times per day.   Discharge Checklist:      Extravasation Patient Teaching Instruction Sheet provided to patient: Yes   Appointments scheduled (24 hour, 48 hour, and 1 week follow-ups): Yes     Additional Information about the Extravasation/Infiltration  Event: see MAR for medication administrations

## 2023-11-24 ENCOUNTER — Inpatient Hospital Stay: Payer: HMO

## 2023-11-24 ENCOUNTER — Ambulatory Visit: Payer: HMO

## 2023-11-24 NOTE — Progress Notes (Signed)
 Extravasation and Infiltration Follow-Up Assessment Note:     Follow-Up Appointment (24-Hours After Occurrence)   Date: 11/24/23  Time: 1035  Name of RN Completing Assessment: Coralee Rud  Clinical Assessment:  Site Dimensions (mm): 5  Site Color: Grade 0 - normal   Site Integrity: Grade 0 - Unbroken  Additional Assessments:   Plan of Care:   Site looks really good! Patient has been putting ice in it for comfort. Will continue to monitor   Follow-Up Care Reviewed with Patient: yes  Upcoming Follow-Up Appointments Reviewed with Patient: yes

## 2023-11-25 ENCOUNTER — Inpatient Hospital Stay: Payer: HMO

## 2023-11-25 VITALS — BP 142/77 | HR 68 | Temp 97.7°F | Resp 18

## 2023-11-25 DIAGNOSIS — C3412 Malignant neoplasm of upper lobe, left bronchus or lung: Secondary | ICD-10-CM

## 2023-11-25 DIAGNOSIS — Z5111 Encounter for antineoplastic chemotherapy: Secondary | ICD-10-CM | POA: Diagnosis not present

## 2023-11-25 MED ORDER — PEGFILGRASTIM-CBQV 6 MG/0.6ML ~~LOC~~ SOSY
6.0000 mg | PREFILLED_SYRINGE | Freq: Once | SUBCUTANEOUS | Status: AC
  Administered 2023-11-25: 6 mg via SUBCUTANEOUS
  Filled 2023-11-25: qty 0.6

## 2023-11-25 NOTE — Patient Instructions (Signed)

## 2023-11-25 NOTE — Progress Notes (Signed)
 Extravasation and Infiltration Follow-Up Assessment Note:     Follow-Up Appointment (48-Hours After Occurrence)   Date: 11/25/23  Time: 1307   Name of RN Completing Assessment: Rae Roam  Clinical Assessment:  Site Dimensions (mm): 5cm  Site Color: Grade 0 - normal   Site Integrity: Grade 0 - Unbroken  Additional Assessments:   Plan of Care:   Pt applies ice to the area. Pt reports no pain or discomfort at site.     Follow-Up Care Reviewed with Patient: yes  Upcoming Follow-Up Appointments Reviewed with Patient: yes

## 2023-11-29 ENCOUNTER — Other Ambulatory Visit: Payer: Self-pay

## 2023-11-29 DIAGNOSIS — C3412 Malignant neoplasm of upper lobe, left bronchus or lung: Secondary | ICD-10-CM

## 2023-11-30 ENCOUNTER — Inpatient Hospital Stay: Payer: HMO | Attending: Internal Medicine

## 2023-11-30 ENCOUNTER — Other Ambulatory Visit: Payer: Self-pay | Admitting: Medical Oncology

## 2023-11-30 ENCOUNTER — Inpatient Hospital Stay: Admitting: Physician Assistant

## 2023-11-30 VITALS — BP 94/52 | HR 67 | Temp 98.5°F | Resp 18

## 2023-11-30 DIAGNOSIS — C3412 Malignant neoplasm of upper lobe, left bronchus or lung: Secondary | ICD-10-CM | POA: Insufficient documentation

## 2023-11-30 DIAGNOSIS — Z5111 Encounter for antineoplastic chemotherapy: Secondary | ICD-10-CM | POA: Insufficient documentation

## 2023-11-30 DIAGNOSIS — Z79899 Other long term (current) drug therapy: Secondary | ICD-10-CM | POA: Insufficient documentation

## 2023-11-30 DIAGNOSIS — F17210 Nicotine dependence, cigarettes, uncomplicated: Secondary | ICD-10-CM | POA: Diagnosis not present

## 2023-11-30 DIAGNOSIS — T80818S Extravasation of other vesicant agent, sequela: Secondary | ICD-10-CM | POA: Diagnosis not present

## 2023-11-30 DIAGNOSIS — Z5189 Encounter for other specified aftercare: Secondary | ICD-10-CM | POA: Insufficient documentation

## 2023-11-30 DIAGNOSIS — I878 Other specified disorders of veins: Secondary | ICD-10-CM

## 2023-11-30 LAB — CMP (CANCER CENTER ONLY)
ALT: 14 U/L (ref 0–44)
AST: 10 U/L — ABNORMAL LOW (ref 15–41)
Albumin: 3.5 g/dL (ref 3.5–5.0)
Alkaline Phosphatase: 71 U/L (ref 38–126)
Anion gap: 5 (ref 5–15)
BUN: 23 mg/dL (ref 8–23)
CO2: 30 mmol/L (ref 22–32)
Calcium: 8.2 mg/dL — ABNORMAL LOW (ref 8.9–10.3)
Chloride: 96 mmol/L — ABNORMAL LOW (ref 98–111)
Creatinine: 1.18 mg/dL (ref 0.61–1.24)
GFR, Estimated: >60 mL/min (ref >60)
Glucose, Bld: 123 mg/dL — ABNORMAL HIGH (ref 70–99)
Potassium: 3.7 mmol/L (ref 3.5–5.1)
Sodium: 131 mmol/L — ABNORMAL LOW (ref 135–145)
Total Bilirubin: 0.7 mg/dL (ref 0.0–1.2)
Total Protein: 5.8 g/dL — ABNORMAL LOW (ref 6.5–8.1)

## 2023-11-30 LAB — CBC WITH DIFFERENTIAL (CANCER CENTER ONLY)
Abs Immature Granulocytes: 0.02 10*3/uL (ref 0.00–0.07)
Basophils Absolute: 0 10*3/uL (ref 0.0–0.1)
Basophils Relative: 0 %
Eosinophils Absolute: 0.1 10*3/uL (ref 0.0–0.5)
Eosinophils Relative: 4 %
HCT: 43 % (ref 39.0–52.0)
Hemoglobin: 15.3 g/dL (ref 13.0–17.0)
Immature Granulocytes: 1 %
Lymphocytes Relative: 36 %
Lymphs Abs: 0.8 10*3/uL (ref 0.7–4.0)
MCH: 31.9 pg (ref 26.0–34.0)
MCHC: 35.6 g/dL (ref 30.0–36.0)
MCV: 89.6 fL (ref 80.0–100.0)
Monocytes Absolute: 0.6 10*3/uL (ref 0.1–1.0)
Monocytes Relative: 25 %
Neutro Abs: 0.8 10*3/uL — ABNORMAL LOW (ref 1.7–7.7)
Neutrophils Relative %: 34 %
Platelet Count: 152 10*3/uL (ref 150–400)
RBC: 4.8 MIL/uL (ref 4.22–5.81)
RDW: 12.5 % (ref 11.5–15.5)
WBC Count: 2.3 10*3/uL — ABNORMAL LOW (ref 4.0–10.5)
nRBC: 0 % (ref 0.0–0.2)

## 2023-11-30 LAB — MAGNESIUM: Magnesium: 2 mg/dL (ref 1.7–2.4)

## 2023-11-30 NOTE — Progress Notes (Signed)
 Port a cath order sent to [facility] for auth.

## 2023-11-30 NOTE — Progress Notes (Signed)
 Symptom Management Consult Note [facility]    Patient Care Team: [name], NP as PCP - General (Nurse Practitioner) Cherlyn Roberts, MD as Consulting Physician (Dermatology) Graylin Shiver, MD as Consulting Physician (Gastroenterology)    Name / MRN / DOB: Cameron [name]  [id number]  1950/12/23   Date of visit: 11/30/2023   Chief Complaint/Reason for visit: Extravasation check   Current Therapy: Cisplatin and docetaxel with Greggory Keen  Last treatment:  Day 1   Cycle 1 on 11/23/2023 with G-CSF on 11/25/2023.   ASSESSMENT & PLAN: Patient is a 73 y.o. male with oncologic history of  Stage IIA (T1c, N1, M0) Squamous cell Carcinoma followed by Dr. Arbutus Ped.  I have viewed most recent oncology note and lab work.    # Stage IIA (T1c, N1, M0) Squamous cell Carcinoma  - Next appointment with oncologist is 12/14/23 - Patient scheduled for port placement on 12/08/23   #Extravasation -Patient here for 1 week check s/p cisplatin extravasation on 11/23/23. Exam today is without signs of tissue necrosis or abscess. No skin sloughing. Compartments are soft.  - Will have patient return in 1 week for recheck patient knows to return sooner if worsening symptoms. - Discussed with oncologist Dr. Shirline Frees who agrees with plan.   Strict ED precautions discussed should symptoms worsen.   Heme/Onc History: Oncology History  Primary squamous cell carcinoma of bronchus of left upper lobe (HCC)  11/10/2023 Initial Diagnosis   Primary squamous cell carcinoma of bronchus of left upper lobe (HCC)   11/10/2023 Cancer Staging   Staging form: Lung, AJCC V9 - Clinical: Stage IIA (cT1c, cN1, cM0) - Signed by Si Gaul, MD on 11/10/2023 Method of lymph node assessment: Clinical   11/23/2023 -  Chemotherapy   Patient is on Treatment Plan : LUNG Cisplatin + Docetaxel q21d         Interval history-: Discussed the use of AI scribe software for clinical note transcription with the  patient, who gave verbal consent to proceed.   Cameron [name] is a 73 y.o. male with oncologic history as above presenting to [facility] today with chief complaint of extravasation check. Patient is accompanied by family member who provides additional history.  Patient states he has overall been doing well since the extravasation. He is hydrating with water and Gatorade. His appetite has decreased but he is still eating "enough." He did not realize he had an appointment today to have his arm checked, only thought he needed to have labs drawn.  Patient denies any pain in his left forearm. He applied ice for the first 3 days after extravasation. He notes the redness appeared. Denies fevers or chills.  ROS  All other systems are reviewed and are negative for acute change except as noted in the HPI.    No Known Allergies   Past Medical History:  Diagnosis Date   Adenomatous colon polyp 05/2009; 11/2014   2010:Tubular adenoma, no high grade dysplasia.Marland Kitchen  + Hyperplastic polyps. 2016: Tubular adenoma-rpt 5 yrs.   Cancer (HCC)    TURP for prostate cancer   COPD (chronic obstructive pulmonary disease) (HCC)    Coronary artery disease    Difficult intubation    H/O chronic gastritis 05/2009   EGD: no h.pylori,dysplasia,or evidence of malignancy   Hyperlipidemia    lovaza from prior PMD-stopped due to cost   Hypertension    Obesity, Class I, BMI 30.0-34.9 (see actual BMI) 04/23/2014   Tobacco dependence  Ventral hernia    small     Past Surgical History:  Procedure Laterality Date   APPENDECTOMY  09/28/1971   BIOPSY  08/15/2023   Procedure: BIOPSY;  Surgeon: Josephine Igo, DO;  Location: [facility];  Service: Pulmonary;;   BLADDER DIVERTICULECTOMY N/A 09/30/2022   Procedure: BLADDER DIVERTICULECTOMY;  Surgeon: Heloise Purpura, MD;  Location: [facility] ORS;  Service: Urology;  Laterality: N/A;   CARPAL TUNNEL RELEASE Left    CHOLECYSTECTOMY N/A 07/06/2018   Procedure: LAPAROSCOPIC CHOLECYSTECTOMY  WITH INTRAOPERATIVE CHOLANGIOGRAM;  Surgeon: Ovidio Kin, MD;  Location: [facility] OR;  Service: General;  Laterality: N/A;   COLONOSCOPY  09/27/2008   [facility]   CYSTOSCOPY N/A 09/30/2022   Procedure: Derinda Late;  Surgeon: Heloise Purpura, MD;  Location: [facility] ORS;  Service: Urology;  Laterality: N/A;   EYE SURGERY  2018   Cataracts removed only   FINGER SURGERY Left 12/2021   Left middle finger, laceration   HEMOSTASIS CONTROL  08/15/2023   Procedure: HEMOSTASIS CONTROL;  Surgeon: Josephine Igo, DO;  Location: [facility];  Service: Pulmonary;;   INTERCOSTAL NERVE BLOCK Left 10/03/2023   Procedure: INTERCOSTAL NERVE BLOCK;  Surgeon: Loreli Slot, MD;  Location[facility];  Service: Thoracic;  Laterality: Left;   LYMPH NODE BIOPSY Left 10/03/2023   Procedure: LYMPH NODE BIOPSY;  Surgeon: Loreli Slot, MD;  Location: [facility] OR;  Service: Thoracic;  Laterality: Left;   ROBOT ASSISTED LAPAROSCOPIC RADICAL PROSTATECTOMY N/A 09/30/2022   Procedure: XI ROBOTIC ASSISTED LAPAROSCOPIC RADICAL PROSTATECTOMY LEVEL 3;  Surgeon: Heloise Purpura, MD;  Location: [facility];  Service: Urology;  Laterality: N/A;  270 MINUTES NEEDED FOR CASE   STRABISMUS SURGERY  age 24 or 9   TRANSURETHRAL RESECTION OF PROSTATE N/A 07/08/2022   Procedure: TRANSURETHRAL RESECTION OF THE PROSTATE (TURP)/ CYSTOSCOPY;  Surgeon: Heloise Purpura, MD;  Location: [facility];  Service: Urology;  Laterality: N/A;   VIDEO BRONCHOSCOPY N/A 08/15/2023   Procedure: VIDEO BRONCHOSCOPY WITHOUT FLUORO;  Surgeon: Josephine Igo, DO;  Location: [facility];  Service: Pulmonary;  Laterality: N/A;    Social History   Socioeconomic History   Marital status: Married    Spouse name: Not on file   Number of children: 2   Years of education: Not on file   Highest education level: Not on file  Occupational History   Occupation: Retired    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   Works with entertainment companies to set up venues    Orig from [place], has lived in [place] since 1970.   Tobacco 80 pack-yr hx, ongoing as of 03/2014.   Rare alcohol.  No drug use except DISTANT use of marijuana.   Social Drivers of Corporate investment banker Strain: Low Risk  (11/21/2023)   Overall Financial Resource Strain (CARDIA)    Difficulty of Paying Living Expenses: Not hard at  all  Food Insecurity: No Food Insecurity (11/21/2023)   Hunger Vital Sign    Worried About Running Out of Food in the Last Year: Never true    Ran Out of Food in the Last Year: Never true  Transportation Needs: No Transportation Needs (11/21/2023)   PRAPARE - Administrator, Civil Service (Medical): No    Lack of Transportation (Non-Medical): No  Physical Activity: Sufficiently Active (11/21/2023)   Exercise Vital Sign    Days of Exercise per Week: 5 days    Minutes of Exercise per Session: 30 min  Stress: No Stress Concern Present (11/21/2023)   [employer] of Occupational Health - Occupational Stress Questionnaire    Feeling of Stress : Not at all  Social Connections: Moderately Integrated (11/21/2023)   Social Connection and Isolation Panel [NHANES]    Frequency of Communication with Friends and Family: More than three times a week    Frequency of Social Gatherings with Friends and Family: Twice a week    Attends Religious Services: More than 4 times per year    Active Member of [employer] or Organizations: No    Attends Tax inspector Meetings: Never    Marital Status: Married  Catering manager Violence: Not At Risk (11/21/2023)   Humiliation, Afraid, Rape, and Kick questionnaire    Fear of Current or Ex-Partner: No    Emotionally Abused: No    Physically Abused: No    Sexually Abused: No    Family History  Problem Relation Age of Onset   Diabetes Mother    Heart disease Mother    COPD Father    Hypertension Father    Hyperlipidemia Father    Heart attack Father    Dementia Father    Alcohol abuse Maternal Grandfather      Current Outpatient Medications:    acetaminophen (TYLENOL) 500 MG tablet, Take 1-2 tablets (500-1,000 mg total) by mouth every 6 (six) hours as needed., Disp: 30 tablet, Rfl: 0   amiodarone (PACERONE) 200 MG tablet, Take 1 tablet (200 mg total) by mouth 2 (two) times daily for 10 days, then decrease to 1 tablet (200 mg) daily., Disp: 60 tablet, Rfl: 1   Ascorbic Acid (VITAMIN C PO), Take 1 tablet by mouth in the morning., Disp: , Rfl:    atorvastatin (LIPITOR) 20 MG tablet, Take 1 tablet (20 mg total) by mouth daily., Disp: 90 tablet, Rfl: 1   benazepril (LOTENSIN) 40 MG tablet, Take 1 tablet (40 mg total) by mouth daily., Disp: 90 tablet, Rfl: 1   dexamethasone (DECADRON) 4 MG tablet, Take 2 tabs (8 mg) twice daily starting the day before docetaxel, then daily x 3 days after cisplatin.Take with food., Disp: 30 tablet, Rfl: 1   fluticasone (FLONASE) 50 MCG/ACT nasal spray, Place 1 spray into both nostrils daily., Disp: , Rfl:    fluticasone-salmeterol (ADVAIR DISKUS) 250-50 MCG/ACT AEPB, Inhale 1 puff into the lungs in the morning and at bedtime., Disp: 180 each, Rfl: 0   gabapentin (NEURONTIN) 300 MG capsule, Take 1 capsule (300 mg total) by mouth 2 (two) times daily., Disp: 90 capsule, Rfl: 2   hydrochlorothiazide (MICROZIDE) 12.5 MG capsule, Take 1 capsule (12.5 mg total) by mouth daily., Disp: 90 capsule, Rfl: 1   ibuprofen (ADVIL) 800 MG tablet, Take 800 mg by mouth every 8  (eight) hours as needed (pain.)., Disp: , Rfl:    MAGNESIUM PO, Take 1 capsule by mouth in the morning. Magnesium Citrate, Disp: ,  Rfl:    metoprolol tartrate (LOPRESSOR) 25 MG tablet, Take 1/2 tablet (12.5 mg total) by mouth 2 (two) times daily., Disp: 30 tablet, Rfl: 1   ondansetron (ZOFRAN) 8 MG tablet, Take 1 tablet (8 mg total) by mouth every 8 (eight) hours as needed for nausea or vomiting. Begin on the third day after cisplatin chemotherapy., Disp: 30 tablet, Rfl: 1   oxyCODONE (OXY IR/ROXICODONE) 5 MG immediate release tablet, Take 1-2 tablets (5-10 mg total) by mouth every 4 (four) hours as needed for moderate pain (pain score 4-6). (Patient not taking: Reported on 11/21/2023), Disp: 30 tablet, Rfl: 0   prochlorperazine (COMPAZINE) 10 MG tablet, Take 1 tablet (10 mg total) by mouth every 6 (six) hours as needed for nausea or vomiting., Disp: 30 tablet, Rfl: 1   valACYclovir (VALTREX) 1000 MG tablet, Take 1,000 mg by mouth 2 (two) times daily as needed (fever blisters/outbreaks)., Disp: , Rfl:   PHYSICAL EXAM: ECOG FS:1 - Symptomatic but completely ambulatory    Vitals:   11/30/23 1029  BP: (!) 94/52  Pulse: 67  Resp: 18  Temp: 98.5 F (36.9 C)  TempSrc: Tympanic  SpO2: 97%   Physical Exam Vitals and nursing note reviewed.  Constitutional:      Appearance: He is not ill-appearing or toxic-appearing.  HENT:     Head: Normocephalic.  Eyes:     Conjunctiva/sclera: Conjunctivae normal.  Cardiovascular:     Rate and Rhythm: Normal rate.     Pulses:          Radial pulses are 2+ on the right side and 2+ on the left side.  Pulmonary:     Effort: Pulmonary effort is normal.  Abdominal:     General: There is no distension.  Musculoskeletal:     Cervical back: Normal range of motion.     Comments: Soft compartments in LUE  Skin:    General: Skin is warm and dry.     Capillary Refill: Capillary refill takes less than 2 seconds.     Comments: Please see media below.   6 x 3.5  cm area of erythema on left forearm. Mild warmth. No wound. No streaking. No blisters. Non tender.  Neurological:     Mental Status: He is alert.        LABORATORY DATA: I have reviewed the data as listed    Latest Ref Rng & Units 11/23/2023    8:06 AM 11/10/2023    8:35 AM 10/25/2023    1:58 PM  CBC  WBC 4.0 - 10.5 K/uL 11.1  7.9  9.2   Hemoglobin 13.0 - 17.0 g/dL 56.2  13.0  86.5   Hematocrit 39.0 - 52.0 % 44.5  45.7  45.7   Platelets 150 - 400 K/uL 245  222  273         Latest Ref Rng & Units 11/23/2023    8:06 AM 11/10/2023    8:35 AM 10/25/2023    1:58 PM  CMP  Glucose 70 - 99 mg/dL 784  696  295   BUN 8 - 23 mg/dL 14  12  12    Creatinine 0.61 - 1.24 mg/dL 2.84  1.32  4.40   Sodium 135 - 145 mmol/L 131  136  134   Potassium 3.5 - 5.1 mmol/L 4.1  4.3  4.0   Chloride 98 - 111 mmol/L 95  100  97   CO2 22 - 32 mmol/L 26  33  32   Calcium 8.9 -  10.3 mg/dL 9.2  9.2  9.4   Total Protein 6.5 - 8.1 g/dL 6.8  6.7  6.7   Total Bilirubin 0.0 - 1.2 mg/dL 0.6  0.8  0.6   Alkaline Phos 38 - 126 U/L 84  83  92   AST 15 - 41 U/L 13  13  11    ALT 0 - 44 U/L 11  9  11         RADIOGRAPHIC STUDIES (from last 24 hours if applicable) I have personally reviewed the radiological images as listed and agreed with the findings in the report. No results found.      Visit Diagnosis: 1. Extravasation accident, sequela   2. Primary squamous cell carcinoma of upper lobe of left lung (HCC)      No orders of the defined types were placed in this encounter.   All questions were answered. The patient knows to call the clinic with any problems, questions or concerns. No barriers to learning was detected.  A total of more than 20 minutes were spent on this encounter with face-to-face time and non-face-to-face time, including preparing to see the patient, ordering tests and/or medications, counseling the patient and coordination of care as outlined above.    Thank you for allowing me to  participate in the care of this patient.    Shanon Ace, PA-C Department of Hematology/Oncology [facility] at [facility] Phone: [phone]  [phone]    11/30/2023 11:15 AM

## 2023-11-30 NOTE — Progress Notes (Signed)
 Extravasation and Infiltration Follow-Up Assessment Note:     Follow-Up Appointment (1-Week After Occurrence)   Date: 11/30/23  Time: 1050  Name of RN Completing Assessment: March Rummage  Clinical Assessment:  Site Dimensions (cm): 6 x 3.5  Site Color: Grade 2 - red  Site Integrity: Grade 0 - Unbroken  Additional Assessments: Site is red and warm to touch. No discomfort, swelling or blistering noted. This RN made Benedict Needy aware. Jae Dire PA-C assessed Pt and Pt's site.   Plan of Care:   Pt to follow up with Benedict Needy in [facility] in one week. Pt to apply warm compresses to area per Dr. Arbutus Ped.    Follow-Up Care Reviewed with Patient: yes  Upcoming Follow-Up Appointments Reviewed with Patient: yes

## 2023-12-01 ENCOUNTER — Telehealth: Payer: Self-pay | Admitting: *Deleted

## 2023-12-01 ENCOUNTER — Encounter: Payer: Self-pay | Admitting: *Deleted

## 2023-12-01 ENCOUNTER — Ambulatory Visit: Admitting: Physician Assistant

## 2023-12-01 DIAGNOSIS — T80818S Extravasation of other vesicant agent, sequela: Secondary | ICD-10-CM

## 2023-12-01 MED ORDER — HYDROCORTISONE 0.5 % EX OINT: 1.0000 | TOPICAL_OINTMENT | Freq: Two times a day (BID) | CUTANEOUS | 0 refills | Status: DC

## 2023-12-01 NOTE — Progress Notes (Signed)
 I connected with Redge Gainer Schamberger on 12/01/23 at  3:30 PM EST by telephone and verified that I am speaking with the correct person using two identifiers.   I discussed the limitations, risks, security and privacy concerns of performing an evaluation and management service by telemedicine and the availability of in-person appointments. I also discussed with the patient that there may be a patient responsible charge related to this service. The patient expressed understanding and agreed to proceed.   Other persons participating in the visit and their role in the encounter: patient's spouse   Patient's location: lobby of a medical practice  Provider's location: [facility] office       Symptom Management Consult note [facility]    Patient Care Team: [name], NP as PCP - General (Nurse Practitioner) Cherlyn Roberts, MD as Consulting Physician (Dermatology) Graylin Shiver, MD as Consulting Physician (Gastroenterology)    Name of the patient: Cameron [name]  [id number]  [birth year]   Date of visit: 12/01/2023    Chief complaint/ Reason for visit- redness on arm  Oncology History  Primary squamous cell carcinoma of bronchus of left upper lobe (HCC)  11/10/2023 Initial Diagnosis   Primary squamous cell carcinoma of bronchus of left upper lobe (HCC)   11/10/2023 Cancer Staging   Staging form: Lung, AJCC V9 - Clinical: Stage IIA (cT1c, cN1, cM0) - Signed by Si Gaul, MD on 11/10/2023 Method of lymph node assessment: Clinical   11/23/2023 -  Chemotherapy   Patient is on Treatment Plan : LUNG Cisplatin + Docetaxel q21d       Current Therapy: Cisplatin and Docetaxel  Interval history- KYROS SALZWEDEL is a 73 y.o. male with oncologic history as stated above contact with today's help and she discussed redness on his left arm.  Patient states when he woke up this morning he noticed there is an area of redness that live outside of the outlined area from yesterday's appointment.   He states the area is about the size of his thumbprint.  There is no associated pain.  Does not feel any warmth when touching it.  Patient denies there being any blisters or open lesions to the area. He did apply a warm compress to his arm yesterday.    ROS  All other systems are reviewed and are negative for acute change except as noted in the HPI.    No Known Allergies   Past Medical History:  Diagnosis Date   Adenomatous colon polyp 05/2009; 11/2014   2010:Tubular adenoma, no high grade dysplasia.Marland Kitchen  + Hyperplastic polyps. 2016: Tubular adenoma-rpt 5 yrs.   Cancer (HCC)    TURP for prostate cancer   COPD (chronic obstructive pulmonary disease) (HCC)    Coronary artery disease    Difficult intubation    H/O chronic gastritis 05/2009   EGD: no h.pylori,dysplasia,or evidence of malignancy   Hyperlipidemia    lovaza from prior PMD-stopped due to cost   Hypertension    Obesity, Class I, BMI 30.0-34.9 (see actual BMI) 04/23/2014   Tobacco dependence    Ventral hernia    small     Past Surgical History:  Procedure Laterality Date   APPENDECTOMY  09/28/1971   BIOPSY  08/15/2023   Procedure: BIOPSY;  Surgeon: Josephine Igo, DO;  Location: [facility];  Service: Pulmonary;;   BLADDER DIVERTICULECTOMY N/A 09/30/2022   Procedure: BLADDER DIVERTICULECTOMY;  Surgeon: Heloise Purpura, MD;  Location: [facility];  Service: Urology;  Laterality: N/A;   CARPAL  TUNNEL RELEASE Left    CHOLECYSTECTOMY N/A 07/06/2018   Procedure: LAPAROSCOPIC CHOLECYSTECTOMY WITH INTRAOPERATIVE CHOLANGIOGRAM;  Surgeon: Ovidio Kin, MD;  [facility];  Service: General;  Laterality: N/A;   COLONOSCOPY  09/27/2008   [facility]   CYSTOSCOPY N/A 09/30/2022   Procedure: Derinda Late;  Surgeon: Heloise Purpura, MD;  Location: [facility];  Service: Urology;  Laterality: N/A;   EYE SURGERY  2018   Cataracts removed only   FINGER SURGERY Left 12/2021   Left middle finger, laceration   HEMOSTASIS CONTROL   08/15/2023   Procedure: HEMOSTASIS CONTROL;  Surgeon: Josephine Igo, DO;  Location: [facility];  Service: Pulmonary;;   INTERCOSTAL NERVE BLOCK Left 10/03/2023   Procedure: INTERCOSTAL NERVE BLOCK;  Surgeon: Loreli Slot, MD;  Location: [facility] OR;  Service: Thoracic;  Laterality: Left;   LYMPH NODE BIOPSY Left 10/03/2023   Procedure: LYMPH NODE BIOPSY;  Surgeon: Loreli Slot, MD;  Location: [facility] OR;  Service: Thoracic;  Laterality: Left;   ROBOT ASSISTED LAPAROSCOPIC RADICAL PROSTATECTOMY N/A 09/30/2022   Procedure: XI ROBOTIC ASSISTED LAPAROSCOPIC RADICAL PROSTATECTOMY LEVEL 3;  Surgeon: Heloise Purpura, MD;  Location: [facility];  Service: Urology;  Laterality: N/A;  270 MINUTES NEEDED FOR CASE   STRABISMUS SURGERY  age 47 or 9   TRANSURETHRAL RESECTION OF PROSTATE N/A 07/08/2022   Procedure: TRANSURETHRAL RESECTION OF THE PROSTATE (TURP)/ CYSTOSCOPY;  Surgeon: Heloise Purpura, MD;  Location: [facility];  Service: Urology;  Laterality: N/A;   VIDEO BRONCHOSCOPY N/A 08/15/2023   Procedure: VIDEO BRONCHOSCOPY WITHOUT FLUORO;  Surgeon: Josephine Igo, DO;  Location: [facility];  Service: Pulmonary;  Laterality: N/A;    Social History   Socioeconomic History   Marital status: Married    Spouse name: Not on file   Number of children: 2   Years of education: Not on file   Highest education level: Not on file  Occupational History   Occupation: Retired    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   Works with entertainment companies to set up venues    Orig from [place], has lived in [place] since 1970.   Tobacco 80 pack-yr hx, ongoing as of 03/2014.   Rare alcohol.  No drug use except DISTANT use of marijuana.   Social Drivers of Corporate investment banker Strain: Low Risk  (11/21/2023)   Overall Financial Resource Strain (CARDIA)    Difficulty of Paying Living Expenses: Not hard at all  Food Insecurity: No Food Insecurity (11/21/2023)   Hunger Vital Sign    Worried About Running Out of Food in the Last Year: Never true    Ran Out of Food in the Last Year: Never true  Transportation Needs: No Transportation Needs (11/21/2023)   PRAPARE - Administrator, Civil Service (Medical): No    Lack of Transportation (Non-Medical): No  Physical Activity: Sufficiently  Active (11/21/2023)   Exercise Vital Sign    Days of Exercise per Week: 5 days    Minutes of Exercise per Session: 30 min  Stress: No Stress Concern Present (11/21/2023)   [employer] of Occupational Health - Occupational Stress Questionnaire    Feeling of Stress : Not at all  Social Connections: Moderately Integrated (11/21/2023)   Social Connection and Isolation Panel [NHANES]    Frequency of Communication with Friends and Family: More than three times a week    Frequency of Social Gatherings with Friends and Family: Twice a week    Attends Religious Services: More than 4 times per year    Active Member of [employer] or Organizations: No    Attends Banker Meetings: Never    Marital Status: Married  Catering manager Violence: Not At Risk (11/21/2023)   Humiliation, Afraid, Rape, and Kick questionnaire    Fear of Current or Ex-Partner: No    Emotionally Abused: No    Physically Abused: No    Sexually Abused: No    Family History  Problem Relation Age of Onset   Diabetes Mother    Heart disease Mother    COPD Father    Hypertension Father    Hyperlipidemia Father     Heart attack Father    Dementia Father    Alcohol abuse Maternal Grandfather      Current Outpatient Medications:    hydrocortisone ointment 0.5 %, Apply 1 Application topically 2 (two) times daily., Disp: 30 g, Rfl: 0   acetaminophen (TYLENOL) 500 MG tablet, Take 1-2 tablets (500-1,000 mg total) by mouth every 6 (six) hours as needed., Disp: 30 tablet, Rfl: 0   amiodarone (PACERONE) 200 MG tablet, Take 1 tablet (200 mg total) by mouth 2 (two) times daily for 10 days, then decrease to 1 tablet (200 mg) daily., Disp: 60 tablet, Rfl: 1   Ascorbic Acid (VITAMIN C PO), Take 1 tablet by mouth in the morning., Disp: , Rfl:    atorvastatin (LIPITOR) 20 MG tablet, Take 1 tablet (20 mg total) by mouth daily., Disp: 90 tablet, Rfl: 1   benazepril (LOTENSIN) 40 MG tablet, Take 1 tablet (40 mg total) by mouth daily., Disp: 90 tablet, Rfl: 1   dexamethasone (DECADRON) 4 MG tablet, Take 2 tabs (8 mg) twice daily starting the day before docetaxel, then daily x 3 days after cisplatin.Take with food., Disp: 30 tablet, Rfl: 1   fluticasone (FLONASE) 50 MCG/ACT nasal spray, Place 1 spray into both nostrils daily., Disp: , Rfl:    fluticasone-salmeterol (ADVAIR DISKUS) 250-50 MCG/ACT AEPB, Inhale 1 puff into the lungs in the morning and at bedtime., Disp: 180 each, Rfl: 0   gabapentin (NEURONTIN) 300 MG capsule, Take 1 capsule (300 mg total) by mouth 2 (two) times daily., Disp: 90 capsule, Rfl: 2   hydrochlorothiazide (MICROZIDE) 12.5 MG capsule, Take 1 capsule (12.5 mg total) by mouth daily., Disp: 90 capsule, Rfl: 1   ibuprofen (ADVIL) 800 MG tablet, Take 800 mg by mouth every 8 (eight) hours as needed (pain.)., Disp: , Rfl:    MAGNESIUM PO, Take 1 capsule by mouth in the morning. Magnesium Citrate, Disp: , Rfl:    metoprolol tartrate (LOPRESSOR) 25 MG tablet, Take 1/2 tablet (12.5 mg total) by mouth 2 (two) times daily., Disp: 30 tablet, Rfl: 1   ondansetron (ZOFRAN) 8 MG tablet, Take 1 tablet (8 mg total) by  mouth every 8 (eight) hours as needed for nausea or vomiting. Begin on  the third day after cisplatin chemotherapy., Disp: 30 tablet, Rfl: 1   oxyCODONE (OXY IR/ROXICODONE) 5 MG immediate release tablet, Take 1-2 tablets (5-10 mg total) by mouth every 4 (four) hours as needed for moderate pain (pain score 4-6). (Patient not taking: Reported on 11/21/2023), Disp: 30 tablet, Rfl: 0   prochlorperazine (COMPAZINE) 10 MG tablet, Take 1 tablet (10 mg total) by mouth every 6 (six) hours as needed for nausea or vomiting., Disp: 30 tablet, Rfl: 1   valACYclovir (VALTREX) 1000 MG tablet, Take 1,000 mg by mouth 2 (two) times daily as needed (fever blisters/outbreaks)., Disp: , Rfl:   PHYSICAL EXAM: ECOG FS:1 - Symptomatic but completely ambulatory   There were no vitals filed for this visit.  Patient speaking in clear sentences, no respiratory distress    LABORATORY DATA: I have reviewed the data as listed    Latest Ref Rng & Units 11/30/2023   11:06 AM 11/23/2023    8:06 AM 11/10/2023    8:35 AM  CBC  WBC 4.0 - 10.5 K/uL 2.3  11.1  7.9   Hemoglobin 13.0 - 17.0 g/dL 16.1  09.6  04.5   Hematocrit 39.0 - 52.0 % 43.0  44.5  45.7   Platelets 150 - 400 K/uL 152  245  222         Latest Ref Rng & Units 11/30/2023   11:06 AM 11/23/2023    8:06 AM 11/10/2023    8:35 AM  CMP  Glucose 70 - 99 mg/dL 409  811  914   BUN 8 - 23 mg/dL 23  14  12    Creatinine 0.61 - 1.24 mg/dL 7.82  9.56  2.13   Sodium 135 - 145 mmol/L 131  131  136   Potassium 3.5 - 5.1 mmol/L 3.7  4.1  4.3   Chloride 98 - 111 mmol/L 96  95  100   CO2 22 - 32 mmol/L 30  26  33   Calcium 8.9 - 10.3 mg/dL 8.2  9.2  9.2   Total Protein 6.5 - 8.1 g/dL 5.8  6.8  6.7   Total Bilirubin 0.0 - 1.2 mg/dL 0.7  0.6  0.8   Alkaline Phos 38 - 126 U/L 71  84  83   AST 15 - 41 U/L 10  13  13    ALT 0 - 44 U/L 14  11  9         RADIOGRAPHIC STUDIES: I have personally reviewed the radiological images as listed and agreed with the findings in the  report. No images are attached to the encounter. No results found.   ASSESSMENT & PLAN: Patient is a 73 y.o. male with oncologic history of stage IIA (T1c, N1, M0) Squamous cell carcinoma followed by Dr. Shirline Frees.  Extravasation Patient seen yesterday in office for the same.  The patient uploaded a picture of his arm and a new area of redness.  I do not see any blisters or wounds.  No skin sloughing.  The area of redness is separate from the outlined area, question if this is related.  Will go ahead and prescribe topical hydrocortisone ointment after discussion with Dr. Shirline Frees.  Patient knows to return to clinic or seek immediate medical evaluation should symptoms worsen.   Visit Diagnosis: 1. Extravasation accident, sequela      No orders of the defined types were placed in this encounter.   All questions were answered. The patient knows to call the clinic with any problems, questions or  concerns. No barriers to learning was detected.  Time spent with patient on telephone encounter: 10 minutes   Thank you for allowing me to participate in the care of this patient.    Shanon Ace, PA-C Department of Hematology/Oncology [facility] at [facility] Phone: [phone]  [phone]    12/01/2023 4:01 PM

## 2023-12-01 NOTE — Telephone Encounter (Signed)
 TCT to patient - in response to message that patient has another red spot on his left arm next to the area of extravasation seen yesterday during his appt in [facility]. Per patient and wife, spot is new, but not large. Namon Cirri, PA informed and she discussed with Dr. Arbutus Ped. Ms. Liston Alba asked if patient could send picture of arm via MyChart for evaluation and she will follow up with phone appt this afternoon. Ms.Scriven states she will try to send picture. Sent patient a MyChart message requesting picture.  Patient's wife sent picture - telephone appt scheduled for 3:30 today with [facility]

## 2023-12-05 ENCOUNTER — Other Ambulatory Visit: Payer: Self-pay | Admitting: Thoracic Surgery (Cardiothoracic Vascular Surgery)

## 2023-12-05 DIAGNOSIS — R911 Solitary pulmonary nodule: Secondary | ICD-10-CM

## 2023-12-06 ENCOUNTER — Ambulatory Visit
Admission: RE | Admit: 2023-12-06 | Discharge: 2023-12-06 | Disposition: A | Discharge status: Home / Self Care | Source: Ambulatory Visit | Attending: Thoracic Surgery (Cardiothoracic Vascular Surgery) | Admitting: Thoracic Surgery (Cardiothoracic Vascular Surgery)

## 2023-12-06 ENCOUNTER — Other Ambulatory Visit: Payer: HMO

## 2023-12-06 ENCOUNTER — Ambulatory Visit: Payer: HMO | Admitting: Physician Assistant

## 2023-12-06 ENCOUNTER — Ambulatory Visit (INDEPENDENT_AMBULATORY_CARE_PROVIDER_SITE_OTHER): Payer: Self-pay | Admitting: Thoracic Surgery (Cardiothoracic Vascular Surgery)

## 2023-12-06 VITALS — BP 142/72 | HR 70 | Resp 18 | Ht 71.0 in | Wt 209.0 lb

## 2023-12-06 DIAGNOSIS — R911 Solitary pulmonary nodule: Secondary | ICD-10-CM

## 2023-12-06 DIAGNOSIS — I700 Atherosclerosis of aorta: Secondary | ICD-10-CM | POA: Diagnosis not present

## 2023-12-06 DIAGNOSIS — Z902 Acquired absence of lung [part of]: Secondary | ICD-10-CM

## 2023-12-06 NOTE — Progress Notes (Signed)
 [address]       [name] [address]             [phone]     HPI: Mr. Kolakowski returns for follow-up after left upper lobectomy.  Stepehn Eckard is a 73 year old man with a history of tobacco abuse, COPD, hypertension, hyperlipidemia, obesity, coronary calcification, prostate cancer, squamous cell carcinoma of the skin, colon polyps, and a stage IIb squamous cell carcinoma of the lung.  75-pack-year history of smoking.  Had a low-dose CT for lung cancer screening which showed a 1.7 cm nodule in the left upper lobe.  It was hypermetabolic on PET and biopsy showed squamous cell carcinoma.  He underwent robotic assisted left upper lobectomy on 10/03/2023.  He went home on day 3.  He had a brief episode of atrial fibrillation and went home on amiodarone.  I saw him in the office on 10/20/2023.  He was doing well at that time.  In the interim since that visit he saw Dr. Arbutus Ped.  He has started chemotherapy.  He had his first cycle of chemotherapy 2 weeks ago.  Feels well today.  Denies any surgical pain.  No respiratory issues.  He does complain of some pain in his heels since starting chemo.  Past Medical History:  Diagnosis Date   Adenomatous colon polyp 05/2009; 11/2014   2010:Tubular adenoma, no high grade dysplasia.Marland Kitchen  + Hyperplastic polyps. 2016: Tubular adenoma-rpt 5 yrs.   Cancer (HCC)    TURP for prostate cancer   COPD (chronic obstructive pulmonary disease) (HCC)    Coronary artery disease    Difficult intubation    H/O chronic gastritis 05/2009   EGD: no h.pylori,dysplasia,or evidence of malignancy   Hyperlipidemia    lovaza from prior PMD-stopped due to cost   Hypertension    Obesity, Class I, BMI 30.0-34.9 (see actual BMI) 04/23/2014   Tobacco dependence    Ventral hernia    small    Current Outpatient Medications  Medication Sig Dispense Refill   acetaminophen (TYLENOL) 500 MG tablet Take 1-2 tablets (500-1,000 mg total) by mouth every 6 (six) hours as  needed. 30 tablet 0   Ascorbic Acid (VITAMIN C PO) Take 1 tablet by mouth in the morning.     atorvastatin (LIPITOR) 20 MG tablet Take 1 tablet (20 mg total) by mouth daily. 90 tablet 1   benazepril (LOTENSIN) 40 MG tablet Take 1 tablet (40 mg total) by mouth daily. 90 tablet 1   dexamethasone (DECADRON) 4 MG tablet Take 2 tabs (8 mg) twice daily starting the day before docetaxel, then daily x 3 days after cisplatin.Take with food. 30 tablet 1   fluticasone (FLONASE) 50 MCG/ACT nasal spray Place 1 spray into both nostrils daily.     fluticasone-salmeterol (ADVAIR DISKUS) 250-50 MCG/ACT AEPB Inhale 1 puff into the lungs in the morning and at bedtime. 180 each 0   gabapentin (NEURONTIN) 300 MG capsule Take 1 capsule (300 mg total) by mouth 2 (two) times daily. 90 capsule 2   hydrochlorothiazide (MICROZIDE) 12.5 MG capsule Take 1 capsule (12.5 mg total) by mouth daily. 90 capsule 1   hydrocortisone ointment 0.5 % Apply 1 Application topically 2 (two) times daily. 30 g 0   ibuprofen (ADVIL) 800 MG tablet Take 800 mg by mouth every 8 (eight) hours as needed (pain.).     MAGNESIUM PO Take 1 capsule by mouth in the morning. Magnesium Citrate     metoprolol tartrate (LOPRESSOR)  25 MG tablet Take 1/2 tablet (12.5 mg total) by mouth 2 (two) times daily. 30 tablet 1   ondansetron (ZOFRAN) 8 MG tablet Take 1 tablet (8 mg total) by mouth every 8 (eight) hours as needed for nausea or vomiting. Begin on the third day after cisplatin chemotherapy. 30 tablet 1   oxyCODONE (OXY IR/ROXICODONE) 5 MG immediate release tablet Take 1-2 tablets (5-10 mg total) by mouth every 4 (four) hours as needed for moderate pain (pain score 4-6). 30 tablet 0   prochlorperazine (COMPAZINE) 10 MG tablet Take 1 tablet (10 mg total) by mouth every 6 (six) hours as needed for nausea or vomiting. 30 tablet 1   valACYclovir (VALTREX) 1000 MG tablet Take 1,000 mg by mouth 2 (two) times daily as needed (fever blisters/outbreaks).      amiodarone (PACERONE) 200 MG tablet Take 1 tablet (200 mg total) by mouth 2 (two) times daily for 10 days, then decrease to 1 tablet (200 mg) daily. 60 tablet 1   No current facility-administered medications for this visit.    Physical Exam BP (!) 142/72   Pulse 70   Resp 18   Ht 5\' 11"  (1.803 m)   Wt 209 lb (94.8 kg)   SpO2 96%   BMI 29.38 kg/m  73 year old man in no acute distress Alert and oriented x 3 with no focal deficits Lungs slightly diminished at left base but otherwise clear Cardiac regular rate and rhythm Incisions well-healed  Diagnostic Tests: I personally reviewed his chest x-ray images.  Shows postoperative changes from left upper lobectomy.  Impression: Davian Hanshaw is a 73 year old man with a history of tobacco abuse, COPD, hypertension, hyperlipidemia, obesity, coronary calcification, prostate cancer, squamous cell carcinoma of the skin, colon polyps, and a stage IIb squamous cell carcinoma of the lung.  He underwent a robotic assisted left upper lobectomy on 10/03/2023.  Stage IIb squamous cell carcinoma of the lung-status post surgical resection.  Now undergoing adjuvant chemotherapy.  Will be followed by Dr. Arbutus Ped.  Postoperative atrial fibrillation-transient while in hospital.  Recommended he stop amiodarone after completing current prescription.  I would continue with the metoprolol 12.5 mg p.o. twice daily.  He is not having any surgical pain but says gabapentin seems to be helping his peripheral neuropathy.  He will continue that medication.  Plan: Discontinue amiodarone after current prescription runs out.  Continue metoprolol 12.5 mg p.o. twice daily. Continue gabapentin. Follow-up with Dr. Arbutus Ped I will be happy to see Mr. Knoop back anytime in the future if I can be of any further assistance with his care  Loreli Slot, MD [facility] [phone]

## 2023-12-07 ENCOUNTER — Ambulatory Visit: Payer: HMO

## 2023-12-07 ENCOUNTER — Other Ambulatory Visit: Payer: Self-pay | Admitting: Radiology

## 2023-12-07 ENCOUNTER — Inpatient Hospital Stay: Payer: HMO

## 2023-12-07 ENCOUNTER — Inpatient Hospital Stay (HOSPITAL_BASED_OUTPATIENT_CLINIC_OR_DEPARTMENT_OTHER): Admitting: Physician Assistant

## 2023-12-07 VITALS — BP 108/63 | HR 58 | Temp 98.2°F | Resp 14 | Wt 210.1 lb

## 2023-12-07 DIAGNOSIS — G629 Polyneuropathy, unspecified: Secondary | ICD-10-CM

## 2023-12-07 DIAGNOSIS — Z5111 Encounter for antineoplastic chemotherapy: Secondary | ICD-10-CM | POA: Diagnosis not present

## 2023-12-07 DIAGNOSIS — T80818S Extravasation of other vesicant agent, sequela: Secondary | ICD-10-CM | POA: Diagnosis not present

## 2023-12-07 DIAGNOSIS — C3412 Malignant neoplasm of upper lobe, left bronchus or lung: Secondary | ICD-10-CM | POA: Diagnosis not present

## 2023-12-07 LAB — CMP (CANCER CENTER ONLY)
ALT: 14 U/L (ref 0–44)
AST: 15 U/L (ref 15–41)
Albumin: 3.5 g/dL (ref 3.5–5.0)
Alkaline Phosphatase: 103 U/L (ref 38–126)
Anion gap: 5 (ref 5–15)
BUN: 12 mg/dL (ref 8–23)
CO2: 34 mmol/L — ABNORMAL HIGH (ref 22–32)
Calcium: 8.5 mg/dL — ABNORMAL LOW (ref 8.9–10.3)
Chloride: 98 mmol/L (ref 98–111)
Creatinine: 1.01 mg/dL (ref 0.61–1.24)
GFR, Estimated: >60 mL/min (ref >60)
Glucose, Bld: 100 mg/dL — ABNORMAL HIGH (ref 70–99)
Potassium: 3.8 mmol/L (ref 3.5–5.1)
Sodium: 137 mmol/L (ref 135–145)
Total Bilirubin: 0.3 mg/dL (ref 0.0–1.2)
Total Protein: 5.7 g/dL — ABNORMAL LOW (ref 6.5–8.1)

## 2023-12-07 LAB — CBC WITH DIFFERENTIAL (CANCER CENTER ONLY)
Abs Immature Granulocytes: 0.96 10*3/uL — ABNORMAL HIGH (ref 0.00–0.07)
Basophils Absolute: 0.1 10*3/uL (ref 0.0–0.1)
Basophils Relative: 1 %
Eosinophils Absolute: 0 10*3/uL (ref 0.0–0.5)
Eosinophils Relative: 0 %
HCT: 42 % (ref 39.0–52.0)
Hemoglobin: 14.3 g/dL (ref 13.0–17.0)
Immature Granulocytes: 6 %
Lymphocytes Relative: 11 %
Lymphs Abs: 1.9 10*3/uL (ref 0.7–4.0)
MCH: 31.3 pg (ref 26.0–34.0)
MCHC: 34 g/dL (ref 30.0–36.0)
MCV: 91.9 fL (ref 80.0–100.0)
Monocytes Absolute: 0.6 10*3/uL (ref 0.1–1.0)
Monocytes Relative: 3 %
Neutro Abs: 13.9 10*3/uL — ABNORMAL HIGH (ref 1.7–7.7)
Neutrophils Relative %: 79 %
Platelet Count: 201 10*3/uL (ref 150–400)
RBC: 4.57 MIL/uL (ref 4.22–5.81)
RDW: 13.2 % (ref 11.5–15.5)
WBC Count: 17.5 10*3/uL — ABNORMAL HIGH (ref 4.0–10.5)
nRBC: 0 % (ref 0.0–0.2)

## 2023-12-07 LAB — MAGNESIUM: Magnesium: 2 mg/dL (ref 1.7–2.4)

## 2023-12-07 NOTE — H&P (Signed)
 Chief Complaint: Request for image guided Port-A-Cath insertion for chemotherapy for squamous cell carcinoma of the lung  Referring Provider(s): Mohamed,Mohamed   Supervising Physician: Richarda Overlie  Patient Status: [facility] - Out-pt  History of Present Illness: Cameron [name] is a 73 y.o. male with history of tobacco use, COPD, hypertension, hyperlipidemia, obesity, prostate cancer, squamous cell carcinoma of the skin, colon polyps, squamous cell carcinoma of the lung, status post left upper lobectomy (10/03/2023).  During hospitalization for left upper lobectomy, experienced brief episode of atrial fibrillation, went home on amiodarone.  Patient has started chemotherapy, experienced extravasation on 11/23/2023, followed by oncology for this injury.  Request to IR for image guided Port-A-Cath insertion for continued chemotherapy treatments.   Patient is Full Code  Past Medical History:  Diagnosis Date   Adenomatous colon polyp 05/2009; 11/2014   2010:Tubular adenoma, no high grade dysplasia.Marland Kitchen  + Hyperplastic polyps. 2016: Tubular adenoma-rpt 5 yrs.   Cancer (HCC)    TURP for prostate cancer   COPD (chronic obstructive pulmonary disease) (HCC)    Coronary artery disease    Difficult intubation    H/O chronic gastritis 05/2009   EGD: no h.pylori,dysplasia,or evidence of malignancy   Hyperlipidemia    lovaza from prior PMD-stopped due to cost   Hypertension    Obesity, Class I, BMI 30.0-34.9 (see actual BMI) 04/23/2014   Tobacco dependence    Ventral hernia    small    Past Surgical History:  Procedure Laterality Date   APPENDECTOMY  09/28/1971   BIOPSY  08/15/2023   Procedure: BIOPSY;  Surgeon: Josephine Igo, DO;  Location: [facility];  Service: Pulmonary;;   BLADDER DIVERTICULECTOMY N/A 09/30/2022   Procedure: BLADDER DIVERTICULECTOMY;  Surgeon: Heloise Purpura, MD;  Location: [facility];  Service: Urology;  Laterality: N/A;   CARPAL TUNNEL RELEASE Left    CHOLECYSTECTOMY N/A  07/06/2018   Procedure: LAPAROSCOPIC CHOLECYSTECTOMY WITH INTRAOPERATIVE CHOLANGIOGRAM;  Surgeon: Ovidio Kin, MD;  Location: [facility] OR;  Service: General;  Laterality: N/A;   COLONOSCOPY  09/27/2008   [facility]   CYSTOSCOPY N/A 09/30/2022   Procedure: Derinda Late;  Surgeon: Heloise Purpura, MD;  Location: [facility] ORS;  Service: Urology;  Laterality: N/A;   EYE SURGERY  2018   Cataracts removed only   FINGER SURGERY Left 12/2021   Left middle finger, laceration   HEMOSTASIS CONTROL  08/15/2023   Procedure: HEMOSTASIS CONTROL;  Surgeon: Josephine Igo, DO;  Location: [facility];  Service: Pulmonary;;   INTERCOSTAL NERVE BLOCK Left 10/03/2023   Procedure: INTERCOSTAL NERVE BLOCK;  Surgeon: Loreli Slot, MD;  Location[facility] OR;  Service: Thoracic;  Laterality: Left;   LYMPH NODE BIOPSY Left 10/03/2023   Procedure: LYMPH NODE BIOPSY;  Surgeon: Loreli Slot, MD;  Location: [facility] OR;  Service: Thoracic;  Laterality: Left;   ROBOT ASSISTED LAPAROSCOPIC RADICAL PROSTATECTOMY N/A 09/30/2022   Procedure: XI ROBOTIC ASSISTED LAPAROSCOPIC RADICAL PROSTATECTOMY LEVEL 3;  Surgeon: Heloise Purpura, MD;  Location: [facility]S;  Service: Urology;  Laterality: N/A;  270 MINUTES NEEDED FOR CASE   STRABISMUS SURGERY  age 82 or 9   TRANSURETHRAL RESECTION OF PROSTATE N/A 07/08/2022   Procedure: TRANSURETHRAL RESECTION OF THE PROSTATE (TURP)/ CYSTOSCOPY;  Surgeon: Heloise Purpura, MD;  Location: [facility];  Service: Urology;  Laterality: N/A;   VIDEO BRONCHOSCOPY N/A 08/15/2023   Procedure: VIDEO BRONCHOSCOPY WITHOUT FLUORO;  Surgeon: Josephine Igo, DO;  Location: [facility];  Service: Pulmonary;  Laterality: N/A;  Allergies: Patient has no known allergies.  Medications: Prior to Admission medications   Medication Sig Start Date End Date Taking? Authorizing Provider  acetaminophen (TYLENOL) 500 MG tablet Take 1-2 tablets (500-1,000 mg total) by mouth every 6 (six) hours as needed. 10/06/23   Barrett, Rae Roam, PA-C  amiodarone (PACERONE) 200 MG tablet Take 1 tablet (200 mg total) by mouth 2 (two) times daily for 10 days, then decrease to 1 tablet (200 mg) daily. 10/06/23 11/25/23  Barrett, Rae Roam, PA-C  Ascorbic Acid (VITAMIN C PO) Take 1 tablet by mouth in the morning.    [provider]  atorvastatin (LIPITOR) 20 MG tablet Take 1 tablet (20 mg total) by mouth daily. 06/20/23   [name], NP  benazepril (LOTENSIN) 40 MG tablet Take 1 tablet (40 mg total) by mouth daily. 06/20/23   [name], NP  dexamethasone (DECADRON) 4 MG tablet Take 2 tabs (8 mg) twice daily starting the day before docetaxel, then daily x 3 days after cisplatin.Take with food. 11/10/23   Si Gaul, MD  fluticasone [facility]) 50 MCG/ACT nasal spray Place 1 spray into both nostrils daily.    [provider]  fluticasone-salmeterol (ADVAIR DISKUS) 250-50 MCG/ACT AEPB Inhale 1 puff into the lungs in the morning and at bedtime. 08/31/23   [name], NP  gabapentin (NEURONTIN) 300 MG capsule Take 1 capsule (300 mg total) by mouth 2 (two) times daily. 10/06/23 10/05/24  Barrett, Rae Roam, PA-C  hydrochlorothiazide (MICROZIDE) 12.5 MG capsule Take 1 capsule (12.5 mg total) by mouth daily. 06/20/23   [name], NP  hydrocortisone ointment 0.5 % Apply 1 Application topically 2 (two) times daily. 12/01/23   Walisiewicz, Yvonna Alanis E, PA-C  ibuprofen (ADVIL) 800 MG tablet Take 800 mg by mouth every 8 (eight) hours as needed (pain.).    [provider]  MAGNESIUM PO Take 1 capsule by mouth in the morning. Magnesium Citrate    [provider]  metoprolol tartrate (LOPRESSOR) 25 MG tablet Take 1/2 tablet (12.5 mg total) by mouth 2 (two) times daily. 10/21/23   Loreli Slot, MD  ondansetron (ZOFRAN) 8 MG tablet Take 1 tablet (8 mg total) by mouth every 8 (eight) hours as needed for nausea or vomiting. Begin on the third day after cisplatin chemotherapy. 11/10/23   Si Gaul, MD  oxyCODONE  (OXY IR/ROXICODONE) 5 MG immediate release tablet Take 1-2 tablets (5-10 mg total) by mouth every 4 (four) hours as needed for moderate pain (pain score 4-6). 10/06/23   Barrett, Erin R, PA-C  prochlorperazine (COMPAZINE) 10 MG tablet Take 1 tablet (10 mg total) by mouth every 6 (six) hours as needed for nausea or vomiting. 11/10/23   Si Gaul, MD  valACYclovir (VALTREX) 1000 MG tablet Take 1,000 mg by mouth 2 (two) times daily as needed (fever blisters/outbreaks). 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   Works with entertainment companies to set up venues    Orig from [place], has lived in [place] since 1970.   Tobacco 80 pack-yr hx, ongoing as of 03/2014.   Rare alcohol.  No drug use except DISTANT use of marijuana.   Social Drivers of Corporate investment banker Strain: Low Risk  (11/21/2023)   Overall Financial Resource Strain (CARDIA)    Difficulty of Paying  Living Expenses: Not hard at all  Food Insecurity: No Food Insecurity (11/21/2023)   Hunger Vital Sign    Worried About Running Out of Food in the Last Year: Never true    Ran Out of Food in the Last Year: Never true  Transportation Needs: No Transportation Needs (11/21/2023)   PRAPARE - Administrator, Civil Service (Medical): No    Lack of Transportation (Non-Medical): No  Physical Activity: Sufficiently Active (11/21/2023)   Exercise Vital Sign    Days of Exercise per Week: 5 days    Minutes of Exercise per Session: 30 min  Stress: No Stress Concern Present (11/21/2023)   [employer] of Occupational Health - Occupational Stress Questionnaire    Feeling of Stress : Not at all  Social Connections: Moderately Integrated (11/21/2023)   Social Connection and Isolation Panel [NHANES]    Frequency of Communication with Friends and Family: More than three times a week    Frequency of Social Gatherings with Friends and Family: Twice a week    Attends Religious Services: More than 4 times per year    Active Member of [employer] or Organizations: No    Attends Banker Meetings: Never    Marital Status: Married     Review of Systems  Vital Signs: There were no vitals taken for this visit.  Advance Care Plan: No documents on file   Physical Exam  Imaging: DG Chest 2 View Result Date: 12/06/2023 CLINICAL DATA:  Status post VATS on 09/20/2023 EXAM: CHEST - 2 VIEW COMPARISON:  Prior chest x-ray 10/19/2023 FINDINGS: Interval resolution of small left apical pneumothorax. Persistent elevation of the left hemidiaphragm. Cardiac and mediastinal contours are unchanged. Atherosclerotic calcification present in the transverse aorta. No focal airspace infiltrate or pulmonary edema. No large effusion. IMPRESSION: 1. Interval resolution of small left apical pneumothorax. 2. Chronic elevation of the left hemidiaphragm. 3. No acute cardiopulmonary process. Electronically Signed   By:  Malachy Moan M.D.   On: 12/06/2023 14:07    Labs:  CBC: Recent Labs    11/10/23 0835 11/23/23 0806 11/30/23 1106 12/07/23 1047  WBC 7.9 11.1* 2.3* 17.5*  HGB 15.7 15.3 15.3 14.3  HCT 45.7 44.5 43.0 42.0  PLT 222 245 152 201    COAGS: Recent Labs    09/29/23 1030  INR 1.1  APTT 35    BMP: Recent Labs  11/10/23 0835 11/23/23 0806 11/30/23 1106 12/07/23 1047  NA 136 131* 131* 137  K 4.3 4.1 3.7 3.8  CL 100 95* 96* 98  CO2 33* 26 30 34*  GLUCOSE 106* 220* 123* 100*  BUN 12 14 23 12   CALCIUM 9.2 9.2 8.2* 8.5*  CREATININE 1.02 0.97 1.18 1.01  GFRNONAA >60 >60 >60 >60    LIVER FUNCTION TESTS: Recent Labs    11/10/23 0835 11/23/23 0806 11/30/23 1106 12/07/23 1047  BILITOT 0.8 0.6 0.7 0.3  AST 13* 13* 10* 15  ALT 9 11 14 14   ALKPHOS 83 84 71 103  PROT 6.7 6.8 5.8* 5.7*  ALBUMIN 4.1 4.2 3.5 3.5    TUMOR MARKERS: No results for input(s): "AFPTM", "CEA", "CA199", "CHROMGRNA" in the last 8760 hours.  Assessment and Plan:  Cameron [name] is a 73 y.o. male with history of tobacco use, COPD, hypertension, hyperlipidemia, obesity, prostate cancer, squamous cell carcinoma of the skin, colon polyps, squamous cell carcinoma of the lung, status post left upper lobectomy (10/03/2023).  During hospitalization for left upper lobectomy, experienced brief episode of atrial fibrillation, went home on amiodarone.  Patient has started chemotherapy, experienced extravasation on 11/23/2023, followed by oncology for this injury.  Request to IR for image guided Port-A-Cath insertion for continued chemotherapy treatments. -Patient to be n.p.o. after midnight prior to procedure - Driver present postprocedure, available for 24 hours to supervise postprocedure given administration of moderate sedation - 12/07/2023 labs: Creatinine 1.01, hemoglobin 14.3, WBC 17.5, platelets 201 -Not on a blood thinner  Risks and benefits of image guided port-a-catheter placement was discussed with  the patient including, but not limited to bleeding, infection, pneumothorax, or fibrin sheath development and need for additional procedures.  All of the patient's questions were answered, patient is agreeable to proceed. Consent signed and in chart.  Thank you for allowing our service to participate in Cameron [name] 's care.    Electronically Signed: Carlton Adam, NP   12/07/2023, 4:52 PM     I spent a total of {New WUJW:[id number]} {New Out-Pt:[id number]}  {Established Out-Pt:[id number]} in face to face in clinical consultation, greater than 50% of which was counseling/coordinating care for ***   (A copy of this note was sent to the referring provider and the time of visit.)

## 2023-12-07 NOTE — Progress Notes (Signed)
 Symptom Management Consult Note [facility]    Patient Care Team: [name], NP as PCP - General (Nurse Practitioner) Cherlyn Roberts, MD as Consulting Physician (Dermatology) Graylin Shiver, MD as Consulting Physician (Gastroenterology)    Name / MRN / DOB: Cameron [name]  [id number]  1951/02/15   Date of visit: 12/07/2023   Chief Complaint/Reason for visit: Extravasation check    Current Therapy: Cisplatin and docetaxel with Greggory Keen   Last treatment:  Day 1   Cycle 1 on 11/23/23 with G-CSF on 11/25/23   ASSESSMENT & PLAN: Patient is a 73 y.o. male with oncologic history of stage IIA (T1c, N1, M0) squamous cell carcinoma followed by Dr. Arbutus Ped.  I have viewed most recent oncology note and lab work.    #Stage IIA (T1c, N1, M0) Squamous cell Carcinoma  - Next appointment with oncologist is 12/14/23 - Scheduled for port placement tomorrow. - Discussed lab results. Patient w/ weekly labs. CBC showing elevated WBC and ANC, likely from [facility]. No fevers or infectious symptoms.   #Extravasation -Patient here for 2 week check s/p cisplatin extravasation on 11/23/23  - Exam today without signs of tissue necrosis. No cellulitis -Patient was prescribed hydrocortisone on 12/01/23 to help with erythema on his forearm. The smaller area of erythema has resolved. Patient will discontinue topical steroid as it seems to be ineffective to the extravasation site. Patient and spouse will continue to closely monitor his arm.   #Neuropathy  -Grade 1. Patient will continue to monitor symptoms. -Encouraged patient to discuss this with oncologist at upcoming visit.    Strict ED precautions discussed should symptoms worsen.   Heme/Onc History: Oncology History  Primary squamous cell carcinoma of bronchus of left upper lobe (HCC)  11/10/2023 Initial Diagnosis   Primary squamous cell carcinoma of bronchus of left upper lobe (HCC)   11/10/2023 Cancer Staging   Staging  form: Lung, AJCC V9 - Clinical: Stage IIA (cT1c, cN1, cM0) - Signed by Si Gaul, MD on 11/10/2023 Method of lymph node assessment: Clinical   11/23/2023 -  Chemotherapy   Patient is on Treatment Plan : LUNG Cisplatin + Docetaxel q21d         Interval history-: Discussed the use of AI scribe software for clinical note transcription with the patient, who gave verbal consent to proceed.   Cameron [name] is a 73 y.o. male with oncologic history as above presenting to [facility] today with chief complaint of extravasation check.  Patient is accompanied by spouse who provides additional history.  Patient states the area on his arm is overall unchanged.  It is still red in color.  He has not developed any blisters or open wounds.  No peeling skin.  He had a second area of redness on his arm that was discussed during a recent telephone visit.  Patient has been applying hydrocortisone to both areas of redness and a smaller one has resolved.  He does not think the hydrocortisone has helped the original area though. He also reports sensation of neuropathy that is new.  It is located in both of his heels.  Patient states he only notices it at night when he is sleeping.  He is still able to walk and perform his activities of daily living without difficulty.  Spouse has been applying a neuropathy cream to his heels.  His appetite is decreased however he does comment that he is enjoying salty foods currently.   ROS  All other systems are  reviewed and are negative for acute change except as noted in the HPI.    No Known Allergies   Past Medical History:  Diagnosis Date   Adenomatous colon polyp 05/2009; 11/2014   2010:Tubular adenoma, no high grade dysplasia.Marland Kitchen  + Hyperplastic polyps. 2016: Tubular adenoma-rpt 5 yrs.   Cancer (HCC)    TURP for prostate cancer   COPD (chronic obstructive pulmonary disease) (HCC)    Coronary artery disease    Difficult intubation    H/O chronic gastritis 05/2009   EGD:  no h.pylori,dysplasia,or evidence of malignancy   Hyperlipidemia    lovaza from prior PMD-stopped due to cost   Hypertension    Obesity, Class I, BMI 30.0-34.9 (see actual BMI) 04/23/2014   Tobacco dependence    Ventral hernia    small     Past Surgical History:  Procedure Laterality Date   APPENDECTOMY  09/28/1971   BIOPSY  08/15/2023   Procedure: BIOPSY;  Surgeon: Josephine Igo, DO;  Location: [facility];  Service: Pulmonary;;   BLADDER DIVERTICULECTOMY N/A 09/30/2022   Procedure: BLADDER DIVERTICULECTOMY;  Surgeon: Heloise Purpura, MD;  Location: [facility];  Service: Urology;  Laterality: N/A;   CARPAL TUNNEL RELEASE Left    CHOLECYSTECTOMY N/A 07/06/2018   Procedure: LAPAROSCOPIC CHOLECYSTECTOMY WITH INTRAOPERATIVE CHOLANGIOGRAM;  Surgeon: Ovidio Kin, MD;  Location: [facility] OR;  Service: General;  Laterality: N/A;   COLONOSCOPY  09/27/2008   [facility]   CYSTOSCOPY N/A 09/30/2022   Procedure: Derinda Late;  Surgeon: Heloise Purpura, MD;  Location: [facility];  Service: Urology;  Laterality: N/A;   EYE SURGERY  2018   Cataracts removed only   FINGER SURGERY Left 12/2021   Left middle finger, laceration   HEMOSTASIS CONTROL  08/15/2023   Procedure: HEMOSTASIS CONTROL;  Surgeon: Josephine Igo, DO;  Location: [facility];  Service: Pulmonary;;   INTERCOSTAL NERVE BLOCK Left 10/03/2023   Procedure: INTERCOSTAL NERVE BLOCK;  Surgeon: Loreli Slot, MD;  Location: [facility] OR;  Service: Thoracic;  Laterality: Left;   LYMPH NODE BIOPSY Left 10/03/2023   Procedure: LYMPH NODE BIOPSY;  Surgeon: Loreli Slot, MD;  Location: [facility] OR;  Service: Thoracic;  Laterality: Left;   ROBOT ASSISTED LAPAROSCOPIC RADICAL PROSTATECTOMY N/A 09/30/2022   Procedure: XI ROBOTIC ASSISTED LAPAROSCOPIC RADICAL PROSTATECTOMY LEVEL 3;  Surgeon: Heloise Purpura, MD;  Location: [facility];  Service: Urology;  Laterality: N/A;  270 MINUTES NEEDED FOR CASE   STRABISMUS SURGERY  age 4 or 9   TRANSURETHRAL RESECTION  OF PROSTATE N/A 07/08/2022   Procedure: TRANSURETHRAL RESECTION OF THE PROSTATE (TURP)/ CYSTOSCOPY;  Surgeon: Heloise Purpura, MD;  Location: [facility];  Service: Urology;  Laterality: N/A;   VIDEO BRONCHOSCOPY N/A 08/15/2023   Procedure: VIDEO BRONCHOSCOPY WITHOUT FLUORO;  Surgeon: Josephine Igo, DO;  Location: [facility];  Service: Pulmonary;  Laterality: N/A;    Social History   Socioeconomic History   Marital status: Married    Spouse name: Not on file   Number of children: 2   Years of education: Not on file   Highest education level: Not on file  Occupational History   Occupation: Retired    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   Works with entertainment companies to set up venues    Orig from [place], has lived in [place] since 1970.   Tobacco 80 pack-yr hx, ongoing as of 03/2014.   Rare alcohol.  No drug use except DISTANT use of marijuana.   Social Drivers of Corporate investment banker Strain: Low Risk  (11/21/2023)   Overall Financial Resource Strain (CARDIA)    Difficulty of Paying Living Expenses: Not hard at all  Food Insecurity: No Food Insecurity (11/21/2023)   Hunger Vital Sign    Worried About Running Out of Food in the Last Year: Never true    Ran Out of Food in the Last Year: Never true  Transportation Needs: No Transportation Needs (11/21/2023)   PRAPARE - Administrator, Civil Service (Medical): No    Lack of Transportation  (Non-Medical): No  Physical Activity: Sufficiently Active (11/21/2023)   Exercise Vital Sign    Days of Exercise per Week: 5 days    Minutes of Exercise per Session: 30 min  Stress: No Stress Concern Present (11/21/2023)   [employer] of Occupational Health - Occupational Stress Questionnaire    Feeling of Stress : Not at all  Social Connections: Moderately Integrated (11/21/2023)   Social Connection and Isolation Panel [NHANES]    Frequency of Communication with Friends and Family: More than three times a week    Frequency of Social Gatherings with Friends and Family: Twice a week    Attends Religious Services: More than 4 times per year    Active Member of [employer] or Organizations: No    Attends Banker Meetings: Never    Marital Status: Married  Catering manager Violence: Not At Risk (11/21/2023)   Humiliation, Afraid, Rape, and Kick questionnaire    Fear of Current or Ex-Partner: No    Emotionally Abused: No    Physically Abused: No    Sexually Abused: No    Family History  Problem Relation Age of Onset   Diabetes Mother    Heart disease Mother    COPD Father    Hypertension Father    Hyperlipidemia Father    Heart attack Father    Dementia Father    Alcohol abuse Maternal Grandfather      Current Outpatient Medications:    acetaminophen (TYLENOL) 500 MG tablet, Take 1-2 tablets (500-1,000 mg total) by mouth every 6 (six) hours as needed., Disp: 30 tablet, Rfl: 0   amiodarone (PACERONE) 200 MG tablet, Take 1 tablet (200 mg total) by mouth 2 (two) times daily for 10 days, then decrease to 1 tablet (200 mg) daily., Disp: 60 tablet, Rfl: 1   Ascorbic Acid (VITAMIN C PO), Take 1 tablet by mouth in the morning., Disp: , Rfl:    atorvastatin (LIPITOR) 20 MG tablet, Take 1 tablet (20 mg total) by mouth daily., Disp: 90 tablet, Rfl: 1   benazepril (LOTENSIN) 40 MG tablet, Take 1 tablet (40 mg total) by mouth daily., Disp: 90 tablet, Rfl: 1   dexamethasone  (DECADRON) 4 MG tablet, Take 2 tabs (8 mg) twice daily starting the day before docetaxel, then daily x 3 days after cisplatin.Take with food., Disp: 30  tablet, Rfl: 1   fluticasone (FLONASE) 50 MCG/ACT nasal spray, Place 1 spray into both nostrils daily., Disp: , Rfl:    fluticasone-salmeterol (ADVAIR DISKUS) 250-50 MCG/ACT AEPB, Inhale 1 puff into the lungs in the morning and at bedtime., Disp: 180 each, Rfl: 0   gabapentin (NEURONTIN) 300 MG capsule, Take 1 capsule (300 mg total) by mouth 2 (two) times daily., Disp: 90 capsule, Rfl: 2   hydrochlorothiazide (MICROZIDE) 12.5 MG capsule, Take 1 capsule (12.5 mg total) by mouth daily., Disp: 90 capsule, Rfl: 1   hydrocortisone ointment 0.5 %, Apply 1 Application topically 2 (two) times daily., Disp: 30 g, Rfl: 0   ibuprofen (ADVIL) 800 MG tablet, Take 800 mg by mouth every 8 (eight) hours as needed (pain.)., Disp: , Rfl:    MAGNESIUM PO, Take 1 capsule by mouth in the morning. Magnesium Citrate, Disp: , Rfl:    metoprolol tartrate (LOPRESSOR) 25 MG tablet, Take 1/2 tablet (12.5 mg total) by mouth 2 (two) times daily., Disp: 30 tablet, Rfl: 1   ondansetron (ZOFRAN) 8 MG tablet, Take 1 tablet (8 mg total) by mouth every 8 (eight) hours as needed for nausea or vomiting. Begin on the third day after cisplatin chemotherapy., Disp: 30 tablet, Rfl: 1   oxyCODONE (OXY IR/ROXICODONE) 5 MG immediate release tablet, Take 1-2 tablets (5-10 mg total) by mouth every 4 (four) hours as needed for moderate pain (pain score 4-6)., Disp: 30 tablet, Rfl: 0   prochlorperazine (COMPAZINE) 10 MG tablet, Take 1 tablet (10 mg total) by mouth every 6 (six) hours as needed for nausea or vomiting., Disp: 30 tablet, Rfl: 1   valACYclovir (VALTREX) 1000 MG tablet, Take 1,000 mg by mouth 2 (two) times daily as needed (fever blisters/outbreaks)., Disp: , Rfl:   PHYSICAL EXAM: ECOG FS:1 - Symptomatic but completely ambulatory    Vitals:   12/07/23 1126  BP: 108/63  Pulse: (!) 58   Resp: 14  Temp: 98.2 F (36.8 C)  TempSrc: Temporal  SpO2: 97%  Weight: 210 lb 1.6 oz (95.3 kg)   Physical Exam Vitals and nursing note reviewed.  Constitutional:      Appearance: He is not ill-appearing or toxic-appearing.  HENT:     Head: Normocephalic.  Eyes:     Conjunctiva/sclera: Conjunctivae normal.  Cardiovascular:     Rate and Rhythm: Normal rate.     Pulses:          Radial pulses are 2+ on the left side.  Pulmonary:     Effort: Pulmonary effort is normal.  Abdominal:     General: There is no distension.  Musculoskeletal:     Cervical back: Normal range of motion.  Skin:    General: Skin is warm and dry.     Comments: Please see media below  Neurological:     Mental Status: He is alert.        LABORATORY DATA: I have reviewed the data as listed    Latest Ref Rng & Units 12/07/2023   10:47 AM 11/30/2023   11:06 AM 11/23/2023    8:06 AM  CBC  WBC 4.0 - 10.5 K/uL 17.5  2.3  11.1   Hemoglobin 13.0 - 17.0 g/dL 16.1  09.6  04.5   Hematocrit 39.0 - 52.0 % 42.0  43.0  44.5   Platelets 150 - 400 K/uL 201  152  245         Latest Ref Rng & Units 12/07/2023   10:47 AM 11/30/2023  11:06 AM 11/23/2023    8:06 AM  CMP  Glucose 70 - 99 mg/dL 604  540  981   BUN 8 - 23 mg/dL 12  23  14    Creatinine 0.61 - 1.24 mg/dL 1.91  4.78  2.95   Sodium 135 - 145 mmol/L 137  131  131   Potassium 3.5 - 5.1 mmol/L 3.8  3.7  4.1   Chloride 98 - 111 mmol/L 98  96  95   CO2 22 - 32 mmol/L 34  30  26   Calcium 8.9 - 10.3 mg/dL 8.5  8.2  9.2   Total Protein 6.5 - 8.1 g/dL 5.7  5.8  6.8   Total Bilirubin 0.0 - 1.2 mg/dL 0.3  0.7  0.6   Alkaline Phos 38 - 126 U/L 103  71  84   AST 15 - 41 U/L 15  10  13    ALT 0 - 44 U/L 14  14  11         RADIOGRAPHIC STUDIES (from last 24 hours if applicable) I have personally reviewed the radiological images as listed and agreed with the findings in the report. No results found.       Visit Diagnosis: 1. Extravasation accident,  sequela   2. Primary squamous cell carcinoma of bronchus of left upper lobe (HCC)   3. Neuropathy      No orders of the defined types were placed in this encounter.   All questions were answered. The patient knows to call the clinic with any problems, questions or concerns. No barriers to learning was detected.  A total of more than 30 minutes were spent on this encounter with face-to-face time and non-face-to-face time, including preparing to see the patient, ordering tests and/or medications, counseling the patient and coordination of care as outlined above.    Thank you for allowing me to participate in the care of this patient.    Shanon Ace, PA-C Department of Hematology/Oncology [facility] Phone: [phone]  [phone]    12/07/2023 3:08 PM

## 2023-12-08 ENCOUNTER — Other Ambulatory Visit: Payer: Self-pay

## 2023-12-08 ENCOUNTER — Ambulatory Visit (HOSPITAL_COMMUNITY)
Admission: RE | Admit: 2023-12-08 | Discharge: 2023-12-08 | Disposition: A | Discharge status: Home / Self Care | Source: Ambulatory Visit | Attending: Internal Medicine | Admitting: Internal Medicine

## 2023-12-08 ENCOUNTER — Encounter (HOSPITAL_COMMUNITY): Payer: Self-pay

## 2023-12-08 DIAGNOSIS — E669 Obesity, unspecified: Secondary | ICD-10-CM | POA: Insufficient documentation

## 2023-12-08 DIAGNOSIS — Z8249 Family history of ischemic heart disease and other diseases of the circulatory system: Secondary | ICD-10-CM | POA: Diagnosis not present

## 2023-12-08 DIAGNOSIS — I4891 Unspecified atrial fibrillation: Secondary | ICD-10-CM | POA: Insufficient documentation

## 2023-12-08 DIAGNOSIS — E785 Hyperlipidemia, unspecified: Secondary | ICD-10-CM | POA: Diagnosis not present

## 2023-12-08 DIAGNOSIS — C3490 Malignant neoplasm of unspecified part of unspecified bronchus or lung: Secondary | ICD-10-CM | POA: Diagnosis not present

## 2023-12-08 DIAGNOSIS — F17210 Nicotine dependence, cigarettes, uncomplicated: Secondary | ICD-10-CM | POA: Diagnosis not present

## 2023-12-08 DIAGNOSIS — C3412 Malignant neoplasm of upper lobe, left bronchus or lung: Secondary | ICD-10-CM | POA: Insufficient documentation

## 2023-12-08 DIAGNOSIS — Z79899 Other long term (current) drug therapy: Secondary | ICD-10-CM | POA: Diagnosis not present

## 2023-12-08 DIAGNOSIS — Z8546 Personal history of malignant neoplasm of prostate: Secondary | ICD-10-CM | POA: Insufficient documentation

## 2023-12-08 DIAGNOSIS — I10 Essential (primary) hypertension: Secondary | ICD-10-CM | POA: Insufficient documentation

## 2023-12-08 DIAGNOSIS — Z6829 Body mass index (BMI) 29.0-29.9, adult: Secondary | ICD-10-CM | POA: Insufficient documentation

## 2023-12-08 DIAGNOSIS — Z902 Acquired absence of lung [part of]: Secondary | ICD-10-CM | POA: Insufficient documentation

## 2023-12-08 DIAGNOSIS — I878 Other specified disorders of veins: Secondary | ICD-10-CM | POA: Diagnosis not present

## 2023-12-08 DIAGNOSIS — J449 Chronic obstructive pulmonary disease, unspecified: Secondary | ICD-10-CM | POA: Insufficient documentation

## 2023-12-08 HISTORY — PX: IR IMAGING GUIDED PORT INSERTION: IMG5740

## 2023-12-08 MED ORDER — LIDOCAINE-EPINEPHRINE 1 %-1:100000 IJ SOLN
20.0000 mL | Freq: Once | INTRAMUSCULAR | Status: AC
  Administered 2023-12-08: 20 mL via INTRADERMAL

## 2023-12-08 MED ORDER — SODIUM CHLORIDE 0.9 % IV SOLN: INTRAVENOUS | Status: DC

## 2023-12-08 MED ORDER — FENTANYL CITRATE (PF) 100 MCG/2ML IJ SOLN
INTRAMUSCULAR | Status: AC
  Filled 2023-12-08: qty 2

## 2023-12-08 MED ORDER — LIDOCAINE-EPINEPHRINE 1 %-1:100000 IJ SOLN
INTRAMUSCULAR | Status: AC
  Filled 2023-12-08: qty 1

## 2023-12-08 MED ORDER — HEPARIN SOD (PORK) LOCK FLUSH 100 UNIT/ML IV SOLN
500.0000 [IU] | Freq: Once | INTRAVENOUS | Status: AC
  Administered 2023-12-08: 500 [IU] via INTRAVENOUS

## 2023-12-08 MED ORDER — HEPARIN SOD (PORK) LOCK FLUSH 100 UNIT/ML IV SOLN
INTRAVENOUS | Status: AC
  Filled 2023-12-08: qty 5

## 2023-12-08 MED ORDER — MIDAZOLAM HCL 2 MG/2ML IJ SOLN
INTRAMUSCULAR | Status: AC | PRN
  Administered 2023-12-08 (×2): 1 mg via INTRAVENOUS

## 2023-12-08 MED ORDER — FENTANYL CITRATE (PF) 100 MCG/2ML IJ SOLN
INTRAMUSCULAR | Status: AC | PRN
  Administered 2023-12-08 (×2): 50 ug via INTRAVENOUS

## 2023-12-08 MED ORDER — MIDAZOLAM HCL 2 MG/2ML IJ SOLN
INTRAMUSCULAR | Status: AC
  Filled 2023-12-08: qty 2

## 2023-12-08 NOTE — Procedures (Signed)
 Interventional Radiology Procedure Note  Procedure: Single Lumen Power Port Placement    Access:  Right internal jugular vein  Findings: Catheter tip positioned at cavoatrial junction. Port is ready for immediate use.   Complications: None  EBL: < 10 mL  Recommendations:  - Ok to shower in 24 hours - Do not submerge for 7 days - Routine line care    Marliss Coots, MD

## 2023-12-08 NOTE — Discharge Instructions (Signed)

## 2023-12-09 ENCOUNTER — Ambulatory Visit: Payer: HMO

## 2023-12-12 DIAGNOSIS — B009 Herpesviral infection, unspecified: Secondary | ICD-10-CM | POA: Diagnosis not present

## 2023-12-12 DIAGNOSIS — Z85828 Personal history of other malignant neoplasm of skin: Secondary | ICD-10-CM | POA: Diagnosis not present

## 2023-12-13 ENCOUNTER — Other Ambulatory Visit (HOSPITAL_COMMUNITY): Payer: Self-pay

## 2023-12-13 MED FILL — Fosaprepitant Dimeglumine For IV Infusion 150 MG (Base Eq): INTRAVENOUS | Qty: 5 | Status: AC

## 2023-12-14 ENCOUNTER — Other Ambulatory Visit (HOSPITAL_COMMUNITY): Payer: Self-pay

## 2023-12-14 ENCOUNTER — Other Ambulatory Visit: Payer: Self-pay

## 2023-12-14 ENCOUNTER — Inpatient Hospital Stay: Payer: HMO

## 2023-12-14 ENCOUNTER — Inpatient Hospital Stay: Payer: HMO | Admitting: Internal Medicine

## 2023-12-14 VITALS — BP 107/61 | HR 70 | Temp 98.2°F | Resp 17 | Ht 71.0 in | Wt 213.6 lb

## 2023-12-14 DIAGNOSIS — C3412 Malignant neoplasm of upper lobe, left bronchus or lung: Secondary | ICD-10-CM

## 2023-12-14 DIAGNOSIS — Z5111 Encounter for antineoplastic chemotherapy: Secondary | ICD-10-CM | POA: Diagnosis not present

## 2023-12-14 LAB — CMP (CANCER CENTER ONLY)
ALT: 13 U/L (ref 0–44)
AST: 12 U/L — ABNORMAL LOW (ref 15–41)
Albumin: 3.9 g/dL (ref 3.5–5.0)
Alkaline Phosphatase: 100 U/L (ref 38–126)
Anion gap: 8 (ref 5–15)
BUN: 16 mg/dL (ref 8–23)
CO2: 27 mmol/L (ref 22–32)
Calcium: 9 mg/dL (ref 8.9–10.3)
Chloride: 99 mmol/L (ref 98–111)
Creatinine: 1.02 mg/dL (ref 0.61–1.24)
GFR, Estimated: >60 mL/min (ref >60)
Glucose, Bld: 214 mg/dL — ABNORMAL HIGH (ref 70–99)
Potassium: 4.4 mmol/L (ref 3.5–5.1)
Sodium: 134 mmol/L — ABNORMAL LOW (ref 135–145)
Total Bilirubin: 0.4 mg/dL (ref 0.0–1.2)
Total Protein: 6.4 g/dL — ABNORMAL LOW (ref 6.5–8.1)

## 2023-12-14 LAB — CBC WITH DIFFERENTIAL (CANCER CENTER ONLY)
Abs Immature Granulocytes: 0.72 10*3/uL — ABNORMAL HIGH (ref 0.00–0.07)
Basophils Absolute: 0.1 10*3/uL (ref 0.0–0.1)
Basophils Relative: 0 %
Eosinophils Absolute: 0.1 10*3/uL (ref 0.0–0.5)
Eosinophils Relative: 0 %
HCT: 41 % (ref 39.0–52.0)
Hemoglobin: 14.1 g/dL (ref 13.0–17.0)
Immature Granulocytes: 3 %
Lymphocytes Relative: 5 %
Lymphs Abs: 1.3 10*3/uL (ref 0.7–4.0)
MCH: 31.8 pg (ref 26.0–34.0)
MCHC: 34.4 g/dL (ref 30.0–36.0)
MCV: 92.3 fL (ref 80.0–100.0)
Monocytes Absolute: 0.7 10*3/uL (ref 0.1–1.0)
Monocytes Relative: 3 %
Neutro Abs: 23.5 10*3/uL — ABNORMAL HIGH (ref 1.7–7.7)
Neutrophils Relative %: 89 %
Platelet Count: 424 10*3/uL — ABNORMAL HIGH (ref 150–400)
RBC: 4.44 MIL/uL (ref 4.22–5.81)
RDW: 13.1 % (ref 11.5–15.5)
WBC Count: 26.4 10*3/uL — ABNORMAL HIGH (ref 4.0–10.5)
nRBC: 0 % (ref 0.0–0.2)

## 2023-12-14 LAB — MAGNESIUM: Magnesium: 2 mg/dL (ref 1.7–2.4)

## 2023-12-14 MED ORDER — POTASSIUM CHLORIDE IN NACL 20-0.9 MEQ/L-% IV SOLN
Freq: Once | INTRAVENOUS | Status: AC
Start: 2023-12-14 — End: 2023-12-14
  Filled 2023-12-14: qty 1000

## 2023-12-14 MED ORDER — HEPARIN SOD (PORK) LOCK FLUSH 100 UNIT/ML IV SOLN
500.0000 [IU] | Freq: Once | INTRAVENOUS | Status: AC | PRN
  Administered 2023-12-14: 500 [IU]

## 2023-12-14 MED ORDER — SODIUM CHLORIDE 0.9 % IV SOLN
75.0000 mg/m2 | Freq: Once | INTRAVENOUS | Status: AC
  Administered 2023-12-14: 167 mg via INTRAVENOUS
  Filled 2023-12-14: qty 167

## 2023-12-14 MED ORDER — DEXAMETHASONE SODIUM PHOSPHATE 10 MG/ML IJ SOLN
10.0000 mg | Freq: Once | INTRAMUSCULAR | Status: AC
Start: 2023-12-14 — End: 2023-12-14
  Administered 2023-12-14: 10 mg via INTRAVENOUS
  Filled 2023-12-14: qty 1

## 2023-12-14 MED ORDER — SODIUM CHLORIDE 0.9% FLUSH
10.0000 mL | INTRAVENOUS | Status: DC | PRN
  Administered 2023-12-14: 10 mL

## 2023-12-14 MED ORDER — LIDOCAINE-PRILOCAINE 2.5-2.5 % EX CREA
TOPICAL_CREAM | CUTANEOUS | 0 refills | Status: AC
  Filled 2023-12-14 – 2023-12-16 (×2): qty 30, 30d supply, fill #0

## 2023-12-14 MED ORDER — PALONOSETRON HCL INJECTION 0.25 MG/5ML
0.2500 mg | Freq: Once | INTRAVENOUS | Status: AC
  Administered 2023-12-14: 0.25 mg via INTRAVENOUS
  Filled 2023-12-14: qty 5

## 2023-12-14 MED ORDER — SODIUM CHLORIDE 0.9 % IV SOLN
150.0000 mg | Freq: Once | INTRAVENOUS | Status: AC
  Administered 2023-12-14: 150 mg via INTRAVENOUS
  Filled 2023-12-14: qty 150

## 2023-12-14 MED ORDER — SODIUM CHLORIDE 0.9 % IV SOLN: INTRAVENOUS | Status: DC

## 2023-12-14 MED ORDER — MAGNESIUM SULFATE 2 GM/50ML IV SOLN
2.0000 g | Freq: Once | INTRAVENOUS | Status: AC
  Administered 2023-12-14: 2 g via INTRAVENOUS
  Filled 2023-12-14: qty 50

## 2023-12-14 MED ORDER — DOCETAXEL CHEMO INJECTION 160 MG/16ML
75.0000 mg/m2 | Freq: Once | INTRAVENOUS | Status: AC
  Administered 2023-12-14: 160 mg via INTRAVENOUS
  Filled 2023-12-14: qty 16

## 2023-12-14 NOTE — Progress Notes (Signed)
 [facility] [phone]   [phone]  OFFICE PROGRESS NOTE  [name], NP [address] [place] [address]  DIAGNOSIS: Stage IIA (T1c, N1, M0) Squamous cell Carcinoma diagnosed in January of 2025. Diagnosed with NSCLC, squamous cell carcinoma, stage IIB due to lymph node involvement and tumor size (2.9 cm).  PD-L1 TPS was 0%  PRIOR THERAPY: Status post left upper lobectomy with lymph node dissection on October 03, 2023.   CURRENT THERAPY: Systemic chemotherapy with cisplatin 75 Mg/M2 and docetaxel 75 Mg/M2 with Neulasta support every 3 weeks.  First dose November 16, 2023.  INTERVAL HISTORY: Cameron [name] 73 y.o. male returns to the clinic today for follow-up visit accompanied by his wife. Discussed the use of AI scribe software for clinical note transcription with the patient, who gave verbal consent to proceed.  History of Present Illness   Cameron [name] is a 73 year old male with squamous cell carcinoma who presents for follow-up. He is accompanied by his wife.  He is undergoing chemotherapy for squamous cell carcinoma, having had a left upper lobectomy in 2025. He is currently receiving cisplatin and docetaxel. Since starting chemotherapy three weeks ago, he has experienced hair loss and a change of taste in his mouth, which makes food taste terrible. Despite these side effects, he has gained three pounds since December 07, 2023, and currently weighs 213.6 pounds. He attributes this to his wife's cooking, as he rarely eats out. He experiences fatigue, particularly after the first treatment, where he felt very tired for about a week and a half. However, the last three days have been better. He has not been frequently sick, having vomited only once, and his wife has been proactive in preventing sickness by placing a trash can by the bed.  He reports neuropathy in his feet, which has been improving, currently on Neurontin.  He is on a half  dose of a medication for atrial fibrillation, but he has no refills and is awaiting further instructions from his cardiologist.  He had a port placed last Thursday and is awaiting a prescription for Enbrel cream, which he was advised to start using two weeks post-procedure. His recent lab work shows a high white blood cell count due to steroid use and injections, but other results are pending.  His wife has been managing his diet to address low protein levels by incorporating more red meats.       MEDICAL HISTORY: Past Medical History:  Diagnosis Date   Adenomatous colon polyp 05/2009; 11/2014   2010:Tubular adenoma, no high grade dysplasia.Marland Kitchen  + Hyperplastic polyps. 2016: Tubular adenoma-rpt 5 yrs.   Cancer (HCC)    TURP for prostate cancer   COPD (chronic obstructive pulmonary disease) (HCC)    Coronary artery disease    Difficult intubation    H/O chronic gastritis 05/2009   EGD: no h.pylori,dysplasia,or evidence of malignancy   Hyperlipidemia    lovaza from prior PMD-stopped due to cost   Hypertension    Obesity, Class I, BMI 30.0-34.9 (see actual BMI) 04/23/2014   Tobacco dependence    Ventral hernia    small    ALLERGIES:  has no known allergies.  MEDICATIONS:  Current Outpatient Medications  Medication Sig Dispense Refill   acetaminophen (TYLENOL) 500 MG tablet Take 1-2 tablets (500-1,000 mg total) by mouth every 6 (six) hours as needed. 30 tablet 0   amiodarone (PACERONE) 200 MG tablet Take 1 tablet (  200 mg total) by mouth 2 (two) times daily for 10 days, then decrease to 1 tablet (200 mg) daily. 60 tablet 1   Ascorbic Acid (VITAMIN C PO) Take 1 tablet by mouth in the morning.     atorvastatin (LIPITOR) 20 MG tablet Take 1 tablet (20 mg total) by mouth daily. 90 tablet 1   benazepril (LOTENSIN) 40 MG tablet Take 1 tablet (40 mg total) by mouth daily. 90 tablet 1   dexamethasone (DECADRON) 4 MG tablet Take 2 tabs (8 mg) twice daily starting the day before docetaxel, then  daily x 3 days after cisplatin.Take with food. 30 tablet 1   fluticasone (FLONASE) 50 MCG/ACT nasal spray Place 1 spray into both nostrils daily.     fluticasone-salmeterol (ADVAIR DISKUS) 250-50 MCG/ACT AEPB Inhale 1 puff into the lungs in the morning and at bedtime. 180 each 0   gabapentin (NEURONTIN) 300 MG capsule Take 1 capsule (300 mg total) by mouth 2 (two) times daily. 90 capsule 2   hydrochlorothiazide (MICROZIDE) 12.5 MG capsule Take 1 capsule (12.5 mg total) by mouth daily. 90 capsule 1   hydrocortisone ointment 0.5 % Apply 1 Application topically 2 (two) times daily. 30 g 0   ibuprofen (ADVIL) 800 MG tablet Take 800 mg by mouth every 8 (eight) hours as needed (pain.).     MAGNESIUM PO Take 1 capsule by mouth in the morning. Magnesium Citrate     metoprolol tartrate (LOPRESSOR) 25 MG tablet Take 1/2 tablet (12.5 mg total) by mouth 2 (two) times daily. 30 tablet 1   ondansetron (ZOFRAN) 8 MG tablet Take 1 tablet (8 mg total) by mouth every 8 (eight) hours as needed for nausea or vomiting. Begin on the third day after cisplatin chemotherapy. 30 tablet 1   oxyCODONE (OXY IR/ROXICODONE) 5 MG immediate release tablet Take 1-2 tablets (5-10 mg total) by mouth every 4 (four) hours as needed for moderate pain (pain score 4-6). 30 tablet 0   prochlorperazine (COMPAZINE) 10 MG tablet Take 1 tablet (10 mg total) by mouth every 6 (six) hours as needed for nausea or vomiting. 30 tablet 1   valACYclovir (VALTREX) 1000 MG tablet Take 1,000 mg by mouth 2 (two) times daily as needed (fever blisters/outbreaks).     No current facility-administered medications for this visit.    SURGICAL HISTORY:  Past Surgical History:  Procedure Laterality Date   APPENDECTOMY  09/28/1971   BIOPSY  08/15/2023   Procedure: BIOPSY;  Surgeon: Josephine Igo, DO;  Location: [facility];  Service: Pulmonary;;   BLADDER DIVERTICULECTOMY N/A 09/30/2022   Procedure: BLADDER DIVERTICULECTOMY;  Surgeon: Heloise Purpura, MD;   Location: [facility];  Service: Urology;  Laterality: N/A;   CARPAL TUNNEL RELEASE Left    CHOLECYSTECTOMY N/A 07/06/2018   Procedure: LAPAROSCOPIC CHOLECYSTECTOMY WITH INTRAOPERATIVE CHOLANGIOGRAM;  Surgeon: Ovidio Kin, MD;  Location: [facility] OR;  Service: General;  Laterality: N/A;   COLONOSCOPY  09/27/2008   [facility]   CYSTOSCOPY N/A 09/30/2022   Procedure: Derinda Late;  Surgeon: Heloise Purpura, MD;  Location: [facility];  Service: Urology;  Laterality: N/A;   EYE SURGERY  2018   Cataracts removed only   FINGER SURGERY Left 12/2021   Left middle finger, laceration   HEMOSTASIS CONTROL  08/15/2023   Procedure: HEMOSTASIS CONTROL;  Surgeon: Josephine Igo, DO;  Location: [facility];  Service: Pulmonary;;   INTERCOSTAL NERVE BLOCK Left 10/03/2023   Procedure: INTERCOSTAL NERVE BLOCK;  Surgeon: Loreli Slot, MD;  Location:  [facility] OR;  Service: Thoracic;  Laterality: Left;   IR IMAGING GUIDED PORT INSERTION  12/08/2023   LYMPH NODE BIOPSY Left 10/03/2023   Procedure: LYMPH NODE BIOPSY;  Surgeon: Loreli Slot, MD;  Location: [facility];  Service: Thoracic;  Laterality: Left;   ROBOT ASSISTED LAPAROSCOPIC RADICAL PROSTATECTOMY N/A 09/30/2022   Procedure: XI ROBOTIC ASSISTED LAPAROSCOPIC RADICAL PROSTATECTOMY LEVEL 3;  Surgeon: Heloise Purpura, MD;  Location: [facility];  Service: Urology;  Laterality: N/A;  270 MINUTES NEEDED FOR CASE   STRABISMUS SURGERY  age 74 or 9   TRANSURETHRAL RESECTION OF PROSTATE N/A 07/08/2022   Procedure: TRANSURETHRAL RESECTION OF THE PROSTATE (TURP)/ CYSTOSCOPY;  Surgeon: Heloise Purpura, MD;  Location: [facility];  Service: Urology;  Laterality: N/A;   VIDEO BRONCHOSCOPY N/A 08/15/2023   Procedure: VIDEO BRONCHOSCOPY WITHOUT FLUORO;  Surgeon: Josephine Igo, DO;  Location: [facility];  Service: Pulmonary;  Laterality: N/A;    REVIEW OF SYSTEMS:  Constitutional: positive for fatigue Eyes: negative Ears, nose, mouth, throat, and face: negative Respiratory:  negative Cardiovascular: negative Gastrointestinal: negative Genitourinary:negative Integument/breast: positive for alopecia Hematologic/lymphatic: negative Musculoskeletal:negative Neurological: negative Behavioral/Psych: negative Endocrine: negative Allergic/Immunologic: negative   PHYSICAL EXAMINATION: General appearance: alert, cooperative, fatigued, and no distress Head: Normocephalic, without obvious abnormality, atraumatic Neck: no adenopathy, no JVD, supple, symmetrical, trachea midline, and thyroid not enlarged, symmetric, no tenderness/mass/nodules Lymph nodes: Cervical, supraclavicular, and axillary nodes normal. Resp: clear to auscultation bilaterally Back: symmetric, no curvature. ROM normal. No CVA tenderness. Cardio: regular rate and rhythm, S1, S2 normal, no murmur, click, rub or gallop GI: soft, non-tender; bowel sounds normal; no masses,  no organomegaly Extremities: extremities normal, atraumatic, no cyanosis or edema Neurologic: Alert and oriented X 3, normal strength and tone. Normal symmetric reflexes. Normal coordination and gait  ECOG PERFORMANCE STATUS: 1 - Symptomatic but completely ambulatory  Blood pressure 107/61, pulse 70, temperature 98.2 F (36.8 C), temperature source Temporal, resp. rate 17, height 5\' 11"  (1.803 m), weight 213 lb 9.6 oz (96.9 kg), SpO2 100%.  LABORATORY DATA: Lab Results  Component Value Date   WBC 26.4 (H) 12/14/2023   HGB 14.1 12/14/2023   HCT 41.0 12/14/2023   MCV 92.3 12/14/2023   PLT 424 (H) 12/14/2023      Chemistry      Component Value Date/Time   NA 137 12/07/2023 1047   K 3.8 12/07/2023 1047   CL 98 12/07/2023 1047   CO2 34 (H) 12/07/2023 1047   BUN 12 12/07/2023 1047   CREATININE 1.01 12/07/2023 1047      Component Value Date/Time   CALCIUM 8.5 (L) 12/07/2023 1047   ALKPHOS 103 12/07/2023 1047   AST 15 12/07/2023 1047   ALT 14 12/07/2023 1047   BILITOT 0.3 12/07/2023 1047       RADIOGRAPHIC  STUDIES: IR IMAGING GUIDED PORT INSERTION Result Date: 12/08/2023 INDICATION: 73 year old male with history of lung cancer requiring central venous access for chemotherapy administration. EXAM: IMPLANTED PORT A CATH PLACEMENT WITH ULTRASOUND AND FLUOROSCOPIC GUIDANCE COMPARISON:  None Available. MEDICATIONS: None. ANESTHESIA/SEDATION: Moderate (conscious) sedation was employed during this procedure. A total of Versed 2 mg and Fentanyl 100 mcg was administered intravenously. Moderate Sedation Time: 15 minutes. The patient's level of consciousness and vital signs were monitored continuously by radiology nursing throughout the procedure under my direct supervision. CONTRAST:  None FLUOROSCOPY TIME:  One mGy COMPLICATIONS: None immediate. PROCEDURE: The procedure, risks, benefits, and alternatives were explained to the patient. Questions regarding the procedure were encouraged  and answered. The patient understands and consents to the procedure. The right neck and chest were prepped with chlorhexidine in a sterile fashion, and a sterile drape was applied covering the operative field. Maximum barrier sterile technique with sterile gowns and gloves were used for the procedure. A timeout was performed prior to the initiation of the procedure. Ultrasound was used to examine the jugular vein which was compressible and free of internal echoes. A skin marker was used to demarcate the planned venotomy and port pocket incision sites. Local anesthesia was provided to these sites and the subcutaneous tunnel track with 1% lidocaine with 1:100,000 epinephrine. A small incision was created at the jugular access site and blunt dissection was performed of the subcutaneous tissues. Under ultrasound guidance, the jugular vein was accessed with a 21 ga micropuncture needle and an 0.018" wire was inserted to the superior vena cava. Real-time ultrasound guidance was utilized for vascular access including the acquisition of a permanent  ultrasound image documenting patency of the accessed vessel. A 5 Fr micopuncture set was then used, through which a 0.035" Rosen wire was passed under fluoroscopic guidance into the inferior vena cava. An 8 Fr dilator was then placed over the wire. A subcutaneous port pocket was then created along the upper chest wall utilizing a combination of sharp and blunt dissection. The pocket was irrigated with sterile saline, packed with gauze, and observed for hemorrhage. A single lumen clear view power injectable port was chosen for placement. The 8 Fr catheter was tunneled from the port pocket site to the venotomy incision. The port was placed in the pocket. The external catheter was trimmed to appropriate length. The dilator was exchanged for an 8 Fr peel-away sheath under fluoroscopic guidance. The catheter was then placed through the sheath and the sheath was removed. Final catheter positioning was confirmed and documented with a fluoroscopic spot radiograph. The port was accessed with a Huber needle, aspirated, and flushed with heparinized saline. The deep dermal layer of the port pocket incision was closed with interrupted 3-0 Vicryl suture. The skin was opposed with a running subcuticular 4-0 Monocryl suture. Dermabond was then placed over the port pocket and neck incisions. The patient tolerated the procedure well without immediate post procedural complication. FINDINGS: After catheter placement, the tip lies within the superior cavoatrial junction. The catheter aspirates and flushes normally and is ready for immediate use. IMPRESSION: Successful placement of a power injectable Port-A-Cath via the right internal jugular vein. The catheter is ready for immediate use. Marliss Coots, MD [facility] Electronically Signed   By: Marliss Coots M.D.   On: 12/08/2023 15:43   DG Chest 2 View Result Date: 12/06/2023 CLINICAL DATA:  Status post VATS on 09/20/2023  EXAM: CHEST - 2 VIEW COMPARISON:  Prior chest x-ray 10/19/2023 FINDINGS: Interval resolution of small left apical pneumothorax. Persistent elevation of the left hemidiaphragm. Cardiac and mediastinal contours are unchanged. Atherosclerotic calcification present in the transverse aorta. No focal airspace infiltrate or pulmonary edema. No large effusion. IMPRESSION: 1. Interval resolution of small left apical pneumothorax. 2. Chronic elevation of the left hemidiaphragm. 3. No acute cardiopulmonary process. Electronically Signed   By: Malachy Moan M.D.   On: 12/06/2023 14:07    ASSESSMENT AND PLAN: This is a very pleasant 73 years old white male with Stage IIA (T1c, N1, M0) Squamous cell Carcinoma diagnosed in January of 2025. Diagnosed with NSCLC, squamous cell carcinoma, stage IIB due to lymph node involvement and tumor  size (2.9 cm). Status post left upper lobectomy with lymph node dissection on October 03, 2023.  He has negative PD-L1 expression. The patient is currently undergoing adjuvant systemic chemotherapy with cisplatin 75 Mg/M2 and docetaxel 75 Mg/M2 with Neulasta support status post 1 cycle.  He tolerated the first cycle of his treatment fairly well except for the alopecia and mild fatigue.     Squamous cell carcinoma of the lung Undergoing chemotherapy with cisplatin and docetaxel. Completed one cycle and is halfway through the regimen. Reports side effects including alopecia, altered taste, and fatigue, but no significant weight loss. Neuropathy in the feet is improving. Tolerating treatment well overall. Lab work shows leukocytosis due to steroid use and injection, with pending magnesium and chemistry results. Post-chemotherapy follow-up includes a scan one month after the last treatment and regular follow-ups every four to six months. - Continue chemotherapy with cisplatin and docetaxel - Manage side effects symptomatically - Prescribe Emla cream for use two weeks post-port  placement - Monitor lab work, including pending magnesium and chemistry results - Schedule a scan one month after the last chemotherapy treatment - Plan follow-up visits every four to six months with scans  Peripheral neuropathy Neuropathy in feet is improving. Using Neurontin. - Continue use of Neurontin for neuropathy management  Atrial fibrillation Currently on metoprolol and gabapentin. Advised to discontinue Pacerone by thoracic surgeon. Cardiac medications managed by family doctor. - Discontinue Pacerone as advised by thoracic surgeon - Continue metoprolol and gabapentin - Coordinate with family doctor for management of cardiac medications   The patient was advised to call immediately if he has any concerning symptoms in the interval. The patient voices understanding of current disease status and treatment options and is in agreement with the current care plan.  All questions were answered. The patient knows to call the clinic with any problems, questions or concerns. We can certainly see the patient much sooner if necessary.  The total time spent in the appointment was 30 minutes.  Disclaimer: This note was dictated with voice recognition software. Similar sounding words can inadvertently be transcribed and may not be corrected upon review.

## 2023-12-14 NOTE — Patient Instructions (Signed)
 [facility] - A DEPT OF [facility]  Discharge Instructions: Thank you for choosing [facility] to provide your oncology and hematology care.   If you have a lab appointment with the Cancer Center, please go directly to the Cancer Center and check in at the registration area.   Wear comfortable clothing and clothing appropriate for easy access to any Portacath or PICC line.   We strive to give you quality time with your provider. You may need to reschedule your appointment if you arrive late (15 or more minutes).  Arriving late affects you and other patients whose appointments are after yours.  Also, if you miss three or more appointments without notifying the office, you may be dismissed from the clinic at the provider's discretion.      For prescription refill requests, have your pharmacy contact our office and allow 72 hours for refills to be completed.    Today you received the following chemotherapy and/or immunotherapy agents: docetaxel, cisplatin      To help prevent nausea and vomiting after your treatment, we encourage you to take your nausea medication as directed.  BELOW ARE SYMPTOMS THAT SHOULD BE REPORTED IMMEDIATELY: *FEVER GREATER THAN 100.4 F (38 C) OR HIGHER *CHILLS OR SWEATING *NAUSEA AND VOMITING THAT IS NOT CONTROLLED WITH YOUR NAUSEA MEDICATION *UNUSUAL SHORTNESS OF BREATH *UNUSUAL BRUISING OR BLEEDING *URINARY PROBLEMS (pain or burning when urinating, or frequent urination) *BOWEL PROBLEMS (unusual diarrhea, constipation, pain near the anus) TENDERNESS IN MOUTH AND THROAT WITH OR WITHOUT PRESENCE OF ULCERS (sore throat, sores in mouth, or a toothache) UNUSUAL RASH, SWELLING OR PAIN  UNUSUAL VAGINAL DISCHARGE OR ITCHING   Items with * indicate a potential emergency and should be followed up as soon as possible or go to the Emergency Department if any problems should occur.  Please show the CHEMOTHERAPY ALERT CARD or  IMMUNOTHERAPY ALERT CARD at check-in to the Emergency Department and triage nurse.  Should you have questions after your visit or need to cancel or reschedule your appointment, please contact [facility] - A DEPT OF [facility]  Dept: [phone]  and follow the prompts.  Office hours are 8:00 a.m. to 4:30 p.m. Monday - Friday. Please note that voicemails left after 4:00 p.m. may not be returned until the following business day.  We are closed weekends and major holidays. You have access to a nurse at all times for urgent questions. Please call the main number to the clinic Dept: [phone] and follow the prompts.   For any non-urgent questions, you may also contact your provider using MyChart. We now offer e-Visits for anyone 93 and older to request care online for non-urgent symptoms. For details visit [email or web address].   Also download the MyChart app! Go to the app store, search "MyChart", open the app, select [facility], and log in with your MyChart username and password.

## 2023-12-16 ENCOUNTER — Encounter: Payer: Self-pay | Admitting: Internal Medicine

## 2023-12-16 ENCOUNTER — Other Ambulatory Visit (HOSPITAL_COMMUNITY): Payer: Self-pay

## 2023-12-16 ENCOUNTER — Inpatient Hospital Stay: Payer: HMO

## 2023-12-16 VITALS — BP 142/80 | HR 62 | Temp 98.0°F | Resp 18

## 2023-12-16 DIAGNOSIS — Z5111 Encounter for antineoplastic chemotherapy: Secondary | ICD-10-CM | POA: Diagnosis not present

## 2023-12-16 DIAGNOSIS — C3412 Malignant neoplasm of upper lobe, left bronchus or lung: Secondary | ICD-10-CM

## 2023-12-16 MED ORDER — PEGFILGRASTIM-CBQV 6 MG/0.6ML ~~LOC~~ SOSY
6.0000 mg | PREFILLED_SYRINGE | Freq: Once | SUBCUTANEOUS | Status: AC
  Administered 2023-12-16: 6 mg via SUBCUTANEOUS
  Filled 2023-12-16: qty 0.6

## 2023-12-21 ENCOUNTER — Other Ambulatory Visit: Payer: Self-pay | Admitting: Nurse Practitioner

## 2023-12-21 ENCOUNTER — Inpatient Hospital Stay

## 2023-12-21 ENCOUNTER — Other Ambulatory Visit (HOSPITAL_COMMUNITY): Payer: Self-pay

## 2023-12-21 ENCOUNTER — Inpatient Hospital Stay: Payer: HMO

## 2023-12-21 DIAGNOSIS — I10 Essential (primary) hypertension: Secondary | ICD-10-CM

## 2023-12-21 DIAGNOSIS — Z5111 Encounter for antineoplastic chemotherapy: Secondary | ICD-10-CM | POA: Diagnosis not present

## 2023-12-21 DIAGNOSIS — C3412 Malignant neoplasm of upper lobe, left bronchus or lung: Secondary | ICD-10-CM

## 2023-12-21 DIAGNOSIS — E782 Mixed hyperlipidemia: Secondary | ICD-10-CM

## 2023-12-21 LAB — CBC WITH DIFFERENTIAL (CANCER CENTER ONLY)
Abs Immature Granulocytes: 0.03 10*3/uL (ref 0.00–0.07)
Basophils Absolute: 0.1 10*3/uL (ref 0.0–0.1)
Basophils Relative: 2 %
Eosinophils Absolute: 0 10*3/uL (ref 0.0–0.5)
Eosinophils Relative: 1 %
HCT: 38.7 % — ABNORMAL LOW (ref 39.0–52.0)
Hemoglobin: 13.2 g/dL (ref 13.0–17.0)
Immature Granulocytes: 1 %
Lymphocytes Relative: 24 %
Lymphs Abs: 0.9 10*3/uL (ref 0.7–4.0)
MCH: 30.8 pg (ref 26.0–34.0)
MCHC: 34.1 g/dL (ref 30.0–36.0)
MCV: 90.4 fL (ref 80.0–100.0)
Monocytes Absolute: 0.4 10*3/uL (ref 0.1–1.0)
Monocytes Relative: 12 %
Neutro Abs: 2.2 10*3/uL (ref 1.7–7.7)
Neutrophils Relative %: 60 %
Platelet Count: 153 10*3/uL (ref 150–400)
RBC: 4.28 MIL/uL (ref 4.22–5.81)
RDW: 13.2 % (ref 11.5–15.5)
WBC Count: 3.6 10*3/uL — ABNORMAL LOW (ref 4.0–10.5)
nRBC: 0 % (ref 0.0–0.2)

## 2023-12-21 LAB — CMP (CANCER CENTER ONLY)
ALT: 21 U/L (ref 0–44)
AST: 14 U/L — ABNORMAL LOW (ref 15–41)
Albumin: 3.6 g/dL (ref 3.5–5.0)
Alkaline Phosphatase: 93 U/L (ref 38–126)
Anion gap: 6 (ref 5–15)
BUN: 25 mg/dL — ABNORMAL HIGH (ref 8–23)
CO2: 33 mmol/L — ABNORMAL HIGH (ref 22–32)
Calcium: 8.8 mg/dL — ABNORMAL LOW (ref 8.9–10.3)
Chloride: 96 mmol/L — ABNORMAL LOW (ref 98–111)
Creatinine: 1.11 mg/dL (ref 0.61–1.24)
GFR, Estimated: >60 mL/min (ref >60)
Glucose, Bld: 138 mg/dL — ABNORMAL HIGH (ref 70–99)
Potassium: 3.6 mmol/L (ref 3.5–5.1)
Sodium: 135 mmol/L (ref 135–145)
Total Bilirubin: 0.8 mg/dL (ref 0.0–1.2)
Total Protein: 5.9 g/dL — ABNORMAL LOW (ref 6.5–8.1)

## 2023-12-21 LAB — MAGNESIUM: Magnesium: 2.1 mg/dL (ref 1.7–2.4)

## 2023-12-21 MED ORDER — ATORVASTATIN CALCIUM 20 MG PO TABS
20.0000 mg | ORAL_TABLET | Freq: Every day | ORAL | 1 refills | Status: DC
Start: 2023-12-21 — End: 2024-06-20
  Filled 2024-04-03: qty 90, 90d supply, fill #0

## 2023-12-21 MED ORDER — BENAZEPRIL HCL 40 MG PO TABS: 40.0000 mg | ORAL_TABLET | Freq: Every day | ORAL | 1 refills | Status: DC

## 2023-12-21 MED ORDER — HEPARIN SOD (PORK) LOCK FLUSH 100 UNIT/ML IV SOLN
500.0000 [IU] | Freq: Once | INTRAVENOUS | Status: AC | PRN
  Administered 2023-12-21: 500 [IU]

## 2023-12-21 MED ORDER — SODIUM CHLORIDE 0.9% FLUSH
10.0000 mL | INTRAVENOUS | Status: DC | PRN
  Administered 2023-12-21: 10 mL

## 2023-12-21 MED ORDER — HYDROCHLOROTHIAZIDE 12.5 MG PO CAPS: 12.5000 mg | ORAL_CAPSULE | Freq: Every day | ORAL | 1 refills | Status: DC

## 2023-12-21 NOTE — Telephone Encounter (Signed)
 Removed refill request for gabapentin and metoprolol. Patient receives gabapentin and metoprolol from cardiothoracic surgeon (Dr Charlett Lango).

## 2023-12-21 NOTE — Telephone Encounter (Signed)
 Copied from CRM [id number]. Topic: Clinical - Medication Refill >> Dec 21, 2023  8:47 AM Arley Phenix D wrote: Most Recent Primary Care Visit:  Provider: Sue Lush  Department: [facility]  Visit Type: MEDICARE AWV, SEQUENTIAL  Date: 11/21/2023  Medication: atorvastatin (LIPITOR) 20 MG tablet, benazepril (LOTENSIN) 40 MG tablet, hydrochlorothiazide (MICROZIDE) 12.5 MG capsule, gabapentin (NEURONTIN) 300 MG capsule, metoprolol tartrate (LOPRESSOR) 25 MG tablet  Has the patient contacted their pharmacy? No (Agent: If no, request that the patient contact the pharmacy for the refill. If patient does not wish to contact the pharmacy document the reason why and proceed with request.) (Agent: If yes, when and what did the pharmacy advise?)   Is this the correct pharmacy for this prescription? Yes If no, delete pharmacy and type the correct one.  This is the patient's preferred pharmacy:   [facility] [address] [place] [address] Phone: [phone] Fax: [phone]     Has the prescription been filled recently? No  Is the patient out of the medication? No  Has the patient been seen for an appointment in the last year OR does the patient have an upcoming appointment? Yes  Can we respond through MyChart? Yes  Agent: Please be advised that Rx refills may take up to 3 business days. We ask that you follow-up with your pharmacy.

## 2023-12-22 ENCOUNTER — Other Ambulatory Visit: Payer: Self-pay | Admitting: Nurse Practitioner

## 2023-12-22 ENCOUNTER — Other Ambulatory Visit (HOSPITAL_COMMUNITY): Payer: Self-pay

## 2023-12-22 ENCOUNTER — Other Ambulatory Visit: Payer: Self-pay

## 2023-12-22 DIAGNOSIS — J432 Centrilobular emphysema: Secondary | ICD-10-CM

## 2023-12-22 DIAGNOSIS — I10 Essential (primary) hypertension: Secondary | ICD-10-CM

## 2023-12-22 DIAGNOSIS — E782 Mixed hyperlipidemia: Secondary | ICD-10-CM

## 2023-12-22 MED ORDER — FLUTICASONE-SALMETEROL 250-50 MCG/ACT IN AEPB
1.0000 | INHALATION_SPRAY | Freq: Two times a day (BID) | RESPIRATORY_TRACT | 0 refills | Status: DC
  Filled 2023-12-22: qty 180, 90d supply, fill #0

## 2023-12-22 MED ORDER — METOPROLOL TARTRATE 25 MG PO TABS
12.5000 mg | ORAL_TABLET | Freq: Two times a day (BID) | ORAL | 1 refills | Status: DC
  Filled 2023-12-22: qty 30, 30d supply, fill #0

## 2023-12-24 ENCOUNTER — Other Ambulatory Visit (HOSPITAL_COMMUNITY): Payer: Self-pay

## 2023-12-27 ENCOUNTER — Other Ambulatory Visit: Payer: HMO

## 2023-12-27 ENCOUNTER — Ambulatory Visit: Payer: HMO

## 2023-12-27 ENCOUNTER — Ambulatory Visit: Payer: HMO | Admitting: Internal Medicine

## 2023-12-28 ENCOUNTER — Telehealth: Payer: Self-pay

## 2023-12-28 ENCOUNTER — Other Ambulatory Visit: Payer: Self-pay

## 2023-12-28 ENCOUNTER — Inpatient Hospital Stay: Attending: Internal Medicine

## 2023-12-28 ENCOUNTER — Inpatient Hospital Stay: Payer: HMO

## 2023-12-28 ENCOUNTER — Other Ambulatory Visit (HOSPITAL_COMMUNITY): Payer: Self-pay

## 2023-12-28 DIAGNOSIS — Z79899 Other long term (current) drug therapy: Secondary | ICD-10-CM | POA: Insufficient documentation

## 2023-12-28 DIAGNOSIS — F17210 Nicotine dependence, cigarettes, uncomplicated: Secondary | ICD-10-CM | POA: Diagnosis not present

## 2023-12-28 DIAGNOSIS — C3412 Malignant neoplasm of upper lobe, left bronchus or lung: Secondary | ICD-10-CM | POA: Diagnosis not present

## 2023-12-28 DIAGNOSIS — Z5111 Encounter for antineoplastic chemotherapy: Secondary | ICD-10-CM | POA: Diagnosis not present

## 2023-12-28 DIAGNOSIS — Z5189 Encounter for other specified aftercare: Secondary | ICD-10-CM | POA: Diagnosis not present

## 2023-12-28 DIAGNOSIS — Z95828 Presence of other vascular implants and grafts: Secondary | ICD-10-CM | POA: Insufficient documentation

## 2023-12-28 LAB — CBC WITH DIFFERENTIAL (CANCER CENTER ONLY)
Abs Immature Granulocytes: 0.76 10*3/uL — ABNORMAL HIGH (ref 0.00–0.07)
Basophils Absolute: 0.1 10*3/uL (ref 0.0–0.1)
Basophils Relative: 0 %
Eosinophils Absolute: 0 10*3/uL (ref 0.0–0.5)
Eosinophils Relative: 0 %
HCT: 39.2 % (ref 39.0–52.0)
Hemoglobin: 13.4 g/dL (ref 13.0–17.0)
Immature Granulocytes: 3 %
Lymphocytes Relative: 7 %
Lymphs Abs: 1.6 10*3/uL (ref 0.7–4.0)
MCH: 31.9 pg (ref 26.0–34.0)
MCHC: 34.2 g/dL (ref 30.0–36.0)
MCV: 93.3 fL (ref 80.0–100.0)
Monocytes Absolute: 0.7 10*3/uL (ref 0.1–1.0)
Monocytes Relative: 3 %
Neutro Abs: 19.6 10*3/uL — ABNORMAL HIGH (ref 1.7–7.7)
Neutrophils Relative %: 87 %
Platelet Count: 212 10*3/uL (ref 150–400)
RBC: 4.2 MIL/uL — ABNORMAL LOW (ref 4.22–5.81)
RDW: 13.8 % (ref 11.5–15.5)
WBC Count: 22.7 10*3/uL — ABNORMAL HIGH (ref 4.0–10.5)
nRBC: 0 % (ref 0.0–0.2)

## 2023-12-28 LAB — CMP (CANCER CENTER ONLY)
ALT: 20 U/L (ref 0–44)
AST: 20 U/L (ref 15–41)
Albumin: 3.7 g/dL (ref 3.5–5.0)
Alkaline Phosphatase: 123 U/L (ref 38–126)
Anion gap: 6 (ref 5–15)
BUN: 12 mg/dL (ref 8–23)
CO2: 34 mmol/L — ABNORMAL HIGH (ref 22–32)
Calcium: 8.7 mg/dL — ABNORMAL LOW (ref 8.9–10.3)
Chloride: 99 mmol/L (ref 98–111)
Creatinine: 1.06 mg/dL (ref 0.61–1.24)
GFR, Estimated: >60 mL/min (ref >60)
Glucose, Bld: 117 mg/dL — ABNORMAL HIGH (ref 70–99)
Potassium: 3.5 mmol/L (ref 3.5–5.1)
Sodium: 139 mmol/L (ref 135–145)
Total Bilirubin: 0.3 mg/dL (ref 0.0–1.2)
Total Protein: 6.1 g/dL — ABNORMAL LOW (ref 6.5–8.1)

## 2023-12-28 LAB — MAGNESIUM: Magnesium: 2.1 mg/dL (ref 1.7–2.4)

## 2023-12-28 MED ORDER — SODIUM CHLORIDE 0.9% FLUSH
10.0000 mL | Freq: Once | INTRAVENOUS | Status: AC
  Administered 2023-12-28: 10 mL

## 2023-12-28 MED ORDER — HEPARIN SOD (PORK) LOCK FLUSH 100 UNIT/ML IV SOLN
250.0000 [IU] | Freq: Once | INTRAVENOUS | Status: AC
  Administered 2023-12-28: 250 [IU]

## 2023-12-28 NOTE — Addendum Note (Signed)
 Encounter addended by: Edward Qualia on: 12/28/2023 12:02 PM  Actions taken: Imaging Exam ended

## 2023-12-28 NOTE — Telephone Encounter (Signed)
 Patient called and had questions about EMLA cream.  Informed patient to put a blob of cream over the port and do not rub it in.  Then place press and seal or cling wrap over top and the nurse will take the cream off before accessing the port. Patient verbalized understanding.

## 2023-12-29 ENCOUNTER — Ambulatory Visit: Payer: HMO

## 2023-12-31 ENCOUNTER — Other Ambulatory Visit (HOSPITAL_COMMUNITY): Payer: Self-pay

## 2024-01-02 ENCOUNTER — Other Ambulatory Visit (HOSPITAL_COMMUNITY): Payer: Self-pay

## 2024-01-04 ENCOUNTER — Inpatient Hospital Stay (HOSPITAL_BASED_OUTPATIENT_CLINIC_OR_DEPARTMENT_OTHER): Payer: HMO | Admitting: Internal Medicine

## 2024-01-04 ENCOUNTER — Other Ambulatory Visit: Payer: HMO

## 2024-01-04 ENCOUNTER — Inpatient Hospital Stay

## 2024-01-04 ENCOUNTER — Inpatient Hospital Stay: Payer: HMO

## 2024-01-04 VITALS — BP 111/63 | HR 70 | Temp 98.0°F | Resp 17 | Ht 71.0 in | Wt 211.6 lb

## 2024-01-04 DIAGNOSIS — Z95828 Presence of other vascular implants and grafts: Secondary | ICD-10-CM

## 2024-01-04 DIAGNOSIS — C3412 Malignant neoplasm of upper lobe, left bronchus or lung: Secondary | ICD-10-CM

## 2024-01-04 DIAGNOSIS — Z5111 Encounter for antineoplastic chemotherapy: Secondary | ICD-10-CM | POA: Diagnosis not present

## 2024-01-04 LAB — CMP (CANCER CENTER ONLY)
ALT: 17 U/L (ref 0–44)
AST: 15 U/L (ref 15–41)
Albumin: 3.7 g/dL (ref 3.5–5.0)
Alkaline Phosphatase: 89 U/L (ref 38–126)
Anion gap: 6 (ref 5–15)
BUN: 15 mg/dL (ref 8–23)
CO2: 29 mmol/L (ref 22–32)
Calcium: 8.9 mg/dL (ref 8.9–10.3)
Chloride: 98 mmol/L (ref 98–111)
Creatinine: 0.92 mg/dL (ref 0.61–1.24)
GFR, Estimated: >60 mL/min (ref >60)
Glucose, Bld: 209 mg/dL — ABNORMAL HIGH (ref 70–99)
Potassium: 4 mmol/L (ref 3.5–5.1)
Sodium: 133 mmol/L — ABNORMAL LOW (ref 135–145)
Total Bilirubin: 0.4 mg/dL (ref 0.0–1.2)
Total Protein: 5.9 g/dL — ABNORMAL LOW (ref 6.5–8.1)

## 2024-01-04 LAB — CBC WITH DIFFERENTIAL (CANCER CENTER ONLY)
Abs Immature Granulocytes: 0.48 10*3/uL — ABNORMAL HIGH (ref 0.00–0.07)
Basophils Absolute: 0.1 10*3/uL (ref 0.0–0.1)
Basophils Relative: 0 %
Eosinophils Absolute: 0 10*3/uL (ref 0.0–0.5)
Eosinophils Relative: 0 %
HCT: 35.7 % — ABNORMAL LOW (ref 39.0–52.0)
Hemoglobin: 12.5 g/dL — ABNORMAL LOW (ref 13.0–17.0)
Immature Granulocytes: 2 %
Lymphocytes Relative: 4 %
Lymphs Abs: 1 10*3/uL (ref 0.7–4.0)
MCH: 31.7 pg (ref 26.0–34.0)
MCHC: 35 g/dL (ref 30.0–36.0)
MCV: 90.6 fL (ref 80.0–100.0)
Monocytes Absolute: 0.6 10*3/uL (ref 0.1–1.0)
Monocytes Relative: 3 %
Neutro Abs: 22.6 10*3/uL — ABNORMAL HIGH (ref 1.7–7.7)
Neutrophils Relative %: 91 %
Platelet Count: 295 10*3/uL (ref 150–400)
RBC: 3.94 MIL/uL — ABNORMAL LOW (ref 4.22–5.81)
RDW: 14.5 % (ref 11.5–15.5)
WBC Count: 24.8 10*3/uL — ABNORMAL HIGH (ref 4.0–10.5)
nRBC: 0 % (ref 0.0–0.2)

## 2024-01-04 LAB — MAGNESIUM: Magnesium: 1.8 mg/dL (ref 1.7–2.4)

## 2024-01-04 MED ORDER — DEXAMETHASONE SODIUM PHOSPHATE 10 MG/ML IJ SOLN
10.0000 mg | Freq: Once | INTRAMUSCULAR | Status: AC
  Administered 2024-01-04: 10 mg via INTRAVENOUS

## 2024-01-04 MED ORDER — SODIUM CHLORIDE 0.9 % IV SOLN
150.0000 mg | Freq: Once | INTRAVENOUS | Status: AC
  Administered 2024-01-04: 150 mg via INTRAVENOUS
  Filled 2024-01-04: qty 150

## 2024-01-04 MED ORDER — SODIUM CHLORIDE 0.9 % IV SOLN
75.0000 mg/m2 | Freq: Once | INTRAVENOUS | Status: AC
Start: 2024-01-04 — End: 2024-01-04
  Administered 2024-01-04: 160 mg via INTRAVENOUS
  Filled 2024-01-04: qty 16

## 2024-01-04 MED ORDER — PALONOSETRON HCL INJECTION 0.25 MG/5ML
0.2500 mg | Freq: Once | INTRAVENOUS | Status: AC
  Administered 2024-01-04: 0.25 mg via INTRAVENOUS
  Filled 2024-01-04: qty 5

## 2024-01-04 MED ORDER — SODIUM CHLORIDE 0.9% FLUSH
10.0000 mL | Freq: Once | INTRAVENOUS | Status: AC
Start: 2024-01-04 — End: 2024-01-04
  Administered 2024-01-04: 10 mL

## 2024-01-04 MED ORDER — MAGNESIUM SULFATE 2 GM/50ML IV SOLN
2.0000 g | Freq: Once | INTRAVENOUS | Status: AC
  Administered 2024-01-04: 2 g via INTRAVENOUS
  Filled 2024-01-04: qty 50

## 2024-01-04 MED ORDER — POTASSIUM CHLORIDE IN NACL 20-0.9 MEQ/L-% IV SOLN
Freq: Once | INTRAVENOUS | Status: AC
  Filled 2024-01-04: qty 1000

## 2024-01-04 MED ORDER — SODIUM CHLORIDE 0.9 % IV SOLN: INTRAVENOUS | Status: DC

## 2024-01-04 MED ORDER — SODIUM CHLORIDE 0.9 % IV SOLN
75.0000 mg/m2 | Freq: Once | INTRAVENOUS | Status: AC
Start: 2024-01-04 — End: 2024-01-04
  Administered 2024-01-04: 167 mg via INTRAVENOUS
  Filled 2024-01-04: qty 167

## 2024-01-04 NOTE — Progress Notes (Signed)
 [facility] [phone]   [phone]  OFFICE PROGRESS NOTE  [name], NP [address] [place] [address]  DIAGNOSIS: Stage IIA (T1c, N1, M0) Squamous cell Carcinoma diagnosed in January of 2025. Diagnosed with NSCLC, squamous cell carcinoma, stage IIB due to lymph node involvement and tumor size (2.9 cm).  PD-L1 TPS was 0%  PRIOR THERAPY: Status post left upper lobectomy with lymph node dissection on October 03, 2023.   CURRENT THERAPY: Systemic chemotherapy with cisplatin 75 Mg/M2 and docetaxel 75 Mg/M2 with Neulasta support every 3 weeks.  First dose November 16, 2023.  Status post 2 cycles  INTERVAL HISTORY: Cameron [name] 73 y.o. male returns to the clinic today for follow-up visit accompanied by his wife. Discussed the use of AI scribe software for clinical note transcription with the patient, who gave verbal consent to proceed.  History of Present Illness   He is a 73 year old with stage 2A non-small cell lung cancer who presents for follow-up during adjuvant chemotherapy. He is accompanied by his spouse, who assists in managing his treatment regimen.  He is currently undergoing adjuvant systemic chemotherapy with cisplatin and docetaxel following a left upper lobectomy with lymph node dissection. He has completed three cycles of chemotherapy.  He experiences significant fatigue, particularly about a week after treatment, which leaves him feeling without energy and occasionally depressed.  He faces challenges with the process of getting blood work done, noting that it has taken up to two hours on recent visits due to staffing issues.  His spouse assists in adjusting his diet based on blood work results. His sodium levels have been low, and his spouse has been providing meals high in protein and sodium, such as sausage gravy with toast, to address this. He also notes that his white blood count and sugar levels are elevated.  No  new complaints related to chemotherapy aside from fatigue. No significant physical issues beyond the fatigue and occasional feelings of depression.        MEDICAL HISTORY: Past Medical History:  Diagnosis Date   Adenomatous colon polyp 05/2009; 11/2014   2010:Tubular adenoma, no high grade dysplasia.Marland Kitchen  + Hyperplastic polyps. 2016: Tubular adenoma-rpt 5 yrs.   Cancer (HCC)    TURP for prostate cancer   COPD (chronic obstructive pulmonary disease) (HCC)    Coronary artery disease    Difficult intubation    H/O chronic gastritis 05/2009   EGD: no h.pylori,dysplasia,or evidence of malignancy   Hyperlipidemia    lovaza from prior PMD-stopped due to cost   Hypertension    Obesity, Class I, BMI 30.0-34.9 (see actual BMI) 04/23/2014   Tobacco dependence    Ventral hernia    small    ALLERGIES:  has no known allergies.  MEDICATIONS:  Current Outpatient Medications  Medication Sig Dispense Refill   acetaminophen (TYLENOL) 500 MG tablet Take 1-2 tablets (500-1,000 mg total) by mouth every 6 (six) hours as needed. 30 tablet 0   amiodarone (PACERONE) 200 MG tablet Take 1 tablet (200 mg total) by mouth 2 (two) times daily for 10 days, then decrease to 1 tablet (200 mg) daily. 60 tablet 1   Ascorbic Acid (VITAMIN C PO) Take 1 tablet by mouth in the morning.     atorvastatin (LIPITOR) 20 MG tablet Take 1 tablet (20 mg total) by mouth daily. 90 tablet 1   benazepril (LOTENSIN) 40 MG tablet Take 1 tablet (40 mg total)  by mouth daily. 90 tablet 1   dexamethasone (DECADRON) 4 MG tablet Take 2 tabs (8 mg) twice daily starting the day before docetaxel, then daily x 3 days after cisplatin.Take with food. 30 tablet 1   fluticasone (FLONASE) 50 MCG/ACT nasal spray Place 1 spray into both nostrils daily.     fluticasone-salmeterol (ADVAIR DISKUS) 250-50 MCG/ACT AEPB Inhale 1 puff into the lungs in the morning and at bedtime. 180 each 0   gabapentin (NEURONTIN) 300 MG capsule Take 1 capsule (300 mg total)  by mouth 2 (two) times daily. 90 capsule 2   hydrochlorothiazide (MICROZIDE) 12.5 MG capsule Take 1 capsule (12.5 mg total) by mouth daily. 90 capsule 1   hydrocortisone ointment 0.5 % Apply 1 Application topically 2 (two) times daily. 30 g 0   ibuprofen (ADVIL) 800 MG tablet Take 800 mg by mouth every 8 (eight) hours as needed (pain.).     lidocaine-prilocaine (EMLA) cream Apply to the Port-A-Cath site 30-60 minutes before chemotherapy 30 g 0   MAGNESIUM PO Take 1 capsule by mouth in the morning. Magnesium Citrate     metoprolol tartrate (LOPRESSOR) 25 MG tablet Take 1/2 tablet (12.5 mg total) by mouth 2 (two) times daily. 30 tablet 1   ondansetron (ZOFRAN) 8 MG tablet Take 1 tablet (8 mg total) by mouth every 8 (eight) hours as needed for nausea or vomiting. Begin on the third day after cisplatin chemotherapy. 30 tablet 1   oxyCODONE (OXY IR/ROXICODONE) 5 MG immediate release tablet Take 1-2 tablets (5-10 mg total) by mouth every 4 (four) hours as needed for moderate pain (pain score 4-6). 30 tablet 0   prochlorperazine (COMPAZINE) 10 MG tablet Take 1 tablet (10 mg total) by mouth every 6 (six) hours as needed for nausea or vomiting. 30 tablet 1   valACYclovir (VALTREX) 1000 MG tablet Take 1,000 mg by mouth 2 (two) times daily as needed (fever blisters/outbreaks).     No current facility-administered medications for this visit.    SURGICAL HISTORY:  Past Surgical History:  Procedure Laterality Date   APPENDECTOMY  09/28/1971   BIOPSY  08/15/2023   Procedure: BIOPSY;  Surgeon: Josephine Igo, DO;  Location: [facility];  Service: Pulmonary;;   BLADDER DIVERTICULECTOMY N/A 09/30/2022   Procedure: BLADDER DIVERTICULECTOMY;  Surgeon: Heloise Purpura, MD;  Location: [facility];  Service: Urology;  Laterality: N/A;   CARPAL TUNNEL RELEASE Left    CHOLECYSTECTOMY N/A 07/06/2018   Procedure: LAPAROSCOPIC CHOLECYSTECTOMY WITH INTRAOPERATIVE CHOLANGIOGRAM;  Surgeon: Ovidio Kin, MD;  Location: [facility] OR;   Service: General;  Laterality: N/A;   COLONOSCOPY  09/27/2008   [facility]   CYSTOSCOPY N/A 09/30/2022   Procedure: Derinda Late;  Surgeon: Heloise Purpura, MD;  Location: [facility];  Service: Urology;  Laterality: N/A;   EYE SURGERY  2018   Cataracts removed only   FINGER SURGERY Left 12/2021   Left middle finger, laceration   HEMOSTASIS CONTROL  08/15/2023   Procedure: HEMOSTASIS CONTROL;  Surgeon: Josephine Igo, DO;  Location: [facility];  Service: Pulmonary;;   INTERCOSTAL NERVE BLOCK Left 10/03/2023   Procedure: INTERCOSTAL NERVE BLOCK;  Surgeon: Loreli Slot, MD;  Location: [facility] OR;  Service: Thoracic;  Laterality: Left;   IR IMAGING GUIDED PORT INSERTION  12/08/2023   LYMPH NODE BIOPSY Left 10/03/2023   Procedure: LYMPH NODE BIOPSY;  Surgeon: Loreli Slot, MD;  Location: [facility] OR;  Service: Thoracic;  Laterality: Left;   ROBOT ASSISTED LAPAROSCOPIC RADICAL PROSTATECTOMY N/A 09/30/2022  Procedure: XI ROBOTIC ASSISTED LAPAROSCOPIC RADICAL PROSTATECTOMY LEVEL 3;  Surgeon: Heloise Purpura, MD;  Location: [facility];  Service: Urology;  Laterality: N/A;  270 MINUTES NEEDED FOR CASE   STRABISMUS SURGERY  age 34 or 9   TRANSURETHRAL RESECTION OF PROSTATE N/A 07/08/2022   Procedure: TRANSURETHRAL RESECTION OF THE PROSTATE (TURP)/ CYSTOSCOPY;  Surgeon: Heloise Purpura, MD;  Location: [facility];  Service: Urology;  Laterality: N/A;   VIDEO BRONCHOSCOPY N/A 08/15/2023   Procedure: VIDEO BRONCHOSCOPY WITHOUT FLUORO;  Surgeon: Josephine Igo, DO;  Location: [facility];  Service: Pulmonary;  Laterality: N/A;    REVIEW OF SYSTEMS:  Constitutional: positive for fatigue Eyes: negative Ears, nose, mouth, throat, and face: negative Respiratory: negative Cardiovascular: negative Gastrointestinal: negative Genitourinary:negative Integument/breast: positive for alopecia Hematologic/lymphatic: negative Musculoskeletal:negative Neurological: negative Behavioral/Psych: negative Endocrine:  negative Allergic/Immunologic: negative   PHYSICAL EXAMINATION: General appearance: alert, cooperative, fatigued, and no distress Head: Normocephalic, without obvious abnormality, atraumatic Neck: no adenopathy, no JVD, supple, symmetrical, trachea midline, and thyroid not enlarged, symmetric, no tenderness/mass/nodules Lymph nodes: Cervical, supraclavicular, and axillary nodes normal. Resp: clear to auscultation bilaterally Back: symmetric, no curvature. ROM normal. No CVA tenderness. Cardio: regular rate and rhythm, S1, S2 normal, no murmur, click, rub or gallop GI: soft, non-tender; bowel sounds normal; no masses,  no organomegaly Extremities: extremities normal, atraumatic, no cyanosis or edema Neurologic: Alert and oriented X 3, normal strength and tone. Normal symmetric reflexes. Normal coordination and gait  ECOG PERFORMANCE STATUS: 1 - Symptomatic but completely ambulatory  Blood pressure 111/63, pulse 70, temperature 98 F (36.7 C), temperature source Temporal, resp. rate 17, height 5\' 11"  (1.803 m), weight 211 lb 9.6 oz (96 kg), SpO2 100%.  LABORATORY DATA: Lab Results  Component Value Date   WBC 24.8 (H) 01/04/2024   HGB 12.5 (L) 01/04/2024   HCT 35.7 (L) 01/04/2024   MCV 90.6 01/04/2024   PLT 295 01/04/2024      Chemistry      Component Value Date/Time   NA 139 12/28/2023 1203   K 3.5 12/28/2023 1203   CL 99 12/28/2023 1203   CO2 34 (H) 12/28/2023 1203   BUN 12 12/28/2023 1203   CREATININE 1.06 12/28/2023 1203      Component Value Date/Time   CALCIUM 8.7 (L) 12/28/2023 1203   ALKPHOS 123 12/28/2023 1203   AST 20 12/28/2023 1203   ALT 20 12/28/2023 1203   BILITOT 0.3 12/28/2023 1203       RADIOGRAPHIC STUDIES: IR IMAGING GUIDED PORT INSERTION Result Date: 12/08/2023 INDICATION: 73 year old male with history of lung cancer requiring central venous access for chemotherapy administration. EXAM: IMPLANTED PORT A CATH PLACEMENT WITH ULTRASOUND AND FLUOROSCOPIC  GUIDANCE COMPARISON:  None Available. MEDICATIONS: None. ANESTHESIA/SEDATION: Moderate (conscious) sedation was employed during this procedure. A total of Versed 2 mg and Fentanyl 100 mcg was administered intravenously. Moderate Sedation Time: 15 minutes. The patient's level of consciousness and vital signs were monitored continuously by radiology nursing throughout the procedure under my direct supervision. CONTRAST:  None FLUOROSCOPY TIME:  One mGy COMPLICATIONS: None immediate. PROCEDURE: The procedure, risks, benefits, and alternatives were explained to the patient. Questions regarding the procedure were encouraged and answered. The patient understands and consents to the procedure. The right neck and chest were prepped with chlorhexidine in a sterile fashion, and a sterile drape was applied covering the operative field. Maximum barrier sterile technique with sterile gowns and gloves were used for the procedure. A timeout was performed prior to the initiation of  the procedure. Ultrasound was used to examine the jugular vein which was compressible and free of internal echoes. A skin marker was used to demarcate the planned venotomy and port pocket incision sites. Local anesthesia was provided to these sites and the subcutaneous tunnel track with 1% lidocaine with 1:100,000 epinephrine. A small incision was created at the jugular access site and blunt dissection was performed of the subcutaneous tissues. Under ultrasound guidance, the jugular vein was accessed with a 21 ga micropuncture needle and an 0.018" wire was inserted to the superior vena cava. Real-time ultrasound guidance was utilized for vascular access including the acquisition of a permanent ultrasound image documenting patency of the accessed vessel. A 5 Fr micopuncture set was then used, through which a 0.035" Rosen wire was passed under fluoroscopic guidance into the inferior vena cava. An 8 Fr dilator was then placed over the wire. A subcutaneous  port pocket was then created along the upper chest wall utilizing a combination of sharp and blunt dissection. The pocket was irrigated with sterile saline, packed with gauze, and observed for hemorrhage. A single lumen clear view power injectable port was chosen for placement. The 8 Fr catheter was tunneled from the port pocket site to the venotomy incision. The port was placed in the pocket. The external catheter was trimmed to appropriate length. The dilator was exchanged for an 8 Fr peel-away sheath under fluoroscopic guidance. The catheter was then placed through the sheath and the sheath was removed. Final catheter positioning was confirmed and documented with a fluoroscopic spot radiograph. The port was accessed with a Huber needle, aspirated, and flushed with heparinized saline. The deep dermal layer of the port pocket incision was closed with interrupted 3-0 Vicryl suture. The skin was opposed with a running subcuticular 4-0 Monocryl suture. Dermabond was then placed over the port pocket and neck incisions. The patient tolerated the procedure well without immediate post procedural complication. FINDINGS: After catheter placement, the tip lies within the superior cavoatrial junction. The catheter aspirates and flushes normally and is ready for immediate use. IMPRESSION: Successful placement of a power injectable Port-A-Cath via the right internal jugular vein. The catheter is ready for immediate use. Marliss Coots, MD [facility] Radiology Electronically Signed   By: Marliss Coots M.D.   On: 12/08/2023 15:43   DG Chest 2 View Result Date: 12/06/2023 CLINICAL DATA:  Status post VATS on 09/20/2023 EXAM: CHEST - 2 VIEW COMPARISON:  Prior chest x-ray 10/19/2023 FINDINGS: Interval resolution of small left apical pneumothorax. Persistent elevation of the left hemidiaphragm. Cardiac and mediastinal contours are unchanged. Atherosclerotic calcification present in  the transverse aorta. No focal airspace infiltrate or pulmonary edema. No large effusion. IMPRESSION: 1. Interval resolution of small left apical pneumothorax. 2. Chronic elevation of the left hemidiaphragm. 3. No acute cardiopulmonary process. Electronically Signed   By: Malachy Moan M.D.   On: 12/06/2023 14:07    ASSESSMENT AND PLAN: This is a very pleasant 73 years old white male with Stage IIA (T1c, N1, M0) Squamous cell Carcinoma diagnosed in January of 2025. Diagnosed with NSCLC, squamous cell carcinoma, stage IIB due to lymph node involvement and tumor size (2.9 cm). Status post left upper lobectomy with lymph node dissection on October 03, 2023.  He has negative PD-L1 expression. The patient is currently undergoing adjuvant systemic chemotherapy with cisplatin 75 Mg/M2 and docetaxel 75 Mg/M2 with Neulasta support status post 2 cycles.  He tolerated the first 2 cycle of his treatment fairly  well except for the alopecia and mild fatigue.      Stage 2A non-small cell lung cancer, squamous cell carcinoma Cameron [name], a 73 year old male, has stage 2A non-small cell lung cancer, squamous cell carcinoma, status post left upper lobectomy with lymph node dissection. He is undergoing adjuvant systemic chemotherapy with cisplatin and docetaxel, having completed three cycles. He experiences fatigue and depression, common chemotherapy side effects. Blood work shows elevated white blood cell count and blood sugar, expected during treatment. Magnesium level is 1.8. Symptoms are normal and temporary. - Continue adjuvant chemotherapy with cisplatin and docetaxel - Monitor blood work for abnormalities - Reassure regarding common side effects of chemotherapy - Encourage reporting of new or worsening symptoms  Fatigue and depression secondary to chemotherapy He reports fatigue and depression, particularly post-chemotherapy. These are common side effects and should improve post-treatment. Symptoms are normal  and temporary. - Provide supportive care and reassurance - Encourage rest and balanced nutrition - Monitor symptoms and provide psychological support if needed  Follow-up He is scheduled for today's chemotherapy and has one more cycle remaining. Follow-up will occur every six months post-chemotherapy to monitor for recurrence or new issues. - Administer today's chemotherapy treatment - Schedule follow-up appointment in three weeks - Plan for six-month follow-up post-chemotherapy   The patient was advised to call immediately if he has any concerning symptoms in the interval. The patient voices understanding of current disease status and treatment options and is in agreement with the current care plan.  All questions were answered. The patient knows to call the clinic with any problems, questions or concerns. We can certainly see the patient much sooner if necessary.  The total time spent in the appointment was 30 minutes.  Disclaimer: This note was dictated with voice recognition software. Similar sounding words can inadvertently be transcribed and may not be corrected upon review.

## 2024-01-04 NOTE — Progress Notes (Signed)
 Per Dr Arbutus Ped, ok to run fluids concurrently with Cisplatin.

## 2024-01-04 NOTE — Patient Instructions (Signed)
 [facility] - A DEPT OF [facility]  Discharge Instructions: Thank you for choosing [facility] to provide your oncology and hematology care.   If you have a lab appointment with the Cancer Center, please go directly to the Cancer Center and check in at the registration area.   Wear comfortable clothing and clothing appropriate for easy access to any Portacath or PICC line.   We strive to give you quality time with your provider. You may need to reschedule your appointment if you arrive late (15 or more minutes).  Arriving late affects you and other patients whose appointments are after yours.  Also, if you miss three or more appointments without notifying the office, you may be dismissed from the clinic at the provider's discretion.      For prescription refill requests, have your pharmacy contact our office and allow 72 hours for refills to be completed.    Today you received the following chemotherapy and/or immunotherapy agents taxotere and cisplatin      To help prevent nausea and vomiting after your treatment, we encourage you to take your nausea medication as directed.  BELOW ARE SYMPTOMS THAT SHOULD BE REPORTED IMMEDIATELY: *FEVER GREATER THAN 100.4 F (38 C) OR HIGHER *CHILLS OR SWEATING *NAUSEA AND VOMITING THAT IS NOT CONTROLLED WITH YOUR NAUSEA MEDICATION *UNUSUAL SHORTNESS OF BREATH *UNUSUAL BRUISING OR BLEEDING *URINARY PROBLEMS (pain or burning when urinating, or frequent urination) *BOWEL PROBLEMS (unusual diarrhea, constipation, pain near the anus) TENDERNESS IN MOUTH AND THROAT WITH OR WITHOUT PRESENCE OF ULCERS (sore throat, sores in mouth, or a toothache) UNUSUAL RASH, SWELLING OR PAIN  UNUSUAL VAGINAL DISCHARGE OR ITCHING   Items with * indicate a potential emergency and should be followed up as soon as possible or go to the Emergency Department if any problems should occur.  Please show the CHEMOTHERAPY ALERT CARD or  IMMUNOTHERAPY ALERT CARD at check-in to the Emergency Department and triage nurse.  Should you have questions after your visit or need to cancel or reschedule your appointment, please contact [facility] - A DEPT OF [facility]  Dept: [phone]  and follow the prompts.  Office hours are 8:00 a.m. to 4:30 p.m. Monday - Friday. Please note that voicemails left after 4:00 p.m. may not be returned until the following business day.  We are closed weekends and major holidays. You have access to a nurse at all times for urgent questions. Please call the main number to the clinic Dept: [phone] and follow the prompts.   For any non-urgent questions, you may also contact your provider using MyChart. We now offer e-Visits for anyone 78 and older to request care online for non-urgent symptoms. For details visit [email or web address].   Also download the MyChart app! Go to the app store, search "MyChart", open the app, select [place], and log in with your MyChart username and password.

## 2024-01-06 ENCOUNTER — Other Ambulatory Visit (HOSPITAL_COMMUNITY): Payer: Self-pay

## 2024-01-06 ENCOUNTER — Inpatient Hospital Stay: Payer: HMO

## 2024-01-06 VITALS — BP 120/73 | HR 64 | Temp 98.1°F | Resp 18

## 2024-01-06 DIAGNOSIS — Z5111 Encounter for antineoplastic chemotherapy: Secondary | ICD-10-CM | POA: Diagnosis not present

## 2024-01-06 DIAGNOSIS — C3412 Malignant neoplasm of upper lobe, left bronchus or lung: Secondary | ICD-10-CM

## 2024-01-06 MED ORDER — PEGFILGRASTIM-CBQV 6 MG/0.6ML ~~LOC~~ SOSY
6.0000 mg | PREFILLED_SYRINGE | Freq: Once | SUBCUTANEOUS | Status: AC
  Administered 2024-01-06: 6 mg via SUBCUTANEOUS
  Filled 2024-01-06: qty 0.6

## 2024-01-11 ENCOUNTER — Other Ambulatory Visit: Payer: Self-pay | Admitting: Medical Oncology

## 2024-01-11 ENCOUNTER — Other Ambulatory Visit (HOSPITAL_COMMUNITY): Payer: Self-pay

## 2024-01-11 ENCOUNTER — Inpatient Hospital Stay

## 2024-01-11 ENCOUNTER — Inpatient Hospital Stay: Payer: HMO

## 2024-01-11 ENCOUNTER — Telehealth: Payer: Self-pay | Admitting: Medical Oncology

## 2024-01-11 DIAGNOSIS — C3412 Malignant neoplasm of upper lobe, left bronchus or lung: Secondary | ICD-10-CM

## 2024-01-11 DIAGNOSIS — Z5111 Encounter for antineoplastic chemotherapy: Secondary | ICD-10-CM | POA: Diagnosis not present

## 2024-01-11 DIAGNOSIS — Z95828 Presence of other vascular implants and grafts: Secondary | ICD-10-CM

## 2024-01-11 DIAGNOSIS — R066 Hiccough: Secondary | ICD-10-CM

## 2024-01-11 LAB — MAGNESIUM: Magnesium: 1.9 mg/dL (ref 1.7–2.4)

## 2024-01-11 LAB — CMP (CANCER CENTER ONLY)
ALT: 21 U/L (ref 0–44)
AST: 13 U/L — ABNORMAL LOW (ref 15–41)
Albumin: 3.4 g/dL — ABNORMAL LOW (ref 3.5–5.0)
Alkaline Phosphatase: 78 U/L (ref 38–126)
Anion gap: 4 — ABNORMAL LOW (ref 5–15)
BUN: 35 mg/dL — ABNORMAL HIGH (ref 8–23)
CO2: 34 mmol/L — ABNORMAL HIGH (ref 22–32)
Calcium: 8.8 mg/dL — ABNORMAL LOW (ref 8.9–10.3)
Chloride: 97 mmol/L — ABNORMAL LOW (ref 98–111)
Creatinine: 1.1 mg/dL (ref 0.61–1.24)
GFR, Estimated: >60 mL/min (ref >60)
Glucose, Bld: 201 mg/dL — ABNORMAL HIGH (ref 70–99)
Potassium: 3.4 mmol/L — ABNORMAL LOW (ref 3.5–5.1)
Sodium: 135 mmol/L (ref 135–145)
Total Bilirubin: 0.8 mg/dL (ref 0.0–1.2)
Total Protein: 5.8 g/dL — ABNORMAL LOW (ref 6.5–8.1)

## 2024-01-11 LAB — CBC WITH DIFFERENTIAL (CANCER CENTER ONLY)
Abs Immature Granulocytes: 0.17 10*3/uL — ABNORMAL HIGH (ref 0.00–0.07)
Basophils Absolute: 0 10*3/uL (ref 0.0–0.1)
Basophils Relative: 1 %
Eosinophils Absolute: 0 10*3/uL (ref 0.0–0.5)
Eosinophils Relative: 1 %
HCT: 34.5 % — ABNORMAL LOW (ref 39.0–52.0)
Hemoglobin: 11.9 g/dL — ABNORMAL LOW (ref 13.0–17.0)
Immature Granulocytes: 11 %
Lymphocytes Relative: 27 %
Lymphs Abs: 0.4 10*3/uL — ABNORMAL LOW (ref 0.7–4.0)
MCH: 31.6 pg (ref 26.0–34.0)
MCHC: 34.5 g/dL (ref 30.0–36.0)
MCV: 91.5 fL (ref 80.0–100.0)
Monocytes Absolute: 0.3 10*3/uL (ref 0.1–1.0)
Monocytes Relative: 17 %
Neutro Abs: 0.7 10*3/uL — ABNORMAL LOW (ref 1.7–7.7)
Neutrophils Relative %: 43 %
Platelet Count: 109 10*3/uL — ABNORMAL LOW (ref 150–400)
RBC: 3.77 MIL/uL — ABNORMAL LOW (ref 4.22–5.81)
RDW: 14.7 % (ref 11.5–15.5)
WBC Count: 1.6 10*3/uL — ABNORMAL LOW (ref 4.0–10.5)
nRBC: 0 % (ref 0.0–0.2)

## 2024-01-11 MED ORDER — SODIUM CHLORIDE 0.9% FLUSH
10.0000 mL | Freq: Once | INTRAVENOUS | Status: AC
  Administered 2024-01-11: 10 mL

## 2024-01-11 MED ORDER — HEPARIN SOD (PORK) LOCK FLUSH 100 UNIT/ML IV SOLN
500.0000 [IU] | Freq: Once | INTRAVENOUS | Status: AC
  Administered 2024-01-11: 500 [IU]

## 2024-01-11 MED ORDER — CHLORPROMAZINE HCL 25 MG PO TABS
25.0000 mg | ORAL_TABLET | Freq: Three times a day (TID) | ORAL | 0 refills | Status: DC | PRN
  Filled 2024-01-11: qty 30, 10d supply, fill #0

## 2024-01-11 NOTE — Telephone Encounter (Addendum)
-  pt is having hiccoughs and burping at the same time.   Sunday , he vomited x 1 ,about 8 oz liquid. Today  , he drank 30 oz  this am . I told him to drink another 30 -40 oz today.   He ate cheerios ,eggs, toast , ham sandwich, gatorade, diet coke, water   As we talked on the phone he said that he is feeling better this afternoon.  I told him to call back if he has worsening symptoms.  Neutrapenic precautions reviewed with pt and wife.

## 2024-01-12 ENCOUNTER — Other Ambulatory Visit (HOSPITAL_COMMUNITY): Payer: Self-pay

## 2024-01-12 IMAGING — DX DG HAND COMPLETE 3+V*L*
3 series · 3 of 3 positions shown · non-contrast
Comparison: None

CLINICAL DATA: Injuries to third and fourth fingers

EXAM:
LEFT HAND - COMPLETE 3+ VIEW

[hand ap]
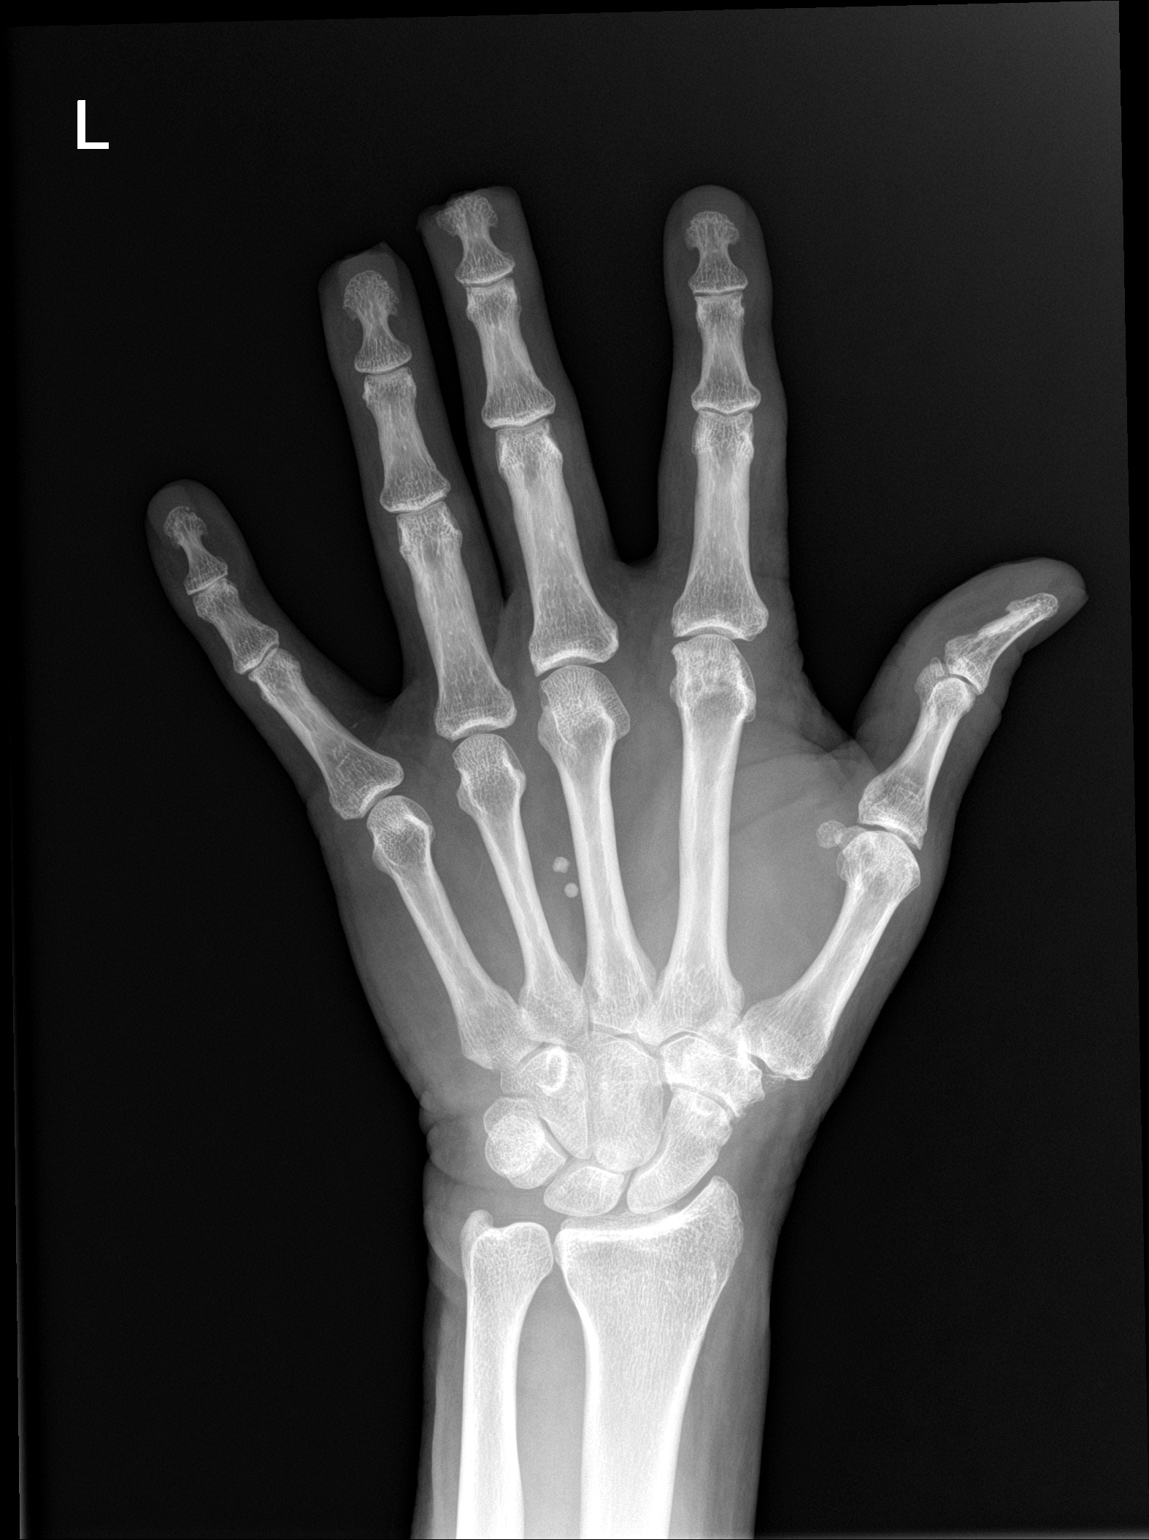

[hand obl]
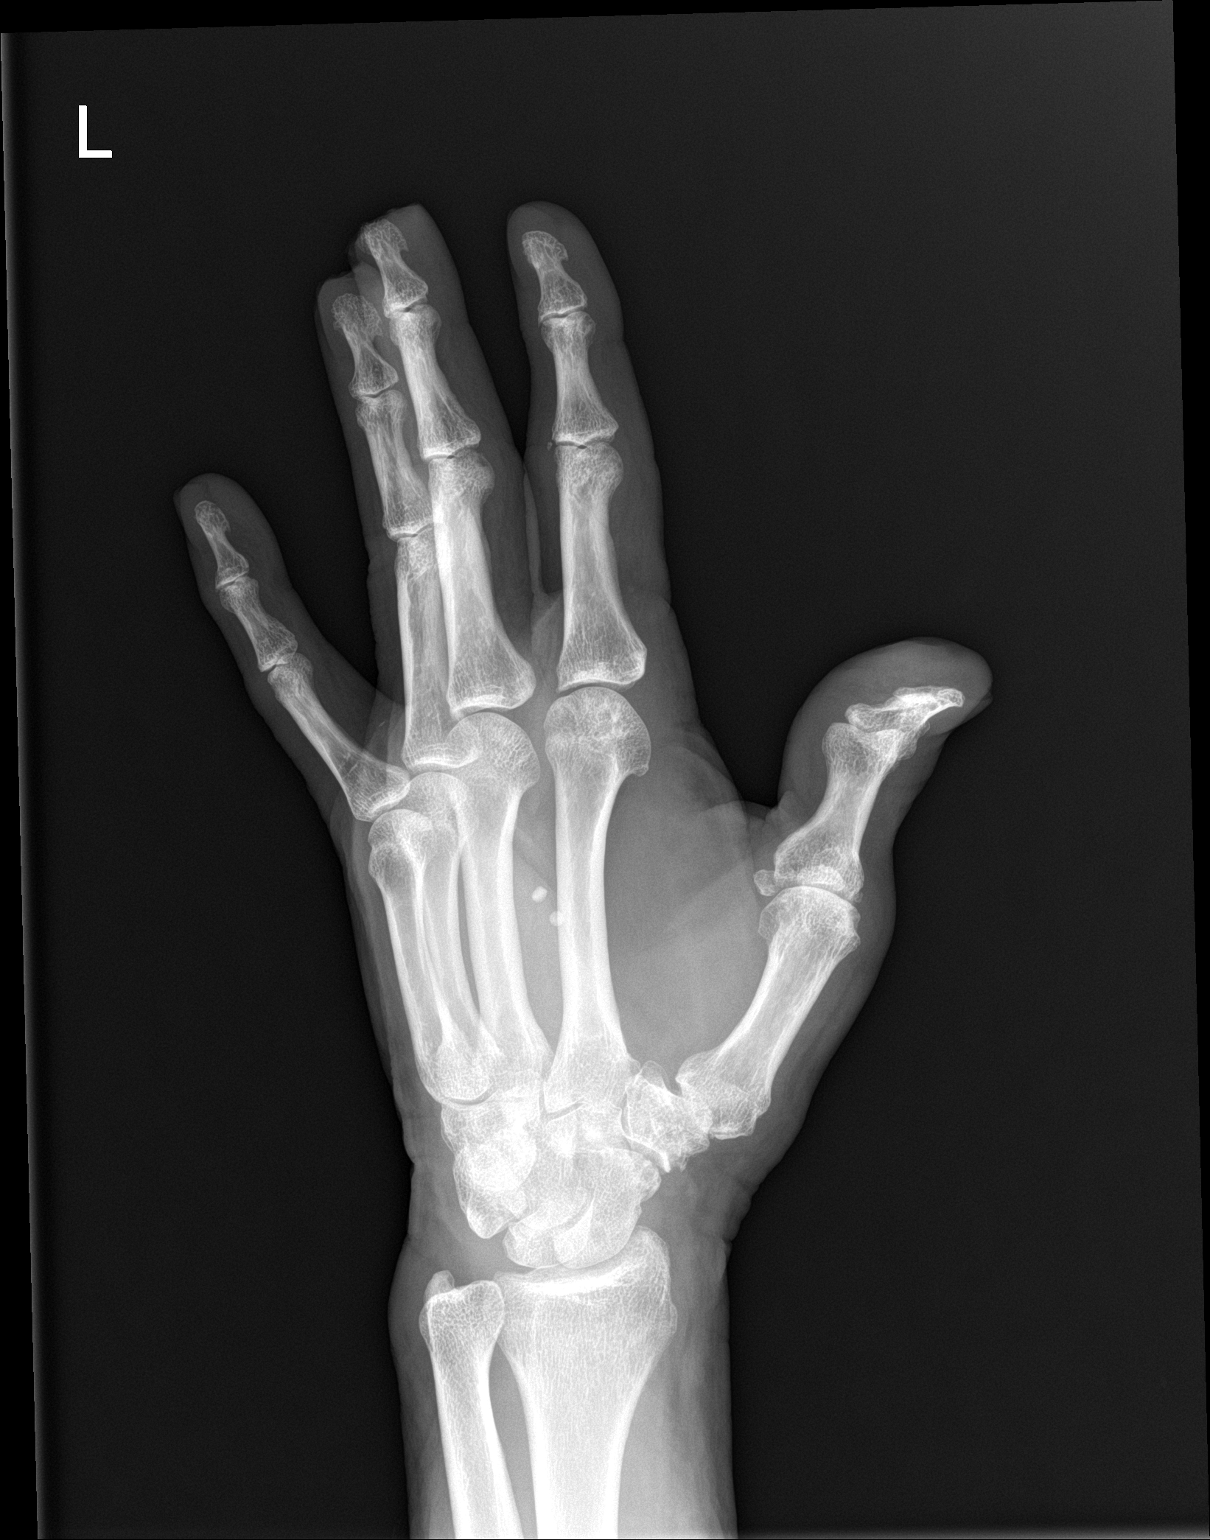

[hand lat]
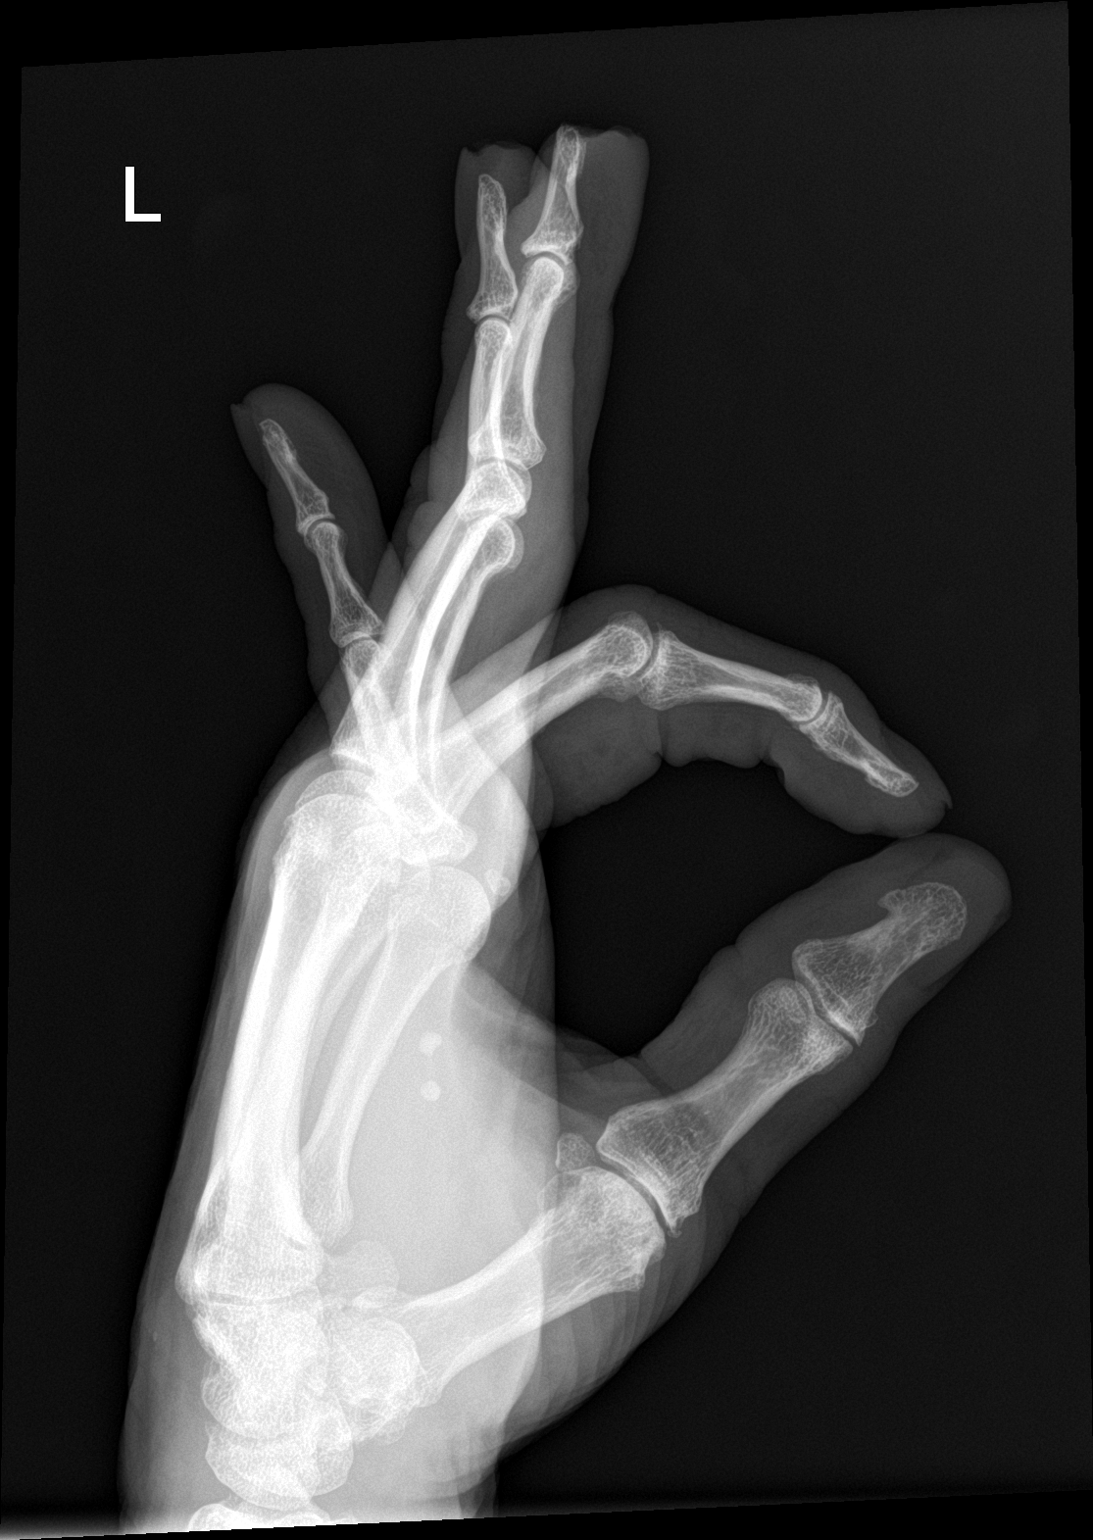

[3 of 3 positions shown; findings below may reference images not displayed]

FINDINGS: Partial amputations of the distal phalanges of the LEFT middle and
ring fingers.

Loss of cortical margination at the tip of the distal phalanx middle
finger consistent with fracture.

No additional fracture, dislocation, or bone destruction.

Joint spaces preserved.
IMPRESSION: Amputations through distal aspects of the LEFT middle and ring
fingers.

Fracture at tip of distal phalanx, LEFT middle finger.

No osseous abnormalities, LEFT ring finger.

## 2024-01-13 ENCOUNTER — Other Ambulatory Visit: Payer: Self-pay

## 2024-01-13 ENCOUNTER — Other Ambulatory Visit (HOSPITAL_COMMUNITY): Payer: Self-pay

## 2024-01-14 ENCOUNTER — Other Ambulatory Visit (HOSPITAL_COMMUNITY): Payer: Self-pay

## 2024-01-18 ENCOUNTER — Other Ambulatory Visit: Payer: HMO

## 2024-01-18 ENCOUNTER — Ambulatory Visit: Payer: HMO | Admitting: Internal Medicine

## 2024-01-18 ENCOUNTER — Ambulatory Visit: Payer: HMO

## 2024-01-18 ENCOUNTER — Inpatient Hospital Stay (HOSPITAL_BASED_OUTPATIENT_CLINIC_OR_DEPARTMENT_OTHER)

## 2024-01-18 ENCOUNTER — Inpatient Hospital Stay: Payer: HMO

## 2024-01-18 DIAGNOSIS — Z5111 Encounter for antineoplastic chemotherapy: Secondary | ICD-10-CM | POA: Diagnosis not present

## 2024-01-18 DIAGNOSIS — Z95828 Presence of other vascular implants and grafts: Secondary | ICD-10-CM

## 2024-01-18 DIAGNOSIS — C3412 Malignant neoplasm of upper lobe, left bronchus or lung: Secondary | ICD-10-CM

## 2024-01-18 LAB — CBC WITH DIFFERENTIAL (CANCER CENTER ONLY)
Abs Immature Granulocytes: 0.67 10*3/uL — ABNORMAL HIGH (ref 0.00–0.07)
Basophils Absolute: 0.1 10*3/uL (ref 0.0–0.1)
Basophils Relative: 1 %
Eosinophils Absolute: 0 10*3/uL (ref 0.0–0.5)
Eosinophils Relative: 0 %
HCT: 33.1 % — ABNORMAL LOW (ref 39.0–52.0)
Hemoglobin: 11.3 g/dL — ABNORMAL LOW (ref 13.0–17.0)
Immature Granulocytes: 4 %
Lymphocytes Relative: 8 %
Lymphs Abs: 1.5 10*3/uL (ref 0.7–4.0)
MCH: 31.5 pg (ref 26.0–34.0)
MCHC: 34.1 g/dL (ref 30.0–36.0)
MCV: 92.2 fL (ref 80.0–100.0)
Monocytes Absolute: 0.6 10*3/uL (ref 0.1–1.0)
Monocytes Relative: 3 %
Neutro Abs: 15.8 10*3/uL — ABNORMAL HIGH (ref 1.7–7.7)
Neutrophils Relative %: 84 %
Platelet Count: 292 10*3/uL (ref 150–400)
RBC: 3.59 MIL/uL — ABNORMAL LOW (ref 4.22–5.81)
RDW: 14.7 % (ref 11.5–15.5)
WBC Count: 18.8 10*3/uL — ABNORMAL HIGH (ref 4.0–10.5)
nRBC: 0 % (ref 0.0–0.2)

## 2024-01-18 LAB — CMP (CANCER CENTER ONLY)
ALT: 14 U/L (ref 0–44)
AST: 17 U/L (ref 15–41)
Albumin: 3.4 g/dL — ABNORMAL LOW (ref 3.5–5.0)
Alkaline Phosphatase: 101 U/L (ref 38–126)
Anion gap: 5 (ref 5–15)
BUN: 16 mg/dL (ref 8–23)
CO2: 35 mmol/L — ABNORMAL HIGH (ref 22–32)
Calcium: 8.2 mg/dL — ABNORMAL LOW (ref 8.9–10.3)
Chloride: 97 mmol/L — ABNORMAL LOW (ref 98–111)
Creatinine: 1.15 mg/dL (ref 0.61–1.24)
GFR, Estimated: >60 mL/min (ref >60)
Glucose, Bld: 130 mg/dL — ABNORMAL HIGH (ref 70–99)
Potassium: 3.1 mmol/L — ABNORMAL LOW (ref 3.5–5.1)
Sodium: 137 mmol/L (ref 135–145)
Total Bilirubin: 0.3 mg/dL (ref 0.0–1.2)
Total Protein: 5.5 g/dL — ABNORMAL LOW (ref 6.5–8.1)

## 2024-01-18 LAB — MAGNESIUM: Magnesium: 1.9 mg/dL (ref 1.7–2.4)

## 2024-01-18 MED ORDER — SODIUM CHLORIDE 0.9% FLUSH
10.0000 mL | Freq: Once | INTRAVENOUS | Status: AC
Start: 2024-01-18 — End: 2024-01-18
  Administered 2024-01-18: 10 mL

## 2024-01-18 MED ORDER — HEPARIN SOD (PORK) LOCK FLUSH 100 UNIT/ML IV SOLN
500.0000 [IU] | Freq: Once | INTRAVENOUS | Status: AC
  Administered 2024-01-18: 500 [IU]

## 2024-01-19 ENCOUNTER — Telehealth: Payer: Self-pay | Admitting: Medical Oncology

## 2024-01-19 NOTE — Telephone Encounter (Signed)
 Pt and wife notified that Dr. Marguerita Shih said to "Please make sure he increase his potassium rich diet because of the low potassium level".  Pt taking OTC Magnesium  250 mg /day And OTC  Calcium  Carbonate 600 mg/day

## 2024-01-20 ENCOUNTER — Ambulatory Visit: Payer: HMO

## 2024-01-24 MED FILL — Fosaprepitant Dimeglumine For IV Infusion 150 MG (Base Eq): INTRAVENOUS | Qty: 5 | Status: AC

## 2024-01-25 ENCOUNTER — Other Ambulatory Visit: Payer: HMO

## 2024-01-25 ENCOUNTER — Inpatient Hospital Stay (HOSPITAL_BASED_OUTPATIENT_CLINIC_OR_DEPARTMENT_OTHER): Payer: HMO | Admitting: Internal Medicine

## 2024-01-25 ENCOUNTER — Inpatient Hospital Stay: Payer: HMO

## 2024-01-25 ENCOUNTER — Inpatient Hospital Stay

## 2024-01-25 VITALS — BP 106/69 | HR 75 | Temp 98.2°F | Resp 17 | Ht 71.0 in | Wt 201.5 lb

## 2024-01-25 DIAGNOSIS — Z95828 Presence of other vascular implants and grafts: Secondary | ICD-10-CM

## 2024-01-25 DIAGNOSIS — C3490 Malignant neoplasm of unspecified part of unspecified bronchus or lung: Secondary | ICD-10-CM | POA: Diagnosis not present

## 2024-01-25 DIAGNOSIS — C3412 Malignant neoplasm of upper lobe, left bronchus or lung: Secondary | ICD-10-CM

## 2024-01-25 DIAGNOSIS — Z5111 Encounter for antineoplastic chemotherapy: Secondary | ICD-10-CM | POA: Diagnosis not present

## 2024-01-25 LAB — CBC WITH DIFFERENTIAL (CANCER CENTER ONLY)
Abs Immature Granulocytes: 0.57 10*3/uL — ABNORMAL HIGH (ref 0.00–0.07)
Basophils Absolute: 0.1 10*3/uL (ref 0.0–0.1)
Basophils Relative: 0 %
Eosinophils Absolute: 0 10*3/uL (ref 0.0–0.5)
Eosinophils Relative: 0 %
HCT: 33.2 % — ABNORMAL LOW (ref 39.0–52.0)
Hemoglobin: 11.3 g/dL — ABNORMAL LOW (ref 13.0–17.0)
Immature Granulocytes: 2 %
Lymphocytes Relative: 6 %
Lymphs Abs: 1.5 10*3/uL (ref 0.7–4.0)
MCH: 31.5 pg (ref 26.0–34.0)
MCHC: 34 g/dL (ref 30.0–36.0)
MCV: 92.5 fL (ref 80.0–100.0)
Monocytes Absolute: 0.8 10*3/uL (ref 0.1–1.0)
Monocytes Relative: 3 %
Neutro Abs: 21 10*3/uL — ABNORMAL HIGH (ref 1.7–7.7)
Neutrophils Relative %: 89 %
Platelet Count: 423 10*3/uL — ABNORMAL HIGH (ref 150–400)
RBC: 3.59 MIL/uL — ABNORMAL LOW (ref 4.22–5.81)
RDW: 15.9 % — ABNORMAL HIGH (ref 11.5–15.5)
WBC Count: 23.9 10*3/uL — ABNORMAL HIGH (ref 4.0–10.5)
nRBC: 0 % (ref 0.0–0.2)

## 2024-01-25 LAB — CMP (CANCER CENTER ONLY)
ALT: 15 U/L (ref 0–44)
AST: 16 U/L (ref 15–41)
Albumin: 3.7 g/dL (ref 3.5–5.0)
Alkaline Phosphatase: 85 U/L (ref 38–126)
Anion gap: 8 (ref 5–15)
BUN: 20 mg/dL (ref 8–23)
CO2: 29 mmol/L (ref 22–32)
Calcium: 8.9 mg/dL (ref 8.9–10.3)
Chloride: 97 mmol/L — ABNORMAL LOW (ref 98–111)
Creatinine: 1.17 mg/dL (ref 0.61–1.24)
GFR, Estimated: >60 mL/min (ref >60)
Glucose, Bld: 174 mg/dL — ABNORMAL HIGH (ref 70–99)
Potassium: 4.4 mmol/L (ref 3.5–5.1)
Sodium: 134 mmol/L — ABNORMAL LOW (ref 135–145)
Total Bilirubin: 0.4 mg/dL (ref 0.0–1.2)
Total Protein: 5.8 g/dL — ABNORMAL LOW (ref 6.5–8.1)

## 2024-01-25 LAB — MAGNESIUM: Magnesium: 1.9 mg/dL (ref 1.7–2.4)

## 2024-01-25 MED ORDER — DEXAMETHASONE SODIUM PHOSPHATE 10 MG/ML IJ SOLN
10.0000 mg | Freq: Once | INTRAMUSCULAR | Status: AC
Start: 2024-01-25 — End: 2024-01-25
  Administered 2024-01-25: 10 mg via INTRAVENOUS
  Filled 2024-01-25: qty 1

## 2024-01-25 MED ORDER — SODIUM CHLORIDE 0.9 % IV SOLN
75.0000 mg/m2 | Freq: Once | INTRAVENOUS | Status: AC
  Administered 2024-01-25: 167 mg via INTRAVENOUS
  Filled 2024-01-25: qty 167

## 2024-01-25 MED ORDER — SODIUM CHLORIDE 0.9% FLUSH
10.0000 mL | Freq: Once | INTRAVENOUS | Status: AC
  Administered 2024-01-25: 10 mL

## 2024-01-25 MED ORDER — POTASSIUM CHLORIDE IN NACL 20-0.9 MEQ/L-% IV SOLN
Freq: Once | INTRAVENOUS | Status: AC
  Filled 2024-01-25: qty 1000

## 2024-01-25 MED ORDER — HEPARIN SOD (PORK) LOCK FLUSH 100 UNIT/ML IV SOLN
500.0000 [IU] | Freq: Once | INTRAVENOUS | Status: AC | PRN
  Administered 2024-01-25: 500 [IU]

## 2024-01-25 MED ORDER — SODIUM CHLORIDE 0.9 % IV SOLN: INTRAVENOUS | Status: DC

## 2024-01-25 MED ORDER — MAGNESIUM SULFATE 2 GM/50ML IV SOLN
2.0000 g | Freq: Once | INTRAVENOUS | Status: AC
  Administered 2024-01-25: 2 g via INTRAVENOUS
  Filled 2024-01-25: qty 50

## 2024-01-25 MED ORDER — PALONOSETRON HCL INJECTION 0.25 MG/5ML
0.2500 mg | Freq: Once | INTRAVENOUS | Status: AC
Start: 2024-01-25 — End: 2024-01-25
  Administered 2024-01-25: 0.25 mg via INTRAVENOUS
  Filled 2024-01-25: qty 5

## 2024-01-25 MED ORDER — SODIUM CHLORIDE 0.9 % IV SOLN
75.0000 mg/m2 | Freq: Once | INTRAVENOUS | Status: AC
  Administered 2024-01-25: 160 mg via INTRAVENOUS
  Filled 2024-01-25: qty 16

## 2024-01-25 MED ORDER — SODIUM CHLORIDE 0.9% FLUSH
10.0000 mL | INTRAVENOUS | Status: DC | PRN
Start: 2024-01-25 — End: 2024-01-25
  Administered 2024-01-25: 10 mL

## 2024-01-25 MED ORDER — SODIUM CHLORIDE 0.9 % IV SOLN
150.0000 mg | Freq: Once | INTRAVENOUS | Status: AC
  Administered 2024-01-25: 150 mg via INTRAVENOUS
  Filled 2024-01-25: qty 150

## 2024-01-25 NOTE — Patient Instructions (Signed)
 [facility] - A DEPT OF [facility]  Discharge Instructions: Thank you for choosing [facility] to provide your oncology and hematology care.   If you have a lab appointment with the Cancer Center, please go directly to the Cancer Center and check in at the registration area.   Wear comfortable clothing and clothing appropriate for easy access to any Portacath or PICC line.   We strive to give you quality time with your provider. You may need to reschedule your appointment if you arrive late (15 or more minutes).  Arriving late affects you and other patients whose appointments are after yours.  Also, if you miss three or more appointments without notifying the office, you may be dismissed from the clinic at the provider's discretion.      For prescription refill requests, have your pharmacy contact our office and allow 72 hours for refills to be completed.    Today you received the following chemotherapy and/or immunotherapy agents Taxotere  and Cisplatin       To help prevent nausea and vomiting after your treatment, we encourage you to take your nausea medication as directed.  BELOW ARE SYMPTOMS THAT SHOULD BE REPORTED IMMEDIATELY: *FEVER GREATER THAN 100.4 F (38 C) OR HIGHER *CHILLS OR SWEATING *NAUSEA AND VOMITING THAT IS NOT CONTROLLED WITH YOUR NAUSEA MEDICATION *UNUSUAL SHORTNESS OF BREATH *UNUSUAL BRUISING OR BLEEDING *URINARY PROBLEMS (pain or burning when urinating, or frequent urination) *BOWEL PROBLEMS (unusual diarrhea, constipation, pain near the anus) TENDERNESS IN MOUTH AND THROAT WITH OR WITHOUT PRESENCE OF ULCERS (sore throat, sores in mouth, or a toothache) UNUSUAL RASH, SWELLING OR PAIN  UNUSUAL VAGINAL DISCHARGE OR ITCHING   Items with * indicate a potential emergency and should be followed up as soon as possible or go to the Emergency Department if any problems should occur.  Please show the CHEMOTHERAPY ALERT CARD or  IMMUNOTHERAPY ALERT CARD at check-in to the Emergency Department and triage nurse.  Should you have questions after your visit or need to cancel or reschedule your appointment, please contact [facility] - A DEPT OF [facility]  Dept: [phone]  and follow the prompts.  Office hours are 8:00 a.m. to 4:30 p.m. Monday - Friday. Please note that voicemails left after 4:00 p.m. may not be returned until the following business day.  We are closed weekends and major holidays. You have access to a nurse at all times for urgent questions. Please call the main number to the clinic Dept: [phone] and follow the prompts.   For any non-urgent questions, you may also contact your provider using MyChart. We now offer e-Visits for anyone 79 and older to request care online for non-urgent symptoms. For details visit [email or web address].   Also download the MyChart app! Go to the app store, search "MyChart", open the app, select [place], and log in with your MyChart username and password.

## 2024-01-25 NOTE — Progress Notes (Signed)
 [facility] [phone]   [phone]  OFFICE PROGRESS NOTE  Dorothe Gaster, NP [address] [place] [address]  DIAGNOSIS: Stage IIA (T1c, N1, M0) Squamous cell Carcinoma diagnosed in January of 2025. Diagnosed with NSCLC, squamous cell carcinoma, stage IIB due to lymph node involvement and tumor size (2.9 cm).  PD-L1 TPS was 0%  PRIOR THERAPY: Status post left upper lobectomy with lymph node dissection on October 03, 2023.   CURRENT THERAPY: Systemic chemotherapy with cisplatin  75 Mg/M2 and docetaxel  75 Mg/M2 with Neulasta  support every 3 weeks.  First dose November 16, 2023.  Status post 3 cycles  INTERVAL HISTORY: Cameron [name] 73 y.o. male returns to the clinic today for follow-up visit accompanied by his wife. Discussed the use of AI scribe software for clinical note transcription with the patient, who gave verbal consent to proceed.  History of Present Illness   Cameron [name] is a 73 year old male with stage 2a non-small cell lung cancer who presents for evaluation before starting the fourth cycle of adjuvant chemotherapy.  He was diagnosed with stage 2a non-small cell lung cancer, squamous cell carcinoma, in January 2025. He underwent a left upper lobectomy with lymph node dissection and is currently receiving adjuvant systemic chemotherapy with cisplatin  and docetaxel . He has completed three cycles and is here for evaluation before starting the fourth and final cycle.  Following the last chemotherapy treatment, he experienced significant side effects, including severe hiccups and balance issues. He was prescribed medication for hiccups, which he has not needed to take recently as the hiccups have subsided. He also reported low potassium levels, which his spouse managed through dietary adjustments. His white blood cell count has recovered to 23.9 thousand, and his hemoglobin is at 11.3, indicating mild anemia. Platelet count is at  423.  He reported feeling cold with a temperature of 95.20F, which was attributed to dehydration. He acknowledges not drinking enough fluids as 'nothing tastes good,' but he is making efforts to hydrate more. His hearing has worsened, which is a known side effect of cisplatin , and he already uses hearing aids.  He is looking forward to completing his treatment and has plans to visit the beach once treatment is finished.        MEDICAL HISTORY: Past Medical History:  Diagnosis Date   Adenomatous colon polyp 05/2009; 11/2014   2010:Tubular adenoma, no high grade dysplasia.[name]  + Hyperplastic polyps. 2016: Tubular adenoma-rpt 5 yrs.   Cancer (HCC)    TURP for prostate cancer   COPD (chronic obstructive pulmonary disease) (HCC)    Coronary artery disease    Difficult intubation    H/O chronic gastritis 05/2009   EGD: no h.pylori,dysplasia,or evidence of malignancy   Hyperlipidemia    lovaza from prior PMD-stopped due to cost   Hypertension    Obesity, Class I, BMI 30.0-34.9 (see actual BMI) 04/23/2014   Tobacco dependence    Ventral hernia    small    ALLERGIES:  has no known allergies.  MEDICATIONS:  Current Outpatient Medications  Medication Sig Dispense Refill   acetaminophen  (TYLENOL ) 500 MG tablet Take 1-2 tablets (500-1,000 mg total) by mouth every 6 (six) hours as needed. 30 tablet 0   amiodarone  (PACERONE ) 200 MG tablet Take 1 tablet (200 mg total) by mouth 2 (two) times daily for 10 days, then decrease to 1 tablet (200 mg) daily. 60 tablet 1   Ascorbic Acid (VITAMIN C  PO) Take 1 tablet by mouth in the morning.     atorvastatin  (LIPITOR) 20 MG tablet Take 1 tablet (20 mg total) by mouth daily. 90 tablet 1   benazepril  (LOTENSIN ) 40 MG tablet Take 1 tablet (40 mg total) by mouth daily. 90 tablet 1   chlorproMAZINE  (THORAZINE ) 25 MG tablet Take 1 tablet (25 mg total) by mouth 3 (three) times daily as needed for hiccoughs. 30 tablet 0   dexamethasone  (DECADRON ) 4 MG tablet Take  2 tabs (8 mg) twice daily starting the day before docetaxel , then daily x 3 days after cisplatin .Take with food. 30 tablet 1   fluticasone  (FLONASE ) 50 MCG/ACT nasal spray Place 1 spray into both nostrils daily.     fluticasone -salmeterol (ADVAIR  DISKUS) 250-50 MCG/ACT AEPB Inhale 1 puff into the lungs in the morning and at bedtime. 180 each 0   FLUZONE HIGH-DOSE 0.5 ML injection      gabapentin  (NEURONTIN ) 300 MG capsule Take 1 capsule (300 mg total) by mouth 2 (two) times daily. 90 capsule 2   hydrochlorothiazide  (MICROZIDE ) 12.5 MG capsule Take 1 capsule (12.5 mg total) by mouth daily. 90 capsule 1   hydrocortisone  ointment 0.5 % Apply 1 Application topically 2 (two) times daily. 30 g 0   ibuprofen (ADVIL) 800 MG tablet Take 800 mg by mouth every 8 (eight) hours as needed (pain.).     lidocaine -prilocaine  (EMLA ) cream Apply to the Port-A-Cath site 30-60 minutes before chemotherapy 30 g 0   MAGNESIUM  PO Take 250 mg by mouth in the morning. Magnesium  Carbonate OTC     metoprolol  tartrate (LOPRESSOR ) 25 MG tablet Take 1/2 tablet (12.5 mg total) by mouth 2 (two) times daily. 30 tablet 1   ondansetron  (ZOFRAN ) 8 MG tablet Take 1 tablet (8 mg total) by mouth every 8 (eight) hours as needed for nausea or vomiting. Begin on the third day after cisplatin  chemotherapy. 30 tablet 1   oxyCODONE  (OXY IR/ROXICODONE ) 5 MG immediate release tablet Take 1-2 tablets (5-10 mg total) by mouth every 4 (four) hours as needed for moderate pain (pain score 4-6). 30 tablet 0   prochlorperazine  (COMPAZINE ) 10 MG tablet Take 1 tablet (10 mg total) by mouth every 6 (six) hours as needed for nausea or vomiting. 30 tablet 1   valACYclovir (VALTREX) 1000 MG tablet Take 1,000 mg by mouth 2 (two) times daily as needed (fever blisters/outbreaks).     No current facility-administered medications for this visit.    SURGICAL HISTORY:  Past Surgical History:  Procedure Laterality Date   APPENDECTOMY  09/28/1971   BIOPSY   08/15/2023   Procedure: BIOPSY;  Surgeon: Prudy Brownie, DO;  Location: [facility];  Service: Pulmonary;;   BLADDER DIVERTICULECTOMY N/A 09/30/2022   Procedure: BLADDER DIVERTICULECTOMY;  Surgeon: Florencio Hunting, MD;  Location: [facility];  Service: Urology;  Laterality: N/A;   CARPAL TUNNEL RELEASE Left    CHOLECYSTECTOMY N/A 07/06/2018   Procedure: LAPAROSCOPIC CHOLECYSTECTOMY WITH INTRAOPERATIVE CHOLANGIOGRAM;  Surgeon: Juanita Norlander, MD;  Location: [facility] OR;  Service: General;  Laterality: N/A;   COLONOSCOPY  09/27/2008   [facility]   CYSTOSCOPY N/A 09/30/2022   Procedure: Ardith Bedford;  Surgeon: Florencio Hunting, MD;  Location: [facility];  Service: Urology;  Laterality: N/A;   EYE SURGERY  2018   Cataracts removed only   FINGER SURGERY Left 12/2021   Left middle finger, laceration   HEMOSTASIS CONTROL  08/15/2023   Procedure: HEMOSTASIS CONTROL;  Surgeon: Prudy Brownie, DO;  Location: [facility]  ENDOSCOPY;  Service: Pulmonary;;   INTERCOSTAL NERVE BLOCK Left 10/03/2023   Procedure: INTERCOSTAL NERVE BLOCK;  Surgeon: Zelphia Higashi, MD;  Location: [facility] OR;  Service: Thoracic;  Laterality: Left;   IR IMAGING GUIDED PORT INSERTION  12/08/2023   LYMPH NODE BIOPSY Left 10/03/2023   Procedure: LYMPH NODE BIOPSY;  Surgeon: Zelphia Higashi, MD;  Location: [facility] OR;  Service: Thoracic;  Laterality: Left;   ROBOT ASSISTED LAPAROSCOPIC RADICAL PROSTATECTOMY N/A 09/30/2022   Procedure: XI ROBOTIC ASSISTED LAPAROSCOPIC RADICAL PROSTATECTOMY LEVEL 3;  Surgeon: Florencio Hunting, MD;  Location: [facility];  Service: Urology;  Laterality: N/A;  270 MINUTES NEEDED FOR CASE   STRABISMUS SURGERY  age 61 or 9   TRANSURETHRAL RESECTION OF PROSTATE N/A 07/08/2022   Procedure: TRANSURETHRAL RESECTION OF THE PROSTATE (TURP)/ CYSTOSCOPY;  Surgeon: Florencio Hunting, MD;  Location: [facility];  Service: Urology;  Laterality: N/A;   VIDEO BRONCHOSCOPY N/A 08/15/2023   Procedure: VIDEO BRONCHOSCOPY WITHOUT FLUORO;  Surgeon: Prudy Brownie, DO;  Location: [facility];  Service: Pulmonary;  Laterality: N/A;    REVIEW OF SYSTEMS:  Constitutional: positive for fatigue Eyes: negative Ears, nose, mouth, throat, and face: negative Respiratory: negative Cardiovascular: negative Gastrointestinal: negative Genitourinary:negative Integument/breast: negative Hematologic/lymphatic: negative Musculoskeletal:negative Neurological: negative Behavioral/Psych: negative Endocrine: negative Allergic/Immunologic: negative   PHYSICAL EXAMINATION: General appearance: alert, cooperative, fatigued, and no distress Head: Normocephalic, without obvious abnormality, atraumatic Neck: no adenopathy, no JVD, supple, symmetrical, trachea midline, and thyroid  not enlarged, symmetric, no tenderness/mass/nodules Lymph nodes: Cervical, supraclavicular, and axillary nodes normal. Resp: clear to auscultation bilaterally Back: symmetric, no curvature. ROM normal. No CVA tenderness. Cardio: regular rate and rhythm, S1, S2 normal, no murmur, click, rub or gallop GI: soft, non-tender; bowel sounds normal; no masses,  no organomegaly Extremities: extremities normal, atraumatic, no cyanosis or edema Neurologic: Alert and oriented X 3, normal strength and tone. Normal symmetric reflexes. Normal coordination and gait  ECOG PERFORMANCE STATUS: 1 - Symptomatic but completely ambulatory  Blood pressure 106/69, pulse 75, temperature 98.2 F (36.8 C), temperature source Temporal, resp. rate 17, height 5\' 11"  (1.803 m), weight 201 lb 8 oz (91.4 kg), SpO2 100%.  LABORATORY DATA: Lab Results  Component Value Date   WBC 18.8 (H) 01/18/2024   HGB 11.3 (L) 01/18/2024   HCT 33.1 (L) 01/18/2024   MCV 92.2 01/18/2024   PLT 292 01/18/2024      Chemistry      Component Value Date/Time   NA 137 01/18/2024 1103   K 3.1 (L) 01/18/2024 1103   CL 97 (L) 01/18/2024 1103   CO2 35 (H) 01/18/2024 1103   BUN 16 01/18/2024 1103   CREATININE 1.15 01/18/2024  1103      Component Value Date/Time   CALCIUM  8.2 (L) 01/18/2024 1103   ALKPHOS 101 01/18/2024 1103   AST 17 01/18/2024 1103   ALT 14 01/18/2024 1103   BILITOT 0.3 01/18/2024 1103       RADIOGRAPHIC STUDIES: No results found.   ASSESSMENT AND PLAN: This is a very pleasant 73 years old white male with Stage IIA (T1c, N1, M0) Squamous cell Carcinoma diagnosed in January of 2025. Diagnosed with NSCLC, squamous cell carcinoma, stage IIB due to lymph node involvement and tumor size (2.9 cm). Status post left upper lobectomy with lymph node dissection on October 03, 2023.  He has negative PD-L1 expression. The patient is currently undergoing adjuvant systemic chemotherapy with cisplatin  75 Mg/M2 and docetaxel  75 Mg/M2 with Neulasta  support status post 3  cycles.  He has been tolerating the treatment fairly well except for fatigue and lack of appetite.    Stage 2A non-small cell lung cancer, squamous cell carcinoma Status post left upper lobectomy with lymph node dissection, currently undergoing adjuvant chemotherapy with cisplatin  and docetaxel . Completed three cycles, presenting for evaluation before the fourth and final cycle. Reports severe hiccups and balance issues, likely due to dehydration and electrolyte imbalances. Blood counts are adequate; WBC 23.9 thousand, hemoglobin 11.3 indicating mild anemia, platelets 423. Emphasized hydration to manage side effects and facilitate chemotherapy clearance. Discussed potential cisplatin -induced hearing loss and need for ENT evaluation if it worsens. - Administer fourth cycle of adjuvant chemotherapy with cisplatin  and docetaxel . - Encourage increased fluid intake to prevent dehydration and manage side effects. - Monitor blood chemistry results before proceeding with treatment. - Schedule follow-up scan in one month to assess treatment response. - Conduct blood work for the next two weeks to monitor recovery post-treatment.  Hearing loss due to  chemotherapy Hearing loss potentially exacerbated by cisplatin , known to affect auditory nerves. Hearing was compromised prior to chemotherapy, with noted worsening. Discussed further ENT evaluation if hearing declines, although he uses hearing aids. - Monitor hearing status and consider ENT referral if hearing loss progresses.  Mild anemia Mild anemia with hemoglobin at 11.3, likely related to chemotherapy. Blood counts are otherwise stable, requiring no immediate intervention. - Monitor hemoglobin levels during chemotherapy treatment.   The patient was advised to call immediately if he has any concerning symptoms in the interval.  The patient voices understanding of current disease status and treatment options and is in agreement with the current care plan.  All questions were answered. The patient knows to call the clinic with any problems, questions or concerns. We can certainly see the patient much sooner if necessary.  The total time spent in the appointment was 30 minutes.  Disclaimer: This note was dictated with voice recognition software. Similar sounding words can inadvertently be transcribed and may not be corrected upon review.

## 2024-01-26 ENCOUNTER — Encounter: Payer: Self-pay | Admitting: Internal Medicine

## 2024-01-26 DIAGNOSIS — I714 Abdominal aortic aneurysm, without rupture, unspecified: Secondary | ICD-10-CM

## 2024-01-26 HISTORY — DX: Abdominal aortic aneurysm, without rupture, unspecified: I71.40

## 2024-01-27 ENCOUNTER — Telehealth: Payer: Self-pay | Admitting: Internal Medicine

## 2024-01-27 ENCOUNTER — Inpatient Hospital Stay: Payer: HMO | Attending: Internal Medicine

## 2024-01-27 ENCOUNTER — Telehealth: Payer: Self-pay | Admitting: Medical Oncology

## 2024-01-27 VITALS — BP 123/73 | HR 76 | Temp 97.9°F | Resp 18

## 2024-01-27 DIAGNOSIS — F17210 Nicotine dependence, cigarettes, uncomplicated: Secondary | ICD-10-CM | POA: Insufficient documentation

## 2024-01-27 DIAGNOSIS — Z79899 Other long term (current) drug therapy: Secondary | ICD-10-CM | POA: Diagnosis not present

## 2024-01-27 DIAGNOSIS — C3412 Malignant neoplasm of upper lobe, left bronchus or lung: Secondary | ICD-10-CM | POA: Insufficient documentation

## 2024-01-27 DIAGNOSIS — E876 Hypokalemia: Secondary | ICD-10-CM | POA: Insufficient documentation

## 2024-01-27 DIAGNOSIS — N179 Acute kidney failure, unspecified: Secondary | ICD-10-CM | POA: Diagnosis not present

## 2024-01-27 DIAGNOSIS — Z5189 Encounter for other specified aftercare: Secondary | ICD-10-CM | POA: Insufficient documentation

## 2024-01-27 DIAGNOSIS — E860 Dehydration: Secondary | ICD-10-CM | POA: Insufficient documentation

## 2024-01-27 MED ORDER — PEGFILGRASTIM-CBQV 6 MG/0.6ML ~~LOC~~ SOSY
6.0000 mg | PREFILLED_SYRINGE | Freq: Once | SUBCUTANEOUS | Status: AC
  Administered 2024-01-27: 6 mg via SUBCUTANEOUS
  Filled 2024-01-27: qty 0.6

## 2024-01-27 NOTE — Addendum Note (Signed)
 Encounter addended by: Paula Born, RT on: 01/27/2024 10:16 AM  Actions taken: Imaging Exam ended, Flowsheet accepted

## 2024-01-27 NOTE — Telephone Encounter (Signed)
 Scheduled appointment per provider request. The patient and his wife are aware of the appointment details. They were not happy about the appointments and were transferred to the nurse. Was able to get them scheduled anyway.

## 2024-01-27 NOTE — Telephone Encounter (Signed)
 Reviewed purpose of upcoming appts. Asking why Aldahir is having a CT chest and not a PET scan. I explained that to her. She voiced understanding.

## 2024-01-28 ENCOUNTER — Emergency Department (HOSPITAL_COMMUNITY)
Admission: EM | Admit: 2024-01-28 | Discharge: 2024-01-28 | Disposition: A | Discharge status: Home / Self Care | Attending: Emergency Medicine | Admitting: Emergency Medicine

## 2024-01-28 ENCOUNTER — Other Ambulatory Visit: Payer: Self-pay

## 2024-01-28 ENCOUNTER — Encounter (HOSPITAL_COMMUNITY): Payer: Self-pay | Admitting: Emergency Medicine

## 2024-01-28 DIAGNOSIS — I959 Hypotension, unspecified: Secondary | ICD-10-CM | POA: Diagnosis not present

## 2024-01-28 DIAGNOSIS — R42 Dizziness and giddiness: Secondary | ICD-10-CM | POA: Diagnosis not present

## 2024-01-28 DIAGNOSIS — R55 Syncope and collapse: Secondary | ICD-10-CM | POA: Insufficient documentation

## 2024-01-28 DIAGNOSIS — D72829 Elevated white blood cell count, unspecified: Secondary | ICD-10-CM | POA: Diagnosis not present

## 2024-01-28 DIAGNOSIS — W19XXXA Unspecified fall, initial encounter: Secondary | ICD-10-CM | POA: Diagnosis not present

## 2024-01-28 DIAGNOSIS — R197 Diarrhea, unspecified: Secondary | ICD-10-CM | POA: Diagnosis not present

## 2024-01-28 LAB — CBC WITH DIFFERENTIAL/PLATELET
Abs Immature Granulocytes: 0.74 10*3/uL — ABNORMAL HIGH (ref 0.00–0.07)
Basophils Absolute: 0.1 10*3/uL (ref 0.0–0.1)
Basophils Relative: 0 %
Eosinophils Absolute: 0 10*3/uL (ref 0.0–0.5)
Eosinophils Relative: 0 %
HCT: 32.5 % — ABNORMAL LOW (ref 39.0–52.0)
Hemoglobin: 10.8 g/dL — ABNORMAL LOW (ref 13.0–17.0)
Immature Granulocytes: 2 %
Lymphocytes Relative: 2 %
Lymphs Abs: 0.6 10*3/uL — ABNORMAL LOW (ref 0.7–4.0)
MCH: 32 pg (ref 26.0–34.0)
MCHC: 33.2 g/dL (ref 30.0–36.0)
MCV: 96.4 fL (ref 80.0–100.0)
Monocytes Absolute: 0.3 10*3/uL (ref 0.1–1.0)
Monocytes Relative: 1 %
Neutro Abs: 32.3 10*3/uL — ABNORMAL HIGH (ref 1.7–7.7)
Neutrophils Relative %: 95 %
Platelets: 205 10*3/uL (ref 150–400)
RBC: 3.37 MIL/uL — ABNORMAL LOW (ref 4.22–5.81)
RDW: 16.2 % — ABNORMAL HIGH (ref 11.5–15.5)
WBC: 34 10*3/uL — ABNORMAL HIGH (ref 4.0–10.5)
nRBC: 0 % (ref 0.0–0.2)

## 2024-01-28 LAB — COMPREHENSIVE METABOLIC PANEL WITH GFR
ALT: 19 U/L (ref 0–44)
AST: 23 U/L (ref 15–41)
Albumin: 2.9 g/dL — ABNORMAL LOW (ref 3.5–5.0)
Alkaline Phosphatase: 63 U/L (ref 38–126)
Anion gap: 9 (ref 5–15)
BUN: 31 mg/dL — ABNORMAL HIGH (ref 8–23)
CO2: 25 mmol/L (ref 22–32)
Calcium: 8.1 mg/dL — ABNORMAL LOW (ref 8.9–10.3)
Chloride: 98 mmol/L (ref 98–111)
Creatinine, Ser: 1.08 mg/dL (ref 0.61–1.24)
GFR, Estimated: >60 mL/min (ref >60)
Glucose, Bld: 132 mg/dL — ABNORMAL HIGH (ref 70–99)
Potassium: 3.5 mmol/L (ref 3.5–5.1)
Sodium: 132 mmol/L — ABNORMAL LOW (ref 135–145)
Total Bilirubin: 0.7 mg/dL (ref 0.0–1.2)
Total Protein: 5.2 g/dL — ABNORMAL LOW (ref 6.5–8.1)

## 2024-01-28 LAB — CBG MONITORING, ED: Glucose-Capillary: 121 mg/dL — ABNORMAL HIGH (ref 70–99)

## 2024-01-28 MED ORDER — HEPARIN SOD (PORK) LOCK FLUSH 100 UNIT/ML IV SOLN
500.0000 [IU] | Freq: Once | INTRAVENOUS | Status: AC
  Administered 2024-01-28: 500 [IU]
  Filled 2024-01-28: qty 5

## 2024-01-28 MED ORDER — SODIUM CHLORIDE 0.9 % IV BOLUS
1000.0000 mL | Freq: Once | INTRAVENOUS | Status: AC
  Administered 2024-01-28: 1000 mL via INTRAVENOUS

## 2024-01-28 MED ORDER — SODIUM CHLORIDE 0.9 % IV SOLN: INTRAVENOUS | Status: DC

## 2024-01-28 NOTE — ED Provider Notes (Signed)
 [place] [facility]MENT AT [facility] Provider Note   CSN: [id number] Arrival date & time: 01/28/24  2440     History  Chief Complaint  Patient presents with   Loss of Consciousness    Cameron [name] is a 73 y.o. male.  73 year old male presents after having syncopal event while in the shower.  Patient states that he just finished his last chemotherapy treatment for stage II lung cancer.  Notes he is having some watery diarrhea.  Denies any fever or emesis.  States he was bending over to get the soap in the shower and passed out.  Denies any traumatic injury from this event.  Does not take any blood thinners.  No other recent medications.  EMS called and patient transported here       Home Medications Prior to Admission medications   Medication Sig Start Date End Date Taking? Authorizing Provider  acetaminophen  (TYLENOL ) 500 MG tablet Take 1-2 tablets (500-1,000 mg total) by mouth every 6 (six) hours as needed. 10/06/23   Barrett, Malachy Scripture, PA-C  amiodarone  (PACERONE ) 200 MG tablet Take 1 tablet (200 mg total) by mouth 2 (two) times daily for 10 days, then decrease to 1 tablet (200 mg) daily. 10/06/23 11/25/23  Barrett, Malachy Scripture, PA-C  Ascorbic Acid (VITAMIN C PO) Take 1 tablet by mouth in the morning.    [provider]  atorvastatin  (LIPITOR) 20 MG tablet Take 1 tablet (20 mg total) by mouth daily. 12/21/23   Dorothe Gaster, NP  benazepril  (LOTENSIN ) 40 MG tablet Take 1 tablet (40 mg total) by mouth daily. 12/21/23   Dorothe Gaster, NP  chlorproMAZINE  (THORAZINE ) 25 MG tablet Take 1 tablet (25 mg total) by mouth 3 (three) times daily as needed for hiccoughs. 01/11/24   Marlene Simas, MD  dexamethasone  (DECADRON ) 4 MG tablet Take 2 tabs (8 mg) twice daily starting the day before docetaxel , then daily x 3 days after cisplatin .Take with food. 11/10/23   Marlene Simas, MD  fluticasone  (FLONASE ) 50 MCG/ACT nasal spray Place 1 spray into both nostrils daily.     [provider]  fluticasone -salmeterol (ADVAIR  DISKUS) 250-50 MCG/ACT AEPB Inhale 1 puff into the lungs in the morning and at bedtime. 12/22/23   Dorothe Gaster, NP  FLUZONE HIGH-DOSE 0.5 ML injection  09/19/23   [provider]  gabapentin  (NEURONTIN ) 300 MG capsule Take 1 capsule (300 mg total) by mouth 2 (two) times daily. 10/06/23 10/05/24  Barrett, Erin R, PA-C  hydrochlorothiazide  (MICROZIDE ) 12.5 MG capsule Take 1 capsule (12.5 mg total) by mouth daily. 12/21/23   Dorothe Gaster, NP  hydrocortisone  ointment 0.5 % Apply 1 Application topically 2 (two) times daily. 12/01/23   Walisiewicz, Kaitlyn E, PA-C  ibuprofen (ADVIL) 800 MG tablet Take 800 mg by mouth every 8 (eight) hours as needed (pain.).    [provider]  lidocaine -prilocaine  (EMLA ) cream Apply to the Port-A-Cath site 30-60 minutes before chemotherapy 12/14/23   Marlene Simas, MD  MAGNESIUM  PO Take 250 mg by mouth in the morning. Magnesium  Carbonate OTC    [provider]  metoprolol  tartrate (LOPRESSOR ) 25 MG tablet Take 1/2 tablet (12.5 mg total) by mouth 2 (two) times daily. 12/22/23   Dorothe Gaster, NP  ondansetron  (ZOFRAN ) 8 MG tablet Take 1 tablet (8 mg total) by mouth every 8 (eight) hours as needed for nausea or vomiting. Begin on the third day after cisplatin  chemotherapy. 11/10/23   Marlene Simas, MD  oxyCODONE  (OXY IR/ROXICODONE ) 5 MG immediate release tablet Take 1-2 tablets (5-10 mg total) by mouth every 4 (four) hours as needed for moderate pain (pain score 4-6). 10/06/23   Barrett, Erin R, PA-C  prochlorperazine  (COMPAZINE ) 10 MG tablet Take 1 tablet (10 mg total) by mouth every 6 (six) hours as needed for nausea or vomiting. 11/10/23   Marlene Simas, MD  valACYclovir (VALTREX) 1000 MG tablet Take 1,000 mg by mouth 2 (two) times daily as needed (fever blisters/outbreaks). 04/05/23   [provider]      Allergies    Patient has no known allergies.    Review of Systems    Review of Systems  All other systems reviewed and are negative.   Physical Exam Updated Vital Signs BP 114/67 (BP Location: Right Arm)   Pulse 74   Temp 97.6 F (36.4 C) (Oral)   Resp 18   Ht 1.803 m (5\' 11" )   Wt 91.2 kg   SpO2 99%   BMI 28.03 kg/m  Physical Exam Vitals and nursing note reviewed.  Constitutional:      General: He is not in acute distress.    Appearance: Normal appearance. He is well-developed. He is not toxic-appearing.  HENT:     Head: Normocephalic and atraumatic.  Eyes:     General: Lids are normal.     Conjunctiva/sclera: Conjunctivae normal.     Pupils: Pupils are equal, round, and reactive to light.  Neck:     Thyroid : No thyroid  mass.     Trachea: No tracheal deviation.  Cardiovascular:     Rate and Rhythm: Normal rate and regular rhythm.     Heart sounds: Normal heart sounds. No murmur heard.    No gallop.  Pulmonary:     Effort: Pulmonary effort is normal. No respiratory distress.     Breath sounds: Normal breath sounds. No stridor. No decreased breath sounds, wheezing, rhonchi or rales.  Abdominal:     General: There is no distension.     Palpations: Abdomen is soft.     Tenderness: There is no abdominal tenderness. There is no rebound.  Musculoskeletal:        General: No tenderness. Normal range of motion.     Cervical back: Normal range of motion and neck supple.  Skin:    General: Skin is warm and dry.     Findings: No abrasion or rash.  Neurological:     General: No focal deficit present.     Mental Status: He is alert and oriented to person, place, and time. Mental status is at baseline.     GCS: GCS eye subscore is 4. GCS verbal subscore is 5. GCS motor subscore is 6.     Cranial Nerves: No cranial nerve deficit.     Sensory: No sensory deficit.     Motor: Motor function is intact.  Psychiatric:        Attention and Perception: Attention normal.        Speech: Speech normal.        Behavior: Behavior normal.     ED  Results / Procedures / Treatments   Labs (all labs ordered are listed, but only abnormal results are displayed) Labs Reviewed  CBG MONITORING, ED - Abnormal; Notable for the following components:      Result Value   Glucose-Capillary 121 (*)    All other components within normal limits  CBC WITH DIFFERENTIAL/PLATELET  COMPREHENSIVE METABOLIC PANEL WITH GFR    EKG EKG  Interpretation Date/Time:  Saturday Jan 28 2024 09:19:39 EDT Ventricular Rate:  76 PR Interval:  161 QRS Duration:  88 QT Interval:  357 QTC Calculation: 402 R Axis:   54  Text Interpretation: Sinus rhythm Borderline T wave abnormalities Confirmed by Lind Repine ([id number]) on 01/28/2024 12:11:43 PM  Radiology No results found.  Procedures Procedures    Medications Ordered in ED Medications  0.9 %  sodium chloride  infusion (has no administration in time range)  sodium chloride  0.9 % bolus 1,000 mL (1,000 mLs Intravenous New Bag/Given 01/28/24 6606)    ED Course/ Medical Decision Making/ A&P                                 Medical Decision Making Amount and/or Complexity of Data Reviewed Labs: ordered. ECG/medicine tests: ordered.  Risk Prescription drug management.   Patient is EKG shows normal sinus rhythm.  Given IV fluids here   White blood cell count 34,000 noted but patient had a shot of Neupogen on Friday and expect that is was causing this.  Patient able to ambulate in department after receiving a meal and feels better at this time.  Suspect some element of dehydration causing his symptoms versus effect from chemotherapy.  Workup discussed with wife who is at bedside.  Feels comfortable taking him home.  Will discharge to home        Final Clinical Impression(s) / ED Diagnoses Final diagnoses:  None    Rx / DC Orders ED Discharge Orders     None         Lind Repine, MD 01/28/24 1213

## 2024-01-28 NOTE — ED Notes (Addendum)
 Pt tolerated sandwich, orange juice well. Pt able to walk without dizziness/syncope.

## 2024-01-28 NOTE — ED Triage Notes (Signed)
 Pt bib EMS from home. Pt passed out in shower. Pt woke up this am with diarrhea and weakness. Pt last chemo treatment Wednesday. Denies N/V.

## 2024-01-30 ENCOUNTER — Other Ambulatory Visit (HOSPITAL_COMMUNITY): Payer: Self-pay

## 2024-01-30 ENCOUNTER — Telehealth: Payer: Self-pay | Admitting: Medical Oncology

## 2024-01-30 NOTE — Telephone Encounter (Signed)
 Saturday: Patient experienced three syncopal episodes while in the bathroom at home. Per wife, patient was unconscious and having continuous diarrhea during the events. EMS was called and transported patient to the Emergency Department, where he remained for approximately 6 hours and received IV fluids. Patient returned home and slept well Saturday night after taking melatonin.  Patient's wife strongly believes that Compazine  may have triggered the syncopal and diarrheal episodes. She has also been administering magnesium  supplements; I advised her to hold these for now, as they may contribute to diarrhea.  Sunday: Patient reports feeling better. Tolerated small amounts of food and fluids:  cup oatmeal, a few bites of chicken broth with rice, and  hamburger. Intake of fluids included Gatorade, water , Boost, and Coca-Cola.  Patient had a few episodes of urinary incontinence during the night; no issues reported during the day.

## 2024-01-31 ENCOUNTER — Telehealth: Payer: Self-pay | Admitting: Medical Oncology

## 2024-01-31 NOTE — Telephone Encounter (Signed)
 Weak this am . 'He is not dehydrated". He took a shower sitting down". He has a sore throat and using salt water  rinses. He ate egg and sausage this am .  I told her to continue what Orhan is doing.

## 2024-02-01 ENCOUNTER — Inpatient Hospital Stay

## 2024-02-01 DIAGNOSIS — C3412 Malignant neoplasm of upper lobe, left bronchus or lung: Secondary | ICD-10-CM

## 2024-02-01 DIAGNOSIS — R55 Syncope and collapse: Secondary | ICD-10-CM | POA: Diagnosis not present

## 2024-02-01 DIAGNOSIS — E860 Dehydration: Secondary | ICD-10-CM | POA: Diagnosis not present

## 2024-02-01 DIAGNOSIS — Z95828 Presence of other vascular implants and grafts: Secondary | ICD-10-CM

## 2024-02-01 DIAGNOSIS — Z452 Encounter for adjustment and management of vascular access device: Secondary | ICD-10-CM | POA: Diagnosis not present

## 2024-02-01 DIAGNOSIS — N179 Acute kidney failure, unspecified: Secondary | ICD-10-CM | POA: Diagnosis not present

## 2024-02-01 LAB — CBC WITH DIFFERENTIAL (CANCER CENTER ONLY)
Abs Immature Granulocytes: 0.04 10*3/uL (ref 0.00–0.07)
Basophils Absolute: 0 10*3/uL (ref 0.0–0.1)
Basophils Relative: 1 %
Eosinophils Absolute: 0 10*3/uL (ref 0.0–0.5)
Eosinophils Relative: 0 %
HCT: 32.7 % — ABNORMAL LOW (ref 39.0–52.0)
Hemoglobin: 11.5 g/dL — ABNORMAL LOW (ref 13.0–17.0)
Immature Granulocytes: 2 %
Lymphocytes Relative: 38 %
Lymphs Abs: 1 10*3/uL (ref 0.7–4.0)
MCH: 32.6 pg (ref 26.0–34.0)
MCHC: 35.2 g/dL (ref 30.0–36.0)
MCV: 92.6 fL (ref 80.0–100.0)
Monocytes Absolute: 0.4 10*3/uL (ref 0.1–1.0)
Monocytes Relative: 15 %
Neutro Abs: 1.1 10*3/uL — ABNORMAL LOW (ref 1.7–7.7)
Neutrophils Relative %: 44 %
Platelet Count: 155 10*3/uL (ref 150–400)
RBC: 3.53 MIL/uL — ABNORMAL LOW (ref 4.22–5.81)
RDW: 15.9 % — ABNORMAL HIGH (ref 11.5–15.5)
WBC Count: 2.6 10*3/uL — ABNORMAL LOW (ref 4.0–10.5)
nRBC: 0 % (ref 0.0–0.2)

## 2024-02-01 LAB — CMP (CANCER CENTER ONLY)
ALT: 12 U/L (ref 0–44)
AST: 11 U/L — ABNORMAL LOW (ref 15–41)
Albumin: 3.4 g/dL — ABNORMAL LOW (ref 3.5–5.0)
Alkaline Phosphatase: 70 U/L (ref 38–126)
Anion gap: 9 (ref 5–15)
BUN: 46 mg/dL — ABNORMAL HIGH (ref 8–23)
CO2: 31 mmol/L (ref 22–32)
Calcium: 8.5 mg/dL — ABNORMAL LOW (ref 8.9–10.3)
Chloride: 98 mmol/L (ref 98–111)
Creatinine: 1.32 mg/dL — ABNORMAL HIGH (ref 0.61–1.24)
GFR, Estimated: 58 mL/min — ABNORMAL LOW (ref >60)
Glucose, Bld: 133 mg/dL — ABNORMAL HIGH (ref 70–99)
Potassium: 3.5 mmol/L (ref 3.5–5.1)
Sodium: 138 mmol/L (ref 135–145)
Total Bilirubin: 0.8 mg/dL (ref 0.0–1.2)
Total Protein: 5.8 g/dL — ABNORMAL LOW (ref 6.5–8.1)

## 2024-02-01 LAB — MAGNESIUM: Magnesium: 1.9 mg/dL (ref 1.7–2.4)

## 2024-02-01 MED ORDER — HEPARIN SOD (PORK) LOCK FLUSH 100 UNIT/ML IV SOLN
500.0000 [IU] | Freq: Once | INTRAVENOUS | Status: AC
Start: 2024-02-01 — End: 2024-02-01
  Administered 2024-02-01: 500 [IU]

## 2024-02-01 MED ORDER — SODIUM CHLORIDE 0.9% FLUSH
10.0000 mL | Freq: Once | INTRAVENOUS | Status: AC
  Administered 2024-02-01: 10 mL

## 2024-02-02 ENCOUNTER — Other Ambulatory Visit (HOSPITAL_COMMUNITY): Payer: Self-pay

## 2024-02-02 ENCOUNTER — Inpatient Hospital Stay (HOSPITAL_COMMUNITY)
Admission: EM | Admit: 2024-02-02 | Discharge: 2024-02-05 | DRG: 683 | Disposition: A | Discharge status: Home / Self Care | Attending: Family Medicine | Admitting: Family Medicine

## 2024-02-02 ENCOUNTER — Encounter (HOSPITAL_COMMUNITY): Payer: Self-pay

## 2024-02-02 ENCOUNTER — Other Ambulatory Visit: Payer: Self-pay

## 2024-02-02 ENCOUNTER — Emergency Department (HOSPITAL_COMMUNITY)

## 2024-02-02 ENCOUNTER — Telehealth: Payer: Self-pay | Admitting: Medical Oncology

## 2024-02-02 DIAGNOSIS — R55 Syncope and collapse: Secondary | ICD-10-CM | POA: Diagnosis not present

## 2024-02-02 DIAGNOSIS — Z8546 Personal history of malignant neoplasm of prostate: Secondary | ICD-10-CM

## 2024-02-02 DIAGNOSIS — Z83438 Family history of other disorder of lipoprotein metabolism and other lipidemia: Secondary | ICD-10-CM

## 2024-02-02 DIAGNOSIS — Z860101 Personal history of adenomatous and serrated colon polyps: Secondary | ICD-10-CM

## 2024-02-02 DIAGNOSIS — Z9049 Acquired absence of other specified parts of digestive tract: Secondary | ICD-10-CM

## 2024-02-02 DIAGNOSIS — E876 Hypokalemia: Secondary | ICD-10-CM | POA: Diagnosis present

## 2024-02-02 DIAGNOSIS — H5310 Unspecified subjective visual disturbances: Secondary | ICD-10-CM | POA: Diagnosis not present

## 2024-02-02 DIAGNOSIS — E860 Dehydration: Secondary | ICD-10-CM | POA: Diagnosis present

## 2024-02-02 DIAGNOSIS — N179 Acute kidney failure, unspecified: Secondary | ICD-10-CM | POA: Diagnosis not present

## 2024-02-02 DIAGNOSIS — Z95828 Presence of other vascular implants and grafts: Secondary | ICD-10-CM

## 2024-02-02 DIAGNOSIS — H35721 Serous detachment of retinal pigment epithelium, right eye: Secondary | ICD-10-CM | POA: Diagnosis not present

## 2024-02-02 DIAGNOSIS — I959 Hypotension, unspecified: Secondary | ICD-10-CM | POA: Diagnosis present

## 2024-02-02 DIAGNOSIS — Z452 Encounter for adjustment and management of vascular access device: Secondary | ICD-10-CM | POA: Diagnosis not present

## 2024-02-02 DIAGNOSIS — Z79899 Other long term (current) drug therapy: Secondary | ICD-10-CM

## 2024-02-02 DIAGNOSIS — Z7951 Long term (current) use of inhaled steroids: Secondary | ICD-10-CM

## 2024-02-02 DIAGNOSIS — H5315 Visual distortions of shape and size: Secondary | ICD-10-CM | POA: Diagnosis not present

## 2024-02-02 DIAGNOSIS — Z8249 Family history of ischemic heart disease and other diseases of the circulatory system: Secondary | ICD-10-CM

## 2024-02-02 DIAGNOSIS — C3412 Malignant neoplasm of upper lobe, left bronchus or lung: Secondary | ICD-10-CM | POA: Diagnosis present

## 2024-02-02 DIAGNOSIS — I2510 Atherosclerotic heart disease of native coronary artery without angina pectoris: Secondary | ICD-10-CM | POA: Diagnosis present

## 2024-02-02 DIAGNOSIS — E66811 Obesity, class 1: Secondary | ICD-10-CM | POA: Diagnosis present

## 2024-02-02 DIAGNOSIS — F17210 Nicotine dependence, cigarettes, uncomplicated: Secondary | ICD-10-CM | POA: Diagnosis present

## 2024-02-02 DIAGNOSIS — K219 Gastro-esophageal reflux disease without esophagitis: Secondary | ICD-10-CM | POA: Diagnosis present

## 2024-02-02 DIAGNOSIS — Z825 Family history of asthma and other chronic lower respiratory diseases: Secondary | ICD-10-CM

## 2024-02-02 DIAGNOSIS — I10 Essential (primary) hypertension: Secondary | ICD-10-CM | POA: Diagnosis present

## 2024-02-02 DIAGNOSIS — Z9079 Acquired absence of other genital organ(s): Secondary | ICD-10-CM

## 2024-02-02 DIAGNOSIS — E785 Hyperlipidemia, unspecified: Secondary | ICD-10-CM | POA: Diagnosis present

## 2024-02-02 DIAGNOSIS — J449 Chronic obstructive pulmonary disease, unspecified: Secondary | ICD-10-CM | POA: Diagnosis present

## 2024-02-02 DIAGNOSIS — Z902 Acquired absence of lung [part of]: Secondary | ICD-10-CM

## 2024-02-02 DIAGNOSIS — Z833 Family history of diabetes mellitus: Secondary | ICD-10-CM

## 2024-02-02 LAB — CBC WITH DIFFERENTIAL/PLATELET
Abs Immature Granulocytes: 0.03 10*3/uL (ref 0.00–0.07)
Basophils Absolute: 0 10*3/uL (ref 0.0–0.1)
Basophils Relative: 1 %
Eosinophils Absolute: 0 10*3/uL (ref 0.0–0.5)
Eosinophils Relative: 0 %
HCT: 34 % — ABNORMAL LOW (ref 39.0–52.0)
Hemoglobin: 11.2 g/dL — ABNORMAL LOW (ref 13.0–17.0)
Immature Granulocytes: 1 %
Lymphocytes Relative: 35 %
Lymphs Abs: 1.8 10*3/uL (ref 0.7–4.0)
MCH: 32.2 pg (ref 26.0–34.0)
MCHC: 32.9 g/dL (ref 30.0–36.0)
MCV: 97.7 fL (ref 80.0–100.0)
Monocytes Absolute: 0.9 10*3/uL (ref 0.1–1.0)
Monocytes Relative: 17 %
Neutro Abs: 2.4 10*3/uL (ref 1.7–7.7)
Neutrophils Relative %: 46 %
Platelets: 152 10*3/uL (ref 150–400)
RBC: 3.48 MIL/uL — ABNORMAL LOW (ref 4.22–5.81)
RDW: 15.7 % — ABNORMAL HIGH (ref 11.5–15.5)
WBC: 5.2 10*3/uL (ref 4.0–10.5)
nRBC: 0 % (ref 0.0–0.2)

## 2024-02-02 LAB — COMPREHENSIVE METABOLIC PANEL WITH GFR
ALT: 14 U/L (ref 0–44)
AST: 16 U/L (ref 15–41)
Albumin: 2.9 g/dL — ABNORMAL LOW (ref 3.5–5.0)
Alkaline Phosphatase: 67 U/L (ref 38–126)
Anion gap: 11 (ref 5–15)
BUN: 52 mg/dL — ABNORMAL HIGH (ref 8–23)
CO2: 27 mmol/L (ref 22–32)
Calcium: 8.7 mg/dL — ABNORMAL LOW (ref 8.9–10.3)
Chloride: 97 mmol/L — ABNORMAL LOW (ref 98–111)
Creatinine, Ser: 1.86 mg/dL — ABNORMAL HIGH (ref 0.61–1.24)
GFR, Estimated: 38 mL/min — ABNORMAL LOW (ref >60)
Glucose, Bld: 156 mg/dL — ABNORMAL HIGH (ref 70–99)
Potassium: 3.9 mmol/L (ref 3.5–5.1)
Sodium: 135 mmol/L (ref 135–145)
Total Bilirubin: 0.9 mg/dL (ref 0.0–1.2)
Total Protein: 5.6 g/dL — ABNORMAL LOW (ref 6.5–8.1)

## 2024-02-02 LAB — MAGNESIUM: Magnesium: 1.9 mg/dL (ref 1.7–2.4)

## 2024-02-02 LAB — CBG MONITORING, ED: Glucose-Capillary: 158 mg/dL — ABNORMAL HIGH (ref 70–99)

## 2024-02-02 LAB — TROPONIN I (HIGH SENSITIVITY)
Troponin I (High Sensitivity): 10 ng/L (ref <18)
Troponin I (High Sensitivity): 9 ng/L (ref <18)

## 2024-02-02 MED ORDER — ONDANSETRON HCL 4 MG PO TABS: 4.0000 mg | ORAL_TABLET | Freq: Four times a day (QID) | ORAL | Status: DC | PRN

## 2024-02-02 MED ORDER — DORZOLAMIDE HCL 2 % OP SOLN
1.0000 [drp] | Freq: Two times a day (BID) | OPHTHALMIC | Status: DC
  Administered 2024-02-02 – 2024-02-05 (×6): 1 [drp] via OPHTHALMIC
  Filled 2024-02-02: qty 10

## 2024-02-02 MED ORDER — ENOXAPARIN SODIUM 40 MG/0.4ML IJ SOSY
40.0000 mg | PREFILLED_SYRINGE | Freq: Every day | INTRAMUSCULAR | Status: DC
  Administered 2024-02-02 – 2024-02-04 (×3): 40 mg via SUBCUTANEOUS
  Filled 2024-02-02 (×3): qty 0.4

## 2024-02-02 MED ORDER — SODIUM CHLORIDE 0.9% FLUSH: 10.0000 mL | INTRAVENOUS | Status: DC | PRN

## 2024-02-02 MED ORDER — GABAPENTIN 300 MG PO CAPS
300.0000 mg | ORAL_CAPSULE | Freq: Two times a day (BID) | ORAL | Status: DC
  Administered 2024-02-02 – 2024-02-05 (×6): 300 mg via ORAL
  Filled 2024-02-02 (×6): qty 1

## 2024-02-02 MED ORDER — SODIUM CHLORIDE 0.9 % IV BOLUS
1000.0000 mL | Freq: Once | INTRAVENOUS | Status: AC
  Administered 2024-02-02: 1000 mL via INTRAVENOUS

## 2024-02-02 MED ORDER — SODIUM CHLORIDE 0.9% FLUSH
10.0000 mL | INTRAVENOUS | Status: DC | PRN
  Administered 2024-02-05: 10 mL

## 2024-02-02 MED ORDER — SODIUM CHLORIDE 0.9 % IV SOLN: INTRAVENOUS | Status: AC

## 2024-02-02 MED ORDER — CHLORHEXIDINE GLUCONATE CLOTH 2 % EX PADS
6.0000 | MEDICATED_PAD | Freq: Every day | CUTANEOUS | Status: DC
  Administered 2024-02-02 – 2024-02-05 (×4): 6 via TOPICAL

## 2024-02-02 MED ORDER — SODIUM CHLORIDE 0.9% FLUSH
10.0000 mL | Freq: Two times a day (BID) | INTRAVENOUS | Status: DC
  Administered 2024-02-02 – 2024-02-05 (×5): 10 mL

## 2024-02-02 MED ORDER — SODIUM CHLORIDE 0.9 % IV SOLN
12.5000 mg | Freq: Once | INTRAVENOUS | Status: AC
  Administered 2024-02-02: 12.5 mg via INTRAVENOUS
  Filled 2024-02-02: qty 12.5

## 2024-02-02 MED ORDER — DORZOLAMIDE HCL 2 % OP SOLN
Freq: Two times a day (BID) | OPHTHALMIC | 2 refills | Status: DC
  Filled 2024-02-02: qty 10, 30d supply, fill #0

## 2024-02-02 MED ORDER — ONDANSETRON HCL 4 MG/2ML IJ SOLN
4.0000 mg | Freq: Four times a day (QID) | INTRAMUSCULAR | Status: DC | PRN
  Administered 2024-02-02 – 2024-02-03 (×2): 4 mg via INTRAVENOUS
  Filled 2024-02-02 (×2): qty 2

## 2024-02-02 MED ORDER — OXYCODONE HCL 5 MG PO TABS: 5.0000 mg | ORAL_TABLET | ORAL | Status: DC | PRN

## 2024-02-02 MED ORDER — SODIUM CHLORIDE 0.9% FLUSH
10.0000 mL | Freq: Two times a day (BID) | INTRAVENOUS | Status: DC
  Administered 2024-02-03 – 2024-02-05 (×5): 10 mL

## 2024-02-02 MED ORDER — FLUTICASONE FUROATE-VILANTEROL 100-25 MCG/ACT IN AEPB
1.0000 | INHALATION_SPRAY | Freq: Every day | RESPIRATORY_TRACT | Status: DC
  Administered 2024-02-03 – 2024-02-05 (×3): 1 via RESPIRATORY_TRACT
  Filled 2024-02-02: qty 28

## 2024-02-02 MED ORDER — SODIUM CHLORIDE 0.9 % IV SOLN: INTRAVENOUS | Status: DC

## 2024-02-02 NOTE — Telephone Encounter (Signed)
 Wife concerned he has Ovlaocytes in his blood. I told wife that they are present in his blood and having a few  in the blood are normal . I reassured her that .

## 2024-02-02 NOTE — H&P (Signed)
 [facility] H&P    Patient Demographics:    Cameron [name], is a 73 y.o. male  MRN: [id number]  DOB - Jul 09, 1951  Admit Date - 02/02/2024  Referring MD/NP/PA: Russella Courts  Outpatient Primary MD for the patient is Dorothe Gaster, NP  Patient coming from: Outpatient pharmacy  Chief complaint-passed out   HPI:    Cameron [name]  is a 73 y.o. male, with history of severe stage IIA squamous cell carcinoma diagnosed in January 2025 status post left upper lobectomy and lymph node dissection on October 03, 2023, undergoing chemotherapy.  Last chemo was a week ago.  Patient says that since last chemo he has had very poor p.o. intake and had 2 episodes of syncope.  First episode was on Saturday when he was in shower.  Today he had second episode of syncope while he was waiting at outpatient pharmacy to pick up eyedrops prescribed by patient's wife's retina specialist. Patient says that he does not remember events after he passed out in pharmacy.  He remembers waking up in the ED. He was seen in the ED on 5/3 after syncopal episode in the shower at that time he was given IV fluids and discharged home.  Patient says that he has had very poor p.o. intake since that time. In the ED was found to have acute kidney injury with BUN 52, creatinine 1.86, baseline creatinine 1.08. He denies chest pain or shortness of breath Had episode of vomiting, denies diarrhea Denies abdominal pain     Review of systems:    In addition to the HPI above,    All other systems reviewed and are negative.    Past History of the following :    Past Medical History:  Diagnosis Date   Adenomatous colon polyp 05/2009; 11/2014   2010:Tubular adenoma, no high grade dysplasia.[name]  + Hyperplastic polyps. 2016: Tubular adenoma-rpt 5 yrs.   Cancer (HCC)    TURP for prostate cancer   COPD (chronic obstructive pulmonary disease) (HCC)    Coronary artery disease     Difficult intubation    H/O chronic gastritis 05/2009   EGD: no h.pylori,dysplasia,or evidence of malignancy   Hyperlipidemia    lovaza from prior PMD-stopped due to cost   Hypertension    Obesity, Class I, BMI 30.0-34.9 (see actual BMI) 04/23/2014   Tobacco dependence    Ventral hernia    small      Past Surgical History:  Procedure Laterality Date   APPENDECTOMY  09/28/1971   BIOPSY  08/15/2023   Procedure: BIOPSY;  Surgeon: Prudy Brownie, DO;  Location: [facility];  Service: Pulmonary;;   BLADDER DIVERTICULECTOMY N/A 09/30/2022   Procedure: BLADDER DIVERTICULECTOMY;  Surgeon: Florencio Hunting, MD;  Location: [facility];  Service: Urology;  Laterality: N/A;   CARPAL TUNNEL RELEASE Left    CHOLECYSTECTOMY N/A 07/06/2018   Procedure: LAPAROSCOPIC CHOLECYSTECTOMY WITH INTRAOPERATIVE CHOLANGIOGRAM;  Surgeon: Juanita Norlander, MD;  Location: [facility] OR;  Service: General;  Laterality: N/A;   COLONOSCOPY  09/27/2008   [facility]   CYSTOSCOPY  N/A 09/30/2022   Procedure: CYSTOSCOPY FLEXIBLE;  Surgeon: Florencio Hunting, MD;  Location: [facility];  Service: Urology;  Laterality: N/A;   EYE SURGERY  2018   Cataracts removed only   FINGER SURGERY Left 12/2021   Left middle finger, laceration   HEMOSTASIS CONTROL  08/15/2023   Procedure: HEMOSTASIS CONTROL;  Surgeon: Prudy Brownie, DO;  Location: [facility];  Service: Pulmonary;;   INTERCOSTAL NERVE BLOCK Left 10/03/2023   Procedure: INTERCOSTAL NERVE BLOCK;  Surgeon: Zelphia Higashi, MD;  Location: [facility] OR;  Service: Thoracic;  Laterality: Left;   IR IMAGING GUIDED PORT INSERTION  12/08/2023   LYMPH NODE BIOPSY Left 10/03/2023   Procedure: LYMPH NODE BIOPSY;  Surgeon: Zelphia Higashi, MD;  Location: [facility];  Service: Thoracic;  Laterality: Left;   ROBOT ASSISTED LAPAROSCOPIC RADICAL PROSTATECTOMY N/A 09/30/2022   Procedure: XI ROBOTIC ASSISTED LAPAROSCOPIC RADICAL PROSTATECTOMY LEVEL 3;  Surgeon: Florencio Hunting, MD;  Location: [facility];  Service:  Urology;  Laterality: N/A;  270 MINUTES NEEDED FOR CASE   STRABISMUS SURGERY  age 71 or 9   TRANSURETHRAL RESECTION OF PROSTATE N/A 07/08/2022   Procedure: TRANSURETHRAL RESECTION OF THE PROSTATE (TURP)/ CYSTOSCOPY;  Surgeon: Florencio Hunting, MD;  Location: [facility];  Service: Urology;  Laterality: N/A;   VIDEO BRONCHOSCOPY N/A 08/15/2023   Procedure: VIDEO BRONCHOSCOPY WITHOUT FLUORO;  Surgeon: Prudy Brownie, DO;  Location: [facility];  Service: Pulmonary;  Laterality: N/A;      Social History:      Social History   Tobacco Use   Smoking status: Every Day    Current packs/day: 1.00    Average packs/day: 1 pack/day for 40.0 years (40.0 ttl pk-yrs)    Types: Cigarettes   Smokeless tobacco: Never   Tobacco comments:    Maybe 1-1.5 cigarettes per day since 09/09/23  Substance Use Topics   Alcohol use: Not Currently       Family History :     Family History  Problem Relation Age of Onset   Diabetes Mother    Heart disease Mother    COPD Father    Hypertension Father    Hyperlipidemia Father    Heart attack Father    Dementia Father    Alcohol abuse Maternal Grandfather       Home Medications:   Prior to Admission medications   Medication Sig Start Date End Date Taking? Authorizing Provider  acetaminophen  (TYLENOL ) 500 MG tablet Take 1-2 tablets (500-1,000 mg total) by mouth every 6 (six) hours as needed. 10/06/23  Yes Barrett, Erin R, PA-C  atorvastatin  (LIPITOR) 20 MG tablet Take 1 tablet (20 mg total) by mouth daily. 12/21/23  Yes Dorothe Gaster, NP  benazepril  (LOTENSIN ) 40 MG tablet Take 1 tablet (40 mg total) by mouth daily. 12/21/23  Yes Dorothe Gaster, NP  dexamethasone  (DECADRON ) 4 MG tablet Take 2 tabs (8 mg) twice daily starting the day before docetaxel , then daily x 3 days after cisplatin .Take with food. 11/10/23  Yes Marlene Simas, MD  dorzolamide (TRUSOPT) 2 % ophthalmic solution Administer into right eye twice a day. 02/02/24  Yes   fluticasone  (FLONASE ) 50  MCG/ACT nasal spray Place 1 spray into both nostrils daily.   Yes [provider]  fluticasone -salmeterol (ADVAIR  DISKUS) 250-50 MCG/ACT AEPB Inhale 1 puff into the lungs in the morning and at bedtime. 12/22/23  Yes Dorothe Gaster, NP  gabapentin  (NEURONTIN ) 300 MG capsule Take 1 capsule (300 mg total) by mouth 2 (two)  times daily. 10/06/23 10/05/24 Yes Barrett, Erin R, PA-C  hydrochlorothiazide  (MICROZIDE ) 12.5 MG capsule Take 1 capsule (12.5 mg total) by mouth daily. 12/21/23  Yes Dorothe Gaster, NP  ibuprofen (ADVIL) 800 MG tablet Take 800 mg by mouth every 8 (eight) hours as needed (pain.).   Yes [provider]  lidocaine -prilocaine  (EMLA ) cream Apply to the Port-A-Cath site 30-60 minutes before chemotherapy 12/14/23  Yes Marlene Simas, MD  metoprolol  tartrate (LOPRESSOR ) 25 MG tablet Take 1/2 tablet (12.5 mg total) by mouth 2 (two) times daily. 12/22/23  Yes Dorothe Gaster, NP  ondansetron  (ZOFRAN ) 8 MG tablet Take 1 tablet (8 mg total) by mouth every 8 (eight) hours as needed for nausea or vomiting. Begin on the third day after cisplatin  chemotherapy. 11/10/23  Yes Marlene Simas, MD  oxyCODONE  (OXY IR/ROXICODONE ) 5 MG immediate release tablet Take 1-2 tablets (5-10 mg total) by mouth every 4 (four) hours as needed for moderate pain (pain score 4-6). 10/06/23  Yes Barrett, Erin R, PA-C  valACYclovir (VALTREX) 1000 MG tablet Take 1,000 mg by mouth 2 (two) times daily as needed (fever blisters/outbreaks). 04/05/23  Yes [provider]  amiodarone  (PACERONE ) 200 MG tablet Take 1 tablet (200 mg total) by mouth 2 (two) times daily for 10 days, then decrease to 1 tablet (200 mg) daily. Patient not taking: Reported on 02/02/2024 10/06/23 11/25/23  Barrett, Malachy Scripture, PA-C  Ascorbic Acid (VITAMIN C PO) Take 1 tablet by mouth in the morning. Patient not taking: Reported on 02/02/2024    [provider]  hydrocortisone  ointment 0.5 % Apply 1 Application topically 2 (two) times  daily. Patient not taking: Reported on 02/02/2024 12/01/23   Walisiewicz, Kaitlyn E, PA-C  MAGNESIUM  PO Take 250 mg by mouth in the morning. Magnesium  Carbonate OTC Patient not taking: Reported on 02/02/2024    [provider]  chlorproMAZINE  (THORAZINE ) 25 MG tablet Take 1 tablet (25 mg total) by mouth 3 (three) times daily as needed for hiccoughs. 01/11/24 01/30/24  Marlene Simas, MD     Allergies:     Allergies  Allergen Reactions   Compazine  [Prochlorperazine ] Diarrhea    syncope     Physical Exam:   Vitals  Blood pressure 120/64, pulse 74, temperature 98 F (36.7 C), temperature source Oral, resp. rate 16, height 5\' 11"  (1.803 m), weight 81.6 kg, SpO2 100%.  1.  General: Appears in no acute distress  2. Psychiatric: Alert, oriented x 3, intact insight and judgment  3. Neurologic: Cranial nerves II through XII grossly intact, no focal deficit noted  4. HEENMT:  Atraumatic normocephalic, extraocular muscles are intact  5. Respiratory : Lungs are clear to auscultation bilaterally  6. Cardiovascular : S1-S2, regular, no murmur auscultated  7. Gastrointestinal:  Abdomen is soft, nontender, no organomegaly      Data Review:    CBC Recent Labs  Lab 01/28/24 1007 02/01/24 1452 02/02/24 1353  WBC 34.0* 2.6* 5.2  HGB 10.8* 11.5* 11.2*  HCT 32.5* 32.7* 34.0*  PLT 205 155 152  MCV 96.4 92.6 97.7  MCH 32.0 32.6 32.2  MCHC 33.2 35.2 32.9  RDW 16.2* 15.9* 15.7*  LYMPHSABS 0.6* 1.0 PENDING  MONOABS 0.3 0.4 PENDING  EOSABS 0.0 0.0 PENDING  BASOSABS 0.1 0.0 PENDING   ------------------------------------------------------------------------------------------------------------------  Results for orders placed or performed during the hospital encounter of 02/02/24 (from the past 48 hours)  CBG monitoring, ED     Status: Abnormal   Collection Time: 02/02/24  1:50 PM  Result Value Ref Range   Glucose-Capillary 158 (H) 70 - 99 mg/dL    Comment: Glucose  reference range applies only to samples taken after fasting for at least 8 hours.   Comment 1 Notify RN   CBC with Differential     Status: Abnormal (Preliminary result)   Collection Time: 02/02/24  1:53 PM  Result Value Ref Range   WBC 5.2 4.0 - 10.5 K/uL   RBC 3.48 (L) 4.22 - 5.81 MIL/uL   Hemoglobin 11.2 (L) 13.0 - 17.0 g/dL   HCT 16.1 (L) 09.6 - 04.5 %   MCV 97.7 80.0 - 100.0 fL   MCH 32.2 26.0 - 34.0 pg   MCHC 32.9 30.0 - 36.0 g/dL   RDW 40.9 (H) 81.1 - 91.4 %   Platelets 152 150 - 400 K/uL   nRBC 0.0 0.0 - 0.2 %    Comment: Performed at [facility], [address], [place], [place] [address]   Neutrophils Relative % PENDING %   Neutro Abs PENDING 1.7 - 7.7 K/uL   Band Neutrophils PENDING %   Lymphocytes Relative PENDING %   Lymphs Abs PENDING 0.7 - 4.0 K/uL   Monocytes Relative PENDING %   Monocytes Absolute PENDING 0.1 - 1.0 K/uL   Eosinophils Relative PENDING %   Eosinophils Absolute PENDING 0.0 - 0.5 K/uL   Basophils Relative PENDING %   Basophils Absolute PENDING 0.0 - 0.1 K/uL   WBC Morphology PENDING    RBC Morphology PENDING    Smear Review PENDING    Other PENDING %   nRBC PENDING 0 /100 WBC   Metamyelocytes Relative PENDING %   Myelocytes PENDING %   Promyelocytes Relative PENDING %   Blasts PENDING %   Immature Granulocytes PENDING %   Abs Immature Granulocytes PENDING 0.00 - 0.07 K/uL  Comprehensive metabolic panel     Status: Abnormal   Collection Time: 02/02/24  1:53 PM  Result Value Ref Range   Sodium 135 135 - 145 mmol/L   Potassium 3.9 3.5 - 5.1 mmol/L   Chloride 97 (L) 98 - 111 mmol/L   CO2 27 22 - 32 mmol/L   Glucose, Bld 156 (H) 70 - 99 mg/dL    Comment: Glucose reference range applies only to samples taken after fasting for at least 8 hours.   BUN 52 (H) 8 - 23 mg/dL   Creatinine, Ser 6.21 (H) 0.61 - 1.24 mg/dL   Calcium  8.7 (L) 8.9 - 10.3 mg/dL   Total Protein 5.6 (L) 6.5 - 8.1 g/dL   Albumin  2.9 (L) 3.5 - 5.0 g/dL    AST 16 15 - 41 U/L   ALT 14 0 - 44 U/L   Alkaline Phosphatase 67 38 - 126 U/L   Total Bilirubin 0.9 0.0 - 1.2 mg/dL   GFR, Estimated 38 (L) >60 mL/min    Comment: (NOTE) Calculated using the CKD-EPI Creatinine Equation (2021)    Anion gap 11 5 - 15    Comment: Performed at [facility], [address], [place], [place] [address]  Troponin I (High Sensitivity)     Status: None   Collection Time: 02/02/24  1:53 PM  Result Value Ref Range   Troponin I (High Sensitivity) 10 <18 ng/L    Comment: (NOTE) Elevated high sensitivity troponin I (hsTnI) values and significant  changes across serial measurements may suggest ACS but many other  chronic and acute conditions are known to elevate hsTnI results.  Refer  to the "Links" section for chest pain algorithms and additional  guidance. Performed at [facility], [address] [place], [place] [address]   Magnesium      Status: None   Collection Time: 02/02/24  1:53 PM  Result Value Ref Range   Magnesium  1.9 1.7 - 2.4 mg/dL    Comment: Performed at [facility], [address] [place], [place] [address]    Chemistries  Recent Labs  Lab 01/28/24 1007 02/01/24 1452 02/02/24 1353  NA 132* 138 135  K 3.5 3.5 3.9  CL 98 98 97*  CO2 25 31 27   GLUCOSE 132* 133* 156*  BUN 31* 46* 52*  CREATININE 1.08 1.32* 1.86*  CALCIUM  8.1* 8.5* 8.7*  MG  --  1.9 1.9  AST 23 11* 16  ALT 19 12 14   ALKPHOS 63 70 67  BILITOT 0.7 0.8 0.9   ------------------------------------------------------------------------------------------------------------------  ------------------------------------------------------------------------------------------------------------------ GFR: Estimated Creatinine Clearance: 39.4 mL/min (A) (by C-G formula based on SCr of 1.86 mg/dL (H)). Liver Function Tests: Recent Labs  Lab 01/28/24 1007 02/01/24 1452 02/02/24 1353  AST 23 11* 16  ALT 19 12 14   ALKPHOS  63 70 67  BILITOT 0.7 0.8 0.9  PROT 5.2* 5.8* 5.6*  ALBUMIN  2.9* 3.4* 2.9*   No results for input(s): "LIPASE", "AMYLASE" in the last 168 hours. No results for input(s): "AMMONIA" in the last 168 hours. Coagulation Profile: No results for input(s): "INR", "PROTIME" in the last 168 hours. Cardiac Enzymes: No results for input(s): "CKTOTAL", "CKMB", "CKMBINDEX", "TROPONINI" in the last 168 hours. BNP (last 3 results) No results for input(s): "PROBNP" in the last 8760 hours. HbA1C: No results for input(s): "HGBA1C" in the last 72 hours. CBG: Recent Labs  Lab 01/28/24 0921 02/02/24 1350  GLUCAP 121* 158*   Lipid Profile: No results for input(s): "CHOL", "HDL", "LDLCALC", "TRIG", "CHOLHDL", "LDLDIRECT" in the last 72 hours. Thyroid  Function Tests: No results for input(s): "TSH", "T4TOTAL", "FREET4", "T3FREE", "THYROIDAB" in the last 72 hours. Anemia Panel: No results for input(s): "VITAMINB12", "FOLATE", "FERRITIN", "TIBC", "IRON", "RETICCTPCT" in the last 72 hours.  --------------------------------------------------------------------------------------------------------------- Urine analysis:    Component Value Date/Time   COLORURINE AMBER (A) 09/29/2023 1050   APPEARANCEUR TURBID (A) 09/29/2023 1050   LABSPEC 1.014 09/29/2023 1050   PHURINE 5.0 09/29/2023 1050   GLUCOSEU NEGATIVE 09/29/2023 1050   GLUCOSEU NEGATIVE 04/28/2022 1157   HGBUR SMALL (A) 09/29/2023 1050   BILIRUBINUR NEGATIVE 09/29/2023 1050   BILIRUBINUR negative 04/28/2022 1130   BILIRUBINUR N 05/22/2018 0917   KETONESUR NEGATIVE 09/29/2023 1050   PROTEINUR 30 (A) 09/29/2023 1050   UROBILINOGEN 0.2 04/28/2022 1157   UROBILINOGEN 0.2 04/28/2022 1130   NITRITE NEGATIVE 09/29/2023 1050   LEUKOCYTESUR LARGE (A) 09/29/2023 1050      Imaging Results:    DG Chest Portable 1 View Result Date: 02/02/2024 CLINICAL DATA:  Syncope. EXAM: PORTABLE CHEST 1 VIEW COMPARISON:  December 06, 2023. FINDINGS: The heart size and  mediastinal contours are within normal limits. Right internal jugular Port-A-Cath is noted in grossly good position. Both lungs are clear. The visualized skeletal structures are unremarkable. IMPRESSION: No active disease. Electronically Signed   By: Rosalene Colon M.D.   On: 02/02/2024 14:33    My personal review of EKG: Rhythm NSR, nonspecific T wave abnormalities   Assessment & Plan:    Principal Problem:   Syncope   Syncope-likely in setting of dehydration, acute kidney injury.  Patient given IV fluid bolus in  the ED.  Will continue IV normal saline at 100 mL/h.  Check orthostatic vital signs every 4 hours x 3.  Will monitor closely on telemetry.  Will obtain echocardiogram Acute kidney injury-likely in setting of poor p.o. intake/dehydration.  Patient says that he has had poor p.o. intake since last chemo a week ago.  Started on IV fluids as above.  Will follow BUN/creatinine a.m. Stage II non-small cell lung cancer-patient getting chemotherapy as outpatient per Dr. Marguerita Shih.  Dr. Georjean Kite has been informed of patient's admission in the hospital. Hypertension-patient takes losartan, metoprolol  and HCTZ at home.  Will hold these medications at this time due to acute kidney injury/dehydration as above. COPD-will continue Advair     DVT Prophylaxis-   Lovenox    AM Labs Ordered, also please review Full Orders  Family Communication: Admission, patients condition and plan of care including tests being ordered have been discussed with the patient who indicate understanding and agree with the plan and Code Status.  Code Status: Full code  Admission status: Observation     * I certify that at the point of admission it is my clinical judgment that the patient will require inpatient hospital care spanning beyond 2 midnights from the point of admission due to high intensity of service, high risk for further deterioration and high frequency of surveillance required.*  Time spent in minutes :  60 min   Nafisah Runions S Alvan Culpepper M.D

## 2024-02-02 NOTE — Telephone Encounter (Signed)
 Dr Lydia Sams told pt that his RIGHT eye is bulging with fluid. He is starting eye drops. Sharron said he can now see colors correctly.

## 2024-02-02 NOTE — Telephone Encounter (Signed)
 Wife called re Vision changes -Today ,Yaacov told Thersia Flax that the "red" stop light and brake lights looked "yellow" and the "yellow" stop light looked "red" . Per Dr Marguerita Shih I instructed Thersia Flax that Ruble needs to see an ophthalmologist.She is calling her retina specialist to see if Maylon can be seen.  He is still weak and she thinks he may need IVF because he is not drinking or eating much.

## 2024-02-02 NOTE — Telephone Encounter (Signed)
 Cameron [name] said Cameron [name] is was taken to ED after unresponsiveness  .

## 2024-02-02 NOTE — ED Provider Notes (Signed)
 [place] EMERGENCY DEPARTMENT AT [facility] Provider Note  CSN: [id number] Arrival date & time: 02/02/24 1347  Chief Complaint(s) Loss of Consciousness  HPI CURBY BADY is a 73 y.o. male with past medical history as below, significant for COPD, prostate cancer, lung cancer undergoing chemo dr Marguerita Shih, HLD, who presents to the ED with complaint of hypotension, syncope.   Patient here with spouse, reports poor p.o. intake over the past week.  He was seen on 5/3 after syncopal episode in the shower.  Symptoms improved after IV fluids, feeling better after evaluation and discharged in stable condition.  Patient has had multiple further syncopal episodes over the past few days.  Minimal p.o. intake.  Vomiting.  No fevers or chills.  No diarrhea.  No chest pain or dyspnea.  Past Medical History Past Medical History:  Diagnosis Date   Adenomatous colon polyp 05/2009; 11/2014   2010:Tubular adenoma, no high grade dysplasia.[name]  + Hyperplastic polyps. 2016: Tubular adenoma-rpt 5 yrs.   Cancer (HCC)    TURP for prostate cancer   COPD (chronic obstructive pulmonary disease) (HCC)    Coronary artery disease    Difficult intubation    H/O chronic gastritis 05/2009   EGD: no h.pylori,dysplasia,or evidence of malignancy   Hyperlipidemia    lovaza from prior PMD-stopped due to cost   Hypertension    Obesity, Class I, BMI 30.0-34.9 (see actual BMI) 04/23/2014   Tobacco dependence    Ventral hernia    small   Patient Active Problem List   Diagnosis Date Noted   Port-A-Cath in place 12/28/2023   Primary squamous cell carcinoma of bronchus of left upper lobe (HCC) 11/10/2023   S/P lobectomy of lung 10/03/2023   Lung nodule 08/15/2023   Acute respiratory infection 06/28/2023   Loss of equilibrium 11/25/2022   Insomnia 11/25/2022   History of polycythemia 11/25/2022   Benign prostatic hyperplasia 07/08/2022   Acute cough 06/23/2022   Upper respiratory tract infection 06/23/2022    Preventative health care 05/04/2022   Abnormal hemoglobin (Hgb) (HCC) 04/08/2020   Need for hepatitis C screening test 04/26/2018   Centrilobular emphysema (HCC) 04/20/2018   Polycythemia 04/20/2018   CAD (coronary artery disease) 03/31/2018   Visit for preventive health examination 02/27/2018   Overweight (BMI 25.0-29.9) 04/23/2014   Tobacco dependence 12/29/2012   Health maintenance examination 03/13/2012   Hyperlipemia, mixed 03/13/2012   HTN (hypertension), benign 02/11/2012   Home Medication(s) Prior to Admission medications   Medication Sig Start Date End Date Taking? Authorizing Provider  acetaminophen  (TYLENOL ) 500 MG tablet Take 1-2 tablets (500-1,000 mg total) by mouth every 6 (six) hours as needed. 10/06/23   Barrett, Erin R, PA-C  amiodarone  (PACERONE ) 200 MG tablet Take 1 tablet (200 mg total) by mouth 2 (two) times daily for 10 days, then decrease to 1 tablet (200 mg) daily. 10/06/23 11/25/23  Barrett, Malachy Scripture, PA-C  Ascorbic Acid (VITAMIN C PO) Take 1 tablet by mouth in the morning.    [provider]  atorvastatin  (LIPITOR) 20 MG tablet Take 1 tablet (20 mg total) by mouth daily. 12/21/23   Dorothe Gaster, NP  benazepril  (LOTENSIN ) 40 MG tablet Take 1 tablet (40 mg total) by mouth daily. 12/21/23   Dorothe Gaster, NP  dexamethasone  (DECADRON ) 4 MG tablet Take 2 tabs (8 mg) twice daily starting the day before docetaxel , then daily x 3 days after cisplatin .Take with food. 11/10/23   Marlene Simas, MD  dorzolamide (TRUSOPT) 2 %  ophthalmic solution Administer into right eye twice a day. 02/02/24     fluticasone  (FLONASE ) 50 MCG/ACT nasal spray Place 1 spray into both nostrils daily.    [provider]  fluticasone -salmeterol (ADVAIR  DISKUS) 250-50 MCG/ACT AEPB Inhale 1 puff into the lungs in the morning and at bedtime. 12/22/23   Dorothe Gaster, NP  FLUZONE HIGH-DOSE 0.5 ML injection  09/19/23   [provider]  gabapentin  (NEURONTIN ) 300 MG capsule Take 1  capsule (300 mg total) by mouth 2 (two) times daily. 10/06/23 10/05/24  Barrett, Malachy Scripture, PA-C  hydrochlorothiazide  (MICROZIDE ) 12.5 MG capsule Take 1 capsule (12.5 mg total) by mouth daily. 12/21/23   Dorothe Gaster, NP  hydrocortisone  ointment 0.5 % Apply 1 Application topically 2 (two) times daily. 12/01/23   Walisiewicz, Kaitlyn E, PA-C  ibuprofen (ADVIL) 800 MG tablet Take 800 mg by mouth every 8 (eight) hours as needed (pain.).    [provider]  lidocaine -prilocaine  (EMLA ) cream Apply to the Port-A-Cath site 30-60 minutes before chemotherapy 12/14/23   Marlene Simas, MD  MAGNESIUM  PO Take 250 mg by mouth in the morning. Magnesium  Carbonate OTC    [provider]  metoprolol  tartrate (LOPRESSOR ) 25 MG tablet Take 1/2 tablet (12.5 mg total) by mouth 2 (two) times daily. 12/22/23   Dorothe Gaster, NP  ondansetron  (ZOFRAN ) 8 MG tablet Take 1 tablet (8 mg total) by mouth every 8 (eight) hours as needed for nausea or vomiting. Begin on the third day after cisplatin  chemotherapy. 11/10/23   Marlene Simas, MD  oxyCODONE  (OXY IR/ROXICODONE ) 5 MG immediate release tablet Take 1-2 tablets (5-10 mg total) by mouth every 4 (four) hours as needed for moderate pain (pain score 4-6). 10/06/23   Barrett, Malachy Scripture, PA-C  valACYclovir (VALTREX) 1000 MG tablet Take 1,000 mg by mouth 2 (two) times daily as needed (fever blisters/outbreaks). 04/05/23   [provider]  chlorproMAZINE  (THORAZINE ) 25 MG tablet Take 1 tablet (25 mg total) by mouth 3 (three) times daily as needed for hiccoughs. 01/11/24 01/30/24  Marlene Simas, MD                                                                                                                                    Past Surgical History Past Surgical History:  Procedure Laterality Date   APPENDECTOMY  09/28/1971   BIOPSY  08/15/2023   Procedure: BIOPSY;  Surgeon: Prudy Brownie, DO;  Location: [facility];  Service: Pulmonary;;   BLADDER  DIVERTICULECTOMY N/A 09/30/2022   Procedure: BLADDER DIVERTICULECTOMY;  Surgeon: Florencio Hunting, MD;  Location: [facility];  Service: Urology;  Laterality: N/A;   CARPAL TUNNEL RELEASE Left    CHOLECYSTECTOMY N/A 07/06/2018   Procedure: LAPAROSCOPIC CHOLECYSTECTOMY WITH INTRAOPERATIVE CHOLANGIOGRAM;  Surgeon: Juanita Norlander, MD;  Location: [facility] OR;  Service: General;  Laterality: N/A;   COLONOSCOPY  09/27/2008   [facility]   CYSTOSCOPY N/A 09/30/2022  Procedure: CYSTOSCOPY FLEXIBLE;  Surgeon: Florencio Hunting, MD;  Location: [facility];  Service: Urology;  Laterality: N/A;   EYE SURGERY  2018   Cataracts removed only   FINGER SURGERY Left 12/2021   Left middle finger, laceration   HEMOSTASIS CONTROL  08/15/2023   Procedure: HEMOSTASIS CONTROL;  Surgeon: Prudy Brownie, DO;  Location: [facility];  Service: Pulmonary;;   INTERCOSTAL NERVE BLOCK Left 10/03/2023   Procedure: INTERCOSTAL NERVE BLOCK;  Surgeon: Zelphia Higashi, MD;  Location: [facility] OR;  Service: Thoracic;  Laterality: Left;   IR IMAGING GUIDED PORT INSERTION  12/08/2023   LYMPH NODE BIOPSY Left 10/03/2023   Procedure: LYMPH NODE BIOPSY;  Surgeon: Zelphia Higashi, MD;  Location: [facility] OR;  Service: Thoracic;  Laterality: Left;   ROBOT ASSISTED LAPAROSCOPIC RADICAL PROSTATECTOMY N/A 09/30/2022   Procedure: XI ROBOTIC ASSISTED LAPAROSCOPIC RADICAL PROSTATECTOMY LEVEL 3;  Surgeon: Florencio Hunting, MD;  Location: [facility];  Service: Urology;  Laterality: N/A;  270 MINUTES NEEDED FOR CASE   STRABISMUS SURGERY  age 54 or 9   TRANSURETHRAL RESECTION OF PROSTATE N/A 07/08/2022   Procedure: TRANSURETHRAL RESECTION OF THE PROSTATE (TURP)/ CYSTOSCOPY;  Surgeon: Florencio Hunting, MD;  Location: [facility];  Service: Urology;  Laterality: N/A;   VIDEO BRONCHOSCOPY N/A 08/15/2023   Procedure: VIDEO BRONCHOSCOPY WITHOUT FLUORO;  Surgeon: Prudy Brownie, DO;  Location: [facility];  Service: Pulmonary;  Laterality: N/A;   Family History Family History  Problem  Relation Age of Onset   Diabetes Mother    Heart disease Mother    COPD Father    Hypertension Father    Hyperlipidemia Father    Heart attack Father    Dementia Father    Alcohol abuse Maternal Grandfather     Social History Social History   Tobacco Use   Smoking status: Every Day    Current packs/day: 1.00    Average packs/day: 1 pack/day for 40.0 years (40.0 ttl pk-yrs)    Types: Cigarettes   Smokeless tobacco: Never   Tobacco comments:    Maybe 1-1.5 cigarettes per day since 09/09/23  Vaping Use   Vaping status: Never Used  Substance Use Topics   Alcohol use: Not Currently   Drug use: No   Allergies Compazine  [prochlorperazine ]  Review of Systems A thorough review of systems was obtained and all systems are negative except as noted in the HPI and PMH.   Physical Exam Vital Signs  I have reviewed the triage vital signs BP 120/64   Pulse 74   Temp 98 F (36.7 C) (Oral)   Resp 16   Ht 5\' 11"  (1.803 m)   Wt 81.6 kg   SpO2 100%   BMI 25.10 kg/m  Physical Exam Vitals and nursing note reviewed.  Constitutional:      General: He is in acute distress.     Appearance: Normal appearance. He is well-developed. He is not ill-appearing.  HENT:     Head: Normocephalic and atraumatic.     Right Ear: External ear normal.     Left Ear: External ear normal.     Nose: Nose normal.     Mouth/Throat:     Mouth: Mucous membranes are dry.  Eyes:     General: No scleral icterus.       Right eye: No discharge.        Left eye: No discharge.  Cardiovascular:     Rate and Rhythm: Normal rate.  Pulmonary:     Effort:  Pulmonary effort is normal. No respiratory distress.     Breath sounds: No stridor.  Chest:     Comments: Portacath  Abdominal:     General: Abdomen is flat. There is no distension.     Palpations: Abdomen is soft.     Tenderness: There is no guarding.  Musculoskeletal:        General: No deformity.     Cervical back: No rigidity.  Skin:    General:  Skin is warm and dry.     Coloration: Skin is not cyanotic, jaundiced or pale.  Neurological:     Mental Status: He is alert and oriented to person, place, and time.     GCS: GCS eye subscore is 4. GCS verbal subscore is 5. GCS motor subscore is 6.  Psychiatric:        Speech: Speech normal.        Behavior: Behavior normal. Behavior is cooperative.     ED Results and Treatments Labs (all labs ordered are listed, but only abnormal results are displayed) Labs Reviewed  CBC WITH DIFFERENTIAL/PLATELET - Abnormal; Notable for the following components:      Result Value   RBC 3.48 (*)    Hemoglobin 11.2 (*)    HCT 34.0 (*)    RDW 15.7 (*)    All other components within normal limits  COMPREHENSIVE METABOLIC PANEL WITH GFR - Abnormal; Notable for the following components:   Chloride 97 (*)    Glucose, Bld 156 (*)    BUN 52 (*)    Creatinine, Ser 1.86 (*)    Calcium  8.7 (*)    Total Protein 5.6 (*)    Albumin  2.9 (*)    GFR, Estimated 38 (*)    All other components within normal limits  CBG MONITORING, ED - Abnormal; Notable for the following components:   Glucose-Capillary 158 (*)    All other components within normal limits  MAGNESIUM   TROPONIN I (HIGH SENSITIVITY)  TROPONIN I (HIGH SENSITIVITY)                                                                                                                          Radiology DG Chest Portable 1 View Result Date: 02/02/2024 CLINICAL DATA:  Syncope. EXAM: PORTABLE CHEST 1 VIEW COMPARISON:  December 06, 2023. FINDINGS: The heart size and mediastinal contours are within normal limits. Right internal jugular Port-A-Cath is noted in grossly good position. Both lungs are clear. The visualized skeletal structures are unremarkable. IMPRESSION: No active disease. Electronically Signed   By: Rosalene Colon M.D.   On: 02/02/2024 14:33    Pertinent labs & imaging results that were available during my care of the patient were reviewed by me and  considered in my medical decision making (see MDM for details).  Medications Ordered in ED Medications  0.9 %  sodium chloride  infusion ( Intravenous New Bag/Given 02/02/24 1516)  sodium chloride  0.9 % bolus 1,000 mL (1,000 mLs Intravenous  New Bag/Given 02/02/24 1434)  sodium chloride  0.9 % bolus 1,000 mL (1,000 mLs Intravenous New Bag/Given 02/02/24 1434)                                                                                                                                     Procedures .Critical Care  Performed by: Teddi Favors, DO Authorized by: Teddi Favors, DO   Critical care provider statement:    Critical care time (minutes):  47   Critical care time was exclusive of:  Separately billable procedures and treating other patients   Critical care was necessary to treat or prevent imminent or life-threatening deterioration of the following conditions:  Dehydration   Critical care was time spent personally by me on the following activities:  Development of treatment plan with patient or surrogate, discussions with consultants, evaluation of patient's response to treatment, examination of patient, ordering and review of laboratory studies, ordering and review of radiographic studies, ordering and performing treatments and interventions, pulse oximetry, re-evaluation of patient's condition, review of old charts and obtaining history from patient or surrogate   Care discussed with: admitting provider     (including critical care time)  Medical Decision Making / ED Course    Medical Decision Making:    KOHL LAUDERMAN is a 72 y.o. male  with past medical history as below, significant for COPD, prostate cancer, lung cancer undergoing chemo dr Marguerita Shih, HLD, who presents to the ED with complaint of hypotension, syncope. . The complaint involves an extensive differential diagnosis and also carries with it a high risk of complications and morbidity.  Serious etiology was considered. Ddx  includes but is not limited to: Dehydration, vasovagal syncope, orthostatic hypotension, electrolyte derangement, side effect from chemotherapy, etc.  Complete initial physical exam performed, notably the patient was in acute distress, profoundly hypotensive.    Reviewed and confirmed nursing documentation for past medical history, family history, social history.  Vital signs reviewed.     Brief summary:  73 year old male history as above including COPD, lung cancer currently undergoing chemotherapy with Dr. Marguerita Shih here with syncope. Multiple syncopal episodes over the past week, poor p.o. intake, minimal fluid intake.  Vomiting. Labs from yesterday at cancer Center do show increased BUN/creatinine from baseline.  Yesterday Cr was 1.32, baseline is around 0.9- 1.1   Clinical Course as of 02/02/24 1617  Thu Feb 02, 2024  1453 Creatinine(!): 1.86 Worsened from prior, appears dry on exam, continue IVF [SG]    Clinical Course User Index [SG] Teddi Favors, DO     Patient is feeling better.  Blood pressure continues to improve with IV fluids.  Creatinine is elevated from baseline.  BUN is elevated.  Concern for dehydration.  Likely prerenal AKI.  Likely source of his syncope and hypotension.  Recommend admission.  Patient family agreeable.  Notified oncology Dr. Liam Redhead.  Admit [facility].  Additional history obtained: -Additional history obtained from family -External records from outside source obtained and reviewed including: Chart review including previous notes, labs, imaging, consultation notes including  Prior labs Oncology documentation - dr Marguerita Shih    Lab Tests: -I ordered, reviewed, and interpreted labs.   The pertinent results include:   Labs Reviewed  CBC WITH DIFFERENTIAL/PLATELET - Abnormal; Notable for the following components:      Result Value   RBC 3.48 (*)    Hemoglobin 11.2 (*)    HCT 34.0 (*)    RDW 15.7 (*)    All other components  within normal limits  COMPREHENSIVE METABOLIC PANEL WITH GFR - Abnormal; Notable for the following components:   Chloride 97 (*)    Glucose, Bld 156 (*)    BUN 52 (*)    Creatinine, Ser 1.86 (*)    Calcium  8.7 (*)    Total Protein 5.6 (*)    Albumin  2.9 (*)    GFR, Estimated 38 (*)    All other components within normal limits  CBG MONITORING, ED - Abnormal; Notable for the following components:   Glucose-Capillary 158 (*)    All other components within normal limits  MAGNESIUM   TROPONIN I (HIGH SENSITIVITY)  TROPONIN I (HIGH SENSITIVITY)    Notable for aki  EKG   EKG Interpretation Date/Time:    Ventricular Rate:    PR Interval:    QRS Duration:    QT Interval:    QTC Calculation:   R Axis:      Text Interpretation:           Imaging Studies ordered: I ordered imaging studies including cxr I independently visualized the following imaging with scope of interpretation limited to determining acute life threatening conditions related to emergency care; findings noted above I agree with the radiologist interpretation If any imaging was obtained with contrast I closely monitored patient for any possible adverse reaction a/w contrast administration in the emergency department   Medicines ordered and prescription drug management: Meds ordered this encounter  Medications   sodium chloride  0.9 % bolus 1,000 mL   sodium chloride  0.9 % bolus 1,000 mL   0.9 %  sodium chloride  infusion    -I have reviewed the patients home medicines and have made adjustments as needed   Consultations Obtained: I requested consultation with the trh,  and discussed lab and imaging findings as well as pertinent plan   Cardiac Monitoring: The patient was maintained on a cardiac monitor.  I personally viewed and interpreted the cardiac monitored which showed an underlying rhythm of: nsr Continuous pulse oximetry interpreted by myself, 95% on RA.    Social Determinants of Health:  Diagnosis  or treatment significantly limited by social determinants of health: current smoker   Reevaluation: After the interventions noted above, I reevaluated the patient and found that they have improved  Co morbidities that complicate the patient evaluation  Past Medical History:  Diagnosis Date   Adenomatous colon polyp 05/2009; 11/2014   2010:Tubular adenoma, no high grade dysplasia.[name]  + Hyperplastic polyps. 2016: Tubular adenoma-rpt 5 yrs.   Cancer [facility])    TURP for prostate cancer   COPD (chronic obstructive pulmonary disease) (HCC)    Coronary artery disease    Difficult intubation    H/O chronic gastritis 05/2009   EGD: no h.pylori,dysplasia,or evidence of malignancy   Hyperlipidemia    lovaza from prior PMD-stopped due to cost   Hypertension    Obesity, Class I, BMI 30.0-34.9 (  see actual BMI) 04/23/2014   Tobacco dependence    Ventral hernia    small      Dispostion: Disposition decision including need for hospitalization was considered, and patient admitted to the hospital.    Final Clinical Impression(s) / ED Diagnoses Final diagnoses:  Syncope, unspecified syncope type  Dehydration        Teddi Favors, DO 02/02/24 1617

## 2024-02-02 NOTE — ED Notes (Signed)
Tubes sent to lab

## 2024-02-02 NOTE — ED Triage Notes (Addendum)
 Pt to er, pt states that he had a dose of chemo last week, states that he passed out three times over weekend and came here.  States that he was checked out.   States that today they came here to pick up some medications and pt had some loss of consciousness.    Pt is currently awake and oriented times three

## 2024-02-03 ENCOUNTER — Observation Stay (HOSPITAL_COMMUNITY)

## 2024-02-03 ENCOUNTER — Telehealth: Payer: Self-pay | Admitting: Internal Medicine

## 2024-02-03 DIAGNOSIS — Z79899 Other long term (current) drug therapy: Secondary | ICD-10-CM | POA: Diagnosis not present

## 2024-02-03 DIAGNOSIS — Z8249 Family history of ischemic heart disease and other diseases of the circulatory system: Secondary | ICD-10-CM | POA: Diagnosis not present

## 2024-02-03 DIAGNOSIS — R55 Syncope and collapse: Secondary | ICD-10-CM | POA: Diagnosis not present

## 2024-02-03 DIAGNOSIS — Z452 Encounter for adjustment and management of vascular access device: Secondary | ICD-10-CM | POA: Diagnosis not present

## 2024-02-03 DIAGNOSIS — Z9079 Acquired absence of other genital organ(s): Secondary | ICD-10-CM | POA: Diagnosis not present

## 2024-02-03 DIAGNOSIS — I959 Hypotension, unspecified: Secondary | ICD-10-CM | POA: Diagnosis not present

## 2024-02-03 DIAGNOSIS — I10 Essential (primary) hypertension: Secondary | ICD-10-CM | POA: Diagnosis not present

## 2024-02-03 DIAGNOSIS — E860 Dehydration: Secondary | ICD-10-CM | POA: Diagnosis not present

## 2024-02-03 DIAGNOSIS — Z8546 Personal history of malignant neoplasm of prostate: Secondary | ICD-10-CM | POA: Diagnosis not present

## 2024-02-03 DIAGNOSIS — K219 Gastro-esophageal reflux disease without esophagitis: Secondary | ICD-10-CM | POA: Diagnosis not present

## 2024-02-03 DIAGNOSIS — J449 Chronic obstructive pulmonary disease, unspecified: Secondary | ICD-10-CM | POA: Diagnosis not present

## 2024-02-03 DIAGNOSIS — Z9049 Acquired absence of other specified parts of digestive tract: Secondary | ICD-10-CM | POA: Diagnosis not present

## 2024-02-03 DIAGNOSIS — Z902 Acquired absence of lung [part of]: Secondary | ICD-10-CM | POA: Diagnosis not present

## 2024-02-03 DIAGNOSIS — Z825 Family history of asthma and other chronic lower respiratory diseases: Secondary | ICD-10-CM | POA: Diagnosis not present

## 2024-02-03 DIAGNOSIS — E66811 Obesity, class 1: Secondary | ICD-10-CM | POA: Diagnosis not present

## 2024-02-03 DIAGNOSIS — Z7951 Long term (current) use of inhaled steroids: Secondary | ICD-10-CM | POA: Diagnosis not present

## 2024-02-03 DIAGNOSIS — Z95828 Presence of other vascular implants and grafts: Secondary | ICD-10-CM | POA: Diagnosis not present

## 2024-02-03 DIAGNOSIS — Z860101 Personal history of adenomatous and serrated colon polyps: Secondary | ICD-10-CM | POA: Diagnosis not present

## 2024-02-03 DIAGNOSIS — E876 Hypokalemia: Secondary | ICD-10-CM | POA: Diagnosis not present

## 2024-02-03 DIAGNOSIS — F17210 Nicotine dependence, cigarettes, uncomplicated: Secondary | ICD-10-CM | POA: Diagnosis not present

## 2024-02-03 DIAGNOSIS — Z83438 Family history of other disorder of lipoprotein metabolism and other lipidemia: Secondary | ICD-10-CM | POA: Diagnosis not present

## 2024-02-03 DIAGNOSIS — E785 Hyperlipidemia, unspecified: Secondary | ICD-10-CM | POA: Diagnosis not present

## 2024-02-03 DIAGNOSIS — Z833 Family history of diabetes mellitus: Secondary | ICD-10-CM | POA: Diagnosis not present

## 2024-02-03 DIAGNOSIS — C3412 Malignant neoplasm of upper lobe, left bronchus or lung: Secondary | ICD-10-CM | POA: Diagnosis not present

## 2024-02-03 DIAGNOSIS — I2510 Atherosclerotic heart disease of native coronary artery without angina pectoris: Secondary | ICD-10-CM | POA: Diagnosis not present

## 2024-02-03 DIAGNOSIS — N179 Acute kidney failure, unspecified: Secondary | ICD-10-CM | POA: Diagnosis not present

## 2024-02-03 LAB — ECHOCARDIOGRAM COMPLETE
AR max vel: 3.13 cm2
AV Area VTI: 2.89 cm2
AV Area mean vel: 3.13 cm2
AV Mean grad: 4 mmHg
AV Peak grad: 9.2 mmHg
Ao pk vel: 1.52 m/s
Area-P 1/2: 2.99 cm2
Calc EF: 79.6 %
Height: 71 in
S' Lateral: 2.2 cm
Single Plane A2C EF: 80.1 %
Single Plane A4C EF: 77.9 %
Weight: 2880 [oz_av]

## 2024-02-03 LAB — CBC
HCT: 30.9 % — ABNORMAL LOW (ref 39.0–52.0)
Hemoglobin: 10.3 g/dL — ABNORMAL LOW (ref 13.0–17.0)
MCH: 32.3 pg (ref 26.0–34.0)
MCHC: 33.3 g/dL (ref 30.0–36.0)
MCV: 96.9 fL (ref 80.0–100.0)
Platelets: 122 10*3/uL — ABNORMAL LOW (ref 150–400)
RBC: 3.19 MIL/uL — ABNORMAL LOW (ref 4.22–5.81)
RDW: 15.8 % — ABNORMAL HIGH (ref 11.5–15.5)
WBC: 5.9 10*3/uL (ref 4.0–10.5)
nRBC: 0 % (ref 0.0–0.2)

## 2024-02-03 LAB — COMPREHENSIVE METABOLIC PANEL WITH GFR
ALT: 13 U/L (ref 0–44)
AST: 13 U/L — ABNORMAL LOW (ref 15–41)
Albumin: 2.5 g/dL — ABNORMAL LOW (ref 3.5–5.0)
Alkaline Phosphatase: 59 U/L (ref 38–126)
Anion gap: 11 (ref 5–15)
BUN: 48 mg/dL — ABNORMAL HIGH (ref 8–23)
CO2: 28 mmol/L (ref 22–32)
Calcium: 7.7 mg/dL — ABNORMAL LOW (ref 8.9–10.3)
Chloride: 99 mmol/L (ref 98–111)
Creatinine, Ser: 1.53 mg/dL — ABNORMAL HIGH (ref 0.61–1.24)
GFR, Estimated: 49 mL/min — ABNORMAL LOW (ref >60)
Glucose, Bld: 131 mg/dL — ABNORMAL HIGH (ref 70–99)
Potassium: 3.2 mmol/L — ABNORMAL LOW (ref 3.5–5.1)
Sodium: 138 mmol/L (ref 135–145)
Total Bilirubin: 0.8 mg/dL (ref 0.0–1.2)
Total Protein: 5.1 g/dL — ABNORMAL LOW (ref 6.5–8.1)

## 2024-02-03 LAB — HIV ANTIBODY (ROUTINE TESTING W REFLEX): HIV Screen 4th Generation wRfx: NONREACTIVE

## 2024-02-03 MED ORDER — PERFLUTREN LIPID MICROSPHERE
1.0000 mL | INTRAVENOUS | Status: AC | PRN
  Administered 2024-02-03: 2 mL via INTRAVENOUS

## 2024-02-03 MED ORDER — HYDRALAZINE HCL 25 MG PO TABS: 25.0000 mg | ORAL_TABLET | Freq: Four times a day (QID) | ORAL | Status: DC | PRN

## 2024-02-03 MED ORDER — METOCLOPRAMIDE HCL 10 MG PO TABS
5.0000 mg | ORAL_TABLET | Freq: Three times a day (TID) | ORAL | Status: AC
  Administered 2024-02-03 – 2024-02-05 (×6): 5 mg via ORAL
  Filled 2024-02-03 (×6): qty 1

## 2024-02-03 MED ORDER — POTASSIUM CHLORIDE 20 MEQ PO PACK
40.0000 meq | PACK | Freq: Once | ORAL | Status: AC
  Administered 2024-02-03: 40 meq via ORAL
  Filled 2024-02-03: qty 2

## 2024-02-03 MED ORDER — SODIUM CHLORIDE 0.9 % IV SOLN: INTRAVENOUS | Status: AC

## 2024-02-03 MED ORDER — PANTOPRAZOLE SODIUM 40 MG PO TBEC
40.0000 mg | DELAYED_RELEASE_TABLET | Freq: Every day | ORAL | Status: DC
  Administered 2024-02-03 – 2024-02-05 (×3): 40 mg via ORAL
  Filled 2024-02-03 (×3): qty 1

## 2024-02-03 NOTE — Telephone Encounter (Signed)
 [name] a voicemail to call me back to reschedule the rest of Marks port flush w/ lab appointments per a conversation had with her in person by a colleague. I informed Cameron [name] that I went ahead and moved his 5/21 appointment to 10:45 instead of 2:45.

## 2024-02-03 NOTE — Progress Notes (Signed)
*  PRELIMINARY RESULTS* Echocardiogram 2D Echocardiogram has been performed.  Cameron [name] More 02/03/2024, 3:59 PM

## 2024-02-03 NOTE — Plan of Care (Addendum)
 VSS. No c/o pain. Patient c/o nausea, given PRN Zofran  and one time dose of Phenergan . Patient ambulating to bathroom with standby assist. NS infusing at 100ml/hr. No acute events overnight.  Problem: Education: Goal: Knowledge of General Education information will improve Description: Including pain rating scale, medication(s)/side effects and non-pharmacologic comfort measures Outcome: Progressing   Problem: Clinical Measurements: Goal: Ability to maintain clinical measurements within normal limits will improve Outcome: Progressing   Problem: Activity: Goal: Risk for activity intolerance will decrease Outcome: Progressing   Problem: Safety: Goal: Ability to remain free from injury will improve Outcome: Progressing   Problem: Education: Goal: Knowledge of condition and prescribed therapy will improve Outcome: Progressing   Problem: Cardiac: Goal: Will achieve and/or maintain adequate cardiac output Outcome: Progressing   Problem: Physical Regulation: Goal: Complications related to the disease process, condition or treatment will be avoided or minimized Outcome: Progressing

## 2024-02-03 NOTE — Progress Notes (Signed)
 [facility]  PROGRESS NOTE  Cameron [name] ZOX:[id number] DOB: Dec 23, 1950 DOA: 02/02/2024 PCP: Dorothe Gaster, NP   Brief HPI:    73 y.o. male, with history of severe stage IIA squamous cell carcinoma diagnosed in January 2025 status post left upper lobectomy and lymph node dissection on October 03, 2023, undergoing chemotherapy.  Last chemo was a week ago.  Patient says that since last chemo he has had very poor p.o. intake and had 2 episodes of syncope.  First episode was on Saturday when he was in shower.  Today he had second episode of syncope while he was waiting at outpatient pharmacy to pick up eyedrops prescribed by patient's wife's retina specialist. Patient says that he does not remember events after he passed out in pharmacy.  He remembers waking up in the ED. He was seen in the ED on 5/3 after syncopal episode in the shower at that time he was given IV fluids and discharged home.  Patient says that he has had very poor p.o. intake since that time.    Assessment/Plan:   Syncope-likely in setting of dehydration, acute kidney injury.  Patient given IV fluid bolus in the ED. Started on  IV normal saline at 100 mL/h.  Orthostatic vital signs were positive this morning.  Will order TED hose.  Continue to check orthostatic vital signs every 4 hours x 3.  Will monitor closely on telemetry.  Will obtain echocardiogram  Acute kidney injury-likely in setting of poor p.o. intake/dehydration.  Patient says that he has had poor p.o. intake since last chemo a week ago.  Started on IV fluids as above.  BUN/creatinine has improved.   Stage II non-small cell lung cancer-patient getting chemotherapy as outpatient per Dr. Marguerita Shih.   Hypertension-patient takes losartan, metoprolol  and HCTZ at home.  Will hold these medications at this time due to acute kidney injury/dehydration as above.  COPD-will continue Advair   Hypokalemia-replace potassium and follow BMP in am  GERD-complains of acid reflux  symptoms with nausea.  Will start Protonix  40 g daily and Reglan 5 mg p.o. 3 times daily with meals.      Medications     Chlorhexidine  Gluconate Cloth  6 each Topical Daily   dorzolamide   1 drop Right Eye BID   enoxaparin  (LOVENOX ) injection  40 mg Subcutaneous QHS   fluticasone  furoate-vilanterol  1 puff Inhalation Daily   gabapentin   300 mg Oral BID   potassium chloride   40 mEq Oral Once   sodium chloride  flush  10-40 mL Intracatheter Q12H   sodium chloride  flush  10-40 mL Intracatheter Q12H     Data Reviewed:   CBG:  Recent Labs  Lab 01/28/24 0921 02/02/24 1350  GLUCAP 121* 158*    SpO2: 96 %    Vitals:   02/02/24 1758 02/02/24 2206 02/03/24 0200 02/03/24 0502  BP: (!) 146/122 113/75 114/75 (!) 158/77  Pulse: (!) 101 (!) 104 90 100  Resp: 18 18  20   Temp: 98.5 F (36.9 C) 98.3 F (36.8 C) 98.2 F (36.8 C) 98.4 F (36.9 C)  TempSrc: Oral Oral Oral Oral  SpO2: 99% 97% 97% 96%  Weight:      Height:          Data Reviewed:  Basic Metabolic Panel: Recent Labs  Lab 01/28/24 1007 02/01/24 1452 02/02/24 1353 02/03/24 0316  NA 132* 138 135 138  K 3.5 3.5 3.9 3.2*  CL 98 98 97* 99  CO2 25 31 27  28  GLUCOSE 132* 133* 156* 131*  BUN 31* 46* 52* 48*  CREATININE 1.08 1.32* 1.86* 1.53*  CALCIUM  8.1* 8.5* 8.7* 7.7*  MG  --  1.9 1.9  --     CBC: Recent Labs  Lab 01/28/24 1007 02/01/24 1452 02/02/24 1353 02/03/24 0316  WBC 34.0* 2.6* 5.2 5.9  NEUTROABS 32.3* 1.1* 2.4  --   HGB 10.8* 11.5* 11.2* 10.3*  HCT 32.5* 32.7* 34.0* 30.9*  MCV 96.4 92.6 97.7 96.9  PLT 205 155 152 122*    LFT Recent Labs  Lab 01/28/24 1007 02/01/24 1452 02/02/24 1353 02/03/24 0316  AST 23 11* 16 13*  ALT 19 12 14 13   ALKPHOS 63 70 67 59  BILITOT 0.7 0.8 0.9 0.8  PROT 5.2* 5.8* 5.6* 5.1*  ALBUMIN  2.9* 3.4* 2.9* 2.5*     Antibiotics: Anti-infectives (From admission, onward)    None        DVT prophylaxis: Lovenox   Code Status: Full code  Family  Communication: Discussed with patient's wife at bedside   CONSULTS    Subjective   Complains of symptoms of acid reflux   Objective    Physical Examination:   General-appears in no acute distress Heart-S1-S2, regular, no murmur auscultated Lungs-clear to auscultation bilaterally, no wheezing or crackles auscultated Abdomen-soft, nontender, no organomegaly Extremities-no edema in the lower extremities Neuro-alert, oriented x3, no focal deficit noted   Status is: Inpatient:             Cameron [name]   [facility] If 7PM-7AM, please contact night-coverage at [email or web address], Office  [phone]   02/03/2024, 8:59 AM  LOS: 0 days

## 2024-02-03 NOTE — Progress Notes (Signed)
   02/03/24 1238  TOC Brief Assessment  Insurance and Status Reviewed  Patient has primary care physician Yes  Home environment has been reviewed single family home  Prior level of function: independent  Prior/Current Home Services No current home services  Social Drivers of Health Review SDOH reviewed no interventions necessary  Readmission risk has been reviewed Yes  Transition of care needs no transition of care needs at this time    [place], MSW, LCSW 02/03/2024 12:39 PM

## 2024-02-03 NOTE — Telephone Encounter (Signed)
 Confirmed and rescheduled appointments per the patients request. Spoke to the patients wife and they are aware of the changes made.

## 2024-02-03 NOTE — Plan of Care (Signed)

## 2024-02-04 DIAGNOSIS — R55 Syncope and collapse: Secondary | ICD-10-CM | POA: Diagnosis not present

## 2024-02-04 DIAGNOSIS — E860 Dehydration: Secondary | ICD-10-CM | POA: Diagnosis not present

## 2024-02-04 DIAGNOSIS — N179 Acute kidney failure, unspecified: Secondary | ICD-10-CM | POA: Diagnosis not present

## 2024-02-04 LAB — COMPREHENSIVE METABOLIC PANEL WITH GFR
ALT: 12 U/L (ref 0–44)
AST: 14 U/L — ABNORMAL LOW (ref 15–41)
Albumin: 2.2 g/dL — ABNORMAL LOW (ref 3.5–5.0)
Alkaline Phosphatase: 57 U/L (ref 38–126)
Anion gap: 7 (ref 5–15)
BUN: 34 mg/dL — ABNORMAL HIGH (ref 8–23)
CO2: 26 mmol/L (ref 22–32)
Calcium: 7.4 mg/dL — ABNORMAL LOW (ref 8.9–10.3)
Chloride: 104 mmol/L (ref 98–111)
Creatinine, Ser: 1.19 mg/dL (ref 0.61–1.24)
GFR, Estimated: >60 mL/min (ref >60)
Glucose, Bld: 127 mg/dL — ABNORMAL HIGH (ref 70–99)
Potassium: 3 mmol/L — ABNORMAL LOW (ref 3.5–5.1)
Sodium: 137 mmol/L (ref 135–145)
Total Bilirubin: 0.3 mg/dL (ref 0.0–1.2)
Total Protein: 4.3 g/dL — ABNORMAL LOW (ref 6.5–8.1)

## 2024-02-04 LAB — CBC
HCT: 28.9 % — ABNORMAL LOW (ref 39.0–52.0)
Hemoglobin: 9.3 g/dL — ABNORMAL LOW (ref 13.0–17.0)
MCH: 32.3 pg (ref 26.0–34.0)
MCHC: 32.2 g/dL (ref 30.0–36.0)
MCV: 100.3 fL — ABNORMAL HIGH (ref 80.0–100.0)
Platelets: 136 10*3/uL — ABNORMAL LOW (ref 150–400)
RBC: 2.88 MIL/uL — ABNORMAL LOW (ref 4.22–5.81)
RDW: 16 % — ABNORMAL HIGH (ref 11.5–15.5)
WBC: 13.5 10*3/uL — ABNORMAL HIGH (ref 4.0–10.5)
nRBC: 0 % (ref 0.0–0.2)

## 2024-02-04 MED ORDER — POTASSIUM CHLORIDE 10 MEQ/100ML IV SOLN
10.0000 meq | INTRAVENOUS | Status: AC
  Administered 2024-02-04 (×3): 10 meq via INTRAVENOUS
  Filled 2024-02-04 (×3): qty 100

## 2024-02-04 NOTE — Plan of Care (Signed)
 VSS. No c/o pain. LBM 5/9. No acute events overnight.  Problem: Education: Goal: Knowledge of General Education information will improve Description: Including pain rating scale, medication(s)/side effects and non-pharmacologic comfort measures Outcome: Progressing   Problem: Health Behavior/Discharge Planning: Goal: Ability to manage health-related needs will improve Outcome: Progressing   Problem: Clinical Measurements: Goal: Ability to maintain clinical measurements within normal limits will improve Outcome: Progressing Goal: Cardiovascular complication will be avoided Outcome: Progressing   Problem: Activity: Goal: Risk for activity intolerance will decrease Outcome: Progressing   Problem: Safety: Goal: Ability to remain free from injury will improve Outcome: Progressing   Problem: Education: Goal: Knowledge of condition and prescribed therapy will improve Outcome: Progressing   Problem: Cardiac: Goal: Will achieve and/or maintain adequate cardiac output Outcome: Progressing   Problem: Physical Regulation: Goal: Complications related to the disease process, condition or treatment will be avoided or minimized Outcome: Progressing

## 2024-02-04 NOTE — Plan of Care (Signed)
  Problem: Nutrition: Goal: Adequate nutrition will be maintained Outcome: Progressing   Problem: Coping: Goal: Level of anxiety will decrease Outcome: Progressing   Problem: Elimination: Goal: Will not experience complications related to bowel motility Outcome: Progressing   Problem: Pain Managment: Goal: General experience of comfort will improve and/or be controlled Outcome: Progressing   Problem: Safety: Goal: Ability to remain free from injury will improve Outcome: Progressing   Problem: Skin Integrity: Goal: Risk for impaired skin integrity will decrease Outcome: Progressing

## 2024-02-04 NOTE — Evaluation (Signed)
 Physical Therapy Evaluation Patient Details Name: Cameron [name] MRN: [id number] DOB: [birth year] Today's Date: 02/04/2024  History of Present Illness  Pt is a 72yomale presenting with 2-episodes of syncope after chemo 1week ago. On 5/3 he was seen in the ED for syncopal episode and dc home. CXR negative.  PMH: history of severe stage IIA squamous cell carcinoma diagnosed in January 2025 status post left upper lobectomy and lymph node dissection on October 03, 2023, undergoing chemotherapy; hx of prostate cancer s/p TURP, COPD, CAD, HLD, HTN.   Clinical Impression  Patient evaluated by Physical Therapy with no further acute PT needs identified. All education has been completed and the patient has no further questions.  Pt's orthostatic vitals taken, were positive between lying to sitting, with marching and BUE punches pt recovered to 116/71; asymptomatic, elected to continue to standing; see below for vitals results. Pt completed bed mobility and transfers with SUP and ambulated in hallway with CGA wtihout device, completed stair training. Pt's balanced assessed via dynamic Gait Index and scored 19/24 indicating borderline for at risk of falls, recommending pt utilize rollator for increased safety with seat should pt experience symptoms of pre-syncope, pt verbalized understanding.  See below for any follow-up Physical Therapy or equipment needs. PT is signing off. Thank you for this referral.              02/04/24 1700  Orthostatic Lying   BP- Lying 120/79  Pulse- Lying 81  Orthostatic Sitting  BP- Sitting 96/80  Pulse- Sitting 88  Orthostatic Standing at 0 minutes  BP- Standing at 0 minutes 113/72  Pulse- Standing at 0 minutes 99  Orthostatic Standing at 3 minutes  BP- Standing at 3 minutes 128/65  Pulse- Standing at 3 minutes 101  Oxygen Therapy  SpO2 100 %  O2 Device Room Air               If plan is discharge home, recommend the following: A little help with walking and/or  transfers   Can travel by private vehicle        Equipment Recommendations None recommended by PT (pt has recommended DME)  Recommendations for Other Services       Functional Status Assessment Patient has had a recent decline in their functional status and demonstrates the ability to make significant improvements in function in a reasonable and predictable amount of time.     Precautions / Restrictions Precautions Precautions: None Restrictions Weight Bearing Restrictions Per Provider Order: No      Mobility  Bed Mobility Overal bed mobility: Needs Assistance Bed Mobility: Supine to Sit     Supine to sit: Supervision     General bed mobility comments: Increased time.    Transfers Overall transfer level: Needs assistance Equipment used: Rolling walker (2 wheels) Transfers: Sit to/from Stand Sit to Stand: Supervision           General transfer comment: Increased time with supervision secondary to hx of syncope    Ambulation/Gait Ambulation/Gait assistance: Supervision Gait Distance (Feet): 200 Feet Assistive device: None Gait Pattern/deviations: Step-through pattern, Drifts right/left Gait velocity: decre     General Gait Details: Pt ambulated without AD for 244ft, no overt LOB noted nor physical assist required, occasional ML sway with one instance of crossover gait able to self-correct without assistance.  Stairs Stairs: Yes Stairs assistance: Contact guard assist Stair Management: One rail Right, Alternating pattern, Forwards Number of Stairs: 3 General stair comments: Pt completed stair training with CGA, no  overt LOB noted nor physical assist req  Wheelchair Mobility     Tilt Bed    Modified Rankin (Stroke Patients Only)       Balance Overall balance assessment: Needs assistance Sitting-balance support: Feet supported, No upper extremity supported Sitting balance-Leahy Scale: Good     Standing balance support: No upper extremity  supported, During functional activity Standing balance-Leahy Scale: Good                   Standardized Balance Assessment Standardized Balance Assessment : Dynamic Gait Index   Dynamic Gait Index Level Surface: Normal Change in Gait Speed: Normal Gait with Horizontal Head Turns: Mild Impairment Gait with Vertical Head Turns: Normal Gait and Pivot Turn: Moderate Impairment Step Over Obstacle: Normal Step Around Obstacles: Mild Impairment Steps: Mild Impairment Total Score: 19       Pertinent Vitals/Pain      Home Living Family/patient expects to be discharged to:: Private residence Living Arrangements: Spouse/significant other Available Help at Discharge: Family;Available 24 hours/day Type of Home: House Home Access: Stairs to enter Entrance Stairs-Rails: Can reach both Entrance Stairs-Number of Steps: 3   Home Layout: One level;Laundry or work area in [employer] Equipment: Tub bench      Prior Function Prior Level of Function : Independent/Modified Independent             Mobility Comments: IND ADLs Comments: SUP for showers since recent syncope     Extremity/Trunk Assessment   Upper Extremity Assessment Upper Extremity Assessment: Overall WFL for tasks assessed    Lower Extremity Assessment Lower Extremity Assessment: Overall WFL for tasks assessed    Cervical / Trunk Assessment Cervical / Trunk Assessment: Normal  Communication   Communication Communication: No apparent difficulties    Cognition Arousal: Alert Behavior During Therapy: WFL for tasks assessed/performed   PT - Cognitive impairments: No apparent impairments                         Following commands: Intact       Cueing Cueing Techniques: Verbal cues     General Comments      Exercises     Assessment/Plan    PT Assessment All further PT needs can be met in the next venue of care  PT Problem List Decreased balance;Decreased knowledge of use of  DME       PT Treatment Interventions      PT Goals (Current goals can be found in the Care Plan section)  Acute Rehab PT Goals Patient Stated Goal: to go home PT Goal Formulation: With patient Time For Goal Achievement: 02/18/24 Potential to Achieve Goals: Good    Frequency       Co-evaluation               AM-PAC PT "6 Clicks" Mobility  Outcome Measure Help needed turning from your back to your side while in a flat bed without using bedrails?: A Little Help needed moving from lying on your back to sitting on the side of a flat bed without using bedrails?: A Little Help needed moving to and from a bed to a chair (including a wheelchair)?: A Little Help needed standing up from a chair using your arms (e.g., wheelchair or bedside chair)?: A Little Help needed to walk in hospital room?: A Little Help needed climbing 3-5 steps with a railing? : A Little 6 Click Score: 18    End of Session Equipment Utilized During  Treatment: Gait belt Activity Tolerance: Patient tolerated treatment well;No increased pain Patient left: in chair;with call bell/phone within reach;with chair alarm set Nurse Communication: Mobility status PT Visit Diagnosis: Repeated falls (R29.6)    Time: 4098-1191 PT Time Calculation (min) (ACUTE ONLY): 47 min   Charges:   PT Evaluation $PT Eval Low Complexity: 1 Low PT Treatments $Gait Training: 8-22 mins $Therapeutic Activity: 8-22 mins PT General Charges $$ ACUTE PT VISIT: 1 Visit       Jerrye Mori, PT, DPT [facility] Office: [phone]  Jerrye Mori 02/04/2024, 5:14 PM

## 2024-02-04 NOTE — Progress Notes (Addendum)
 [facility]  PROGRESS NOTE  Cameron [name] OZD:[id number] DOB: 11/11/1950 DOA: 02/02/2024 PCP: Dorothe Gaster, NP   Brief HPI:    73 y.o. male, with history of severe stage IIA squamous cell carcinoma diagnosed in January 2025 status post left upper lobectomy and lymph node dissection on October 03, 2023, undergoing chemotherapy.  Last chemo was a week ago.  Patient says that since last chemo he has had very poor p.o. intake and had 2 episodes of syncope.  First episode was on Saturday when he was in shower.  Today he had second episode of syncope while he was waiting at outpatient pharmacy to pick up eyedrops prescribed by patient's wife's retina specialist. Patient says that he does not remember events after he passed out in pharmacy.  He remembers waking up in the ED. He was seen in the ED on 5/3 after syncopal episode in the shower at that time he was given IV fluids and discharged home.  Patient says that he has had very poor p.o. intake since that time.    Assessment/Plan:   Syncope-likely in setting of dehydration, acute kidney injury.  Improved after getting IV fluids.  Orthostatic vital signs were positive, TED hose ordered.  Echocardiogram showed EF 65 to 70%, grade 1 diastolic dysfunction.  Acute kidney injury-likely in setting of poor p.o. intake/dehydration.  Patient says that he has had poor p.o. intake since last chemo a week ago.  Started on IV fluids as above.  BUN/creatinine has improved to 34/1.19  Stage II non-small cell lung cancer-patient getting chemotherapy as outpatient per Dr. Marguerita Shih.   Hypertension-patient takes losartan, metoprolol  and HCTZ at home.  Will hold these medications at this time due to acute kidney injury/dehydration as above.  COPD-will continue Advair   Hypokalemia-replace potassium and follow BMP in am  GERD-complains of acid reflux symptoms with nausea.  Improved after starting Protonix  40 mg daily and Reglan 5 mg p.o. 3 times daily with meals.       Medications     Chlorhexidine  Gluconate Cloth  6 each Topical Daily   dorzolamide   1 drop Right Eye BID   enoxaparin  (LOVENOX ) injection  40 mg Subcutaneous QHS   fluticasone  furoate-vilanterol  1 puff Inhalation Daily   gabapentin   300 mg Oral BID   metoCLOPramide  5 mg Oral TID AC   pantoprazole   40 mg Oral Q1200   sodium chloride  flush  10-40 mL Intracatheter Q12H   sodium chloride  flush  10-40 mL Intracatheter Q12H     Data Reviewed:   CBG:  Recent Labs  Lab 02/02/24 1350  GLUCAP 158*    SpO2: 99 %    Vitals:   02/03/24 0502 02/03/24 1217 02/03/24 2228 02/04/24 0420  BP: (!) 158/77 131/76 136/74 122/65  Pulse: 100 92 92 87  Resp: 20  18 17   Temp: 98.4 F (36.9 C) 97.8 F (36.6 C) 98 F (36.7 C) (!) 97.4 F (36.3 C)  TempSrc: Oral Oral Oral Oral  SpO2: 96% 100% 100% 99%  Weight:      Height:          Data Reviewed:  Basic Metabolic Panel: Recent Labs  Lab 01/28/24 1007 02/01/24 1452 02/02/24 1353 02/03/24 0316 02/04/24 0416  NA 132* 138 135 138 137  K 3.5 3.5 3.9 3.2* 3.0*  CL 98 98 97* 99 104  CO2 25 31 27 28 26   GLUCOSE 132* 133* 156* 131* 127*  BUN 31* 46* 52* 48* 34*  CREATININE 1.08  1.32* 1.86* 1.53* 1.19  CALCIUM  8.1* 8.5* 8.7* 7.7* 7.4*  MG  --  1.9 1.9  --   --     CBC: Recent Labs  Lab 01/28/24 1007 02/01/24 1452 02/02/24 1353 02/03/24 0316 02/04/24 0416  WBC 34.0* 2.6* 5.2 5.9 13.5*  NEUTROABS 32.3* 1.1* 2.4  --   --   HGB 10.8* 11.5* 11.2* 10.3* 9.3*  HCT 32.5* 32.7* 34.0* 30.9* 28.9*  MCV 96.4 92.6 97.7 96.9 100.3*  PLT 205 155 152 122* 136*    LFT Recent Labs  Lab 01/28/24 1007 02/01/24 1452 02/02/24 1353 02/03/24 0316 02/04/24 0416  AST 23 11* 16 13* 14*  ALT 19 12 14 13 12   ALKPHOS 63 70 67 59 57  BILITOT 0.7 0.8 0.9 0.8 0.3  PROT 5.2* 5.8* 5.6* 5.1* 4.3*  ALBUMIN  2.9* 3.4* 2.9* 2.5* 2.2*     Antibiotics: Anti-infectives (From admission, onward)    None        DVT prophylaxis:  Lovenox   Code Status: Full code  Family Communication: Discussed with patient's wife at bedside   CONSULTS    Subjective   Feels better this morning.  Denies hiccups or GERD symptoms.   Objective    Physical Examination:   General-appears in no acute distress Heart-S1-S2, regular, no murmur auscultated Lungs-clear to auscultation bilaterally, no wheezing or crackles auscultated Abdomen-soft, nontender, no organomegaly Extremities-no edema in the lower extremities Neuro-alert, oriented x3, no focal deficit noted   Status is: Inpatient:             Cameron [name]   [facility] If 7PM-7AM, please contact night-coverage at [email or web address], Office  [phone]   02/04/2024, 9:49 AM  LOS: 1 day

## 2024-02-05 DIAGNOSIS — N179 Acute kidney failure, unspecified: Secondary | ICD-10-CM | POA: Diagnosis not present

## 2024-02-05 DIAGNOSIS — R55 Syncope and collapse: Secondary | ICD-10-CM | POA: Diagnosis not present

## 2024-02-05 LAB — BASIC METABOLIC PANEL WITH GFR
Anion gap: 7 (ref 5–15)
BUN: 24 mg/dL — ABNORMAL HIGH (ref 8–23)
CO2: 26 mmol/L (ref 22–32)
Calcium: 7.8 mg/dL — ABNORMAL LOW (ref 8.9–10.3)
Chloride: 106 mmol/L (ref 98–111)
Creatinine, Ser: 1.12 mg/dL (ref 0.61–1.24)
GFR, Estimated: >60 mL/min (ref >60)
Glucose, Bld: 106 mg/dL — ABNORMAL HIGH (ref 70–99)
Potassium: 3 mmol/L — ABNORMAL LOW (ref 3.5–5.1)
Sodium: 139 mmol/L (ref 135–145)

## 2024-02-05 LAB — MAGNESIUM: Magnesium: 1.6 mg/dL — ABNORMAL LOW (ref 1.7–2.4)

## 2024-02-05 LAB — POTASSIUM: Potassium: 3.7 mmol/L (ref 3.5–5.1)

## 2024-02-05 MED ORDER — MAGNESIUM SULFATE IN D5W 1-5 GM/100ML-% IV SOLN
1.0000 g | Freq: Once | INTRAVENOUS | Status: AC
  Administered 2024-02-05: 1 g via INTRAVENOUS
  Filled 2024-02-05: qty 100

## 2024-02-05 MED ORDER — ORAL CARE MOUTH RINSE: 15.0000 mL | OROMUCOSAL | Status: DC | PRN

## 2024-02-05 MED ORDER — METOCLOPRAMIDE HCL 5 MG PO TABS: 5.0000 mg | ORAL_TABLET | Freq: Three times a day (TID) | ORAL | 0 refills | Status: DC | PRN

## 2024-02-05 MED ORDER — HEPARIN SOD (PORK) LOCK FLUSH 100 UNIT/ML IV SOLN
500.0000 [IU] | INTRAVENOUS | Status: AC | PRN
  Administered 2024-02-05: 500 [IU]
  Filled 2024-02-05: qty 5

## 2024-02-05 MED ORDER — POTASSIUM CHLORIDE 20 MEQ PO PACK
60.0000 meq | PACK | Freq: Once | ORAL | Status: AC
Start: 2024-02-05 — End: 2024-02-05
  Administered 2024-02-05: 60 meq via ORAL
  Filled 2024-02-05: qty 3

## 2024-02-05 MED ORDER — PANTOPRAZOLE SODIUM 40 MG PO TBEC: 40.0000 mg | DELAYED_RELEASE_TABLET | Freq: Every day | ORAL | 1 refills | Status: DC

## 2024-02-05 NOTE — Progress Notes (Incomplete)
 [facility]  PROGRESS NOTE  Cameron [name] NFA:[id number] DOB: 03/21/1951 DOA: 02/02/2024 PCP: Dorothe Gaster, NP   Brief HPI:    73 y.o. male, with history of severe stage IIA squamous cell carcinoma diagnosed in January 2025 status post left upper lobectomy and lymph node dissection on October 03, 2023, undergoing chemotherapy.  Last chemo was a week ago.  Patient says that since last chemo he has had very poor p.o. intake and had 2 episodes of syncope.  First episode was on Saturday when he was in shower.  Today he had second episode of syncope while he was waiting at outpatient pharmacy to pick up eyedrops prescribed by patient's wife's retina specialist. Patient says that he does not remember events after he passed out in pharmacy.  He remembers waking up in the ED. He was seen in the ED on 5/3 after syncopal episode in the shower at that time he was given IV fluids and discharged home.  Patient says that he has had very poor p.o. intake since that time.    Assessment/Plan:   Syncope-likely in setting of dehydration, acute kidney injury.  Improved after getting IV fluids.  Orthostatic vital signs were positive, TED hose ordered.  Echocardiogram showed EF 65 to 70%, grade 1 diastolic dysfunction.  Acute kidney injury-likely in setting of poor p.o. intake/dehydration.  Patient says that he has had poor p.o. intake since last chemo a week ago.  Started on IV fluids as above.  BUN/creatinine has improved to 34/1.19  Stage II non-small cell lung cancer-patient getting chemotherapy as outpatient per Dr. Marguerita Shih.   Hypertension-patient takes losartan, metoprolol  and HCTZ at home.  Will hold these medications at this time due to acute kidney injury/dehydration as above.  COPD-will continue Advair   Hypokalemia-replace potassium and follow BMP in am  GERD-complains of acid reflux symptoms with nausea.  Improved after starting Protonix  40 mg daily and Reglan 5 mg p.o. 3 times daily with meals.       Medications     Chlorhexidine  Gluconate Cloth  6 each Topical Daily   dorzolamide   1 drop Right Eye BID   enoxaparin  (LOVENOX ) injection  40 mg Subcutaneous QHS   fluticasone  furoate-vilanterol  1 puff Inhalation Daily   gabapentin   300 mg Oral BID   metoCLOPramide  5 mg Oral TID AC   pantoprazole   40 mg Oral Q1200   potassium chloride   60 mEq Oral Once   sodium chloride  flush  10-40 mL Intracatheter Q12H   sodium chloride  flush  10-40 mL Intracatheter Q12H     Data Reviewed:   CBG:  Recent Labs  Lab 02/02/24 1350  GLUCAP 158*    SpO2: 99 %    Vitals:   02/04/24 2042 02/04/24 2157 02/04/24 2204 02/05/24 0442  BP: 131/70 123/73 122/74 123/80  Pulse: 83 87 92 80  Resp: 18 18 18 18   Temp: 98 F (36.7 C) 97.7 F (36.5 C) 97.8 F (36.6 C) 98.2 F (36.8 C)  TempSrc: Oral Oral Oral Oral  SpO2: 100% 100% 100% 99%  Weight:      Height:          Data Reviewed:  Basic Metabolic Panel: Recent Labs  Lab 02/01/24 1452 02/02/24 1353 02/03/24 0316 02/04/24 0416 02/05/24 0304  NA 138 135 138 137 139  K 3.5 3.9 3.2* 3.0* 3.0*  CL 98 97* 99 104 106  CO2 31 27 28 26 26   GLUCOSE 133* 156* 131* 127* 106*  BUN 46*  52* 48* 34* 24*  CREATININE 1.32* 1.86* 1.53* 1.19 1.12  CALCIUM  8.5* 8.7* 7.7* 7.4* 7.8*  MG 1.9 1.9  --   --   --     CBC: Recent Labs  Lab 02/01/24 1452 02/02/24 1353 02/03/24 0316 02/04/24 0416  WBC 2.6* 5.2 5.9 13.5*  NEUTROABS 1.1* 2.4  --   --   HGB 11.5* 11.2* 10.3* 9.3*  HCT 32.7* 34.0* 30.9* 28.9*  MCV 92.6 97.7 96.9 100.3*  PLT 155 152 122* 136*    LFT Recent Labs  Lab 02/01/24 1452 02/02/24 1353 02/03/24 0316 02/04/24 0416  AST 11* 16 13* 14*  ALT 12 14 13 12   ALKPHOS 70 67 59 57  BILITOT 0.8 0.9 0.8 0.3  PROT 5.8* 5.6* 5.1* 4.3*  ALBUMIN  3.4* 2.9* 2.5* 2.2*     Antibiotics: Anti-infectives (From admission, onward)    None        DVT prophylaxis: Lovenox   Code Status: Full code  Family Communication:  Discussed with patient's wife at bedside   CONSULTS    Subjective      Objective    Physical Examination:      Status is: Inpatient:             Cameron [name]   [facility] If 7PM-7AM, please contact night-coverage at [email or web address], Office  [phone]   02/05/2024, 8:13 AM  LOS: 2 days

## 2024-02-05 NOTE — Discharge Summary (Signed)
 Physician Discharge Summary   Patient: Cameron [name] MRN: [id number] DOB: 02-21-1951  Admit date:     02/02/2024  Discharge date: 02/05/24  Discharge Physician: Ozell Blunt   PCP: Dorothe Gaster, NP   Recommendations at discharge:   Follow-up PCP in 1 week  Discharge Diagnoses: Principal Problem:   Syncope Active Problems:   AKI (acute kidney injury) (HCC)  Resolved Problems:   * No resolved hospital problems. [facility] Course:  73 y.o. male, with history of severe stage IIA squamous cell carcinoma diagnosed in January 2025 status post left upper lobectomy and lymph node dissection on October 03, 2023, undergoing chemotherapy.  Last chemo was a week ago.  Patient says that since last chemo he has had very poor p.o. intake and had 2 episodes of syncope.  First episode was on Saturday when he was in shower.  Today he had second episode of syncope while he was waiting at outpatient pharmacy to pick up eyedrops prescribed by patient's wife's retina specialist. Patient says that he does not remember events after he passed out in pharmacy.  He remembers waking up in the ED. He was seen in the ED on 5/3 after syncopal episode in the shower at that time he was given IV fluids and discharged home.  Patient says that he has had very poor p.o. intake since that time.    Assessment and Plan:  Syncope-likely in setting of dehydration, acute kidney injury.  Improved after getting IV fluids.  Orthostatic vital signs were positive, TED hose ordered.  Echocardiogram showed EF 65 to 70%, grade 1 diastolic dysfunction.  Repeat orthostatic vital signs are negative.  PT OT evaluation obtained, no PT follow-up recommended.  Patient to go home with rolling walker.   Acute kidney injury-likely in setting of poor p.o. intake/dehydration.  Patient says that he has had poor p.o. intake since last chemo a week ago.  Started on IV fluids as above.  Acute kidney injury has resolved.   Stage II non-small cell lung  cancer-patient getting chemotherapy as outpatient per Dr. Marguerita Shih.    Hypertension-patient takes losartan, metoprolol  and HCTZ at home.  Will hold these medications at this time due to acute kidney injury/dehydration as above.  Will discontinue losartan, metoprolol  and HCTZ at this time.  Blood pressure has been stable   COPD-will continue Advair    Hypokalemia-replace potassium and follow BMP in am   GERD-complains of acid reflux symptoms with nausea.  Improved after starting Protonix  40 mg daily and Reglan 5 mg p.o. 3 times daily with meals as needed.       Consultants:  Procedures performed:  Disposition: Home Diet recommendation:  Discharge Diet Orders (From admission, onward)     Start     Ordered   02/05/24 0000  Diet - low sodium heart healthy        02/05/24 1352           Regular diet DISCHARGE MEDICATION: Allergies as of 02/05/2024       Reactions   Compazine  [prochlorperazine ] Diarrhea   syncope        Medication List     STOP taking these medications    benazepril  40 MG tablet Commonly known as: LOTENSIN    hydrochlorothiazide  12.5 MG capsule Commonly known as: MICROZIDE    ibuprofen 800 MG tablet Commonly known as: ADVIL   MAGNESIUM  PO   metoprolol  tartrate 25 MG tablet Commonly known as: LOPRESSOR        TAKE these medications  Acetaminophen  Extra Strength 500 MG Tabs Take 1-2 tablets (500-1,000 mg total) by mouth every 6 (six) hours as needed.   amiodarone  200 MG tablet Commonly known as: PACERONE  Take 1 tablet (200 mg total) by mouth 2 (two) times daily for 10 days, then decrease to 1 tablet (200 mg) daily.   atorvastatin  20 MG tablet Commonly known as: LIPITOR Take 1 tablet (20 mg total) by mouth daily.   dexamethasone  4 MG tablet Commonly known as: DECADRON  Take 2 tabs (8 mg) twice daily starting the day before docetaxel , then daily x 3 days after cisplatin .Take with food.   dorzolamide  2 % ophthalmic solution Commonly  known as: TRUSOPT  Administer into right eye twice a day.   fluticasone  50 MCG/ACT nasal spray Commonly known as: FLONASE  Place 1 spray into both nostrils daily.   fluticasone -salmeterol 250-50 MCG/ACT Aepb Commonly known as: Advair  Diskus Inhale 1 puff into the lungs in the morning and at bedtime.   gabapentin  300 MG capsule Commonly known as: Neurontin  Take 1 capsule (300 mg total) by mouth 2 (two) times daily.   hydrocortisone  ointment 0.5 % Apply 1 Application topically 2 (two) times daily.   lidocaine -prilocaine  cream Commonly known as: EMLA  Apply to the Port-A-Cath site 30-60 minutes before chemotherapy   metoCLOPramide 5 MG tablet Commonly known as: Reglan Take 1 tablet (5 mg total) by mouth every 8 (eight) hours as needed for nausea or vomiting.   ondansetron  8 MG tablet Commonly known as: Zofran  Take 1 tablet (8 mg total) by mouth every 8 (eight) hours as needed for nausea or vomiting. Begin on the third day after cisplatin  chemotherapy.   oxyCODONE  5 MG immediate release tablet Commonly known as: Oxy IR/ROXICODONE  Take 1-2 tablets (5-10 mg total) by mouth every 4 (four) hours as needed for moderate pain (pain score 4-6).   pantoprazole  40 MG tablet Commonly known as: PROTONIX  Take 1 tablet (40 mg total) by mouth daily at 12 noon. Start taking on: Feb 06, 2024   valACYclovir 1000 MG tablet Commonly known as: VALTREX Take 1,000 mg by mouth 2 (two) times daily as needed (fever blisters/outbreaks).   VITAMIN C PO Take 1 tablet by mouth in the morning.               Durable Medical Equipment  (From admission, onward)           Start     Ordered   02/05/24 1337  For home use only DME 4 wheeled rolling walker with seat  Once       Question:  Patient needs a walker to treat with the following condition  Answer:  Syncope   02/05/24 1337            Follow-up Information     [facility] Follow up.   Why: Please follow up this  provider for DME needs. Contact information: [address] [place] [address] [phone]                Discharge Exam: Filed Weights   02/02/24 1355  Weight: 81.6 kg   General-appears in no acute distress Heart-S1-S2, regular, no murmur auscultated Lungs-clear to auscultation bilaterally, no wheezing or crackles auscultated Abdomen-soft, nontender, no organomegaly Extremities-no edema in the lower extremities Neuro-alert, oriented x3, no focal deficit noted  Condition at discharge: good  The results of significant diagnostics from this hospitalization (including imaging, microbiology, ancillary and laboratory) are listed below for reference.   Imaging Studies: ECHOCARDIOGRAM COMPLETE Result Date:  02/03/2024    ECHOCARDIOGRAM REPORT   Patient Name:   Edder A Favorite Date of Exam: 02/03/2024 Medical Rec #:  [id number]    Height:       71.0 in Accession #:    [id number]   Weight:       180.0 lb Date of Birth:  [birth year]    BSA:          2.016 m Patient Age:    72 years     BP:           131/76 mmHg Patient Gender: M            HR:           89 bpm. Exam Location:  Inpatient Procedure: 2D Echo, Color Doppler and Cardiac Doppler (Both Spectral and Color            Flow Doppler were utilized during procedure). Indications:    R55 Syncope  History:        Patient has no prior history of Echocardiogram examinations.                 CAD; Risk Factors:Hypertension.  Sonographer:    Andrena Bang Referring Phys: Nance Aw Eran Windish IMPRESSIONS  1. Possible small LVOT gradient but no SAM seen possibly due to poor image quality. Left ventricular ejection fraction, by estimation, is 65 to 70%. The left ventricle has normal function. The left ventricle has no regional wall motion abnormalities. There is moderate left ventricular hypertrophy. Left ventricular diastolic parameters are consistent with Grade I diastolic dysfunction (impaired relaxation).  2. Right ventricular systolic function is normal.  The right ventricular size is normal.  3. The mitral valve is normal in structure. No evidence of mitral valve regurgitation. No evidence of mitral stenosis.  4. The aortic valve is tricuspid. There is mild calcification of the aortic valve. There is mild thickening of the aortic valve. Aortic valve regurgitation is not visualized. Aortic valve sclerosis is present, with no evidence of aortic valve stenosis.  5. The inferior vena cava is normal in size with greater than 50% respiratory variability, suggesting right atrial pressure of 3 mmHg. FINDINGS  Left Ventricle: Possible small LVOT gradient but no SAM seen possibly due to poor image quality. Left ventricular ejection fraction, by estimation, is 65 to 70%. The left ventricle has normal function. The left ventricle has no regional wall motion abnormalities. Definity contrast agent was given IV to delineate the left ventricular endocardial borders. Strain was performed and the global longitudinal strain is indeterminate. The left ventricular internal cavity size was normal in size. There is moderate left ventricular hypertrophy. Left ventricular diastolic parameters are consistent with Grade I diastolic dysfunction (impaired relaxation). Right Ventricle: The right ventricular size is normal. No increase in right ventricular wall thickness. Right ventricular systolic function is normal. Left Atrium: Left atrial size was normal in size. Right Atrium: Right atrial size was normal in size. Pericardium: There is no evidence of pericardial effusion. Mitral Valve: The mitral valve is normal in structure. No evidence of mitral valve regurgitation. No evidence of mitral valve stenosis. Tricuspid Valve: The tricuspid valve is normal in structure. Tricuspid valve regurgitation is trivial. No evidence of tricuspid stenosis. Aortic Valve: The aortic valve is tricuspid. There is mild calcification of the aortic valve. There is mild thickening of the aortic valve. Aortic valve  regurgitation is not visualized. Aortic valve sclerosis is present, with no evidence of aortic valve stenosis. Aortic valve mean gradient  measures 4.0 mmHg. Aortic valve peak gradient measures 9.2 mmHg. Aortic valve area, by VTI measures 2.89 cm. Pulmonic Valve: The pulmonic valve was normal in structure. Pulmonic valve regurgitation is not visualized. No evidence of pulmonic stenosis. Aorta: The aortic root is normal in size and structure. Venous: The inferior vena cava is normal in size with greater than 50% respiratory variability, suggesting right atrial pressure of 3 mmHg. IAS/Shunts: The interatrial septum was not well visualized. Additional Comments: 3D was performed not requiring image post processing on an independent workstation and was indeterminate.  LEFT VENTRICLE PLAX 2D LVIDd:         3.80 cm     Diastology LVIDs:         2.20 cm     LV e' medial:    4.24 cm/s LV PW:         1.40 cm     LV E/e' medial:  17.2 LV IVS:        1.40 cm     LV e' lateral:   10.10 cm/s LVOT diam:     1.90 cm     LV E/e' lateral: 7.2 LV SV:         71 LV SV Index:   35 LVOT Area:     2.84 cm  LV Volumes (MOD) LV vol d, MOD A2C: 84.0 ml LV vol d, MOD A4C: 73.0 ml LV vol s, MOD A2C: 16.7 ml LV vol s, MOD A4C: 16.1 ml LV SV MOD A2C:     67.3 ml LV SV MOD A4C:     73.0 ml LV SV MOD BP:      64.9 ml RIGHT VENTRICLE RV S prime:     22.90 cm/s TAPSE (M-mode): 1.7 cm LEFT ATRIUM             Index LA Vol (A2C):   36.0 ml 17.86 ml/m LA Vol (A4C):   50.8 ml 25.20 ml/m LA Biplane Vol: 44.9 ml 22.27 ml/m  AORTIC VALVE AV Area (Vmax):    3.13 cm AV Area (Vmean):   3.13 cm AV Area (VTI):     2.89 cm AV Vmax:           152.00 cm/s AV Vmean:          94.300 cm/s AV VTI:            0.245 m AV Peak Grad:      9.2 mmHg AV Mean Grad:      4.0 mmHg LVOT Vmax:         168.00 cm/s LVOT Vmean:        104.000 cm/s LVOT VTI:          0.250 m LVOT/AV VTI ratio: 1.02  AORTA Ao Asc diam: 3.60 cm MITRAL VALVE               TRICUSPID VALVE MV Area  (PHT): 2.99 cm    TR Peak grad:   24.4 mmHg MV Decel Time: 254 msec    TR Vmax:        247.00 cm/s MV E velocity: 73.10 cm/s MV A velocity: 87.90 cm/s  SHUNTS MV E/A ratio:  0.83        Systemic VTI:  0.25 m                            Systemic Diam: 1.90 cm Janelle Mediate MD Electronically signed by Janelle Mediate MD Signature  Date/Time: 02/03/2024/4:36:14 PM    Final    DG Chest Portable 1 View Result Date: 02/02/2024 CLINICAL DATA:  Syncope. EXAM: PORTABLE CHEST 1 VIEW COMPARISON:  December 06, 2023. FINDINGS: The heart size and mediastinal contours are within normal limits. Right internal jugular Port-A-Cath is noted in grossly good position. Both lungs are clear. The visualized skeletal structures are unremarkable. IMPRESSION: No active disease. Electronically Signed   By: Rosalene Colon M.D.   On: 02/02/2024 14:33    Microbiology: Results for orders placed or performed during the hospital encounter of 09/29/23  Surgical pcr screen     Status: None   Collection Time: 09/29/23  9:46 AM   Specimen: Nasal Mucosa; Nasal Swab  Result Value Ref Range Status   MRSA, PCR NEGATIVE NEGATIVE Final   Staphylococcus aureus NEGATIVE NEGATIVE Final    Comment: (NOTE) The Xpert SA Assay (FDA approved for NASAL specimens in patients 40 years of age and older), is one component of a comprehensive surveillance program. It is not intended to diagnose infection nor to guide or monitor treatment. Performed at [facility], [address] [place], [place] [address]   SARS CORONAVIRUS 2 (TAT 6-24 HRS) Anterior Nasal Swab     Status: None   Collection Time: 09/29/23  9:46 AM   Specimen: Anterior Nasal Swab  Result Value Ref Range Status   SARS Coronavirus 2 NEGATIVE NEGATIVE Final    Comment: (NOTE) SARS-CoV-2 target nucleic acids are NOT DETECTED.  The SARS-CoV-2 RNA is generally detectable in upper and lower respiratory specimens during the acute phase of infection. Negative results do not preclude  SARS-CoV-2 infection, do not rule out co-infections with other pathogens, and should not be used as the sole basis for treatment or other patient management decisions. Negative results must be combined with clinical observations, patient history, and epidemiological information. The expected result is Negative.  Fact Sheet for Patients: HairSlick.no  Fact Sheet for Healthcare Providers: quierodirigir.com  This test is not yet approved or cleared by the [place]  FDA and  has been authorized for detection and/or diagnosis of SARS-CoV-2 by FDA under an Emergency Use Authorization (EUA). This EUA will remain  in effect (meaning this test can be used) for the duration of the COVID-19 declaration under Se ction 564(b)(1) of the Act, 21 U.S.C. section 360bbb-3(b)(1), unless the authorization is terminated or revoked sooner.  Performed at [facility], [address], [place], [place] [address]     Labs: CBC: Recent Labs  Lab 02/01/24 1452 02/02/24 1353 02/03/24 0316 02/04/24 0416  WBC 2.6* 5.2 5.9 13.5*  NEUTROABS 1.1* 2.4  --   --   HGB 11.5* 11.2* 10.3* 9.3*  HCT 32.7* 34.0* 30.9* 28.9*  MCV 92.6 97.7 96.9 100.3*  PLT 155 152 122* 136*   Basic Metabolic Panel: Recent Labs  Lab 02/01/24 1452 02/02/24 1353 02/03/24 0316 02/04/24 0416 02/05/24 0304 02/05/24 1130  NA 138 135 138 137 139  --   K 3.5 3.9 3.2* 3.0* 3.0* 3.7  CL 98 97* 99 104 106  --   CO2 31 27 28 26 26   --   GLUCOSE 133* 156* 131* 127* 106*  --   BUN 46* 52* 48* 34* 24*  --   CREATININE 1.32* 1.86* 1.53* 1.19 1.12  --   CALCIUM  8.5* 8.7* 7.7* 7.4* 7.8*  --   MG 1.9 1.9  --   --   --  1.6*   Liver Function Tests:  Recent Labs  Lab 02/01/24 1452 02/02/24 1353 02/03/24 0316 02/04/24 0416  AST 11* 16 13* 14*  ALT 12 14 13 12   ALKPHOS 70 67 59 57  BILITOT 0.8 0.9 0.8 0.3  PROT 5.8* 5.6* 5.1* 4.3*  ALBUMIN  3.4* 2.9* 2.5* 2.2*    CBG: Recent Labs  Lab 02/02/24 1350  GLUCAP 158*    Discharge time spent: greater than 30 minutes.  Signed: Ozell Blunt, MD [facility] 02/05/2024

## 2024-02-05 NOTE — Plan of Care (Signed)

## 2024-02-05 NOTE — Plan of Care (Signed)
 Pt and pt family received, educated on, and understands discharge instructions.  All IV accesses discontinued.  Pt DME rolling walker delivered to bedside.  Pt satisfied with all belongings.

## 2024-02-06 ENCOUNTER — Telehealth: Payer: Self-pay | Admitting: Physician Assistant

## 2024-02-06 ENCOUNTER — Encounter: Payer: Self-pay | Admitting: Internal Medicine

## 2024-02-06 ENCOUNTER — Inpatient Hospital Stay

## 2024-02-06 NOTE — Telephone Encounter (Signed)
 Scheduled/rescheduled appointments with the patients spouse. They are aware of the changes made.

## 2024-02-08 ENCOUNTER — Inpatient Hospital Stay: Admitting: Internal Medicine

## 2024-02-08 ENCOUNTER — Inpatient Hospital Stay

## 2024-02-08 VITALS — BP 124/67 | HR 67 | Resp 16

## 2024-02-08 VITALS — BP 134/71 | HR 74 | Temp 97.3°F | Resp 17 | Ht 71.0 in | Wt 202.0 lb

## 2024-02-08 DIAGNOSIS — F17210 Nicotine dependence, cigarettes, uncomplicated: Secondary | ICD-10-CM | POA: Diagnosis not present

## 2024-02-08 DIAGNOSIS — N179 Acute kidney failure, unspecified: Secondary | ICD-10-CM | POA: Diagnosis not present

## 2024-02-08 DIAGNOSIS — Z79899 Other long term (current) drug therapy: Secondary | ICD-10-CM | POA: Diagnosis not present

## 2024-02-08 DIAGNOSIS — C3412 Malignant neoplasm of upper lobe, left bronchus or lung: Secondary | ICD-10-CM

## 2024-02-08 DIAGNOSIS — Z95828 Presence of other vascular implants and grafts: Secondary | ICD-10-CM

## 2024-02-08 DIAGNOSIS — E860 Dehydration: Secondary | ICD-10-CM | POA: Diagnosis not present

## 2024-02-08 DIAGNOSIS — E876 Hypokalemia: Secondary | ICD-10-CM | POA: Diagnosis not present

## 2024-02-08 DIAGNOSIS — Z5189 Encounter for other specified aftercare: Secondary | ICD-10-CM | POA: Diagnosis not present

## 2024-02-08 LAB — CMP (CANCER CENTER ONLY)
ALT: 12 U/L (ref 0–44)
AST: 16 U/L (ref 15–41)
Albumin: 3 g/dL — ABNORMAL LOW (ref 3.5–5.0)
Alkaline Phosphatase: 97 U/L (ref 38–126)
Anion gap: 5 (ref 5–15)
BUN: 13 mg/dL (ref 8–23)
CO2: 30 mmol/L (ref 22–32)
Calcium: 7.8 mg/dL — ABNORMAL LOW (ref 8.9–10.3)
Chloride: 102 mmol/L (ref 98–111)
Creatinine: 1.15 mg/dL (ref 0.61–1.24)
GFR, Estimated: >60 mL/min (ref >60)
Glucose, Bld: 128 mg/dL — ABNORMAL HIGH (ref 70–99)
Potassium: 3.3 mmol/L — ABNORMAL LOW (ref 3.5–5.1)
Sodium: 137 mmol/L (ref 135–145)
Total Bilirubin: 0.3 mg/dL (ref 0.0–1.2)
Total Protein: 5.1 g/dL — ABNORMAL LOW (ref 6.5–8.1)

## 2024-02-08 LAB — CBC WITH DIFFERENTIAL (CANCER CENTER ONLY)
Abs Immature Granulocytes: 0.48 10*3/uL — ABNORMAL HIGH (ref 0.00–0.07)
Basophils Absolute: 0.1 10*3/uL (ref 0.0–0.1)
Basophils Relative: 1 %
Eosinophils Absolute: 0 10*3/uL (ref 0.0–0.5)
Eosinophils Relative: 0 %
HCT: 28.5 % — ABNORMAL LOW (ref 39.0–52.0)
Hemoglobin: 9.9 g/dL — ABNORMAL LOW (ref 13.0–17.0)
Immature Granulocytes: 3 %
Lymphocytes Relative: 7 %
Lymphs Abs: 1.1 10*3/uL (ref 0.7–4.0)
MCH: 32.8 pg (ref 26.0–34.0)
MCHC: 34.7 g/dL (ref 30.0–36.0)
MCV: 94.4 fL (ref 80.0–100.0)
Monocytes Absolute: 0.6 10*3/uL (ref 0.1–1.0)
Monocytes Relative: 4 %
Neutro Abs: 13.4 10*3/uL — ABNORMAL HIGH (ref 1.7–7.7)
Neutrophils Relative %: 85 %
Platelet Count: 205 10*3/uL (ref 150–400)
RBC: 3.02 MIL/uL — ABNORMAL LOW (ref 4.22–5.81)
RDW: 16.8 % — ABNORMAL HIGH (ref 11.5–15.5)
WBC Count: 15.7 10*3/uL — ABNORMAL HIGH (ref 4.0–10.5)
nRBC: 0 % (ref 0.0–0.2)

## 2024-02-08 LAB — MAGNESIUM: Magnesium: 1.3 mg/dL — ABNORMAL LOW (ref 1.7–2.4)

## 2024-02-08 MED ORDER — SODIUM CHLORIDE 0.9% FLUSH
10.0000 mL | Freq: Once | INTRAVENOUS | Status: AC
Start: 2024-02-08 — End: 2024-02-08
  Administered 2024-02-08: 10 mL

## 2024-02-08 MED ORDER — POTASSIUM CHLORIDE 10 MEQ/100ML IV SOLN
10.0000 meq | INTRAVENOUS | Status: AC
  Administered 2024-02-08 (×2): 10 meq via INTRAVENOUS
  Filled 2024-02-08 (×2): qty 100

## 2024-02-08 MED ORDER — MAGNESIUM SULFATE 2 GM/50ML IV SOLN
2.0000 g | Freq: Once | INTRAVENOUS | Status: AC
  Administered 2024-02-08: 2 g via INTRAVENOUS
  Filled 2024-02-08: qty 50

## 2024-02-08 MED ORDER — HEPARIN SOD (PORK) LOCK FLUSH 100 UNIT/ML IV SOLN
500.0000 [IU] | Freq: Once | INTRAVENOUS | Status: AC
Start: 2024-02-08 — End: 2024-02-08
  Administered 2024-02-08: 500 [IU]

## 2024-02-08 MED ORDER — SODIUM CHLORIDE 0.9 % IV SOLN: INTRAVENOUS | Status: AC

## 2024-02-08 MED ORDER — HEPARIN SOD (PORK) LOCK FLUSH 100 UNIT/ML IV SOLN
500.0000 [IU] | Freq: Once | INTRAVENOUS | Status: AC
  Administered 2024-02-08: 500 [IU]

## 2024-02-08 NOTE — Patient Instructions (Signed)
 [facility] - A DEPT OF [facility]  Discharge Instructions: Thank you for choosing [facility] to provide your oncology and hematology care.   If you have a lab appointment with the Cancer Center, please go directly to the Cancer Center and check in at the registration area.   Wear comfortable clothing and clothing appropriate for easy access to any Portacath or PICC line.   We strive to give you quality time with your provider. You may need to reschedule your appointment if you arrive late (15 or more minutes).  Arriving late affects you and other patients whose appointments are after yours.  Also, if you miss three or more appointments without notifying the office, you may be dismissed from the clinic at the provider's discretion.      For prescription refill requests, have your pharmacy contact our office and allow 72 hours for refills to be completed.    Today you received the following chemotherapy and/or immunotherapy agents: Magnesium , Potassium and IVF   To help prevent nausea and vomiting after your treatment, we encourage you to take your nausea medication as directed.  BELOW ARE SYMPTOMS THAT SHOULD BE REPORTED IMMEDIATELY: *FEVER GREATER THAN 100.4 F (38 C) OR HIGHER *CHILLS OR SWEATING *NAUSEA AND VOMITING THAT IS NOT CONTROLLED WITH YOUR NAUSEA MEDICATION *UNUSUAL SHORTNESS OF BREATH *UNUSUAL BRUISING OR BLEEDING *URINARY PROBLEMS (pain or burning when urinating, or frequent urination) *BOWEL PROBLEMS (unusual diarrhea, constipation, pain near the anus) TENDERNESS IN MOUTH AND THROAT WITH OR WITHOUT PRESENCE OF ULCERS (sore throat, sores in mouth, or a toothache) UNUSUAL RASH, SWELLING OR PAIN  UNUSUAL VAGINAL DISCHARGE OR ITCHING   Items with * indicate a potential emergency and should be followed up as soon as possible or go to the Emergency Department if any problems should occur.  Please show the CHEMOTHERAPY ALERT CARD  or IMMUNOTHERAPY ALERT CARD at check-in to the Emergency Department and triage nurse.  Should you have questions after your visit or need to cancel or reschedule your appointment, please contact [facility] - A DEPT OF [facility]  Dept: [phone]  and follow the prompts.  Office hours are 8:00 a.m. to 4:30 p.m. Monday - Friday. Please note that voicemails left after 4:00 p.m. may not be returned until the following business day.  We are closed weekends and major holidays. You have access to a nurse at all times for urgent questions. Please call the main number to the clinic Dept: [phone] and follow the prompts.   For any non-urgent questions, you may also contact your provider using MyChart. We now offer e-Visits for anyone 58 and older to request care online for non-urgent symptoms. For details visit [email or web address].   Also download the MyChart app! Go to the app store, search "MyChart", open the app, select [facility], and log in with your MyChart username and password.  Magnesium  Sulfate Injection What is this medication? MAGNESIUM  SULFATE (mag NEE zee um SUL fate) prevents and treats low levels of magnesium  in your body. It may also be used to prevent and treat seizures during pregnancy in people with high blood pressure disorders, such as preeclampsia or eclampsia. Magnesium  plays an important role in maintaining the health of your muscles and nervous system. This medicine may be used for other purposes; ask your health care provider or pharmacist if you have questions. What should I tell my care team before I  take this medication? They need to know if you have any of these conditions: Heart disease History of irregular heart beat Kidney disease An unusual or allergic reaction to magnesium  sulfate, medications, foods, dyes, or preservatives Pregnant or trying to get pregnant Breast-feeding How should I use this medication? This medication  is for infusion into a vein. It is given in a hospital or clinic setting. Talk to your care team about the use of this medication in children. While this medication may be prescribed for selected conditions, precautions do apply. Overdosage: If you think you have taken too much of this medicine contact a poison control center or emergency room at once. NOTE: This medicine is only for you. Do not share this medicine with others. What if I miss a dose? This does not apply. What may interact with this medication? Certain medications for anxiety or sleep Certain medications for seizures, such phenobarbital Digoxin Medications that relax muscles for surgery Narcotic medications for pain This list may not describe all possible interactions. Give your health care provider a list of all the medicines, herbs, non-prescription drugs, or dietary supplements you use. Also tell them if you smoke, drink alcohol, or use illegal drugs. Some items may interact with your medicine. What should I watch for while using this medication? Your condition will be monitored carefully while you are receiving this medication. You may need blood work done while you are receiving this medication. What side effects may I notice from receiving this medication? Side effects that you should report to your care team as soon as possible: Allergic reactions--skin rash, itching, hives, swelling of the face, lips, tongue, or throat High magnesium  level--confusion, drowsiness, facial flushing, redness, sweating, muscle weakness, fast or irregular heartbeat, trouble breathing Low blood pressure--dizziness, feeling faint or lightheaded, blurry vision Side effects that usually do not require medical attention (report to your care team if they continue or are bothersome): Headache Nausea This list may not describe all possible side effects. Call your doctor for medical advice about side effects. You may report side effects to FDA at  1-800-FDA-1088. Where should I keep my medication? This medication is given in a hospital or clinic and will not be stored at home. NOTE: This sheet is a summary. It may not cover all possible information. If you have questions about this medicine, talk to your doctor, pharmacist, or health care provider.  2024 Elsevier/Gold Standard (2021-05-27 00:00:00)  Potassium Chloride  Injection What is this medication? POTASSIUM CHLORIDE  (poe TASS i um KLOOR ide) prevents and treats low levels of potassium in your body. Potassium plays an important role in maintaining the health of your kidneys, heart, muscles, and nervous system. This medicine may be used for other purposes; ask your health care provider or pharmacist if you have questions. COMMON BRAND NAME(S): PROAMP What should I tell my care team before I take this medication? They need to know if you have any of these conditions: Addison disease Dehydration Diabetes (high blood sugar) Heart disease High levels of potassium in the blood Irregular heartbeat or rhythm Kidney disease Large areas of burned skin An unusual or allergic reaction to potassium, other medications, foods, dyes, or preservatives Pregnant or trying to get pregnant Breast-feeding How should I use this medication? This medication is injected into a vein. It is given in a hospital or clinic setting. Talk to your care team about the use of this medication in children. Special care may be needed. Overdosage: If you think you have taken too much  of this medicine contact a poison control center or emergency room at once. NOTE: This medicine is only for you. Do not share this medicine with others. What if I miss a dose? This does not apply. This medication is not for regular use. What may interact with this medication? Do not take this medication with any of the following: Certain diuretics, such as spironolactone, triamterene Eplerenone Sodium polystyrene sulfonate This  medication may also interact with the following: Certain medications for blood pressure or heart disease, such as lisinopril, losartan, quinapril, valsartan Medications that lower your chance of fighting infection, such as cyclosporine, tacrolimus NSAIDs, medications for pain and inflammation, such as ibuprofen or naproxen Other potassium supplements Salt substitutes This list may not describe all possible interactions. Give your health care provider a list of all the medicines, herbs, non-prescription drugs, or dietary supplements you use. Also tell them if you smoke, drink alcohol, or use illegal drugs. Some items may interact with your medicine. What should I watch for while using this medication? Visit your care team for regular checks on your progress. Tell your care team if your symptoms do not start to get better or if they get worse. You may need blood work while you are taking this medication. Avoid salt substitutes unless you are told otherwise by your care team. What side effects may I notice from receiving this medication? Side effects that you should report to your care team as soon as possible: Allergic reactions--skin rash, itching, hives, swelling of the face, lips, tongue, or throat High potassium level--muscle weakness, fast or irregular heartbeat Side effects that usually do not require medical attention (report to your care team if they continue or are bothersome): Diarrhea Nausea Stomach pain Vomiting This list may not describe all possible side effects. Call your doctor for medical advice about side effects. You may report side effects to FDA at 1-800-FDA-1088. Where should I keep my medication? This medication is given in a hospital or clinic. It will not be stored at home. NOTE: This sheet is a summary. It may not cover all possible information. If you have questions about this medicine, talk to your doctor, pharmacist, or health care provider.  2024 Elsevier/Gold  Standard (2022-03-26 00:00:00)  Rehydration, Older Adult  Rehydration is the replacement of fluids, salts, and minerals in the body (electrolytes) that are lost during dehydration. Dehydration is when there is not enough water  or other fluids in the body. This happens when you lose more fluids than you take in. People who are age 27 or older have a higher risk of dehydration than younger adults. This is because in older age, the body: Is less able to maintain the right amount of water . Does not respond to temperature changes as well. Does not get a sense of thirst as easily or quickly. Other causes include: Not drinking enough fluids. This can occur when you are ill, when you forget to drink, or when you are doing activities that require a lot of energy, especially in hot weather. Conditions that cause loss of water  or other fluids. These include diarrhea, vomiting, sweating, or urinating a lot. Other illnesses, such as fever or infection. Certain medicines, such as those that remove excess fluid from the body (diuretics). Symptoms of mild or moderate dehydration may include thirst, dry lips and mouth, and dizziness. Symptoms of severe dehydration may include increased heart rate, confusion, fainting, and not urinating. In severe cases, you may need to get fluids through an IV at the hospital.  For mild or moderate cases, you can usually rehydrate at home by drinking certain fluids as told by your health care provider. What are the risks? Rehydration is usually safe. Taking in too much fluid (overhydration) can be a problem but is rare. Overhydration can cause an imbalance of electrolytes in the body, kidney failure, fluid in the lungs, or a decrease in salt (sodium) levels in the body. Supplies needed: You will need an oral rehydration solution (ORS) if your health care provider tells you to use one. This is a drink to treat dehydration. It can be found in pharmacies and retail stores. How to  rehydrate Fluids Follow instructions from your health care provider about what to drink. The kind of fluid and the amount you should drink depend on your condition. In general, you should choose drinks that you prefer. If told by your health care provider, drink an ORS. Make an ORS by following instructions on the package. Start by drinking small amounts, about  cup (120 mL) every 5-10 minutes. Slowly increase how much you drink until you have taken in the amount recommended by your health care provider. Drink enough clear fluids to keep your urine pale yellow. If you were told to drink an ORS, finish it first, then start slowly drinking other clear fluids. Drink fluids such as: Water . This includes sparkling and flavored water . Drinking only water  can lead to having too little sodium in your body (hyponatremia). Follow the advice of your health care provider. Water  from ice chips you suck on. Fruit juice with water  added to it(diluted). Sports drinks. Hot or cold herbal teas. Broth-based soups. Coffee. Milk or milk products. Food Follow instructions from your health care provider about what to eat while you rehydrate. Your health care provider may recommend that you slowly begin eating regular foods in small amounts. Eat foods that contain a healthy balance of electrolytes, such as bananas, oranges, potatoes, tomatoes, and spinach. Avoid foods that are greasy or contain a lot of sugar. In some cases, you may get nutrition through a feeding tube that is passed through your nose and into your stomach (nasogastric tube, or NG tube). This may be done if you have uncontrolled vomiting or diarrhea. Drinks to avoid  Certain drinks may make dehydration worse. While you rehydrate, avoid drinking alcohol. How to tell if you are recovering from dehydration You may be getting better if: You are urinating more often than before you started rehydrating. Your urine is pale yellow. Your energy level  improves. You vomit less often. You have diarrhea less often. Your appetite improves or returns to normal. You feel less dizzy or light-headed. Your skin tone and color start to look more normal. Follow these instructions at home: Take over-the-counter and prescription medicines only as told by your health care provider. Do not take sodium tablets. Doing this can lead to having too much sodium in your body (hypernatremia). Contact a health care provider if: You continue to have symptoms of mild or moderate dehydration, such as: Thirst. Dry lips. Slightly dry mouth. Dizziness. Dark urine or less urine than usual. Muscle cramps. You continue to vomit or have diarrhea. Get help right away if: You have symptoms of dehydration that get worse. You have a fever. You have a severe headache. You have been vomiting and have problems, such as: Your vomiting gets worse. Your vomit includes blood or green matter (bile). You cannot eat or drink without vomiting. You have problems with urination or bowel movements, such as:  Diarrhea that gets worse. Blood in your stool (feces). This may cause stool to look black and tarry. Not urinating, or urinating only a small amount of very dark urine, within 6-8 hours. You have trouble breathing. You have symptoms that get worse with treatment. These symptoms may be an emergency. Get help right away. Call 911. Do not wait to see if the symptoms will go away. Do not drive yourself to the hospital. This information is not intended to replace advice given to you by your health care provider. Make sure you discuss any questions you have with your health care provider. Document Revised: 01/27/2022 Document Reviewed: 01/25/2022 Elsevier Patient Education  2024 [employer].

## 2024-02-08 NOTE — Progress Notes (Signed)
 Patient was flushed and had a splotchy rash during administration of second bag of potassium. Dr. Marguerita Shih observed rash via photo and offered 25mg  PO benadryl . Patient declined. No itching or discomfort from flushing/splotchiness. Rosalind Columbia, PA from symptom management came and spoke with patient and wife.

## 2024-02-08 NOTE — Progress Notes (Signed)
 [facility] [phone]   [phone]  OFFICE PROGRESS NOTE  Dorothe Gaster, NP [address] [place] [address]  DIAGNOSIS: Stage IIA (T1c, N1, M0) Squamous cell Carcinoma diagnosed in January of 2025. Diagnosed with NSCLC, squamous cell carcinoma, stage IIB due to lymph node involvement and tumor size (2.9 cm).  PD-L1 TPS was 0%  PRIOR THERAPY: Status post left upper lobectomy with lymph node dissection on October 03, 2023.   CURRENT THERAPY: Systemic chemotherapy with cisplatin  75 Mg/M2 and docetaxel  75 Mg/M2 with Neulasta  support every 3 weeks.  First dose November 16, 2023.  Status post 4 cycles  INTERVAL HISTORY: Cameron Beauchesne [name] 73 y.o. male returns to the clinic today for follow-up visit accompanied by his wife.Discussed the use of AI scribe software for clinical note transcription with the patient, who gave verbal consent to proceed.  History of Present Illness   Cameron [name] is a 73 year old male with stage 2A non-small cell lung cancer who presents with recent syncopal episodes and diarrhea. He is accompanied by his wife, who is actively involved in his care.  He was diagnosed with stage 2A non-small cell lung cancer, squamous cell carcinoma, in January 2025. He underwent a left upper lobectomy with lymph node dissection followed by four cycles of adjuvant systemic chemotherapy with cisplatin  and docetaxel , with the last dose administered on January 25, 2024.  Recently, he experienced syncopal episodes, passing out three times in the shower on Jan 28, 2024. During this time, he also had persistent diarrhea. He was taken to the hospital where he was given IV fluids. He was discharged the same day but continued to experience symptoms, including seeing red lights as yellow and feeling unsteady.  On Feb 02, 2024, he had another syncopal episode while in the car, leading to another hospital visit. His wife reported difficulty reaching medical  staff for guidance during these events. His blood work showed elevated BUN and creatinine levels, and he was also noted to be anemic.  He feels better today but still experiences some dizziness and unsteadiness. He has been eating well since his hospital discharge. He also reports tinnitus, with his ears ringing all day yesterday, though this has improved today. No current diarrhea.         MEDICAL HISTORY: Past Medical History:  Diagnosis Date   Adenomatous colon polyp 05/2009; 11/2014   2010:Tubular adenoma, no high grade dysplasia.[name]  + Hyperplastic polyps. 2016: Tubular adenoma-rpt 5 yrs.   Cancer (HCC)    TURP for prostate cancer   COPD (chronic obstructive pulmonary disease) (HCC)    Coronary artery disease    Difficult intubation    H/O chronic gastritis 05/2009   EGD: no h.pylori,dysplasia,or evidence of malignancy   Hyperlipidemia    lovaza from prior PMD-stopped due to cost   Hypertension    Obesity, Class I, BMI 30.0-34.9 (see actual BMI) 04/23/2014   Tobacco dependence    Ventral hernia    small    ALLERGIES:  is allergic to compazine  [prochlorperazine ].  MEDICATIONS:  Current Outpatient Medications  Medication Sig Dispense Refill   acetaminophen  (TYLENOL ) 500 MG tablet Take 1-2 tablets (500-1,000 mg total) by mouth every 6 (six) hours as needed. 30 tablet 0   amiodarone  (PACERONE ) 200 MG tablet Take 1 tablet (200 mg total) by mouth 2 (two) times daily for 10 days, then decrease to 1 tablet (200 mg) daily. (Patient not taking: Reported on  02/02/2024) 60 tablet 1   Ascorbic Acid (VITAMIN C PO) Take 1 tablet by mouth in the morning. (Patient not taking: Reported on 02/02/2024)     atorvastatin  (LIPITOR) 20 MG tablet Take 1 tablet (20 mg total) by mouth daily. 90 tablet 1   dexamethasone  (DECADRON ) 4 MG tablet Take 2 tabs (8 mg) twice daily starting the day before docetaxel , then daily x 3 days after cisplatin .Take with food. 30 tablet 1   dorzolamide  (TRUSOPT ) 2 % ophthalmic  solution Administer into right eye twice a day. 10 mL 2   fluticasone  (FLONASE ) 50 MCG/ACT nasal spray Place 1 spray into both nostrils daily.     fluticasone -salmeterol (ADVAIR  DISKUS) 250-50 MCG/ACT AEPB Inhale 1 puff into the lungs in the morning and at bedtime. 180 each 0   gabapentin  (NEURONTIN ) 300 MG capsule Take 1 capsule (300 mg total) by mouth 2 (two) times daily. 90 capsule 2   hydrocortisone  ointment 0.5 % Apply 1 Application topically 2 (two) times daily. (Patient not taking: Reported on 02/02/2024) 30 g 0   lidocaine -prilocaine  (EMLA ) cream Apply to the Port-A-Cath site 30-60 minutes before chemotherapy 30 g 0   metoCLOPramide (REGLAN) 5 MG tablet Take 1 tablet (5 mg total) by mouth every 8 (eight) hours as needed for nausea or vomiting. 30 tablet 0   ondansetron  (ZOFRAN ) 8 MG tablet Take 1 tablet (8 mg total) by mouth every 8 (eight) hours as needed for nausea or vomiting. Begin on the third day after cisplatin  chemotherapy. 30 tablet 1   oxyCODONE  (OXY IR/ROXICODONE ) 5 MG immediate release tablet Take 1-2 tablets (5-10 mg total) by mouth every 4 (four) hours as needed for moderate pain (pain score 4-6). 30 tablet 0   pantoprazole  (PROTONIX ) 40 MG tablet Take 1 tablet (40 mg total) by mouth daily at 12 noon. 30 tablet 1   valACYclovir (VALTREX) 1000 MG tablet Take 1,000 mg by mouth 2 (two) times daily as needed (fever blisters/outbreaks).     No current facility-administered medications for this visit.    SURGICAL HISTORY:  Past Surgical History:  Procedure Laterality Date   APPENDECTOMY  09/28/1971   BIOPSY  08/15/2023   Procedure: BIOPSY;  Surgeon: Prudy Brownie, DO;  Location: [facility];  Service: Pulmonary;;   BLADDER DIVERTICULECTOMY N/A 09/30/2022   Procedure: BLADDER DIVERTICULECTOMY;  Surgeon: Florencio Hunting, MD;  Location: [facility];  Service: Urology;  Laterality: N/A;   CARPAL TUNNEL RELEASE Left    CHOLECYSTECTOMY N/A 07/06/2018   Procedure: LAPAROSCOPIC  CHOLECYSTECTOMY WITH INTRAOPERATIVE CHOLANGIOGRAM;  Surgeon: Juanita Norlander, MD;  Location[facility] OR;  Service: General;  Laterality: N/A;   COLONOSCOPY  09/27/2008   [facility]   CYSTOSCOPY N/A 09/30/2022   Procedure: Ardith Bedford;  Surgeon: Florencio Hunting, MD;  Location: [facility];  Service: Urology;  Laterality: N/A;   EYE SURGERY  2018   Cataracts removed only   FINGER SURGERY Left 12/2021   Left middle finger, laceration   HEMOSTASIS CONTROL  08/15/2023   Procedure: HEMOSTASIS CONTROL;  Surgeon: Prudy Brownie, DO;  Location: [facility];  Service: Pulmonary;;   INTERCOSTAL NERVE BLOCK Left 10/03/2023   Procedure: INTERCOSTAL NERVE BLOCK;  Surgeon: Zelphia Higashi, MD;  Location: [facility] OR;  Service: Thoracic;  Laterality: Left;   IR IMAGING GUIDED PORT INSERTION  12/08/2023   LYMPH NODE BIOPSY Left 10/03/2023   Procedure: LYMPH NODE BIOPSY;  Surgeon: Zelphia Higashi, MD;  Location: [facility] OR;  Service: Thoracic;  Laterality: Left;  ROBOT ASSISTED LAPAROSCOPIC RADICAL PROSTATECTOMY N/A 09/30/2022   Procedure: XI ROBOTIC ASSISTED LAPAROSCOPIC RADICAL PROSTATECTOMY LEVEL 3;  Surgeon: Florencio Hunting, MD;  Location: [facility];  Service: Urology;  Laterality: N/A;  270 MINUTES NEEDED FOR CASE   STRABISMUS SURGERY  age 25 or 9   TRANSURETHRAL RESECTION OF PROSTATE N/A 07/08/2022   Procedure: TRANSURETHRAL RESECTION OF THE PROSTATE (TURP)/ CYSTOSCOPY;  Surgeon: Florencio Hunting, MD;  Location: [facility];  Service: Urology;  Laterality: N/A;   VIDEO BRONCHOSCOPY N/A 08/15/2023   Procedure: VIDEO BRONCHOSCOPY WITHOUT FLUORO;  Surgeon: Prudy Brownie, DO;  Location: [facility];  Service: Pulmonary;  Laterality: N/A;    REVIEW OF SYSTEMS:  Constitutional: positive for fatigue Eyes: negative Ears, nose, mouth, throat, and face: negative Respiratory: negative Cardiovascular: negative Gastrointestinal: negative Genitourinary:negative Integument/breast: negative Hematologic/lymphatic:  negative Musculoskeletal:negative Neurological: positive for dizziness Behavioral/Psych: negative Endocrine: negative Allergic/Immunologic: negative   PHYSICAL EXAMINATION: General appearance: alert, cooperative, fatigued, and no distress Head: Normocephalic, without obvious abnormality, atraumatic Neck: no adenopathy, no JVD, supple, symmetrical, trachea midline, and thyroid  not enlarged, symmetric, no tenderness/mass/nodules Lymph nodes: Cervical, supraclavicular, and axillary nodes normal. Resp: clear to auscultation bilaterally Back: symmetric, no curvature. ROM normal. No CVA tenderness. Cardio: regular rate and rhythm, S1, S2 normal, no murmur, click, rub or gallop GI: soft, non-tender; bowel sounds normal; no masses,  no organomegaly Extremities: extremities normal, atraumatic, no cyanosis or edema Neurologic: Alert and oriented X 3, normal strength and tone. Normal symmetric reflexes. Normal coordination and gait  ECOG PERFORMANCE STATUS: 1 - Symptomatic but completely ambulatory  Blood pressure 134/71, pulse 74, temperature (!) 97.3 F (36.3 C), temperature source Temporal, resp. rate 17, height 5\' 11"  (1.803 m), weight 202 lb (91.6 kg), SpO2 100%.  LABORATORY DATA: Lab Results  Component Value Date   WBC 15.7 (H) 02/08/2024   HGB 9.9 (L) 02/08/2024   HCT 28.5 (L) 02/08/2024   MCV 94.4 02/08/2024   PLT 205 02/08/2024      Chemistry      Component Value Date/Time   NA 137 02/08/2024 1113   K 3.3 (L) 02/08/2024 1113   CL 102 02/08/2024 1113   CO2 30 02/08/2024 1113   BUN 13 02/08/2024 1113   CREATININE 1.15 02/08/2024 1113      Component Value Date/Time   CALCIUM  7.8 (L) 02/08/2024 1113   ALKPHOS 97 02/08/2024 1113   AST 16 02/08/2024 1113   ALT 12 02/08/2024 1113   BILITOT 0.3 02/08/2024 1113       RADIOGRAPHIC STUDIES: ECHOCARDIOGRAM COMPLETE Result Date: 02/03/2024    ECHOCARDIOGRAM REPORT   Patient Name:   Cameron [name] Date of Exam: 02/03/2024 Medical  Rec #:  [id number]    Height:       71.0 in Accession #:    [id number]   Weight:       180.0 lb Date of Birth:  [birth year]    BSA:          2.016 m Patient Age:    72 years     BP:           131/76 mmHg Patient Gender: M            HR:           89 bpm. Exam Location:  Inpatient Procedure: 2D Echo, Color Doppler and Cardiac Doppler (Both Spectral and Color            Flow Doppler were utilized during procedure). Indications:  R55 Syncope  History:        Patient has no prior history of Echocardiogram examinations.                 CAD; Risk Factors:Hypertension.  Sonographer:    Andrena Bang Referring Phys: Nance Aw LAMA IMPRESSIONS  1. Possible small LVOT gradient but no SAM seen possibly due to poor image quality. Left ventricular ejection fraction, by estimation, is 65 to 70%. The left ventricle has normal function. The left ventricle has no regional wall motion abnormalities. There is moderate left ventricular hypertrophy. Left ventricular diastolic parameters are consistent with Grade I diastolic dysfunction (impaired relaxation).  2. Right ventricular systolic function is normal. The right ventricular size is normal.  3. The mitral valve is normal in structure. No evidence of mitral valve regurgitation. No evidence of mitral stenosis.  4. The aortic valve is tricuspid. There is mild calcification of the aortic valve. There is mild thickening of the aortic valve. Aortic valve regurgitation is not visualized. Aortic valve sclerosis is present, with no evidence of aortic valve stenosis.  5. The inferior vena cava is normal in size with greater than 50% respiratory variability, suggesting right atrial pressure of 3 mmHg. FINDINGS  Left Ventricle: Possible small LVOT gradient but no SAM seen possibly due to poor image quality. Left ventricular ejection fraction, by estimation, is 65 to 70%. The left ventricle has normal function. The left ventricle has no regional wall motion abnormalities. Definity contrast  agent was given IV to delineate the left ventricular endocardial borders. Strain was performed and the global longitudinal strain is indeterminate. The left ventricular internal cavity size was normal in size. There is moderate left ventricular hypertrophy. Left ventricular diastolic parameters are consistent with Grade I diastolic dysfunction (impaired relaxation). Right Ventricle: The right ventricular size is normal. No increase in right ventricular wall thickness. Right ventricular systolic function is normal. Left Atrium: Left atrial size was normal in size. Right Atrium: Right atrial size was normal in size. Pericardium: There is no evidence of pericardial effusion. Mitral Valve: The mitral valve is normal in structure. No evidence of mitral valve regurgitation. No evidence of mitral valve stenosis. Tricuspid Valve: The tricuspid valve is normal in structure. Tricuspid valve regurgitation is trivial. No evidence of tricuspid stenosis. Aortic Valve: The aortic valve is tricuspid. There is mild calcification of the aortic valve. There is mild thickening of the aortic valve. Aortic valve regurgitation is not visualized. Aortic valve sclerosis is present, with no evidence of aortic valve stenosis. Aortic valve mean gradient measures 4.0 mmHg. Aortic valve peak gradient measures 9.2 mmHg. Aortic valve area, by VTI measures 2.89 cm. Pulmonic Valve: The pulmonic valve was normal in structure. Pulmonic valve regurgitation is not visualized. No evidence of pulmonic stenosis. Aorta: The aortic root is normal in size and structure. Venous: The inferior vena cava is normal in size with greater than 50% respiratory variability, suggesting right atrial pressure of 3 mmHg. IAS/Shunts: The interatrial septum was not well visualized. Additional Comments: 3D was performed not requiring image post processing on an independent workstation and was indeterminate.  LEFT VENTRICLE PLAX 2D LVIDd:         3.80 cm     Diastology LVIDs:          2.20 cm     LV e' medial:    4.24 cm/s LV PW:         1.40 cm     LV E/e' medial:  17.2 LV  IVS:        1.40 cm     LV e' lateral:   10.10 cm/s LVOT diam:     1.90 cm     LV E/e' lateral: 7.2 LV SV:         71 LV SV Index:   35 LVOT Area:     2.84 cm  LV Volumes (MOD) LV vol d, MOD A2C: 84.0 ml LV vol d, MOD A4C: 73.0 ml LV vol s, MOD A2C: 16.7 ml LV vol s, MOD A4C: 16.1 ml LV SV MOD A2C:     67.3 ml LV SV MOD A4C:     73.0 ml LV SV MOD BP:      64.9 ml RIGHT VENTRICLE RV S prime:     22.90 cm/s TAPSE (M-mode): 1.7 cm LEFT ATRIUM             Index LA Vol (A2C):   36.0 ml 17.86 ml/m LA Vol (A4C):   50.8 ml 25.20 ml/m LA Biplane Vol: 44.9 ml 22.27 ml/m  AORTIC VALVE AV Area (Vmax):    3.13 cm AV Area (Vmean):   3.13 cm AV Area (VTI):     2.89 cm AV Vmax:           152.00 cm/s AV Vmean:          94.300 cm/s AV VTI:            0.245 m AV Peak Grad:      9.2 mmHg AV Mean Grad:      4.0 mmHg LVOT Vmax:         168.00 cm/s LVOT Vmean:        104.000 cm/s LVOT VTI:          0.250 m LVOT/AV VTI ratio: 1.02  AORTA Ao Asc diam: 3.60 cm MITRAL VALVE               TRICUSPID VALVE MV Area (PHT): 2.99 cm    TR Peak grad:   24.4 mmHg MV Decel Time: 254 msec    TR Vmax:        247.00 cm/s MV E velocity: 73.10 cm/s MV A velocity: 87.90 cm/s  SHUNTS MV E/A ratio:  0.83        Systemic VTI:  0.25 m                            Systemic Diam: 1.90 cm Janelle Mediate MD Electronically signed by Janelle Mediate MD Signature Date/Time: 02/03/2024/4:36:14 PM    Final    DG Chest Portable 1 View Result Date: 02/02/2024 CLINICAL DATA:  Syncope. EXAM: PORTABLE CHEST 1 VIEW COMPARISON:  December 06, 2023. FINDINGS: The heart size and mediastinal contours are within normal limits. Right internal jugular Port-A-Cath is noted in grossly good position. Both lungs are clear. The visualized skeletal structures are unremarkable. IMPRESSION: No active disease. Electronically Signed   By: Rosalene Colon M.D.   On: 02/02/2024 14:33      ASSESSMENT AND PLAN: This is a very pleasant 73 years old white male with Stage IIA (T1c, N1, M0) Squamous cell Carcinoma diagnosed in January of 2025. Diagnosed with NSCLC, squamous cell carcinoma, stage IIB due to lymph node involvement and tumor size (2.9 cm). Status post left upper lobectomy with lymph node dissection on October 03, 2023.  He has negative PD-L1 expression. The patient underwent adjuvant systemic chemotherapy with cisplatin  75 Mg/M2 and  docetaxel  75 Mg/M2 with Neulasta  support status post 4 cycles.  He has been tolerating the treatment fairly well except for fatigue and lack of appetite in addition to the recent diarrhea and hypotension.  He is feeling much better today. Assessment and Plan    Stage 2A non-small cell lung cancer, squamous cell carcinoma Status post left upper lobectomy with lymph node dissection and four cycles of adjuvant systemic chemotherapy with cisplatin  and docetaxel . Last chemotherapy dose was on January 25, 2024. No further chemotherapy planned. Scheduled for a CT scan of the chest on Feb 21, 2024, to assess for recurrence or metastasis. If clear, follow-up will be every several months without additional chemotherapy. - Order CT scan of the chest on Feb 21, 2024  Anemia Likely secondary to recent chemotherapy. Hemoglobin is 9.9, expected to recover to normal levels over weeks to months. No immediate intervention required, monitoring planned.  Tinnitus Intermittent, possibly related to cisplatin  chemotherapy. Symptoms were present yesterday but not today. Monitoring for recurrence or persistence.  Dehydration and acute kidney injury Recent episode likely secondary to diarrhea and inadequate fluid intake. Hospitalized with elevated BUN and creatinine, now improved. Current BUN is 13 and creatinine is 1.15, indicating resolution. Emphasized importance of hydration to prevent recurrence. - Administer IV fluids today  Hypomagnesemia Magnesium  level  is low at 1.3, likely due to recent chemotherapy and inadequate intake. Plan to correct with IV supplementation. - Administer IV magnesium  today  Hypokalemia Potassium level is low at 3.3, likely due to recent chemotherapy and inadequate intake. Plan to correct with IV supplementation. - Administer IV potassium today   Emergency Issues: - She was advised to call 911 if there an emergency situation.   The patient was advised to call immediately if he has any other concerning symptoms in the interval.  The patient voices understanding of current disease status and treatment options and is in agreement with the current care plan.  All questions were answered. The patient knows to call the clinic with any problems, questions or concerns. We can certainly see the patient much sooner if necessary.  The total time spent in the appointment was 30 minutes.  Disclaimer: This note was dictated with voice recognition software. Similar sounding words can inadvertently be transcribed and may not be corrected upon review.

## 2024-02-09 ENCOUNTER — Encounter (HOSPITAL_COMMUNITY): Payer: Self-pay

## 2024-02-09 ENCOUNTER — Other Ambulatory Visit: Payer: Self-pay

## 2024-02-09 ENCOUNTER — Emergency Department (HOSPITAL_COMMUNITY)

## 2024-02-09 ENCOUNTER — Emergency Department (HOSPITAL_COMMUNITY)
Admission: EM | Admit: 2024-02-09 | Discharge: 2024-02-10 | Disposition: A | Discharge status: Home / Self Care | Attending: Emergency Medicine | Admitting: Emergency Medicine

## 2024-02-09 DIAGNOSIS — R339 Retention of urine, unspecified: Secondary | ICD-10-CM | POA: Insufficient documentation

## 2024-02-09 DIAGNOSIS — I2510 Atherosclerotic heart disease of native coronary artery without angina pectoris: Secondary | ICD-10-CM | POA: Diagnosis not present

## 2024-02-09 DIAGNOSIS — J449 Chronic obstructive pulmonary disease, unspecified: Secondary | ICD-10-CM | POA: Insufficient documentation

## 2024-02-09 DIAGNOSIS — R531 Weakness: Secondary | ICD-10-CM | POA: Diagnosis not present

## 2024-02-09 DIAGNOSIS — R2243 Localized swelling, mass and lump, lower limb, bilateral: Secondary | ICD-10-CM | POA: Diagnosis not present

## 2024-02-09 DIAGNOSIS — I10 Essential (primary) hypertension: Secondary | ICD-10-CM | POA: Diagnosis not present

## 2024-02-09 DIAGNOSIS — Z79899 Other long term (current) drug therapy: Secondary | ICD-10-CM | POA: Insufficient documentation

## 2024-02-09 DIAGNOSIS — R600 Localized edema: Secondary | ICD-10-CM | POA: Insufficient documentation

## 2024-02-09 DIAGNOSIS — M7989 Other specified soft tissue disorders: Secondary | ICD-10-CM | POA: Diagnosis not present

## 2024-02-09 DIAGNOSIS — F17210 Nicotine dependence, cigarettes, uncomplicated: Secondary | ICD-10-CM | POA: Diagnosis not present

## 2024-02-09 DIAGNOSIS — R770 Abnormality of albumin: Secondary | ICD-10-CM | POA: Diagnosis not present

## 2024-02-09 DIAGNOSIS — Z8546 Personal history of malignant neoplasm of prostate: Secondary | ICD-10-CM | POA: Diagnosis not present

## 2024-02-09 DIAGNOSIS — I700 Atherosclerosis of aorta: Secondary | ICD-10-CM | POA: Diagnosis not present

## 2024-02-09 DIAGNOSIS — E876 Hypokalemia: Secondary | ICD-10-CM | POA: Insufficient documentation

## 2024-02-09 LAB — CBC WITH DIFFERENTIAL/PLATELET
Abs Immature Granulocytes: 0.31 10*3/uL — ABNORMAL HIGH (ref 0.00–0.07)
Basophils Absolute: 0 10*3/uL (ref 0.0–0.1)
Basophils Relative: 0 %
Eosinophils Absolute: 0 10*3/uL (ref 0.0–0.5)
Eosinophils Relative: 0 %
HCT: 28.3 % — ABNORMAL LOW (ref 39.0–52.0)
Hemoglobin: 9.5 g/dL — ABNORMAL LOW (ref 13.0–17.0)
Immature Granulocytes: 2 %
Lymphocytes Relative: 10 %
Lymphs Abs: 1.6 10*3/uL (ref 0.7–4.0)
MCH: 32.6 pg (ref 26.0–34.0)
MCHC: 33.6 g/dL (ref 30.0–36.0)
MCV: 97.3 fL (ref 80.0–100.0)
Monocytes Absolute: 0.7 10*3/uL (ref 0.1–1.0)
Monocytes Relative: 4 %
Neutro Abs: 12.9 10*3/uL — ABNORMAL HIGH (ref 1.7–7.7)
Neutrophils Relative %: 84 %
Platelets: 261 10*3/uL (ref 150–400)
RBC: 2.91 MIL/uL — ABNORMAL LOW (ref 4.22–5.81)
RDW: 17.1 % — ABNORMAL HIGH (ref 11.5–15.5)
WBC: 15.5 10*3/uL — ABNORMAL HIGH (ref 4.0–10.5)
nRBC: 0 % (ref 0.0–0.2)

## 2024-02-09 LAB — URINALYSIS, W/ REFLEX TO CULTURE (INFECTION SUSPECTED)
Bacteria, UA: NONE SEEN
Bilirubin Urine: NEGATIVE
Glucose, UA: NEGATIVE mg/dL
Ketones, ur: NEGATIVE mg/dL
Nitrite: NEGATIVE
Protein, ur: NEGATIVE mg/dL
Specific Gravity, Urine: 1.006 (ref 1.005–1.030)
WBC, UA: >50 WBC/hpf (ref 0–5)
pH: 6 (ref 5.0–8.0)

## 2024-02-09 LAB — COMPREHENSIVE METABOLIC PANEL WITH GFR
ALT: 15 U/L (ref 0–44)
AST: 19 U/L (ref 15–41)
Albumin: 2.6 g/dL — ABNORMAL LOW (ref 3.5–5.0)
Alkaline Phosphatase: 96 U/L (ref 38–126)
Anion gap: 5 (ref 5–15)
BUN: 12 mg/dL (ref 8–23)
CO2: 30 mmol/L (ref 22–32)
Calcium: 7.9 mg/dL — ABNORMAL LOW (ref 8.9–10.3)
Chloride: 103 mmol/L (ref 98–111)
Creatinine, Ser: 1.09 mg/dL (ref 0.61–1.24)
GFR, Estimated: >60 mL/min (ref >60)
Glucose, Bld: 118 mg/dL — ABNORMAL HIGH (ref 70–99)
Potassium: 3.2 mmol/L — ABNORMAL LOW (ref 3.5–5.1)
Sodium: 138 mmol/L (ref 135–145)
Total Bilirubin: 0.4 mg/dL (ref 0.0–1.2)
Total Protein: 5.1 g/dL — ABNORMAL LOW (ref 6.5–8.1)

## 2024-02-09 LAB — BRAIN NATRIURETIC PEPTIDE: B Natriuretic Peptide: 224.8 pg/mL — ABNORMAL HIGH (ref 0.0–100.0)

## 2024-02-09 MED ORDER — CEFDINIR 300 MG PO CAPS
300.0000 mg | ORAL_CAPSULE | Freq: Two times a day (BID) | ORAL | 0 refills | Status: DC
  Filled 2024-02-09: qty 14, 7d supply, fill #0

## 2024-02-09 MED ORDER — POTASSIUM CHLORIDE 20 MEQ PO PACK
40.0000 meq | PACK | Freq: Once | ORAL | Status: AC
  Administered 2024-02-10: 40 meq via ORAL
  Filled 2024-02-09: qty 2

## 2024-02-09 MED ORDER — SODIUM CHLORIDE 0.9 % IV SOLN
1.0000 g | Freq: Once | INTRAVENOUS | Status: AC
  Administered 2024-02-10: 1 g via INTRAVENOUS
  Filled 2024-02-09: qty 10

## 2024-02-09 MED ORDER — SODIUM CHLORIDE 0.9 % IV BOLUS: 1000.0000 mL | Freq: Once | INTRAVENOUS | Status: DC

## 2024-02-09 NOTE — ED Notes (Signed)
 Bladder scan volume 

## 2024-02-09 NOTE — ED Triage Notes (Signed)
 Pt ambulatory to triage with c/o swelling to bilateral legs x today. Pt reports he finished his last chemo treatment 3 weeks ago. Pt was admitted last Thursday for potential dehydration per wife. Wife reports three falls the week before. 7 lb weight gain since yesterday. Pt denies SOB, chest pain, nausea and vomiting.

## 2024-02-09 NOTE — Discharge Instructions (Addendum)
 We evaluated you in the emergency department for your urine issues and your leg swelling.  You were retaining urine.  We placed a Foley catheter.  Your testing shows that you may have a urine infection.  We have sent a urine culture and started you on antibiotics.  Please take these as prescribed.  We also evaluated you for your leg swelling.  Your testing in the emergency department was reassuring including your chest x-ray.  We do not think your leg swelling is due to a dangerous cause.  It is most likely related to stopping your HCTZ.  Please resume this but be careful given that you were in the hospital recently for dehydration.  Please follow-up very closely with your primary doctor, your oncologist, and your urologist.  If any new or worsening symptoms such as fevers, weakness, lightheadedness or dizziness, fainting, or any other concerning symptoms, please return to the emergency department.

## 2024-02-10 ENCOUNTER — Telehealth: Payer: Self-pay | Admitting: Medical Oncology

## 2024-02-10 ENCOUNTER — Ambulatory Visit: Admitting: Nurse Practitioner

## 2024-02-10 ENCOUNTER — Other Ambulatory Visit (HOSPITAL_COMMUNITY): Payer: Self-pay

## 2024-02-10 ENCOUNTER — Ambulatory Visit: Payer: Self-pay

## 2024-02-10 VITALS — BP 132/68 | HR 80 | Temp 98.1°F | Ht 71.0 in | Wt 203.0 lb

## 2024-02-10 DIAGNOSIS — Z09 Encounter for follow-up examination after completed treatment for conditions other than malignant neoplasm: Secondary | ICD-10-CM

## 2024-02-10 DIAGNOSIS — R600 Localized edema: Secondary | ICD-10-CM | POA: Diagnosis not present

## 2024-02-10 DIAGNOSIS — R296 Repeated falls: Secondary | ICD-10-CM

## 2024-02-10 DIAGNOSIS — R339 Retention of urine, unspecified: Secondary | ICD-10-CM | POA: Insufficient documentation

## 2024-02-10 LAB — CBC
HCT: 30.4 % — ABNORMAL LOW (ref 39.0–52.0)
Hemoglobin: 10.4 g/dL — ABNORMAL LOW (ref 13.0–17.0)
MCHC: 34.1 g/dL (ref 30.0–36.0)
MCV: 95.7 fl (ref 78.0–100.0)
Platelets: 276 10*3/uL (ref 150.0–400.0)
RBC: 3.18 Mil/uL — ABNORMAL LOW (ref 4.22–5.81)
RDW: 17.9 % — ABNORMAL HIGH (ref 11.5–15.5)
WBC: 13.2 10*3/uL — ABNORMAL HIGH (ref 4.0–10.5)

## 2024-02-10 LAB — MAGNESIUM: Magnesium: 1.7 mg/dL (ref 1.5–2.5)

## 2024-02-10 LAB — BASIC METABOLIC PANEL WITH GFR
BUN: 9 mg/dL (ref 6–23)
CO2: 32 meq/L (ref 19–32)
Calcium: 8.2 mg/dL — ABNORMAL LOW (ref 8.4–10.5)
Chloride: 102 meq/L (ref 96–112)
Creatinine, Ser: 1.14 mg/dL (ref 0.40–1.50)
GFR: 64.97 mL/min (ref >60.00)
Glucose, Bld: 123 mg/dL — ABNORMAL HIGH (ref 70–99)
Potassium: 3.8 meq/L (ref 3.5–5.1)
Sodium: 141 meq/L (ref 135–145)

## 2024-02-10 MED ORDER — HEPARIN SOD (PORK) LOCK FLUSH 100 UNIT/ML IV SOLN
500.0000 [IU] | Freq: Once | INTRAVENOUS | Status: AC
  Administered 2024-02-10: 500 [IU]
  Filled 2024-02-10: qty 5

## 2024-02-10 NOTE — Patient Instructions (Signed)
 Nice to see you today I will be in touch with the labs once I have them Follow up with me in 1 month

## 2024-02-10 NOTE — Telephone Encounter (Signed)
 Spoke with patient's wife today. She stated that Cameron [name] has an appointment with PCP today. Cameron [name]'s last  chemotherapy session was on 04/30. She expressed frustration related to his multiple recent ED visits and voiced feelings of caregiver stress. While I am not fully aware of the dynamics of their relationship, I do have some concerns based on her tone and demeanor during our conversation.

## 2024-02-10 NOTE — Assessment & Plan Note (Signed)
 Patient currently has indwelling urinary catheter.  This was placed by the emergency department he does have a follow-up with urology within the week.  Encourage patient and family member to pick up antibiotics prescribed by the emergency department and take them as prescribed

## 2024-02-10 NOTE — Progress Notes (Signed)
 Acute Office Visit  Subjective:     Patient ID: Cameron [name], male    DOB: [birth year], 73 y.o.   MRN: [id number]  Chief Complaint  Patient presents with   Leg Swelling   Urinary Retention   Hospitalization Follow-up    Pt complains of not doing well since leaving the hospital. Pt and wife are very concerned about not getting any answers for labs that are dropping. Pt's wife complains of pt passing out, abnormal eye sight from chemo. Pt wife states of fluid in pts chest.     HPI Patient is in today for hospital follow-up  Patient was seen in the emergency department on 02/09/2024 with a complaint of urinary retention and lower extremity edema.  Patient is currently undergoing cancer treatment.  Last chemotherapy was on 01/25/2024.  Patient did undergo chest x-ray which was negative lab work which showed electrolyte derangement and slightly elevated BUN NP.  Patient also had a urine done urinalysis was suspicious for UTI patient was given Rocephin  IM and discharged on cefdinir 300 twice daily for 7 days.  Culture is in progress.  Patient is here for follow-up Of note patient has several emergency department visit patient has had 1 on 01/28/2024 and admission on 02/02/2024 and then 1 yesterday on 02/09/2024.  Patient did have a Coley catheter in place and is scheduled to see Florencio Hunting, MD at [facility] within a week  Review of Systems  Constitutional:  Negative for chills and fever.  Respiratory:  Negative for shortness of breath.   Cardiovascular:  Negative for chest pain.  Neurological:  Negative for headaches.  Psychiatric/Behavioral:  Negative for hallucinations and suicidal ideas.         Objective:     BP 132/68   Pulse 80   Temp 98.1 F (36.7 C) (Oral)   Ht 5\' 11"  (1.803 m)   Wt 203 lb (92.1 kg)   SpO2 98%   BMI 28.31 kg/m  BP Readings from Last 3 Encounters:  02/10/24 132/68  02/09/24 (!) 152/87  02/08/24 124/67   Wt Readings from Last 3 Encounters:   02/10/24 203 lb (92.1 kg)  02/09/24 205 lb (93 kg)  02/08/24 202 lb (91.6 kg)   SpO2 Readings from Last 3 Encounters:  02/10/24 98%  02/09/24 100%  02/08/24 99%      Physical Exam Vitals and nursing note reviewed.  Constitutional:      Appearance: Normal appearance.     Comments: Rollating walker in clinic  Cardiovascular:     Rate and Rhythm: Normal rate and regular rhythm.     Heart sounds: Normal heart sounds.  Pulmonary:     Effort: Pulmonary effort is normal.     Breath sounds: Normal breath sounds.  Musculoskeletal:     Right lower leg: Edema present.     Left lower leg: Edema (L>R) present.  Neurological:     Mental Status: He is alert.     Results for orders placed or performed during the hospital encounter of 02/09/24  Comprehensive metabolic panel  Result Value Ref Range   Sodium 138 135 - 145 mmol/L   Potassium 3.2 (L) 3.5 - 5.1 mmol/L   Chloride 103 98 - 111 mmol/L   CO2 30 22 - 32 mmol/L   Glucose, Bld 118 (H) 70 - 99 mg/dL   BUN 12 8 - 23 mg/dL   Creatinine, Ser 7.82 0.61 - 1.24 mg/dL   Calcium  7.9 (L) 8.9 - 10.3 mg/dL  Total Protein 5.1 (L) 6.5 - 8.1 g/dL   Albumin  2.6 (L) 3.5 - 5.0 g/dL   AST 19 15 - 41 U/L   ALT 15 0 - 44 U/L   Alkaline Phosphatase 96 38 - 126 U/L   Total Bilirubin 0.4 0.0 - 1.2 mg/dL   GFR, Estimated >65 >78 mL/min   Anion gap 5 5 - 15  CBC with Differential  Result Value Ref Range   WBC 15.5 (H) 4.0 - 10.5 K/uL   RBC 2.91 (L) 4.22 - 5.81 MIL/uL   Hemoglobin 9.5 (L) 13.0 - 17.0 g/dL   HCT 46.9 (L) 62.9 - 52.8 %   MCV 97.3 80.0 - 100.0 fL   MCH 32.6 26.0 - 34.0 pg   MCHC 33.6 30.0 - 36.0 g/dL   RDW 41.3 (H) 24.4 - 01.0 %   Platelets 261 150 - 400 K/uL   nRBC 0.0 0.0 - 0.2 %   Neutrophils Relative % 84 %   Neutro Abs 12.9 (H) 1.7 - 7.7 K/uL   Lymphocytes Relative 10 %   Lymphs Abs 1.6 0.7 - 4.0 K/uL   Monocytes Relative 4 %   Monocytes Absolute 0.7 0.1 - 1.0 K/uL   Eosinophils Relative 0 %   Eosinophils Absolute  0.0 0.0 - 0.5 K/uL   Basophils Relative 0 %   Basophils Absolute 0.0 0.0 - 0.1 K/uL   Immature Granulocytes 2 %   Abs Immature Granulocytes 0.31 (H) 0.00 - 0.07 K/uL  Brain natriuretic peptide  Result Value Ref Range   B Natriuretic Peptide 224.8 (H) 0.0 - 100.0 pg/mL  Urinalysis, w/ Reflex to Culture (Infection Suspected) -Urine, Clean Catch  Result Value Ref Range   Specimen Source URINE, CLEAN CATCH    Color, Urine YELLOW YELLOW   APPearance HAZY (A) CLEAR   Specific Gravity, Urine 1.006 1.005 - 1.030   pH 6.0 5.0 - 8.0   Glucose, UA NEGATIVE NEGATIVE mg/dL   Hgb urine dipstick SMALL (A) NEGATIVE   Bilirubin Urine NEGATIVE NEGATIVE   Ketones, ur NEGATIVE NEGATIVE mg/dL   Protein, ur NEGATIVE NEGATIVE mg/dL   Nitrite NEGATIVE NEGATIVE   Leukocytes,Ua MODERATE (A) NEGATIVE   RBC / HPF 0-5 0 - 5 RBC/hpf   WBC, UA >50 0 - 5 WBC/hpf   Bacteria, UA NONE SEEN NONE SEEN   Squamous Epithelial / HPF 0-5 0 - 5 /HPF   WBC Clumps PRESENT    Mucus PRESENT         Assessment & Plan:   Problem List Items Addressed This Visit       Genitourinary   Urinary retention   Patient currently has indwelling urinary catheter.  This was placed by the emergency department he does have a follow-up with urology within the week.  Encourage patient and family member to pick up antibiotics prescribed by the emergency department and take them as prescribed        Other   Multiple falls   Patient having multiple falls at home now using an ambulation aid.  Will do home health for gait evaluation and strength training.      Relevant Orders   Ambulatory referral to Home Health   Lower extremity edema   Multifactorial.  Patient recently had echocardiogram and being his months that showed a normal EF.  Patient was on HCTZ in the past but then had an episode of dehydration and syncope.  Per patient report currently maintained on 12.5 HCTZ.  We discussed that this  will help with the edema but also  exacerbate his hypokalemia.  Patient to also elevate legs while at rest and use compression garments to the day.      Hospital discharge follow-up - Primary   Did review most recent ED note along with imaging and lab work.  Patient was placed Protonix  40 mg daily in hospital he did have a DuoNeb for 30 days and then titrate down to 20 mg for 30 days and then anticipate coming off.  Discussed that this can cause hypomagnesia and which can result in hypokalemia.  May need to consider supplementation of both electrolytes.  States he was doing 600 mg of magnesium  and encouraged him to stop as it could be causing diarrhea      Relevant Orders   Basic metabolic panel with GFR   CBC   Magnesium     No orders of the defined types were placed in this encounter.   Return in about 4 weeks (around 03/09/2024) for CPE and Labs.  Margarie Shay, NP

## 2024-02-10 NOTE — Telephone Encounter (Signed)
  Chief Complaint: swelling to lower legs, multiple ED visits, wife frustrated and wanting to why he is passing out and getting worse Symptoms: legs are "tight" Frequency: 2 days  Disposition: [] ED /[] Urgent Care (no appt availability in office) / [x] Appointment(In office/virtual)/ []  [facility]/ [] Home Care/ [] Refused Recommended Disposition /[] [facility]/ []  Follow-up with PCP Additional Notes: wife is angry and Oncologist because she said she was not getting any answers. SHe stated pt was discharged only after getting 1.2 bag of IV fluids. Pt very tearful and thinks her husbands's body is trying to shut down and also very frustrated about his care. Advised pt's wife of the number for pt experience Copied from CRM [id number]. Topic: Clinical - Red Word Triage >> Feb 10, 2024  8:10 AM Allyne Areola wrote: Red Word that prompted transfer to Nurse Triage: Patient's wife Cameron [name] is calling because usband has had to go to the ED twice once last week and once yesterday, due to swelling in the legs,  she states to have found him unconscious twice in the shower. Per Thersia Flax she is not getting straight answers from the ED only that his potassium levels are low. Reason for Disposition  [1] MODERATE leg swelling (e.g., swelling extends up to knees) AND [2] new-onset or worsening  Answer Assessment - Initial Assessment Questions 1. ONSET: "When did the swelling start?" (e.g., minutes, hours, days)     Ankle 2 days ago//last night to the knee  2. LOCATION: "What part of the leg is swollen?"  "Are both legs swollen or just one leg?"     Knee to ankle 3. SEVERITY: "How bad is the swelling?" (e.g., localized; mild, moderate, severe)   - Localized: Small area of swelling localized to one leg.   - MILD pedal edema: Swelling limited to foot and ankle, pitting edema < 1/4 inch (6 mm) deep, rest and elevation eliminate most or all swelling.   - MODERATE edema: Swelling of lower leg to knee,  pitting edema > 1/4 inch (6 mm) deep, rest and elevation only partially reduce swelling.   - SEVERE edema: Swelling extends above knee, facial or hand swelling present.      moderate 4. REDNESS: "Does the swelling look red or infected?"     *No Answer* 5. PAIN: "Is the swelling painful to touch?" If Yes, ask: "How painful is it?"   (Scale 1-10; mild, moderate or severe)     *No Answer* 6. FEVER: "Do you have a fever?" If Yes, ask: "What is it, how was it measured, and when did it start?"      *No Answer* 7. CAUSE: "What do you think is causing the leg swelling?"     unsure 8. MEDICAL HISTORY: "Do you have a history of blood clots (e.g., DVT), cancer, heart failure, kidney disease, or liver failure?"     *No Answer* 10. OTHER SYMPTOMS: "Do you have any other symptoms?" (e.g., chest pain, difficulty breathing)       H/o urinary retention, syncopal episodes  Protocols used: Leg Swelling and Edema-A-AH

## 2024-02-10 NOTE — Telephone Encounter (Signed)
"  Bladder not functioning. Please call me immediately". Returned call / Pt has foley catheter now after ED visit. Cameron [name] frustrated with frequent visits to ED . "I need some answers".  She is speaking rapidly and crying and endorses caregiver stress.  He is going to see PCP today.

## 2024-02-10 NOTE — Assessment & Plan Note (Addendum)
 Did review most recent ED note along with imaging and lab work.  Patient was placed Protonix  40 mg daily in hospital he did have a DuoNeb for 30 days and then titrate down to 20 mg for 30 days and then anticipate coming off.  Discussed that this can cause hypomagnesia and which can result in hypokalemia.  May need to consider supplementation of both electrolytes.  States he was doing 600 mg of magnesium  and encouraged him to stop as it could be causing diarrhea

## 2024-02-10 NOTE — Assessment & Plan Note (Signed)
 Multifactorial.  Patient recently had echocardiogram and being his months that showed a normal EF.  Patient was on HCTZ in the past but then had an episode of dehydration and syncope.  Per patient report currently maintained on 12.5 HCTZ.  We discussed that this will help with the edema but also exacerbate his hypokalemia.  Patient to also elevate legs while at rest and use compression garments to the day.

## 2024-02-10 NOTE — Telephone Encounter (Signed)
Noted. Patient was evaluated in office

## 2024-02-10 NOTE — Assessment & Plan Note (Signed)
 Patient having multiple falls at home now using an ambulation aid.  Will do home health for gait evaluation and strength training.

## 2024-02-10 NOTE — ED Provider Notes (Signed)
 [place] [facility] AT [facility] Provider Note  CSN: [id number] Arrival date & time: 02/09/24 2040  Chief Complaint(s) Leg Swelling  HPI Cameron [name] is a 73 y.o. male presenting with urinary retention.  Feels like he cannot urinate.  Having dribbling urination.  Has had prior catheterization.  Did not have any pain or discomfort, no fevers or chills.  Patient also reports leg swelling.  Has been having leg swelling today, past few days.  Was recently on HCTZ but it was discontinued.  Was recently hospitalized for dehydration.  Has been drinking a lot of fluids.  No pain.  No erythema.  Past Medical History Past Medical History:  Diagnosis Date   Adenomatous colon polyp 05/2009; 11/2014   2010:Tubular adenoma, no high grade dysplasia.[name]  + Hyperplastic polyps. 2016: Tubular adenoma-rpt 5 yrs.   Cancer (HCC)    TURP for prostate cancer   COPD (chronic obstructive pulmonary disease) (HCC)    Coronary artery disease    Difficult intubation    H/O chronic gastritis 05/2009   EGD: no h.pylori,dysplasia,or evidence of malignancy   Hyperlipidemia    lovaza from prior PMD-stopped due to cost   Hypertension    Obesity, Class I, BMI 30.0-34.9 (see actual BMI) 04/23/2014   Tobacco dependence    Ventral hernia    small   Patient Active Problem List   Diagnosis Date Noted   Multiple falls 02/10/2024   Lower extremity edema 02/10/2024   Urinary retention 02/10/2024   Hospital discharge follow-up 02/10/2024   Syncope 02/02/2024   AKI (acute kidney injury) (HCC) 02/02/2024   Port-A-Cath in place 12/28/2023   Primary squamous cell carcinoma of bronchus of left upper lobe (HCC) 11/10/2023   S/P lobectomy of lung 10/03/2023   Lung nodule 08/15/2023   Acute respiratory infection 06/28/2023   Loss of equilibrium 11/25/2022   Insomnia 11/25/2022   History of polycythemia 11/25/2022   Benign prostatic hyperplasia 07/08/2022   Acute cough 06/23/2022   Upper  respiratory tract infection 06/23/2022   Preventative health care 05/04/2022   Abnormal hemoglobin (Hgb) (HCC) 04/08/2020   Need for hepatitis C screening test 04/26/2018   Centrilobular emphysema (HCC) 04/20/2018   Polycythemia 04/20/2018   CAD (coronary artery disease) 03/31/2018   Visit for preventive health examination 02/27/2018   Overweight (BMI 25.0-29.9) 04/23/2014   Tobacco dependence 12/29/2012   Health maintenance examination 03/13/2012   Hyperlipemia, mixed 03/13/2012   HTN (hypertension), benign 02/11/2012   Home Medication(s) Prior to Admission medications   Medication Sig Start Date End Date Taking? Authorizing Provider  cefdinir (OMNICEF) 300 MG capsule Take 1 capsule (300 mg total) by mouth 2 (two) times daily for 7 days. 02/09/24 02/17/24 Yes Mordecai Applebaum, MD  acetaminophen  (TYLENOL ) 500 MG tablet Take 1-2 tablets (500-1,000 mg total) by mouth every 6 (six) hours as needed. 10/06/23   Barrett, Erin R, PA-C  amiodarone  (PACERONE ) 200 MG tablet Take 1 tablet (200 mg total) by mouth 2 (two) times daily for 10 days, then decrease to 1 tablet (200 mg) daily. Patient not taking: Reported on 02/02/2024 10/06/23 11/25/23  Barrett, Malachy Scripture, PA-C  Ascorbic Acid (VITAMIN C PO) Take 1 tablet by mouth in the morning. Patient not taking: Reported on 02/02/2024    [provider]  atorvastatin  (LIPITOR) 20 MG tablet Take 1 tablet (20 mg total) by mouth daily. 12/21/23   Dorothe Gaster, NP  dexamethasone  (DECADRON ) 4 MG tablet Take 2 tabs (8 mg) twice daily  starting the day before docetaxel , then daily x 3 days after cisplatin .Take with food. 11/10/23   Marlene Simas, MD  dorzolamide  (TRUSOPT ) 2 % ophthalmic solution Administer into right eye twice a day. 02/02/24     fluticasone  (FLONASE ) 50 MCG/ACT nasal spray Place 1 spray into both nostrils daily.    [provider]  fluticasone -salmeterol (ADVAIR  DISKUS) 250-50 MCG/ACT AEPB Inhale 1 puff into the lungs in the morning and  at bedtime. 12/22/23   Dorothe Gaster, NP  gabapentin  (NEURONTIN ) 300 MG capsule Take 1 capsule (300 mg total) by mouth 2 (two) times daily. 10/06/23 10/05/24  Barrett, Malachy Scripture, PA-C  hydrocortisone  ointment 0.5 % Apply 1 Application topically 2 (two) times daily. 12/01/23   Walisiewicz, Kaitlyn E, PA-C  lidocaine -prilocaine  (EMLA ) cream Apply to the Port-A-Cath site 30-60 minutes before chemotherapy 12/14/23   Marlene Simas, MD  metoCLOPramide (REGLAN) 5 MG tablet Take 1 tablet (5 mg total) by mouth every 8 (eight) hours as needed for nausea or vomiting. 02/05/24   Ozell Blunt, MD  ondansetron  (ZOFRAN ) 8 MG tablet Take 1 tablet (8 mg total) by mouth every 8 (eight) hours as needed for nausea or vomiting. Begin on the third day after cisplatin  chemotherapy. 11/10/23   Marlene Simas, MD  oxyCODONE  (OXY IR/ROXICODONE ) 5 MG immediate release tablet Take 1-2 tablets (5-10 mg total) by mouth every 4 (four) hours as needed for moderate pain (pain score 4-6). 10/06/23   Barrett, Malachy Scripture, PA-C  pantoprazole  (PROTONIX ) 40 MG tablet Take 1 tablet (40 mg total) by mouth daily at 12 noon. 02/06/24   Ozell Blunt, MD  valACYclovir (VALTREX) 1000 MG tablet Take 1,000 mg by mouth 2 (two) times daily as needed (fever blisters/outbreaks). 04/05/23   [provider]  chlorproMAZINE  (THORAZINE ) 25 MG tablet Take 1 tablet (25 mg total) by mouth 3 (three) times daily as needed for hiccoughs. 01/11/24 01/30/24  Marlene Simas, MD                                                                                                                                    Past Surgical History Past Surgical History:  Procedure Laterality Date   APPENDECTOMY  09/28/1971   BIOPSY  08/15/2023   Procedure: BIOPSY;  Surgeon: Prudy Brownie, DO;  Location: [facility];  Service: Pulmonary;;   BLADDER DIVERTICULECTOMY N/A 09/30/2022   Procedure: BLADDER DIVERTICULECTOMY;  Surgeon: Florencio Hunting, MD;  Location: [facility];  Service: Urology;   Laterality: N/A;   CARPAL TUNNEL RELEASE Left    CHOLECYSTECTOMY N/A 07/06/2018   Procedure: LAPAROSCOPIC CHOLECYSTECTOMY WITH INTRAOPERATIVE CHOLANGIOGRAM;  Surgeon: Juanita Norlander, MD;  Location: [facility] OR;  Service: General;  Laterality: N/A;   COLONOSCOPY  09/27/2008   [facility]   CYSTOSCOPY N/A 09/30/2022   Procedure: Ardith Bedford;  Surgeon: Florencio Hunting, MD;  Location: [facility];  Service: Urology;  Laterality: N/A;   EYE SURGERY  2018   Cataracts removed only   FINGER SURGERY Left 12/2021   Left middle finger, laceration   HEMOSTASIS CONTROL  08/15/2023   Procedure: HEMOSTASIS CONTROL;  Surgeon: Prudy Brownie, DO;  Location: [facility];  Service: Pulmonary;;   INTERCOSTAL NERVE BLOCK Left 10/03/2023   Procedure: INTERCOSTAL NERVE BLOCK;  Surgeon: Zelphia Higashi, MD;  Location: [facility] OR;  Service: Thoracic;  Laterality: Left;   IR IMAGING GUIDED PORT INSERTION  12/08/2023   LYMPH NODE BIOPSY Left 10/03/2023   Procedure: LYMPH NODE BIOPSY;  Surgeon: Zelphia Higashi, MD;  Location: [facility] OR;  Service: Thoracic;  Laterality: Left;   ROBOT ASSISTED LAPAROSCOPIC RADICAL PROSTATECTOMY N/A 09/30/2022   Procedure: XI ROBOTIC ASSISTED LAPAROSCOPIC RADICAL PROSTATECTOMY LEVEL 3;  Surgeon: Florencio Hunting, MD;  Location: [facility];  Service: Urology;  Laterality: N/A;  270 MINUTES NEEDED FOR CASE   STRABISMUS SURGERY  age 46 or 9   TRANSURETHRAL RESECTION OF PROSTATE N/A 07/08/2022   Procedure: TRANSURETHRAL RESECTION OF THE PROSTATE (TURP)/ CYSTOSCOPY;  Surgeon: Florencio Hunting, MD;  Location: [facility];  Service: Urology;  Laterality: N/A;   VIDEO BRONCHOSCOPY N/A 08/15/2023   Procedure: VIDEO BRONCHOSCOPY WITHOUT FLUORO;  Surgeon: Prudy Brownie, DO;  Location: [facility];  Service: Pulmonary;  Laterality: N/A;   Family History Family History  Problem Relation Age of Onset   Diabetes Mother    Heart disease Mother    COPD Father    Hypertension Father    Hyperlipidemia Father    Heart  attack Father    Dementia Father    Alcohol abuse Maternal Grandfather     Social History Social History   Tobacco Use   Smoking status: Every Day    Current packs/day: 1.00    Average packs/day: 1 pack/day for 40.0 years (40.0 ttl pk-yrs)    Types: Cigarettes   Smokeless tobacco: Never   Tobacco comments:    Maybe 1-1.5 cigarettes per day since 09/09/23  Vaping Use   Vaping status: Never Used  Substance Use Topics   Alcohol use: Not Currently   Drug use: No   Allergies Compazine  [prochlorperazine ]  Review of Systems Review of Systems  All other systems reviewed and are negative.   Physical Exam Vital Signs  I have reviewed the triage vital signs BP (!) 152/87   Pulse 88   Temp 98.2 F (36.8 C) (Oral)   Resp 16   Ht 5\' 11"  (1.803 m)   Wt 93 kg   SpO2 100%   BMI 28.59 kg/m  Physical Exam Vitals and nursing note reviewed.  Constitutional:      General: He is not in acute distress.    Appearance: Normal appearance.  HENT:     Mouth/Throat:     Mouth: Mucous membranes are moist.  Eyes:     Conjunctiva/sclera: Conjunctivae normal.  Neck:     Vascular: No hepatojugular reflux or JVD.  Cardiovascular:     Rate and Rhythm: Normal rate and regular rhythm.  Pulmonary:     Effort: Pulmonary effort is normal. No respiratory distress.     Breath sounds: Normal breath sounds.  Abdominal:     General: Abdomen is flat.     Palpations: Abdomen is soft.     Tenderness: There is no abdominal tenderness.  Musculoskeletal:     Right lower leg: Edema present.     Left lower leg: Edema present.     Comments: No LE erythema, ttp, warmth  Skin:  General: Skin is warm and dry.     Capillary Refill: Capillary refill takes less than 2 seconds.  Neurological:     Mental Status: He is alert and oriented to person, place, and time. Mental status is at baseline.  Psychiatric:        Mood and Affect: Mood normal.        Behavior: Behavior normal.     ED Results and  Treatments Labs (all labs ordered are listed, but only abnormal results are displayed) Labs Reviewed  COMPREHENSIVE METABOLIC PANEL WITH GFR - Abnormal; Notable for the following components:      Result Value   Potassium 3.2 (*)    Glucose, Bld 118 (*)    Calcium  7.9 (*)    Total Protein 5.1 (*)    Albumin  2.6 (*)    All other components within normal limits  CBC WITH DIFFERENTIAL/PLATELET - Abnormal; Notable for the following components:   WBC 15.5 (*)    RBC 2.91 (*)    Hemoglobin 9.5 (*)    HCT 28.3 (*)    RDW 17.1 (*)    Neutro Abs 12.9 (*)    Abs Immature Granulocytes 0.31 (*)    All other components within normal limits  BRAIN NATRIURETIC PEPTIDE - Abnormal; Notable for the following components:   B Natriuretic Peptide 224.8 (*)    All other components within normal limits  URINALYSIS, W/ REFLEX TO CULTURE (INFECTION SUSPECTED) - Abnormal; Notable for the following components:   APPearance HAZY (*)    Hgb urine dipstick SMALL (*)    Leukocytes,Ua MODERATE (*)    All other components within normal limits  URINE CULTURE                                                                                                                          Radiology DG Chest Portable 1 View Result Date: 02/09/2024 CLINICAL DATA:  weakness EXAM: PORTABLE CHEST - 1 VIEW COMPARISON:  02/02/2024. FINDINGS: Cardiac silhouette is unremarkable. No pneumothorax or pleural effusion. The lungs are clear. Aorta is calcified. The visualized skeletal structures are unremarkable. Right-sided Port-A-Cath tip overlies distal SVC. IMPRESSION: No acute cardiopulmonary process. Electronically Signed   By: Sydell Eva M.D.   On: 02/09/2024 22:18    Pertinent labs & imaging results that were available during my care of the patient were reviewed by me and considered in my medical decision making (see MDM for details).  Medications Ordered in ED Medications  cefTRIAXone  (ROCEPHIN ) 1 g in sodium chloride  0.9  % 100 mL IVPB (0 g Intravenous Stopped 02/10/24 0107)  potassium chloride  (KLOR-CON ) packet 40 mEq (40 mEq Oral Given 02/10/24 0006)  heparin  lock flush 100 unit/mL (500 Units Intracatheter Given 02/10/24 0107)  Procedures Procedures  (including critical care time)  Medical Decision Making / ED Course   MDM:  73 y/o with urinary retention, leg swelling.   Patient overall well appearing. Bladder scan was elevated so foley catheter placed by nursing with relief. UA with >50 WBC, WBC clumps, no bacteria. Given acute retention and UA,  will cover with antibiotics, Received ceftriaxone , but culture sent as well. Has required foley catheter previously and has a urologist. Advised follow up. Considered other causes of urinary retention such as medication effect or spinal cord process but history and physical not consistent with this.   Patient also with mild b/l LE edema. No tenderness, erythema, warmth. Low concern for DVT with symmetric edema. No signs of SSTI. Had recent hospitalization with reassuring echocardiogram. Was dehydrated at time and hydrochlorothiazide  was discontinued. Suspect this is contributing. Labs also with mild hypoalbuminemia which may be contributing. Advised PMD follow up, resume hydrochlorothiazide , elevate legs, but be careful to avoid repeat episode of dehydration/syncope.   Will discharge patient to home. All questions answered. Patient comfortable with plan of discharge. Return precautions discussed with patient and specified on the after visit summary.        Additional history obtained: -Additional history obtained from spouse -External records from outside source obtained and reviewed including: Chart review including previous notes, labs, imaging, consultation notes including prior hospitalization notes    Lab Tests: -I ordered,  reviewed, and interpreted labs.   The pertinent results include:   Labs Reviewed  COMPREHENSIVE METABOLIC PANEL WITH GFR - Abnormal; Notable for the following components:      Result Value   Potassium 3.2 (*)    Glucose, Bld 118 (*)    Calcium  7.9 (*)    Total Protein 5.1 (*)    Albumin  2.6 (*)    All other components within normal limits  CBC WITH DIFFERENTIAL/PLATELET - Abnormal; Notable for the following components:   WBC 15.5 (*)    RBC 2.91 (*)    Hemoglobin 9.5 (*)    HCT 28.3 (*)    RDW 17.1 (*)    Neutro Abs 12.9 (*)    Abs Immature Granulocytes 0.31 (*)    All other components within normal limits  BRAIN NATRIURETIC PEPTIDE - Abnormal; Notable for the following components:   B Natriuretic Peptide 224.8 (*)    All other components within normal limits  URINALYSIS, W/ REFLEX TO CULTURE (INFECTION SUSPECTED) - Abnormal; Notable for the following components:   APPearance HAZY (*)    Hgb urine dipstick SMALL (*)    Leukocytes,Ua MODERATE (*)    All other components within normal limits  URINE CULTURE    Notable for mild hypokalemia, repleted. Mild hypoalbuminema. Nonspecific mild BNP elevation     Imaging Studies ordered: I ordered imaging studies including CXR On my interpretation imaging demonstrates no acute process I independently visualized and interpreted imaging. I agree with the radiologist interpretation   Medicines ordered and prescription drug management: Meds ordered this encounter  Medications   DISCONTD: sodium chloride  0.9 % bolus 1,000 mL   cefTRIAXone  (ROCEPHIN ) 1 g in sodium chloride  0.9 % 100 mL IVPB    Antibiotic Indication::   UTI   potassium chloride  (KLOR-CON ) packet 40 mEq   cefdinir (OMNICEF) 300 MG capsule    Sig: Take 1 capsule (300 mg total) by mouth 2 (two) times daily for 7 days.    Dispense:  14 capsule    Refill:  0   heparin  lock flush 100  unit/mL    -I have reviewed the patients home medicines and have made adjustments as  needed   Reevaluation: After the interventions noted above, I reevaluated the patient and found that their symptoms have improved  Co morbidities that complicate the patient evaluation  Past Medical History:  Diagnosis Date   Adenomatous colon polyp 05/2009; 11/2014   2010:Tubular adenoma, no high grade dysplasia.[name]  + Hyperplastic polyps. 2016: Tubular adenoma-rpt 5 yrs.   Cancer (HCC)    TURP for prostate cancer   COPD (chronic obstructive pulmonary disease) (HCC)    Coronary artery disease    Difficult intubation    H/O chronic gastritis 05/2009   EGD: no h.pylori,dysplasia,or evidence of malignancy   Hyperlipidemia    lovaza from prior PMD-stopped due to cost   Hypertension    Obesity, Class I, BMI 30.0-34.9 (see actual BMI) 04/23/2014   Tobacco dependence    Ventral hernia    small      Dispostion: Disposition decision including need for hospitalization was considered, and patient discharged from emergency department.    Final Clinical Impression(s) / ED Diagnoses Final diagnoses:  Urinary retention  Leg swelling     This chart was dictated using voice recognition software.  Despite best efforts to proofread,  errors can occur which can change the documentation meaning.    Mordecai Applebaum, MD 02/10/24 [phone]

## 2024-02-12 DIAGNOSIS — B965 Pseudomonas (aeruginosa) (mallei) (pseudomallei) as the cause of diseases classified elsewhere: Secondary | ICD-10-CM | POA: Diagnosis not present

## 2024-02-12 DIAGNOSIS — Z7951 Long term (current) use of inhaled steroids: Secondary | ICD-10-CM | POA: Diagnosis not present

## 2024-02-12 DIAGNOSIS — R600 Localized edema: Secondary | ICD-10-CM | POA: Diagnosis not present

## 2024-02-12 DIAGNOSIS — Z466 Encounter for fitting and adjustment of urinary device: Secondary | ICD-10-CM | POA: Diagnosis not present

## 2024-02-12 DIAGNOSIS — Z9181 History of falling: Secondary | ICD-10-CM | POA: Diagnosis not present

## 2024-02-12 DIAGNOSIS — Z85118 Personal history of other malignant neoplasm of bronchus and lung: Secondary | ICD-10-CM | POA: Diagnosis not present

## 2024-02-12 DIAGNOSIS — Z792 Long term (current) use of antibiotics: Secondary | ICD-10-CM | POA: Diagnosis not present

## 2024-02-12 DIAGNOSIS — R296 Repeated falls: Secondary | ICD-10-CM | POA: Diagnosis not present

## 2024-02-12 DIAGNOSIS — I10 Essential (primary) hypertension: Secondary | ICD-10-CM | POA: Diagnosis not present

## 2024-02-12 DIAGNOSIS — N390 Urinary tract infection, site not specified: Secondary | ICD-10-CM | POA: Diagnosis not present

## 2024-02-12 DIAGNOSIS — R339 Retention of urine, unspecified: Secondary | ICD-10-CM | POA: Diagnosis not present

## 2024-02-12 LAB — URINE CULTURE: Culture: >=100000 — AB

## 2024-02-13 ENCOUNTER — Other Ambulatory Visit (HOSPITAL_COMMUNITY): Payer: Self-pay

## 2024-02-13 ENCOUNTER — Inpatient Hospital Stay

## 2024-02-13 ENCOUNTER — Encounter: Payer: Self-pay | Admitting: Internal Medicine

## 2024-02-13 ENCOUNTER — Other Ambulatory Visit: Payer: Self-pay

## 2024-02-13 ENCOUNTER — Telehealth: Payer: Self-pay

## 2024-02-13 ENCOUNTER — Ambulatory Visit: Payer: Self-pay | Admitting: Nurse Practitioner

## 2024-02-13 ENCOUNTER — Telehealth: Payer: Self-pay | Admitting: Medical Oncology

## 2024-02-13 ENCOUNTER — Telehealth (HOSPITAL_BASED_OUTPATIENT_CLINIC_OR_DEPARTMENT_OTHER): Payer: Self-pay | Admitting: *Deleted

## 2024-02-13 DIAGNOSIS — E876 Hypokalemia: Secondary | ICD-10-CM

## 2024-02-13 DIAGNOSIS — C3412 Malignant neoplasm of upper lobe, left bronchus or lung: Secondary | ICD-10-CM

## 2024-02-13 DIAGNOSIS — Z95828 Presence of other vascular implants and grafts: Secondary | ICD-10-CM

## 2024-02-13 LAB — CBC WITH DIFFERENTIAL (CANCER CENTER ONLY)
Abs Immature Granulocytes: 0.13 10*3/uL — ABNORMAL HIGH (ref 0.00–0.07)
Basophils Absolute: 0.1 10*3/uL (ref 0.0–0.1)
Basophils Relative: 1 %
Eosinophils Absolute: 0 10*3/uL (ref 0.0–0.5)
Eosinophils Relative: 0 %
HCT: 29.8 % — ABNORMAL LOW (ref 39.0–52.0)
Hemoglobin: 10 g/dL — ABNORMAL LOW (ref 13.0–17.0)
Immature Granulocytes: 1 %
Lymphocytes Relative: 16 %
Lymphs Abs: 1.5 10*3/uL (ref 0.7–4.0)
MCH: 32.3 pg (ref 26.0–34.0)
MCHC: 33.6 g/dL (ref 30.0–36.0)
MCV: 96.1 fL (ref 80.0–100.0)
Monocytes Absolute: 0.6 10*3/uL (ref 0.1–1.0)
Monocytes Relative: 6 %
Neutro Abs: 7 10*3/uL (ref 1.7–7.7)
Neutrophils Relative %: 76 %
Platelet Count: 338 10*3/uL (ref 150–400)
RBC: 3.1 MIL/uL — ABNORMAL LOW (ref 4.22–5.81)
RDW: 17.2 % — ABNORMAL HIGH (ref 11.5–15.5)
WBC Count: 9.3 10*3/uL (ref 4.0–10.5)
nRBC: 0 % (ref 0.0–0.2)

## 2024-02-13 LAB — CMP (CANCER CENTER ONLY)
ALT: 11 U/L (ref 0–44)
AST: 14 U/L — ABNORMAL LOW (ref 15–41)
Albumin: 3.1 g/dL — ABNORMAL LOW (ref 3.5–5.0)
Alkaline Phosphatase: 79 U/L (ref 38–126)
Anion gap: 5 (ref 5–15)
BUN: 7 mg/dL — ABNORMAL LOW (ref 8–23)
CO2: 34 mmol/L — ABNORMAL HIGH (ref 22–32)
Calcium: 8.3 mg/dL — ABNORMAL LOW (ref 8.9–10.3)
Chloride: 100 mmol/L (ref 98–111)
Creatinine: 1.08 mg/dL (ref 0.61–1.24)
GFR, Estimated: >60 mL/min (ref >60)
Glucose, Bld: 117 mg/dL — ABNORMAL HIGH (ref 70–99)
Potassium: 3.2 mmol/L — ABNORMAL LOW (ref 3.5–5.1)
Sodium: 139 mmol/L (ref 135–145)
Total Bilirubin: 0.3 mg/dL (ref 0.0–1.2)
Total Protein: 5.2 g/dL — ABNORMAL LOW (ref 6.5–8.1)

## 2024-02-13 LAB — MAGNESIUM: Magnesium: 1.6 mg/dL — ABNORMAL LOW (ref 1.7–2.4)

## 2024-02-13 MED ORDER — POTASSIUM CHLORIDE CRYS ER 20 MEQ PO TBCR
20.0000 meq | EXTENDED_RELEASE_TABLET | Freq: Every day | ORAL | 0 refills | Status: DC
  Filled 2024-02-13: qty 30, 30d supply, fill #0

## 2024-02-13 MED ORDER — HEPARIN SOD (PORK) LOCK FLUSH 100 UNIT/ML IV SOLN
500.0000 [IU] | Freq: Once | INTRAVENOUS | Status: AC
  Administered 2024-02-13: 500 [IU]

## 2024-02-13 MED ORDER — MAGNESIUM OXIDE -MG SUPPLEMENT 400 (240 MG) MG PO TABS
400.0000 mg | ORAL_TABLET | Freq: Three times a day (TID) | ORAL | 0 refills | Status: DC
  Filled 2024-02-13: qty 90, 30d supply, fill #0

## 2024-02-13 MED ORDER — SODIUM CHLORIDE 0.9% FLUSH
10.0000 mL | Freq: Once | INTRAVENOUS | Status: AC
Start: 2024-02-13 — End: 2024-02-13
  Administered 2024-02-13: 10 mL

## 2024-02-13 NOTE — Telephone Encounter (Signed)
 Labs will be done at appointment next week per patient and his wife, no need for lab appointment here

## 2024-02-13 NOTE — Telephone Encounter (Signed)
 Copied from CRM [id number]. Topic: General - Other >> Feb 13, 2024  4:22 PM Adonis Hoot wrote: Reason for CRM: Patient called in stating that he will have labs done at the cancer center Thursday of this week.Do Matt still want him to come in our office to have 1 week labs done or he will just look at labs from cancer center?

## 2024-02-13 NOTE — Telephone Encounter (Signed)
 Cancer center is fine

## 2024-02-13 NOTE — Telephone Encounter (Signed)
 Spoke with patient, he is picking up potassium pills from pharmacy now.

## 2024-02-13 NOTE — Telephone Encounter (Signed)
 Spoke with patient and his wife regarding recent lab results. Per Dr. Marguerita Shih, potassium and magnesium  levels are low. Instructed patient to increase potassium-rich foods in his diet. Patient's PCP has sent in a prescription for potassium chloride  supplement today. Additionally, patient is advised to take 400 mg of magnesium  oxide three times daily, which has also been sent to the pharmacy.  Patient's wife inquired about rechecking the labs. I informed her that Dr. Marguerita Shih recommended against repeating the labs so soon as the patient is just coming off treatment. The labs can be redrawn prior to the upcoming CT scan appointment.  A port flush and lab appointment have been scheduled for 8:45 AM on 02/21/24, just prior to the CT scan at 9:30 AM.  Patient and wife voiced understanding.

## 2024-02-13 NOTE — Telephone Encounter (Signed)
 Post ED Visit - Positive Culture Follow-up: Successful Patient Follow-Up  Culture assessed and recommendations reviewed by:  []  [place], Pharm.D. []  Skeet Duke, Pharm.D., BCPS AQ-ID []  Leslee Rase, Pharm.D., BCPS []  Garland Junk, [address].D., BCPS []  [place], [place].D., BCPS, AAHIVP []  Alcide Aly, Pharm.D., BCPS, AAHIVP []  Jerri Morale, PharmD, BCPS []  Graham Laws, PharmD, BCPS []  Cleda Curly, PharmD, BCPS [x] Garland Junk, PharmD  Positive urine culture  []  Patient discharged without antimicrobial prescription and treatment is now indicated [x]  Organism is resistant to prescribed ED discharge antimicrobial []  Patient with positive blood cultures  Changes discussed with ED provider: Judie Noun, PA Plan: Stop Cefdinir .  No additional treatment. Follow up with urology Spoke to pt and pt understood instructions  Contacted patient, date 02/13/24, time 1030   Cameron [name] 02/13/2024, 10:22 AM

## 2024-02-13 NOTE — Telephone Encounter (Signed)
 Patient notified

## 2024-02-13 NOTE — Telephone Encounter (Addendum)
 Patient reports feeling better, with reduced swelling and a good appetite. He is currently eating sweet potatoes, broccoli, watermelon, and drinking Gatorade and other fluids.  Thersia Flax informed this nurse that [facility] nursing and PT will be visiting the patient's home this week for evaluation and treatment.  Thersia Flax expressed concern about the CMP results today, specifically low potassium (K+) and low magnesium  (Mg) levels. Patient is not currently taking any supplements for these deficiencies.

## 2024-02-13 NOTE — Progress Notes (Signed)
 ED Antimicrobial Stewardship Positive Culture Follow Up   Cameron [name] is an 73 y.o. male who presented to [facility] on 02/09/2024 with a chief complaint of  Chief Complaint  Patient presents with   Leg Swelling    Recent Results (from the past 720 hours)  Urine Culture     Status: Abnormal   Collection Time: 02/09/24 10:29 PM   Specimen: Urine, Random  Result Value Ref Range Status   Specimen Description   Final    URINE, RANDOM Performed at [facility], [address], [place], [place] [address]    Special Requests   Final    NONE Reflexed from [id number] Performed at [facility], [address] [place], [place] [address]    Culture >=100,000 COLONIES/mL STAPHYLOCOCCUS EPIDERMIDIS (A)  Final   Report Status 02/12/2024 FINAL  Final   Organism ID, Bacteria STAPHYLOCOCCUS EPIDERMIDIS (A)  Final      Susceptibility   Staphylococcus epidermidis - MIC*    CIPROFLOXACIN  <=0.5 SENSITIVE Sensitive     GENTAMICIN  <=0.5 SENSITIVE Sensitive     NITROFURANTOIN <=16 SENSITIVE Sensitive     OXACILLIN >=4 RESISTANT Resistant     TETRACYCLINE >=16 RESISTANT Resistant     VANCOMYCIN 1 SENSITIVE Sensitive     TRIMETH /SULFA  <=10 SENSITIVE Sensitive     RIFAMPIN <=0.5 SENSITIVE Sensitive     Inducible Clindamycin NEGATIVE Sensitive     * >=100,000 COLONIES/mL STAPHYLOCOCCUS EPIDERMIDIS    [x]  Treated with cefdinir , organism resistant to prescribed antimicrobial.  72  YOM presented to ED with urinary rentention. No complaints of pain, discomfort, fever, chills. A foley catheter was placed and patient was able to void. Patient was advised to follow up with urology upon discharge. Cefdinir  was prescribed, however urine culture sensitivities show resistance to oxacillin, so cefdinir  is not covering the isolated pathogen.   Call the patient and stop cefdinir . No additional antibiotics needed. Follow up with urology.   ED Provider: Judie Noun, PA-C  Winnie Haver, PharmD Candidate [phone] [employer] [employer] [facility] [employer] 02/13/2024 9:21 AM

## 2024-02-13 NOTE — Telephone Encounter (Signed)
 Patient needs non fasting lab appointment in 1 week

## 2024-02-14 ENCOUNTER — Encounter: Payer: HMO | Admitting: Nurse Practitioner

## 2024-02-15 ENCOUNTER — Other Ambulatory Visit (HOSPITAL_COMMUNITY): Payer: Self-pay

## 2024-02-15 ENCOUNTER — Inpatient Hospital Stay

## 2024-02-15 ENCOUNTER — Other Ambulatory Visit: Payer: Self-pay | Admitting: Nurse Practitioner

## 2024-02-15 MED ORDER — GABAPENTIN 300 MG PO CAPS
300.0000 mg | ORAL_CAPSULE | Freq: Two times a day (BID) | ORAL | 2 refills | Status: DC
  Filled 2024-02-15: qty 90, 45d supply, fill #0
  Filled 2024-04-07: qty 90, 45d supply, fill #1
  Filled 2024-06-13: qty 90, 45d supply, fill #2

## 2024-02-15 MED ORDER — VALACYCLOVIR HCL 1 G PO TABS
1000.0000 mg | ORAL_TABLET | Freq: Two times a day (BID) | ORAL | 2 refills | Status: AC
  Filled 2024-02-15: qty 18, 9d supply, fill #0

## 2024-02-15 NOTE — Telephone Encounter (Signed)
 Last read by Guadalupe Lee at 4:06PM on 02/13/2024.  Last read by Andres Bangs at 9:02AM on 02/13/2024.

## 2024-02-16 ENCOUNTER — Other Ambulatory Visit

## 2024-02-16 ENCOUNTER — Other Ambulatory Visit (HOSPITAL_COMMUNITY): Payer: Self-pay

## 2024-02-17 ENCOUNTER — Telehealth: Payer: Self-pay

## 2024-02-17 ENCOUNTER — Encounter (HOSPITAL_COMMUNITY): Payer: Self-pay

## 2024-02-17 ENCOUNTER — Emergency Department (HOSPITAL_COMMUNITY)
Admission: EM | Admit: 2024-02-17 | Discharge: 2024-02-17 | Disposition: A | Discharge status: Home / Self Care | Attending: Emergency Medicine | Admitting: Emergency Medicine

## 2024-02-17 ENCOUNTER — Emergency Department (HOSPITAL_COMMUNITY)

## 2024-02-17 ENCOUNTER — Other Ambulatory Visit: Payer: Self-pay

## 2024-02-17 ENCOUNTER — Other Ambulatory Visit: Payer: Self-pay | Admitting: Physician Assistant

## 2024-02-17 DIAGNOSIS — I2510 Atherosclerotic heart disease of native coronary artery without angina pectoris: Secondary | ICD-10-CM | POA: Diagnosis not present

## 2024-02-17 DIAGNOSIS — I959 Hypotension, unspecified: Secondary | ICD-10-CM | POA: Diagnosis not present

## 2024-02-17 DIAGNOSIS — R531 Weakness: Secondary | ICD-10-CM | POA: Diagnosis not present

## 2024-02-17 DIAGNOSIS — Z0389 Encounter for observation for other suspected diseases and conditions ruled out: Secondary | ICD-10-CM | POA: Diagnosis not present

## 2024-02-17 DIAGNOSIS — R55 Syncope and collapse: Secondary | ICD-10-CM | POA: Diagnosis not present

## 2024-02-17 DIAGNOSIS — R5383 Other fatigue: Secondary | ICD-10-CM | POA: Insufficient documentation

## 2024-02-17 DIAGNOSIS — R509 Fever, unspecified: Secondary | ICD-10-CM | POA: Insufficient documentation

## 2024-02-17 DIAGNOSIS — T83518A Infection and inflammatory reaction due to other urinary catheter, initial encounter: Secondary | ICD-10-CM | POA: Diagnosis not present

## 2024-02-17 DIAGNOSIS — I700 Atherosclerosis of aorta: Secondary | ICD-10-CM | POA: Diagnosis not present

## 2024-02-17 DIAGNOSIS — A419 Sepsis, unspecified organism: Secondary | ICD-10-CM | POA: Diagnosis not present

## 2024-02-17 DIAGNOSIS — D72829 Elevated white blood cell count, unspecified: Secondary | ICD-10-CM | POA: Insufficient documentation

## 2024-02-17 DIAGNOSIS — J439 Emphysema, unspecified: Secondary | ICD-10-CM | POA: Diagnosis not present

## 2024-02-17 DIAGNOSIS — N281 Cyst of kidney, acquired: Secondary | ICD-10-CM | POA: Diagnosis not present

## 2024-02-17 DIAGNOSIS — I7143 Infrarenal abdominal aortic aneurysm, without rupture: Secondary | ICD-10-CM | POA: Diagnosis not present

## 2024-02-17 DIAGNOSIS — R9082 White matter disease, unspecified: Secondary | ICD-10-CM | POA: Diagnosis not present

## 2024-02-17 DIAGNOSIS — Z4682 Encounter for fitting and adjustment of non-vascular catheter: Secondary | ICD-10-CM | POA: Diagnosis not present

## 2024-02-17 DIAGNOSIS — I10 Essential (primary) hypertension: Secondary | ICD-10-CM | POA: Diagnosis not present

## 2024-02-17 LAB — CBC WITH DIFFERENTIAL/PLATELET
Abs Immature Granulocytes: 0.13 10*3/uL — ABNORMAL HIGH (ref 0.00–0.07)
Basophils Absolute: 0 10*3/uL (ref 0.0–0.1)
Basophils Relative: 0 %
Eosinophils Absolute: 0 10*3/uL (ref 0.0–0.5)
Eosinophils Relative: 0 %
HCT: 29.3 % — ABNORMAL LOW (ref 39.0–52.0)
Hemoglobin: 9.5 g/dL — ABNORMAL LOW (ref 13.0–17.0)
Immature Granulocytes: 1 %
Lymphocytes Relative: 6 %
Lymphs Abs: 1 10*3/uL (ref 0.7–4.0)
MCH: 32.5 pg (ref 26.0–34.0)
MCHC: 32.4 g/dL (ref 30.0–36.0)
MCV: 100.3 fL — ABNORMAL HIGH (ref 80.0–100.0)
Monocytes Absolute: 1.2 10*3/uL — ABNORMAL HIGH (ref 0.1–1.0)
Monocytes Relative: 7 %
Neutro Abs: 14.3 10*3/uL — ABNORMAL HIGH (ref 1.7–7.7)
Neutrophils Relative %: 86 %
Platelets: 253 10*3/uL (ref 150–400)
RBC: 2.92 MIL/uL — ABNORMAL LOW (ref 4.22–5.81)
RDW: 18.2 % — ABNORMAL HIGH (ref 11.5–15.5)
WBC: 16.7 10*3/uL — ABNORMAL HIGH (ref 4.0–10.5)
nRBC: 0 % (ref 0.0–0.2)

## 2024-02-17 LAB — COMPREHENSIVE METABOLIC PANEL WITH GFR
ALT: 10 U/L (ref 0–44)
AST: 21 U/L (ref 15–41)
Albumin: 2.5 g/dL — ABNORMAL LOW (ref 3.5–5.0)
Alkaline Phosphatase: 73 U/L (ref 38–126)
Anion gap: 9 (ref 5–15)
BUN: 12 mg/dL (ref 8–23)
CO2: 27 mmol/L (ref 22–32)
Calcium: 8.1 mg/dL — ABNORMAL LOW (ref 8.9–10.3)
Chloride: 97 mmol/L — ABNORMAL LOW (ref 98–111)
Creatinine, Ser: 1.2 mg/dL (ref 0.61–1.24)
GFR, Estimated: >60 mL/min (ref >60)
Glucose, Bld: 154 mg/dL — ABNORMAL HIGH (ref 70–99)
Potassium: 4.1 mmol/L (ref 3.5–5.1)
Sodium: 133 mmol/L — ABNORMAL LOW (ref 135–145)
Total Bilirubin: 0.3 mg/dL (ref 0.0–1.2)
Total Protein: 5.2 g/dL — ABNORMAL LOW (ref 6.5–8.1)

## 2024-02-17 LAB — URINALYSIS, W/ REFLEX TO CULTURE (INFECTION SUSPECTED)
Bacteria, UA: NONE SEEN
Bilirubin Urine: NEGATIVE
Glucose, UA: NEGATIVE mg/dL
Hgb urine dipstick: NEGATIVE
Ketones, ur: NEGATIVE mg/dL
Nitrite: NEGATIVE
Protein, ur: NEGATIVE mg/dL
Specific Gravity, Urine: 1.009 (ref 1.005–1.030)
pH: 8 (ref 5.0–8.0)

## 2024-02-17 LAB — I-STAT CG4 LACTIC ACID, ED
Lactic Acid, Venous: 2 mmol/L (ref 0.5–1.9)
Lactic Acid, Venous: 1.1 mmol/L (ref 0.5–1.9)

## 2024-02-17 MED ORDER — IBUPROFEN 800 MG PO TABS: 800.0000 mg | ORAL_TABLET | Freq: Three times a day (TID) | ORAL | 0 refills | Status: DC

## 2024-02-17 MED ORDER — AMOXICILLIN-POT CLAVULANATE 875-125 MG PO TABS: 1.0000 | ORAL_TABLET | Freq: Two times a day (BID) | ORAL | 0 refills | Status: DC

## 2024-02-17 MED ORDER — KETOROLAC TROMETHAMINE 15 MG/ML IJ SOLN
15.0000 mg | Freq: Once | INTRAMUSCULAR | Status: AC
  Administered 2024-02-17: 15 mg via INTRAVENOUS
  Filled 2024-02-17: qty 1

## 2024-02-17 MED ORDER — AMOXICILLIN-POT CLAVULANATE 875-125 MG PO TABS
1.0000 | ORAL_TABLET | Freq: Once | ORAL | Status: AC
  Administered 2024-02-17: 1 via ORAL
  Filled 2024-02-17: qty 1

## 2024-02-17 MED ORDER — ACETAMINOPHEN 500 MG PO TABS
1000.0000 mg | ORAL_TABLET | Freq: Once | ORAL | Status: AC
  Administered 2024-02-17: 1000 mg via ORAL
  Filled 2024-02-17: qty 2

## 2024-02-17 NOTE — ED Notes (Signed)
 Urine collected by using syringe from the foley catheter.

## 2024-02-17 NOTE — ED Triage Notes (Addendum)
 Bib by EMS for fever onset today. Patient awake, alert and oriented. Breathing even and unlabored. Patient's face looked flushed. Denies pain, N/V, shortness of breath/  IV g 20 left AC inserted by EMS-NS 1 liter infusing.

## 2024-02-17 NOTE — ED Provider Notes (Signed)
 [place] EMERGENCY DEPARTMENT AT [facility] Provider Note   CSN: [id number] Arrival date & time: 02/17/24  1651     History  Chief Complaint  Patient presents with   Code Sepsis    Cameron [name] is a 73 y.o. male.  73 yo M with a chief complaint of fever.  Patient did physical therapy today and noticed that he was more fatigued than normal he got to his car on the way home and he felt very cold and had his seat heater on.  When he got home he checked his temperature and found to be 102.  Called oncology told to come to         Home Medications Prior to Admission medications   Medication Sig Start Date End Date Taking? Authorizing Provider  amoxicillin -clavulanate (AUGMENTIN ) 875-125 MG tablet Take 1 tablet by mouth every 12 (twelve) hours. 02/17/24  Yes Albertus Hughs, DO  acetaminophen  (TYLENOL ) 500 MG tablet Take 1-2 tablets (500-1,000 mg total) by mouth every 6 (six) hours as needed. 10/06/23   Barrett, Erin R, PA-C  amiodarone  (PACERONE ) 200 MG tablet Take 1 tablet (200 mg total) by mouth 2 (two) times daily for 10 days, then decrease to 1 tablet (200 mg) daily. Patient not taking: Reported on 02/02/2024 10/06/23 11/25/23  Barrett, Malachy Scripture, PA-C  Ascorbic Acid (VITAMIN C PO) Take 1 tablet by mouth in the morning. Patient not taking: Reported on 02/02/2024    [provider]  atorvastatin  (LIPITOR) 20 MG tablet Take 1 tablet (20 mg total) by mouth daily. 12/21/23   Dorothe Gaster, NP  dexamethasone  (DECADRON ) 4 MG tablet Take 2 tabs (8 mg) twice daily starting the day before docetaxel , then daily x 3 days after cisplatin .Take with food. 11/10/23   Marlene Simas, MD  dorzolamide  (TRUSOPT ) 2 % ophthalmic solution Administer into right eye twice a day. 02/02/24     fluticasone  (FLONASE ) 50 MCG/ACT nasal spray Place 1 spray into both nostrils daily.    [provider]  fluticasone -salmeterol (ADVAIR  DISKUS) 250-50 MCG/ACT AEPB Inhale 1 puff into the lungs in the  morning and at bedtime. 12/22/23   Dorothe Gaster, NP  gabapentin  (NEURONTIN ) 300 MG capsule Take 1 capsule (300 mg total) by mouth 2 (two) times daily. 02/15/24 02/14/25  Dorothe Gaster, NP  hydrocortisone  ointment 0.5 % Apply 1 Application topically 2 (two) times daily. 12/01/23   Walisiewicz, Kaitlyn E, PA-C  lidocaine -prilocaine  (EMLA ) cream Apply to the Port-A-Cath site 30-60 minutes before chemotherapy 12/14/23   Marlene Simas, MD  magnesium  oxide (MAG-OX) 400 (240 Mg) MG tablet Take 1 tablet (400 mg total) by mouth 3 (three) times daily. 02/13/24 03/14/24  Marlene Simas, MD  metoCLOPramide  (REGLAN ) 5 MG tablet Take 1 tablet (5 mg total) by mouth every 8 (eight) hours as needed for nausea or vomiting. 02/05/24   Ozell Blunt, MD  ondansetron  (ZOFRAN ) 8 MG tablet Take 1 tablet (8 mg total) by mouth every 8 (eight) hours as needed for nausea or vomiting. Begin on the third day after cisplatin  chemotherapy. 11/10/23   Marlene Simas, MD  oxyCODONE  (OXY IR/ROXICODONE ) 5 MG immediate release tablet Take 1-2 tablets (5-10 mg total) by mouth every 4 (four) hours as needed for moderate pain (pain score 4-6). 10/06/23   Barrett, Malachy Scripture, PA-C  pantoprazole  (PROTONIX ) 40 MG tablet Take 1 tablet (40 mg total) by mouth daily at 12 noon. 02/06/24   Ozell Blunt, MD  potassium chloride  SA (  KLOR-CON  M) 20 MEQ tablet Take 1 tablet (20 mEq total) by mouth daily. 02/13/24   Dorothe Gaster, NP  valACYclovir (VALTREX) 1000 MG tablet Take 1,000 mg by mouth 2 (two) times daily as needed (fever blisters/outbreaks). 04/05/23   [provider]  valACYclovir (VALTREX) 1000 MG tablet Take 1 tablet (1,000 mg total) by mouth 2 (two) times daily for 3 days. Start within 72 hours of the first signs of a flare. 04/05/23   Drusilla Gerlach, MD  chlorproMAZINE  (THORAZINE ) 25 MG tablet Take 1 tablet (25 mg total) by mouth 3 (three) times daily as needed for hiccoughs. 01/11/24 01/30/24  Marlene Simas, MD      Allergies    Compazine   [prochlorperazine ]    Review of Systems   Review of Systems  Physical Exam Updated Vital Signs BP (!) 147/78   Pulse 89   Temp 98.2 F (36.8 C) (Oral)   Resp 20   Ht 5\' 11"  (1.803 m)   Wt 87.1 kg   SpO2 95%   BMI 26.78 kg/m  Physical Exam Vitals and nursing note reviewed.  Constitutional:      Appearance: He is well-developed.  HENT:     Head: Normocephalic and atraumatic.  Eyes:     Pupils: Pupils are equal, round, and reactive to light.  Neck:     Vascular: No JVD.  Cardiovascular:     Rate and Rhythm: Normal rate and regular rhythm.     Heart sounds: No murmur heard.    No friction rub. No gallop.  Pulmonary:     Effort: No respiratory distress.     Breath sounds: No wheezing.  Abdominal:     General: There is no distension.     Tenderness: There is no abdominal tenderness. There is no guarding or rebound.  Musculoskeletal:        General: Normal range of motion.     Cervical back: Normal range of motion and neck supple.  Skin:    Coloration: Skin is not pale.     Findings: No rash.  Neurological:     Mental Status: He is alert and oriented to person, place, and time.  Psychiatric:        Behavior: Behavior normal.     ED Results / Procedures / Treatments   Labs (all labs ordered are listed, but only abnormal results are displayed) Labs Reviewed  COMPREHENSIVE METABOLIC PANEL WITH GFR - Abnormal; Notable for the following components:      Result Value   Sodium 133 (*)    Chloride 97 (*)    Glucose, Bld 154 (*)    Calcium  8.1 (*)    Total Protein 5.2 (*)    Albumin  2.5 (*)    All other components within normal limits  CBC WITH DIFFERENTIAL/PLATELET - Abnormal; Notable for the following components:   WBC 16.7 (*)    RBC 2.92 (*)    Hemoglobin 9.5 (*)    HCT 29.3 (*)    MCV 100.3 (*)    RDW 18.2 (*)    Neutro Abs 14.3 (*)    Monocytes Absolute 1.2 (*)    Abs Immature Granulocytes 0.13 (*)    All other components within normal limits   URINALYSIS, W/ REFLEX TO CULTURE (INFECTION SUSPECTED) - Abnormal; Notable for the following components:   Leukocytes,Ua SMALL (*)    All other components within normal limits  I-STAT CG4 LACTIC ACID, ED - Abnormal; Notable for the following components:   Lactic Acid,  Venous 2.0 (*)    All other components within normal limits  CULTURE, BLOOD (ROUTINE X 2)  CULTURE, BLOOD (ROUTINE X 2)  URINE CULTURE  I-STAT CG4 LACTIC ACID, ED    EKG None  Radiology DG Chest Port 1 View Result Date: 02/17/2024 EXAM: 1 VIEW XRAY OF THE CHEST 02/17/2024 05:31:00 PM COMPARISON: 02/09/2024 CLINICAL HISTORY: Questionable sepsis - evaluate for abnormality. FINDINGS: LUNGS AND PLEURA: No focal pulmonary opacity. No pulmonary edema. No pleural effusion. No pneumothorax. HEART AND MEDIASTINUM: No acute abnormality of the cardiac and mediastinal silhouettes. Sclerotic calcifications are present at the aortic arch. BONES AND SOFT TISSUES: No acute osseous abnormality. A right IJ port-a-cath is stable. IMPRESSION: 1. No acute findings. Electronically signed by: Audree Leas MD 02/17/2024 07:05 PM EDT RP Workstation: [id number]    Procedures Procedures    Medications Ordered in ED Medications  acetaminophen  (TYLENOL ) tablet 1,000 mg (1,000 mg Oral Given 02/17/24 2026)  ketorolac  (TORADOL ) 15 MG/ML injection 15 mg (15 mg Intravenous Given 02/17/24 2026)  amoxicillin -clavulanate (AUGMENTIN ) 875-125 MG per tablet 1 tablet (1 tablet Oral Given 02/17/24 2026)    ED Course/ Medical Decision Making/ A&P                                 Medical Decision Making Amount and/or Complexity of Data Reviewed Labs: ordered. Radiology: ordered.  Risk OTC drugs. Prescription drug management.   78 yoM with a chief complaint of a fever.  Started today.  As high as 103.  He denies any obvious source.  Most recent cancer therapy was 3 weeks ago.  UA negative for obvious infection.  Chest x-ray independently  interpreted by me without focal infiltrate or pneumothorax.  He does have a leukocytosis.  He is not neutropenic.  I discussed results with the patient and family.  Patient would like to go home at this time.  Will start him on Augmentin .  Encouraged him to follow-up with his PCP and oncologist.  8:56 PM:  I have discussed the diagnosis/risks/treatment options with the patient.  Evaluation and diagnostic testing in the emergency department does not suggest an emergent condition requiring admission or immediate intervention beyond what has been performed at this time.  They will follow up with PCP. We also discussed returning to the ED immediately if new or worsening sx occur. We discussed the sx which are most concerning (e.g., sudden worsening pain, fever, inability to tolerate by mouth) that necessitate immediate return. Medications administered to the patient during their visit and any new prescriptions provided to the patient are listed below.  Medications given during this visit Medications  acetaminophen  (TYLENOL ) tablet 1,000 mg (1,000 mg Oral Given 02/17/24 2026)  ketorolac  (TORADOL ) 15 MG/ML injection 15 mg (15 mg Intravenous Given 02/17/24 2026)  amoxicillin -clavulanate (AUGMENTIN ) 875-125 MG per tablet 1 tablet (1 tablet Oral Given 02/17/24 2026)     The patient appears reasonably screen and/or stabilized for discharge and I doubt any other medical condition or other [facility] requiring further screening, evaluation, or treatment in the ED at this time prior to discharge.          Final Clinical Impression(s) / ED Diagnoses Final diagnoses:  Fever in adult    Rx / DC Orders ED Discharge Orders          Ordered    amoxicillin -clavulanate (AUGMENTIN ) 875-125 MG tablet  Every 12 hours        02/17/24  [address]              Albertus Hughs, [place] 02/17/24 2056

## 2024-02-17 NOTE — Telephone Encounter (Signed)
 Patients wife called in stating that her husband had PT this morning . After his therapy he was very tired but was able to complete his PT. He went to sleep after he was done when he woke up he was trembling and cold. She took him to the grocery store with her had to turn seat warmer on to keep him warm. When she came back from the store he was to weak to get out of the car she had to get 2 other people to help get him into the house. I asked if he had a temp wife stated that he didn't this morning with the therapist but she took it again while we were on the phone and it was 102.5. I made Cassie Heilingoetter PA aware she stated he needs to go to the ER to be seen. I made the patients wife aware and she understood, stated she would call EMS to come get him. She had no further questions at this time.

## 2024-02-17 NOTE — Discharge Instructions (Signed)
 We did not find an obvious source for your fever.  Please return for worsening symptoms.  Please follow-up with your family doctor in the office.  Take tylenol  2 pills 4 times a day and motrin 4 pills 3 times a day.  Drink plenty of fluids.  Return for worsening shortness of breath, headache, confusion. Follow up with your family doctor.

## 2024-02-18 ENCOUNTER — Inpatient Hospital Stay (HOSPITAL_COMMUNITY)
Admission: EM | Admit: 2024-02-18 | Discharge: 2024-02-21 | DRG: 698 | Disposition: A | Discharge status: Home Health | Attending: Internal Medicine | Admitting: Internal Medicine

## 2024-02-18 ENCOUNTER — Emergency Department (HOSPITAL_COMMUNITY)

## 2024-02-18 ENCOUNTER — Other Ambulatory Visit: Payer: Self-pay

## 2024-02-18 ENCOUNTER — Encounter (HOSPITAL_COMMUNITY): Payer: Self-pay | Admitting: *Deleted

## 2024-02-18 DIAGNOSIS — K219 Gastro-esophageal reflux disease without esophagitis: Secondary | ICD-10-CM | POA: Diagnosis not present

## 2024-02-18 DIAGNOSIS — N400 Enlarged prostate without lower urinary tract symptoms: Secondary | ICD-10-CM | POA: Diagnosis not present

## 2024-02-18 DIAGNOSIS — Z888 Allergy status to other drugs, medicaments and biological substances status: Secondary | ICD-10-CM

## 2024-02-18 DIAGNOSIS — Z9841 Cataract extraction status, right eye: Secondary | ICD-10-CM

## 2024-02-18 DIAGNOSIS — R6510 Systemic inflammatory response syndrome (SIRS) of non-infectious origin without acute organ dysfunction: Secondary | ICD-10-CM | POA: Diagnosis present

## 2024-02-18 DIAGNOSIS — Z1152 Encounter for screening for COVID-19: Secondary | ICD-10-CM

## 2024-02-18 DIAGNOSIS — Y846 Urinary catheterization as the cause of abnormal reaction of the patient, or of later complication, without mention of misadventure at the time of the procedure: Secondary | ICD-10-CM | POA: Diagnosis present

## 2024-02-18 DIAGNOSIS — I2510 Atherosclerotic heart disease of native coronary artery without angina pectoris: Secondary | ICD-10-CM | POA: Diagnosis present

## 2024-02-18 DIAGNOSIS — T83518A Infection and inflammatory reaction due to other urinary catheter, initial encounter: Principal | ICD-10-CM | POA: Diagnosis present

## 2024-02-18 DIAGNOSIS — N179 Acute kidney failure, unspecified: Secondary | ICD-10-CM | POA: Diagnosis present

## 2024-02-18 DIAGNOSIS — I959 Hypotension, unspecified: Secondary | ICD-10-CM | POA: Diagnosis present

## 2024-02-18 DIAGNOSIS — J439 Emphysema, unspecified: Secondary | ICD-10-CM | POA: Diagnosis not present

## 2024-02-18 DIAGNOSIS — Z860102 Personal history of hyperplastic colon polyps: Secondary | ICD-10-CM

## 2024-02-18 DIAGNOSIS — Z811 Family history of alcohol abuse and dependence: Secondary | ICD-10-CM

## 2024-02-18 DIAGNOSIS — N390 Urinary tract infection, site not specified: Secondary | ICD-10-CM | POA: Diagnosis not present

## 2024-02-18 DIAGNOSIS — R339 Retention of urine, unspecified: Secondary | ICD-10-CM | POA: Diagnosis not present

## 2024-02-18 DIAGNOSIS — F17210 Nicotine dependence, cigarettes, uncomplicated: Secondary | ICD-10-CM | POA: Diagnosis not present

## 2024-02-18 DIAGNOSIS — N281 Cyst of kidney, acquired: Secondary | ICD-10-CM | POA: Diagnosis not present

## 2024-02-18 DIAGNOSIS — Z8546 Personal history of malignant neoplasm of prostate: Secondary | ICD-10-CM

## 2024-02-18 DIAGNOSIS — Z9079 Acquired absence of other genital organ(s): Secondary | ICD-10-CM

## 2024-02-18 DIAGNOSIS — Z860101 Personal history of adenomatous and serrated colon polyps: Secondary | ICD-10-CM

## 2024-02-18 DIAGNOSIS — I10 Essential (primary) hypertension: Secondary | ICD-10-CM | POA: Diagnosis present

## 2024-02-18 DIAGNOSIS — R197 Diarrhea, unspecified: Secondary | ICD-10-CM | POA: Diagnosis present

## 2024-02-18 DIAGNOSIS — Z7951 Long term (current) use of inhaled steroids: Secondary | ICD-10-CM

## 2024-02-18 DIAGNOSIS — D751 Secondary polycythemia: Secondary | ICD-10-CM | POA: Diagnosis not present

## 2024-02-18 DIAGNOSIS — Z8719 Personal history of other diseases of the digestive system: Secondary | ICD-10-CM

## 2024-02-18 DIAGNOSIS — E785 Hyperlipidemia, unspecified: Secondary | ICD-10-CM | POA: Diagnosis present

## 2024-02-18 DIAGNOSIS — Z9842 Cataract extraction status, left eye: Secondary | ICD-10-CM

## 2024-02-18 DIAGNOSIS — Y738 Miscellaneous gastroenterology and urology devices associated with adverse incidents, not elsewhere classified: Secondary | ICD-10-CM | POA: Diagnosis present

## 2024-02-18 DIAGNOSIS — Z4682 Encounter for fitting and adjustment of non-vascular catheter: Secondary | ICD-10-CM | POA: Diagnosis not present

## 2024-02-18 DIAGNOSIS — E876 Hypokalemia: Secondary | ICD-10-CM | POA: Diagnosis not present

## 2024-02-18 DIAGNOSIS — G629 Polyneuropathy, unspecified: Secondary | ICD-10-CM | POA: Diagnosis not present

## 2024-02-18 DIAGNOSIS — Z83438 Family history of other disorder of lipoprotein metabolism and other lipidemia: Secondary | ICD-10-CM

## 2024-02-18 DIAGNOSIS — Z79899 Other long term (current) drug therapy: Secondary | ICD-10-CM

## 2024-02-18 DIAGNOSIS — R509 Fever, unspecified: Secondary | ICD-10-CM | POA: Diagnosis not present

## 2024-02-18 DIAGNOSIS — Z820 Family history of epilepsy and other diseases of the nervous system: Secondary | ICD-10-CM

## 2024-02-18 DIAGNOSIS — Z8249 Family history of ischemic heart disease and other diseases of the circulatory system: Secondary | ICD-10-CM

## 2024-02-18 DIAGNOSIS — A419 Sepsis, unspecified organism: Secondary | ICD-10-CM | POA: Diagnosis present

## 2024-02-18 DIAGNOSIS — I7143 Infrarenal abdominal aortic aneurysm, without rupture: Secondary | ICD-10-CM | POA: Diagnosis not present

## 2024-02-18 DIAGNOSIS — A4152 Sepsis due to Pseudomonas: Secondary | ICD-10-CM | POA: Diagnosis not present

## 2024-02-18 DIAGNOSIS — Z9049 Acquired absence of other specified parts of digestive tract: Secondary | ICD-10-CM

## 2024-02-18 DIAGNOSIS — R9082 White matter disease, unspecified: Secondary | ICD-10-CM | POA: Diagnosis not present

## 2024-02-18 DIAGNOSIS — R55 Syncope and collapse: Principal | ICD-10-CM | POA: Diagnosis present

## 2024-02-18 DIAGNOSIS — I700 Atherosclerosis of aorta: Secondary | ICD-10-CM | POA: Diagnosis not present

## 2024-02-18 DIAGNOSIS — J449 Chronic obstructive pulmonary disease, unspecified: Secondary | ICD-10-CM | POA: Diagnosis not present

## 2024-02-18 DIAGNOSIS — Z833 Family history of diabetes mellitus: Secondary | ICD-10-CM

## 2024-02-18 DIAGNOSIS — Z85118 Personal history of other malignant neoplasm of bronchus and lung: Secondary | ICD-10-CM

## 2024-02-18 DIAGNOSIS — Z825 Family history of asthma and other chronic lower respiratory diseases: Secondary | ICD-10-CM

## 2024-02-18 DIAGNOSIS — Z9889 Other specified postprocedural states: Secondary | ICD-10-CM

## 2024-02-18 DIAGNOSIS — Z9221 Personal history of antineoplastic chemotherapy: Secondary | ICD-10-CM

## 2024-02-18 LAB — COMPREHENSIVE METABOLIC PANEL WITH GFR
ALT: 10 U/L (ref 0–44)
AST: 16 U/L (ref 15–41)
Albumin: 2.3 g/dL — ABNORMAL LOW (ref 3.5–5.0)
Alkaline Phosphatase: 66 U/L (ref 38–126)
Anion gap: 6 (ref 5–15)
BUN: 18 mg/dL (ref 8–23)
CO2: 27 mmol/L (ref 22–32)
Calcium: 7.6 mg/dL — ABNORMAL LOW (ref 8.9–10.3)
Chloride: 97 mmol/L — ABNORMAL LOW (ref 98–111)
Creatinine, Ser: 1.44 mg/dL — ABNORMAL HIGH (ref 0.61–1.24)
GFR, Estimated: 52 mL/min — ABNORMAL LOW (ref >60)
Glucose, Bld: 172 mg/dL — ABNORMAL HIGH (ref 70–99)
Potassium: 4.2 mmol/L (ref 3.5–5.1)
Sodium: 130 mmol/L — ABNORMAL LOW (ref 135–145)
Total Bilirubin: 1 mg/dL (ref 0.0–1.2)
Total Protein: 4.7 g/dL — ABNORMAL LOW (ref 6.5–8.1)

## 2024-02-18 LAB — BLOOD CULTURE ID PANEL (REFLEXED) - BCID2

## 2024-02-18 LAB — CBC WITH DIFFERENTIAL/PLATELET
Abs Immature Granulocytes: 0.17 10*3/uL — ABNORMAL HIGH (ref 0.00–0.07)
Basophils Absolute: 0 10*3/uL (ref 0.0–0.1)
Basophils Relative: 0 %
Eosinophils Absolute: 0 10*3/uL (ref 0.0–0.5)
Eosinophils Relative: 0 %
HCT: 27.3 % — ABNORMAL LOW (ref 39.0–52.0)
Hemoglobin: 8.8 g/dL — ABNORMAL LOW (ref 13.0–17.0)
Immature Granulocytes: 1 %
Lymphocytes Relative: 4 %
Lymphs Abs: 0.7 10*3/uL (ref 0.7–4.0)
MCH: 33.6 pg (ref 26.0–34.0)
MCHC: 32.2 g/dL (ref 30.0–36.0)
MCV: 104.2 fL — ABNORMAL HIGH (ref 80.0–100.0)
Monocytes Absolute: 1.2 10*3/uL — ABNORMAL HIGH (ref 0.1–1.0)
Monocytes Relative: 7 %
Neutro Abs: 16 10*3/uL — ABNORMAL HIGH (ref 1.7–7.7)
Neutrophils Relative %: 88 %
Platelets: 182 10*3/uL (ref 150–400)
RBC: 2.62 MIL/uL — ABNORMAL LOW (ref 4.22–5.81)
RDW: 18.8 % — ABNORMAL HIGH (ref 11.5–15.5)
WBC: 18.1 10*3/uL — ABNORMAL HIGH (ref 4.0–10.5)
nRBC: 0 % (ref 0.0–0.2)

## 2024-02-18 LAB — URINALYSIS, W/ REFLEX TO CULTURE (INFECTION SUSPECTED)
Bacteria, UA: NONE SEEN
Bilirubin Urine: NEGATIVE
Glucose, UA: NEGATIVE mg/dL
Ketones, ur: NEGATIVE mg/dL
Nitrite: NEGATIVE
Protein, ur: 30 mg/dL — AB
Specific Gravity, Urine: 1.015 (ref 1.005–1.030)
WBC, UA: >50 WBC/hpf (ref 0–5)
pH: 5 (ref 5.0–8.0)

## 2024-02-18 LAB — PROTIME-INR
INR: 1.4 — ABNORMAL HIGH (ref 0.8–1.2)
Prothrombin Time: 16.9 s — ABNORMAL HIGH (ref 11.4–15.2)

## 2024-02-18 LAB — RESP PANEL BY RT-PCR (RSV, FLU A&B, COVID)  RVPGX2
Influenza A by PCR: NEGATIVE
Influenza B by PCR: NEGATIVE
Resp Syncytial Virus by PCR: NEGATIVE
SARS Coronavirus 2 by RT PCR: NEGATIVE

## 2024-02-18 LAB — MAGNESIUM: Magnesium: 2.2 mg/dL (ref 1.7–2.4)

## 2024-02-18 LAB — TROPONIN I (HIGH SENSITIVITY)
Troponin I (High Sensitivity): 43 ng/L — ABNORMAL HIGH (ref <18)
Troponin I (High Sensitivity): 42 ng/L — ABNORMAL HIGH (ref <18)

## 2024-02-18 LAB — CBG MONITORING, ED: Glucose-Capillary: 145 mg/dL — ABNORMAL HIGH (ref 70–99)

## 2024-02-18 LAB — I-STAT CG4 LACTIC ACID, ED: Lactic Acid, Venous: 1.2 mmol/L (ref 0.5–1.9)

## 2024-02-18 MED ORDER — ACETAMINOPHEN 650 MG RE SUPP: 650.0000 mg | Freq: Four times a day (QID) | RECTAL | Status: DC | PRN

## 2024-02-18 MED ORDER — CHLORHEXIDINE GLUCONATE CLOTH 2 % EX PADS
6.0000 | MEDICATED_PAD | Freq: Every day | CUTANEOUS | Status: DC
  Administered 2024-02-18 – 2024-02-21 (×2): 6 via TOPICAL

## 2024-02-18 MED ORDER — TRAZODONE HCL 50 MG PO TABS: 25.0000 mg | ORAL_TABLET | Freq: Every evening | ORAL | Status: DC | PRN

## 2024-02-18 MED ORDER — PANTOPRAZOLE SODIUM 40 MG PO TBEC
40.0000 mg | DELAYED_RELEASE_TABLET | Freq: Every day | ORAL | Status: DC
  Administered 2024-02-20 – 2024-02-21 (×2): 40 mg via ORAL
  Filled 2024-02-18 (×2): qty 1

## 2024-02-18 MED ORDER — ONDANSETRON HCL 4 MG/2ML IJ SOLN: 4.0000 mg | Freq: Four times a day (QID) | INTRAMUSCULAR | Status: DC | PRN

## 2024-02-18 MED ORDER — IOHEXOL 350 MG/ML SOLN
100.0000 mL | Freq: Once | INTRAVENOUS | Status: AC | PRN
  Administered 2024-02-18: 100 mL via INTRAVENOUS

## 2024-02-18 MED ORDER — DORZOLAMIDE HCL 2 % OP SOLN: 1.0000 [drp] | Freq: Two times a day (BID) | OPHTHALMIC | Status: DC

## 2024-02-18 MED ORDER — METRONIDAZOLE 500 MG/100ML IV SOLN
500.0000 mg | Freq: Once | INTRAVENOUS | Status: AC
  Administered 2024-02-18: 500 mg via INTRAVENOUS
  Filled 2024-02-18: qty 100

## 2024-02-18 MED ORDER — ONDANSETRON HCL 4 MG PO TABS: 4.0000 mg | ORAL_TABLET | Freq: Four times a day (QID) | ORAL | Status: DC | PRN

## 2024-02-18 MED ORDER — LACTATED RINGERS IV SOLN: INTRAVENOUS | Status: DC

## 2024-02-18 MED ORDER — METRONIDAZOLE 500 MG/100ML IV SOLN
500.0000 mg | Freq: Two times a day (BID) | INTRAVENOUS | Status: DC
  Administered 2024-02-18: 500 mg via INTRAVENOUS
  Filled 2024-02-18: qty 100

## 2024-02-18 MED ORDER — GABAPENTIN 300 MG PO CAPS
300.0000 mg | ORAL_CAPSULE | Freq: Two times a day (BID) | ORAL | Status: DC
  Administered 2024-02-18 – 2024-02-21 (×6): 300 mg via ORAL
  Filled 2024-02-18 (×6): qty 1

## 2024-02-18 MED ORDER — ATORVASTATIN CALCIUM 10 MG PO TABS
20.0000 mg | ORAL_TABLET | Freq: Every day | ORAL | Status: DC
  Administered 2024-02-19 – 2024-02-21 (×3): 20 mg via ORAL
  Filled 2024-02-18 (×3): qty 2

## 2024-02-18 MED ORDER — ALBUTEROL SULFATE (2.5 MG/3ML) 0.083% IN NEBU: 2.5000 mg | INHALATION_SOLUTION | RESPIRATORY_TRACT | Status: DC | PRN

## 2024-02-18 MED ORDER — LACTATED RINGERS IV BOLUS
1000.0000 mL | Freq: Once | INTRAVENOUS | Status: AC
  Administered 2024-02-18: 1000 mL via INTRAVENOUS

## 2024-02-18 MED ORDER — SODIUM CHLORIDE 0.9 % IV SOLN
2.0000 g | Freq: Once | INTRAVENOUS | Status: AC
  Administered 2024-02-18: 2 g via INTRAVENOUS
  Filled 2024-02-18: qty 12.5

## 2024-02-18 MED ORDER — VANCOMYCIN HCL 1500 MG/300ML IV SOLN
1500.0000 mg | INTRAVENOUS | Status: DC
  Filled 2024-02-18: qty 300

## 2024-02-18 MED ORDER — VANCOMYCIN HCL IN DEXTROSE 1-5 GM/200ML-% IV SOLN
1000.0000 mg | Freq: Once | INTRAVENOUS | Status: AC
  Administered 2024-02-18: 1000 mg via INTRAVENOUS
  Filled 2024-02-18: qty 200

## 2024-02-18 MED ORDER — SODIUM CHLORIDE 0.9 % IV SOLN
2.0000 g | Freq: Two times a day (BID) | INTRAVENOUS | Status: DC
  Administered 2024-02-18 – 2024-02-21 (×6): 2 g via INTRAVENOUS
  Filled 2024-02-18 (×6): qty 12.5

## 2024-02-18 MED ORDER — ACETAMINOPHEN 325 MG PO TABS: 650.0000 mg | ORAL_TABLET | Freq: Four times a day (QID) | ORAL | Status: DC | PRN

## 2024-02-18 MED ORDER — LACTATED RINGERS IV BOLUS (SEPSIS)
1000.0000 mL | Freq: Once | INTRAVENOUS | Status: AC
  Administered 2024-02-18: 1000 mL via INTRAVENOUS

## 2024-02-18 MED ORDER — FLUTICASONE FUROATE-VILANTEROL 200-25 MCG/ACT IN AEPB
1.0000 | INHALATION_SPRAY | Freq: Every day | RESPIRATORY_TRACT | Status: DC
  Administered 2024-02-19 – 2024-02-21 (×3): 1 via RESPIRATORY_TRACT
  Filled 2024-02-18: qty 28

## 2024-02-18 MED ORDER — LACTATED RINGERS IV SOLN: INTRAVENOUS | Status: AC

## 2024-02-18 MED ORDER — ENOXAPARIN SODIUM 40 MG/0.4ML IJ SOSY
40.0000 mg | PREFILLED_SYRINGE | Freq: Every day | INTRAMUSCULAR | Status: DC
  Administered 2024-02-18 – 2024-02-20 (×3): 40 mg via SUBCUTANEOUS
  Filled 2024-02-18 (×3): qty 0.4

## 2024-02-18 NOTE — H&P (Signed)
 History and Physical  Cameron [name]:[id number] DOB: September 26, 1951 DOA: 02/18/2024  PCP: Dorothe Gaster, NP   Chief Complaint: Fever, syncope  HPI: Cameron [name] is a 73 y.o. male with medical history significant for COPD on room air, hyperlipidemia, hypertension, lung cancer recently completed chemotherapy being admitted to the hospital with fever and sepsis of unclear etiology.  On discussing with the patient, as well as review of the medical record, it seems that he has had increasing weakness, lethargy, and subjective fevers and chills for the last 2 or 3 days.  No sick contacts, denies any cough, chest pain or nausea.  Had some diarrhea earlier today at home.  He was actually seen in the ER yesterday after he had a fever at home of 102.  Workup in the ER was relatively benign, he was given empiric Augmentin  and discharged home.  This morning, he had a syncopal episode at home in the bathroom, had prodrome of dizziness and lightheadedness was able to catch himself on a handrail and ease himself to the ground.  Currently has no specific symptoms other than generalized weakness, he also specifically denies any flank tenderness. Of note, he was seen in the ER on 5/16 for urinary retention and had a Foley catheter placed which is still present.  He plans to follow-up with urology next week.  Review of Systems: Please see HPI for pertinent positives and negatives. A complete 10 system review of systems are otherwise negative.  Past Medical History:  Diagnosis Date   Adenomatous colon polyp 05/2009; 11/2014   2010:Tubular adenoma, no high grade dysplasia.[name]  + Hyperplastic polyps. 2016: Tubular adenoma-rpt 5 yrs.   Cancer (HCC)    TURP for prostate cancer   COPD (chronic obstructive pulmonary disease) (HCC)    Coronary artery disease    Difficult intubation    H/O chronic gastritis 05/2009   EGD: no h.pylori,dysplasia,or evidence of malignancy   Hyperlipidemia    lovaza from prior PMD-stopped due to  cost   Hypertension    Obesity, Class I, BMI 30.0-34.9 (see actual BMI) 04/23/2014   Tobacco dependence    Ventral hernia    small   Past Surgical History:  Procedure Laterality Date   APPENDECTOMY  09/28/1971   BIOPSY  08/15/2023   Procedure: BIOPSY;  Surgeon: Prudy Brownie, DO;  Location: [facility];  Service: Pulmonary;;   BLADDER DIVERTICULECTOMY N/A 09/30/2022   Procedure: BLADDER DIVERTICULECTOMY;  Surgeon: Florencio Hunting, MD;  Location: [facility];  Service: Urology;  Laterality: N/A;   CARPAL TUNNEL RELEASE Left    CHOLECYSTECTOMY N/A 07/06/2018   Procedure: LAPAROSCOPIC CHOLECYSTECTOMY WITH INTRAOPERATIVE CHOLANGIOGRAM;  Surgeon: Juanita Norlander, MD;  Location: [facility] OR;  Service: General;  Laterality: N/A;   COLONOSCOPY  09/27/2008   [facility]   CYSTOSCOPY N/A 09/30/2022   Procedure: Ardith Bedford;  Surgeon: Florencio Hunting, MD;  Location: [facility];  Service: Urology;  Laterality: N/A;   EYE SURGERY  2018   Cataracts removed only   FINGER SURGERY Left 12/2021   Left middle finger, laceration   HEMOSTASIS CONTROL  08/15/2023   Procedure: HEMOSTASIS CONTROL;  Surgeon: Prudy Brownie, DO;  Location: [facility];  Service: Pulmonary;;   INTERCOSTAL NERVE BLOCK Left 10/03/2023   Procedure: INTERCOSTAL NERVE BLOCK;  Surgeon: Zelphia Higashi, MD;  Location: [facility] OR;  Service: Thoracic;  Laterality: Left;   IR IMAGING GUIDED PORT INSERTION  12/08/2023   LYMPH NODE BIOPSY Left 10/03/2023   Procedure: LYMPH NODE  BIOPSY;  Surgeon: Zelphia Higashi, MD;  Location: [facility] OR;  Service: Thoracic;  Laterality: Left;   ROBOT ASSISTED LAPAROSCOPIC RADICAL PROSTATECTOMY N/A 09/30/2022   Procedure: XI ROBOTIC ASSISTED LAPAROSCOPIC RADICAL PROSTATECTOMY LEVEL 3;  Surgeon: Florencio Hunting, MD;  Location: [facility];  Service: Urology;  Laterality: N/A;  270 MINUTES NEEDED FOR CASE   STRABISMUS SURGERY  age 27 or 9   TRANSURETHRAL RESECTION OF PROSTATE N/A 07/08/2022   Procedure: TRANSURETHRAL RESECTION  OF THE PROSTATE (TURP)/ CYSTOSCOPY;  Surgeon: Florencio Hunting, MD;  Location: [facility];  Service: Urology;  Laterality: N/A;   VIDEO BRONCHOSCOPY N/A 08/15/2023   Procedure: VIDEO BRONCHOSCOPY WITHOUT FLUORO;  Surgeon: Prudy Brownie, DO;  Location: [facility];  Service: Pulmonary;  Laterality: N/A;   Social History:  reports that he has been smoking cigarettes. He has a 40 pack-year smoking history. He has never used smokeless tobacco. He reports that he does not currently use alcohol. He reports that he does not use drugs.  Allergies  Allergen Reactions   Compazine  [Prochlorperazine ] Diarrhea    syncope    Family History  Problem Relation Age of Onset   Diabetes Mother    Heart disease Mother    COPD Father    Hypertension Father    Hyperlipidemia Father    Heart attack Father    Dementia Father    Alcohol abuse Maternal Grandfather      Prior to Admission medications   Medication Sig Start Date End Date Taking? Authorizing Provider  acetaminophen  (TYLENOL ) 500 MG tablet Take 1-2 tablets (500-1,000 mg total) by mouth every 6 (six) hours as needed. 10/06/23   Barrett, Erin R, PA-C  amiodarone  (PACERONE ) 200 MG tablet Take 1 tablet (200 mg total) by mouth 2 (two) times daily for 10 days, then decrease to 1 tablet (200 mg) daily. Patient not taking: Reported on 02/02/2024 10/06/23 11/25/23  Barrett, Malachy Scripture, PA-C  amoxicillin -clavulanate (AUGMENTIN ) 875-125 MG tablet Take 1 tablet by mouth every 12 (twelve) hours. 02/17/24   Albertus Hughs, DO  Ascorbic Acid (VITAMIN C PO) Take 1 tablet by mouth in the morning. Patient not taking: Reported on 02/02/2024    [provider]  atorvastatin  (LIPITOR) 20 MG tablet Take 1 tablet (20 mg total) by mouth daily. 12/21/23   Dorothe Gaster, NP  dexamethasone  (DECADRON ) 4 MG tablet Take 2 tabs (8 mg) twice daily starting the day before docetaxel , then daily x 3 days after cisplatin .Take with food. 11/10/23   Marlene Simas, MD  dorzolamide  (TRUSOPT ) 2  % ophthalmic solution Administer into right eye twice a day. 02/02/24     fluticasone  (FLONASE ) 50 MCG/ACT nasal spray Place 1 spray into both nostrils daily.    [provider]  fluticasone -salmeterol (ADVAIR  DISKUS) 250-50 MCG/ACT AEPB Inhale 1 puff into the lungs in the morning and at bedtime. 12/22/23   Dorothe Gaster, NP  gabapentin  (NEURONTIN ) 300 MG capsule Take 1 capsule (300 mg total) by mouth 2 (two) times daily. 02/15/24 02/14/25  Dorothe Gaster, NP  hydrocortisone  ointment 0.5 % Apply 1 Application topically 2 (two) times daily. 12/01/23   Walisiewicz, Kaitlyn E, PA-C  ibuprofen (ADVIL) 800 MG tablet Take 1 tablet (800 mg total) by mouth 3 (three) times daily. 02/17/24   Albertus Hughs, DO  lidocaine -prilocaine  (EMLA ) cream Apply to the Port-A-Cath site 30-60 minutes before chemotherapy 12/14/23   Marlene Simas, MD  magnesium  oxide (MAG-OX) 400 (240 Mg) MG tablet Take 1 tablet (400 mg total) by  mouth 3 (three) times daily. 02/13/24 03/14/24  Marlene Simas, MD  metoCLOPramide  (REGLAN ) 5 MG tablet Take 1 tablet (5 mg total) by mouth every 8 (eight) hours as needed for nausea or vomiting. 02/05/24   Ozell Blunt, MD  ondansetron  (ZOFRAN ) 8 MG tablet Take 1 tablet (8 mg total) by mouth every 8 (eight) hours as needed for nausea or vomiting. Begin on the third day after cisplatin  chemotherapy. 11/10/23   Marlene Simas, MD  oxyCODONE  (OXY IR/ROXICODONE ) 5 MG immediate release tablet Take 1-2 tablets (5-10 mg total) by mouth every 4 (four) hours as needed for moderate pain (pain score 4-6). 10/06/23   Barrett, Erin R, PA-C  pantoprazole  (PROTONIX ) 40 MG tablet Take 1 tablet (40 mg total) by mouth daily at 12 noon. 02/06/24   Ozell Blunt, MD  potassium chloride  SA (KLOR-CON  M) 20 MEQ tablet Take 1 tablet (20 mEq total) by mouth daily. 02/13/24   Dorothe Gaster, NP  valACYclovir (VALTREX) 1000 MG tablet Take 1,000 mg by mouth 2 (two) times daily as needed (fever blisters/outbreaks). 04/05/23    [provider]  valACYclovir (VALTREX) 1000 MG tablet Take 1 tablet (1,000 mg total) by mouth 2 (two) times daily for 3 days. Start within 72 hours of the first signs of a flare. 04/05/23   Drusilla Gerlach, MD  chlorproMAZINE  (THORAZINE ) 25 MG tablet Take 1 tablet (25 mg total) by mouth 3 (three) times daily as needed for hiccoughs. 01/11/24 01/30/24  Marlene Simas, MD    Physical Exam: BP 98/64   Pulse 65   Temp (!) 97.5 F (36.4 C) (Oral)   Resp 10   Ht 5\' 11"  (1.803 m)   Wt 87.1 kg   SpO2 98%   BMI 26.78 kg/m  General:  Alert, oriented, calm, in no acute distress, his cousin is at the bedside.  He looks comfortable and nontoxic. Cardiovascular: RRR, no murmurs or rubs, no peripheral edema  Respiratory: clear to auscultation bilaterally, no wheezes, no crackles  Abdomen: soft, nontender, nondistended, normal bowel tones heard  Skin: dry, no rashes  Musculoskeletal: no joint effusions, normal range of motion  Psychiatric: appropriate affect, normal speech  Neurologic: extraocular muscles intact, clear speech, moving all extremities with intact sensorium         Labs on Admission:  Basic Metabolic Panel: Recent Labs  Lab 02/13/24 0800 02/17/24 1725 02/18/24 1036  NA 139 133* 130*  K 3.2* 4.1 4.2  CL 100 97* 97*  CO2 34* 27 27  GLUCOSE 117* 154* 172*  BUN 7* 12 18  CREATININE 1.08 1.20 1.44*  CALCIUM  8.3* 8.1* 7.6*  MG 1.6*  --  2.2   Liver Function Tests: Recent Labs  Lab 02/13/24 0800 02/17/24 1725 02/18/24 1036  AST 14* 21 16  ALT 11 10 10   ALKPHOS 79 73 66  BILITOT 0.3 0.3 1.0  PROT 5.2* 5.2* 4.7*  ALBUMIN  3.1* 2.5* 2.3*   No results for input(s): "LIPASE", "AMYLASE" in the last 168 hours. No results for input(s): "AMMONIA" in the last 168 hours. CBC: Recent Labs  Lab 02/13/24 0800 02/17/24 1725 02/18/24 1036  WBC 9.3 16.7* 18.1*  NEUTROABS 7.0 14.3* 16.0*  HGB 10.0* 9.5* 8.8*  HCT 29.8* 29.3* 27.3*  MCV 96.1 100.3* 104.2*  PLT 338 253 182    Cardiac Enzymes: No results for input(s): "CKTOTAL", "CKMB", "CKMBINDEX", "TROPONINI" in the last 168 hours. BNP (last 3 results) Recent Labs    02/09/24 2145  BNP 224.8*  ProBNP (last 3 results) No results for input(s): "PROBNP" in the last 8760 hours.  CBG: Recent Labs  Lab 02/18/24 0823  GLUCAP 145*    Radiological Exams on Admission: MR BRAIN WO CONTRAST Result Date: 02/18/2024 CLINICAL DATA:  Acute stroke suspected EXAM: MRI HEAD WITHOUT CONTRAST TECHNIQUE: Multiplanar, multiecho pulse sequences of the brain and surrounding structures were obtained without intravenous contrast. COMPARISON:  02/18/2024 head CT FINDINGS: Brain: No acute infarction, hemorrhage, hydrocephalus, extra-axial collection or mass lesion. Small chronic infarcts in the bilateral occipital parietal cortex. Mild chronic white matter disease. Preserved brain volume for age. Vascular: Normal flow voids. Skull and upper cervical spine: Normal marrow signal. Sinuses/Orbits: Mild patchy opacification of frontal ethmoidal sinuses, chronic. Bilateral cataract resection. IMPRESSION: No acute or interval finding. Small chronic bilateral parietooccipital infarcts. Electronically Signed   By: Ronnette Coke M.D.   On: 02/18/2024 12:42   CT Angio Chest PE W and/or Wo Contrast Result Date: 02/18/2024 CLINICAL DATA:  Sepsis.  Syncope.  History of lung cancer. EXAM: CT ANGIOGRAPHY CHEST CT ABDOMEN AND PELVIS WITH CONTRAST TECHNIQUE: Multidetector CT imaging of the chest was performed using the standard protocol during bolus administration of intravenous contrast. Multiplanar CT image reconstructions and MIPs were obtained to evaluate the vascular anatomy. Multidetector CT imaging of the abdomen and pelvis was performed using the standard protocol during bolus administration of intravenous contrast. RADIATION DOSE REDUCTION: This exam was performed according to the departmental dose-optimization program which includes  automated exposure control, adjustment of the Cameron and/or kV according to patient size and/or use of iterative reconstruction technique. CONTRAST:  OMNIPAQUE  IOHEXOL  350 MG/ML SOLN COMPARISON:  PET-CT 08/02/2023. FINDINGS: CTA CHEST FINDINGS Cardiovascular: Satisfactory opacification of the pulmonary arteries to the segmental level. No evidence of pulmonary embolism. Normal heart size. No pericardial effusion. Aortic atherosclerosis and multi vessel coronary artery calcifications. Mediastinum/Nodes: No enlarged mediastinal, hilar, or axillary lymph nodes. Thyroid  gland, trachea, and esophagus demonstrate no significant findings. Lungs/Pleura: Status post left upper lobectomy. Small left pleural effusion. Emphysema with diffuse bronchial wall thickening. No airspace consolidation, atelectasis or pneumothorax. Musculoskeletal: No chest wall abnormality. No acute or significant osseous findings. Review of the MIP images confirms the above findings. CT ABDOMEN and PELVIS FINDINGS Hepatobiliary: No focal liver abnormality. Status post cholecystectomy. No bile duct dilatation. Pancreas: Unremarkable. No pancreatic ductal dilatation or surrounding inflammatory changes. Spleen: Normal in size without focal abnormality. Adrenals/Urinary Tract: Normal adrenal glands. No nephrolithiasis or hydronephrosis. Cyst off the posterior cortex of left kidney measures 2.1 cm, image 34/5. No follow-up imaging recommended. There is asymmetric right-sided perinephric soft tissue stranding. Striated nephrograms identified within the upper and lower pole of the right kidney. No obstructive uropathy identified bilaterally. Thick walled urinary bladder is decompressed around a Foley catheter. Stomach/Bowel: Stomach is within normal limits. Status post appendectomy. No evidence of bowel wall thickening, distention, or inflammatory changes. Vascular/Lymphatic: Aortic atherosclerosis. 4.4 cm infrarenal abdominal aortic aneurysm, image 41/5.  No abdominopelvic adenopathy. Reproductive: Status post prostatectomy. Other: No significant free fluid or fluid collections. Musculoskeletal: No acute or significant osseous findings. Lumbar spondylosis. Schmorl's node deformity within the superior endplate of L4. Review of the MIP images confirms the above findings. IMPRESSION: 1. No evidence for acute pulmonary embolus. 2. Small left pleural effusion. 3. Emphysema with diffuse bronchial wall thickening. 4. There is asymmetric right-sided striated nephrograms with perinephric fat stranding. Correlate for clinical signs or symptoms of pyelonephritis. 5. Thick-walled urinary bladder decompressed around a Foley catheter. Correlate for any signs or  symptoms of cystitis. 6. 4.4 cm infrarenal abdominal aortic aneurysm. Recommend follow-up CT or MR as appropriate in 12 months and referral to or continued care with vascular specialist. (Ref.: J Vasc Surg. 2018; 67:2-77 and J Am Coll Radiol 2013;10(10):789-794.) 7. Coronary artery calcifications. 8. Aortic Atherosclerosis (ICD10-I70.0) and Emphysema (ICD10-J43.9). Electronically Signed   By: Kimberley Penman M.D.   On: 02/18/2024 12:28   CT ABDOMEN PELVIS W CONTRAST Result Date: 02/18/2024 CLINICAL DATA:  Sepsis.  Syncope.  History of lung cancer. EXAM: CT ANGIOGRAPHY CHEST CT ABDOMEN AND PELVIS WITH CONTRAST TECHNIQUE: Multidetector CT imaging of the chest was performed using the standard protocol during bolus administration of intravenous contrast. Multiplanar CT image reconstructions and MIPs were obtained to evaluate the vascular anatomy. Multidetector CT imaging of the abdomen and pelvis was performed using the standard protocol during bolus administration of intravenous contrast. RADIATION DOSE REDUCTION: This exam was performed according to the departmental dose-optimization program which includes automated exposure control, adjustment of the Cameron and/or kV according to patient size and/or use of iterative  reconstruction technique. CONTRAST:  OMNIPAQUE  IOHEXOL  350 MG/ML SOLN COMPARISON:  PET-CT 08/02/2023. FINDINGS: CTA CHEST FINDINGS Cardiovascular: Satisfactory opacification of the pulmonary arteries to the segmental level. No evidence of pulmonary embolism. Normal heart size. No pericardial effusion. Aortic atherosclerosis and multi vessel coronary artery calcifications. Mediastinum/Nodes: No enlarged mediastinal, hilar, or axillary lymph nodes. Thyroid  gland, trachea, and esophagus demonstrate no significant findings. Lungs/Pleura: Status post left upper lobectomy. Small left pleural effusion. Emphysema with diffuse bronchial wall thickening. No airspace consolidation, atelectasis or pneumothorax. Musculoskeletal: No chest wall abnormality. No acute or significant osseous findings. Review of the MIP images confirms the above findings. CT ABDOMEN and PELVIS FINDINGS Hepatobiliary: No focal liver abnormality. Status post cholecystectomy. No bile duct dilatation. Pancreas: Unremarkable. No pancreatic ductal dilatation or surrounding inflammatory changes. Spleen: Normal in size without focal abnormality. Adrenals/Urinary Tract: Normal adrenal glands. No nephrolithiasis or hydronephrosis. Cyst off the posterior cortex of left kidney measures 2.1 cm, image 34/5. No follow-up imaging recommended. There is asymmetric right-sided perinephric soft tissue stranding. Striated nephrograms identified within the upper and lower pole of the right kidney. No obstructive uropathy identified bilaterally. Thick walled urinary bladder is decompressed around a Foley catheter. Stomach/Bowel: Stomach is within normal limits. Status post appendectomy. No evidence of bowel wall thickening, distention, or inflammatory changes. Vascular/Lymphatic: Aortic atherosclerosis. 4.4 cm infrarenal abdominal aortic aneurysm, image 41/5. No abdominopelvic adenopathy. Reproductive: Status post prostatectomy. Other: No significant free fluid or  fluid collections. Musculoskeletal: No acute or significant osseous findings. Lumbar spondylosis. Schmorl's node deformity within the superior endplate of L4. Review of the MIP images confirms the above findings. IMPRESSION: 1. No evidence for acute pulmonary embolus. 2. Small left pleural effusion. 3. Emphysema with diffuse bronchial wall thickening. 4. There is asymmetric right-sided striated nephrograms with perinephric fat stranding. Correlate for clinical signs or symptoms of pyelonephritis. 5. Thick-walled urinary bladder decompressed around a Foley catheter. Correlate for any signs or symptoms of cystitis. 6. 4.4 cm infrarenal abdominal aortic aneurysm. Recommend follow-up CT or MR as appropriate in 12 months and referral to or continued care with vascular specialist. (Ref.: J Vasc Surg. 2018; 67:2-77 and J Am Coll Radiol 2013;10(10):789-794.) 7. Coronary artery calcifications. 8. Aortic Atherosclerosis (ICD10-I70.0) and Emphysema (ICD10-J43.9). Electronically Signed   By: Kimberley Penman M.D.   On: 02/18/2024 12:28   CT Head Wo Contrast Result Date: 02/18/2024 CLINICAL DATA:  Syncope/presyncope with cerebrovascular cause suspected EXAM: CT HEAD WITHOUT CONTRAST  TECHNIQUE: Contiguous axial images were obtained from the base of the skull through the vertex without intravenous contrast. RADIATION DOSE REDUCTION: This exam was performed according to the departmental dose-optimization program which includes automated exposure control, adjustment of the Cameron and/or kV according to patient size and/or use of iterative reconstruction technique. COMPARISON:  09/02/2023 brain MRI FINDINGS: Brain: No evidence of acute infarction, hemorrhage, hydrocephalus, extra-axial collection or mass lesion/mass effect. Small chronic infarct in the parasagittal right more than left parietooccipital cortex Vascular: No hyperdense vessel or unexpected calcification. Skull: Normal. Negative for fracture or focal lesion. Sinuses/Orbits:  No acute finding. 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 Electronically Signed   By: Ronnette Coke M.D.   On: 02/18/2024 11:58   DG Chest Port 1 View Result Date: 02/18/2024 CLINICAL DATA:  Sepsis.  Evaluate for abnormality. EXAM: PORTABLE CHEST 1 VIEW COMPARISON:  02/17/2024 FINDINGS: There is a right chest wall port a catheter with tip in the distal SVC. Aortic atherosclerosis. Stable cardiomediastinal contours. No pleural fluid, interstitial edema or airspace disease. IMPRESSION: No acute cardiopulmonary abnormalities. Electronically Signed   By: Kimberley Penman M.D.   On: 02/18/2024 09:52   DG Chest Port 1 View Result Date: 02/17/2024 EXAM: 1 VIEW XRAY OF THE CHEST 02/17/2024 05:31:00 PM COMPARISON: 02/09/2024 CLINICAL HISTORY: Questionable sepsis - evaluate for abnormality. FINDINGS: LUNGS AND PLEURA: No focal pulmonary opacity. No pulmonary edema. No pleural effusion. No pneumothorax. HEART AND MEDIASTINUM: No acute abnormality of the cardiac and mediastinal silhouettes. Sclerotic calcifications are present at the aortic arch. BONES AND SOFT TISSUES: No acute osseous abnormality. A right IJ port-a-cath is stable. IMPRESSION: 1. No acute findings. Electronically signed by: Audree Leas MD 02/17/2024 07:05 PM EDT RP Workstation: [id number]   Assessment/Plan Cameron [name] is a 73 y.o. male with medical history significant for COPD on room air, hyperlipidemia, hypertension, lung cancer recently completed chemotherapy being admitted to the hospital with fever and sepsis of unclear etiology.  Sepsis-meeting criteria with leukocytosis, hypotension, suspected bacterial infection though source is unclear at this time.  He does have an indwelling Foley catheter, which might explain his abnormal urinalysis.  He does have some right-sided perinephric stranding, though no associated CVA tenderness. -Inpatient admission -Follow-up blood cultures, follow-up urine  culture -Empiric broad-spectrum antibiotics to include IV vancomycin, IV cefepime, IV Flagyl  GERD-omeprazole  Hypertension-currently off antihypertensives  Hyperlipidemia-Lipitor  Neuropathy-gabapentin   COPD-comfortable on room air, without evidence of exacerbation -Continue Breo Ellipta  daily while in the hospital -Albuterol  neb as needed  DVT prophylaxis: Lovenox      Code Status: Full Code  Consults called: None, his oncologist Dr. Marguerita Shih was added to inpatient treatment team.  Admission status: The appropriate patient status for this patient is INPATIENT. Inpatient status is judged to be reasonable and necessary in order to provide the required intensity of service to ensure the patient's safety. The patient's presenting symptoms, physical exam findings, and initial radiographic and laboratory data in the context of their chronic comorbidities is felt to place them at high risk for further clinical deterioration. Furthermore, it is not anticipated that the patient will be medically stable for discharge from the hospital within 2 midnights of admission.    I certify that at the point of admission it is my clinical judgment that the patient will require inpatient hospital care spanning beyond 2 midnights from the point of admission due to high intensity of service, high risk for further deterioration and high frequency of surveillance required  Time spent:  48 minutes  Perpet[place] Elling Rickey Charm MD [facility] Pager [phone]  If 7PM-7AM, please contact night-coverage [email or web address] Password [email or web address]  02/18/2024, 2:05 PM

## 2024-02-18 NOTE — ED Notes (Signed)
 Patient transported to CT

## 2024-02-18 NOTE — ED Provider Notes (Signed)
 [place] [facility] AT [facility] Provider Note   CSN: [id number] Arrival date & time: 02/18/24  0757     History  Chief Complaint  Patient presents with   Near Syncope    Cameron [name] is a 73 y.o. male.   Near Syncope  Patient presents for syncope.  Medical history includes HTN, HLD, CAD, polycythemia, BPH, lung cancer, COPD.  He completed chemotherapy 3 weeks ago.  He was seen in the ED yesterday with complaint of fever and fatigue.  Lab work was notable for a leukocytosis.  He was started on Augmentin .  He states that he felt well at time of discharge from the ED last night.  This morning, he had worsened generalized weakness.  While in the bathroom, he had a syncopal episode.  He describes a prodrome of dizziness and lightheadedness.  He was able to grab onto a handrail and ease himself to the ground.  He did have a bowel movement while on the ground.  Currently, he endorses ongoing generalized weakness.  He denies any current areas of discomfort.     Home Medications Prior to Admission medications   Medication Sig Start Date End Date Taking? Authorizing Provider  acetaminophen  (TYLENOL ) 500 MG tablet Take 1-2 tablets (500-1,000 mg total) by mouth every 6 (six) hours as needed. 10/06/23   Barrett, Erin R, PA-C  amiodarone  (PACERONE ) 200 MG tablet Take 1 tablet (200 mg total) by mouth 2 (two) times daily for 10 days, then decrease to 1 tablet (200 mg) daily. Patient not taking: Reported on 02/02/2024 10/06/23 11/25/23  Barrett, Malachy Scripture, PA-C  amoxicillin -clavulanate (AUGMENTIN ) 875-125 MG tablet Take 1 tablet by mouth every 12 (twelve) hours. 02/17/24   Albertus Hughs, DO  Ascorbic Acid (VITAMIN C PO) Take 1 tablet by mouth in the morning. Patient not taking: Reported on 02/02/2024    [provider]  atorvastatin  (LIPITOR) 20 MG tablet Take 1 tablet (20 mg total) by mouth daily. 12/21/23   Dorothe Gaster, NP  dexamethasone  (DECADRON ) 4 MG tablet Take 2 tabs (8  mg) twice daily starting the day before docetaxel , then daily x 3 days after cisplatin .Take with food. 11/10/23   Marlene Simas, MD  dorzolamide  (TRUSOPT ) 2 % ophthalmic solution Administer into right eye twice a day. 02/02/24     fluticasone  (FLONASE ) 50 MCG/ACT nasal spray Place 1 spray into both nostrils daily.    [provider]  fluticasone -salmeterol (ADVAIR  DISKUS) 250-50 MCG/ACT AEPB Inhale 1 puff into the lungs in the morning and at bedtime. 12/22/23   Dorothe Gaster, NP  gabapentin  (NEURONTIN ) 300 MG capsule Take 1 capsule (300 mg total) by mouth 2 (two) times daily. 02/15/24 02/14/25  Dorothe Gaster, NP  hydrocortisone  ointment 0.5 % Apply 1 Application topically 2 (two) times daily. 12/01/23   Walisiewicz, Kaitlyn E, PA-C  ibuprofen (ADVIL) 800 MG tablet Take 1 tablet (800 mg total) by mouth 3 (three) times daily. 02/17/24   Albertus Hughs, DO  lidocaine -prilocaine  (EMLA ) cream Apply to the Port-A-Cath site 30-60 minutes before chemotherapy 12/14/23   Marlene Simas, MD  magnesium  oxide (MAG-OX) 400 (240 Mg) MG tablet Take 1 tablet (400 mg total) by mouth 3 (three) times daily. 02/13/24 03/14/24  Marlene Simas, MD  metoCLOPramide  (REGLAN ) 5 MG tablet Take 1 tablet (5 mg total) by mouth every 8 (eight) hours as needed for nausea or vomiting. 02/05/24   Ozell Blunt, MD  ondansetron  (ZOFRAN ) 8 MG tablet Take 1 tablet (  8 mg total) by mouth every 8 (eight) hours as needed for nausea or vomiting. Begin on the third day after cisplatin  chemotherapy. 11/10/23   Marlene Simas, MD  oxyCODONE  (OXY IR/ROXICODONE ) 5 MG immediate release tablet Take 1-2 tablets (5-10 mg total) by mouth every 4 (four) hours as needed for moderate pain (pain score 4-6). 10/06/23   Barrett, Erin R, PA-C  pantoprazole  (PROTONIX ) 40 MG tablet Take 1 tablet (40 mg total) by mouth daily at 12 noon. 02/06/24   Ozell Blunt, MD  potassium chloride  SA (KLOR-CON  M) 20 MEQ tablet Take 1 tablet (20 mEq total) by mouth daily. 02/13/24    Dorothe Gaster, NP  valACYclovir (VALTREX) 1000 MG tablet Take 1,000 mg by mouth 2 (two) times daily as needed (fever blisters/outbreaks). 04/05/23   [provider]  valACYclovir (VALTREX) 1000 MG tablet Take 1 tablet (1,000 mg total) by mouth 2 (two) times daily for 3 days. Start within 72 hours of the first signs of a flare. 04/05/23   Drusilla Gerlach, MD  chlorproMAZINE  (THORAZINE ) 25 MG tablet Take 1 tablet (25 mg total) by mouth 3 (three) times daily as needed for hiccoughs. 01/11/24 01/30/24  Marlene Simas, MD      Allergies    Compazine  [prochlorperazine ]    Review of Systems   Review of Systems  Constitutional:  Positive for fatigue and fever.  Cardiovascular:  Positive for near-syncope.  Neurological:  Positive for syncope and weakness (Generalized).  All other systems reviewed and are negative.   Physical Exam Updated Vital Signs BP 98/69   Pulse 65   Temp (!) 97.5 F (36.4 C) (Oral)   Resp 10   Ht 5\' 11"  (1.803 m)   Wt 87.1 kg   SpO2 96%   BMI 26.78 kg/m  Physical Exam Vitals and nursing note reviewed.  Constitutional:      General: He is not in acute distress.    Appearance: Normal appearance. He is well-developed. He is not ill-appearing, toxic-appearing or diaphoretic.  HENT:     Head: Normocephalic and atraumatic.     Right Ear: External ear normal.     Left Ear: External ear normal.     Nose: Nose normal.     Mouth/Throat:     Mouth: Mucous membranes are moist.  Eyes:     Extraocular Movements: Extraocular movements intact.     Conjunctiva/sclera: Conjunctivae normal.  Cardiovascular:     Rate and Rhythm: Normal rate and regular rhythm.     Heart sounds: No murmur heard. Pulmonary:     Effort: Pulmonary effort is normal. No respiratory distress.     Breath sounds: Decreased breath sounds present. No wheezing or rales.  Abdominal:     General: There is no distension.     Palpations: Abdomen is soft.     Tenderness: There is no abdominal  tenderness.  Musculoskeletal:        General: No swelling. Normal range of motion.     Cervical back: Normal range of motion and neck supple.     Right lower leg: No edema.     Left lower leg: No edema.  Skin:    General: Skin is warm and dry.     Capillary Refill: Capillary refill takes less than 2 seconds.     Coloration: Skin is not jaundiced or pale.  Neurological:     Mental Status: He is alert and oriented to person, place, and time.     Sensory: No sensory deficit.  Motor: No weakness.     Coordination: Coordination normal.     Comments: Slight facial asymmetry.  Psychiatric:        Mood and Affect: Mood normal.        Behavior: Behavior normal.     ED Results / Procedures / Treatments   Labs (all labs ordered are listed, but only abnormal results are displayed) Labs Reviewed  COMPREHENSIVE METABOLIC PANEL WITH GFR - Abnormal; Notable for the following components:      Result Value   Sodium 130 (*)    Chloride 97 (*)    Glucose, Bld 172 (*)    Creatinine, Ser 1.44 (*)    Calcium  7.6 (*)    Total Protein 4.7 (*)    Albumin  2.3 (*)    GFR, Estimated 52 (*)    All other components within normal limits  CBC WITH DIFFERENTIAL/PLATELET - Abnormal; Notable for the following components:   WBC 18.1 (*)    RBC 2.62 (*)    Hemoglobin 8.8 (*)    HCT 27.3 (*)    MCV 104.2 (*)    RDW 18.8 (*)    Neutro Abs 16.0 (*)    Monocytes Absolute 1.2 (*)    Abs Immature Granulocytes 0.17 (*)    All other components within normal limits  URINALYSIS, W/ REFLEX TO CULTURE (INFECTION SUSPECTED) - Abnormal; Notable for the following components:   APPearance HAZY (*)    Hgb urine dipstick SMALL (*)    Protein, ur 30 (*)    Leukocytes,Ua LARGE (*)    All other components within normal limits  CBG MONITORING, ED - Abnormal; Notable for the following components:   Glucose-Capillary 145 (*)    All other components within normal limits  TROPONIN I (HIGH SENSITIVITY) - Abnormal;  Notable for the following components:   Troponin I (High Sensitivity) 43 (*)    All other components within normal limits  TROPONIN I (HIGH SENSITIVITY) - Abnormal; Notable for the following components:   Troponin I (High Sensitivity) 42 (*)    All other components within normal limits  RESP PANEL BY RT-PCR (RSV, FLU A&B, COVID)  RVPGX2  URINE CULTURE  MAGNESIUM   PROTIME-INR  I-STAT CG4 LACTIC ACID, ED  I-STAT CG4 LACTIC ACID, ED    EKG None  Radiology MR BRAIN WO CONTRAST Result Date: 02/18/2024 CLINICAL DATA:  Acute stroke suspected EXAM: MRI HEAD WITHOUT CONTRAST TECHNIQUE: Multiplanar, multiecho pulse sequences of the brain and surrounding structures were obtained without intravenous contrast. COMPARISON:  02/18/2024 head CT FINDINGS: Brain: No acute infarction, hemorrhage, hydrocephalus, extra-axial collection or mass lesion. Small chronic infarcts in the bilateral occipital parietal cortex. Mild chronic white matter disease. Preserved brain volume for age. Vascular: Normal flow voids. Skull and upper cervical spine: Normal marrow signal. Sinuses/Orbits: Mild patchy opacification of frontal ethmoidal sinuses, chronic. Bilateral cataract resection. IMPRESSION: No acute or interval finding. Small chronic bilateral parietooccipital infarcts. Electronically Signed   By: Ronnette Coke M.D.   On: 02/18/2024 12:42   CT Angio Chest PE W and/or Wo Contrast Result Date: 02/18/2024 CLINICAL DATA:  Sepsis.  Syncope.  History of lung cancer. EXAM: CT ANGIOGRAPHY CHEST CT ABDOMEN AND PELVIS WITH CONTRAST TECHNIQUE: Multidetector CT imaging of the chest was performed using the standard protocol during bolus administration of intravenous contrast. Multiplanar CT image reconstructions and MIPs were obtained to evaluate the vascular anatomy. Multidetector CT imaging of the abdomen and pelvis was performed using the standard protocol during bolus administration of intravenous  contrast. RADIATION DOSE  REDUCTION: This exam was performed according to the departmental dose-optimization program which includes automated exposure control, adjustment of the mA and/or kV according to patient size and/or use of iterative reconstruction technique. CONTRAST:  OMNIPAQUE  IOHEXOL  350 MG/ML SOLN COMPARISON:  PET-CT 08/02/2023. FINDINGS: CTA CHEST FINDINGS Cardiovascular: Satisfactory opacification of the pulmonary arteries to the segmental level. No evidence of pulmonary embolism. Normal heart size. No pericardial effusion. Aortic atherosclerosis and multi vessel coronary artery calcifications. Mediastinum/Nodes: No enlarged mediastinal, hilar, or axillary lymph nodes. Thyroid  gland, trachea, and esophagus demonstrate no significant findings. Lungs/Pleura: Status post left upper lobectomy. Small left pleural effusion. Emphysema with diffuse bronchial wall thickening. No airspace consolidation, atelectasis or pneumothorax. Musculoskeletal: No chest wall abnormality. No acute or significant osseous findings. Review of the MIP images confirms the above findings. CT ABDOMEN and PELVIS FINDINGS Hepatobiliary: No focal liver abnormality. Status post cholecystectomy. No bile duct dilatation. Pancreas: Unremarkable. No pancreatic ductal dilatation or surrounding inflammatory changes. Spleen: Normal in size without focal abnormality. Adrenals/Urinary Tract: Normal adrenal glands. No nephrolithiasis or hydronephrosis. Cyst off the posterior cortex of left kidney measures 2.1 cm, image 34/5. No follow-up imaging recommended. There is asymmetric right-sided perinephric soft tissue stranding. Striated nephrograms identified within the upper and lower pole of the right kidney. No obstructive uropathy identified bilaterally. Thick walled urinary bladder is decompressed around a Foley catheter. Stomach/Bowel: Stomach is within normal limits. Status post appendectomy. No evidence of bowel wall thickening, distention, or inflammatory  changes. Vascular/Lymphatic: Aortic atherosclerosis. 4.4 cm infrarenal abdominal aortic aneurysm, image 41/5. No abdominopelvic adenopathy. Reproductive: Status post prostatectomy. Other: No significant free fluid or fluid collections. Musculoskeletal: No acute or significant osseous findings. Lumbar spondylosis. Schmorl's node deformity within the superior endplate of L4. Review of the MIP images confirms the above findings. IMPRESSION: 1. No evidence for acute pulmonary embolus. 2. Small left pleural effusion. 3. Emphysema with diffuse bronchial wall thickening. 4. There is asymmetric right-sided striated nephrograms with perinephric fat stranding. Correlate for clinical signs or symptoms of pyelonephritis. 5. Thick-walled urinary bladder decompressed around a Foley catheter. Correlate for any signs or symptoms of cystitis. 6. 4.4 cm infrarenal abdominal aortic aneurysm. Recommend follow-up CT or MR as appropriate in 12 months and referral to or continued care with vascular specialist. (Ref.: J Vasc Surg. 2018; 67:2-77 and J Am Coll Radiol 2013;10(10):789-794.) 7. Coronary artery calcifications. 8. Aortic Atherosclerosis (ICD10-I70.0) and Emphysema (ICD10-J43.9). Electronically Signed   By: Kimberley Penman M.D.   On: 02/18/2024 12:28   CT ABDOMEN PELVIS W CONTRAST Result Date: 02/18/2024 CLINICAL DATA:  Sepsis.  Syncope.  History of lung cancer. EXAM: CT ANGIOGRAPHY CHEST CT ABDOMEN AND PELVIS WITH CONTRAST TECHNIQUE: Multidetector CT imaging of the chest was performed using the standard protocol during bolus administration of intravenous contrast. Multiplanar CT image reconstructions and MIPs were obtained to evaluate the vascular anatomy. Multidetector CT imaging of the abdomen and pelvis was performed using the standard protocol during bolus administration of intravenous contrast. RADIATION DOSE REDUCTION: This exam was performed according to the departmental dose-optimization program which includes automated  exposure control, adjustment of the mA and/or kV according to patient size and/or use of iterative reconstruction technique. CONTRAST:  OMNIPAQUE  IOHEXOL  350 MG/ML SOLN COMPARISON:  PET-CT 08/02/2023. FINDINGS: CTA CHEST FINDINGS Cardiovascular: Satisfactory opacification of the pulmonary arteries to the segmental level. No evidence of pulmonary embolism. Normal heart size. No pericardial effusion. Aortic atherosclerosis and multi vessel coronary artery calcifications. Mediastinum/Nodes: No enlarged mediastinal, hilar, or  axillary lymph nodes. Thyroid  gland, trachea, and esophagus demonstrate no significant findings. Lungs/Pleura: Status post left upper lobectomy. Small left pleural effusion. Emphysema with diffuse bronchial wall thickening. No airspace consolidation, atelectasis or pneumothorax. Musculoskeletal: No chest wall abnormality. No acute or significant osseous findings. Review of the MIP images confirms the above findings. CT ABDOMEN and PELVIS FINDINGS Hepatobiliary: No focal liver abnormality. Status post cholecystectomy. No bile duct dilatation. Pancreas: Unremarkable. No pancreatic ductal dilatation or surrounding inflammatory changes. Spleen: Normal in size without focal abnormality. Adrenals/Urinary Tract: Normal adrenal glands. No nephrolithiasis or hydronephrosis. Cyst off the posterior cortex of left kidney measures 2.1 cm, image 34/5. No follow-up imaging recommended. There is asymmetric right-sided perinephric soft tissue stranding. Striated nephrograms identified within the upper and lower pole of the right kidney. No obstructive uropathy identified bilaterally. Thick walled urinary bladder is decompressed around a Foley catheter. Stomach/Bowel: Stomach is within normal limits. Status post appendectomy. No evidence of bowel wall thickening, distention, or inflammatory changes. Vascular/Lymphatic: Aortic atherosclerosis. 4.4 cm infrarenal abdominal aortic aneurysm, image 41/5. No  abdominopelvic adenopathy. Reproductive: Status post prostatectomy. Other: No significant free fluid or fluid collections. Musculoskeletal: No acute or significant osseous findings. Lumbar spondylosis. Schmorl's node deformity within the superior endplate of L4. Review of the MIP images confirms the above findings. IMPRESSION: 1. No evidence for acute pulmonary embolus. 2. Small left pleural effusion. 3. Emphysema with diffuse bronchial wall thickening. 4. There is asymmetric right-sided striated nephrograms with perinephric fat stranding. Correlate for clinical signs or symptoms of pyelonephritis. 5. Thick-walled urinary bladder decompressed around a Foley catheter. Correlate for any signs or symptoms of cystitis. 6. 4.4 cm infrarenal abdominal aortic aneurysm. Recommend follow-up CT or MR as appropriate in 12 months and referral to or continued care with vascular specialist. (Ref.: J Vasc Surg. 2018; 67:2-77 and J Am Coll Radiol 2013;10(10):789-794.) 7. Coronary artery calcifications. 8. Aortic Atherosclerosis (ICD10-I70.0) and Emphysema (ICD10-J43.9). Electronically Signed   By: Kimberley Penman M.D.   On: 02/18/2024 12:28   CT Head Wo Contrast Result Date: 02/18/2024 CLINICAL DATA:  Syncope/presyncope with cerebrovascular cause suspected EXAM: CT HEAD WITHOUT CONTRAST TECHNIQUE: Contiguous axial images were obtained from the base of the skull through the vertex without intravenous contrast. RADIATION DOSE REDUCTION: This exam was performed according to the departmental dose-optimization program which includes automated exposure control, adjustment of the mA and/or kV according to patient size and/or use of iterative reconstruction technique. COMPARISON:  09/02/2023 brain MRI FINDINGS: Brain: No evidence of acute infarction, hemorrhage, hydrocephalus, extra-axial collection or mass lesion/mass effect. Small chronic infarct in the parasagittal right more than left parietooccipital cortex Vascular: No hyperdense  vessel or unexpected calcification. Skull: Normal. Negative for fracture or focal lesion. Sinuses/Orbits: No acute finding. 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 Electronically Signed   By: Ronnette Coke M.D.   On: 02/18/2024 11:58   DG Chest Port 1 View Result Date: 02/18/2024 CLINICAL DATA:  Sepsis.  Evaluate for abnormality. EXAM: PORTABLE CHEST 1 VIEW COMPARISON:  02/17/2024 FINDINGS: There is a right chest wall port a catheter with tip in the distal SVC. Aortic atherosclerosis. Stable cardiomediastinal contours. No pleural fluid, interstitial edema or airspace disease. IMPRESSION: No acute cardiopulmonary abnormalities. Electronically Signed   By: Kimberley Penman M.D.   On: 02/18/2024 09:52   DG Chest Port 1 View Result Date: 02/17/2024 EXAM: 1 VIEW XRAY OF THE CHEST 02/17/2024 05:31:00 PM COMPARISON: 02/09/2024 CLINICAL HISTORY: Questionable sepsis - evaluate for abnormality. FINDINGS: LUNGS AND PLEURA: No  focal pulmonary opacity. No pulmonary edema. No pleural effusion. No pneumothorax. HEART AND MEDIASTINUM: No acute abnormality of the cardiac and mediastinal silhouettes. Sclerotic calcifications are present at the aortic arch. BONES AND SOFT TISSUES: No acute osseous abnormality. A right IJ port-a-cath is stable. IMPRESSION: 1. No acute findings. Electronically signed by: Audree Leas MD 02/17/2024 07:05 PM EDT RP Workstation: [id number]    Procedures Procedures    Medications Ordered in ED Medications  lactated ringers  infusion ( Intravenous New Bag/Given 02/18/24 1305)  lactated ringers  bolus 1,000 mL (1,000 mLs Intravenous New Bag/Given 02/18/24 0831)  lactated ringers  bolus 1,000 mL (1,000 mLs Intravenous New Bag/Given 02/18/24 0912)  ceFEPIme (MAXIPIME) 2 g in sodium chloride  0.9 % 100 mL IVPB (2 g Intravenous New Bag/Given 02/18/24 0911)  metroNIDAZOLE (FLAGYL) IVPB 500 mg (500 mg Intravenous New Bag/Given 02/18/24 0910)  vancomycin  (VANCOCIN) IVPB 1000 mg/200 mL premix (1,000 mg Intravenous New Bag/Given 02/18/24 0910)  iohexol  (OMNIPAQUE ) 350 MG/ML injection 100 mL (100 mLs Intravenous Contrast Given 02/18/24 1135)    ED Course/ Medical Decision Making/ A&P                                 Medical Decision Making Amount and/or Complexity of Data Reviewed Labs: ordered. Radiology: ordered.  Risk Prescription drug management. Decision regarding hospitalization.   This patient presents to the ED for concern of syncope, this involves an extensive number of treatment options, and is a complaint that carries with it a high risk of complications and morbidity.  The differential diagnosis includes arrhythmia, vasovagal episode, dehydration, anemia, infection, metabolic derangements, polypharmacy, TIA, seizure   Co morbidities that complicate the patient evaluation  HTN, HLD, CAD, polycythemia, BPH, lung cancer, COPD   Additional history obtained:  Additional history obtained from patient's wife External records from outside source obtained and reviewed including EMR   Lab Tests:  I Ordered, and personally interpreted labs.  The pertinent results include: Creatinine is mildly increased from baseline, leukocytosis is present.  Hemoglobin has down trended slightly from prior lab work.  Urinalysis does show pyuria without evidence of bacteria.  Troponin is slightly elevated but stable on repeat.   Imaging Studies ordered:  I ordered imaging studies including chest x-ray, CT head, CTA chest, CT of abdomen and pelvis, MRI brain I independently visualized and interpreted imaging which showed asymmetric right-sided perinephric fat stranding concerning for pyelonephritis.  No other acute findings. I agree with the radiologist interpretation   Cardiac Monitoring: / EKG:  The patient was maintained on a cardiac monitor.  I personally viewed and interpreted the cardiac monitored which showed an underlying rhythm of:  Sinus rhythm   Problem List / ED Course / Critical interventions / Medication management  Patient presents with worsening generalized weakness, leading to an episode of syncope this morning.  On arrival in the ED, he is alert and oriented.  Vital signs are notable for hypotension with maps in the range of 65.  He was given 500 cc of IVF prior to arrival.  Additional IV fluids were started.  On exam, patient is overall well-appearing.  He denies any current areas of discomfort.  His breathing is unlabored.  He does have diminished bibasilar breath sounds.  He does have slight facial asymmetry which he states is normal to him and attributes to his dentures.  He does feel like his speech is slightly slurred at this time.  He has no  other focal deficits on exam.  Presentation not consistent with LVO.  Workup was initiated.  Patient's lab work shows redemonstration of leukocytosis.  Although his troponin is mildly elevated, it is stable on repeat.  His urinalysis shows what appears to be sterile pyuria.  However, CT imaging does show concern of right-sided pyelonephritis.  There is no other clear source of his infection at this time.  On MRI brain, no acute findings were identified.  On reassessment, patient's blood pressure has improved after IV fluids.  He remains with maps in the 70s to 80s.  He is resting comfortably.  His family is at bedside.  Patient was admitted for further management. I ordered medication including IV fluids and broad-spectrum antibiotics for empiric treatment of sepsis Reevaluation of the patient after these medicines showed that the patient improved I have reviewed the patients home medicines and have made adjustments as needed  Social Determinants of Health:  Lives at home with family        Final Clinical Impression(s) / ED Diagnoses Final diagnoses:  Syncope and collapse  Hypotension, unspecified hypotension type    Rx / DC Orders ED Discharge Orders     None          Iva Mariner, MD 02/18/24 1349

## 2024-02-18 NOTE — ED Triage Notes (Addendum)
 BIB EMS after syncopal episode in bathroom this morning. Pt was seen and worked up yesterday due to reported fever and similar event with syncope. 100/60-90-95% RA- CBG 167 # 18 left FA 500 ml NS Tylenol  at 6:30.

## 2024-02-18 NOTE — Progress Notes (Signed)
 Pharmacy Antibiotic Note  Cameron [name] is a 73 y.o. male admitted on 02/18/2024 with sepsis.  Pharmacy has been consulted for Vancomycin and Cefepime dosing.  Plan: Cefepime 2g IV q12h Vancomycin 1500 mg IV q24h  (SCr 1.44, est AUC 513) Measure Vanc levels as needed.  Goal AUC = 400 - 550 Follow up renal function, culture results, and clinical course.   Height: 5\' 11"  (180.3 cm) Weight: 87.1 kg (192 lb) IBW/kg (Calculated) : 75.3  Temp (24hrs), Avg:98.3 F (36.8 C), Min:97.5 F (36.4 C), Max:99.1 F (37.3 C)  Recent Labs  Lab 02/13/24 0800 02/17/24 1725 02/17/24 1736 02/17/24 2002 02/18/24 1036 02/18/24 1043  WBC 9.3 16.7*  --   --  18.1*  --   CREATININE 1.08 1.20  --   --  1.44*  --   LATICACIDVEN  --   --  2.0* 1.1  --  1.2    Estimated Creatinine Clearance: 50.8 mL/min (A) (by C-G formula based on SCr of 1.44 mg/dL (H)).    Allergies  Allergen Reactions   Compazine  [Prochlorperazine ] Diarrhea    syncope    Antimicrobials this admission: 5/24 Cefepime >> 5/24 Metronidazole >> 5/24 Vancomycin >>  Dose adjustments this admission:   Microbiology results: 5/24 BCx: 5/24 UCx: 5/24 Resp panel: neg 5/24 MRSA PCR:    Thank you for allowing pharmacy to be a part of this patient's care.  Kendall Pauls PharmD, BCPS [facility] main pharmacy [phone] 02/18/2024 2:14 PM

## 2024-02-18 NOTE — Progress Notes (Signed)
 PHARMACY - PHYSICIAN COMMUNICATION CRITICAL VALUE ALERT - BLOOD CULTURE IDENTIFICATION (BCID)  Cameron [name] is an 73 y.o. male who presented to [facility] on 02/18/2024 with a chief complaint of sepsis.  He was recently seen on 5/23 in ED for fever and discharged on Augmentin .  He returned on 5/24 with sepsis.    Assessment:  Blood and Urine cultures from ED visit on 5/23 are now growing pseudomonas.  5/23 UCx: > 100k Pseudomonas aeruginosa 5/23 BCx: GNR --> 5/24  BCID: Pseudomonas aeruginosa   Name of physician (or Provider) Contacted: Dr Jannette Mend  Current antibiotics: Vancomycin, Cefepime, metronidazole   Changes to prescribed antibiotics recommended:  Recommendations accepted by provider De-escalate to Cefepime alone  Results for orders placed or performed during the hospital encounter of 02/17/24  Blood Culture ID Panel (Reflexed) (Collected: 02/17/2024  5:25 PM)  Result Value Ref Range   Enterococcus faecalis NOT DETECTED NOT DETECTED   Enterococcus Faecium NOT DETECTED NOT DETECTED   Listeria monocytogenes NOT DETECTED NOT DETECTED   Staphylococcus species NOT DETECTED NOT DETECTED   Staphylococcus aureus (BCID) NOT DETECTED NOT DETECTED   Staphylococcus epidermidis NOT DETECTED NOT DETECTED   Staphylococcus lugdunensis NOT DETECTED NOT DETECTED   Streptococcus species NOT DETECTED NOT DETECTED   Streptococcus agalactiae NOT DETECTED NOT DETECTED   Streptococcus pneumoniae NOT DETECTED NOT DETECTED   Streptococcus pyogenes NOT DETECTED NOT DETECTED   A.calcoaceticus-baumannii NOT DETECTED NOT DETECTED   Bacteroides fragilis NOT DETECTED NOT DETECTED   Enterobacterales NOT DETECTED NOT DETECTED   Enterobacter cloacae complex NOT DETECTED NOT DETECTED   Escherichia coli NOT DETECTED NOT DETECTED   Klebsiella aerogenes NOT DETECTED NOT DETECTED   Klebsiella oxytoca NOT DETECTED NOT DETECTED   Klebsiella pneumoniae NOT DETECTED NOT DETECTED   Proteus species NOT DETECTED  NOT DETECTED   Salmonella species NOT DETECTED NOT DETECTED   Serratia marcescens NOT DETECTED NOT DETECTED   Haemophilus influenzae NOT DETECTED NOT DETECTED   Neisseria meningitidis NOT DETECTED NOT DETECTED   Pseudomonas aeruginosa DETECTED (A) NOT DETECTED   Stenotrophomonas maltophilia NOT DETECTED NOT DETECTED   Candida albicans NOT DETECTED NOT DETECTED   Candida auris NOT DETECTED NOT DETECTED   Candida glabrata NOT DETECTED NOT DETECTED   Candida krusei NOT DETECTED NOT DETECTED   Candida parapsilosis NOT DETECTED NOT DETECTED   Candida tropicalis NOT DETECTED NOT DETECTED   Cryptococcus neoformans/gattii NOT DETECTED NOT DETECTED   CTX-M ESBL NOT DETECTED NOT DETECTED   Carbapenem resistance IMP NOT DETECTED NOT DETECTED   Carbapenem resistance KPC NOT DETECTED NOT DETECTED   Carbapenem resistance NDM NOT DETECTED NOT DETECTED   Carbapenem resistance VIM NOT DETECTED NOT DETECTED    Kendall Pauls PharmD, BCPS [facility] pharmacy [phone] 02/18/2024 4:31 PM

## 2024-02-19 DIAGNOSIS — R6510 Systemic inflammatory response syndrome (SIRS) of non-infectious origin without acute organ dysfunction: Secondary | ICD-10-CM | POA: Diagnosis present

## 2024-02-19 LAB — BASIC METABOLIC PANEL WITH GFR
Anion gap: 7 (ref 5–15)
BUN: 18 mg/dL (ref 8–23)
CO2: 26 mmol/L (ref 22–32)
Calcium: 7.9 mg/dL — ABNORMAL LOW (ref 8.9–10.3)
Chloride: 99 mmol/L (ref 98–111)
Creatinine, Ser: 1.31 mg/dL — ABNORMAL HIGH (ref 0.61–1.24)
GFR, Estimated: 59 mL/min — ABNORMAL LOW (ref >60)
Glucose, Bld: 115 mg/dL — ABNORMAL HIGH (ref 70–99)
Potassium: 3.7 mmol/L (ref 3.5–5.1)
Sodium: 132 mmol/L — ABNORMAL LOW (ref 135–145)

## 2024-02-19 LAB — URINE CULTURE: Culture: >=100000 — AB

## 2024-02-19 LAB — CBC
HCT: 27.4 % — ABNORMAL LOW (ref 39.0–52.0)
Hemoglobin: 8.8 g/dL — ABNORMAL LOW (ref 13.0–17.0)
MCH: 32.5 pg (ref 26.0–34.0)
MCHC: 32.1 g/dL (ref 30.0–36.0)
MCV: 101.1 fL — ABNORMAL HIGH (ref 80.0–100.0)
Platelets: 181 10*3/uL (ref 150–400)
RBC: 2.71 MIL/uL — ABNORMAL LOW (ref 4.22–5.81)
RDW: 18.6 % — ABNORMAL HIGH (ref 11.5–15.5)
WBC: 9.8 10*3/uL (ref 4.0–10.5)
nRBC: 0 % (ref 0.0–0.2)

## 2024-02-19 MED ORDER — GUAIFENESIN 100 MG/5ML PO LIQD: 5.0000 mL | ORAL | Status: DC | PRN

## 2024-02-19 MED ORDER — IPRATROPIUM-ALBUTEROL 0.5-2.5 (3) MG/3ML IN SOLN: 3.0000 mL | RESPIRATORY_TRACT | Status: DC | PRN

## 2024-02-19 MED ORDER — SENNOSIDES-DOCUSATE SODIUM 8.6-50 MG PO TABS: 1.0000 | ORAL_TABLET | Freq: Every evening | ORAL | Status: DC | PRN

## 2024-02-19 MED ORDER — METOPROLOL TARTRATE 5 MG/5ML IV SOLN: 5.0000 mg | INTRAVENOUS | Status: DC | PRN

## 2024-02-19 MED ORDER — FLUTICASONE PROPIONATE 50 MCG/ACT NA SUSP
1.0000 | Freq: Two times a day (BID) | NASAL | Status: DC | PRN
Start: 2024-02-19 — End: 2024-02-21

## 2024-02-19 MED ORDER — HYDRALAZINE HCL 20 MG/ML IJ SOLN: 10.0000 mg | INTRAMUSCULAR | Status: DC | PRN

## 2024-02-19 MED ORDER — LIDOCAINE-PRILOCAINE 2.5-2.5 % EX CREA: 1.0000 | TOPICAL_CREAM | CUTANEOUS | Status: DC | PRN

## 2024-02-19 NOTE — Plan of Care (Signed)

## 2024-02-19 NOTE — Hospital Course (Addendum)
 Brief Narrative:  73 year old with history of COPD, HLD, HTN, lung cancer recently completed chemotherapy admitted for fever of unknown etiology.  Came to the ER day prior to admission and was discharged with empiric Augmentin  but then he had a syncopal episode therefore ended up coming back to the hospital. Of note he developed urinary retention on 5/16 and has had Foley catheter in place since.   Assessment & Plan:  Principal Problem:   SIRS (systemic inflammatory response syndrome) (HCC)     Sepsis secondary to urinary tract infection UTI, catheter associated - SePSIS secondary to urinary tract infection.  Urine cultures from recent ER visit and during this admission both are growing Pseudomonas.  We can continue IV cefepime for now and upon discharge we can transition to p.o. Cipro  -Suspect catheter associated UTI.  Had Foley PTA from recent ER visit from 9 days ago for urinary retention.  Will discontinue Foley catheter and monitor here for urinary retention   GERD -omeprazole   Hypertension -currently off antihypertensives.  IV as needed   Hyperlipidemia -Lipitor   Neuropathy -gabapentin    COPD Stage II non-small cell lung cancer -Home bronchodilators.  I-S/flutter valve -Recently completed chemotherapy follows with Dr. Liam Redhead  PT/OT-home health, face-to-face completed  DVT prophylaxis: Lovenox     Code Status: Full Code Family Communication: Spouse at bedside Status is: Inpatient Remains inpatient appropriate because: Hopefully discharge in next 24 hours   Subjective: Feels a little better Spouse is at bedside  Examination:  General exam: Appears calm and comfortable  Respiratory system: Clear to auscultation. Respiratory effort normal. Cardiovascular system: S1 & S2 heard, RRR. No JVD, murmurs, rubs, gallops or clicks. No pedal edema. Gastrointestinal system: Abdomen is nondistended, soft and nontender. No organomegaly or masses felt. Normal bowel sounds  heard. Central nervous system: Alert and oriented. No focal neurological deficits. Extremities: Symmetric 5 x 5 power. Skin: No rashes, lesions or ulcers Psychiatry: Judgement and insight appear normal. Mood & affect appropriate.

## 2024-02-19 NOTE — Progress Notes (Signed)
 PROGRESS NOTE    Cameron [name]  JXB:[id number] DOB: 1951-06-05 DOA: 02/18/2024 PCP: Dorothe Gaster, NP    Brief Narrative:  73 year old with history of COPD, HLD, HTN, lung cancer recently completed chemotherapy admitted for fever of unknown etiology.  Came to the ER day prior to admission and was discharged with empiric Augmentin  but then he had a syncopal episode therefore ended up coming back to the hospital. Of note he developed urinary retention on 5/16 and has had Foley catheter in place since.   Assessment & Plan:  Principal Problem:   SIRS (systemic inflammatory response syndrome) (HCC)     Sepsis secondary to urinary tract infection UTI, catheter associated - SePSIS secondary to urinary tract infection.  During this admission there is concerns of possible UTI but urine cultures from 5/23 is growing Pseudomonas.  Blood cultures remain negative. - Continue cefepime.  Discontinue vancomycin and Flagyl.  Will further tailor as necessary -Suspect catheter associated UTI.  Had Foley PTA from recent ER visit from 9 days ago for urinary retention.  Will discontinue Foley catheter and monitor here for urinary retention   GERD -omeprazole   Hypertension -currently off antihypertensives.  IV as needed   Hyperlipidemia -Lipitor   Neuropathy -gabapentin    COPD Stage II non-small cell lung cancer -Home bronchodilators.  I-S/flutter valve -Recently completed chemotherapy follows with Dr. Liam Redhead  DVT prophylaxis: Lovenox     Code Status: Full Code Family Communication: Spouse updated Status is: Inpatient Remains inpatient appropriate because: Continue hospital stay for IV antibiotics    Subjective: Patient feels better this morning Remains afebrile overnight   Examination:  General exam: Appears calm and comfortable  Respiratory system: Clear to auscultation. Respiratory effort normal. Cardiovascular system: S1 & S2 heard, RRR. No JVD, murmurs, rubs, gallops or  clicks. No pedal edema. Gastrointestinal system: Abdomen is nondistended, soft and nontender. No organomegaly or masses felt. Normal bowel sounds heard. Central nervous system: Alert and oriented. No focal neurological deficits. Extremities: Symmetric 5 x 5 power. Skin: No rashes, lesions or ulcers Psychiatry: Judgement and insight appear normal. Mood & affect appropriate.                Diet Orders (From admission, onward)     Start     Ordered   02/18/24 1405  Diet regular Room service appropriate? Yes; Fluid consistency: Thin  Diet effective now       Question Answer Comment  Room service appropriate? Yes   Fluid consistency: Thin      02/18/24 1404            Objective: Vitals:   02/18/24 1433 02/18/24 1830 02/18/24 2159 02/19/24 0809  BP: 111/81 121/71 129/74   Pulse: 66 79 99   Resp: 18 20 20    Temp: 97.6 F (36.4 C) 98.4 F (36.9 C) 98.4 F (36.9 C)   TempSrc: Axillary Oral Oral   SpO2: 96% 100% 96% 91%  Weight:      Height: 5\' 11"  (1.803 m)       Intake/Output Summary (Last 24 hours) at 02/19/2024 0927 Last data filed at 02/19/2024 1308 Gross per 24 hour  Intake 2204.06 ml  Output 1150 ml  Net 1054.06 ml   Filed Weights   02/18/24 0807  Weight: 87.1 kg    Scheduled Meds:  atorvastatin   20 mg Oral Daily   Chlorhexidine  Gluconate Cloth  6 each Topical Daily   enoxaparin  (LOVENOX ) injection  40 mg Subcutaneous QHS   fluticasone  furoate-vilanterol  1 puff Inhalation Daily   gabapentin   300 mg Oral BID   pantoprazole   40 mg Oral Q1200   Continuous Infusions:  ceFEPime (MAXIPIME) IV 2 g (02/19/24 0907)    Nutritional status     Body mass index is 26.78 kg/m.  Data Reviewed:   CBC: Recent Labs  Lab 02/13/24 0800 02/17/24 1725 02/18/24 1036 02/19/24 0545  WBC 9.3 16.7* 18.1* 9.8  NEUTROABS 7.0 14.3* 16.0*  --   HGB 10.0* 9.5* 8.8* 8.8*  HCT 29.8* 29.3* 27.3* 27.4*  MCV 96.1 100.3* 104.2* 101.1*  PLT 338 253 182 181   Basic  Metabolic Panel: Recent Labs  Lab 02/13/24 0800 02/17/24 1725 02/18/24 1036 02/19/24 0545  NA 139 133* 130* 132*  K 3.2* 4.1 4.2 3.7  CL 100 97* 97* 99  CO2 34* 27 27 26   GLUCOSE 117* 154* 172* 115*  BUN 7* 12 18 18   CREATININE 1.08 1.20 1.44* 1.31*  CALCIUM  8.3* 8.1* 7.6* 7.9*  MG 1.6*  --  2.2  --    GFR: Estimated Creatinine Clearance: 55.9 mL/min (A) (by C-G formula based on SCr of 1.31 mg/dL (H)). Liver Function Tests: Recent Labs  Lab 02/13/24 0800 02/17/24 1725 02/18/24 1036  AST 14* 21 16  ALT 11 10 10   ALKPHOS 79 73 66  BILITOT 0.3 0.3 1.0  PROT 5.2* 5.2* 4.7*  ALBUMIN  3.1* 2.5* 2.3*   No results for input(s): "LIPASE", "AMYLASE" in the last 168 hours. No results for input(s): "AMMONIA" in the last 168 hours. Coagulation Profile: Recent Labs  Lab 02/18/24 1035  INR 1.4*   Cardiac Enzymes: No results for input(s): "CKTOTAL", "CKMB", "CKMBINDEX", "TROPONINI" in the last 168 hours. BNP (last 3 results) No results for input(s): "PROBNP" in the last 8760 hours. HbA1C: No results for input(s): "HGBA1C" in the last 72 hours. CBG: Recent Labs  Lab 02/18/24 0823  GLUCAP 145*   Lipid Profile: No results for input(s): "CHOL", "HDL", "LDLCALC", "TRIG", "CHOLHDL", "LDLDIRECT" in the last 72 hours. Thyroid  Function Tests: No results for input(s): "TSH", "T4TOTAL", "FREET4", "T3FREE", "THYROIDAB" in the last 72 hours. Anemia Panel: No results for input(s): "VITAMINB12", "FOLATE", "FERRITIN", "TIBC", "IRON", "RETICCTPCT" in the last 72 hours. Sepsis Labs: Recent Labs  Lab 02/17/24 1736 02/17/24 2002 02/18/24 1043  LATICACIDVEN 2.0* 1.1 1.2    Recent Results (from the past 240 hours)  Urine Culture     Status: Abnormal   Collection Time: 02/09/24 10:29 PM   Specimen: Urine, Random  Result Value Ref Range Status   Specimen Description   Final    URINE, RANDOM Performed at [facility] [address], [place], [place] [address]     Special Requests   Final    NONE Reflexed from [id number] Performed at [facility], [address], [place], [place] [address]    Culture >=100,000 COLONIES/mL STAPHYLOCOCCUS EPIDERMIDIS (A)  Final   Report Status 02/12/2024 FINAL  Final   Organism ID, Bacteria STAPHYLOCOCCUS EPIDERMIDIS (A)  Final      Susceptibility   Staphylococcus epidermidis - MIC*    CIPROFLOXACIN  <=0.5 SENSITIVE Sensitive     GENTAMICIN  <=0.5 SENSITIVE Sensitive     NITROFURANTOIN <=16 SENSITIVE Sensitive     OXACILLIN >=4 RESISTANT Resistant     TETRACYCLINE >=16 RESISTANT Resistant     VANCOMYCIN 1 SENSITIVE Sensitive     TRIMETH /SULFA  <=10 SENSITIVE Sensitive     RIFAMPIN <=0.5 SENSITIVE Sensitive     Inducible Clindamycin NEGATIVE Sensitive     * >=  100,000 COLONIES/mL STAPHYLOCOCCUS EPIDERMIDIS  Blood Culture (routine x 2)     Status: None (Preliminary result)   Collection Time: 02/17/24  5:20 PM   Specimen: BLOOD RIGHT FOREARM  Result Value Ref Range Status   Specimen Description   Final    BLOOD RIGHT FOREARM Performed at [facility], [address], [place], [place] [address]    Special Requests   Final    BOTTLES DRAWN AEROBIC AND ANAEROBIC Blood Culture adequate volume Performed at [facility], [address] [place], [place] [address]    Culture   Final    NO GROWTH 2 DAYS Performed at [facility], [address], [place], [place] [address]    Report Status PENDING  Incomplete  Blood Culture (routine x 2)     Status: None (Preliminary result)   Collection Time: 02/17/24  5:25 PM   Specimen: Left Antecubital; Blood  Result Value Ref Range Status   Specimen Description   Final    LEFT ANTECUBITAL Performed at [facility], [address], [place], [place] [address]    Special Requests   Final    BOTTLES DRAWN AEROBIC AND ANAEROBIC Blood Culture results may not be optimal due to an inadequate volume of blood  received in culture bottles Performed at [facility], [address], [place], [place] [address]    Culture  Setup Time   Final    GRAM NEGATIVE RODS ANAEROBIC BOTTLE ONLY CRITICAL RESULT CALLED TO, READ BACK BY AND VERIFIED WITH: [facility] CHRISTINE SHADE [id number] AT 1629 BY EC Performed at [facility], [address], [place], [place] [address]    Culture GRAM NEGATIVE RODS  Final   Report Status PENDING  Incomplete  Blood Culture ID Panel (Reflexed)     Status: Abnormal   Collection Time: 02/17/24  5:25 PM  Result Value Ref Range Status   Enterococcus faecalis NOT DETECTED NOT DETECTED Final   Enterococcus Faecium NOT DETECTED NOT DETECTED Final   Listeria monocytogenes NOT DETECTED NOT DETECTED Final   Staphylococcus species NOT DETECTED NOT DETECTED Final   Staphylococcus aureus (BCID) NOT DETECTED NOT DETECTED Final   Staphylococcus epidermidis NOT DETECTED NOT DETECTED Final   Staphylococcus lugdunensis NOT DETECTED NOT DETECTED Final   Streptococcus species NOT DETECTED NOT DETECTED Final   Streptococcus agalactiae NOT DETECTED NOT DETECTED Final   Streptococcus pneumoniae NOT DETECTED NOT DETECTED Final   Streptococcus pyogenes NOT DETECTED NOT DETECTED Final   A.calcoaceticus-baumannii NOT DETECTED NOT DETECTED Final   Bacteroides fragilis NOT DETECTED NOT DETECTED Final   Enterobacterales NOT DETECTED NOT DETECTED Final   Enterobacter cloacae complex NOT DETECTED NOT DETECTED Final   Escherichia coli NOT DETECTED NOT DETECTED Final   Klebsiella aerogenes NOT DETECTED NOT DETECTED Final   Klebsiella oxytoca NOT DETECTED NOT DETECTED Final   Klebsiella pneumoniae NOT DETECTED NOT DETECTED Final   Proteus species NOT DETECTED NOT DETECTED Final   Salmonella species NOT DETECTED NOT DETECTED Final   Serratia marcescens NOT DETECTED NOT DETECTED Final   Haemophilus influenzae NOT DETECTED NOT DETECTED Final   Neisseria meningitidis NOT DETECTED NOT  DETECTED Final   Pseudomonas aeruginosa DETECTED (A) NOT DETECTED Final    Comment: CRITICAL RESULT CALLED TO, READ BACK BY AND VERIFIED WITH: [facility] CHRISTINE SHADE [phone] AT 1629 BY EC    Stenotrophomonas maltophilia NOT DETECTED NOT DETECTED Final   Candida albicans NOT DETECTED NOT DETECTED Final  Candida auris NOT DETECTED NOT DETECTED Final   Candida glabrata NOT DETECTED NOT DETECTED Final   Candida krusei NOT DETECTED NOT DETECTED Final   Candida parapsilosis NOT DETECTED NOT DETECTED Final   Candida tropicalis NOT DETECTED NOT DETECTED Final   Cryptococcus neoformans/gattii NOT DETECTED NOT DETECTED Final   CTX-M ESBL NOT DETECTED NOT DETECTED Final   Carbapenem resistance IMP NOT DETECTED NOT DETECTED Final   Carbapenem resistance KPC NOT DETECTED NOT DETECTED Final   Carbapenem resistance NDM NOT DETECTED NOT DETECTED Final   Carbapenem resistance VIM NOT DETECTED NOT DETECTED Final    Comment: Performed at [facility], [address] [place], [place] [address]  Urine Culture     Status: Abnormal (Preliminary result)   Collection Time: 02/17/24  6:24 PM   Specimen: Urine, Random  Result Value Ref Range Status   Specimen Description   Final    URINE, RANDOM Performed at [facility], [address] [place], [place] [address]    Special Requests   Final    NONE Reflexed from [id number] Performed at [facility], [address] [place], [place] [address]    Culture (A)  Final    >=100,000 COLONIES/mL PSEUDOMONAS AERUGINOSA SUSCEPTIBILITIES TO FOLLOW Performed at [facility], [address], [place], [place] [address]    Report Status PENDING  Incomplete  Resp panel by RT-PCR (RSV, Flu A&B, Covid) Anterior Nasal Swab     Status: None   Collection Time: 02/18/24  9:42 AM   Specimen: Anterior Nasal Swab  Result Value Ref Range Status   SARS Coronavirus 2 by RT PCR NEGATIVE NEGATIVE Final    Comment:  (NOTE) SARS-CoV-2 target nucleic acids are NOT DETECTED.  The SARS-CoV-2 RNA is generally detectable in upper respiratory specimens during the acute phase of infection. The lowest concentration of SARS-CoV-2 viral copies this assay can detect is 138 copies/mL. A negative result does not preclude SARS-Cov-2 infection and should not be used as the sole basis for treatment or other patient management decisions. A negative result may occur with  improper specimen collection/handling, submission of specimen other than nasopharyngeal swab, presence of viral mutation(s) within the areas targeted by this assay, and inadequate number of viral copies(<138 copies/mL). A negative result must be combined with clinical observations, patient history, and epidemiological information. The expected result is Negative.  Fact Sheet for Patients:  BloggerCourse.com  Fact Sheet for Healthcare Providers:  SeriousBroker.it  This test is no t yet approved or cleared by the [place]  FDA and  has been authorized for detection and/or diagnosis of SARS-CoV-2 by FDA under an Emergency Use Authorization (EUA). This EUA will remain  in effect (meaning this test can be used) for the duration of the COVID-19 declaration under Section 564(b)(1) of the Act, 21 U.S.C.section 360bbb-3(b)(1), unless the authorization is terminated  or revoked sooner.       Influenza A by PCR NEGATIVE NEGATIVE Final   Influenza B by PCR NEGATIVE NEGATIVE Final    Comment: (NOTE) The Xpert Xpress SARS-CoV-2/FLU/RSV plus assay is intended as an aid in the diagnosis of influenza from Nasopharyngeal swab specimens and should not be used as a sole basis for treatment. Nasal washings and aspirates are unacceptable for Xpert Xpress SARS-CoV-2/FLU/RSV testing.  Fact Sheet for Patients: BloggerCourse.com  Fact Sheet for Healthcare  Providers: SeriousBroker.it  This test is not yet approved or cleared by the [place]  FDA and has been authorized for detection  and/or diagnosis of SARS-CoV-2 by FDA under an Emergency Use Authorization (EUA). This EUA will remain in effect (meaning this test can be used) for the duration of the COVID-19 declaration under Section 564(b)(1) of the Act, 21 U.S.C. section 360bbb-3(b)(1), unless the authorization is terminated or revoked.     Resp Syncytial Virus by PCR NEGATIVE NEGATIVE Final    Comment: (NOTE) Fact Sheet for Patients: BloggerCourse.com  Fact Sheet for Healthcare Providers: SeriousBroker.it  This test is not yet approved or cleared by the [place]  FDA and has been authorized for detection and/or diagnosis of SARS-CoV-2 by FDA under an Emergency Use Authorization (EUA). This EUA will remain in effect (meaning this test can be used) for the duration of the COVID-19 declaration under Section 564(b)(1) of the Act, 21 U.S.C. section 360bbb-3(b)(1), unless the authorization is terminated or revoked.  Performed at [facility], [address] [place], [place] [address]   Culture, blood (Routine X 2) w Reflex to ID Panel     Status: None (Preliminary result)   Collection Time: 02/19/24  5:45 AM   Specimen: BLOOD RIGHT ARM  Result Value Ref Range Status   Specimen Description   Final    BLOOD RIGHT ARM Performed at [facility], [address] [place], [place] [address]    Special Requests   Final    BOTTLES DRAWN AEROBIC AND ANAEROBIC Blood Culture adequate volume Performed at [facility], [address], [place], [place] [address]    Culture PENDING  Incomplete   Report Status PENDING  Incomplete  Culture, blood (Routine X 2) w Reflex to ID Panel     Status: None (Preliminary result)   Collection Time: 02/19/24  5:45 AM   Specimen:  BLOOD RIGHT ARM  Result Value Ref Range Status   Specimen Description   Final    BLOOD RIGHT ARM Performed at [facility], [address], [place], [place] [address]    Special Requests   Final    BOTTLES DRAWN AEROBIC AND ANAEROBIC Blood Culture results may not be optimal due to an inadequate volume of blood received in culture bottles Performed at [facility], [address], [place], [place] [address]    Culture PENDING  Incomplete   Report Status PENDING  Incomplete         Radiology Studies: MR BRAIN WO CONTRAST Result Date: 02/18/2024 CLINICAL DATA:  Acute stroke suspected EXAM: MRI HEAD WITHOUT CONTRAST TECHNIQUE: Multiplanar, multiecho pulse sequences of the brain and surrounding structures were obtained without intravenous contrast. COMPARISON:  02/18/2024 head CT FINDINGS: Brain: No acute infarction, hemorrhage, hydrocephalus, extra-axial collection or mass lesion. Small chronic infarcts in the bilateral occipital parietal cortex. Mild chronic white matter disease. Preserved brain volume for age. Vascular: Normal flow voids. Skull and upper cervical spine: Normal marrow signal. Sinuses/Orbits: Mild patchy opacification of frontal ethmoidal sinuses, chronic. Bilateral cataract resection. IMPRESSION: No acute or interval finding. Small chronic bilateral parietooccipital infarcts. Electronically Signed   By: Ronnette Coke M.D.   On: 02/18/2024 12:42   CT Angio Chest PE W and/or Wo Contrast Result Date: 02/18/2024 CLINICAL DATA:  Sepsis.  Syncope.  History of lung cancer. EXAM: CT ANGIOGRAPHY CHEST CT ABDOMEN AND PELVIS WITH CONTRAST TECHNIQUE: Multidetector CT imaging of the chest was performed using the standard protocol during bolus administration of intravenous contrast. Multiplanar CT image reconstructions and MIPs were obtained to evaluate the vascular anatomy. Multidetector CT imaging of the abdomen  and pelvis was performed using the standard protocol  during bolus administration of intravenous contrast. RADIATION DOSE REDUCTION: This exam was performed according to the departmental dose-optimization program which includes automated exposure control, adjustment of the mA and/or kV according to patient size and/or use of iterative reconstruction technique. CONTRAST:  OMNIPAQUE  IOHEXOL  350 MG/ML SOLN COMPARISON:  PET-CT 08/02/2023. FINDINGS: CTA CHEST FINDINGS Cardiovascular: Satisfactory opacification of the pulmonary arteries to the segmental level. No evidence of pulmonary embolism. Normal heart size. No pericardial effusion. Aortic atherosclerosis and multi vessel coronary artery calcifications. Mediastinum/Nodes: No enlarged mediastinal, hilar, or axillary lymph nodes. Thyroid  gland, trachea, and esophagus demonstrate no significant findings. Lungs/Pleura: Status post left upper lobectomy. Small left pleural effusion. Emphysema with diffuse bronchial wall thickening. No airspace consolidation, atelectasis or pneumothorax. Musculoskeletal: No chest wall abnormality. No acute or significant osseous findings. Review of the MIP images confirms the above findings. CT ABDOMEN and PELVIS FINDINGS Hepatobiliary: No focal liver abnormality. Status post cholecystectomy. No bile duct dilatation. Pancreas: Unremarkable. No pancreatic ductal dilatation or surrounding inflammatory changes. Spleen: Normal in size without focal abnormality. Adrenals/Urinary Tract: Normal adrenal glands. No nephrolithiasis or hydronephrosis. Cyst off the posterior cortex of left kidney measures 2.1 cm, image 34/5. No follow-up imaging recommended. There is asymmetric right-sided perinephric soft tissue stranding. Striated nephrograms identified within the upper and lower pole of the right kidney. No obstructive uropathy identified bilaterally. Thick walled urinary bladder is decompressed around a Foley catheter. Stomach/Bowel: Stomach is within normal limits. Status post appendectomy. No  evidence of bowel wall thickening, distention, or inflammatory changes. Vascular/Lymphatic: Aortic atherosclerosis. 4.4 cm infrarenal abdominal aortic aneurysm, image 41/5. No abdominopelvic adenopathy. Reproductive: Status post prostatectomy. Other: No significant free fluid or fluid collections. Musculoskeletal: No acute or significant osseous findings. Lumbar spondylosis. Schmorl's node deformity within the superior endplate of L4. Review of the MIP images confirms the above findings. IMPRESSION: 1. No evidence for acute pulmonary embolus. 2. Small left pleural effusion. 3. Emphysema with diffuse bronchial wall thickening. 4. There is asymmetric right-sided striated nephrograms with perinephric fat stranding. Correlate for clinical signs or symptoms of pyelonephritis. 5. Thick-walled urinary bladder decompressed around a Foley catheter. Correlate for any signs or symptoms of cystitis. 6. 4.4 cm infrarenal abdominal aortic aneurysm. Recommend follow-up CT or MR as appropriate in 12 months and referral to or continued care with vascular specialist. (Ref.: J Vasc Surg. 2018; 67:2-77 and J Am Coll Radiol 2013;10(10):789-794.) 7. Coronary artery calcifications. 8. Aortic Atherosclerosis (ICD10-I70.0) and Emphysema (ICD10-J43.9). Electronically Signed   By: Kimberley Penman M.D.   On: 02/18/2024 12:28   CT ABDOMEN PELVIS W CONTRAST Result Date: 02/18/2024 CLINICAL DATA:  Sepsis.  Syncope.  History of lung cancer. EXAM: CT ANGIOGRAPHY CHEST CT ABDOMEN AND PELVIS WITH CONTRAST TECHNIQUE: Multidetector CT imaging of the chest was performed using the standard protocol during bolus administration of intravenous contrast. Multiplanar CT image reconstructions and MIPs were obtained to evaluate the vascular anatomy. Multidetector CT imaging of the abdomen and pelvis was performed using the standard protocol during bolus administration of intravenous contrast. RADIATION DOSE REDUCTION: This exam was performed according to the  departmental dose-optimization program which includes automated exposure control, adjustment of the mA and/or kV according to patient size and/or use of iterative reconstruction technique. CONTRAST:  OMNIPAQUE  IOHEXOL  350 MG/ML SOLN COMPARISON:  PET-CT 08/02/2023. FINDINGS: CTA CHEST FINDINGS Cardiovascular: Satisfactory opacification of the pulmonary arteries to the segmental level. No evidence of pulmonary embolism. Normal heart size. No pericardial effusion. Aortic  atherosclerosis and multi vessel coronary artery calcifications. Mediastinum/Nodes: No enlarged mediastinal, hilar, or axillary lymph nodes. Thyroid  gland, trachea, and esophagus demonstrate no significant findings. Lungs/Pleura: Status post left upper lobectomy. Small left pleural effusion. Emphysema with diffuse bronchial wall thickening. No airspace consolidation, atelectasis or pneumothorax. Musculoskeletal: No chest wall abnormality. No acute or significant osseous findings. Review of the MIP images confirms the above findings. CT ABDOMEN and PELVIS FINDINGS Hepatobiliary: No focal liver abnormality. Status post cholecystectomy. No bile duct dilatation. Pancreas: Unremarkable. No pancreatic ductal dilatation or surrounding inflammatory changes. Spleen: Normal in size without focal abnormality. Adrenals/Urinary Tract: Normal adrenal glands. No nephrolithiasis or hydronephrosis. Cyst off the posterior cortex of left kidney measures 2.1 cm, image 34/5. No follow-up imaging recommended. There is asymmetric right-sided perinephric soft tissue stranding. Striated nephrograms identified within the upper and lower pole of the right kidney. No obstructive uropathy identified bilaterally. Thick walled urinary bladder is decompressed around a Foley catheter. Stomach/Bowel: Stomach is within normal limits. Status post appendectomy. No evidence of bowel wall thickening, distention, or inflammatory changes. Vascular/Lymphatic: Aortic atherosclerosis. 4.4  cm infrarenal abdominal aortic aneurysm, image 41/5. No abdominopelvic adenopathy. Reproductive: Status post prostatectomy. Other: No significant free fluid or fluid collections. Musculoskeletal: No acute or significant osseous findings. Lumbar spondylosis. Schmorl's node deformity within the superior endplate of L4. Review of the MIP images confirms the above findings. IMPRESSION: 1. No evidence for acute pulmonary embolus. 2. Small left pleural effusion. 3. Emphysema with diffuse bronchial wall thickening. 4. There is asymmetric right-sided striated nephrograms with perinephric fat stranding. Correlate for clinical signs or symptoms of pyelonephritis. 5. Thick-walled urinary bladder decompressed around a Foley catheter. Correlate for any signs or symptoms of cystitis. 6. 4.4 cm infrarenal abdominal aortic aneurysm. Recommend follow-up CT or MR as appropriate in 12 months and referral to or continued care with vascular specialist. (Ref.: J Vasc Surg. 2018; 67:2-77 and J Am Coll Radiol 2013;10(10):789-794.) 7. Coronary artery calcifications. 8. Aortic Atherosclerosis (ICD10-I70.0) and Emphysema (ICD10-J43.9). Electronically Signed   By: Kimberley Penman M.D.   On: 02/18/2024 12:28   CT Head Wo Contrast Result Date: 02/18/2024 CLINICAL DATA:  Syncope/presyncope with cerebrovascular cause suspected EXAM: CT HEAD WITHOUT CONTRAST TECHNIQUE: Contiguous axial images were obtained from the base of the skull through the vertex without intravenous contrast. RADIATION DOSE REDUCTION: This exam was performed according to the departmental dose-optimization program which includes automated exposure control, adjustment of the mA and/or kV according to patient size and/or use of iterative reconstruction technique. COMPARISON:  09/02/2023 brain MRI FINDINGS: Brain: No evidence of acute infarction, hemorrhage, hydrocephalus, extra-axial collection or mass lesion/mass effect. Small chronic infarct in the parasagittal right more  than left parietooccipital cortex Vascular: No hyperdense vessel or unexpected calcification. Skull: Normal. Negative for fracture or focal lesion. Sinuses/Orbits: No acute finding. 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 Electronically Signed   By: Ronnette Coke M.D.   On: 02/18/2024 11:58   DG Chest Port 1 View Result Date: 02/18/2024 CLINICAL DATA:  Sepsis.  Evaluate for abnormality. EXAM: PORTABLE CHEST 1 VIEW COMPARISON:  02/17/2024 FINDINGS: There is a right chest wall port a catheter with tip in the distal SVC. Aortic atherosclerosis. Stable cardiomediastinal contours. No pleural fluid, interstitial edema or airspace disease. IMPRESSION: No acute cardiopulmonary abnormalities. Electronically Signed   By: Kimberley Penman M.D.   On: 02/18/2024 09:52   DG Chest Port 1 View Result Date: 02/17/2024 EXAM: 1 VIEW XRAY OF THE CHEST 02/17/2024 05:31:00 PM COMPARISON: 02/09/2024  CLINICAL HISTORY: Questionable sepsis - evaluate for abnormality. FINDINGS: LUNGS AND PLEURA: No focal pulmonary opacity. No pulmonary edema. No pleural effusion. No pneumothorax. HEART AND MEDIASTINUM: No acute abnormality of the cardiac and mediastinal silhouettes. Sclerotic calcifications are present at the aortic arch. BONES AND SOFT TISSUES: No acute osseous abnormality. A right IJ port-a-cath is stable. IMPRESSION: 1. No acute findings. Electronically signed by: Audree Leas MD 02/17/2024 07:05 PM EDT RP Workstation: [id number]           LOS: 1 day   Time spent= 35 mins    Maggie Schooner, MD [facility]  If 7PM-7AM, please contact night-coverage  02/19/2024, 9:27 AM

## 2024-02-19 NOTE — Evaluation (Signed)
 Occupational Therapy Evaluation Patient Details Name: Cameron [name] MRN: [id number] DOB: 09-30-1950 Today's Date: 02/19/2024   History of Present Illness   Pt is a 73 year old male who presented after syncopal episode at home on 5/23. Patient was admitted with sepsis,and UTI.  PMH: severe stage IIA squamous cell carcinoma diagnosed in January 2025 status post left upper lobectomy and lymph node dissection on October 03, 2023, undergoing chemotherapy; hx of prostate cancer s/p TURP, COPD, CAD, HLD, HTN.     Clinical Impressions Patient is a 73 year old male who was admitted for above. Patient was living at home with wife with independence in ADLs at rollator level. Currently, patient needs min A to regain balance with turning head or making turns in standing. Patient appears to have decreased insight to deficit with continued education and physical A  needed to maintain balance with these tasks. Patient was noted to have decreased functional activity tolerance, decreased endurance, decreased standing balance, decreased safety awareness, and decreased knowledge of AD/AE impacting participation in ADLs. Patient plans to d/c home with family support and [facility] services when medically stable.       If plan is discharge home, recommend the following:   A little help with walking and/or transfers;A little help with bathing/dressing/bathroom;Assistance with cooking/housework;Direct supervision/assist for medications management;Assist for transportation;Help with stairs or ramp for entrance;Direct supervision/assist for financial management     Functional Status Assessment   Patient has had a recent decline in their functional status and demonstrates the ability to make significant improvements in function in a reasonable and predictable amount of time.     Equipment Recommendations   None recommended by OT      Precautions/Restrictions   Precautions Precautions: None Restrictions Weight  Bearing Restrictions Per Provider Order: No     Mobility Bed Mobility Overal bed mobility: Needs Assistance Bed Mobility: Supine to Sit     Supine to sit: Supervision               Balance Overall balance assessment: Needs assistance Sitting-balance support: Feet supported, No upper extremity supported Sitting balance-Leahy Scale: Good     Standing balance support: Bilateral upper extremity supported, Reliant on assistive device for balance Standing balance-Leahy Scale: Poor Standing balance comment: LOB with turning       ADL either performed or assessed with clinical judgement   ADL Overall ADL's : Needs assistance/impaired Eating/Feeding: Modified independent;Sitting   Grooming: Standing;Contact guard assist;Cueing for safety;Wash/dry hands Grooming Details (indicate cue type and reason): at sink with patient having near LOB with turning to look for paper towel dispencer. Upper Body Bathing: Sitting;Set up   Lower Body Bathing: Sitting/lateral leans;Minimal assistance   Upper Body Dressing : Sitting;Set up   Lower Body Dressing: Sitting/lateral leans;Minimal assistance   Toilet Transfer: Minimal assistance;Ambulation;Rolling walker (2 wheels) Toilet Transfer Details (indicate cue type and reason): patient on way to bathroom was noted to look back over L shoulder to answer nurse questions with LOB with min A needed to maintain balance and continue walking. noted to cross feet with turns even when cued not to. appears to have decreased insight to this issue as it happened again in session. Toileting- Clothing Manipulation and Hygiene: Sitting/lateral lean;Contact guard assist               Vision Patient Visual Report: No change from baseline              Pertinent Vitals/Pain Pain Assessment Pain Assessment: No/denies pain  Extremity/Trunk Assessment Upper Extremity Assessment Upper Extremity Assessment: Overall WFL for tasks assessed   Lower  Extremity Assessment Lower Extremity Assessment: Defer to PT evaluation   Cervical / Trunk Assessment Cervical / Trunk Assessment: Normal   Communication     Cognition Arousal: Alert Behavior During Therapy: WFL for tasks assessed/performed               OT - Cognition Comments: poor safety awareness with LOB with turning. otherwise appropriate during session.                 Following commands: Intact       Cueing  General Comments   Cueing Techniques: Verbal cues              Home Living Family/patient expects to be discharged to:: Private residence Living Arrangements: Spouse/significant other Available Help at Discharge: Family;Available 24 hours/day Type of Home: House Home Access: Stairs to enter [employer] of Steps: 3 Entrance Stairs-Rails: Can reach both Home Layout: One level;Laundry or work area in basement     [employer]: [employer] Equipment: Tub bench          Prior Functioning/Environment Prior Level of Function : Independent/Modified Independent             Mobility Comments: IND ADLs Comments: SUP for showers since recent syncope    OT Problem List: Impaired balance (sitting and/or standing);Decreased safety awareness;Decreased knowledge of precautions;Cardiopulmonary status limiting activity;Decreased knowledge of use of DME or AE;Decreased activity tolerance   OT Treatment/Interventions: Self-care/ADL training;DME and/or AE instruction;Balance training;Therapeutic activities;Energy conservation;Patient/family education      OT Goals(Current goals can be found in the care plan section)   Acute Rehab OT Goals Patient Stated Goal: to get back home OT Goal Formulation: With patient Time For Goal Achievement: 03/04/24 Potential to Achieve Goals: Fair   OT Frequency:  Min 2X/week       AM-PAC OT "6 Clicks" Daily Activity     Outcome Measure Help from another person eating  meals?: None Help from another person taking care of personal grooming?: A Little Help from another person toileting, which includes using toliet, bedpan, or urinal?: A Little Help from another person bathing (including washing, rinsing, drying)?: A Little Help from another person to put on and taking off regular upper body clothing?: A Little Help from another person to put on and taking off regular lower body clothing?: A Lot 6 Click Score: 18   End of Session Equipment Utilized During Treatment: Gait belt;Rolling walker (2 wheels) Nurse Communication: Other (comment) (patient needing support to keep balance with turns via secure chat)  Activity Tolerance: Patient tolerated treatment well Patient left: in chair;with call bell/phone within reach;with chair alarm set  OT Visit Diagnosis: Unsteadiness on feet (R26.81);Other abnormalities of gait and mobility (R26.89)                Time: 1610-9604 OT Time Calculation (min): 19 min Charges:  OT General Charges $OT Visit: 1 Visit OT Evaluation $OT Eval Low Complexity: 1 Low  Treyvonne Tata OTR/L, MS Acute Rehabilitation Department Office# [phone]   Jame Maze 02/19/2024, 11:18 AM

## 2024-02-19 NOTE — Evaluation (Addendum)
 Physical Therapy One TIme Evaluation Patient Details Name: LAQUINN SHIPPY MRN: [id number] DOB: 09/05/1951 Today's Date: 02/19/2024  History of Present Illness  Pt is a 73 year old male who presented after syncopal episode at home on 5/23. Patient was admitted with sepsis,and UTI.  PMH: severe stage IIA squamous cell carcinoma diagnosed in January 2025 status post left upper lobectomy and lymph node dissection on October 03, 2023, undergoing chemotherapy; hx of prostate cancer s/p TURP, COPD, CAD, HLD, HTN.  Clinical Impression  Patient evaluated by Physical Therapy with no further acute PT needs identified. All education has been completed and the patient has no further questions.  See below for any follow-up Physical Therapy or equipment needs. PT is signing off. Thank you for this referral. Upon therapist's arrival to room, pt's spouse immediately reporting pt having multiple syncopal episodes since beginning of this month.  Pt reports no warning.  BPs obtained in sitting, standing and after ambulating as below.  Pt able to ambulate 200 ft in hallway without assistive device.  Spouse reports pt with generalized weakness and due to syncopal episodes with decreased mobility, she would like HHPT upon d/c.  Recommend pt continue to mobilize with staff and mobility specialists during remainder of hospitalization (with gait belt for safety due to syncopal episodes).  Pt has no further acute PT needs and can f/u with HHPT.     02/19/24 1030  Vital Signs  BP Location Right Arm  BP Method Automatic  Patient Position (if appropriate) Orthostatic Vitals  Orthostatic Sitting  BP- Sitting 109/66  Pulse- Sitting 79  Orthostatic Standing at 0 minutes  BP- Standing at 0 minutes 112/66  Pulse- Standing at 0 minutes 84  Orthostatic Standing at 3 minutes  BP- Standing at 3 minutes 117/66  Pulse- Standing at 3 minutes 84  Oxygen Therapy  SpO2 99 %  O2 Device Room Air          02/19/24 1040  Vital Signs   Pulse Rate 85  Pulse Rate Source Dinamap  BP 114/64  BP Location Right Arm  BP Method Automatic  Patient Position (if appropriate) Standing (after ambulating)  Oxygen Therapy  SpO2 92 %  O2 Device Room Air    If plan is discharge home, recommend the following:     Can travel by private vehicle        Equipment Recommendations None recommended by PT  Recommendations for Other Services       Functional Status Assessment Patient has had a recent decline in their functional status and demonstrates the ability to make significant improvements in function in a reasonable and predictable amount of time.     Precautions / Restrictions Precautions Precautions: Fall Precaution/Restrictions Comments: multiple recent syncopal epsides this month per spouse      Mobility  Bed Mobility               General bed mobility comments: pt in recliner on arrival    Transfers Overall transfer level: Needs assistance Equipment used: None Transfers: Sit to/from Stand Sit to Stand: Contact guard assist           General transfer comment: CGA for safety due to hx of syncopal episodes; also obtained BPs    Ambulation/Gait Ambulation/Gait assistance: Contact guard assist Gait Distance (Feet): 200 Feet Assistive device: None Gait Pattern/deviations: Step-through pattern, Decreased stride length       General Gait Details: no overt LOB, denies any symptoms  Stairs  Wheelchair Mobility     Tilt Bed    Modified Rankin (Stroke Patients Only)       Balance Overall balance assessment: Needs assistance         Standing balance support: No upper extremity supported Standing balance-Leahy Scale: Fair                               Pertinent Vitals/Pain Pain Assessment Pain Assessment: No/denies pain    Home Living Family/patient expects to be discharged to:: Private residence Living Arrangements: Spouse/significant other Available  Help at Discharge: Family;Available 24 hours/day Type of Home: House Home Access: Stairs to enter Entrance Stairs-Rails: Can reach both Entrance Stairs-Number of Steps: 3   Home Layout: One level;Laundry or work area in [employer] Equipment: Tub bench      Prior Function Prior Level of Function : Independent/Modified Independent             Mobility Comments: IND ADLs Comments: SUP for showers since recent syncope     Extremity/Trunk Assessment   Upper Extremity Assessment Upper Extremity Assessment: Overall WFL for tasks assessed    Lower Extremity Assessment Lower Extremity Assessment: Overall WFL for tasks assessed    Cervical / Trunk Assessment Cervical / Trunk Assessment: Normal  Communication   Communication Communication: No apparent difficulties    Cognition Arousal: Alert Behavior During Therapy: WFL for tasks assessed/performed   PT - Cognitive impairments: No apparent impairments                         Following commands: Intact       Cueing       General Comments      Exercises     Assessment/Plan    PT Assessment All further PT needs can be met in the next venue of care  PT Problem List Decreased balance;Decreased knowledge of use of DME;Decreased activity tolerance;Decreased strength       PT Treatment Interventions      PT Goals (Current goals can be found in the Care Plan section)  Acute Rehab PT Goals PT Goal Formulation: All assessment and education complete, DC therapy    Frequency       Co-evaluation               AM-PAC PT "6 Clicks" Mobility  Outcome Measure Help needed turning from your back to your side while in a flat bed without using bedrails?: A Little Help needed moving from lying on your back to sitting on the side of a flat bed without using bedrails?: A Little Help needed moving to and from a bed to a chair (including a wheelchair)?: A Little Help needed standing up from a chair using  your arms (e.g., wheelchair or bedside chair)?: A Little Help needed to walk in hospital room?: A Little Help needed climbing 3-5 steps with a railing? : A Little 6 Click Score: 18    End of Session Equipment Utilized During Treatment: Gait belt Activity Tolerance: Patient tolerated treatment well Patient left: in chair;with call bell/phone within reach;with chair alarm set;with family/visitor present   PT Visit Diagnosis: Unsteadiness on feet (R26.81)    Time: 1018-1040 PT Time Calculation (min) (ACUTE ONLY): 22 min   Charges:   PT Evaluation $PT Eval Low Complexity: 1 Low   PT General Charges $$ ACUTE PT VISIT: 1 Visit        [place]  PT, DPT Physical Therapist Acute Rehabilitation Services Office: [phone]   Samual Crochet 02/19/2024, 12:51 PM

## 2024-02-20 ENCOUNTER — Telehealth (HOSPITAL_BASED_OUTPATIENT_CLINIC_OR_DEPARTMENT_OTHER): Payer: Self-pay | Admitting: *Deleted

## 2024-02-20 DIAGNOSIS — R6510 Systemic inflammatory response syndrome (SIRS) of non-infectious origin without acute organ dysfunction: Secondary | ICD-10-CM | POA: Diagnosis not present

## 2024-02-20 LAB — URINE CULTURE: Culture: >=100000 — AB

## 2024-02-20 LAB — CBC
HCT: 25.9 % — ABNORMAL LOW (ref 39.0–52.0)
Hemoglobin: 8.5 g/dL — ABNORMAL LOW (ref 13.0–17.0)
MCH: 32.9 pg (ref 26.0–34.0)
MCHC: 32.8 g/dL (ref 30.0–36.0)
MCV: 100.4 fL — ABNORMAL HIGH (ref 80.0–100.0)
Platelets: 160 10*3/uL (ref 150–400)
RBC: 2.58 MIL/uL — ABNORMAL LOW (ref 4.22–5.81)
RDW: 18.2 % — ABNORMAL HIGH (ref 11.5–15.5)
WBC: 7 10*3/uL (ref 4.0–10.5)
nRBC: 0 % (ref 0.0–0.2)

## 2024-02-20 LAB — BASIC METABOLIC PANEL WITH GFR
Anion gap: 7 (ref 5–15)
BUN: 15 mg/dL (ref 8–23)
CO2: 25 mmol/L (ref 22–32)
Calcium: 7.7 mg/dL — ABNORMAL LOW (ref 8.9–10.3)
Chloride: 99 mmol/L (ref 98–111)
Creatinine, Ser: 1.03 mg/dL (ref 0.61–1.24)
GFR, Estimated: >60 mL/min (ref >60)
Glucose, Bld: 97 mg/dL (ref 70–99)
Potassium: 3.3 mmol/L — ABNORMAL LOW (ref 3.5–5.1)
Sodium: 131 mmol/L — ABNORMAL LOW (ref 135–145)

## 2024-02-20 LAB — CULTURE, BLOOD (ROUTINE X 2)

## 2024-02-20 LAB — MAGNESIUM: Magnesium: 2.2 mg/dL (ref 1.7–2.4)

## 2024-02-20 MED ORDER — POTASSIUM CHLORIDE CRYS ER 20 MEQ PO TBCR
40.0000 meq | EXTENDED_RELEASE_TABLET | Freq: Once | ORAL | Status: AC
  Administered 2024-02-20: 40 meq via ORAL
  Filled 2024-02-20: qty 2

## 2024-02-20 NOTE — TOC Initial Note (Signed)
 Transition of Care [facility]) - Initial/Assessment Note   Patient Details  Name: Cameron [name] MRN: [id number] Date of Birth: 1951/07/20  Transition of Care [facility]) CM/SW Contact:    Zenon Hilda, LCSW Phone Number: 02/20/2024, 12:41 PM  Clinical Narrative: PT/OT evaluations recommended HH. CSW met with patient and spouse, Erroll Wilbourne, to discuss recommendations. Wife asked about SNF. CSW explained that due to patient ambulating distances of 200+ feet, he does not meet criteria for rehab. Wife reported patient is active with [facility] for [facility]. CSW followed up with Glenda with [facility] and confirmed the patient is currently active for HHPT. Per [facility] can accommodate the addition of OT. Hospitalist placed orders for HHPT/OT.  Expected Discharge Plan: Home w Home Health Services Barriers to Discharge: Continued Medical Work up  Patient Goals and CMS Choice Patient states their goals for this hospitalization and ongoing recovery are:: Home  Expected Discharge Plan and Services In-house Referral: Clinical Social Work Post Acute Care Choice: Home Health Living arrangements for the past 2 months: Single Family Home Expected Discharge Date: 02/20/24               DME Arranged: N/A DME Agency: NA HH Arranged: PT, OT HH Agency: [facility] Date HH Agency Contacted: 02/20/24 Time HH Agency Contacted: 1154 Representative spoke with at [facility] Agency: Glenda  Prior Living Arrangements/Services Living arrangements for the past 2 months: Single Family Home Lives with:: Spouse Patient language and need for interpreter reviewed:: Yes Do you feel safe going back to the place where you live?: Yes      Need for Family Participation in Patient Care: No (Comment) Care giver support system in place?: Yes (comment) Current home services: Home PT (Active with [facility]) Criminal Activity/Legal Involvement Pertinent to Current Situation/Hospitalization: No - Comment as needed  Activities of Daily  Living ADL Screening (condition at time of admission) Independently performs ADLs?: Yes (appropriate for developmental age) Is the patient deaf or have difficulty hearing?: No Does the patient have difficulty seeing, even when wearing glasses/contacts?: No Does the patient have difficulty concentrating, remembering, or making decisions?: No  Permission Sought/Granted Permission sought to share information with : Other (comment) Permission granted to share information with : Yes, Verbal Permission Granted Permission granted to share info w AGENCY: [facility]  Emotional Assessment Appearance:: Appears stated age Attitude/Demeanor/Rapport: Engaged Affect (typically observed): Accepting Orientation: : Oriented to Self, Oriented to Place, Oriented to  Time, Oriented to Situation Alcohol / Substance Use: Not Applicable Psych Involvement: No (comment)  Admission diagnosis:  Syncope and collapse [R55] Sepsis (HCC) [A41.9] Hypotension, unspecified hypotension type [I95.9] Patient Active Problem List   Diagnosis Date Noted   SIRS (systemic inflammatory response syndrome) (HCC) 02/19/2024   Multiple falls 02/10/2024   Lower extremity edema 02/10/2024   Urinary retention 02/10/2024   Hospital discharge follow-up 02/10/2024   Syncope 02/02/2024   AKI (acute kidney injury) (HCC) 02/02/2024   Port-A-Cath in place 12/28/2023   Primary squamous cell carcinoma of bronchus of left upper lobe (HCC) 11/10/2023   S/P lobectomy of lung 10/03/2023   Lung nodule 08/15/2023   Acute respiratory infection 06/28/2023   Loss of equilibrium 11/25/2022   Insomnia 11/25/2022   History of polycythemia 11/25/2022   Benign prostatic hyperplasia 07/08/2022   Acute cough 06/23/2022   Upper respiratory tract infection 06/23/2022   Preventative health care 05/04/2022   Abnormal hemoglobin (Hgb) (HCC) 04/08/2020   Need for hepatitis C screening test 04/26/2018   Centrilobular emphysema (HCC) 04/20/2018  Polycythemia 04/20/2018   CAD (coronary artery disease) 03/31/2018   Visit for preventive health examination 02/27/2018   Overweight (BMI 25.0-29.9) 04/23/2014   Tobacco dependence 12/29/2012   Health maintenance examination 03/13/2012   Hyperlipemia, mixed 03/13/2012   HTN (hypertension), benign 02/11/2012   PCP:  Dorothe Gaster, NP Pharmacy:   [facility] [address] [place] [address] Phone: [phone] Fax: [phone]  Social Drivers of Health (SDOH) Social History: SDOH Screenings   Food Insecurity: No Food Insecurity (02/18/2024)  Housing: Low Risk  (02/18/2024)  Transportation Needs: No Transportation Needs (02/18/2024)  Utilities: Not At Risk (02/18/2024)  Alcohol Screen: Low Risk  (11/21/2023)  Depression (PHQ2-9): High Risk (02/10/2024)  Financial Resource Strain: Low Risk  (11/21/2023)  Physical Activity: Sufficiently Active (11/21/2023)  Social Connections: Socially Integrated (02/18/2024)  Stress: No Stress Concern Present (11/21/2023)  Tobacco Use: High Risk (02/18/2024)  Health Literacy: Adequate Health Literacy (11/21/2023)   SDOH Interventions:    Readmission Risk Interventions    10/06/2023    8:57 AM  Readmission Risk Prevention Plan  Post Dischage Appt Complete  Medication Screening Complete  Transportation Screening Complete

## 2024-02-20 NOTE — Progress Notes (Signed)
 PROGRESS NOTE    Cameron [name]  ZOX:[id number] DOB: Feb 02, 1951 DOA: 02/18/2024 PCP: Dorothe Gaster, NP    Brief Narrative:  73 year old with history of COPD, HLD, HTN, lung cancer recently completed chemotherapy admitted for fever of unknown etiology.  Came to the ER day prior to admission and was discharged with empiric Augmentin  but then he had a syncopal episode therefore ended up coming back to the hospital. Of note he developed urinary retention on 5/16 and has had Foley catheter in place since.   Assessment & Plan:  Principal Problem:   SIRS (systemic inflammatory response syndrome) (HCC)     Sepsis secondary to urinary tract infection UTI, catheter associated - SePSIS secondary to urinary tract infection.  Urine cultures from recent ER visit and during this admission both are growing Pseudomonas.  We can continue IV cefepime for now and upon discharge we can transition to p.o. Cipro  -Suspect catheter associated UTI.  Had Foley PTA from recent ER visit from 9 days ago for urinary retention.  Will discontinue Foley catheter and monitor here for urinary retention   GERD -omeprazole   Hypertension -currently off antihypertensives.  IV as needed   Hyperlipidemia -Lipitor   Neuropathy -gabapentin    COPD Stage II non-small cell lung cancer -Home bronchodilators.  I-S/flutter valve -Recently completed chemotherapy follows with Dr. Liam Redhead  PT/OT-home health, face-to-face completed  DVT prophylaxis: Lovenox     Code Status: Full Code Family Communication: Spouse at bedside Status is: Inpatient Remains inpatient appropriate because: Hopefully discharge in next 24 hours   Subjective: Feels a little better Spouse is at bedside  Examination:  General exam: Appears calm and comfortable  Respiratory system: Clear to auscultation. Respiratory effort normal. Cardiovascular system: S1 & S2 heard, RRR. No JVD, murmurs, rubs, gallops or clicks. No pedal  edema. Gastrointestinal system: Abdomen is nondistended, soft and nontender. No organomegaly or masses felt. Normal bowel sounds heard. Central nervous system: Alert and oriented. No focal neurological deficits. Extremities: Symmetric 5 x 5 power. Skin: No rashes, lesions or ulcers Psychiatry: Judgement and insight appear normal. Mood & affect appropriate.                Diet Orders (From admission, onward)     Start     Ordered   02/18/24 1405  Diet regular Room service appropriate? Yes; Fluid consistency: Thin  Diet effective now       Question Answer Comment  Room service appropriate? Yes   Fluid consistency: Thin      02/18/24 1404            Objective: Vitals:   02/19/24 1030 02/19/24 1040 02/19/24 2003 02/20/24 0503  BP:  114/64 (!) 143/82 121/79  Pulse:  85 78 73  Resp:   20 20  Temp:   98.8 F (37.1 C) 97.8 F (36.6 C)  TempSrc:   Oral Oral  SpO2: 99% 92% 100% 96%  Weight:      Height:        Intake/Output Summary (Last 24 hours) at 02/20/2024 1110 Last data filed at 02/20/2024 0747 Gross per 24 hour  Intake 400 ml  Output 1425 ml  Net -1025 ml   Filed Weights   02/18/24 0807  Weight: 87.1 kg    Scheduled Meds:  atorvastatin   20 mg Oral Daily   Chlorhexidine  Gluconate Cloth  6 each Topical Daily   enoxaparin  (LOVENOX ) injection  40 mg Subcutaneous QHS   fluticasone  furoate-vilanterol  1 puff Inhalation Daily  gabapentin   300 mg Oral BID   pantoprazole   40 mg Oral Q1200   Continuous Infusions:  ceFEPime (MAXIPIME) IV 2 g (02/20/24 0747)    Nutritional status     Body mass index is 26.78 kg/m.  Data Reviewed:   CBC: Recent Labs  Lab 02/17/24 1725 02/18/24 1036 02/19/24 0545 02/20/24 0501  WBC 16.7* 18.1* 9.8 7.0  NEUTROABS 14.3* 16.0*  --   --   HGB 9.5* 8.8* 8.8* 8.5*  HCT 29.3* 27.3* 27.4* 25.9*  MCV 100.3* 104.2* 101.1* 100.4*  PLT 253 182 181 160   Basic Metabolic Panel: Recent Labs  Lab 02/17/24 1725  02/18/24 1036 02/19/24 0545 02/20/24 0501  NA 133* 130* 132* 131*  K 4.1 4.2 3.7 3.3*  CL 97* 97* 99 99  CO2 27 27 26 25   GLUCOSE 154* 172* 115* 97  BUN 12 18 18 15   CREATININE 1.20 1.44* 1.31* 1.03  CALCIUM  8.1* 7.6* 7.9* 7.7*  MG  --  2.2  --  2.2   GFR: Estimated Creatinine Clearance: 71.1 mL/min (by C-G formula based on SCr of 1.03 mg/dL). Liver Function Tests: Recent Labs  Lab 02/17/24 1725 02/18/24 1036  AST 21 16  ALT 10 10  ALKPHOS 73 66  BILITOT 0.3 1.0  PROT 5.2* 4.7*  ALBUMIN  2.5* 2.3*   No results for input(s): "LIPASE", "AMYLASE" in the last 168 hours. No results for input(s): "AMMONIA" in the last 168 hours. Coagulation Profile: Recent Labs  Lab 02/18/24 1035  INR 1.4*   Cardiac Enzymes: No results for input(s): "CKTOTAL", "CKMB", "CKMBINDEX", "TROPONINI" in the last 168 hours. BNP (last 3 results) No results for input(s): "PROBNP" in the last 8760 hours. HbA1C: No results for input(s): "HGBA1C" in the last 72 hours. CBG: Recent Labs  Lab 02/18/24 0823  GLUCAP 145*   Lipid Profile: No results for input(s): "CHOL", "HDL", "LDLCALC", "TRIG", "CHOLHDL", "LDLDIRECT" in the last 72 hours. Thyroid  Function Tests: No results for input(s): "TSH", "T4TOTAL", "FREET4", "T3FREE", "THYROIDAB" in the last 72 hours. Anemia Panel: No results for input(s): "VITAMINB12", "FOLATE", "FERRITIN", "TIBC", "IRON", "RETICCTPCT" in the last 72 hours. Sepsis Labs: Recent Labs  Lab 02/17/24 1736 02/17/24 2002 02/18/24 1043  LATICACIDVEN 2.0* 1.1 1.2    Recent Results (from the past 240 hours)  Blood Culture (routine x 2)     Status: None (Preliminary result)   Collection Time: 02/17/24  5:20 PM   Specimen: BLOOD RIGHT FOREARM  Result Value Ref Range Status   Specimen Description   Final    BLOOD RIGHT FOREARM Performed at [facility], [address] [place], [place] [address]    Special Requests   Final    BOTTLES DRAWN AEROBIC AND  ANAEROBIC Blood Culture adequate volume Performed at [facility], [address] [place], [place] [address]    Culture   Final    NO GROWTH 3 DAYS Performed at [facility], [address], [place], [place] [address]    Report Status PENDING  Incomplete  Blood Culture (routine x 2)     Status: Abnormal   Collection Time: 02/17/24  5:25 PM   Specimen: Left Antecubital; Blood  Result Value Ref Range Status   Specimen Description   Final    LEFT ANTECUBITAL Performed at [facility], [address] [place], [place] [address]    Special Requests   Final    BOTTLES DRAWN AEROBIC AND ANAEROBIC Blood Culture results may not  be optimal due to an inadequate volume of blood received in culture bottles Performed at [facility], [address], [place], [place] [address]    Culture  Setup Time   Final    GRAM NEGATIVE RODS ANAEROBIC BOTTLE ONLY CRITICAL RESULT CALLED TO, READ BACK BY AND VERIFIED WITH: [facility] CHRISTINE SHADE [id number] AT 1629 BY EC Performed at [facility], [address], [place], [place] [address]    Culture PSEUDOMONAS AERUGINOSA (A)  Final   Report Status 02/20/2024 FINAL  Final   Organism ID, Bacteria PSEUDOMONAS AERUGINOSA  Final      Susceptibility   Pseudomonas aeruginosa - MIC*    CEFTAZIDIME 4 SENSITIVE Sensitive     CIPROFLOXACIN  <=0.25 SENSITIVE Sensitive     GENTAMICIN  2 SENSITIVE Sensitive     IMIPENEM 1 SENSITIVE Sensitive     PIP/TAZO 8 SENSITIVE Sensitive ug/mL    CEFEPIME  2 SENSITIVE Sensitive     * PSEUDOMONAS AERUGINOSA  Blood Culture ID Panel (Reflexed)     Status: Abnormal   Collection Time: 02/17/24  5:25 PM  Result Value Ref Range Status   Enterococcus faecalis NOT DETECTED NOT DETECTED Final   Enterococcus Faecium NOT DETECTED NOT DETECTED Final   Listeria monocytogenes NOT DETECTED NOT DETECTED Final   Staphylococcus species NOT DETECTED NOT DETECTED Final    Staphylococcus aureus (BCID) NOT DETECTED NOT DETECTED Final   Staphylococcus epidermidis NOT DETECTED NOT DETECTED Final   Staphylococcus lugdunensis NOT DETECTED NOT DETECTED Final   Streptococcus species NOT DETECTED NOT DETECTED Final   Streptococcus agalactiae NOT DETECTED NOT DETECTED Final   Streptococcus pneumoniae NOT DETECTED NOT DETECTED Final   Streptococcus pyogenes NOT DETECTED NOT DETECTED Final   A.calcoaceticus-baumannii NOT DETECTED NOT DETECTED Final   Bacteroides fragilis NOT DETECTED NOT DETECTED Final   Enterobacterales NOT DETECTED NOT DETECTED Final   Enterobacter cloacae complex NOT DETECTED NOT DETECTED Final   Escherichia coli NOT DETECTED NOT DETECTED Final   Klebsiella aerogenes NOT DETECTED NOT DETECTED Final   Klebsiella oxytoca NOT DETECTED NOT DETECTED Final   Klebsiella pneumoniae NOT DETECTED NOT DETECTED Final   Proteus species NOT DETECTED NOT DETECTED Final   Salmonella species NOT DETECTED NOT DETECTED Final   Serratia marcescens NOT DETECTED NOT DETECTED Final   Haemophilus influenzae NOT DETECTED NOT DETECTED Final   Neisseria meningitidis NOT DETECTED NOT DETECTED Final   Pseudomonas aeruginosa DETECTED (A) NOT DETECTED Final    Comment: CRITICAL RESULT CALLED TO, READ BACK BY AND VERIFIED WITH: [facility] CHRISTINE SHADE [id number] AT 1629 BY EC    Stenotrophomonas maltophilia NOT DETECTED NOT DETECTED Final   Candida albicans NOT DETECTED NOT DETECTED Final   Candida auris NOT DETECTED NOT DETECTED Final   Candida glabrata NOT DETECTED NOT DETECTED Final   Candida krusei NOT DETECTED NOT DETECTED Final   Candida parapsilosis NOT DETECTED NOT DETECTED Final   Candida tropicalis NOT DETECTED NOT DETECTED Final   Cryptococcus neoformans/gattii NOT DETECTED NOT DETECTED Final   CTX-M ESBL NOT DETECTED NOT DETECTED Final   Carbapenem resistance IMP NOT DETECTED NOT DETECTED Final   Carbapenem resistance KPC NOT DETECTED NOT DETECTED Final    Carbapenem resistance NDM NOT DETECTED NOT DETECTED Final   Carbapenem resistance VIM NOT DETECTED NOT DETECTED Final    Comment: Performed at [facility], [address], [place], [place] [address]  Urine Culture     Status: Abnormal   Collection Time: 02/17/24  6:24 PM  Specimen: Urine, Random  Result Value Ref Range Status   Specimen Description   Final    URINE, RANDOM Performed at [facility], [address] [place], [place] [address]    Special Requests   Final    NONE Reflexed from [id number] Performed at [facility], [address] [place], [place] [address]    Culture >=100,000 COLONIES/mL PSEUDOMONAS AERUGINOSA (A)  Final   Report Status 02/19/2024 FINAL  Final   Organism ID, Bacteria PSEUDOMONAS AERUGINOSA (A)  Final      Susceptibility   Pseudomonas aeruginosa - MIC*    CEFTAZIDIME 4 SENSITIVE Sensitive     CIPROFLOXACIN  <=0.25 SENSITIVE Sensitive     GENTAMICIN  <=1 SENSITIVE Sensitive     IMIPENEM 1 SENSITIVE Sensitive     PIP/TAZO 8 SENSITIVE Sensitive ug/mL    CEFEPIME 2 SENSITIVE Sensitive     * >=100,000 COLONIES/mL PSEUDOMONAS AERUGINOSA  Resp panel by RT-PCR (RSV, Flu A&B, Covid) Anterior Nasal Swab     Status: None   Collection Time: 02/18/24  9:42 AM   Specimen: Anterior Nasal Swab  Result Value Ref Range Status   SARS Coronavirus 2 by RT PCR NEGATIVE NEGATIVE Final    Comment: (NOTE) SARS-CoV-2 target nucleic acids are NOT DETECTED.  The SARS-CoV-2 RNA is generally detectable in upper respiratory specimens during the acute phase of infection. The lowest concentration of SARS-CoV-2 viral copies this assay can detect is 138 copies/mL. A negative result does not preclude SARS-Cov-2 infection and should not be used as the sole basis for treatment or other patient management decisions. A negative result may occur with  improper specimen collection/handling, submission of specimen other than nasopharyngeal swab,  presence of viral mutation(s) within the areas targeted by this assay, and inadequate number of viral copies(<138 copies/mL). A negative result must be combined with clinical observations, patient history, and epidemiological information. The expected result is Negative.  Fact Sheet for Patients:  BloggerCourse.com  Fact Sheet for Healthcare Providers:  SeriousBroker.it  This test is no t yet approved or cleared by the [place]  FDA and  has been authorized for detection and/or diagnosis of SARS-CoV-2 by FDA under an Emergency Use Authorization (EUA). This EUA will remain  in effect (meaning this test can be used) for the duration of the COVID-19 declaration under Section 564(b)(1) of the Act, 21 U.S.C.section 360bbb-3(b)(1), unless the authorization is terminated  or revoked sooner.       Influenza A by PCR NEGATIVE NEGATIVE Final   Influenza B by PCR NEGATIVE NEGATIVE Final    Comment: (NOTE) The Xpert Xpress SARS-CoV-2/FLU/RSV plus assay is intended as an aid in the diagnosis of influenza from Nasopharyngeal swab specimens and should not be used as a sole basis for treatment. Nasal washings and aspirates are unacceptable for Xpert Xpress SARS-CoV-2/FLU/RSV testing.  Fact Sheet for Patients: BloggerCourse.com  Fact Sheet for Healthcare Providers: SeriousBroker.it  This test is not yet approved or cleared by the [place]  FDA and has been authorized for detection and/or diagnosis of SARS-CoV-2 by FDA under an Emergency Use Authorization (EUA). This EUA will remain in effect (meaning this test can be used) for the duration of the COVID-19 declaration under Section 564(b)(1) of the Act, 21 U.S.C. section 360bbb-3(b)(1), unless the authorization is terminated or revoked.     Resp Syncytial Virus by PCR NEGATIVE NEGATIVE Final    Comment: (NOTE) Fact Sheet for  Patients: BloggerCourse.com  Fact Sheet for Healthcare Providers: SeriousBroker.it  This test  is not yet approved or cleared by the [place]  FDA and has been authorized for detection and/or diagnosis of SARS-CoV-2 by FDA under an Emergency Use Authorization (EUA). This EUA will remain in effect (meaning this test can be used) for the duration of the COVID-19 declaration under Section 564(b)(1) of the Act, 21 U.S.C. section 360bbb-3(b)(1), unless the authorization is terminated or revoked.  Performed at [facility], [address] [place], [place] [address]   Urine Culture     Status: Abnormal   Collection Time: 02/18/24  9:54 AM   Specimen: Urine, Random  Result Value Ref Range Status   Specimen Description   Final    URINE, RANDOM Performed at [facility], [address] [place], [place] [address]    Special Requests   Final    NONE Reflexed from [id number] Performed at [facility], [address], [place], [place] [address]    Culture >=100,000 COLONIES/mL PSEUDOMONAS AERUGINOSA (A)  Final   Report Status 02/20/2024 FINAL  Final   Organism ID, Bacteria PSEUDOMONAS AERUGINOSA (A)  Final      Susceptibility   Pseudomonas aeruginosa - MIC*    CEFTAZIDIME 4 SENSITIVE Sensitive     CIPROFLOXACIN  0.5 SENSITIVE Sensitive     GENTAMICIN  2 SENSITIVE Sensitive     IMIPENEM 1 SENSITIVE Sensitive     PIP/TAZO 8 SENSITIVE Sensitive ug/mL    CEFEPIME 2 SENSITIVE Sensitive     * >=100,000 COLONIES/mL PSEUDOMONAS AERUGINOSA  Culture, blood (Routine X 2) w Reflex to ID Panel     Status: None (Preliminary result)   Collection Time: 02/19/24  5:45 AM   Specimen: BLOOD RIGHT ARM  Result Value Ref Range Status   Specimen Description   Final    BLOOD RIGHT ARM Performed at [facility], [address] [place], [place] [address]    Special Requests   Final    BOTTLES  DRAWN AEROBIC AND ANAEROBIC Blood Culture adequate volume Performed at [facility], [address], [place], [place] [address]    Culture   Final    NO GROWTH 1 DAY Performed at [facility], [address], [place], [place] [address]    Report Status PENDING  Incomplete  Culture, blood (Routine X 2) w Reflex to ID Panel     Status: None (Preliminary result)   Collection Time: 02/19/24  5:45 AM   Specimen: BLOOD RIGHT ARM  Result Value Ref Range Status   Specimen Description   Final    BLOOD RIGHT ARM Performed at [facility], [address] [place], [place] [address]    Special Requests   Final    BOTTLES DRAWN AEROBIC AND ANAEROBIC Blood Culture results may not be optimal due to an inadequate volume of blood received in culture bottles Performed at [facility], [address], [place], [place] [address]    Culture   Final    NO GROWTH 1 DAY Performed at [facility], [address] [place], [place] [address]    Report Status PENDING  Incomplete         Radiology Studies: MR BRAIN WO CONTRAST Result Date: 02/18/2024 CLINICAL DATA:  Acute stroke suspected EXAM: MRI HEAD WITHOUT CONTRAST TECHNIQUE: Multiplanar, multiecho pulse sequences of the brain and surrounding structures were obtained without intravenous contrast. COMPARISON:  02/18/2024 head CT FINDINGS: Brain: No acute infarction, hemorrhage, hydrocephalus, extra-axial collection or mass lesion.  Small chronic infarcts in the bilateral occipital parietal cortex. Mild chronic white matter disease. Preserved brain volume for age. Vascular: Normal flow voids. Skull and upper cervical spine: Normal marrow signal. Sinuses/Orbits: Mild patchy opacification of frontal ethmoidal sinuses, chronic. Bilateral cataract resection. IMPRESSION: No acute or interval finding. Small chronic bilateral parietooccipital infarcts. Electronically Signed   By: Ronnette Coke M.D.   On:  02/18/2024 12:42   CT Angio Chest PE W and/or Wo Contrast Result Date: 02/18/2024 CLINICAL DATA:  Sepsis.  Syncope.  History of lung cancer. EXAM: CT ANGIOGRAPHY CHEST CT ABDOMEN AND PELVIS WITH CONTRAST TECHNIQUE: Multidetector CT imaging of the chest was performed using the standard protocol during bolus administration of intravenous contrast. Multiplanar CT image reconstructions and MIPs were obtained to evaluate the vascular anatomy. Multidetector CT imaging of the abdomen and pelvis was performed using the standard protocol during bolus administration of intravenous contrast. RADIATION DOSE REDUCTION: This exam was performed according to the departmental dose-optimization program which includes automated exposure control, adjustment of the mA and/or kV according to patient size and/or use of iterative reconstruction technique. CONTRAST:  OMNIPAQUE  IOHEXOL  350 MG/ML SOLN COMPARISON:  PET-CT 08/02/2023. FINDINGS: CTA CHEST FINDINGS Cardiovascular: Satisfactory opacification of the pulmonary arteries to the segmental level. No evidence of pulmonary embolism. Normal heart size. No pericardial effusion. Aortic atherosclerosis and multi vessel coronary artery calcifications. Mediastinum/Nodes: No enlarged mediastinal, hilar, or axillary lymph nodes. Thyroid  gland, trachea, and esophagus demonstrate no significant findings. Lungs/Pleura: Status post left upper lobectomy. Small left pleural effusion. Emphysema with diffuse bronchial wall thickening. No airspace consolidation, atelectasis or pneumothorax. Musculoskeletal: No chest wall abnormality. No acute or significant osseous findings. Review of the MIP images confirms the above findings. CT ABDOMEN and PELVIS FINDINGS Hepatobiliary: No focal liver abnormality. Status post cholecystectomy. No bile duct dilatation. Pancreas: Unremarkable. No pancreatic ductal dilatation or surrounding inflammatory changes. Spleen: Normal in size without focal abnormality.  Adrenals/Urinary Tract: Normal adrenal glands. No nephrolithiasis or hydronephrosis. Cyst off the posterior cortex of left kidney measures 2.1 cm, image 34/5. No follow-up imaging recommended. There is asymmetric right-sided perinephric soft tissue stranding. Striated nephrograms identified within the upper and lower pole of the right kidney. No obstructive uropathy identified bilaterally. Thick walled urinary bladder is decompressed around a Foley catheter. Stomach/Bowel: Stomach is within normal limits. Status post appendectomy. No evidence of bowel wall thickening, distention, or inflammatory changes. Vascular/Lymphatic: Aortic atherosclerosis. 4.4 cm infrarenal abdominal aortic aneurysm, image 41/5. No abdominopelvic adenopathy. Reproductive: Status post prostatectomy. Other: No significant free fluid or fluid collections. Musculoskeletal: No acute or significant osseous findings. Lumbar spondylosis. Schmorl's node deformity within the superior endplate of L4. Review of the MIP images confirms the above findings. IMPRESSION: 1. No evidence for acute pulmonary embolus. 2. Small left pleural effusion. 3. Emphysema with diffuse bronchial wall thickening. 4. There is asymmetric right-sided striated nephrograms with perinephric fat stranding. Correlate for clinical signs or symptoms of pyelonephritis. 5. Thick-walled urinary bladder decompressed around a Foley catheter. Correlate for any signs or symptoms of cystitis. 6. 4.4 cm infrarenal abdominal aortic aneurysm. Recommend follow-up CT or MR as appropriate in 12 months and referral to or continued care with vascular specialist. (Ref.: J Vasc Surg. 2018; 67:2-77 and J Am Coll Radiol 2013;10(10):789-794.) 7. Coronary artery calcifications. 8. Aortic Atherosclerosis (ICD10-I70.0) and Emphysema (ICD10-J43.9). Electronically Signed   By: Kimberley Penman M.D.   On: 02/18/2024 12:28   CT ABDOMEN PELVIS W CONTRAST Result Date: 02/18/2024 CLINICAL DATA:  Sepsis.  Syncope.  History of lung cancer. EXAM: CT ANGIOGRAPHY CHEST CT ABDOMEN AND PELVIS WITH CONTRAST TECHNIQUE: Multidetector CT imaging of the chest was performed using the standard protocol during bolus administration of intravenous contrast. Multiplanar CT image reconstructions and MIPs were obtained to evaluate the vascular anatomy. Multidetector CT imaging of the abdomen and pelvis was performed using the standard protocol during bolus administration of intravenous contrast. RADIATION DOSE REDUCTION: This exam was performed according to the departmental dose-optimization program which includes automated exposure control, adjustment of the mA and/or kV according to patient size and/or use of iterative reconstruction technique. CONTRAST:  OMNIPAQUE  IOHEXOL  350 MG/ML SOLN COMPARISON:  PET-CT 08/02/2023. FINDINGS: CTA CHEST FINDINGS Cardiovascular: Satisfactory opacification of the pulmonary arteries to the segmental level. No evidence of pulmonary embolism. Normal heart size. No pericardial effusion. Aortic atherosclerosis and multi vessel coronary artery calcifications. Mediastinum/Nodes: No enlarged mediastinal, hilar, or axillary lymph nodes. Thyroid  gland, trachea, and esophagus demonstrate no significant findings. Lungs/Pleura: Status post left upper lobectomy. Small left pleural effusion. Emphysema with diffuse bronchial wall thickening. No airspace consolidation, atelectasis or pneumothorax. Musculoskeletal: No chest wall abnormality. No acute or significant osseous findings. Review of the MIP images confirms the above findings. CT ABDOMEN and PELVIS FINDINGS Hepatobiliary: No focal liver abnormality. Status post cholecystectomy. No bile duct dilatation. Pancreas: Unremarkable. No pancreatic ductal dilatation or surrounding inflammatory changes. Spleen: Normal in size without focal abnormality. Adrenals/Urinary Tract: Normal adrenal glands. No nephrolithiasis or hydronephrosis. Cyst off the posterior cortex of  left kidney measures 2.1 cm, image 34/5. No follow-up imaging recommended. There is asymmetric right-sided perinephric soft tissue stranding. Striated nephrograms identified within the upper and lower pole of the right kidney. No obstructive uropathy identified bilaterally. Thick walled urinary bladder is decompressed around a Foley catheter. Stomach/Bowel: Stomach is within normal limits. Status post appendectomy. No evidence of bowel wall thickening, distention, or inflammatory changes. Vascular/Lymphatic: Aortic atherosclerosis. 4.4 cm infrarenal abdominal aortic aneurysm, image 41/5. No abdominopelvic adenopathy. Reproductive: Status post prostatectomy. Other: No significant free fluid or fluid collections. Musculoskeletal: No acute or significant osseous findings. Lumbar spondylosis. Schmorl's node deformity within the superior endplate of L4. Review of the MIP images confirms the above findings. IMPRESSION: 1. No evidence for acute pulmonary embolus. 2. Small left pleural effusion. 3. Emphysema with diffuse bronchial wall thickening. 4. There is asymmetric right-sided striated nephrograms with perinephric fat stranding. Correlate for clinical signs or symptoms of pyelonephritis. 5. Thick-walled urinary bladder decompressed around a Foley catheter. Correlate for any signs or symptoms of cystitis. 6. 4.4 cm infrarenal abdominal aortic aneurysm. Recommend follow-up CT or MR as appropriate in 12 months and referral to or continued care with vascular specialist. (Ref.: J Vasc Surg. 2018; 67:2-77 and J Am Coll Radiol 2013;10(10):789-794.) 7. Coronary artery calcifications. 8. Aortic Atherosclerosis (ICD10-I70.0) and Emphysema (ICD10-J43.9). Electronically Signed   By: Kimberley Penman M.D.   On: 02/18/2024 12:28   CT Head Wo Contrast Result Date: 02/18/2024 CLINICAL DATA:  Syncope/presyncope with cerebrovascular cause suspected EXAM: CT HEAD WITHOUT CONTRAST TECHNIQUE: Contiguous axial images were obtained from the  base of the skull through the vertex without intravenous contrast. RADIATION DOSE REDUCTION: This exam was performed according to the departmental dose-optimization program which includes automated exposure control, adjustment of the mA and/or kV according to patient size and/or use of iterative reconstruction technique. COMPARISON:  09/02/2023 brain MRI FINDINGS: Brain: No evidence of acute infarction, hemorrhage, hydrocephalus, extra-axial collection or mass lesion/mass effect. Small chronic infarct in the parasagittal right more than left parietooccipital  cortex Vascular: No hyperdense vessel or unexpected calcification. Skull: Normal. Negative for fracture or focal lesion. Sinuses/Orbits: No acute finding. 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 Electronically Signed   By: Ronnette Coke M.D.   On: 02/18/2024 11:58           LOS: 2 days   Time spent= 35 mins    Maggie Schooner, MD [facility]  If 7PM-7AM, please contact night-coverage  02/20/2024, 11:10 AM

## 2024-02-20 NOTE — Progress Notes (Signed)
 Mobility Specialist - Progress Note   02/20/24 1133  Mobility  Activity Ambulated with assistance in hallway  Level of Assistance Contact guard assist, steadying assist  Assistive Device None  Distance Ambulated (ft) 400 ft  Range of Motion/Exercises Active  Activity Response Tolerated fair  Mobility Referral Yes  Mobility visit 1 Mobility  Mobility Specialist Start Time (ACUTE ONLY) 1120  Mobility Specialist Stop Time (ACUTE ONLY) 1133  Mobility Specialist Time Calculation (min) (ACUTE ONLY) 13 min   Pt was found in bed and agreeable to ambulate. Had some knee buckling during session stated still feeling weak. Had x1 brief seated rest break due to bucking. At EOS returned to recliner chair with all needs met. Call bell in reach and chair alarm on.   Cameron [name],  Mobility Specialist Can be reached via Secure Chat

## 2024-02-20 NOTE — Telephone Encounter (Signed)
 Post ED Visit - Positive Culture Follow-up  Culture report reviewed by antimicrobial stewardship pharmacist: [facility] []  Court Distance, Pharm.D. []  Skeet Duke, Pharm.D., BCPS AQ-ID []  Leslee Rase, Pharm.D., BCPS []  Garland Junk, [address].D., BCPS []  [place], [address].D., BCPS, AAHIVP []  Alcide Aly, Pharm.D., BCPS, AAHIVP []  Jerri Morale, PharmD, BCPS []  Graham Laws, PharmD, BCPS []  Cleda Curly, PharmD, BCPS []  Tamar Fairly, PharmD []  Ballard Levels, PharmD, BCPS []  Ollen Beverage, PharmD  [facility] []  Arlyne Bering, PharmD []  Sherryle Don, PharmD [x]  Van Gelinas, PharmD []  Delila Felty, Rph []  [place]) Cleora Daft, PharmD []  Augustina Block, PharmD []  Arie Kurtz, PharmD []  Sharlyn Deaner, PharmD []  Agnes Hose, PharmD []  Kendall Pauls, PharmD []  Gladstone Lamer, PharmD []  Armanda Bern, PharmD []  Tera Fellows, PharmD   Positive urine culture Treated with cefepime, organism sensitive to the same and no further patient follow-up is required at this time.  Pt was admitted to [facility] and was treated with cefepime which is sensitive.  Zeb Heys 02/20/2024, 12:13 PM

## 2024-02-21 ENCOUNTER — Ambulatory Visit (HOSPITAL_COMMUNITY)

## 2024-02-21 ENCOUNTER — Other Ambulatory Visit (HOSPITAL_COMMUNITY): Payer: Self-pay

## 2024-02-21 ENCOUNTER — Other Ambulatory Visit

## 2024-02-21 ENCOUNTER — Inpatient Hospital Stay

## 2024-02-21 ENCOUNTER — Encounter: Payer: Self-pay | Admitting: Internal Medicine

## 2024-02-21 DIAGNOSIS — R6510 Systemic inflammatory response syndrome (SIRS) of non-infectious origin without acute organ dysfunction: Secondary | ICD-10-CM | POA: Diagnosis not present

## 2024-02-21 LAB — BASIC METABOLIC PANEL WITH GFR
Anion gap: 6 (ref 5–15)
BUN: 13 mg/dL (ref 8–23)
CO2: 25 mmol/L (ref 22–32)
Calcium: 7.6 mg/dL — ABNORMAL LOW (ref 8.9–10.3)
Chloride: 99 mmol/L (ref 98–111)
Creatinine, Ser: 0.85 mg/dL (ref 0.61–1.24)
GFR, Estimated: >60 mL/min (ref >60)
Glucose, Bld: 99 mg/dL (ref 70–99)
Potassium: 3.3 mmol/L — ABNORMAL LOW (ref 3.5–5.1)
Sodium: 130 mmol/L — ABNORMAL LOW (ref 135–145)

## 2024-02-21 LAB — CBC
HCT: 26.4 % — ABNORMAL LOW (ref 39.0–52.0)
Hemoglobin: 8.5 g/dL — ABNORMAL LOW (ref 13.0–17.0)
MCH: 32.6 pg (ref 26.0–34.0)
MCHC: 32.2 g/dL (ref 30.0–36.0)
MCV: 101.1 fL — ABNORMAL HIGH (ref 80.0–100.0)
Platelets: 179 10*3/uL (ref 150–400)
RBC: 2.61 MIL/uL — ABNORMAL LOW (ref 4.22–5.81)
RDW: 17.7 % — ABNORMAL HIGH (ref 11.5–15.5)
WBC: 5.9 10*3/uL (ref 4.0–10.5)
nRBC: 0 % (ref 0.0–0.2)

## 2024-02-21 LAB — MAGNESIUM: Magnesium: 2.2 mg/dL (ref 1.7–2.4)

## 2024-02-21 MED ORDER — PRECISION CATHETER URINE SYS KIT: 1.0000 | PACK | Freq: Three times a day (TID) | 2 refills | Status: DC

## 2024-02-21 MED ORDER — BISACODYL 5 MG PO TBEC
10.0000 mg | DELAYED_RELEASE_TABLET | Freq: Every day | ORAL | 0 refills | Status: DC | PRN
  Filled 2024-02-21: qty 30, 15d supply, fill #0

## 2024-02-21 MED ORDER — CIPROFLOXACIN HCL 500 MG PO TABS
500.0000 mg | ORAL_TABLET | Freq: Two times a day (BID) | ORAL | 0 refills | Status: DC
  Filled 2024-02-21: qty 6, 3d supply, fill #0

## 2024-02-21 MED ORDER — MAGNESIUM CITRATE PO SOLN
0.5000 | Freq: Once | ORAL | Status: AC
  Administered 2024-02-21: 0.5 via ORAL
  Filled 2024-02-21: qty 296

## 2024-02-21 MED ORDER — CIPROFLOXACIN HCL 500 MG PO TABS
750.0000 mg | ORAL_TABLET | Freq: Two times a day (BID) | ORAL | 0 refills | Status: AC
  Filled 2024-02-21: qty 21, 7d supply, fill #0

## 2024-02-21 MED ORDER — POTASSIUM CHLORIDE CRYS ER 20 MEQ PO TBCR
40.0000 meq | EXTENDED_RELEASE_TABLET | Freq: Once | ORAL | Status: AC
  Administered 2024-02-21: 40 meq via ORAL
  Filled 2024-02-21: qty 2

## 2024-02-21 MED ORDER — BISACODYL 5 MG PO TBEC
10.0000 mg | DELAYED_RELEASE_TABLET | Freq: Every day | ORAL | Status: DC
  Administered 2024-02-21: 10 mg via ORAL
  Filled 2024-02-21: qty 2

## 2024-02-21 NOTE — TOC Transition Note (Signed)
 Transition of Care [facility]) - Discharge Note   Patient Details  Name: Cameron [name] MRN: [id number] Date of Birth: Jan 13, 1951  Transition of [facility] CM/SW Contact:  Ruben Corolla, RN Phone Number: 02/21/2024, 10:51 AM   Clinical Narrative: [name] to [name](spouse) about d/c-agree to [facility] HHRN/PT/OT/aide-[employer] rep Imelda Man aware of [facility], The services ordered. Has own transport home. No further CM needs.      Final next level of care: Home w Home Health Services Barriers to Discharge: No Barriers Identified   Patient Goals and CMS Choice Patient states their goals for this hospitalization and ongoing recovery are:: Home CMS Medicare.gov Compare Post Acute Care list provided to:: Patient Represenative (must comment) ([name](spouse)) Choice offered to / list presented to : Spouse      Discharge Placement                       Discharge Plan and Services Additional resources added to the After Visit Summary for   In-house Referral: Clinical Social Work   Post Acute Care Choice: Home Health          DME Arranged: N/A DME Agency: NA       HH Arranged: RN, PT, OT, Nurse's Aide HH Agency: [facility] Date HH Agency Contacted: 02/21/24 Time HH Agency Contacted: 1051 Representative spoke with at [facility] Agency: Imelda Man  Social Drivers of Health (SDOH) Interventions SDOH Screenings   Food Insecurity: No Food Insecurity (02/18/2024)  Housing: Low Risk  (02/18/2024)  Transportation Needs: No Transportation Needs (02/18/2024)  Utilities: Not At Risk (02/18/2024)  Alcohol Screen: Low Risk  (11/21/2023)  Depression (PHQ2-9): High Risk (02/10/2024)  Financial Resource Strain: Low Risk  (11/21/2023)  Physical Activity: Sufficiently Active (11/21/2023)  Social Connections: Socially Integrated (02/18/2024)  Stress: No Stress Concern Present (11/21/2023)  Tobacco Use: High Risk (02/18/2024)  Health Literacy: Adequate Health Literacy (11/21/2023)     Readmission Risk Interventions     10/06/2023    8:57 AM  Readmission Risk Prevention Plan  Post Dischage Appt Complete  Medication Screening Complete  Transportation Screening Complete

## 2024-02-21 NOTE — Plan of Care (Signed)

## 2024-02-21 NOTE — Discharge Summary (Signed)
 Physician Discharge Summary  JOHANTHAN KNEELAND VWU:[id number] DOB: [birth year] DOA: 02/18/2024  PCP: Dorothe Gaster, NP  Admit date: 02/18/2024 Discharge date: 02/21/2024  Admitted From: Home Disposition: Home  Recommendations for Outpatient Follow-up:  Follow up with PCP in 1-2 weeks Please obtain BMP/CBC in one week your next doctors visit.  Cipro   750 mg twice daily for 7 more days to complete total 10-day course In-N-Out self-catheterization at least 3 times daily   Discharge Condition: Stable CODE STATUS: Full Diet recommendation: Low na  Brief/Interim Summary: Brief Narrative:  73 year old with history of COPD, HLD, HTN, lung cancer recently completed chemotherapy admitted for fever of unknown etiology.  Came to the ER day prior to admission and was discharged with empiric Augmentin  but then he had a syncopal episode therefore ended up coming back to the hospital.  Patient underwent syncope evaluation during the hospitalization.  MRI brain and orthostatic was negative.  Recent echocardiogram also showed EF of 65% In terms of his urinary tract infection, he ended up growing Pseudomonas which was sensitive to few antibiotics.  Initially treated with cefepime thereafter transition to Cipro .  ID pharmacy recommended 750 mg twice daily for 7 more days to complete total 10-day course. Of note he developed urinary retention on 5/16 and has had Foley catheter in place since.  Foley catheter removed.  Have advised the patient does not require at home minimum 3 times a day self-catheterization to avoid chronic urinary retention. He has patient follow-up appointment with PCP and urology.  Medically stable for discharge today.   Assessment & Plan:  Principal Problem:   SIRS (systemic inflammatory response syndrome) (HCC)     Sepsis secondary to urinary tract infection UTI, catheter associated - SePSIS secondary to urinary tract infection.  Urine cultures from recent ER visit and during this  admission both are growing Pseudomonas.  We can continue IV cefepime for now and upon discharge we can transition to p.o. Cipro  -Suspect catheter associated UTI.  Had Foley PTA from recent ER visit from 9 days ago for urinary retention.  Foley catheter discontinued.  Due to chronic retention, has been advised to self In-N-Out catheterization at least 3 times daily.  Syncope/falls, resolved - Suspect from underlying infection.  MRI brain, orthostaticsAre unremarkable.  He is essentially now well-hydrated and AKI has resolved.  Seen by physical therapy-recommending home health services - Echocardiogram 01/2024 shows EF 65%, grade 1 DD  Hypokalemia - Repletion   GERD -omeprazole   Hypertension Resume home occasions   Hyperlipidemia -Lipitor   Neuropathy -gabapentin    COPD Stage II non-small cell lung cancer -Home bronchodilators.  I-S/flutter valve -Recently completed chemotherapy follows with Dr. Liam Redhead  PT/OT-home health, face-to-face completed  DVT prophylaxis: Lovenox     Code Status: Full Code Family Communication: Spouse at bedside Status is: Inpatient Remains inpatient appropriate because: Hopefully discharge in next 24 hours   Subjective: Seen at bedside no other complaints at this time.  Feeling well  Examination:  General exam: Appears calm and comfortable  Respiratory system: Clear to auscultation. Respiratory effort normal. Cardiovascular system: S1 & S2 heard, RRR. No JVD, murmurs, rubs, gallops or clicks. No pedal edema. Gastrointestinal system: Abdomen is nondistended, soft and nontender. No organomegaly or masses felt. Normal bowel sounds heard. Central nervous system: Alert and oriented. No focal neurological deficits. Extremities: Symmetric 5 x 5 power. Skin: No rashes, lesions or ulcers Psychiatry: Judgement and insight appear normal. Mood & affect appropriate.    Discharge Diagnoses:  Principal Problem:  SIRS (systemic inflammatory response  syndrome) (HCC)      Discharge Exam: Vitals:   02/21/24 0435 02/21/24 0914  BP: 127/78   Pulse: 68   Resp: 15   Temp: 98 F (36.7 C)   SpO2: 96% 97%   Vitals:   02/20/24 1227 02/20/24 2136 02/21/24 0435 02/21/24 0914  BP: 123/75 (!) 143/85 127/78   Pulse: 69 71 68   Resp: 16  15   Temp: 98 F (36.7 C) 98.1 F (36.7 C) 98 F (36.7 C)   TempSrc:  Oral Oral   SpO2: 99% 98% 96% 97%  Weight:      Height:          Discharge Instructions   Allergies as of 02/21/2024       Reactions   Thorazine  [chlorpromazine ] Other (See Comments)   Passed out        Medication List     STOP taking these medications    amoxicillin -clavulanate 875-125 MG tablet Commonly known as: AUGMENTIN    dexamethasone  4 MG tablet Commonly known as: DECADRON    ibuprofen 800 MG tablet Commonly known as: ADVIL   metoCLOPramide  5 MG tablet Commonly known as: Reglan    ondansetron  8 MG tablet Commonly known as: Zofran    oxyCODONE  5 MG immediate release tablet Commonly known as: Oxy IR/ROXICODONE        TAKE these medications    Acetaminophen  Extra Strength 500 MG Tabs Take 1-2 tablets (500-1,000 mg total) by mouth every 6 (six) hours as needed. What changed: reasons to take this   amiodarone  200 MG tablet Commonly known as: PACERONE  Take 1 tablet (200 mg total) by mouth 2 (two) times daily for 10 days, then decrease to 1 tablet (200 mg) daily.   atorvastatin  20 MG tablet Commonly known as: LIPITOR Take 1 tablet (20 mg total) by mouth daily.   bisacodyl  5 MG EC tablet Commonly known as: DULCOLAX Take 2 tablets (10 mg total) by mouth daily as needed for moderate constipation.   ciprofloxacin  500 MG tablet Commonly known as: Cipro  Take 1.5 tablets (750 mg total) by mouth 2 (two) times daily for 7 days.   dorzolamide  2 % ophthalmic solution Commonly known as: TRUSOPT  Administer into right eye twice a day.   fluticasone  50 MCG/ACT nasal spray Commonly known as:  FLONASE  Place 1 spray into both nostrils 2 (two) times daily as needed for allergies or rhinitis.   fluticasone -salmeterol 250-50 MCG/ACT Aepb Commonly known as: Advair  Diskus Inhale 1 puff into the lungs in the morning and at bedtime.   gabapentin  300 MG capsule Commonly known as: Neurontin  Take 1 capsule (300 mg total) by mouth 2 (two) times daily. What changed: when to take this   hydrochlorothiazide  12.5 MG capsule Commonly known as: MICROZIDE  Take 12.5 mg by mouth daily.   hydrocortisone  ointment 0.5 % Apply 1 Application topically 2 (two) times daily.   lidocaine -prilocaine  cream Commonly known as: EMLA  Apply to the Port-A-Cath site 30-60 minutes before chemotherapy What changed:  how much to take how to take this when to take this reasons to take this additional instructions   magnesium  oxide 400 (240 Mg) MG tablet Commonly known as: MAG-OX Take 1 tablet (400 mg total) by mouth 3 (three) times daily.   pantoprazole  40 MG tablet Commonly known as: PROTONIX  Take 1 tablet (40 mg total) by mouth daily at 12 noon.   potassium chloride  SA 20 MEQ tablet Commonly known as: KLOR-CON  M Take 1 tablet (20 mEq total) by mouth  daily.   Precision Catheter Urine Sys Kit 1 Box by Does not apply route 3 (three) times daily.   valACYclovir 1000 MG tablet Commonly known as: VALTREX Take 1 tablet (1,000 mg total) by mouth 2 (two) times daily for 3 days. Start within 72 hours of the first signs of a flare. What changed:  how much to take when to take this        Follow-up Information     [facility]  Follow up.   Why: [facility] will provide PT/OT/nursing/aide in the home after discharge.        Dorothe Gaster, NP Follow up in 1 week(s).   Specialties: Nurse Practitioner, Family Medicine Contact information: [address] [place] [address] [phone]                Allergies  Allergen Reactions   Thorazine  [Chlorpromazine ] Other  (See Comments)    Passed out    You were cared for by a hospitalist during your hospital stay. If you have any questions about your discharge medications or the care you received while you were in the hospital after you are discharged, you can call the unit and asked to speak with the hospitalist on call if the hospitalist that took care of you is not available. Once you are discharged, your primary care physician will handle any further medical issues. Please note that no refills for any discharge medications will be authorized once you are discharged, as it is imperative that you return to your primary care physician (or establish a relationship with a primary care physician if you do not have one) for your aftercare needs so that they can reassess your need for medications and monitor your lab values.  You were cared for by a hospitalist during your hospital stay. If you have any questions about your discharge medications or the care you received while you were in the hospital after you are discharged, you can call the unit and asked to speak with the hospitalist on call if the hospitalist that took care of you is not available. Once you are discharged, your primary care physician will handle any further medical issues. Please note that NO REFILLS for any discharge medications will be authorized once you are discharged, as it is imperative that you return to your primary care physician (or establish a relationship with a primary care physician if you do not have one) for your aftercare needs so that they can reassess your need for medications and monitor your lab values.  Please request your Prim.MD to go over all Hospital Tests and Procedure/Radiological results at the follow up, please get all Hospital records sent to your Prim MD by signing hospital release before you go home.  Get CBC, CMP, 2 view Chest X ray checked  by Primary MD during your next visit or SNF MD in 5-7 days ( we routinely change  or add medications that can affect your baseline labs and fluid status, therefore we recommend that you get the mentioned basic workup next visit with your PCP, your PCP may decide not to get them or add new tests based on their clinical decision)  On your next visit with your primary care physician please Get Medicines reviewed and adjusted.  If you experience worsening of your admission symptoms, develop shortness of breath, life threatening emergency, suicidal or homicidal thoughts you must seek medical attention immediately by calling 911 or calling your MD immediately  if symptoms less  severe.  You Must read complete instructions/literature along with all the possible adverse reactions/side effects for all the Medicines you take and that have been prescribed to you. Take any new Medicines after you have completely understood and accpet all the possible adverse reactions/side effects.   Do not drive, operate heavy machinery, perform activities at heights, swimming or participation in water  activities or provide baby sitting services if your were admitted for syncope or siezures until you have seen by Primary MD or a Neurologist and advised to do so again.  Do not drive when taking Pain medications.   Procedures/Studies: MR BRAIN WO CONTRAST Result Date: 02/18/2024 CLINICAL DATA:  Acute stroke suspected EXAM: MRI HEAD WITHOUT CONTRAST TECHNIQUE: Multiplanar, multiecho pulse sequences of the brain and surrounding structures were obtained without intravenous contrast. COMPARISON:  02/18/2024 head CT FINDINGS: Brain: No acute infarction, hemorrhage, hydrocephalus, extra-axial collection or mass lesion. Small chronic infarcts in the bilateral occipital parietal cortex. Mild chronic white matter disease. Preserved brain volume for age. Vascular: Normal flow voids. Skull and upper cervical spine: Normal marrow signal. Sinuses/Orbits: Mild patchy opacification of frontal ethmoidal sinuses, chronic.  Bilateral cataract resection. IMPRESSION: No acute or interval finding. Small chronic bilateral parietooccipital infarcts. Electronically Signed   By: Ronnette Coke M.D.   On: 02/18/2024 12:42   CT Angio Chest PE W and/or Wo Contrast Result Date: 02/18/2024 CLINICAL DATA:  Sepsis.  Syncope.  History of lung cancer. EXAM: CT ANGIOGRAPHY CHEST CT ABDOMEN AND PELVIS WITH CONTRAST TECHNIQUE: Multidetector CT imaging of the chest was performed using the standard protocol during bolus administration of intravenous contrast. Multiplanar CT image reconstructions and MIPs were obtained to evaluate the vascular anatomy. Multidetector CT imaging of the abdomen and pelvis was performed using the standard protocol during bolus administration of intravenous contrast. RADIATION DOSE REDUCTION: This exam was performed according to the departmental dose-optimization program which includes automated exposure control, adjustment of the mA and/or kV according to patient size and/or use of iterative reconstruction technique. CONTRAST:  OMNIPAQUE  IOHEXOL  350 MG/ML SOLN COMPARISON:  PET-CT 08/02/2023. FINDINGS: CTA CHEST FINDINGS Cardiovascular: Satisfactory opacification of the pulmonary arteries to the segmental level. No evidence of pulmonary embolism. Normal heart size. No pericardial effusion. Aortic atherosclerosis and multi vessel coronary artery calcifications. Mediastinum/Nodes: No enlarged mediastinal, hilar, or axillary lymph nodes. Thyroid  gland, trachea, and esophagus demonstrate no significant findings. Lungs/Pleura: Status post left upper lobectomy. Small left pleural effusion. Emphysema with diffuse bronchial wall thickening. No airspace consolidation, atelectasis or pneumothorax. Musculoskeletal: No chest wall abnormality. No acute or significant osseous findings. Review of the MIP images confirms the above findings. CT ABDOMEN and PELVIS FINDINGS Hepatobiliary: No focal liver abnormality. Status post  cholecystectomy. No bile duct dilatation. Pancreas: Unremarkable. No pancreatic ductal dilatation or surrounding inflammatory changes. Spleen: Normal in size without focal abnormality. Adrenals/Urinary Tract: Normal adrenal glands. No nephrolithiasis or hydronephrosis. Cyst off the posterior cortex of left kidney measures 2.1 cm, image 34/5. No follow-up imaging recommended. There is asymmetric right-sided perinephric soft tissue stranding. Striated nephrograms identified within the upper and lower pole of the right kidney. No obstructive uropathy identified bilaterally. Thick walled urinary bladder is decompressed around a Foley catheter. Stomach/Bowel: Stomach is within normal limits. Status post appendectomy. No evidence of bowel wall thickening, distention, or inflammatory changes. Vascular/Lymphatic: Aortic atherosclerosis. 4.4 cm infrarenal abdominal aortic aneurysm, image 41/5. No abdominopelvic adenopathy. Reproductive: Status post prostatectomy. Other: No significant free fluid or fluid collections. Musculoskeletal: No acute or significant osseous findings. Lumbar spondylosis.  Schmorl's node deformity within the superior endplate of L4. Review of the MIP images confirms the above findings. IMPRESSION: 1. No evidence for acute pulmonary embolus. 2. Small left pleural effusion. 3. Emphysema with diffuse bronchial wall thickening. 4. There is asymmetric right-sided striated nephrograms with perinephric fat stranding. Correlate for clinical signs or symptoms of pyelonephritis. 5. Thick-walled urinary bladder decompressed around a Foley catheter. Correlate for any signs or symptoms of cystitis. 6. 4.4 cm infrarenal abdominal aortic aneurysm. Recommend follow-up CT or MR as appropriate in 12 months and referral to or continued care with vascular specialist. (Ref.: J Vasc Surg. 2018; 67:2-77 and J Am Coll Radiol 2013;10(10):789-794.) 7. Coronary artery calcifications. 8. Aortic Atherosclerosis (ICD10-I70.0) and  Emphysema (ICD10-J43.9). Electronically Signed   By: Kimberley Penman M.D.   On: 02/18/2024 12:28   CT ABDOMEN PELVIS W CONTRAST Result Date: 02/18/2024 CLINICAL DATA:  Sepsis.  Syncope.  History of lung cancer. EXAM: CT ANGIOGRAPHY CHEST CT ABDOMEN AND PELVIS WITH CONTRAST TECHNIQUE: Multidetector CT imaging of the chest was performed using the standard protocol during bolus administration of intravenous contrast. Multiplanar CT image reconstructions and MIPs were obtained to evaluate the vascular anatomy. Multidetector CT imaging of the abdomen and pelvis was performed using the standard protocol during bolus administration of intravenous contrast. RADIATION DOSE REDUCTION: This exam was performed according to the departmental dose-optimization program which includes automated exposure control, adjustment of the mA and/or kV according to patient size and/or use of iterative reconstruction technique. CONTRAST:  OMNIPAQUE  IOHEXOL  350 MG/ML SOLN COMPARISON:  PET-CT 08/02/2023. FINDINGS: CTA CHEST FINDINGS Cardiovascular: Satisfactory opacification of the pulmonary arteries to the segmental level. No evidence of pulmonary embolism. Normal heart size. No pericardial effusion. Aortic atherosclerosis and multi vessel coronary artery calcifications. Mediastinum/Nodes: No enlarged mediastinal, hilar, or axillary lymph nodes. Thyroid  gland, trachea, and esophagus demonstrate no significant findings. Lungs/Pleura: Status post left upper lobectomy. Small left pleural effusion. Emphysema with diffuse bronchial wall thickening. No airspace consolidation, atelectasis or pneumothorax. Musculoskeletal: No chest wall abnormality. No acute or significant osseous findings. Review of the MIP images confirms the above findings. CT ABDOMEN and PELVIS FINDINGS Hepatobiliary: No focal liver abnormality. Status post cholecystectomy. No bile duct dilatation. Pancreas: Unremarkable. No pancreatic ductal dilatation or surrounding  inflammatory changes. Spleen: Normal in size without focal abnormality. Adrenals/Urinary Tract: Normal adrenal glands. No nephrolithiasis or hydronephrosis. Cyst off the posterior cortex of left kidney measures 2.1 cm, image 34/5. No follow-up imaging recommended. There is asymmetric right-sided perinephric soft tissue stranding. Striated nephrograms identified within the upper and lower pole of the right kidney. No obstructive uropathy identified bilaterally. Thick walled urinary bladder is decompressed around a Foley catheter. Stomach/Bowel: Stomach is within normal limits. Status post appendectomy. No evidence of bowel wall thickening, distention, or inflammatory changes. Vascular/Lymphatic: Aortic atherosclerosis. 4.4 cm infrarenal abdominal aortic aneurysm, image 41/5. No abdominopelvic adenopathy. Reproductive: Status post prostatectomy. Other: No significant free fluid or fluid collections. Musculoskeletal: No acute or significant osseous findings. Lumbar spondylosis. Schmorl's node deformity within the superior endplate of L4. Review of the MIP images confirms the above findings. IMPRESSION: 1. No evidence for acute pulmonary embolus. 2. Small left pleural effusion. 3. Emphysema with diffuse bronchial wall thickening. 4. There is asymmetric right-sided striated nephrograms with perinephric fat stranding. Correlate for clinical signs or symptoms of pyelonephritis. 5. Thick-walled urinary bladder decompressed around a Foley catheter. Correlate for any signs or symptoms of cystitis. 6. 4.4 cm infrarenal abdominal aortic aneurysm. Recommend follow-up CT or MR as appropriate  in 12 months and referral to or continued care with vascular specialist. (Ref.: J Vasc Surg. 2018; 67:2-77 and J Am Coll Radiol 2013;10(10):789-794.) 7. Coronary artery calcifications. 8. Aortic Atherosclerosis (ICD10-I70.0) and Emphysema (ICD10-J43.9). Electronically Signed   By: Kimberley Penman M.D.   On: 02/18/2024 12:28   CT Head Wo  Contrast Result Date: 02/18/2024 CLINICAL DATA:  Syncope/presyncope with cerebrovascular cause suspected EXAM: CT HEAD WITHOUT CONTRAST TECHNIQUE: Contiguous axial images were obtained from the base of the skull through the vertex without intravenous contrast. RADIATION DOSE REDUCTION: This exam was performed according to the departmental dose-optimization program which includes automated exposure control, adjustment of the mA and/or kV according to patient size and/or use of iterative reconstruction technique. COMPARISON:  09/02/2023 brain MRI FINDINGS: Brain: No evidence of acute infarction, hemorrhage, hydrocephalus, extra-axial collection or mass lesion/mass effect. Small chronic infarct in the parasagittal right more than left parietooccipital cortex Vascular: No hyperdense vessel or unexpected calcification. Skull: Normal. Negative for fracture or focal lesion. Sinuses/Orbits: No acute finding. 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 Electronically Signed   By: Ronnette Coke M.D.   On: 02/18/2024 11:58   DG Chest Port 1 View Result Date: 02/18/2024 CLINICAL DATA:  Sepsis.  Evaluate for abnormality. EXAM: PORTABLE CHEST 1 VIEW COMPARISON:  02/17/2024 FINDINGS: There is a right chest wall port a catheter with tip in the distal SVC. Aortic atherosclerosis. Stable cardiomediastinal contours. No pleural fluid, interstitial edema or airspace disease. IMPRESSION: No acute cardiopulmonary abnormalities. Electronically Signed   By: Kimberley Penman M.D.   On: 02/18/2024 09:52   DG Chest Port 1 View Result Date: 02/17/2024 EXAM: 1 VIEW XRAY OF THE CHEST 02/17/2024 05:31:00 PM COMPARISON: 02/09/2024 CLINICAL HISTORY: Questionable sepsis - evaluate for abnormality. FINDINGS: LUNGS AND PLEURA: No focal pulmonary opacity. No pulmonary edema. No pleural effusion. No pneumothorax. HEART AND MEDIASTINUM: No acute abnormality of the cardiac and mediastinal silhouettes. Sclerotic  calcifications are present at the aortic arch. BONES AND SOFT TISSUES: No acute osseous abnormality. A right IJ port-a-cath is stable. IMPRESSION: 1. No acute findings. Electronically signed by: Audree Leas MD 02/17/2024 07:05 PM EDT RP Workstation: [id number]   DG Chest Portable 1 View Result Date: 02/09/2024 CLINICAL DATA:  weakness EXAM: PORTABLE CHEST - 1 VIEW COMPARISON:  02/02/2024. FINDINGS: Cardiac silhouette is unremarkable. No pneumothorax or pleural effusion. The lungs are clear. Aorta is calcified. The visualized skeletal structures are unremarkable. Right-sided Port-A-Cath tip overlies distal SVC. IMPRESSION: No acute cardiopulmonary process. Electronically Signed   By: Sydell Eva M.D.   On: 02/09/2024 22:18   ECHOCARDIOGRAM COMPLETE Result Date: 02/03/2024    ECHOCARDIOGRAM REPORT   Patient Name:   Alphonso A Steve Date of Exam: 02/03/2024 Medical Rec #:  [id number]    Height:       71.0 in Accession #:    [id number]   Weight:       180.0 lb Date of Birth:  September 26, 1951    BSA:          2.016 m Patient Age:    72 years     BP:           131/76 mmHg Patient Gender: M            HR:           89 bpm. Exam Location:  Inpatient Procedure: 2D Echo, Color Doppler and Cardiac Doppler (Both Spectral and Color            Flow  Doppler were utilized during procedure). Indications:    R55 Syncope  History:        Patient has no prior history of Echocardiogram examinations.                 CAD; Risk Factors:Hypertension.  Sonographer:    Andrena Bang Referring Phys: Nance Aw LAMA IMPRESSIONS  1. Possible small LVOT gradient but no SAM seen possibly due to poor image quality. Left ventricular ejection fraction, by estimation, is 65 to 70%. The left ventricle has normal function. The left ventricle has no regional wall motion abnormalities. There is moderate left ventricular hypertrophy. Left ventricular diastolic parameters are consistent with Grade I diastolic dysfunction (impaired relaxation).  2.  Right ventricular systolic function is normal. The right ventricular size is normal.  3. The mitral valve is normal in structure. No evidence of mitral valve regurgitation. No evidence of mitral stenosis.  4. The aortic valve is tricuspid. There is mild calcification of the aortic valve. There is mild thickening of the aortic valve. Aortic valve regurgitation is not visualized. Aortic valve sclerosis is present, with no evidence of aortic valve stenosis.  5. The inferior vena cava is normal in size with greater than 50% respiratory variability, suggesting right atrial pressure of 3 mmHg. FINDINGS  Left Ventricle: Possible small LVOT gradient but no SAM seen possibly due to poor image quality. Left ventricular ejection fraction, by estimation, is 65 to 70%. The left ventricle has normal function. The left ventricle has no regional wall motion abnormalities. Definity  contrast agent was given IV to delineate the left ventricular endocardial borders. Strain was performed and the global longitudinal strain is indeterminate. The left ventricular internal cavity size was normal in size. There is moderate left ventricular hypertrophy. Left ventricular diastolic parameters are consistent with Grade I diastolic dysfunction (impaired relaxation). Right Ventricle: The right ventricular size is normal. No increase in right ventricular wall thickness. Right ventricular systolic function is normal. Left Atrium: Left atrial size was normal in size. Right Atrium: Right atrial size was normal in size. Pericardium: There is no evidence of pericardial effusion. Mitral Valve: The mitral valve is normal in structure. No evidence of mitral valve regurgitation. No evidence of mitral valve stenosis. Tricuspid Valve: The tricuspid valve is normal in structure. Tricuspid valve regurgitation is trivial. No evidence of tricuspid stenosis. Aortic Valve: The aortic valve is tricuspid. There is mild calcification of the aortic valve. There is mild  thickening of the aortic valve. Aortic valve regurgitation is not visualized. Aortic valve sclerosis is present, with no evidence of aortic valve stenosis. Aortic valve mean gradient measures 4.0 mmHg. Aortic valve peak gradient measures 9.2 mmHg. Aortic valve area, by VTI measures 2.89 cm. Pulmonic Valve: The pulmonic valve was normal in structure. Pulmonic valve regurgitation is not visualized. No evidence of pulmonic stenosis. Aorta: The aortic root is normal in size and structure. Venous: The inferior vena cava is normal in size with greater than 50% respiratory variability, suggesting right atrial pressure of 3 mmHg. IAS/Shunts: The interatrial septum was not well visualized. Additional Comments: 3D was performed not requiring image post processing on an independent workstation and was indeterminate.  LEFT VENTRICLE PLAX 2D LVIDd:         3.80 cm     Diastology LVIDs:         2.20 cm     LV e' medial:    4.24 cm/s LV PW:         1.40 cm  LV E/e' medial:  17.2 LV IVS:        1.40 cm     LV e' lateral:   10.10 cm/s LVOT diam:     1.90 cm     LV E/e' lateral: 7.2 LV SV:         71 LV SV Index:   35 LVOT Area:     2.84 cm  LV Volumes (MOD) LV vol d, MOD A2C: 84.0 ml LV vol d, MOD A4C: 73.0 ml LV vol s, MOD A2C: 16.7 ml LV vol s, MOD A4C: 16.1 ml LV SV MOD A2C:     67.3 ml LV SV MOD A4C:     73.0 ml LV SV MOD BP:      64.9 ml RIGHT VENTRICLE RV S prime:     22.90 cm/s TAPSE (M-mode): 1.7 cm LEFT ATRIUM             Index LA Vol (A2C):   36.0 ml 17.86 ml/m LA Vol (A4C):   50.8 ml 25.20 ml/m LA Biplane Vol: 44.9 ml 22.27 ml/m  AORTIC VALVE AV Area (Vmax):    3.13 cm AV Area (Vmean):   3.13 cm AV Area (VTI):     2.89 cm AV Vmax:           152.00 cm/s AV Vmean:          94.300 cm/s AV VTI:            0.245 m AV Peak Grad:      9.2 mmHg AV Mean Grad:      4.0 mmHg LVOT Vmax:         168.00 cm/s LVOT Vmean:        104.000 cm/s LVOT VTI:          0.250 m LVOT/AV VTI ratio: 1.02  AORTA Ao Asc diam: 3.60 cm MITRAL  VALVE               TRICUSPID VALVE MV Area (PHT): 2.99 cm    TR Peak grad:   24.4 mmHg MV Decel Time: 254 msec    TR Vmax:        247.00 cm/s MV E velocity: 73.10 cm/s MV A velocity: 87.90 cm/s  SHUNTS MV E/A ratio:  0.83        Systemic VTI:  0.25 m                            Systemic Diam: 1.90 cm Janelle Mediate MD Electronically signed by Janelle Mediate MD Signature Date/Time: 02/03/2024/4:36:14 PM    Final    DG Chest Portable 1 View Result Date: 02/02/2024 CLINICAL DATA:  Syncope. EXAM: PORTABLE CHEST 1 VIEW COMPARISON:  December 06, 2023. FINDINGS: The heart size and mediastinal contours are within normal limits. Right internal jugular Port-A-Cath is noted in grossly good position. Both lungs are clear. The visualized skeletal structures are unremarkable. IMPRESSION: No active disease. Electronically Signed   By: Rosalene Colon M.D.   On: 02/02/2024 14:33     The results of significant diagnostics from this hospitalization (including imaging, microbiology, ancillary and laboratory) are listed below for reference.     Microbiology: Recent Results (from the past 240 hours)  Blood Culture (routine x 2)     Status: None (Preliminary result)   Collection Time: 02/17/24  5:20 PM   Specimen: BLOOD RIGHT FOREARM  Result Value Ref Range Status   Specimen Description  Final    BLOOD RIGHT FOREARM Performed at [facility], [address], [place], [place] [address]    Special Requests   Final    BOTTLES DRAWN AEROBIC AND ANAEROBIC Blood Culture adequate volume Performed at [facility], [address], [place], [place] [address]    Culture   Final    NO GROWTH 4 DAYS Performed at [facility], [address] [place], [place] [address]    Report Status PENDING  Incomplete  Blood Culture (routine x 2)     Status: Abnormal   Collection Time: 02/17/24  5:25 PM   Specimen: Left Antecubital; Blood  Result Value Ref Range Status   Specimen Description    Final    LEFT ANTECUBITAL Performed at [facility], [address] [place], [place] [address]    Special Requests   Final    BOTTLES DRAWN AEROBIC AND ANAEROBIC Blood Culture results may not be optimal due to an inadequate volume of blood received in culture bottles Performed at [facility], [address], [place], [place] [address]    Culture  Setup Time   Final    GRAM NEGATIVE RODS ANAEROBIC BOTTLE ONLY CRITICAL RESULT CALLED TO, READ BACK BY AND VERIFIED WITH: [facility] CHRISTINE SHADE [phone] AT 1629 BY EC Performed at [facility], [address], [place], [place] [address]    Culture PSEUDOMONAS AERUGINOSA (A)  Final   Report Status 02/20/2024 FINAL  Final   Organism ID, Bacteria PSEUDOMONAS AERUGINOSA  Final      Susceptibility   Pseudomonas aeruginosa - MIC*    CEFTAZIDIME 4 SENSITIVE Sensitive     CIPROFLOXACIN  <=0.25 SENSITIVE Sensitive     GENTAMICIN  2 SENSITIVE Sensitive     IMIPENEM 1 SENSITIVE Sensitive     PIP/TAZO 8 SENSITIVE Sensitive ug/mL    CEFEPIME 2 SENSITIVE Sensitive     * PSEUDOMONAS AERUGINOSA  Blood Culture ID Panel (Reflexed)     Status: Abnormal   Collection Time: 02/17/24  5:25 PM  Result Value Ref Range Status   Enterococcus faecalis NOT DETECTED NOT DETECTED Final   Enterococcus Faecium NOT DETECTED NOT DETECTED Final   Listeria monocytogenes NOT DETECTED NOT DETECTED Final   Staphylococcus species NOT DETECTED NOT DETECTED Final   Staphylococcus aureus (BCID) NOT DETECTED NOT DETECTED Final   Staphylococcus epidermidis NOT DETECTED NOT DETECTED Final   Staphylococcus lugdunensis NOT DETECTED NOT DETECTED Final   Streptococcus species NOT DETECTED NOT DETECTED Final   Streptococcus agalactiae NOT DETECTED NOT DETECTED Final   Streptococcus pneumoniae NOT DETECTED NOT DETECTED Final   Streptococcus pyogenes NOT DETECTED NOT DETECTED Final   A.calcoaceticus-baumannii NOT DETECTED NOT DETECTED Final    Bacteroides fragilis NOT DETECTED NOT DETECTED Final   Enterobacterales NOT DETECTED NOT DETECTED Final   Enterobacter cloacae complex NOT DETECTED NOT DETECTED Final   Escherichia coli NOT DETECTED NOT DETECTED Final   Klebsiella aerogenes NOT DETECTED NOT DETECTED Final   Klebsiella oxytoca NOT DETECTED NOT DETECTED Final   Klebsiella pneumoniae NOT DETECTED NOT DETECTED Final   Proteus species NOT DETECTED NOT DETECTED Final   Salmonella species NOT DETECTED NOT DETECTED Final   Serratia marcescens NOT DETECTED NOT DETECTED Final   Haemophilus influenzae NOT DETECTED NOT DETECTED Final   Neisseria meningitidis NOT DETECTED NOT DETECTED Final   Pseudomonas aeruginosa DETECTED (A) NOT DETECTED Final    Comment: CRITICAL RESULT CALLED TO, READ BACK BY AND VERIFIED  WITH: [facility] CHRISTINE SHADE [phone] AT 1629 BY EC    Stenotrophomonas maltophilia NOT DETECTED NOT DETECTED Final   Candida albicans NOT DETECTED NOT DETECTED Final   Candida auris NOT DETECTED NOT DETECTED Final   Candida glabrata NOT DETECTED NOT DETECTED Final   Candida krusei NOT DETECTED NOT DETECTED Final   Candida parapsilosis NOT DETECTED NOT DETECTED Final   Candida tropicalis NOT DETECTED NOT DETECTED Final   Cryptococcus neoformans/gattii NOT DETECTED NOT DETECTED Final   CTX-M ESBL NOT DETECTED NOT DETECTED Final   Carbapenem resistance IMP NOT DETECTED NOT DETECTED Final   Carbapenem resistance KPC NOT DETECTED NOT DETECTED Final   Carbapenem resistance NDM NOT DETECTED NOT DETECTED Final   Carbapenem resistance VIM NOT DETECTED NOT DETECTED Final    Comment: Performed at [facility], [address], [place], [place] [address]  Urine Culture     Status: Abnormal   Collection Time: 02/17/24  6:24 PM   Specimen: Urine, Random  Result Value Ref Range Status   Specimen Description   Final    URINE, RANDOM Performed at [facility], [address], [place], [place] [address]     Special Requests   Final    NONE Reflexed from [phone] Performed at [facility], [address], [place], [place] [address]    Culture >=100,000 COLONIES/mL PSEUDOMONAS AERUGINOSA (A)  Final   Report Status 02/19/2024 FINAL  Final   Organism ID, Bacteria PSEUDOMONAS AERUGINOSA (A)  Final      Susceptibility   Pseudomonas aeruginosa - MIC*    CEFTAZIDIME 4 SENSITIVE Sensitive     CIPROFLOXACIN  <=0.25 SENSITIVE Sensitive     GENTAMICIN  <=1 SENSITIVE Sensitive     IMIPENEM 1 SENSITIVE Sensitive     PIP/TAZO 8 SENSITIVE Sensitive ug/mL    CEFEPIME 2 SENSITIVE Sensitive     * >=100,000 COLONIES/mL PSEUDOMONAS AERUGINOSA  Resp panel by RT-PCR (RSV, Flu A&B, Covid) Anterior Nasal Swab     Status: None   Collection Time: 02/18/24  9:42 AM   Specimen: Anterior Nasal Swab  Result Value Ref Range Status   SARS Coronavirus 2 by RT PCR NEGATIVE NEGATIVE Final    Comment: (NOTE) SARS-CoV-2 target nucleic acids are NOT DETECTED.  The SARS-CoV-2 RNA is generally detectable in upper respiratory specimens during the acute phase of infection. The lowest concentration of SARS-CoV-2 viral copies this assay can detect is 138 copies/mL. A negative result does not preclude SARS-Cov-2 infection and should not be used as the sole basis for treatment or other patient management decisions. A negative result may occur with  improper specimen collection/handling, submission of specimen other than nasopharyngeal swab, presence of viral mutation(s) within the areas targeted by this assay, and inadequate number of viral copies(<138 copies/mL). A negative result must be combined with clinical observations, patient history, and epidemiological information. The expected result is Negative.  Fact Sheet for Patients:  BloggerCourse.com  Fact Sheet for Healthcare Providers:  SeriousBroker.it  This test is no t yet approved or cleared by the  [place]  FDA and  has been authorized for detection and/or diagnosis of SARS-CoV-2 by FDA under an Emergency Use Authorization (EUA). This EUA will remain  in effect (meaning this test can be used) for the duration of the COVID-19 declaration under Section 564(b)(1) of the Act, 21 U.S.C.section 360bbb-3(b)(1), unless the authorization is terminated  or revoked sooner.       Influenza A by PCR NEGATIVE NEGATIVE Final  Influenza B by PCR NEGATIVE NEGATIVE Final    Comment: (NOTE) The Xpert Xpress SARS-CoV-2/FLU/RSV plus assay is intended as an aid in the diagnosis of influenza from Nasopharyngeal swab specimens and should not be used as a sole basis for treatment. Nasal washings and aspirates are unacceptable for Xpert Xpress SARS-CoV-2/FLU/RSV testing.  Fact Sheet for Patients: BloggerCourse.com  Fact Sheet for Healthcare Providers: SeriousBroker.it  This test is not yet approved or cleared by the [place]  FDA and has been authorized for detection and/or diagnosis of SARS-CoV-2 by FDA under an Emergency Use Authorization (EUA). This EUA will remain in effect (meaning this test can be used) for the duration of the COVID-19 declaration under Section 564(b)(1) of the Act, 21 U.S.C. section 360bbb-3(b)(1), unless the authorization is terminated or revoked.     Resp Syncytial Virus by PCR NEGATIVE NEGATIVE Final    Comment: (NOTE) Fact Sheet for Patients: BloggerCourse.com  Fact Sheet for Healthcare Providers: SeriousBroker.it  This test is not yet approved or cleared by the [place]  FDA and has been authorized for detection and/or diagnosis of SARS-CoV-2 by FDA under an Emergency Use Authorization (EUA). This EUA will remain in effect (meaning this test can be used) for the duration of the COVID-19 declaration under Section 564(b)(1) of the Act, 21 U.S.C. section  360bbb-3(b)(1), unless the authorization is terminated or revoked.  Performed at [facility], [address] [place], [place] [address]   Urine Culture     Status: Abnormal   Collection Time: 02/18/24  9:54 AM   Specimen: Urine, Random  Result Value Ref Range Status   Specimen Description   Final    URINE, RANDOM Performed at [facility], [address], [place], [place] [address]    Special Requests   Final    NONE Reflexed from [phone] Performed at [facility], [address], [place], [place] [address]    Culture >=100,000 COLONIES/mL PSEUDOMONAS AERUGINOSA (A)  Final   Report Status 02/20/2024 FINAL  Final   Organism ID, Bacteria PSEUDOMONAS AERUGINOSA (A)  Final      Susceptibility   Pseudomonas aeruginosa - MIC*    CEFTAZIDIME 4 SENSITIVE Sensitive     CIPROFLOXACIN  0.5 SENSITIVE Sensitive     GENTAMICIN  2 SENSITIVE Sensitive     IMIPENEM 1 SENSITIVE Sensitive     PIP/TAZO 8 SENSITIVE Sensitive ug/mL    CEFEPIME 2 SENSITIVE Sensitive     * >=100,000 COLONIES/mL PSEUDOMONAS AERUGINOSA  Culture, blood (Routine X 2) w Reflex to ID Panel     Status: None (Preliminary result)   Collection Time: 02/19/24  5:45 AM   Specimen: BLOOD RIGHT ARM  Result Value Ref Range Status   Specimen Description   Final    BLOOD RIGHT ARM Performed at [facility], [address] [place], [place] [address]    Special Requests   Final    BOTTLES DRAWN AEROBIC AND ANAEROBIC Blood Culture adequate volume Performed at [facility], [address] [place], [place] [address]    Culture   Final    NO GROWTH 2 DAYS Performed at [facility], [address], [place], [place] [address]    Report Status PENDING  Incomplete  Culture, blood (Routine X 2) w Reflex to ID Panel     Status: None (Preliminary result)   Collection Time: 02/19/24  5:45 AM   Specimen: BLOOD RIGHT ARM  Result Value Ref Range Status  Specimen Description   Final    BLOOD RIGHT ARM Performed at [facility], [address] [place], [place] [address]    Special Requests   Final    BOTTLES DRAWN AEROBIC AND ANAEROBIC Blood Culture results may not be optimal due to an inadequate volume of blood received in culture bottles Performed at [facility], [address], [name], [place] [address]    Culture   Final    NO GROWTH 2 DAYS Performed at [facility], [address], [place], [place] [address]    Report Status PENDING  Incomplete     Labs: BNP (last 3 results) Recent Labs    02/09/24 2145  BNP 224.8*   Basic Metabolic Panel: Recent Labs  Lab 02/17/24 1725 02/18/24 1036 02/19/24 0545 02/20/24 0501 02/21/24 0427  NA 133* 130* 132* 131* 130*  K 4.1 4.2 3.7 3.3* 3.3*  CL 97* 97* 99 99 99  CO2 27 27 26 25 25   GLUCOSE 154* 172* 115* 97 99  BUN 12 18 18 15 13   CREATININE 1.20 1.44* 1.31* 1.03 0.85  CALCIUM  8.1* 7.6* 7.9* 7.7* 7.6*  MG  --  2.2  --  2.2 2.2   Liver Function Tests: Recent Labs  Lab 02/17/24 1725 02/18/24 1036  AST 21 16  ALT 10 10  ALKPHOS 73 66  BILITOT 0.3 1.0  PROT 5.2* 4.7*  ALBUMIN  2.5* 2.3*   No results for input(s): "LIPASE", "AMYLASE" in the last 168 hours. No results for input(s): "AMMONIA" in the last 168 hours. CBC: Recent Labs  Lab 02/17/24 1725 02/18/24 1036 02/19/24 0545 02/20/24 0501 02/21/24 0427  WBC 16.7* 18.1* 9.8 7.0 5.9  NEUTROABS 14.3* 16.0*  --   --   --   HGB 9.5* 8.8* 8.8* 8.5* 8.5*  HCT 29.3* 27.3* 27.4* 25.9* 26.4*  MCV 100.3* 104.2* 101.1* 100.4* 101.1*  PLT 253 182 181 160 179   Cardiac Enzymes: No results for input(s): "CKTOTAL", "CKMB", "CKMBINDEX", "TROPONINI" in the last 168 hours. BNP: Invalid input(s): "POCBNP" CBG: Recent Labs  Lab 02/18/24 0823  GLUCAP 145*   D-Dimer No results for input(s): "DDIMER" in the last 72 hours. Hgb A1c No results for input(s): "HGBA1C" in the last 72  hours. Lipid Profile No results for input(s): "CHOL", "HDL", "LDLCALC", "TRIG", "CHOLHDL", "LDLDIRECT" in the last 72 hours. Thyroid  function studies No results for input(s): "TSH", "T4TOTAL", "T3FREE", "THYROIDAB" in the last 72 hours.  Invalid input(s): "FREET3" Anemia work up No results for input(s): "VITAMINB12", "FOLATE", "FERRITIN", "TIBC", "IRON", "RETICCTPCT" in the last 72 hours. Urinalysis    Component Value Date/Time   COLORURINE YELLOW 02/18/2024 0954   APPEARANCEUR HAZY (A) 02/18/2024 0954   LABSPEC 1.015 02/18/2024 0954   PHURINE 5.0 02/18/2024 0954   GLUCOSEU NEGATIVE 02/18/2024 0954   GLUCOSEU NEGATIVE 04/28/2022 1157   HGBUR SMALL (A) 02/18/2024 0954   BILIRUBINUR NEGATIVE 02/18/2024 0954   BILIRUBINUR negative 04/28/2022 1130   BILIRUBINUR N 05/22/2018 0917   KETONESUR NEGATIVE 02/18/2024 0954   PROTEINUR 30 (A) 02/18/2024 0954   UROBILINOGEN 0.2 04/28/2022 1157   UROBILINOGEN 0.2 04/28/2022 1130   NITRITE NEGATIVE 02/18/2024 0954   LEUKOCYTESUR LARGE (A) 02/18/2024 0954   Sepsis Labs Recent Labs  Lab 02/18/24 1036 02/19/24 0545 02/20/24 0501 02/21/24 0427  WBC 18.1* 9.8 7.0 5.9   Microbiology Recent Results (from the past 240 hours)  Blood Culture (routine x 2)     Status: None (Preliminary result)  Collection Time: 02/17/24  5:20 PM   Specimen: BLOOD RIGHT FOREARM  Result Value Ref Range Status   Specimen Description   Final    BLOOD RIGHT FOREARM Performed at [facility], [address] [place], [place] [address]    Special Requests   Final    BOTTLES DRAWN AEROBIC AND ANAEROBIC Blood Culture adequate volume Performed at [facility], [address], [place], [place] [address]    Culture   Final    NO GROWTH 4 DAYS Performed at [facility], [address] [place], [place] [address]    Report Status PENDING  Incomplete  Blood Culture (routine x 2)     Status: Abnormal   Collection Time:  02/17/24  5:25 PM   Specimen: Left Antecubital; Blood  Result Value Ref Range Status   Specimen Description   Final    LEFT ANTECUBITAL Performed at [facility], [address], [place], [place] [address]    Special Requests   Final    BOTTLES DRAWN AEROBIC AND ANAEROBIC Blood Culture results may not be optimal due to an inadequate volume of blood received in culture bottles Performed at [facility], [address] [place], [place] [address]    Culture  Setup Time   Final    GRAM NEGATIVE RODS ANAEROBIC BOTTLE ONLY CRITICAL RESULT CALLED TO, READ BACK BY AND VERIFIED WITH: [facility] CHRISTINE SHADE [phone] AT 1629 BY EC Performed at [facility], [address] [place], [place] [address]    Culture PSEUDOMONAS AERUGINOSA (A)  Final   Report Status 02/20/2024 FINAL  Final   Organism ID, Bacteria PSEUDOMONAS AERUGINOSA  Final      Susceptibility   Pseudomonas aeruginosa - MIC*    CEFTAZIDIME 4 SENSITIVE Sensitive     CIPROFLOXACIN  <=0.25 SENSITIVE Sensitive     GENTAMICIN  2 SENSITIVE Sensitive     IMIPENEM 1 SENSITIVE Sensitive     PIP/TAZO 8 SENSITIVE Sensitive ug/mL    CEFEPIME 2 SENSITIVE Sensitive     * PSEUDOMONAS AERUGINOSA  Blood Culture ID Panel (Reflexed)     Status: Abnormal   Collection Time: 02/17/24  5:25 PM  Result Value Ref Range Status   Enterococcus faecalis NOT DETECTED NOT DETECTED Final   Enterococcus Faecium NOT DETECTED NOT DETECTED Final   Listeria monocytogenes NOT DETECTED NOT DETECTED Final   Staphylococcus species NOT DETECTED NOT DETECTED Final   Staphylococcus aureus (BCID) NOT DETECTED NOT DETECTED Final   Staphylococcus epidermidis NOT DETECTED NOT DETECTED Final   Staphylococcus lugdunensis NOT DETECTED NOT DETECTED Final   Streptococcus species NOT DETECTED NOT DETECTED Final   Streptococcus agalactiae NOT DETECTED NOT DETECTED Final   Streptococcus pneumoniae NOT DETECTED NOT DETECTED Final    Streptococcus pyogenes NOT DETECTED NOT DETECTED Final   A.calcoaceticus-baumannii NOT DETECTED NOT DETECTED Final   Bacteroides fragilis NOT DETECTED NOT DETECTED Final   Enterobacterales NOT DETECTED NOT DETECTED Final   Enterobacter cloacae complex NOT DETECTED NOT DETECTED Final   Escherichia coli NOT DETECTED NOT DETECTED Final   Klebsiella aerogenes NOT DETECTED NOT DETECTED Final   Klebsiella oxytoca NOT DETECTED NOT DETECTED Final   Klebsiella pneumoniae NOT DETECTED NOT DETECTED Final   Proteus species NOT DETECTED NOT DETECTED Final   Salmonella species NOT DETECTED NOT DETECTED Final   Serratia marcescens NOT DETECTED NOT DETECTED Final   Haemophilus influenzae NOT DETECTED NOT DETECTED Final   Neisseria meningitidis NOT DETECTED NOT  DETECTED Final   Pseudomonas aeruginosa DETECTED (A) NOT DETECTED Final    Comment: CRITICAL RESULT CALLED TO, READ BACK BY AND VERIFIED WITH: PHAR[facility] CHRISTINE SHADE [phone] AT 1629 BY EC    Stenotrophomonas maltophilia NOT DETECTED NOT DETECTED Final   Candida albicans NOT DETECTED NOT DETECTED Final   Candida auris NOT DETECTED NOT DETECTED Final   Candida glabrata NOT DETECTED NOT DETECTED Final   Candida krusei NOT DETECTED NOT DETECTED Final   Candida parapsilosis NOT DETECTED NOT DETECTED Final   Candida tropicalis NOT DETECTED NOT DETECTED Final   Cryptococcus neoformans/gattii NOT DETECTED NOT DETECTED Final   CTX-M ESBL NOT DETECTED NOT DETECTED Final   Carbapenem resistance IMP NOT DETECTED NOT DETECTED Final   Carbapenem resistance KPC NOT DETECTED NOT DETECTED Final   Carbapenem resistance NDM NOT DETECTED NOT DETECTED Final   Carbapenem resistance VIM NOT DETECTED NOT DETECTED Final    Comment: Performed at [facility], [address] [place], [place] [address]  Urine Culture     Status: Abnormal   Collection Time: 02/17/24  6:24 PM   Specimen: Urine, Random  Result Value Ref Range Status   Specimen Description   Final     URINE, RANDOM Performed at [facility], [address], [place], [place] [address]    Special Requests   Final    NONE Reflexed from [id number] Performed at [facility], [address] [place], [place] [address]    Culture >=100,000 COLONIES/mL PSEUDOMONAS AERUGINOSA (A)  Final   Report Status 02/19/2024 FINAL  Final   Organism ID, Bacteria PSEUDOMONAS AERUGINOSA (A)  Final      Susceptibility   Pseudomonas aeruginosa - MIC*    CEFTAZIDIME 4 SENSITIVE Sensitive     CIPROFLOXACIN  <=0.25 SENSITIVE Sensitive     GENTAMICIN  <=1 SENSITIVE Sensitive     IMIPENEM 1 SENSITIVE Sensitive     PIP/TAZO 8 SENSITIVE Sensitive ug/mL    CEFEPIME 2 SENSITIVE Sensitive     * >=100,000 COLONIES/mL PSEUDOMONAS AERUGINOSA  Resp panel by RT-PCR (RSV, Flu A&B, Covid) Anterior Nasal Swab     Status: None   Collection Time: 02/18/24  9:42 AM   Specimen: Anterior Nasal Swab  Result Value Ref Range Status   SARS Coronavirus 2 by RT PCR NEGATIVE NEGATIVE Final    Comment: (NOTE) SARS-CoV-2 target nucleic acids are NOT DETECTED.  The SARS-CoV-2 RNA is generally detectable in upper respiratory specimens during the acute phase of infection. The lowest concentration of SARS-CoV-2 viral copies this assay can detect is 138 copies/mL. A negative result does not preclude SARS-Cov-2 infection and should not be used as the sole basis for treatment or other patient management decisions. A negative result may occur with  improper specimen collection/handling, submission of specimen other than nasopharyngeal swab, presence of viral mutation(s) within the areas targeted by this assay, and inadequate number of viral copies(<138 copies/mL). A negative result must be combined with clinical observations, patient history, and epidemiological information. The expected result is Negative.  Fact Sheet for Patients:  BloggerCourse.com  Fact Sheet for  Healthcare Providers:  SeriousBroker.it  This test is no t yet approved or cleared by the [place]  FDA and  has been authorized for detection and/or diagnosis of SARS-CoV-2 by FDA under an Emergency Use Authorization (EUA). This EUA will remain  in effect (meaning this test can be used) for the duration of the COVID-19 declaration under Section 564(b)(1) of the Act, 21 U.S.C.section 360bbb-3(b)(1),  unless the authorization is terminated  or revoked sooner.       Influenza A by PCR NEGATIVE NEGATIVE Final   Influenza B by PCR NEGATIVE NEGATIVE Final    Comment: (NOTE) The Xpert Xpress SARS-CoV-2/FLU/RSV plus assay is intended as an aid in the diagnosis of influenza from Nasopharyngeal swab specimens and should not be used as a sole basis for treatment. Nasal washings and aspirates are unacceptable for Xpert Xpress SARS-CoV-2/FLU/RSV testing.  Fact Sheet for Patients: BloggerCourse.com  Fact Sheet for Healthcare Providers: SeriousBroker.it  This test is not yet approved or cleared by the [place]  FDA and has been authorized for detection and/or diagnosis of SARS-CoV-2 by FDA under an Emergency Use Authorization (EUA). This EUA will remain in effect (meaning this test can be used) for the duration of the COVID-19 declaration under Section 564(b)(1) of the Act, 21 U.S.C. section 360bbb-3(b)(1), unless the authorization is terminated or revoked.     Resp Syncytial Virus by PCR NEGATIVE NEGATIVE Final    Comment: (NOTE) Fact Sheet for Patients: BloggerCourse.com  Fact Sheet for Healthcare Providers: SeriousBroker.it  This test is not yet approved or cleared by the [place]  FDA and has been authorized for detection and/or diagnosis of SARS-CoV-2 by FDA under an Emergency Use Authorization (EUA). This EUA will remain in effect (meaning this  test can be used) for the duration of the COVID-19 declaration under Section 564(b)(1) of the Act, 21 U.S.C. section 360bbb-3(b)(1), unless the authorization is terminated or revoked.  Performed at [facility], [address], [place], [place] [address]   Urine Culture     Status: Abnormal   Collection Time: 02/18/24  9:54 AM   Specimen: Urine, Random  Result Value Ref Range Status   Specimen Description   Final    URINE, RANDOM Performed at [facility], [address], [place], [place] [address]    Special Requests   Final    NONE Reflexed from [id number] Performed at [facility], [address], [place], [place] [address]    Culture >=100,000 COLONIES/mL PSEUDOMONAS AERUGINOSA (A)  Final   Report Status 02/20/2024 FINAL  Final   Organism ID, Bacteria PSEUDOMONAS AERUGINOSA (A)  Final      Susceptibility   Pseudomonas aeruginosa - MIC*    CEFTAZIDIME 4 SENSITIVE Sensitive     CIPROFLOXACIN  0.5 SENSITIVE Sensitive     GENTAMICIN  2 SENSITIVE Sensitive     IMIPENEM 1 SENSITIVE Sensitive     PIP/TAZO 8 SENSITIVE Sensitive ug/mL    CEFEPIME 2 SENSITIVE Sensitive     * >=100,000 COLONIES/mL PSEUDOMONAS AERUGINOSA  Culture, blood (Routine X 2) w Reflex to ID Panel     Status: None (Preliminary result)   Collection Time: 02/19/24  5:45 AM   Specimen: BLOOD RIGHT ARM  Result Value Ref Range Status   Specimen Description   Final    BLOOD RIGHT ARM Performed at [facility], [address] [place], [place] [address]    Special Requests   Final    BOTTLES DRAWN AEROBIC AND ANAEROBIC Blood Culture adequate volume Performed at [facility], [address] [place], [place] [address]    Culture   Final    NO GROWTH 2 DAYS Performed at [facility], [address], [place], [place] [address]    Report Status PENDING  Incomplete  Culture, blood (Routine X 2) w Reflex to ID Panel     Status: None  (  Preliminary result)   Collection Time: 02/19/24  5:45 AM   Specimen: BLOOD RIGHT ARM  Result Value Ref Range Status   Specimen Description   Final    BLOOD RIGHT ARM Performed at [facility], [address], [place], [place] [address]    Special Requests   Final    BOTTLES DRAWN AEROBIC AND ANAEROBIC Blood Culture results may not be optimal due to an inadequate volume of blood received in culture bottles Performed at [facility], [address], [place], [place] [address]    Culture   Final    NO GROWTH 2 DAYS Performed at [facility], [address], [place], [place] [address]    Report Status PENDING  Incomplete     Time coordinating discharge:  I have spent 35 minutes face to face with the patient and on the ward discussing the patients care, assessment, plan and disposition with other care givers. >50% of the time was devoted counseling the patient about the risks and benefits of treatment/Discharge disposition and coordinating care.   SIGNED:   Maggie Schooner, MD  [facility] 02/21/2024, 11:53 AM   If 7PM-7AM, please contact night-coverage

## 2024-02-22 ENCOUNTER — Other Ambulatory Visit

## 2024-02-22 DIAGNOSIS — N3000 Acute cystitis without hematuria: Secondary | ICD-10-CM | POA: Diagnosis not present

## 2024-02-22 DIAGNOSIS — R338 Other retention of urine: Secondary | ICD-10-CM | POA: Diagnosis not present

## 2024-02-22 LAB — CULTURE, BLOOD (ROUTINE X 2)
Culture: NO GROWTH
Special Requests: ADEQUATE

## 2024-02-23 ENCOUNTER — Ambulatory Visit (INDEPENDENT_AMBULATORY_CARE_PROVIDER_SITE_OTHER): Admitting: Nurse Practitioner

## 2024-02-23 VITALS — BP 118/60 | HR 64 | Temp 97.8°F | Ht 71.0 in | Wt 202.2 lb

## 2024-02-23 DIAGNOSIS — Z125 Encounter for screening for malignant neoplasm of prostate: Secondary | ICD-10-CM | POA: Diagnosis not present

## 2024-02-23 DIAGNOSIS — E782 Mixed hyperlipidemia: Secondary | ICD-10-CM | POA: Diagnosis not present

## 2024-02-23 DIAGNOSIS — R7989 Other specified abnormal findings of blood chemistry: Secondary | ICD-10-CM | POA: Insufficient documentation

## 2024-02-23 DIAGNOSIS — I10 Essential (primary) hypertension: Secondary | ICD-10-CM | POA: Diagnosis not present

## 2024-02-23 DIAGNOSIS — Z09 Encounter for follow-up examination after completed treatment for conditions other than malignant neoplasm: Secondary | ICD-10-CM

## 2024-02-23 LAB — CBC
HCT: 30.9 % — ABNORMAL LOW (ref 39.0–52.0)
Hemoglobin: 10.3 g/dL — ABNORMAL LOW (ref 13.0–17.0)
MCHC: 33.4 g/dL (ref 30.0–36.0)
MCV: 97.4 fl (ref 78.0–100.0)
Platelets: 233 10*3/uL (ref 150.0–400.0)
RBC: 3.17 Mil/uL — ABNORMAL LOW (ref 4.22–5.81)
RDW: 18.5 % — ABNORMAL HIGH (ref 11.5–15.5)
WBC: 6.4 10*3/uL (ref 4.0–10.5)

## 2024-02-23 LAB — LIPID PANEL
Cholesterol: 159 mg/dL (ref 0–200)
HDL: 37.4 mg/dL — ABNORMAL LOW (ref >39.00)
LDL Cholesterol: 90 mg/dL (ref 0–99)
NonHDL: 121.16
Total CHOL/HDL Ratio: 4
Triglycerides: 155 mg/dL — ABNORMAL HIGH (ref 0.0–149.0)
VLDL: 31 mg/dL (ref 0.0–40.0)

## 2024-02-23 LAB — IBC + FERRITIN
Ferritin: 784.7 ng/mL — ABNORMAL HIGH (ref 22.0–322.0)
Iron: 83 ug/dL (ref 42–165)
Saturation Ratios: 31.9 % (ref 20.0–50.0)
TIBC: 260.4 ug/dL (ref 250.0–450.0)
Transferrin: 186 mg/dL — ABNORMAL LOW (ref 212.0–360.0)

## 2024-02-23 LAB — COMPREHENSIVE METABOLIC PANEL WITH GFR
ALT: 23 U/L (ref 0–53)
AST: 37 U/L (ref 0–37)
Albumin: 3.2 g/dL — ABNORMAL LOW (ref 3.5–5.2)
Alkaline Phosphatase: 71 U/L (ref 39–117)
BUN: 8 mg/dL (ref 6–23)
CO2: 32 meq/L (ref 19–32)
Calcium: 8.6 mg/dL (ref 8.4–10.5)
Chloride: 100 meq/L (ref 96–112)
Creatinine, Ser: 1.15 mg/dL (ref 0.40–1.50)
GFR: 64.28 mL/min (ref >60.00)
Glucose, Bld: 111 mg/dL — ABNORMAL HIGH (ref 70–99)
Potassium: 4.5 meq/L (ref 3.5–5.1)
Sodium: 137 meq/L (ref 135–145)
Total Bilirubin: 0.4 mg/dL (ref 0.2–1.2)
Total Protein: 5.2 g/dL — ABNORMAL LOW (ref 6.0–8.3)

## 2024-02-23 LAB — TSH: TSH: 2 u[IU]/mL (ref 0.35–5.50)

## 2024-02-23 LAB — VITAMIN B12: Vitamin B-12: 846 pg/mL (ref 211–911)

## 2024-02-23 LAB — PSA, MEDICARE: PSA: 0 ng/mL — ABNORMAL LOW (ref 0.10–4.00)

## 2024-02-23 LAB — FOLATE: Folate: 7.2 ng/mL (ref >5.9)

## 2024-02-23 NOTE — Patient Instructions (Signed)
Nice to see you today I will be in touch with the labs once I have them Follow up with me as scheduled

## 2024-02-23 NOTE — Progress Notes (Signed)
 Acute Office Visit  Subjective:     Patient ID: Cameron [name], male    DOB: December 31, 1950, 73 y.o.   MRN: [id number]  Chief Complaint  Patient presents with   Hospitalization Follow-up    Pt complains of eating well again. States of having PT for [redacted] weeks along with home nurse. Catheter 4 times a day.     HPI Patient is in today for hospital follow up   Patient was seen in the emergency department on 02/18/2024.  He was seen in the ED the day before with a complaint of fever and fatigue.  His lab work was notable for leukocytosis.  He was started on Augmentin .  That morning he had worsening generalized weakness while in the bathroom had a syncopal episode he did have a prodrome of dizziness and lightheadedness.  He did have a bowel movement on the ground.  He presented to ED with weakness still.  Patient underwent work he did have some electrolyte derangement with a elevated creatinine and decreased GFR leukocytosis is still present and large leukocytes in his UA.  He was given fluids, Rocephin , Flagyl, vancomycin.  They do a CT head angio chest abdomen pelvis.  MRI of brain along with chest x-ray.  Patient was discharged on 02/21/2024 he was given continue Cipro  750 mg twice daily for 7 more days and he will In-N-Out self cath at least 3 times daily.  And to follow-up with PCP.  His urinary tract infection grew Pseudomonas.  Patient developed urinary retention on 516 and had a Foley catheter placed.  It was removed.    He is self cathing at home. He is able to do that. He has been eating well per his report. States that this morning he had a BM and was able to urinate and then cathed after and there was very little residual.  State that PT came to the house and did a eval. He will have 5 weeks worth of PT and skilled nursing     Review of Systems  Constitutional:  Negative for chills and fever.  Respiratory:  Negative for shortness of breath.   Cardiovascular:  Negative for chest pain.   Neurological:  Negative for dizziness and headaches.  Psychiatric/Behavioral:  Negative for hallucinations and suicidal ideas.         Objective:    BP 118/60   Pulse 64   Temp 97.8 F (36.6 C) (Oral)   Ht 5\' 11"  (1.803 m)   Wt 202 lb 3.2 oz (91.7 kg)   SpO2 99%   BMI 28.20 kg/m    Physical Exam Vitals and nursing note reviewed.  Constitutional:      Appearance: Normal appearance.  Cardiovascular:     Rate and Rhythm: Normal rate and regular rhythm.     Heart sounds: Normal heart sounds.  Pulmonary:     Effort: Pulmonary effort is normal.     Breath sounds: Normal breath sounds.  Neurological:     Mental Status: He is alert.     No results found for any visits on 02/23/24.      Assessment & Plan:   Problem List Items Addressed This Visit       Cardiovascular and Mediastinum   HTN (hypertension), benign   Relevant Orders   Lipid panel   TSH     Other   Hyperlipemia, mixed   Relevant Orders   Lipid panel   Hospital discharge follow-up - Primary   Reviewed ED note along  with inpatient discharge note imaging and most recent labs.      Abnormal CBC   Pending repeat CBC, iron studies, ferritin, folate, B12.      Relevant Orders   CBC   Comprehensive metabolic panel with GFR   IBC + Ferritin   Vitamin B12   Folate   Other Visit Diagnoses       Screening for prostate cancer       Relevant Orders   PSA, Medicare       No orders of the defined types were placed in this encounter.   Return if symptoms worsen or fail to improve, for As scheduled .  Margarie Shay, NP

## 2024-02-23 NOTE — Assessment & Plan Note (Signed)
 Pending repeat CBC, iron studies, ferritin, folate, B12.

## 2024-02-23 NOTE — Assessment & Plan Note (Signed)
 Reviewed ED note along with inpatient discharge note imaging and most recent labs.

## 2024-02-24 ENCOUNTER — Telehealth: Payer: Self-pay | Admitting: *Deleted

## 2024-02-24 DIAGNOSIS — Z7951 Long term (current) use of inhaled steroids: Secondary | ICD-10-CM | POA: Diagnosis not present

## 2024-02-24 DIAGNOSIS — Z9181 History of falling: Secondary | ICD-10-CM | POA: Diagnosis not present

## 2024-02-24 DIAGNOSIS — Z792 Long term (current) use of antibiotics: Secondary | ICD-10-CM | POA: Diagnosis not present

## 2024-02-24 DIAGNOSIS — I10 Essential (primary) hypertension: Secondary | ICD-10-CM | POA: Diagnosis not present

## 2024-02-24 DIAGNOSIS — Z85118 Personal history of other malignant neoplasm of bronchus and lung: Secondary | ICD-10-CM | POA: Diagnosis not present

## 2024-02-24 DIAGNOSIS — R339 Retention of urine, unspecified: Secondary | ICD-10-CM | POA: Diagnosis not present

## 2024-02-24 DIAGNOSIS — R296 Repeated falls: Secondary | ICD-10-CM | POA: Diagnosis not present

## 2024-02-24 DIAGNOSIS — B965 Pseudomonas (aeruginosa) (mallei) (pseudomallei) as the cause of diseases classified elsewhere: Secondary | ICD-10-CM | POA: Diagnosis not present

## 2024-02-24 DIAGNOSIS — N390 Urinary tract infection, site not specified: Secondary | ICD-10-CM | POA: Diagnosis not present

## 2024-02-24 DIAGNOSIS — Z466 Encounter for fitting and adjustment of urinary device: Secondary | ICD-10-CM | POA: Diagnosis not present

## 2024-02-24 DIAGNOSIS — R600 Localized edema: Secondary | ICD-10-CM | POA: Diagnosis not present

## 2024-02-24 LAB — CULTURE, BLOOD (ROUTINE X 2): Special Requests: ADEQUATE

## 2024-02-24 NOTE — Telephone Encounter (Signed)
 Copied from CRM [id number]. Topic: Clinical - Lab/Test Results >> Feb 24, 2024  3:43 PM Rosamond Comes wrote: Reason for CRM: patient wife Thersia Flax calling in, patient stated it's ok to speak with Thersia Flax. Patient and Thersia Flax would like to talk to nurse about all of the blood work [phone]

## 2024-02-27 ENCOUNTER — Ambulatory Visit: Payer: Self-pay | Admitting: Nurse Practitioner

## 2024-02-27 NOTE — Telephone Encounter (Signed)
 See lab result note.

## 2024-03-01 ENCOUNTER — Inpatient Hospital Stay: Admitting: Internal Medicine

## 2024-03-01 ENCOUNTER — Encounter: Payer: Self-pay | Admitting: Internal Medicine

## 2024-03-01 ENCOUNTER — Inpatient Hospital Stay

## 2024-03-01 ENCOUNTER — Inpatient Hospital Stay: Attending: Internal Medicine

## 2024-03-01 VITALS — BP 118/62 | HR 74 | Temp 97.6°F | Resp 16 | Ht 71.0 in | Wt 197.2 lb

## 2024-03-01 DIAGNOSIS — Z95828 Presence of other vascular implants and grafts: Secondary | ICD-10-CM

## 2024-03-01 DIAGNOSIS — C3412 Malignant neoplasm of upper lobe, left bronchus or lung: Secondary | ICD-10-CM | POA: Insufficient documentation

## 2024-03-01 DIAGNOSIS — D649 Anemia, unspecified: Secondary | ICD-10-CM | POA: Diagnosis not present

## 2024-03-01 DIAGNOSIS — C3490 Malignant neoplasm of unspecified part of unspecified bronchus or lung: Secondary | ICD-10-CM

## 2024-03-01 DIAGNOSIS — F17210 Nicotine dependence, cigarettes, uncomplicated: Secondary | ICD-10-CM | POA: Diagnosis not present

## 2024-03-01 DIAGNOSIS — Z902 Acquired absence of lung [part of]: Secondary | ICD-10-CM | POA: Insufficient documentation

## 2024-03-01 DIAGNOSIS — Z79899 Other long term (current) drug therapy: Secondary | ICD-10-CM | POA: Diagnosis not present

## 2024-03-01 LAB — CMP (CANCER CENTER ONLY)
ALT: 14 U/L (ref 0–44)
AST: 17 U/L (ref 15–41)
Albumin: 3.7 g/dL (ref 3.5–5.0)
Alkaline Phosphatase: 69 U/L (ref 38–126)
Anion gap: 4 — ABNORMAL LOW (ref 5–15)
BUN: 10 mg/dL (ref 8–23)
CO2: 32 mmol/L (ref 22–32)
Calcium: 8.9 mg/dL (ref 8.9–10.3)
Chloride: 102 mmol/L (ref 98–111)
Creatinine: 1.15 mg/dL (ref 0.61–1.24)
GFR, Estimated: >60 mL/min (ref >60)
Glucose, Bld: 162 mg/dL — ABNORMAL HIGH (ref 70–99)
Potassium: 4.5 mmol/L (ref 3.5–5.1)
Sodium: 138 mmol/L (ref 135–145)
Total Bilirubin: 0.5 mg/dL (ref 0.0–1.2)
Total Protein: 6.1 g/dL — ABNORMAL LOW (ref 6.5–8.1)

## 2024-03-01 LAB — CBC WITH DIFFERENTIAL (CANCER CENTER ONLY)
Abs Immature Granulocytes: 0.04 10*3/uL (ref 0.00–0.07)
Basophils Absolute: 0.1 10*3/uL (ref 0.0–0.1)
Basophils Relative: 1 %
Eosinophils Absolute: 0.1 10*3/uL (ref 0.0–0.5)
Eosinophils Relative: 1 %
HCT: 31.9 % — ABNORMAL LOW (ref 39.0–52.0)
Hemoglobin: 10.5 g/dL — ABNORMAL LOW (ref 13.0–17.0)
Immature Granulocytes: 1 %
Lymphocytes Relative: 22 %
Lymphs Abs: 1.3 10*3/uL (ref 0.7–4.0)
MCH: 32.8 pg (ref 26.0–34.0)
MCHC: 32.9 g/dL (ref 30.0–36.0)
MCV: 99.7 fL (ref 80.0–100.0)
Monocytes Absolute: 0.4 10*3/uL (ref 0.1–1.0)
Monocytes Relative: 8 %
Neutro Abs: 3.8 10*3/uL (ref 1.7–7.7)
Neutrophils Relative %: 67 %
Platelet Count: 281 10*3/uL (ref 150–400)
RBC: 3.2 MIL/uL — ABNORMAL LOW (ref 4.22–5.81)
RDW: 17.8 % — ABNORMAL HIGH (ref 11.5–15.5)
WBC Count: 5.6 10*3/uL (ref 4.0–10.5)
nRBC: 0 % (ref 0.0–0.2)

## 2024-03-01 LAB — MAGNESIUM: Magnesium: 2.2 mg/dL (ref 1.7–2.4)

## 2024-03-01 MED ORDER — HEPARIN SOD (PORK) LOCK FLUSH 100 UNIT/ML IV SOLN
500.0000 [IU] | Freq: Once | INTRAVENOUS | Status: AC
  Administered 2024-03-01: 500 [IU]

## 2024-03-01 MED ORDER — SODIUM CHLORIDE 0.9% FLUSH
10.0000 mL | Freq: Once | INTRAVENOUS | Status: AC
  Administered 2024-03-01: 10 mL

## 2024-03-01 NOTE — Progress Notes (Signed)
 Cameron [name] Telephone:([phone]) [phone]   Fax:(336) [phone]  OFFICE PROGRESS NOTE  Cameron Gaster, NP [address] [place] [address]  DIAGNOSIS: Stage IIA (T1c, N1, M0) Squamous cell Carcinoma diagnosed in January of 2025. Diagnosed with NSCLC, squamous cell carcinoma, stage IIB due to lymph node involvement and tumor size (2.9 cm).  PD-L1 TPS was 0%  PRIOR THERAPY:  1) Status post left upper lobectomy with lymph node dissection on October 03, 2023.  2) Systemic chemotherapy with cisplatin  75 Mg/M2 and docetaxel  75 Mg/M2 with Neulasta  support every 3 weeks.  First dose November 16, 2023.  Status post 4 cycles  CURRENT THERAPY: Observation.  INTERVAL HISTORY: Cameron [name] 73 y.o. male returns to the clinic today for follow-up visit accompanied by his wife.Discussed the use of AI scribe software for clinical note transcription with the patient, who gave verbal consent to proceed.  History of Present Illness   Cameron [name] is a 73 year old male with stage 2A non-small cell lung cancer who presents for follow-up after recent hospitalizations. He is accompanied by his wife.  He has a history of stage 2A non-small cell lung cancer, diagnosed in January 2025. He underwent a left upper lobectomy with lymph node dissection followed by four cycles of adjuvant systemic chemotherapy with cisplatin  and docetaxel . He is currently in the observation phase, with recent imaging studies showing no evidence of cancer recurrence or metastasis.  Recently, he experienced multiple hospitalizations due to complications. He was taken to the hospital by ambulance three times, with two instances occurring within hours of each other. During these episodes, he was unconscious five times and had a fever of 102.34F. He was initially sent home with a diagnosis of sepsis and suspected pneumonia according to the wife, but subsequent imaging studies showed no evidence of pneumonia or blood  clots.  He experienced significant leg swelling, which was noted when his weight increased from 197 to 204.9 pounds. A catheter was placed to drain excess fluid, and he was treated with Lasix and antibiotics due to a high white cell count. However, the resveratrol was discontinued without further explanation.  His recent lab results show improvement, with a white blood cell count within normal limits, anemia improving from 8.5 to 10.5, and normal magnesium  and potassium levels. However, his ferritin remains low, and his blood sugar is elevated at 162 mg/dL. He feels better than last month. No current symptoms of pneumonia or blood clots. He has experienced leg swelling and significant weight gain due to fluid retention.        MEDICAL HISTORY: Past Medical History:  Diagnosis Date   Adenomatous colon polyp 05/2009; 11/2014   2010:Tubular adenoma, no high grade dysplasia.Cameron [name]  + Hyperplastic polyps. 2016: Tubular adenoma-rpt 5 yrs.   Cancer (HCC)    TURP for prostate cancer   COPD (chronic obstructive pulmonary disease) (HCC)    Coronary artery disease    Difficult intubation    H/O chronic gastritis 05/2009   EGD: no h.pylori,dysplasia,or evidence of malignancy   Hyperlipidemia    lovaza from prior PMD-stopped due to cost   Hypertension    Obesity, Class I, BMI 30.0-34.9 (see actual BMI) 04/23/2014   Tobacco dependence    Ventral hernia    small    ALLERGIES:  is allergic to thorazine  [chlorpromazine ].  MEDICATIONS:  Current Outpatient Medications  Medication Sig Dispense Refill   atorvastatin  (LIPITOR) 20 MG tablet Take 1 tablet (20  mg total) by mouth daily. 90 tablet 1   fluticasone  (FLONASE ) 50 MCG/ACT nasal spray Place 1 spray into both nostrils 2 (two) times daily as needed for allergies or rhinitis.     fluticasone -salmeterol (ADVAIR  DISKUS) 250-50 MCG/ACT AEPB Inhale 1 puff into the lungs in the morning and at bedtime. 180 each 0   gabapentin  (NEURONTIN ) 300 MG capsule Take 1  capsule (300 mg total) by mouth 2 (two) times daily. (Patient taking differently: Take 300 mg by mouth in the morning and at bedtime.) 90 capsule 2   hydrochlorothiazide  (MICROZIDE ) 12.5 MG capsule Take 12.5 mg by mouth daily.     hydrocortisone  ointment 0.5 % Apply 1 Application topically 2 (two) times daily. 30 g 0   lidocaine -prilocaine  (EMLA ) cream Apply to the Port-A-Cath site 30-60 minutes before chemotherapy (Patient taking differently: Apply 1 Application topically as needed (for port access).) 30 g 0   magnesium  oxide (MAG-OX) 400 (240 Mg) MG tablet Take 1 tablet (400 mg total) by mouth 3 (three) times daily. 90 tablet 0   Misc. Devices (PRECISION CATHETER URINE SYS) KIT 1 Box by Does not apply route 3 (three) times daily. 1 kit 2   pantoprazole  (PROTONIX ) 40 MG tablet Take 1 tablet (40 mg total) by mouth daily at 12 noon. 30 tablet 1   potassium chloride  SA (KLOR-CON  M) 20 MEQ tablet Take 1 tablet (20 mEq total) by mouth daily. 30 tablet 0   valACYclovir  (VALTREX ) 1000 MG tablet Take 1 tablet (1,000 mg total) by mouth 2 (two) times daily for 3 days. Start within 72 hours of the first signs of a flare. (Patient taking differently: Take 500 mg by mouth daily.) 18 tablet 2   No current facility-administered medications for this visit.    SURGICAL HISTORY:  Past Surgical History:  Procedure Laterality Date   APPENDECTOMY  09/28/1971   BIOPSY  08/15/2023   Procedure: BIOPSY;  Surgeon: Prudy Brownie, DO;  Location: [facility];  Service: Pulmonary;;   BLADDER DIVERTICULECTOMY N/A 09/30/2022   Procedure: BLADDER DIVERTICULECTOMY;  Surgeon: Florencio Hunting, MD;  Location: [facility];  Service: Urology;  Laterality: N/A;   CARPAL TUNNEL RELEASE Left    CHOLECYSTECTOMY N/A 07/06/2018   Procedure: LAPAROSCOPIC CHOLECYSTECTOMY WITH INTRAOPERATIVE CHOLANGIOGRAM;  Surgeon: Juanita Norlander, MD;  Location: [facility] OR;  Service: General;  Laterality: N/A;   COLONOSCOPY  09/27/2008   [facility]   CYSTOSCOPY  N/A 09/30/2022   Procedure: Ardith Bedford;  Surgeon: Florencio Hunting, MD;  Location: [facility];  Service: Urology;  Laterality: N/A;   EYE SURGERY  2018   Cataracts removed only   FINGER SURGERY Left 12/2021   Left middle finger, laceration   HEMOSTASIS CONTROL  08/15/2023   Procedure: HEMOSTASIS CONTROL;  Surgeon: Prudy Brownie, DO;  Location: [facility];  Service: Pulmonary;;   INTERCOSTAL NERVE BLOCK Left 10/03/2023   Procedure: INTERCOSTAL NERVE BLOCK;  Surgeon: Zelphia Higashi, MD;  Location: [facility] OR;  Service: Thoracic;  Laterality: Left;   IR IMAGING GUIDED PORT INSERTION  12/08/2023   LYMPH NODE BIOPSY Left 10/03/2023   Procedure: LYMPH NODE BIOPSY;  Surgeon: Zelphia Higashi, MD;  Location: [facility] OR;  Service: Thoracic;  Laterality: Left;   ROBOT ASSISTED LAPAROSCOPIC RADICAL PROSTATECTOMY N/A 09/30/2022   Procedure: XI ROBOTIC ASSISTED LAPAROSCOPIC RADICAL PROSTATECTOMY LEVEL 3;  Surgeon: Florencio Hunting, MD;  Location: [facility];  Service: Urology;  Laterality: N/A;  270 MINUTES NEEDED FOR CASE   STRABISMUS SURGERY  age 7 or  9   TRANSURETHRAL RESECTION OF PROSTATE N/A 07/08/2022   Procedure: TRANSURETHRAL RESECTION OF THE PROSTATE (TURP)/ CYSTOSCOPY;  Surgeon: Florencio Hunting, MD;  Location: [facility];  Service: Urology;  Laterality: N/A;   VIDEO BRONCHOSCOPY N/A 08/15/2023   Procedure: VIDEO BRONCHOSCOPY WITHOUT FLUORO;  Surgeon: Prudy Brownie, DO;  Location: [facility];  Service: Pulmonary;  Laterality: N/A;    REVIEW OF SYSTEMS:  Constitutional: positive for fatigue Eyes: negative Ears, nose, mouth, throat, and face: negative Respiratory: negative Cardiovascular: negative Gastrointestinal: negative Genitourinary:negative Integument/breast: negative Hematologic/lymphatic: negative Musculoskeletal:negative Neurological: positive for weakness Behavioral/Psych: negative Endocrine: negative Allergic/Immunologic: negative   PHYSICAL EXAMINATION: General appearance:  alert, cooperative, fatigued, and no distress Head: Normocephalic, without obvious abnormality, atraumatic Neck: no adenopathy, no JVD, supple, symmetrical, trachea midline, and thyroid  not enlarged, symmetric, no tenderness/mass/nodules Lymph nodes: Cervical, supraclavicular, and axillary nodes normal. Resp: clear to auscultation bilaterally Back: symmetric, no curvature. ROM normal. No CVA tenderness. Cardio: regular rate and rhythm, S1, S2 normal, no murmur, click, rub or gallop GI: soft, non-tender; bowel sounds normal; no masses,  no organomegaly Extremities: extremities normal, atraumatic, no cyanosis or edema Neurologic: Alert and oriented X 3, normal strength and tone. Normal symmetric reflexes. Normal coordination and gait  ECOG PERFORMANCE STATUS: 1 - Symptomatic but completely ambulatory  Blood pressure 118/62, pulse 74, temperature 97.6 F (36.4 C), temperature source Oral, resp. rate 16, height 5\' 11"  (1.803 m), weight 197 lb 3.2 oz (89.4 kg), SpO2 100%.  LABORATORY DATA: Lab Results  Component Value Date   WBC 5.6 03/01/2024   HGB 10.5 (L) 03/01/2024   HCT 31.9 (L) 03/01/2024   MCV 99.7 03/01/2024   PLT 281 03/01/2024      Chemistry      Component Value Date/Time   NA 138 03/01/2024 1042   K 4.5 03/01/2024 1042   CL 102 03/01/2024 1042   CO2 32 03/01/2024 1042   BUN 10 03/01/2024 1042   CREATININE 1.15 03/01/2024 1042      Component Value Date/Time   CALCIUM  8.9 03/01/2024 1042   ALKPHOS 69 03/01/2024 1042   AST 17 03/01/2024 1042   ALT 14 03/01/2024 1042   BILITOT 0.5 03/01/2024 1042       RADIOGRAPHIC STUDIES: MR BRAIN WO CONTRAST Result Date: 02/18/2024 CLINICAL DATA:  Acute stroke suspected EXAM: MRI HEAD WITHOUT CONTRAST TECHNIQUE: Multiplanar, multiecho pulse sequences of the brain and surrounding structures were obtained without intravenous contrast. COMPARISON:  02/18/2024 head CT FINDINGS: Brain: No acute infarction, hemorrhage, hydrocephalus,  extra-axial collection or mass lesion. Small chronic infarcts in the bilateral occipital parietal cortex. Mild chronic white matter disease. Preserved brain volume for age. Vascular: Normal flow voids. Skull and upper cervical spine: Normal marrow signal. Sinuses/Orbits: Mild patchy opacification of frontal ethmoidal sinuses, chronic. Bilateral cataract resection. IMPRESSION: No acute or interval finding. Small chronic bilateral parietooccipital infarcts. Electronically Signed   By: Ronnette Coke M.D.   On: 02/18/2024 12:42   CT Angio Chest PE W and/or Wo Contrast Result Date: 02/18/2024 CLINICAL DATA:  Sepsis.  Syncope.  History of lung cancer. EXAM: CT ANGIOGRAPHY CHEST CT ABDOMEN AND PELVIS WITH CONTRAST TECHNIQUE: Multidetector CT imaging of the chest was performed using the standard protocol during bolus administration of intravenous contrast. Multiplanar CT image reconstructions and MIPs were obtained to evaluate the vascular anatomy. Multidetector CT imaging of the abdomen and pelvis was performed using the standard protocol during bolus administration of intravenous contrast. RADIATION DOSE REDUCTION: This exam was performed according to  the departmental dose-optimization program which includes automated exposure control, adjustment of the mA and/or kV according to patient size and/or use of iterative reconstruction technique. CONTRAST:  OMNIPAQUE  IOHEXOL  350 MG/ML SOLN COMPARISON:  PET-CT 08/02/2023. FINDINGS: CTA CHEST FINDINGS Cardiovascular: Satisfactory opacification of the pulmonary arteries to the segmental level. No evidence of pulmonary embolism. Normal heart size. No pericardial effusion. Aortic atherosclerosis and multi vessel coronary artery calcifications. Mediastinum/Nodes: No enlarged mediastinal, hilar, or axillary lymph nodes. Thyroid  gland, trachea, and esophagus demonstrate no significant findings. Lungs/Pleura: Status post left upper lobectomy. Small left pleural effusion.  Emphysema with diffuse bronchial wall thickening. No airspace consolidation, atelectasis or pneumothorax. Musculoskeletal: No chest wall abnormality. No acute or significant osseous findings. Review of the MIP images confirms the above findings. CT ABDOMEN and PELVIS FINDINGS Hepatobiliary: No focal liver abnormality. Status post cholecystectomy. No bile duct dilatation. Pancreas: Unremarkable. No pancreatic ductal dilatation or surrounding inflammatory changes. Spleen: Normal in size without focal abnormality. Adrenals/Urinary Tract: Normal adrenal glands. No nephrolithiasis or hydronephrosis. Cyst off the posterior cortex of left kidney measures 2.1 cm, image 34/5. No follow-up imaging recommended. There is asymmetric right-sided perinephric soft tissue stranding. Striated nephrograms identified within the upper and lower pole of the right kidney. No obstructive uropathy identified bilaterally. Thick walled urinary bladder is decompressed around a Foley catheter. Stomach/Bowel: Stomach is within normal limits. Status post appendectomy. No evidence of bowel wall thickening, distention, or inflammatory changes. Vascular/Lymphatic: Aortic atherosclerosis. 4.4 cm infrarenal abdominal aortic aneurysm, image 41/5. No abdominopelvic adenopathy. Reproductive: Status post prostatectomy. Other: No significant free fluid or fluid collections. Musculoskeletal: No acute or significant osseous findings. Lumbar spondylosis. Schmorl's node deformity within the superior endplate of L4. Review of the MIP images confirms the above findings. IMPRESSION: 1. No evidence for acute pulmonary embolus. 2. Small left pleural effusion. 3. Emphysema with diffuse bronchial wall thickening. 4. There is asymmetric right-sided striated nephrograms with perinephric fat stranding. Correlate for clinical signs or symptoms of pyelonephritis. 5. Thick-walled urinary bladder decompressed around a Foley catheter. Correlate for any signs or symptoms of  cystitis. 6. 4.4 cm infrarenal abdominal aortic aneurysm. Recommend follow-up CT or MR as appropriate in 12 months and referral to or continued care with vascular specialist. (Ref.: J Vasc Surg. 2018; 67:2-77 and J Am Coll Radiol 2013;10(10):789-794.) 7. Coronary artery calcifications. 8. Aortic Atherosclerosis (ICD10-I70.0) and Emphysema (ICD10-J43.9). Electronically Signed   By: Kimberley Penman M.D.   On: 02/18/2024 12:28   CT ABDOMEN PELVIS W CONTRAST Result Date: 02/18/2024 CLINICAL DATA:  Sepsis.  Syncope.  History of lung cancer. EXAM: CT ANGIOGRAPHY CHEST CT ABDOMEN AND PELVIS WITH CONTRAST TECHNIQUE: Multidetector CT imaging of the chest was performed using the standard protocol during bolus administration of intravenous contrast. Multiplanar CT image reconstructions and MIPs were obtained to evaluate the vascular anatomy. Multidetector CT imaging of the abdomen and pelvis was performed using the standard protocol during bolus administration of intravenous contrast. RADIATION DOSE REDUCTION: This exam was performed according to the departmental dose-optimization program which includes automated exposure control, adjustment of the mA and/or kV according to patient size and/or use of iterative reconstruction technique. CONTRAST:  OMNIPAQUE  IOHEXOL  350 MG/ML SOLN COMPARISON:  PET-CT 08/02/2023. FINDINGS: CTA CHEST FINDINGS Cardiovascular: Satisfactory opacification of the pulmonary arteries to the segmental level. No evidence of pulmonary embolism. Normal heart size. No pericardial effusion. Aortic atherosclerosis and multi vessel coronary artery calcifications. Mediastinum/Nodes: No enlarged mediastinal, hilar, or axillary lymph nodes. Thyroid  gland, trachea, and esophagus demonstrate no significant  findings. Lungs/Pleura: Status post left upper lobectomy. Small left pleural effusion. Emphysema with diffuse bronchial wall thickening. No airspace consolidation, atelectasis or pneumothorax.  Musculoskeletal: No chest wall abnormality. No acute or significant osseous findings. Review of the MIP images confirms the above findings. CT ABDOMEN and PELVIS FINDINGS Hepatobiliary: No focal liver abnormality. Status post cholecystectomy. No bile duct dilatation. Pancreas: Unremarkable. No pancreatic ductal dilatation or surrounding inflammatory changes. Spleen: Normal in size without focal abnormality. Adrenals/Urinary Tract: Normal adrenal glands. No nephrolithiasis or hydronephrosis. Cyst off the posterior cortex of left kidney measures 2.1 cm, image 34/5. No follow-up imaging recommended. There is asymmetric right-sided perinephric soft tissue stranding. Striated nephrograms identified within the upper and lower pole of the right kidney. No obstructive uropathy identified bilaterally. Thick walled urinary bladder is decompressed around a Foley catheter. Stomach/Bowel: Stomach is within normal limits. Status post appendectomy. No evidence of bowel wall thickening, distention, or inflammatory changes. Vascular/Lymphatic: Aortic atherosclerosis. 4.4 cm infrarenal abdominal aortic aneurysm, image 41/5. No abdominopelvic adenopathy. Reproductive: Status post prostatectomy. Other: No significant free fluid or fluid collections. Musculoskeletal: No acute or significant osseous findings. Lumbar spondylosis. Schmorl's node deformity within the superior endplate of L4. Review of the MIP images confirms the above findings. IMPRESSION: 1. No evidence for acute pulmonary embolus. 2. Small left pleural effusion. 3. Emphysema with diffuse bronchial wall thickening. 4. There is asymmetric right-sided striated nephrograms with perinephric fat stranding. Correlate for clinical signs or symptoms of pyelonephritis. 5. Thick-walled urinary bladder decompressed around a Foley catheter. Correlate for any signs or symptoms of cystitis. 6. 4.4 cm infrarenal abdominal aortic aneurysm. Recommend follow-up CT or MR as appropriate in 12  months and referral to or continued care with vascular specialist. (Ref.: J Vasc Surg. 2018; 67:2-77 and J Am Coll Radiol 2013;10(10):789-794.) 7. Coronary artery calcifications. 8. Aortic Atherosclerosis (ICD10-I70.0) and Emphysema (ICD10-J43.9). Electronically Signed   By: Kimberley Penman M.D.   On: 02/18/2024 12:28   CT Head Wo Contrast Result Date: 02/18/2024 CLINICAL DATA:  Syncope/presyncope with cerebrovascular cause suspected EXAM: CT HEAD WITHOUT CONTRAST TECHNIQUE: Contiguous axial images were obtained from the base of the skull through the vertex without intravenous contrast. RADIATION DOSE REDUCTION: This exam was performed according to the departmental dose-optimization program which includes automated exposure control, adjustment of the mA and/or kV according to patient size and/or use of iterative reconstruction technique. COMPARISON:  09/02/2023 brain MRI FINDINGS: Brain: No evidence of acute infarction, hemorrhage, hydrocephalus, extra-axial collection or mass lesion/mass effect. Small chronic infarct in the parasagittal right more than left parietooccipital cortex Vascular: No hyperdense vessel or unexpected calcification. Skull: Normal. Negative for fracture or focal lesion. Sinuses/Orbits: No acute finding. 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 Electronically Signed   By: Ronnette Coke M.D.   On: 02/18/2024 11:58   DG Chest Port 1 View Result Date: 02/18/2024 CLINICAL DATA:  Sepsis.  Evaluate for abnormality. EXAM: PORTABLE CHEST 1 VIEW COMPARISON:  02/17/2024 FINDINGS: There is a right chest wall port a catheter with tip in the distal SVC. Aortic atherosclerosis. Stable cardiomediastinal contours. No pleural fluid, interstitial edema or airspace disease. IMPRESSION: No acute cardiopulmonary abnormalities. Electronically Signed   By: Kimberley Penman M.D.   On: 02/18/2024 09:52   DG Chest Port 1 View Result Date: 02/17/2024 EXAM: 1 VIEW XRAY OF THE  CHEST 02/17/2024 05:31:00 PM COMPARISON: 02/09/2024 CLINICAL HISTORY: Questionable sepsis - evaluate for abnormality. FINDINGS: LUNGS AND PLEURA: No focal pulmonary opacity. No pulmonary edema. No pleural effusion. No  pneumothorax. HEART AND MEDIASTINUM: No acute abnormality of the cardiac and mediastinal silhouettes. Sclerotic calcifications are present at the aortic arch. BONES AND SOFT TISSUES: No acute osseous abnormality. A right IJ port-a-cath is stable. IMPRESSION: 1. No acute findings. Electronically signed by: Audree Leas MD 02/17/2024 07:05 PM EDT RP Workstation: [id number]   DG Chest Portable 1 View Result Date: 02/09/2024 CLINICAL DATA:  weakness EXAM: PORTABLE CHEST - 1 VIEW COMPARISON:  02/02/2024. FINDINGS: Cardiac silhouette is unremarkable. No pneumothorax or pleural effusion. The lungs are clear. Aorta is calcified. The visualized skeletal structures are unremarkable. Right-sided Port-A-Cath tip overlies distal SVC. IMPRESSION: No acute cardiopulmonary process. Electronically Signed   By: Sydell Eva M.D.   On: 02/09/2024 22:18   ECHOCARDIOGRAM COMPLETE Result Date: 02/03/2024    ECHOCARDIOGRAM REPORT   Patient Name:   Cameron [name] Date of Exam: 02/03/2024 Medical Rec #:  [id number]    Height:       71.0 in Accession #:    [id number]   Weight:       180.0 lb Date of Birth:  March 21, 1951    BSA:          2.016 m Patient Age:    72 years     BP:           131/76 mmHg Patient Gender: M            HR:           89 bpm. Exam Location:  Inpatient Procedure: 2D Echo, Color Doppler and Cardiac Doppler (Both Spectral and Color            Flow Doppler were utilized during procedure). Indications:    R55 Syncope  History:        Patient has no prior history of Echocardiogram examinations.                 CAD; Risk Factors:Hypertension.  Sonographer:    Andrena Bang Referring Phys: Nance Aw LAMA IMPRESSIONS  1. Possible small LVOT gradient but no SAM seen possibly due to poor image quality. Left  ventricular ejection fraction, by estimation, is 65 to 70%. The left ventricle has normal function. The left ventricle has no regional wall motion abnormalities. There is moderate left ventricular hypertrophy. Left ventricular diastolic parameters are consistent with Grade I diastolic dysfunction (impaired relaxation).  2. Right ventricular systolic function is normal. The right ventricular size is normal.  3. The mitral valve is normal in structure. No evidence of mitral valve regurgitation. No evidence of mitral stenosis.  4. The aortic valve is tricuspid. There is mild calcification of the aortic valve. There is mild thickening of the aortic valve. Aortic valve regurgitation is not visualized. Aortic valve sclerosis is present, with no evidence of aortic valve stenosis.  5. The inferior vena cava is normal in size with greater than 50% respiratory variability, suggesting right atrial pressure of 3 mmHg. FINDINGS  Left Ventricle: Possible small LVOT gradient but no SAM seen possibly due to poor image quality. Left ventricular ejection fraction, by estimation, is 65 to 70%. The left ventricle has normal function. The left ventricle has no regional wall motion abnormalities. Definity  contrast agent was given IV to delineate the left ventricular endocardial borders. Strain was performed and the global longitudinal strain is indeterminate. The left ventricular internal cavity size was normal in size. There is moderate left ventricular hypertrophy. Left ventricular diastolic parameters are consistent with Grade I diastolic dysfunction (impaired relaxation). Right Ventricle: The  right ventricular size is normal. No increase in right ventricular wall thickness. Right ventricular systolic function is normal. Left Atrium: Left atrial size was normal in size. Right Atrium: Right atrial size was normal in size. Pericardium: There is no evidence of pericardial effusion. Mitral Valve: The mitral valve is normal in structure.  No evidence of mitral valve regurgitation. No evidence of mitral valve stenosis. Tricuspid Valve: The tricuspid valve is normal in structure. Tricuspid valve regurgitation is trivial. No evidence of tricuspid stenosis. Aortic Valve: The aortic valve is tricuspid. There is mild calcification of the aortic valve. There is mild thickening of the aortic valve. Aortic valve regurgitation is not visualized. Aortic valve sclerosis is present, with no evidence of aortic valve stenosis. Aortic valve mean gradient measures 4.0 mmHg. Aortic valve peak gradient measures 9.2 mmHg. Aortic valve area, by VTI measures 2.89 cm. Pulmonic Valve: The pulmonic valve was normal in structure. Pulmonic valve regurgitation is not visualized. No evidence of pulmonic stenosis. Aorta: The aortic root is normal in size and structure. Venous: The inferior vena cava is normal in size with greater than 50% respiratory variability, suggesting right atrial pressure of 3 mmHg. IAS/Shunts: The interatrial septum was not well visualized. Additional Comments: 3D was performed not requiring image post processing on an independent workstation and was indeterminate.  LEFT VENTRICLE PLAX 2D LVIDd:         3.80 cm     Diastology LVIDs:         2.20 cm     LV e' medial:    4.24 cm/s LV PW:         1.40 cm     LV E/e' medial:  17.2 LV IVS:        1.40 cm     LV e' lateral:   10.10 cm/s LVOT diam:     1.90 cm     LV E/e' lateral: 7.2 LV SV:         71 LV SV Index:   35 LVOT Area:     2.84 cm  LV Volumes (MOD) LV vol d, MOD A2C: 84.0 ml LV vol d, MOD A4C: 73.0 ml LV vol s, MOD A2C: 16.7 ml LV vol s, MOD A4C: 16.1 ml LV SV MOD A2C:     67.3 ml LV SV MOD A4C:     73.0 ml LV SV MOD BP:      64.9 ml RIGHT VENTRICLE RV S prime:     22.90 cm/s TAPSE (M-mode): 1.7 cm LEFT ATRIUM             Index LA Vol (A2C):   36.0 ml 17.86 ml/m LA Vol (A4C):   50.8 ml 25.20 ml/m LA Biplane Vol: 44.9 ml 22.27 ml/m  AORTIC VALVE AV Area (Vmax):    3.13 cm AV Area (Vmean):   3.13  cm AV Area (VTI):     2.89 cm AV Vmax:           152.00 cm/s AV Vmean:          94.300 cm/s AV VTI:            0.245 m AV Peak Grad:      9.2 mmHg AV Mean Grad:      4.0 mmHg LVOT Vmax:         168.00 cm/s LVOT Vmean:        104.000 cm/s LVOT VTI:          0.250 m LVOT/AV VTI ratio:  1.02  AORTA Ao Asc diam: 3.60 cm MITRAL VALVE               TRICUSPID VALVE MV Area (PHT): 2.99 cm    TR Peak grad:   24.4 mmHg MV Decel Time: 254 msec    TR Vmax:        247.00 cm/s MV E velocity: 73.10 cm/s MV A velocity: 87.90 cm/s  SHUNTS MV E/A ratio:  0.83        Systemic VTI:  0.25 m                            Systemic Diam: 1.90 cm Janelle Mediate MD Electronically signed by Janelle Mediate MD Signature Date/Time: 02/03/2024/4:36:14 PM    Final    DG Chest Portable 1 View Result Date: 02/02/2024 CLINICAL DATA:  Syncope. EXAM: PORTABLE CHEST 1 VIEW COMPARISON:  December 06, 2023. FINDINGS: The heart size and mediastinal contours are within normal limits. Right internal jugular Port-A-Cath is noted in grossly good position. Both lungs are clear. The visualized skeletal structures are unremarkable. IMPRESSION: No active disease. Electronically Signed   By: Rosalene Colon M.D.   On: 02/02/2024 14:33     ASSESSMENT AND PLAN: This is a very pleasant 73 years old white male with Stage IIA (T1c, N1, M0) Squamous cell Carcinoma diagnosed in January of 2025. Diagnosed with NSCLC, squamous cell carcinoma, stage IIB due to lymph node involvement and tumor size (2.9 cm). Status post left upper lobectomy with lymph node dissection on October 03, 2023.  He has negative PD-L1 expression. The patient underwent adjuvant systemic chemotherapy with cisplatin  75 Mg/M2 and docetaxel  75 Mg/M2 with Neulasta  support status post 4 cycles.  He has after the last cycle of his treatment with increasing fatigue and weakness as well as anemia. Assessment and Plan    Stage 2A non-small cell lung cancer Status post left upper lobectomy with lymph node  dissection and four cycles of adjuvant systemic chemotherapy with cisplatin  and docetaxel . Currently on observation. Recent imaging (chest, abdomen, pelvis) shows no recurrence or metastasis. Blood counts have recovered post-chemotherapy. Due to recent complications, including sepsis and hospitalization, a follow-up scan is scheduled earlier than usual to ensure no recurrence or complications. - Schedule follow-up scan in three months to monitor for recurrence - Plan scan before September 10th, prior to his planned vacation  Anemia Improving, with hemoglobin increasing from 8.5 to 10.5. Ferritin levels remain low, but overall blood counts are recovering.  Suspicious Sepsis (resolved) Recent episode, now resolved. No clear source of infection identified on imaging or laboratory tests. He experienced significant distress and multiple hospitalizations during the episode.  Swelling of legs Likely related to fluid overload during hospitalization. Resolved with catheterization and fluid management.  Hyperglycemia Blood sugar remains elevated at 162 mg/dL. Monitoring and management of blood glucose levels are ongoing. Adjustments to current management may be necessary based on future readings.   The patient was advised to call immediately if he has any other concerning symptoms in the interval. The patient voices understanding of current disease status and treatment options and is in agreement with the current care plan.  All questions were answered. The patient knows to call the clinic with any problems, questions or concerns. We can certainly see the patient much sooner if necessary. The total time spent in the appointment was 30 minutes including review of chart and various tests results, discussions about plan of care  and coordination of care plan .   Disclaimer: This note was dictated with voice recognition software. Similar sounding words can inadvertently be transcribed and may not be corrected  upon review.

## 2024-03-02 DIAGNOSIS — H5315 Visual distortions of shape and size: Secondary | ICD-10-CM | POA: Diagnosis not present

## 2024-03-02 DIAGNOSIS — H5310 Unspecified subjective visual disturbances: Secondary | ICD-10-CM | POA: Diagnosis not present

## 2024-03-02 DIAGNOSIS — H35721 Serous detachment of retinal pigment epithelium, right eye: Secondary | ICD-10-CM | POA: Diagnosis not present

## 2024-03-08 DIAGNOSIS — N35919 Unspecified urethral stricture, male, unspecified site: Secondary | ICD-10-CM | POA: Diagnosis not present

## 2024-03-08 DIAGNOSIS — R339 Retention of urine, unspecified: Secondary | ICD-10-CM | POA: Diagnosis not present

## 2024-03-13 ENCOUNTER — Encounter: Admitting: Nurse Practitioner

## 2024-03-13 ENCOUNTER — Telehealth: Payer: Self-pay | Admitting: Nurse Practitioner

## 2024-03-13 NOTE — Progress Notes (Deleted)
   Established Patient Office Visit  Subjective   Patient ID: Cameron [name], male    DOB: 12/17/1950  Age: 72 y.o. MRN: [id number]  No chief complaint on file.   HPI  HTN: Patient currently maintained on hydrochlorothiazide  12.5 mg daily along with potassium 20 mEq daily  GERD: Currently maintained on Protonix  40 mg daily  Lung CA: Patient currently being followed by Dr. Randall Bush following [place]  COPD: Patient currently maintained on Advair  1 puff twice daily.  Albuterol  inhaler?  Some  for complete physical and follow up of chronic conditions.  Immunizations: -Tetanus: Completed in 2021 -Influenza: Out of season -Shingles:? -Pneumonia: Completed   Diet: Fair diet.  Exercise: No regular exercise.  Eye exam: Completes annually  Dental exam: Completes semi-annually    Colonoscopy: Completed in 12/06/2014, recall 5 years Lung Cancer Screening: N/A  PSA: Due     {History (Optional):23778}  ROS    Objective:     There were no vitals taken for this visit. {Vitals History (Optional):23777}  Physical Exam   No results found for any visits on 03/13/24.  {Labs (Optional):23779}  The 10-year ASCVD risk score (Arnett DK, et al., 2019) is: 23.4%    Assessment & Plan:   Problem List Items Addressed This Visit   None   No follow-ups on file.    Margarie Shay, NP

## 2024-03-13 NOTE — Telephone Encounter (Signed)
 Patient states he is out of medication and is concerned about having labs done to continue was too late to be seen today for Physical, Scheduled for just ov on thurs to address concerns

## 2024-03-14 ENCOUNTER — Other Ambulatory Visit: Payer: Self-pay | Admitting: Nurse Practitioner

## 2024-03-14 DIAGNOSIS — E876 Hypokalemia: Secondary | ICD-10-CM

## 2024-03-15 ENCOUNTER — Other Ambulatory Visit (HOSPITAL_COMMUNITY): Payer: Self-pay

## 2024-03-15 ENCOUNTER — Ambulatory Visit: Admitting: Nurse Practitioner

## 2024-03-15 ENCOUNTER — Other Ambulatory Visit: Payer: Self-pay

## 2024-03-15 MED ORDER — POTASSIUM CHLORIDE CRYS ER 20 MEQ PO TBCR
20.0000 meq | EXTENDED_RELEASE_TABLET | Freq: Every day | ORAL | 0 refills | Status: DC
  Filled 2024-03-15: qty 30, 30d supply, fill #0

## 2024-03-16 ENCOUNTER — Encounter: Payer: Self-pay | Admitting: Nurse Practitioner

## 2024-03-16 ENCOUNTER — Other Ambulatory Visit: Payer: Self-pay | Admitting: Nurse Practitioner

## 2024-03-16 ENCOUNTER — Telehealth: Payer: Self-pay | Admitting: Nurse Practitioner

## 2024-03-16 ENCOUNTER — Ambulatory Visit: Admitting: Nurse Practitioner

## 2024-03-16 ENCOUNTER — Other Ambulatory Visit: Payer: Self-pay | Admitting: Internal Medicine

## 2024-03-16 ENCOUNTER — Other Ambulatory Visit (HOSPITAL_COMMUNITY): Payer: Self-pay

## 2024-03-16 VITALS — BP 98/52 | HR 85 | Temp 97.6°F | Ht 69.25 in | Wt 193.0 lb

## 2024-03-16 DIAGNOSIS — I10 Essential (primary) hypertension: Secondary | ICD-10-CM

## 2024-03-16 DIAGNOSIS — E876 Hypokalemia: Secondary | ICD-10-CM | POA: Diagnosis not present

## 2024-03-16 LAB — BASIC METABOLIC PANEL WITH GFR
BUN: 19 mg/dL (ref 6–23)
CO2: 29 meq/L (ref 19–32)
Calcium: 9 mg/dL (ref 8.4–10.5)
Chloride: 99 meq/L (ref 96–112)
Creatinine, Ser: 1.5 mg/dL (ref 0.40–1.50)
GFR: 46.71 mL/min — ABNORMAL LOW (ref >60.00)
Glucose, Bld: 102 mg/dL — ABNORMAL HIGH (ref 70–99)
Potassium: 5 meq/L (ref 3.5–5.1)
Sodium: 137 meq/L (ref 135–145)

## 2024-03-16 NOTE — Assessment & Plan Note (Signed)
 History of the same currently being repleted patient Klor-Con  20 mEq daily.  Pending BMP today

## 2024-03-16 NOTE — Assessment & Plan Note (Signed)
 Patient currently maintained on hydrochlorothiazide  12.5 mg daily.  Blood pressure on the lower end of normal patient is asymptomatic in office we will continue HCTZ as prescribed.  He is also on 20 mEq of potassium daily

## 2024-03-16 NOTE — Telephone Encounter (Unsigned)
 Copied from CRM [id number]. Topic: Clinical - Medication Question >> Mar 16, 2024 12:59 PM Cameron [name] wrote: Reason for CRM: Patient is calling in because he was told he was supposed to drop to 20mg  on his pantoprazole  (PROTONIX ) 40 MG tablet  He wondering if he still needs to drop if dosage if so can a new prescription be sent in

## 2024-03-16 NOTE — Progress Notes (Signed)
   Established Patient Office Visit  Subjective   Patient ID: Cameron [name], male    DOB: December 06, 1950  Age: 73 y.o. MRN: [id number]  Chief Complaint  Patient presents with   Annual Exam    HPI  HTN: Patient currently taking hydrochlorothiazide  12.5 mg daily. He is doing well with BP. States that   States that he is doing PT at home and that will end next week. He has not had any falls since may. He has had some close calls. States that he will use the walking aid if he feels unsteady.   Lung cancer: Patient currently being followed by Dr. Randall Bush through oncology. He does have copd and is doing advair  and using it as prescribed. He uses the albuterol  inhaler if needed but has not used it in an extended period of time         Review of Systems  Constitutional:  Negative for chills and fever.  Respiratory:  Negative for shortness of breath (baseline).   Cardiovascular:  Negative for chest pain.  Neurological:  Negative for dizziness and headaches.      Objective:     BP (!) 98/52   Pulse 85   Temp 97.6 F (36.4 C) (Oral)   Ht 5' 9.25 (1.759 m)   Wt 193 lb (87.5 kg)   SpO2 96%   BMI 28.30 kg/m  BP Readings from Last 3 Encounters:  03/16/24 (!) 98/52  03/01/24 118/62  02/23/24 118/60   Wt Readings from Last 3 Encounters:  03/16/24 193 lb (87.5 kg)  03/01/24 197 lb 3.2 oz (89.4 kg)  02/23/24 202 lb 3.2 oz (91.7 kg)   SpO2 Readings from Last 3 Encounters:  03/16/24 96%  03/01/24 100%  02/23/24 99%      Physical Exam Vitals and nursing note reviewed.  Constitutional:      Appearance: Normal appearance.   Cardiovascular:     Rate and Rhythm: Normal rate and regular rhythm.     Heart sounds: Normal heart sounds.  Pulmonary:     Effort: Pulmonary effort is normal.     Comments: Decreased   Musculoskeletal:     Right lower leg: No edema.     Left lower leg: No edema.   Neurological:     Mental Status: He is alert.      No results found  for any visits on 03/16/24.    The 10-year ASCVD risk score (Arnett DK, et al., 2019) is: 17.4%    Assessment & Plan:   Problem List Items Addressed This Visit       Cardiovascular and Mediastinum   HTN (hypertension), benign - Primary   Patient currently maintained on hydrochlorothiazide  12.5 mg daily.  Blood pressure on the lower end of normal patient is asymptomatic in office we will continue HCTZ as prescribed.  He is also on 20 mEq of potassium daily      Relevant Orders   Basic metabolic panel with GFR     Other   Hypokalemia   History of the same currently being repleted patient Klor-Con  20 mEq daily.  Pending BMP today      Relevant Orders   Basic metabolic panel with GFR    Return in about 3 months (around 06/16/2024) for CPE and Labs.    Margarie Shay, NP

## 2024-03-16 NOTE — Patient Instructions (Signed)
 Nice to see you today I will be in touch with the lab results Follow up with me in 3 months

## 2024-03-18 MED ORDER — MAGNESIUM OXIDE -MG SUPPLEMENT 400 (240 MG) MG PO TABS
400.0000 mg | ORAL_TABLET | Freq: Three times a day (TID) | ORAL | 0 refills | Status: AC
  Filled 2024-03-18: qty 90, 30d supply, fill #0

## 2024-03-19 ENCOUNTER — Encounter: Payer: Self-pay | Admitting: Internal Medicine

## 2024-03-19 ENCOUNTER — Other Ambulatory Visit (HOSPITAL_COMMUNITY): Payer: Self-pay

## 2024-03-19 ENCOUNTER — Other Ambulatory Visit: Payer: Self-pay

## 2024-03-19 ENCOUNTER — Telehealth: Payer: Self-pay

## 2024-03-19 NOTE — Telephone Encounter (Signed)
 I dont remember having this conversation with him. Does he know which provider mentioned this?

## 2024-03-19 NOTE — Telephone Encounter (Addendum)
 Spoke with patient and pt's wife at check out and told them to hold the potassium for now and that matt still needs to review the lab results that were reported on Friday. Pt has concerns about GFR levels.

## 2024-03-19 NOTE — Telephone Encounter (Signed)
 Copied from CRM [id number]. Topic: Clinical - Medical Advice >> Mar 19, 2024  7:37 AM Donna BRAVO wrote: Reason for CRM: patient wife Nathanel asking about labs and taking potassium, patient wife has appt with Dr Rilla today and would like to have this information at this time.

## 2024-03-20 ENCOUNTER — Telehealth: Payer: Self-pay

## 2024-03-20 ENCOUNTER — Other Ambulatory Visit (HOSPITAL_COMMUNITY): Payer: Self-pay

## 2024-03-20 ENCOUNTER — Other Ambulatory Visit: Payer: Self-pay | Admitting: Nurse Practitioner

## 2024-03-20 DIAGNOSIS — R944 Abnormal results of kidney function studies: Secondary | ICD-10-CM

## 2024-03-20 DIAGNOSIS — J432 Centrilobular emphysema: Secondary | ICD-10-CM

## 2024-03-20 NOTE — Telephone Encounter (Signed)
 Reviewed renal function which does show mild AKI.  Most common cause of this is dehydration.  Ask about oral intake of fluids over the last 2 weeks. Consider holding hydrochlorothiazide  in the potential setting of dehydration and AKI, especially given BP reading on 03/16/24.  Will defer to PCP.  BP Readings from Last 3 Encounters:  03/16/24 (!) 98/52  03/01/24 118/62  02/23/24 118/60

## 2024-03-20 NOTE — Telephone Encounter (Signed)
 Left detailed voicemail for patient to call the office back.

## 2024-03-20 NOTE — Telephone Encounter (Signed)
 Copied from CRM [id number]. Topic: Clinical - Lab/Test Results >> Mar 20, 2024 12:06 PM Berneda FALCON wrote: Reason for CRM: Wife Donny is calling back to check on what they should do about the GFR levels getting so low (46.71). Nurse told her they would hear back yesterday but they did not hear back from them so they want to be sure they get a call today please. They are concerned.  Please call back and discuss with patient or [phone] (wife) or patient at [phone].

## 2024-03-21 ENCOUNTER — Ambulatory Visit: Payer: Self-pay | Admitting: Nurse Practitioner

## 2024-03-21 ENCOUNTER — Other Ambulatory Visit (HOSPITAL_COMMUNITY): Payer: Self-pay

## 2024-03-21 MED ORDER — FLUTICASONE-SALMETEROL 250-50 MCG/ACT IN AEPB
1.0000 | INHALATION_SPRAY | Freq: Two times a day (BID) | RESPIRATORY_TRACT | 0 refills | Status: DC
  Filled 2024-03-21: qty 180, 90d supply, fill #0

## 2024-03-21 NOTE — Telephone Encounter (Signed)
 Agree with assessment. If he is hydrating well. We can recheck and if decreasing or not improving we can consider holding the hydrochlorothiazide 

## 2024-03-21 NOTE — Addendum Note (Signed)
 Addended by: HOPE VEVA PARAS on: 03/21/2024 03:39 PM   Modules accepted: Orders

## 2024-03-21 NOTE — Telephone Encounter (Signed)
 Verbal orders for PT ok

## 2024-03-21 NOTE — Addendum Note (Signed)
 Addended by: WENDEE LYNWOOD HERO on: 03/21/2024 03:00 PM   Modules accepted: Orders

## 2024-03-21 NOTE — Telephone Encounter (Signed)
 Called pt and scheduled lab visit to have GFR levels rechecked. 03/23/24. Advised to drink 50-60 oz of water  a day. Pt would like an update on Phyiscal Therapy.  Wife states that [facility] needs verbal orders to start PT.

## 2024-03-21 NOTE — Telephone Encounter (Signed)
 Contacted [facility] to approve verbal orders.  No answer. Left a voicemail for nurse to call back.

## 2024-03-22 ENCOUNTER — Other Ambulatory Visit (HOSPITAL_COMMUNITY): Payer: Self-pay

## 2024-03-22 NOTE — Telephone Encounter (Signed)
 Left VM for [facility] nurse to be aware of verbal orders for patients PT.

## 2024-03-23 ENCOUNTER — Other Ambulatory Visit (INDEPENDENT_AMBULATORY_CARE_PROVIDER_SITE_OTHER)

## 2024-03-23 ENCOUNTER — Other Ambulatory Visit (HOSPITAL_BASED_OUTPATIENT_CLINIC_OR_DEPARTMENT_OTHER): Payer: Self-pay

## 2024-03-23 ENCOUNTER — Telehealth: Payer: Self-pay | Admitting: Nurse Practitioner

## 2024-03-23 ENCOUNTER — Other Ambulatory Visit (HOSPITAL_COMMUNITY): Payer: Self-pay

## 2024-03-23 ENCOUNTER — Telehealth: Payer: Self-pay

## 2024-03-23 DIAGNOSIS — R944 Abnormal results of kidney function studies: Secondary | ICD-10-CM | POA: Diagnosis not present

## 2024-03-23 LAB — BASIC METABOLIC PANEL WITH GFR
BUN: 20 mg/dL (ref 6–23)
CO2: 29 meq/L (ref 19–32)
Calcium: 9.1 mg/dL (ref 8.4–10.5)
Chloride: 97 meq/L (ref 96–112)
Creatinine, Ser: 1.39 mg/dL (ref 0.40–1.50)
GFR: 51.17 mL/min — ABNORMAL LOW (ref >60.00)
Glucose, Bld: 104 mg/dL — ABNORMAL HIGH (ref 70–99)
Potassium: 4.4 meq/L (ref 3.5–5.1)
Sodium: 134 meq/L — ABNORMAL LOW (ref 135–145)

## 2024-03-23 MED ORDER — PANTOPRAZOLE SODIUM 20 MG PO TBEC
20.0000 mg | DELAYED_RELEASE_TABLET | Freq: Every day | ORAL | 0 refills | Status: DC
  Filled 2024-03-23: qty 30, 30d supply, fill #0

## 2024-03-23 NOTE — Telephone Encounter (Signed)
 Copied from CRM [id number]. Topic: Clinical - Request for Lab/Test Order >> Mar 23, 2024 12:30 PM Robinson H wrote: Reason for CRM: Joya calling to follow up on order faxed to office on 6/18 for add on discipline order #[id number]  Joya Dux- [phone]

## 2024-03-23 NOTE — Telephone Encounter (Unsigned)
 Copied from CRM [id number]. Topic: Clinical - Prescription Issue >> Mar 23, 2024  8:09 AM Treva T wrote: Reason for CRM: Patient spouse calling, regarding medication, pantoprazole  (PROTONIX ) 40 MG tablet. Per caller states patient was informed to decrease Protonix  from 40mg  to 20mg .  Per wife, a call regarding this was placed about a week ago and received no response. Patient is now out of medication, and symptoms of hiccups, indigestion, and burping have returned and not getting any better.  Patient has a lab appointment today, and patient spouse is requesting he is weighed at this appointment.  Patient spouse requesting a follow up call to discuss further as soon as possible, as they are leaving to go out of town in the morning. Can be reached at [phone], or [phone].  Preferred Pharmacy:  [facility] [address] [place] [address] Phone: [phone] Fax: [phone]

## 2024-03-23 NOTE — Addendum Note (Signed)
 Addended by: WENDEE LYNWOOD HERO on: 03/23/2024 02:12 PM   Modules accepted: Orders

## 2024-03-23 NOTE — Telephone Encounter (Signed)
 I had sent a message trying to figure out who prescribed the protonix . It looks like it came from the hosptialist. I will send in a supply of the 20mg  protonix 

## 2024-03-23 NOTE — Telephone Encounter (Signed)
 He was on 20 mg of protonic, also continue therapy at [facility].   Copied from CRM [id number]. Topic: Clinical - Medical Advice >> Mar 23, 2024  9:22 AM Chasity T wrote: Reason for CRM: Patient and his wife is calling in because he states he had a message come in on myChart this morning to give office a call. Unable to locate the message for me to give him any information. Patient wife also mentioned that when he went for lab work this morning that his weight has went down to 186.8 and his PCP mentioned he did not want him to lose anymore weight.

## 2024-03-26 NOTE — Telephone Encounter (Signed)
 LVM for Cameron [name]    #[id number] Cameron [name]- [phone]

## 2024-03-27 ENCOUNTER — Telehealth: Payer: Self-pay

## 2024-03-27 ENCOUNTER — Other Ambulatory Visit (HOSPITAL_COMMUNITY): Payer: Self-pay

## 2024-03-27 ENCOUNTER — Other Ambulatory Visit: Payer: Self-pay | Admitting: Nurse Practitioner

## 2024-03-27 DIAGNOSIS — E782 Mixed hyperlipidemia: Secondary | ICD-10-CM

## 2024-03-27 DIAGNOSIS — I10 Essential (primary) hypertension: Secondary | ICD-10-CM

## 2024-03-27 NOTE — Telephone Encounter (Signed)
 Copied from CRM [id number]. Topic: General - Other >> Mar 27, 2024  9:07 AM Rosina BIRCH wrote: Reason for CRM: patient wife called stating the patient has finished his home health last week now the patient has been waiting for two weeks for [facility] to call him for an appointment for his balance and strengthening. CB [phone]

## 2024-03-28 ENCOUNTER — Ambulatory Visit: Payer: Self-pay | Admitting: Nurse Practitioner

## 2024-03-28 ENCOUNTER — Other Ambulatory Visit (HOSPITAL_COMMUNITY): Payer: Self-pay

## 2024-03-28 DIAGNOSIS — C61 Malignant neoplasm of prostate: Secondary | ICD-10-CM | POA: Diagnosis not present

## 2024-03-28 MED ORDER — HYDROCHLOROTHIAZIDE 12.5 MG PO CAPS
12.5000 mg | ORAL_CAPSULE | Freq: Every day | ORAL | 1 refills | Status: DC
  Filled 2024-03-28: qty 90, 90d supply, fill #0
  Filled 2024-06-27: qty 90, 90d supply, fill #1

## 2024-03-29 NOTE — Telephone Encounter (Signed)
 To verfiy this is for PT?

## 2024-03-29 NOTE — Telephone Encounter (Signed)
 Patient needs referral for [facility]; does he need another appt or okay to do referral?

## 2024-03-29 NOTE — Telephone Encounter (Unsigned)
 Copied from CRM [id number]. Topic: Referral - Status >> Mar 29, 2024 10:11 AM Cameron [name] wrote: Reason for CRM: Patient is calling because he called [facility] to schedule an appointment  but was informed that they had not received the order/referral.

## 2024-04-02 ENCOUNTER — Telehealth: Payer: Self-pay | Admitting: Nurse Practitioner

## 2024-04-02 ENCOUNTER — Telehealth: Payer: Self-pay

## 2024-04-02 ENCOUNTER — Other Ambulatory Visit (HOSPITAL_COMMUNITY): Payer: Self-pay

## 2024-04-02 DIAGNOSIS — E782 Mixed hyperlipidemia: Secondary | ICD-10-CM

## 2024-04-02 DIAGNOSIS — R296 Repeated falls: Secondary | ICD-10-CM

## 2024-04-02 NOTE — Telephone Encounter (Signed)
 Atorvastatin  was denied because he should not be out yet. Should have at least 2 months left or a refill at the pharmacy  I do not have benazepril  on the patients list of medications at all. Last office visit of 03/16/2024 all we had listed for htn was hydrochlorothiazide  and BP was borderline low then

## 2024-04-02 NOTE — Telephone Encounter (Signed)
 Copied from CRM [id number]. Topic: General - Other >> Apr 02, 2024 11:13 AM Gennette ORN wrote: Reason for CRM: Patient wife stating she needs more documentation for the home health order to be sent to [facility] fax number [phone].

## 2024-04-02 NOTE — Telephone Encounter (Signed)
 Copied from CRM [id number]. Topic: Clinical - Medication Question >> Apr 02, 2024  8:58 AM Cleave MATSU wrote: Reason for CRM: pt wants to know why atorvastatin  and benazepril  was denied by dr please follow up with patient.

## 2024-04-02 NOTE — Telephone Encounter (Signed)
 Referral has been placed.

## 2024-04-03 ENCOUNTER — Other Ambulatory Visit (HOSPITAL_COMMUNITY): Payer: Self-pay

## 2024-04-03 NOTE — Telephone Encounter (Signed)
 Called pt. Relayed information. Pt verbalized understanding.  Stated he was confused and thought he was taking both benzapril and hydrochlorothiazide . Pt requested Atorvastin to be transferred to [facility].   Contacted pharmacy to have them transfer medication.  Pharmacist Gl[place]ia states he will get script ready today.

## 2024-04-04 DIAGNOSIS — R8271 Bacteriuria: Secondary | ICD-10-CM | POA: Diagnosis not present

## 2024-04-04 DIAGNOSIS — R339 Retention of urine, unspecified: Secondary | ICD-10-CM | POA: Diagnosis not present

## 2024-04-04 DIAGNOSIS — C61 Malignant neoplasm of prostate: Secondary | ICD-10-CM | POA: Diagnosis not present

## 2024-04-07 ENCOUNTER — Other Ambulatory Visit (HOSPITAL_COMMUNITY): Payer: Self-pay

## 2024-04-09 ENCOUNTER — Telehealth: Payer: Self-pay | Admitting: Nurse Practitioner

## 2024-04-09 DIAGNOSIS — R296 Repeated falls: Secondary | ICD-10-CM

## 2024-04-09 NOTE — Telephone Encounter (Signed)
 Copied from CRM [id number]. Topic: Referral - Question >> Apr 09, 2024 10:19 AM Geroldine GRADE wrote: Reason for CRM: Patient is calling because [facility] were unable to see him and recommended he see someone in the [facility] network, they told him they faxed over the information for the denial.  Patient would like anyone recommended or a facility for physical strengthening physical therapy

## 2024-04-09 NOTE — Addendum Note (Signed)
 Addended by: WENDEE LYNWOOD HERO on: 04/09/2024 12:12 PM   Modules accepted: Orders

## 2024-04-09 NOTE — Telephone Encounter (Signed)
 Called pt and informed him of referral to new PT provider.  Pt verbalized understanding and has no questions or concerns.

## 2024-04-09 NOTE — Telephone Encounter (Signed)
 Referral placed for a different PT provider

## 2024-04-10 ENCOUNTER — Other Ambulatory Visit (HOSPITAL_COMMUNITY): Payer: Self-pay

## 2024-04-13 ENCOUNTER — Other Ambulatory Visit (HOSPITAL_COMMUNITY): Payer: Self-pay

## 2024-04-13 NOTE — Therapy (Signed)
 OUTPATIENT PHYSICAL THERAPY LOWER EXTREMITY EVALUATION   Patient Name: Cameron [name] MRN: [id number] DOB:10/13/1950, 73 y.o., male Today's Date: 04/16/2024  END OF SESSION:  PT End of Session - 04/16/24 1557     Visit Number 1    Number of Visits 6    Date for PT Re-Evaluation 06/17/24    Authorization Type HTA    PT Start Time 1045    PT Stop Time 1130    PT Time Calculation (min) 45 min    Activity Tolerance Patient tolerated treatment well    Behavior During Therapy [facility] for tasks assessed/performed          Past Medical History:  Diagnosis Date   Adenomatous colon polyp 05/2009; 11/2014   2010:Tubular adenoma, no high grade dysplasia.SABRA  + Hyperplastic polyps. 2016: Tubular adenoma-rpt 5 yrs.   Cancer (HCC)    TURP for prostate cancer   COPD (chronic obstructive pulmonary disease) (HCC)    Coronary artery disease    Difficult intubation    H/O chronic gastritis 05/2009   EGD: no h.pylori,dysplasia,or evidence of malignancy   Hyperlipidemia    lovaza from prior PMD-stopped due to cost   Hypertension    Obesity, Class I, BMI 30.0-34.9 (see actual BMI) 04/23/2014   Tobacco dependence    Ventral hernia    small   Past Surgical History:  Procedure Laterality Date   APPENDECTOMY  09/28/1971   BIOPSY  08/15/2023   Procedure: BIOPSY;  Surgeon: Brenna Adine CROME, DO;  [facility]: [facility];  Service: Pulmonary;;   BLADDER DIVERTICULECTOMY N/A 09/30/2022   Procedure: BLADDER DIVERTICULECTOMY;  Surgeon: Renda Glance, MD;  Location: [facility];  Service: Urology;  Laterality: N/A;   CARPAL TUNNEL RELEASE Left    CHOLECYSTECTOMY N/A 07/06/2018   Procedure: LAPAROSCOPIC CHOLECYSTECTOMY WITH INTRAOPERATIVE CHOLANGIOGRAM;  Surgeon: Ethyl Lenis, MD;  Location: [facility] OR;  Service: General;  Laterality: N/A;   COLONOSCOPY  09/27/2008   [facility]   CYSTOSCOPY N/A 09/30/2022   Procedure: PHYLLIS SIDE;  Surgeon: Renda Glance, MD;  Location: [facility];  Service: Urology;  Laterality:  N/A;   EYE SURGERY  2018   Cataracts removed only   FINGER SURGERY Left 12/2021   Left middle finger, laceration   HEMOSTASIS CONTROL  08/15/2023   Procedure: HEMOSTASIS CONTROL;  Surgeon: Brenna Adine CROME, DO;  Location: [facility];  Service: Pulmonary;;   INTERCOSTAL NERVE BLOCK Left 10/03/2023   Procedure: INTERCOSTAL NERVE BLOCK;  Surgeon: Kerrin Elspeth BROCKS, MD;  Location: [facility] OR;  Service: Thoracic;  Laterality: Left;   IR IMAGING GUIDED PORT INSERTION  12/08/2023   LYMPH NODE BIOPSY Left 10/03/2023   Procedure: LYMPH NODE BIOPSY;  Surgeon: Kerrin Elspeth BROCKS, MD;  Location: [facility];  Service: Thoracic;  Laterality: Left;   ROBOT ASSISTED LAPAROSCOPIC RADICAL PROSTATECTOMY N/A 09/30/2022   Procedure: XI ROBOTIC ASSISTED LAPAROSCOPIC RADICAL PROSTATECTOMY LEVEL 3;  Surgeon: Renda Glance, MD;  Location: [facility];  Service: Urology;  Laterality: N/A;  270 MINUTES NEEDED FOR CASE   STRABISMUS SURGERY  age 40 or 9   TRANSURETHRAL RESECTION OF PROSTATE N/A 07/08/2022   Procedure: TRANSURETHRAL RESECTION OF THE PROSTATE (TURP)/ CYSTOSCOPY;  Surgeon: Renda Glance, MD;  Location: [facility];  Service: Urology;  Laterality: N/A;   VIDEO BRONCHOSCOPY N/A 08/15/2023   Procedure: VIDEO BRONCHOSCOPY WITHOUT FLUORO;  Surgeon: Brenna Adine CROME, DO;  Location: [facility];  Service: Pulmonary;  Laterality: N/A;   Patient Active Problem List   Diagnosis Date Noted  Hypokalemia 03/16/2024   Abnormal CBC 02/23/2024   SIRS (systemic inflammatory response syndrome) (HCC) 02/19/2024   Multiple falls 02/10/2024   Lower extremity edema 02/10/2024   Urinary retention 02/10/2024   Hospital discharge follow-up 02/10/2024   Syncope 02/02/2024   AKI (acute kidney injury) (HCC) 02/02/2024   Port-A-Cath in place 12/28/2023   Primary squamous cell carcinoma of bronchus of left upper lobe (HCC) 11/10/2023   S/P lobectomy of lung 10/03/2023   Lung nodule 08/15/2023   Acute respiratory infection 06/28/2023   Loss  of equilibrium 11/25/2022   Insomnia 11/25/2022   History of polycythemia 11/25/2022   Benign prostatic hyperplasia 07/08/2022   Acute cough 06/23/2022   Upper respiratory tract infection 06/23/2022   Preventative health care 05/04/2022   Abnormal hemoglobin (Hgb) (HCC) 04/08/2020   Need for hepatitis C screening test 04/26/2018   Centrilobular emphysema (HCC) 04/20/2018   Polycythemia 04/20/2018   CAD (coronary artery disease) 03/31/2018   Visit for preventive health examination 02/27/2018   Overweight (BMI 25.0-29.9) 04/23/2014   Tobacco dependence 12/29/2012   Health maintenance examination 03/13/2012   Hyperlipemia, mixed 03/13/2012   HTN (hypertension), benign 02/11/2012    PCP: Wendee Lynwood HERO, NP  REFERRING PROVIDER: Wendee Lynwood HERO, NP  REFERRING DIAG: R29.6 (ICD-10-CM) - Multiple falls  THERAPY DIAG:  Unsteadiness on feet  Muscle weakness (generalized)  Repeated falls  Rationale for Evaluation and Treatment: Rehabilitation  ONSET DATE: chronic  SUBJECTIVE:   SUBJECTIVE STATEMENT: States that he is doing PT at home and that will end next week. He has not had any falls since may. He has had some close calls. States that he will use the walking aid if he feels unsteady.   PERTINENT HISTORY: History of falls following treatment for lung CA and chemo earlier this year. PAIN:  Are you having pain? No  PRECAUTIONS: Fall  RED FLAGS: None   WEIGHT BEARING RESTRICTIONS: No  FALLS:  Has patient fallen in last 6 months? Yes. Number of falls 3  LIVING ENVIRONMENT: Lives with: lives with their family Lives in: House/apartment Stairs: yes Has following equipment at home: None  OCCUPATION: stage hand  PLOF: Independent  PATIENT GOALS: To prevent future falls  NEXT MD VISIT: TBD  OBJECTIVE:  Note: Objective measures were completed at Evaluation unless otherwise noted.  DIAGNOSTIC FINDINGS: none  PATIENT SURVEYS:   Patient-specific activity scoring  scheme (Point to one number):  0 represents "unable to perform." 10 represents "able to perform at prior level. 0 1 2 3 4 5 6 7 8 9  10 (Date and Score) Activity Initial  Activity Eval     Yard work 5     Walking 30 min  7    Arise from chair w/o arms 7    Total score = sum of the activity scores/number of activities Minimum detectable change (90%CI) for average score = 2 points Minimum detectable change (90%CI) for single activity score = 3 points PSFS developed by: Rosalee MYRTIS Marvis KYM Charlet HERO., & Binkley, J. (1995). Assessing disability and change on individual  patients: a report of a patient specific measure. Physiotherapy [place], 47, [phone]. Reproduced with the permission of the authors  Score: 19/30 63% perceived function    MUSCLE LENGTH: N/T  POSTURE: rounded shoulders and forward head  LOWER EXTREMITY ROM: WNL for gait and transfers  Active ROM Right eval Left eval  Hip flexion    Hip extension    Hip abduction    Hip adduction  Hip internal rotation    Hip external rotation    Knee flexion    Knee extension    Ankle dorsiflexion    Ankle plantarflexion    Ankle inversion    Ankle eversion     (Blank rows = not tested)  LOWER EXTREMITY MMT: See 30s chair stand test  MMT Right eval Left eval  Hip flexion    Hip extension    Hip abduction    Hip adduction    Hip internal rotation    Hip external rotation    Knee flexion    Knee extension    Ankle dorsiflexion    Ankle plantarflexion    Ankle inversion    Ankle eversion     (Blank rows = not tested)  FUNCTIONAL TESTS:  30 seconds chair stand test 8 reps arms crossed  mCTSIB 30s all 4 positions SLS R<15s, L 15s   GAIT: Distance walked: 77ftx2 Assistive device utilized: None Level of assistance: Complete Independence Comments: slow cadence                                                                                                                                 TREATMENT:  [facility] Adult PT Treatment:                                                DATE: 04/16/24  Self Care: Additional minutes spent for educating on updated Therapeutic Home Exercise Program as well as comparing current status to condition at start of symptoms. This included exercises focusing on stretching, strengthening, with focus on eccentric aspects. Long term goals include an improvement in range of motion, strength, endurance as well as avoiding reinjury. Patient's frequency would include in 1-2 times a day, 3-5 times a week for a duration of 6-12 weeks. Proper technique shown and discussed handout in great detail. All questions were discussed and addressed.      PATIENT EDUCATION:  Education details: Discussed eval findings, rehab rationale and POC and patient is in agreement  Person educated: Patient Education method: Explanation and Handouts Education comprehension: verbalized understanding and needs further education  HOME EXERCISE PROGRAM: Access Code: [email or web address] URL: [email or web address] Date: 04/16/2024 Prepared by: Reyes Kohut  Exercises - Sit to Stand with Arms Crossed  - 1-2 x daily - 5 x weekly - 1 sets - 10 reps - Single Leg Stance  - 1-2 x daily - 5 x weekly - 1 sets - 20s hold  ASSESSMENT:  CLINICAL IMPRESSION: Patient is a 73 y.o. male who was seen today for physical therapy evaluation and treatment for frequent falls following treatment for lung CA. Patient recovering from radiation therapy.  LE ROM WFL, static balance is good, 30s chair stand test finds mild LE strength deficits and SLS diminished on R.  Endurance  deficits noted as patient had L upper lung lobe removed  OBJECTIVE IMPAIRMENTS: Abnormal gait, cardiopulmonary status limiting activity, decreased activity tolerance, decreased balance, decreased endurance, difficulty walking, and decreased strength.   ACTIVITY LIMITATIONS: carrying, lifting, standing, and stairs  PERSONAL  FACTORS: Age and Fitness are also affecting patient's functional outcome.   REHAB POTENTIAL: Good  CLINICAL DECISION MAKING: Stable/uncomplicated  EVALUATION COMPLEXITY: Low   GOALS: Goals reviewed with patient? No    SHORT TERM GOALS=LONG TERM GOALS: Target date: 06/17/24  Patient to demonstrate independence in HEP  Baseline: [email or web address] Goal status: INITIAL  2.  Assess FGA/DGI and set goal Baseline: TBD Goal status: INITIAL  3.  Patient will score at least 24/30 on PSFS to signify clinically meaningful improvement in functional abilities.   Baseline: 19/30 Goal status: INITIAL  4.  Patient will increase 30s chair stand reps from 8 to 12 with/without arms to demonstrate and improved functional ability with less pain/difficulty as well as reduce fall risk.  Baseline: 8 Goal status: INITIAL     PLAN:  PT FREQUENCY: 1-2x/week  PT DURATION: 6 weeks  PLANNED INTERVENTIONS: 97110-Therapeutic exercises, 97530- Therapeutic activity, 97112- Neuromuscular re-education, 97535- Self Care, [id number]- Manual therapy, [phone]- Gait training, Patient/Family education, Balance training, and Stair training  PLAN FOR NEXT SESSION: HEP review and update, manual techniques as appropriate, aerobic tasks, ROM and flexibility activities, strengthening and PREs, TPDN, gait and balance training as needed     Reyes CHRISTELLA Kohut, PT 04/16/2024, 3:59 PM

## 2024-04-16 ENCOUNTER — Other Ambulatory Visit: Payer: Self-pay

## 2024-04-16 ENCOUNTER — Ambulatory Visit: Attending: Nurse Practitioner

## 2024-04-16 DIAGNOSIS — R2681 Unsteadiness on feet: Secondary | ICD-10-CM | POA: Diagnosis not present

## 2024-04-16 DIAGNOSIS — M6281 Muscle weakness (generalized): Secondary | ICD-10-CM | POA: Diagnosis not present

## 2024-04-16 DIAGNOSIS — R296 Repeated falls: Secondary | ICD-10-CM | POA: Insufficient documentation

## 2024-04-24 NOTE — Therapy (Unsigned)
 OUTPATIENT PHYSICAL THERAPY LOWER EXTREMITY EVALUATION   Patient Name: Cameron [name] MRN: [id number] DOB:Aug 09, 1951, 73 y.o., male Today's Date: 04/25/2024  END OF SESSION:  PT End of Session - 04/25/24 0915     Visit Number 2    Number of Visits 6    Date for PT Re-Evaluation 06/17/24    Authorization Type HTA    PT Start Time 0915    PT Stop Time 0955    PT Time Calculation (min) 40 min    Activity Tolerance Patient tolerated treatment well    Behavior During Therapy [facility] for tasks assessed/performed           Past Medical History:  Diagnosis Date   Adenomatous colon polyp 05/2009; 11/2014   2010:Tubular adenoma, no high grade dysplasia.SABRA  + Hyperplastic polyps. 2016: Tubular adenoma-rpt 5 yrs.   Cancer (HCC)    TURP for prostate cancer   COPD (chronic obstructive pulmonary disease) (HCC)    Coronary artery disease    Difficult intubation    H/O chronic gastritis 05/2009   EGD: no h.pylori,dysplasia,or evidence of malignancy   Hyperlipidemia    lovaza from prior PMD-stopped due to cost   Hypertension    Obesity, Class I, BMI 30.0-34.9 (see actual BMI) 04/23/2014   Tobacco dependence    Ventral hernia    small   Past Surgical History:  Procedure Laterality Date   APPENDECTOMY  09/28/1971   BIOPSY  08/15/2023   Procedure: BIOPSY;  Surgeon: Brenna Adine CROME, DO;  [facility]: [facility];  Service: Pulmonary;;   BLADDER DIVERTICULECTOMY N/A 09/30/2022   Procedure: BLADDER DIVERTICULECTOMY;  Surgeon: Renda Glance, MD;  Location: [facility];  Service: Urology;  Laterality: N/A;   CARPAL TUNNEL RELEASE Left    CHOLECYSTECTOMY N/A 07/06/2018   Procedure: LAPAROSCOPIC CHOLECYSTECTOMY WITH INTRAOPERATIVE CHOLANGIOGRAM;  Surgeon: Ethyl Lenis, MD;  Location: [facility] OR;  Service: General;  Laterality: N/A;   COLONOSCOPY  09/27/2008   [facility]   CYSTOSCOPY N/A 09/30/2022   Procedure: PHYLLIS SIDE;  Surgeon: Renda Glance, MD;  Location: [facility];  Service: Urology;  Laterality:  N/A;   EYE SURGERY  2018   Cataracts removed only   FINGER SURGERY Left 12/2021   Left middle finger, laceration   HEMOSTASIS CONTROL  08/15/2023   Procedure: HEMOSTASIS CONTROL;  Surgeon: Brenna Adine CROME, DO;  Location: [facility];  Service: Pulmonary;;   INTERCOSTAL NERVE BLOCK Left 10/03/2023   Procedure: INTERCOSTAL NERVE BLOCK;  Surgeon: Kerrin Elspeth BROCKS, MD;  Location: [facility] OR;  Service: Thoracic;  Laterality: Left;   IR IMAGING GUIDED PORT INSERTION  12/08/2023   LYMPH NODE BIOPSY Left 10/03/2023   Procedure: LYMPH NODE BIOPSY;  Surgeon: Kerrin Elspeth BROCKS, MD;  Location: [facility];  Service: Thoracic;  Laterality: Left;   ROBOT ASSISTED LAPAROSCOPIC RADICAL PROSTATECTOMY N/A 09/30/2022   Procedure: XI ROBOTIC ASSISTED LAPAROSCOPIC RADICAL PROSTATECTOMY LEVEL 3;  Surgeon: Renda Glance, MD;  Location: [facility];  Service: Urology;  Laterality: N/A;  270 MINUTES NEEDED FOR CASE   STRABISMUS SURGERY  age 69 or 9   TRANSURETHRAL RESECTION OF PROSTATE N/A 07/08/2022   Procedure: TRANSURETHRAL RESECTION OF THE PROSTATE (TURP)/ CYSTOSCOPY;  Surgeon: Renda Glance, MD;  Location: [facility];  Service: Urology;  Laterality: N/A;   VIDEO BRONCHOSCOPY N/A 08/15/2023   Procedure: VIDEO BRONCHOSCOPY WITHOUT FLUORO;  Surgeon: Brenna Adine CROME, DO;  Location: [facility];  Service: Pulmonary;  Laterality: N/A;   Patient Active Problem List   Diagnosis Date Noted  Hypokalemia 03/16/2024   Abnormal CBC 02/23/2024   SIRS (systemic inflammatory response syndrome) (HCC) 02/19/2024   Multiple falls 02/10/2024   Lower extremity edema 02/10/2024   Urinary retention 02/10/2024   Hospital discharge follow-up 02/10/2024   Syncope 02/02/2024   AKI (acute kidney injury) (HCC) 02/02/2024   Port-A-Cath in place 12/28/2023   Primary squamous cell carcinoma of bronchus of left upper lobe (HCC) 11/10/2023   S/P lobectomy of lung 10/03/2023   Lung nodule 08/15/2023   Acute respiratory infection 06/28/2023   Loss  of equilibrium 11/25/2022   Insomnia 11/25/2022   History of polycythemia 11/25/2022   Benign prostatic hyperplasia 07/08/2022   Acute cough 06/23/2022   Upper respiratory tract infection 06/23/2022   Preventative health care 05/04/2022   Abnormal hemoglobin (Hgb) (HCC) 04/08/2020   Need for hepatitis C screening test 04/26/2018   Centrilobular emphysema (HCC) 04/20/2018   Polycythemia 04/20/2018   CAD (coronary artery disease) 03/31/2018   Visit for preventive health examination 02/27/2018   Overweight (BMI 25.0-29.9) 04/23/2014   Tobacco dependence 12/29/2012   Health maintenance examination 03/13/2012   Hyperlipemia, mixed 03/13/2012   HTN (hypertension), benign 02/11/2012    PCP: Wendee Lynwood HERO, NP  REFERRING PROVIDER: Wendee Lynwood HERO, NP  REFERRING DIAG: R29.6 (ICD-10-CM) - Multiple falls  THERAPY DIAG:  Unsteadiness on feet  Muscle weakness (generalized)  Repeated falls  Rationale for Evaluation and Treatment: Rehabilitation  ONSET DATE: chronic  SUBJECTIVE:   SUBJECTIVE STATEMENT: No new issues or concerns   PERTINENT HISTORY: History of falls following treatment for lung CA and chemo earlier this year. PAIN:  Are you having pain? No  PRECAUTIONS: Fall  RED FLAGS: None   WEIGHT BEARING RESTRICTIONS: No  FALLS:  Has patient fallen in last 6 months? Yes. Number of falls 3  LIVING ENVIRONMENT: Lives with: lives with their family Lives in: House/apartment Stairs: yes Has following equipment at home: None  OCCUPATION: stage hand  PLOF: Independent  PATIENT GOALS: To prevent future falls  NEXT MD VISIT: TBD  OBJECTIVE:  Note: Objective measures were completed at Evaluation unless otherwise noted.  DIAGNOSTIC FINDINGS: none  PATIENT SURVEYS:   Patient-specific activity scoring scheme (Point to one number):  0 represents "unable to perform." 10 represents "able to perform at prior level. 0 1 2 3 4 5 6 7 8 9  10 (Date and Score) Activity  Initial  Activity Eval     Yard work 5     Walking 30 min  7    Arise from chair w/o arms 7    Total score = sum of the activity scores/number of activities Minimum detectable change (90%CI) for average score = 2 points Minimum detectable change (90%CI) for single activity score = 3 points PSFS developed by: Rosalee MYRTIS Marvis KYM Charlet HERO., & Binkley, J. (1995). Assessing disability and change on individual  patients: a report of a patient specific measure. Physiotherapy [place], 47, [phone]. Reproduced with the permission of the authors  Score: 19/30 63% perceived function    MUSCLE LENGTH: N/T  POSTURE: rounded shoulders and forward head  LOWER EXTREMITY ROM: WNL for gait and transfers  Active ROM Right eval Left eval  Hip flexion    Hip extension    Hip abduction    Hip adduction    Hip internal rotation    Hip external rotation    Knee flexion    Knee extension    Ankle dorsiflexion    Ankle plantarflexion    Ankle inversion  Ankle eversion     (Blank rows = not tested)  LOWER EXTREMITY MMT: See 30s chair stand test  MMT Right eval Left eval  Hip flexion    Hip extension    Hip abduction    Hip adduction    Hip internal rotation    Hip external rotation    Knee flexion    Knee extension    Ankle dorsiflexion    Ankle plantarflexion    Ankle inversion    Ankle eversion     (Blank rows = not tested)  FUNCTIONAL TESTS:  30 seconds chair stand test 8 reps arms crossed  mCTSIB 30s all 4 positions SLS R<15s, L 15s   GAIT: Distance walked: 12ftx2 Assistive device utilized: None Level of assistance: Complete Independence Comments: slow cadence                                                                                                                                TREATMENT:  [facility] Adult PT Treatment:                                                DATE: 04/25/24 Therapeutic Exercise: Nustep L4 8 min Neuromuscular re-ed: Supine hip  fallouts GTB 15x B, 15/15 unilaterally Bridge against GTB 15x Bridge against ball 15x Heel raise against wall 15x Toe raises against wall 15x Therapeutic Activity: Gastroc/soleus stretch 30s x2 each Seated hamstring stretch 30s x2 B Supine QL stretch 30s x2   [facility] Adult PT Treatment:                                                DATE: 04/16/24  Self Care: Additional minutes spent for educating on updated Therapeutic Home Exercise Program as well as comparing current status to condition at start of symptoms. This included exercises focusing on stretching, strengthening, with focus on eccentric aspects. Long term goals include an improvement in range of motion, strength, endurance as well as avoiding reinjury. Patient's frequency would include in 1-2 times a day, 3-5 times a week for a duration of 6-12 weeks. Proper technique shown and discussed handout in great detail. All questions were discussed and addressed.      PATIENT EDUCATION:  Education details: Discussed eval findings, rehab rationale and POC and patient is in agreement  Person educated: Patient Education method: Explanation and Handouts Education comprehension: verbalized understanding and needs further education  HOME EXERCISE PROGRAM: Access Code: [email or web address] URL: [email or web address] Date: 04/16/2024 Prepared by: Reyes Kohut  Exercises - Sit to Stand with Arms Crossed  - 1-2 x daily - 5 x weekly - 1 sets - 10 reps - Single Leg Stance  - 1-2  x daily - 5 x weekly - 1 sets - 20s hold  ASSESSMENT:  CLINICAL IMPRESSION:  First f/u session.  Focus of session was aerobic warm up f/b BLE stretching, strengthening of hips and ankles.  Able to tolerate all tasks w/o setback or aggravation of issues.  Patient is a 73 y.o. male who was seen today for physical therapy evaluation and treatment for frequent falls following treatment for lung CA. Patient recovering from radiation therapy.  LE ROM WFL, static balance  is good, 30s chair stand test finds mild LE strength deficits and SLS diminished on R.  Endurance deficits noted as patient had L upper lung lobe removed  OBJECTIVE IMPAIRMENTS: Abnormal gait, cardiopulmonary status limiting activity, decreased activity tolerance, decreased balance, decreased endurance, difficulty walking, and decreased strength.   ACTIVITY LIMITATIONS: carrying, lifting, standing, and stairs  PERSONAL FACTORS: Age and Fitness are also affecting patient's functional outcome.   REHAB POTENTIAL: Good  CLINICAL DECISION MAKING: Stable/uncomplicated  EVALUATION COMPLEXITY: Low   GOALS: Goals reviewed with patient? No    SHORT TERM GOALS=LONG TERM GOALS: Target date: 06/17/24  Patient to demonstrate independence in HEP  Baseline: [id number] Goal status: INITIAL  2.  Assess FGA/DGI and set goal Baseline: TBD Goal status: INITIAL  3.  Patient will score at least 24/30 on PSFS to signify clinically meaningful improvement in functional abilities.   Baseline: 19/30 Goal status: INITIAL  4.  Patient will increase 30s chair stand reps from 8 to 12 with/without arms to demonstrate and improved functional ability with less pain/difficulty as well as reduce fall risk.  Baseline: 8 Goal status: INITIAL     PLAN:  PT FREQUENCY: 1-2x/week  PT DURATION: 6 weeks  PLANNED INTERVENTIONS: 97110-Therapeutic exercises, 97530- Therapeutic activity, 97112- Neuromuscular re-education, 97535- Self Care, [id number]- Manual therapy, [phone]- Gait training, Patient/Family education, Balance training, and Stair training  PLAN FOR NEXT SESSION: HEP review and update, manual techniques as appropriate, aerobic tasks, ROM and flexibility activities, strengthening and PREs, TPDN, gait and balance training as needed     Reyes CHRISTELLA Kohut, PT 04/25/2024, 9:59 AM

## 2024-04-25 ENCOUNTER — Ambulatory Visit

## 2024-04-25 DIAGNOSIS — R2681 Unsteadiness on feet: Secondary | ICD-10-CM | POA: Diagnosis not present

## 2024-04-25 DIAGNOSIS — R296 Repeated falls: Secondary | ICD-10-CM

## 2024-04-25 DIAGNOSIS — M6281 Muscle weakness (generalized): Secondary | ICD-10-CM

## 2024-05-02 NOTE — Therapy (Unsigned)
 OUTPATIENT PHYSICAL THERAPY TREATMENT NOTE   Patient Name: Cameron [name] MRN: [id number] DOB:[birth year], 73 y.o., male Today's Date: 05/04/2024  END OF SESSION:  PT End of Session - 05/04/24 1256     Visit Number 3    Number of Visits 6    Date for PT Re-Evaluation 06/17/24    Authorization Type HTA    PT Start Time 1255    PT Stop Time 1335    PT Time Calculation (min) 40 min    Activity Tolerance Patient tolerated treatment well    Behavior During Therapy [facility] for tasks assessed/performed            Past Medical History:  Diagnosis Date   Adenomatous colon polyp 05/2009; 11/2014   2010:Tubular adenoma, no high grade dysplasia.SABRA  + Hyperplastic polyps. 2016: Tubular adenoma-rpt 5 yrs.   Cancer (HCC)    TURP for prostate cancer   COPD (chronic obstructive pulmonary disease) (HCC)    Coronary artery disease    Difficult intubation    H/O chronic gastritis 05/2009   EGD: no h.pylori,dysplasia,or evidence of malignancy   Hyperlipidemia    lovaza from prior PMD-stopped due to cost   Hypertension    Obesity, Class I, BMI 30.0-34.9 (see actual BMI) 04/23/2014   Tobacco dependence    Ventral hernia    small   Past Surgical History:  Procedure Laterality Date   APPENDECTOMY  09/28/1971   BIOPSY  08/15/2023   Procedure: BIOPSY;  Surgeon: Brenna Adine CROME, DO;  Location: [facility];  Service: Pulmonary;;   BLADDER DIVERTICULECTOMY N/A 09/30/2022   Procedure: BLADDER DIVERTICULECTOMY;  Surgeon: Renda Glance, MD;  Location: [facility];  Service: Urology;  Laterality: N/A;   CARPAL TUNNEL RELEASE Left    CHOLECYSTECTOMY N/A 07/06/2018   Procedure: LAPAROSCOPIC CHOLECYSTECTOMY WITH INTRAOPERATIVE CHOLANGIOGRAM;  Surgeon: Ethyl Lenis, MD;  Location: [facility];  Service: General;  Laterality: N/A;   COLONOSCOPY  09/27/2008   [facility]   CYSTOSCOPY N/A 09/30/2022   Procedure: PHYLLIS SIDE;  Surgeon: Renda Glance, MD;  Location: [facility];  Service: Urology;  Laterality: N/A;   EYE  SURGERY  2018   Cataracts removed only   FINGER SURGERY Left 12/2021   Left middle finger, laceration   HEMOSTASIS CONTROL  08/15/2023   Procedure: HEMOSTASIS CONTROL;  Surgeon: Brenna Adine CROME, DO;  Location: [facility];  Service: Pulmonary;;   INTERCOSTAL NERVE BLOCK Left 10/03/2023   Procedure: INTERCOSTAL NERVE BLOCK;  Surgeon: Kerrin Elspeth BROCKS, MD;  Location: [facility] OR;  Service: Thoracic;  Laterality: Left;   IR IMAGING GUIDED PORT INSERTION  12/08/2023   LYMPH NODE BIOPSY Left 10/03/2023   Procedure: LYMPH NODE BIOPSY;  Surgeon: Kerrin Elspeth BROCKS, MD;  Location: [facility] OR;  Service: Thoracic;  Laterality: Left;   ROBOT ASSISTED LAPAROSCOPIC RADICAL PROSTATECTOMY N/A 09/30/2022   Procedure: XI ROBOTIC ASSISTED LAPAROSCOPIC RADICAL PROSTATECTOMY LEVEL 3;  Surgeon: Renda Glance, MD;  Location: [facility];  Service: Urology;  Laterality: N/A;  270 MINUTES NEEDED FOR CASE   STRABISMUS SURGERY  age 27 or 9   TRANSURETHRAL RESECTION OF PROSTATE N/A 07/08/2022   Procedure: TRANSURETHRAL RESECTION OF THE PROSTATE (TURP)/ CYSTOSCOPY;  Surgeon: Renda Glance, MD;  Location: [facility];  Service: Urology;  Laterality: N/A;   VIDEO BRONCHOSCOPY N/A 08/15/2023   Procedure: VIDEO BRONCHOSCOPY WITHOUT FLUORO;  Surgeon: Brenna Adine CROME, DO;  Location: [facility];  Service: Pulmonary;  Laterality: N/A;   Patient Active Problem List   Diagnosis Date Noted  Hypokalemia 03/16/2024   Abnormal CBC 02/23/2024   SIRS (systemic inflammatory response syndrome) (HCC) 02/19/2024   Multiple falls 02/10/2024   Lower extremity edema 02/10/2024   Urinary retention 02/10/2024   Hospital discharge follow-up 02/10/2024   Syncope 02/02/2024   AKI (acute kidney injury) (HCC) 02/02/2024   Port-A-Cath in place 12/28/2023   Primary squamous cell carcinoma of bronchus of left upper lobe (HCC) 11/10/2023   S/P lobectomy of lung 10/03/2023   Lung nodule 08/15/2023   Acute respiratory infection 06/28/2023   Loss of  equilibrium 11/25/2022   Insomnia 11/25/2022   History of polycythemia 11/25/2022   Benign prostatic hyperplasia 07/08/2022   Acute cough 06/23/2022   Upper respiratory tract infection 06/23/2022   Preventative health care 05/04/2022   Abnormal hemoglobin (Hgb) (HCC) 04/08/2020   Need for hepatitis C screening test 04/26/2018   Centrilobular emphysema (HCC) 04/20/2018   Polycythemia 04/20/2018   CAD (coronary artery disease) 03/31/2018   Visit for preventive health examination 02/27/2018   Overweight (BMI 25.0-29.9) 04/23/2014   Tobacco dependence 12/29/2012   Health maintenance examination 03/13/2012   Hyperlipemia, mixed 03/13/2012   HTN (hypertension), benign 02/11/2012    PCP: Wendee Lynwood HERO, NP  REFERRING PROVIDER: Wendee Lynwood HERO, NP  REFERRING DIAG: R29.6 (ICD-10-CM) - Multiple falls  THERAPY DIAG:  Unsteadiness on feet  Muscle weakness (generalized)  Repeated falls  Rationale for Evaluation and Treatment: Rehabilitation  ONSET DATE: chronic  SUBJECTIVE:   SUBJECTIVE STATEMENT: No new c/o.    PERTINENT HISTORY: History of falls following treatment for lung CA and chemo earlier this year. PAIN:  Are you having pain? No  PRECAUTIONS: Fall  RED FLAGS: None   WEIGHT BEARING RESTRICTIONS: No  FALLS:  Has patient fallen in last 6 months? Yes. Number of falls 3  LIVING ENVIRONMENT: Lives with: lives with their family Lives in: House/apartment Stairs: yes Has following equipment at home: None  OCCUPATION: stage hand  PLOF: Independent  PATIENT GOALS: To prevent future falls  NEXT MD VISIT: TBD  OBJECTIVE:  Note: Objective measures were completed at Evaluation unless otherwise noted.  DIAGNOSTIC FINDINGS: none  PATIENT SURVEYS:   Patient-specific activity scoring scheme (Point to one number):  0 represents "unable to perform." 10 represents "able to perform at prior level. 0 1 2 3 4 5 6 7 8 9  10 (Date and Score) Activity  Initial  Activity Eval     Yard work 5     Walking 30 min  7    Arise from chair w/o arms 7    Total score = sum of the activity scores/number of activities Minimum detectable change (90%CI) for average score = 2 points Minimum detectable change (90%CI) for single activity score = 3 points PSFS developed by: Rosalee MYRTIS Marvis KYM Charlet HERO., & Binkley, J. (1995). Assessing disability and change on individual  patients: a report of a patient specific measure. Physiotherapy [place], 47, [phone]. Reproduced with the permission of the authors  Score: 19/30 63% perceived function    MUSCLE LENGTH: N/T  POSTURE: rounded shoulders and forward head  LOWER EXTREMITY ROM: WNL for gait and transfers  Active ROM Right eval Left eval  Hip flexion    Hip extension    Hip abduction    Hip adduction    Hip internal rotation    Hip external rotation    Knee flexion    Knee extension    Ankle dorsiflexion    Ankle plantarflexion    Ankle inversion  Ankle eversion     (Blank rows = not tested)  LOWER EXTREMITY MMT: See 30s chair stand test  MMT Right eval Left eval  Hip flexion    Hip extension    Hip abduction    Hip adduction    Hip internal rotation    Hip external rotation    Knee flexion    Knee extension    Ankle dorsiflexion    Ankle plantarflexion    Ankle inversion    Ankle eversion     (Blank rows = not tested)  FUNCTIONAL TESTS:  30 seconds chair stand test 8 reps arms crossed  mCTSIB 30s all 4 positions SLS R<15s, L 15s   GAIT: Distance walked: 51ftx2 Assistive device utilized: None Level of assistance: Complete Independence Comments: slow cadence                                                                                                                                TREATMENT:  [facility] Adult PT Treatment:                                                DATE: 05/04/24 Therapeutic Exercise: Nustep L5 8 min Neuromuscular re-ed: Supine hip  fallouts BluTB 15x B, 15/15 unilaterally Bridge against BluTB 15x Bridge against ball 15x STS from airex pad with 5000g ball 10x S/L clams BluTB 15/15 Therapeutic Activity: Gastroc/soleus stretch 30s x2 each Seated hamstring stretch 30s x2 B Supine QL stretch 30s x2  [facility] Adult PT Treatment:                                                DATE: 04/25/24 Therapeutic Exercise: Nustep L4 8 min Neuromuscular re-ed: Supine hip fallouts GTB 15x B, 15/15 unilaterally Bridge against GTB 15x Bridge against ball 15x Heel raise against wall 15x Toe raises against wall 15x Therapeutic Activity: Gastroc/soleus stretch 30s x2 each Seated hamstring stretch 30s x2 B Supine QL stretch 30s x2   [facility] Adult PT Treatment:                                                DATE: 04/16/24  Self Care: Additional minutes spent for educating on updated Therapeutic Home Exercise Program as well as comparing current status to condition at start of symptoms. This included exercises focusing on stretching, strengthening, with focus on eccentric aspects. Long term goals include an improvement in range of motion, strength, endurance as well as avoiding reinjury. Patient's frequency would include in 1-2 times a day, 3-5 times  a week for a duration of 6-12 weeks. Proper technique shown and discussed handout in great detail. All questions were discussed and addressed.      PATIENT EDUCATION:  Education details: Discussed eval findings, rehab rationale and POC and patient is in agreement  Person educated: Patient Education method: Explanation and Handouts Education comprehension: verbalized understanding and needs further education  HOME EXERCISE PROGRAM: Access Code: [email or web address] URL: [email or web address] Date: 04/16/2024 Prepared by: Reyes Kohut  Exercises - Sit to Stand with Arms Crossed  - 1-2 x daily - 5 x weekly - 1 sets - 10 reps - Single Leg Stance  - 1-2 x daily - 5 x weekly - 1 sets - 20s  hold  ASSESSMENT:  CLINICAL IMPRESSION:  Returns from a beach trip with no reports of limitations or pain.  Increased resistance and challenge of all tasks w/o reports of setback.  Added STS from compliant surface holding weight to challenge balance and body mechanics.  Patient is a 73 y.o. male who was seen today for physical therapy evaluation and treatment for frequent falls following treatment for lung CA. Patient recovering from radiation therapy.  LE ROM WFL, static balance is good, 30s chair stand test finds mild LE strength deficits and SLS diminished on R.  Endurance deficits noted as patient had L upper lung lobe removed  OBJECTIVE IMPAIRMENTS: Abnormal gait, cardiopulmonary status limiting activity, decreased activity tolerance, decreased balance, decreased endurance, difficulty walking, and decreased strength.   ACTIVITY LIMITATIONS: carrying, lifting, standing, and stairs  PERSONAL FACTORS: Age and Fitness are also affecting patient's functional outcome.   REHAB POTENTIAL: Good  CLINICAL DECISION MAKING: Stable/uncomplicated  EVALUATION COMPLEXITY: Low   GOALS: Goals reviewed with patient? No    SHORT TERM GOALS=LONG TERM GOALS: Target date: 06/17/24  Patient to demonstrate independence in HEP  Baseline: [email or web address] Goal status: INITIAL  2.  Assess FGA/DGI and set goal Baseline: TBD Goal status: INITIAL  3.  Patient will score at least 24/30 on PSFS to signify clinically meaningful improvement in functional abilities.   Baseline: 19/30 Goal status: INITIAL  4.  Patient will increase 30s chair stand reps from 8 to 12 with/without arms to demonstrate and improved functional ability with less pain/difficulty as well as reduce fall risk.  Baseline: 8 Goal status: INITIAL     PLAN:  PT FREQUENCY: 1-2x/week  PT DURATION: 6 weeks  PLANNED INTERVENTIONS: 97110-Therapeutic exercises, 97530- Therapeutic activity, 97112- Neuromuscular re-education, 97535- Self Care,  [id number]- Manual therapy, [id number]- Gait training, Patient/Family education, Balance training, and Stair training  PLAN FOR NEXT SESSION: HEP review and update, manual techniques as appropriate, aerobic tasks, ROM and flexibility activities, strengthening and PREs, TPDN, gait and balance training as needed     Reyes CHRISTELLA Kohut, PT 05/04/2024, 1:48 PM

## 2024-05-04 ENCOUNTER — Ambulatory Visit: Attending: Nurse Practitioner

## 2024-05-04 DIAGNOSIS — H35723 Serous detachment of retinal pigment epithelium, bilateral: Secondary | ICD-10-CM | POA: Diagnosis not present

## 2024-05-04 DIAGNOSIS — R2681 Unsteadiness on feet: Secondary | ICD-10-CM | POA: Diagnosis not present

## 2024-05-04 DIAGNOSIS — R296 Repeated falls: Secondary | ICD-10-CM | POA: Diagnosis not present

## 2024-05-04 DIAGNOSIS — H5315 Visual distortions of shape and size: Secondary | ICD-10-CM | POA: Diagnosis not present

## 2024-05-04 DIAGNOSIS — M6281 Muscle weakness (generalized): Secondary | ICD-10-CM | POA: Diagnosis not present

## 2024-05-04 DIAGNOSIS — H5310 Unspecified subjective visual disturbances: Secondary | ICD-10-CM | POA: Diagnosis not present

## 2024-05-07 ENCOUNTER — Encounter: Payer: Self-pay | Admitting: Pharmacist

## 2024-05-07 NOTE — Progress Notes (Signed)
 Pharmacy Quality Measure Review  This patient is appearing on a report for being at risk of failing the adherence measure for hypertension (ACEi/ARB) medications this calendar year.   Medication: benazapril Last fill date: 12/21/23 for 90 day supply  Insurance report was not up to date. No action needed at this time.   Patient no longer prescribed this medication.  Per discharge summary, benazepril  was stopped at discharge 02/05/24.  No further action needed at this time. Will continue to appear on adherence list.

## 2024-05-08 NOTE — Therapy (Signed)
 OUTPATIENT PHYSICAL THERAPY TREATMENT NOTE   Patient Name: Cameron [name] MRN: [id number] DOB:03-18-1951, 73 y.o., male Today's Date: 05/09/2024  END OF SESSION:  PT End of Session - 05/09/24 0915     Visit Number 4    Number of Visits 6    Date for PT Re-Evaluation 06/17/24    Authorization Type HTA    PT Start Time 0915    PT Stop Time 0955    PT Time Calculation (min) 40 min    Activity Tolerance Patient tolerated treatment well    Behavior During Therapy [facility] for tasks assessed/performed             Past Medical History:  Diagnosis Date   Adenomatous colon polyp 05/2009; 11/2014   2010:Tubular adenoma, no high grade dysplasia.SABRA  + Hyperplastic polyps. 2016: Tubular adenoma-rpt 5 yrs.   Cancer (HCC)    TURP for prostate cancer   COPD (chronic obstructive pulmonary disease) (HCC)    Coronary artery disease    Difficult intubation    H/O chronic gastritis 05/2009   EGD: no h.pylori,dysplasia,or evidence of malignancy   Hyperlipidemia    lovaza from prior PMD-stopped due to cost   Hypertension    Obesity, Class I, BMI 30.0-34.9 (see actual BMI) 04/23/2014   Tobacco dependence    Ventral hernia    small   Past Surgical History:  Procedure Laterality Date   APPENDECTOMY  09/28/1971   BIOPSY  08/15/2023   Procedure: BIOPSY;  Surgeon: Brenna Adine CROME, DO;  Location: [facility];  Service: Pulmonary;;   BLADDER DIVERTICULECTOMY N/A 09/30/2022   Procedure: BLADDER DIVERTICULECTOMY;  Surgeon: Renda Glance, MD;  Location: [facility];  Service: Urology;  Laterality: N/A;   CARPAL TUNNEL RELEASE Left    CHOLECYSTECTOMY N/A 07/06/2018   Procedure: LAPAROSCOPIC CHOLECYSTECTOMY WITH INTRAOPERATIVE CHOLANGIOGRAM;  Surgeon: Ethyl Lenis, MD;  Location: [facility];  Service: General;  Laterality: N/A;   COLONOSCOPY  09/27/2008   [facility]   CYSTOSCOPY N/A 09/30/2022   Procedure: PHYLLIS SIDE;  Surgeon: Renda Glance, MD;  Location: [facility];  Service: Urology;  Laterality: N/A;    EYE SURGERY  2018   Cataracts removed only   FINGER SURGERY Left 12/2021   Left middle finger, laceration   HEMOSTASIS CONTROL  08/15/2023   Procedure: HEMOSTASIS CONTROL;  Surgeon: Brenna Adine CROME, DO;  Location: [facility];  Service: Pulmonary;;   INTERCOSTAL NERVE BLOCK Left 10/03/2023   Procedure: INTERCOSTAL NERVE BLOCK;  Surgeon: Kerrin Elspeth BROCKS, MD;  Location: [facility] OR;  Service: Thoracic;  Laterality: Left;   IR IMAGING GUIDED PORT INSERTION  12/08/2023   LYMPH NODE BIOPSY Left 10/03/2023   Procedure: LYMPH NODE BIOPSY;  Surgeon: Kerrin Elspeth BROCKS, MD;  Location: [facility] OR;  Service: Thoracic;  Laterality: Left;   ROBOT ASSISTED LAPAROSCOPIC RADICAL PROSTATECTOMY N/A 09/30/2022   Procedure: XI ROBOTIC ASSISTED LAPAROSCOPIC RADICAL PROSTATECTOMY LEVEL 3;  Surgeon: Renda Glance, MD;  Location: [facility];  Service: Urology;  Laterality: N/A;  270 MINUTES NEEDED FOR CASE   STRABISMUS SURGERY  age 52 or 9   TRANSURETHRAL RESECTION OF PROSTATE N/A 07/08/2022   Procedure: TRANSURETHRAL RESECTION OF THE PROSTATE (TURP)/ CYSTOSCOPY;  Surgeon: Renda Glance, MD;  Location: [facility];  Service: Urology;  Laterality: N/A;   VIDEO BRONCHOSCOPY N/A 08/15/2023   Procedure: VIDEO BRONCHOSCOPY WITHOUT FLUORO;  Surgeon: Brenna Adine CROME, DO;  Location: [facility];  Service: Pulmonary;  Laterality: N/A;   Patient Active Problem List   Diagnosis Date Noted  Hypokalemia 03/16/2024   Abnormal CBC 02/23/2024   SIRS (systemic inflammatory response syndrome) (HCC) 02/19/2024   Multiple falls 02/10/2024   Lower extremity edema 02/10/2024   Urinary retention 02/10/2024   Hospital discharge follow-up 02/10/2024   Syncope 02/02/2024   AKI (acute kidney injury) (HCC) 02/02/2024   Port-A-Cath in place 12/28/2023   Primary squamous cell carcinoma of bronchus of left upper lobe (HCC) 11/10/2023   S/P lobectomy of lung 10/03/2023   Lung nodule 08/15/2023   Acute respiratory infection 06/28/2023   Loss of  equilibrium 11/25/2022   Insomnia 11/25/2022   History of polycythemia 11/25/2022   Benign prostatic hyperplasia 07/08/2022   Acute cough 06/23/2022   Upper respiratory tract infection 06/23/2022   Preventative health care 05/04/2022   Abnormal hemoglobin (Hgb) (HCC) 04/08/2020   Need for hepatitis C screening test 04/26/2018   Centrilobular emphysema (HCC) 04/20/2018   Polycythemia 04/20/2018   CAD (coronary artery disease) 03/31/2018   Visit for preventive health examination 02/27/2018   Overweight (BMI 25.0-29.9) 04/23/2014   Tobacco dependence 12/29/2012   Health maintenance examination 03/13/2012   Hyperlipemia, mixed 03/13/2012   HTN (hypertension), benign 02/11/2012    PCP: Wendee Lynwood HERO, NP  REFERRING PROVIDER: Wendee Lynwood HERO, NP  REFERRING DIAG: R29.6 (ICD-10-CM) - Multiple falls  THERAPY DIAG:  Unsteadiness on feet  Muscle weakness (generalized)  Repeated falls  Rationale for Evaluation and Treatment: Rehabilitation  ONSET DATE: chronic  SUBJECTIVE:   SUBJECTIVE STATEMENT: Continues to do well.  No falls or LOB reported.  PERTINENT HISTORY: History of falls following treatment for lung CA and chemo earlier this year. PAIN:  Are you having pain? No  PRECAUTIONS: Fall  RED FLAGS: None   WEIGHT BEARING RESTRICTIONS: No  FALLS:  Has patient fallen in last 6 months? Yes. Number of falls 3  LIVING ENVIRONMENT: Lives with: lives with their family Lives in: House/apartment Stairs: yes Has following equipment at home: None  OCCUPATION: stage hand  PLOF: Independent  PATIENT GOALS: To prevent future falls  NEXT MD VISIT: TBD  OBJECTIVE:  Note: Objective measures were completed at Evaluation unless otherwise noted.  DIAGNOSTIC FINDINGS: none  PATIENT SURVEYS:   Patient-specific activity scoring scheme (Point to one number):  0 represents "unable to perform." 10 represents "able to perform at prior level. 0 1 2 3 4 5 6 7 8 9  10 (Date  and Score) Activity Initial  Activity Eval     Yard work 5     Walking 30 min  7    Arise from chair w/o arms 7    Total score = sum of the activity scores/number of activities Minimum detectable change (90%CI) for average score = 2 points Minimum detectable change (90%CI) for single activity score = 3 points PSFS developed by: Rosalee MYRTIS Marvis KYM Charlet HERO., & Binkley, J. (1995). Assessing disability and change on individual  patients: a report of a patient specific measure. Physiotherapy [place], 47, [phone]. Reproduced with the permission of the authors  Score: 19/30 63% perceived function    MUSCLE LENGTH: N/T  POSTURE: rounded shoulders and forward head  LOWER EXTREMITY ROM: WNL for gait and transfers  Active ROM Right eval Left eval  Hip flexion    Hip extension    Hip abduction    Hip adduction    Hip internal rotation    Hip external rotation    Knee flexion    Knee extension    Ankle dorsiflexion    Ankle plantarflexion  Ankle inversion    Ankle eversion     (Blank rows = not tested)  LOWER EXTREMITY MMT: See 30s chair stand test  MMT Right eval Left eval  Hip flexion    Hip extension    Hip abduction    Hip adduction    Hip internal rotation    Hip external rotation    Knee flexion    Knee extension    Ankle dorsiflexion    Ankle plantarflexion    Ankle inversion    Ankle eversion     (Blank rows = not tested)  FUNCTIONAL TESTS:  30 seconds chair stand test 8 reps arms crossed  mCTSIB 30s all 4 positions SLS R<15s, L 15s   GAIT: Distance walked: 65ftx2 Assistive device utilized: None Level of assistance: Complete Independence Comments: slow cadence                                                                                                                                TREATMENT:  [facility] Adult PT Treatment:                                                DATE: 05/09/24 Therapeutic Exercise: Nustep L6 8 min Neuromuscular  re-ed: Supine hip fallouts BluTB 15x B, 15/15 unilaterally Bridge against BluTB 15x Bridge against ball 15x STS from airex pad with 5000g ball 10x S/L clams BluTB 15/15 Therapeutic Activity: Gastroc stretch 30s x2 each Seated hamstring stretch 30s x2 B Supine QL stretch 30s x2 Heel raises from 4 in step  Runners step from 6 in step  [facility] Adult PT Treatment:                                                DATE: 05/04/24 Therapeutic Exercise: Nustep L5 8 min Neuromuscular re-ed: Supine hip fallouts BluTB 15x B, 15/15 unilaterally Bridge against BluTB 15x Bridge against ball 15x STS from airex pad with 5000g ball 10x S/L clams BluTB 15/15 Therapeutic Activity: Gastroc/soleus stretch 30s x2 each Seated hamstring stretch 30s x2 B Supine QL stretch 30s x2  [facility] Adult PT Treatment:                                                DATE: 04/25/24 Therapeutic Exercise: Nustep L4 8 min Neuromuscular re-ed: Supine hip fallouts GTB 15x B, 15/15 unilaterally Bridge against GTB 15x Bridge against ball 15x Heel raise against wall 15x Toe raises against wall 15x Therapeutic Activity: Gastroc/soleus stretch 30s x2 each Seated hamstring stretch 30s x2 B  Supine QL stretch 30s x2   [facility] Adult PT Treatment:                                                DATE: 04/16/24  Self Care: Additional minutes spent for educating on updated Therapeutic Home Exercise Program as well as comparing current status to condition at start of symptoms. This included exercises focusing on stretching, strengthening, with focus on eccentric aspects. Long term goals include an improvement in range of motion, strength, endurance as well as avoiding reinjury. Patient's frequency would include in 1-2 times a day, 3-5 times a week for a duration of 6-12 weeks. Proper technique shown and discussed handout in great detail. All questions were discussed and addressed.      PATIENT EDUCATION:  Education details: Discussed eval  findings, rehab rationale and POC and patient is in agreement  Person educated: Patient Education method: Explanation and Handouts Education comprehension: verbalized understanding and needs further education  HOME EXERCISE PROGRAM: Access Code: [email or web address] URL: [email or web address] Date: 04/16/2024 Prepared by: Reyes Kohut  Exercises - Sit to Stand with Arms Crossed  - 1-2 x daily - 5 x weekly - 1 sets - 10 reps - Single Leg Stance  - 1-2 x daily - 5 x weekly - 1 sets - 20s hold  ASSESSMENT:  CLINICAL IMPRESSION:  No pain or LOB episodes reported.  Does not feel restricted with ADLS.  Advanced strength and balance tasks, including proprioceptive work w/o setback.  Re-assess at next visit for DC.  Patient is a 73 y.o. male who was seen today for physical therapy evaluation and treatment for frequent falls following treatment for lung CA. Patient recovering from radiation therapy.  LE ROM WFL, static balance is good, 30s chair stand test finds mild LE strength deficits and SLS diminished on R.  Endurance deficits noted as patient had L upper lung lobe removed  OBJECTIVE IMPAIRMENTS: Abnormal gait, cardiopulmonary status limiting activity, decreased activity tolerance, decreased balance, decreased endurance, difficulty walking, and decreased strength.   ACTIVITY LIMITATIONS: carrying, lifting, standing, and stairs  PERSONAL FACTORS: Age and Fitness are also affecting patient's functional outcome.   REHAB POTENTIAL: Good  CLINICAL DECISION MAKING: Stable/uncomplicated  EVALUATION COMPLEXITY: Low   GOALS: Goals reviewed with patient? No    SHORT TERM GOALS=LONG TERM GOALS: Target date: 06/17/24  Patient to demonstrate independence in HEP  Baseline: [email or web address] Goal status: INITIAL  2.  Assess FGA/DGI and set goal Baseline: TBD Goal status: INITIAL  3.  Patient will score at least 24/30 on PSFS to signify clinically meaningful improvement in functional  abilities.   Baseline: 19/30 Goal status: INITIAL  4.  Patient will increase 30s chair stand reps from 8 to 12 with/without arms to demonstrate and improved functional ability with less pain/difficulty as well as reduce fall risk.  Baseline: 8 Goal status: INITIAL     PLAN:  PT FREQUENCY: 1-2x/week  PT DURATION: 6 weeks  PLANNED INTERVENTIONS: 97110-Therapeutic exercises, 97530- Therapeutic activity, 97112- Neuromuscular re-education, 97535- Self Care, [id number]- Manual therapy, [phone]- Gait training, Patient/Family education, Balance training, and Stair training  PLAN FOR NEXT SESSION: HEP review and update, manual techniques as appropriate, aerobic tasks, ROM and flexibility activities, strengthening and PREs, TPDN, gait and balance training as needed     Reyes CHRISTELLA Kohut, PT 05/09/2024, 10:02 AM

## 2024-05-09 ENCOUNTER — Ambulatory Visit

## 2024-05-09 DIAGNOSIS — R2681 Unsteadiness on feet: Secondary | ICD-10-CM

## 2024-05-09 DIAGNOSIS — M6281 Muscle weakness (generalized): Secondary | ICD-10-CM

## 2024-05-09 DIAGNOSIS — R296 Repeated falls: Secondary | ICD-10-CM

## 2024-05-15 NOTE — Therapy (Unsigned)
 OUTPATIENT PHYSICAL THERAPY TREATMENT NOTE/DISCHARGE SUMMARY   Patient Name: Cameron [name] MRN: [id number] DOB:03/22/1951, 73 y.o., male Today's Date: 05/16/2024  END OF SESSION:  PT End of Session - 05/16/24 0917     Visit Number 5    Number of Visits 6    Date for PT Re-Evaluation 06/17/24    Authorization Type HTA    PT Start Time 0915    PT Stop Time 1000    PT Time Calculation (min) 45 min    Activity Tolerance Patient tolerated treatment well    Behavior During Therapy [facility] for tasks assessed/performed              Past Medical History:  Diagnosis Date   Adenomatous colon polyp 05/2009; 11/2014   2010:Tubular adenoma, no high grade dysplasia.SABRA  + Hyperplastic polyps. 2016: Tubular adenoma-rpt 5 yrs.   Cancer (HCC)    TURP for prostate cancer   COPD (chronic obstructive pulmonary disease) (HCC)    Coronary artery disease    Difficult intubation    H/O chronic gastritis 05/2009   EGD: no h.pylori,dysplasia,or evidence of malignancy   Hyperlipidemia    lovaza from prior PMD-stopped due to cost   Hypertension    Obesity, Class I, BMI 30.0-34.9 (see actual BMI) 04/23/2014   Tobacco dependence    Ventral hernia    small   Past Surgical History:  Procedure Laterality Date   APPENDECTOMY  09/28/1971   BIOPSY  08/15/2023   Procedure: BIOPSY;  Surgeon: Brenna Adine CROME, DO;  Location: [facility];  Service: Pulmonary;;   BLADDER DIVERTICULECTOMY N/A 09/30/2022   Procedure: BLADDER DIVERTICULECTOMY;  Surgeon: Renda Glance, MD;  Location: [facility];  Service: Urology;  Laterality: N/A;   CARPAL TUNNEL RELEASE Left    CHOLECYSTECTOMY N/A 07/06/2018   Procedure: LAPAROSCOPIC CHOLECYSTECTOMY WITH INTRAOPERATIVE CHOLANGIOGRAM;  Surgeon: Ethyl Lenis, MD;  Location: [facility] OR;  Service: General;  Laterality: N/A;   COLONOSCOPY  09/27/2008   [facility]   CYSTOSCOPY N/A 09/30/2022   Procedure: PHYLLIS SIDE;  Surgeon: Renda Glance, MD;  Location: [facility];  Service: Urology;   Laterality: N/A;   EYE SURGERY  2018   Cataracts removed only   FINGER SURGERY Left 12/2021   Left middle finger, laceration   HEMOSTASIS CONTROL  08/15/2023   Procedure: HEMOSTASIS CONTROL;  Surgeon: Brenna Adine CROME, DO;  Location: [facility];  Service: Pulmonary;;   INTERCOSTAL NERVE BLOCK Left 10/03/2023   Procedure: INTERCOSTAL NERVE BLOCK;  Surgeon: Kerrin Elspeth BROCKS, MD;  Location: [facility] OR;  Service: Thoracic;  Laterality: Left;   IR IMAGING GUIDED PORT INSERTION  12/08/2023   LYMPH NODE BIOPSY Left 10/03/2023   Procedure: LYMPH NODE BIOPSY;  Surgeon: Kerrin Elspeth BROCKS, MD;  Location: [facility] OR;  Service: Thoracic;  Laterality: Left;   ROBOT ASSISTED LAPAROSCOPIC RADICAL PROSTATECTOMY N/A 09/30/2022   Procedure: XI ROBOTIC ASSISTED LAPAROSCOPIC RADICAL PROSTATECTOMY LEVEL 3;  Surgeon: Renda Glance, MD;  Location: [facility];  Service: Urology;  Laterality: N/A;  270 MINUTES NEEDED FOR CASE   STRABISMUS SURGERY  age 62 or 9   TRANSURETHRAL RESECTION OF PROSTATE N/A 07/08/2022   Procedure: TRANSURETHRAL RESECTION OF THE PROSTATE (TURP)/ CYSTOSCOPY;  Surgeon: Renda Glance, MD;  Location: [facility];  Service: Urology;  Laterality: N/A;   VIDEO BRONCHOSCOPY N/A 08/15/2023   Procedure: VIDEO BRONCHOSCOPY WITHOUT FLUORO;  Surgeon: Brenna Adine CROME, DO;  Location: [facility];  Service: Pulmonary;  Laterality: N/A;   Patient Active Problem List   Diagnosis  Date Noted   Hypokalemia 03/16/2024   Abnormal CBC 02/23/2024   SIRS (systemic inflammatory response syndrome) (HCC) 02/19/2024   Multiple falls 02/10/2024   Lower extremity edema 02/10/2024   Urinary retention 02/10/2024   Hospital discharge follow-up 02/10/2024   Syncope 02/02/2024   AKI (acute kidney injury) (HCC) 02/02/2024   Port-A-Cath in place 12/28/2023   Primary squamous cell carcinoma of bronchus of left upper lobe (HCC) 11/10/2023   S/P lobectomy of lung 10/03/2023   Lung nodule 08/15/2023   Acute respiratory infection  06/28/2023   Loss of equilibrium 11/25/2022   Insomnia 11/25/2022   History of polycythemia 11/25/2022   Benign prostatic hyperplasia 07/08/2022   Acute cough 06/23/2022   Upper respiratory tract infection 06/23/2022   Preventative health care 05/04/2022   Abnormal hemoglobin (Hgb) (HCC) 04/08/2020   Need for hepatitis C screening test 04/26/2018   Centrilobular emphysema (HCC) 04/20/2018   Polycythemia 04/20/2018   CAD (coronary artery disease) 03/31/2018   Visit for preventive health examination 02/27/2018   Overweight (BMI 25.0-29.9) 04/23/2014   Tobacco dependence 12/29/2012   Health maintenance examination 03/13/2012   Hyperlipemia, mixed 03/13/2012   HTN (hypertension), benign 02/11/2012    PCP: Wendee Lynwood HERO, NP  REFERRING PROVIDER: Wendee Lynwood HERO, NP  REFERRING DIAG: R29.6 (ICD-10-CM) - Multiple falls  THERAPY DIAG:  Unsteadiness on feet  Muscle weakness (generalized)  Repeated falls  Rationale for Evaluation and Treatment: Rehabilitation  ONSET DATE: chronic  SUBJECTIVE:   SUBJECTIVE STATEMENT: No limitation reported, no falls or LOB noted.  PERTINENT HISTORY: History of falls following treatment for lung CA and chemo earlier this year. PAIN:  Are you having pain? No  PRECAUTIONS: Fall  RED FLAGS: None   WEIGHT BEARING RESTRICTIONS: No  FALLS:  Has patient fallen in last 6 months? Yes. Number of falls 3  LIVING ENVIRONMENT: Lives with: lives with their family Lives in: House/apartment Stairs: yes Has following equipment at home: None  OCCUPATION: stage hand  PLOF: Independent  PATIENT GOALS: To prevent future falls  NEXT MD VISIT: TBD  OBJECTIVE:  Note: Objective measures were completed at Evaluation unless otherwise noted.  DIAGNOSTIC FINDINGS: none  PATIENT SURVEYS:   Patient-specific activity scoring scheme (Point to one number):  0 represents "unable to perform." 10 represents "able to perform at prior level. 0 1 2 3 4 5  6 7 8 9 10  (Date and Score) Activity Initial  Activity Eval   05/16/24  Yard work 5   8.5  Walking 30 min  7  9  Arise from chair w/o arms 7 10   Total score = sum of the activity scores/number of activities Minimum detectable change (90%CI) for average score = 2 points Minimum detectable change (90%CI) for single activity score = 3 points PSFS developed by: Rosalee MYRTIS Marvis KYM Charlet HERO., & Binkley, J. (1995). Assessing disability and change on individual  patients: a report of a patient specific measure. Physiotherapy [place], 47, [phone]. Reproduced with the permission of the authors  Score: 19/30 63% perceived function; 05/16/24 27.5/30 92% perceived function    MUSCLE LENGTH: N/T  POSTURE: rounded shoulders and forward head  LOWER EXTREMITY ROM: WNL for gait and transfers  Active ROM Right eval Left eval  Hip flexion    Hip extension    Hip abduction    Hip adduction    Hip internal rotation    Hip external rotation    Knee flexion    Knee extension    Ankle  dorsiflexion    Ankle plantarflexion    Ankle inversion    Ankle eversion     (Blank rows = not tested)  LOWER EXTREMITY MMT: See 30s chair stand test  MMT Right eval Left eval  Hip flexion    Hip extension    Hip abduction    Hip adduction    Hip internal rotation    Hip external rotation    Knee flexion    Knee extension    Ankle dorsiflexion    Ankle plantarflexion    Ankle inversion    Ankle eversion     (Blank rows = not tested)  FUNCTIONAL TESTS:  30 seconds chair stand test 8 reps arms crossed; 05/16/24 10 reps mCTSIB 30s all 4 positions SLS R<15s, L 15s   GAIT: Distance walked: 22ftx2 Assistive device utilized: None Level of assistance: Complete Independence Comments: slow cadence                                                                                                                                TREATMENT:  [facility] Adult PT Treatment:                                                 DATE: 05/16/24 Therapeutic Exercise: Nustep L7 8 min  Neuromuscular re-ed:   05/16/24 0001  Dynamic Gait Index  Level Surface 3  Change in Gait Speed 3  Gait with Horizontal Head Turns 3  Gait with Vertical Head Turns 3  Gait and Pivot Turn 3  Step Over Obstacle 3  Step Around Obstacles 3  Steps 3  Total Score 24   Therapeutic Activity: PSFS retake  [facility] Adult PT Treatment:                                                DATE: 05/09/24 Therapeutic Exercise: Nustep L6 8 min Neuromuscular re-ed: Supine hip fallouts BluTB 15x B, 15/15 unilaterally Bridge against BluTB 15x Bridge against ball 15x STS from airex pad with 5000g ball 10x S/L clams BluTB 15/15 Therapeutic Activity: Gastroc stretch 30s x2 each Seated hamstring stretch 30s x2 B Supine QL stretch 30s x2 Heel raises from 4 in step  Runners step from 6 in step  [facility] Adult PT Treatment:                                                DATE: 05/04/24 Therapeutic Exercise: Nustep L5 8 min Neuromuscular re-ed: Supine hip fallouts BluTB 15x B, 15/15 unilaterally Bridge against BluTB 15x  Bridge against ball 15x STS from airex pad with 5000g ball 10x S/L clams BluTB 15/15 Therapeutic Activity: Gastroc/soleus stretch 30s x2 each Seated hamstring stretch 30s x2 B Supine QL stretch 30s x2  [facility] Adult PT Treatment:                                                DATE: 04/25/24 Therapeutic Exercise: Nustep L4 8 min Neuromuscular re-ed: Supine hip fallouts GTB 15x B, 15/15 unilaterally Bridge against GTB 15x Bridge against ball 15x Heel raise against wall 15x Toe raises against wall 15x Therapeutic Activity: Gastroc/soleus stretch 30s x2 each Seated hamstring stretch 30s x2 B Supine QL stretch 30s x2   [facility] Adult PT Treatment:                                                DATE: 04/16/24  Self Care: Additional minutes spent for educating on updated Therapeutic Home Exercise Program as well as  comparing current status to condition at start of symptoms. This included exercises focusing on stretching, strengthening, with focus on eccentric aspects. Long term goals include an improvement in range of motion, strength, endurance as well as avoiding reinjury. Patient's frequency would include in 1-2 times a day, 3-5 times a week for a duration of 6-12 weeks. Proper technique shown and discussed handout in great detail. All questions were discussed and addressed.      PATIENT EDUCATION:  Education details: Discussed eval findings, rehab rationale and POC and patient is in agreement  Person educated: Patient Education method: Explanation and Handouts Education comprehension: verbalized understanding and needs further education  HOME EXERCISE PROGRAM: Access Code: [email or web address] URL: [email or web address] Date: 05/16/2024 Prepared by: Reyes Kohut  Exercises - Sit to Stand with Arms Crossed  - 1-2 x daily - 1 sets - 10 reps - Hooklying Single Leg Bent Knee Fallouts with Resistance  - 1-2 x daily - 3 x weekly - 1 sets - 15 reps - Standing Heel Raise with Support  - 1-2 x daily - 3 x weekly - 1 sets - 15 reps - Toe Raise With Back Against Wall  - 1-2 x daily - 3 x weekly - 1 sets - 15 reps - Supine Bridge with Resistance Band  - 1-2 x daily - 5 x weekly - 1 sets - 15 reps  ASSESSMENT:  CLINICAL IMPRESSION:  All rehab goals met, no strength or balance deficits identified.  Patient is a 73 y.o. male who was seen today for physical therapy evaluation and treatment for frequent falls following treatment for lung CA. Patient recovering from radiation therapy.  LE ROM WFL, static balance is good, 30s chair stand test finds mild LE strength deficits and SLS diminished on R.  Endurance deficits noted as patient had L upper lung lobe removed  OBJECTIVE IMPAIRMENTS: Abnormal gait, cardiopulmonary status limiting activity, decreased activity tolerance, decreased balance, decreased  endurance, difficulty walking, and decreased strength.   ACTIVITY LIMITATIONS: carrying, lifting, standing, and stairs  PERSONAL FACTORS: Age and Fitness are also affecting patient's functional outcome.   REHAB POTENTIAL: Good  CLINICAL DECISION MAKING: Stable/uncomplicated  EVALUATION COMPLEXITY: Low   GOALS: Goals reviewed with patient? No    SHORT TERM GOALS=LONG TERM GOALS:  Target date: 06/17/24  Patient to demonstrate independence in HEP  Baseline: [email or web address] Goal status: Met  2.  Assess FGA/DGI and set goal Baseline: TBD; 05/16/24 24/24 Goal status: Met  3.  Patient will score at least 24/30 on PSFS to signify clinically meaningful improvement in functional abilities.   Baseline: 19/30; 05/16/24 27.5/30 Goal status: Met  4.  Patient will increase 30s chair stand reps from 8 to 12 with/without arms to demonstrate and improved functional ability with less pain/difficulty as well as reduce fall risk.  Baseline: 8; 05/16/24 10 reps  Goal status: Partially met     PLAN:  PT FREQUENCY: 1-2x/week  PT DURATION: 6 weeks  PLANNED INTERVENTIONS: 97110-Therapeutic exercises, 97530- Therapeutic activity, 97112- Neuromuscular re-education, 97535- Self Care, [id number]- Manual therapy, [phone]- Gait training, Patient/Family education, Balance training, and Stair training  PLAN FOR NEXT SESSION: HEP review and update, manual techniques as appropriate, aerobic tasks, ROM and flexibility activities, strengthening and PREs, TPDN, gait and balance training as needed     Reyes CHRISTELLA Kohut, PT 05/16/2024, 9:59 AM

## 2024-05-16 ENCOUNTER — Ambulatory Visit

## 2024-05-16 DIAGNOSIS — M6281 Muscle weakness (generalized): Secondary | ICD-10-CM

## 2024-05-16 DIAGNOSIS — R2681 Unsteadiness on feet: Secondary | ICD-10-CM | POA: Diagnosis not present

## 2024-05-16 DIAGNOSIS — R296 Repeated falls: Secondary | ICD-10-CM

## 2024-05-21 DIAGNOSIS — B079 Viral wart, unspecified: Secondary | ICD-10-CM | POA: Diagnosis not present

## 2024-05-21 DIAGNOSIS — L821 Other seborrheic keratosis: Secondary | ICD-10-CM | POA: Diagnosis not present

## 2024-05-21 DIAGNOSIS — D225 Melanocytic nevi of trunk: Secondary | ICD-10-CM | POA: Diagnosis not present

## 2024-05-21 DIAGNOSIS — Z85828 Personal history of other malignant neoplasm of skin: Secondary | ICD-10-CM | POA: Diagnosis not present

## 2024-05-21 DIAGNOSIS — D485 Neoplasm of uncertain behavior of skin: Secondary | ICD-10-CM | POA: Diagnosis not present

## 2024-05-21 DIAGNOSIS — D692 Other nonthrombocytopenic purpura: Secondary | ICD-10-CM | POA: Diagnosis not present

## 2024-05-21 DIAGNOSIS — L720 Epidermal cyst: Secondary | ICD-10-CM | POA: Diagnosis not present

## 2024-05-22 ENCOUNTER — Telehealth: Payer: Self-pay | Admitting: Medical Oncology

## 2024-05-22 NOTE — Telephone Encounter (Signed)
 Pt thought he was getting a PET scan tomorrow and not a CT scan. I told him he has a CT scan scheduled tomorrow.

## 2024-05-23 ENCOUNTER — Encounter (HOSPITAL_COMMUNITY): Payer: Self-pay

## 2024-05-23 ENCOUNTER — Ambulatory Visit (HOSPITAL_COMMUNITY)
Admission: RE | Admit: 2024-05-23 | Discharge: 2024-05-23 | Disposition: A | Discharge status: Home / Self Care | Source: Ambulatory Visit | Attending: Internal Medicine | Admitting: Internal Medicine

## 2024-05-23 ENCOUNTER — Inpatient Hospital Stay: Attending: Internal Medicine

## 2024-05-23 DIAGNOSIS — Z79899 Other long term (current) drug therapy: Secondary | ICD-10-CM | POA: Diagnosis not present

## 2024-05-23 DIAGNOSIS — F17210 Nicotine dependence, cigarettes, uncomplicated: Secondary | ICD-10-CM | POA: Diagnosis not present

## 2024-05-23 DIAGNOSIS — C3412 Malignant neoplasm of upper lobe, left bronchus or lung: Secondary | ICD-10-CM | POA: Insufficient documentation

## 2024-05-23 DIAGNOSIS — I700 Atherosclerosis of aorta: Secondary | ICD-10-CM | POA: Diagnosis not present

## 2024-05-23 DIAGNOSIS — C3490 Malignant neoplasm of unspecified part of unspecified bronchus or lung: Secondary | ICD-10-CM

## 2024-05-23 DIAGNOSIS — J90 Pleural effusion, not elsewhere classified: Secondary | ICD-10-CM | POA: Diagnosis not present

## 2024-05-23 LAB — CBC WITH DIFFERENTIAL (CANCER CENTER ONLY)
Abs Immature Granulocytes: 0.02 K/uL (ref 0.00–0.07)
Basophils Absolute: 0 K/uL (ref 0.0–0.1)
Basophils Relative: 1 %
Eosinophils Absolute: 0.2 K/uL (ref 0.0–0.5)
Eosinophils Relative: 3 %
HCT: 41 % (ref 39.0–52.0)
Hemoglobin: 14 g/dL (ref 13.0–17.0)
Immature Granulocytes: 0 %
Lymphocytes Relative: 27 %
Lymphs Abs: 1.7 K/uL (ref 0.7–4.0)
MCH: 31.2 pg (ref 26.0–34.0)
MCHC: 34.1 g/dL (ref 30.0–36.0)
MCV: 91.3 fL (ref 80.0–100.0)
Monocytes Absolute: 0.5 K/uL (ref 0.1–1.0)
Monocytes Relative: 8 %
Neutro Abs: 3.9 K/uL (ref 1.7–7.7)
Neutrophils Relative %: 61 %
Platelet Count: 227 K/uL (ref 150–400)
RBC: 4.49 MIL/uL (ref 4.22–5.81)
RDW: 13.2 % (ref 11.5–15.5)
WBC Count: 6.3 K/uL (ref 4.0–10.5)
nRBC: 0 % (ref 0.0–0.2)

## 2024-05-23 LAB — CMP (CANCER CENTER ONLY)
ALT: 12 U/L (ref 0–44)
AST: 16 U/L (ref 15–41)
Albumin: 4.1 g/dL (ref 3.5–5.0)
Alkaline Phosphatase: 80 U/L (ref 38–126)
Anion gap: 6 (ref 5–15)
BUN: 19 mg/dL (ref 8–23)
CO2: 31 mmol/L (ref 22–32)
Calcium: 9.5 mg/dL (ref 8.9–10.3)
Chloride: 101 mmol/L (ref 98–111)
Creatinine: 1.22 mg/dL (ref 0.61–1.24)
GFR, Estimated: >60 mL/min (ref >60)
Glucose, Bld: 135 mg/dL — ABNORMAL HIGH (ref 70–99)
Potassium: 3.9 mmol/L (ref 3.5–5.1)
Sodium: 138 mmol/L (ref 135–145)
Total Bilirubin: 0.5 mg/dL (ref 0.0–1.2)
Total Protein: 6.6 g/dL (ref 6.5–8.1)

## 2024-05-23 LAB — MAGNESIUM: Magnesium: 2 mg/dL (ref 1.7–2.4)

## 2024-05-23 MED ORDER — IOHEXOL 300 MG/ML  SOLN
75.0000 mL | Freq: Once | INTRAMUSCULAR | Status: AC | PRN
  Administered 2024-05-23: 75 mL via INTRAVENOUS

## 2024-05-23 MED ORDER — HEPARIN SOD (PORK) LOCK FLUSH 100 UNIT/ML IV SOLN
500.0000 [IU] | Freq: Once | INTRAVENOUS | Status: AC
  Administered 2024-05-23: 500 [IU] via INTRAVENOUS

## 2024-05-29 ENCOUNTER — Inpatient Hospital Stay: Attending: Internal Medicine | Admitting: Internal Medicine

## 2024-05-29 VITALS — BP 138/75 | HR 66 | Temp 98.2°F | Resp 17 | Ht 69.5 in | Wt 187.0 lb

## 2024-05-29 DIAGNOSIS — F17210 Nicotine dependence, cigarettes, uncomplicated: Secondary | ICD-10-CM | POA: Insufficient documentation

## 2024-05-29 DIAGNOSIS — C3490 Malignant neoplasm of unspecified part of unspecified bronchus or lung: Secondary | ICD-10-CM | POA: Diagnosis not present

## 2024-05-29 DIAGNOSIS — C3412 Malignant neoplasm of upper lobe, left bronchus or lung: Secondary | ICD-10-CM | POA: Insufficient documentation

## 2024-05-29 NOTE — Addendum Note (Signed)
 Addended by: CAROLEE LOA DEL on: 05/29/2024 09:27 AM   Modules accepted: Orders

## 2024-05-29 NOTE — Progress Notes (Signed)
 [facility] [phone]   [phone]  OFFICE PROGRESS NOTE  Wendee Lynwood HERO, NP [address] [place] [address]  DIAGNOSIS: Stage IIA (T1c, N1, M0) Squamous cell Carcinoma diagnosed in January of 2025. Diagnosed with NSCLC, squamous cell carcinoma, stage IIB due to lymph node involvement and tumor size (2.9 cm).  PD-L1 TPS was 0%  PRIOR THERAPY:  1) Status post left upper lobectomy with lymph node dissection on October 03, 2023.  2) Systemic chemotherapy with cisplatin  75 Mg/M2 and docetaxel  75 Mg/M2 with Neulasta  support every 3 weeks.  First dose November 16, 2023.  Status post 4 cycles  CURRENT THERAPY: Observation.  INTERVAL HISTORY: Cameron [name] 73 y.o. male returns to the clinic today for follow-up visit accompanied by his wife. Discussed the use of AI scribe software for clinical note transcription with the patient, who gave verbal consent to proceed.  History of Present Illness Cameron [name] is a 73 year old male with stage 2A non-small cell lung cancer who presents for evaluation with a repeat CT scan of the chest. He is accompanied by his wife.  He was diagnosed with stage 2A non-small cell lung cancer, specifically squamous cell carcinoma, in January 2025. He underwent a left upper lobectomy with lymph node dissection, followed by four cycles of adjuvant systemic chemotherapy with cisplatin  and docetaxel . He is currently under observation and is here for a follow-up evaluation with a repeat CT scan of the chest.  He has been feeling better since May and has been engaging in activities around the house. He plans to go on a vacation to the beach next week. No current complaints, including chest pain, breathing issues, nausea, vomiting, diarrhea, or headaches.  His current medications include gabapentin , which he takes once daily in the morning, and a men's one-a-day vitamin with vitamin C. He has stopped taking other medications as  his magnesium  levels are stable.     MEDICAL HISTORY: Past Medical History:  Diagnosis Date   Adenomatous colon polyp 05/2009; 11/2014   2010:Tubular adenoma, no high grade dysplasia.SABRA  + Hyperplastic polyps. 2016: Tubular adenoma-rpt 5 yrs.   Cancer (HCC)    TURP for prostate cancer   COPD (chronic obstructive pulmonary disease) (HCC)    Coronary artery disease    Difficult intubation    H/O chronic gastritis 05/2009   EGD: no h.pylori,dysplasia,or evidence of malignancy   Hyperlipidemia    lovaza from prior PMD-stopped due to cost   Hypertension    Obesity, Class I, BMI 30.0-34.9 (see actual BMI) 04/23/2014   Tobacco dependence    Ventral hernia    small    ALLERGIES:  is allergic to thorazine  [chlorpromazine ].  MEDICATIONS:  Current Outpatient Medications  Medication Sig Dispense Refill   atorvastatin  (LIPITOR) 20 MG tablet Take 1 tablet (20 mg total) by mouth daily. 90 tablet 1   fluticasone  (FLONASE ) 50 MCG/ACT nasal spray Place 1 spray into both nostrils 2 (two) times daily as needed for allergies or rhinitis.     fluticasone -salmeterol (ADVAIR  DISKUS) 250-50 MCG/ACT AEPB Inhale 1 puff into the lungs in the morning and at bedtime. 180 each 0   gabapentin  (NEURONTIN ) 300 MG capsule Take 1 capsule (300 mg total) by mouth 2 (two) times daily. (Patient taking differently: Take 300 mg by mouth in the morning and at bedtime.) 90 capsule 2   hydrochlorothiazide  (MICROZIDE ) 12.5 MG capsule Take 12.5 mg by mouth daily.  hydrochlorothiazide  (MICROZIDE ) 12.5 MG capsule Take 1 capsule (12.5 mg total) by mouth daily. 90 capsule 1   hydrocortisone  ointment 0.5 % Apply 1 Application topically 2 (two) times daily. 30 g 0   lidocaine -prilocaine  (EMLA ) cream Apply to the Port-A-Cath site 30-60 minutes before chemotherapy (Patient taking differently: Apply 1 Application topically as needed (for port access).) 30 g 0   magnesium  oxide (MAG-OX) 400 (240 Mg) MG tablet Take 400 mg by mouth 3  (three) times daily.     Misc. Devices (PRECISION CATHETER URINE SYS) KIT 1 Box by Does not apply route 3 (three) times daily. 1 kit 2   pantoprazole  (PROTONIX ) 20 MG tablet Take 1 tablet (20 mg total) by mouth daily. 30 tablet 0   valACYclovir  (VALTREX ) 1000 MG tablet Take 1 tablet (1,000 mg total) by mouth 2 (two) times daily for 3 days. Start within 72 hours of the first signs of a flare. (Patient taking differently: Take 500 mg by mouth daily.) 18 tablet 2   No current facility-administered medications for this visit.    SURGICAL HISTORY:  Past Surgical History:  Procedure Laterality Date   APPENDECTOMY  09/28/1971   BIOPSY  08/15/2023   Procedure: BIOPSY;  Surgeon: Brenna Adine CROME, DO;  Location: [facility];  Service: Pulmonary;;   BLADDER DIVERTICULECTOMY N/A 09/30/2022   Procedure: BLADDER DIVERTICULECTOMY;  Surgeon: Renda Glance, MD;  Location: [facility];  Service: Urology;  Laterality: N/A;   CARPAL TUNNEL RELEASE Left    CHOLECYSTECTOMY N/A 07/06/2018   Procedure: LAPAROSCOPIC CHOLECYSTECTOMY WITH INTRAOPERATIVE CHOLANGIOGRAM;  Surgeon: Ethyl Lenis, MD;  Location: [facility] OR;  Service: General;  Laterality: N/A;   COLONOSCOPY  09/27/2008   [facility]   CYSTOSCOPY N/A 09/30/2022   Procedure: PHYLLIS SIDE;  Surgeon: Renda Glance, MD;  Location: [facility];  Service: Urology;  Laterality: N/A;   EYE SURGERY  2018   Cataracts removed only   FINGER SURGERY Left 12/2021   Left middle finger, laceration   HEMOSTASIS CONTROL  08/15/2023   Procedure: HEMOSTASIS CONTROL;  Surgeon: Brenna Adine CROME, DO;  Location: [facility];  Service: Pulmonary;;   INTERCOSTAL NERVE BLOCK Left 10/03/2023   Procedure: INTERCOSTAL NERVE BLOCK;  Surgeon: Kerrin Elspeth BROCKS, MD;  Location: [facility] OR;  Service: Thoracic;  Laterality: Left;   IR IMAGING GUIDED PORT INSERTION  12/08/2023   LYMPH NODE BIOPSY Left 10/03/2023   Procedure: LYMPH NODE BIOPSY;  Surgeon: Kerrin Elspeth BROCKS, MD;  Location: [facility] OR;   Service: Thoracic;  Laterality: Left;   ROBOT ASSISTED LAPAROSCOPIC RADICAL PROSTATECTOMY N/A 09/30/2022   Procedure: XI ROBOTIC ASSISTED LAPAROSCOPIC RADICAL PROSTATECTOMY LEVEL 3;  Surgeon: Renda Glance, MD;  Location: [facility];  Service: Urology;  Laterality: N/A;  270 MINUTES NEEDED FOR CASE   STRABISMUS SURGERY  age 58 or 9   TRANSURETHRAL RESECTION OF PROSTATE N/A 07/08/2022   Procedure: TRANSURETHRAL RESECTION OF THE PROSTATE (TURP)/ CYSTOSCOPY;  Surgeon: Renda Glance, MD;  Location: [facility];  Service: Urology;  Laterality: N/A;   VIDEO BRONCHOSCOPY N/A 08/15/2023   Procedure: VIDEO BRONCHOSCOPY WITHOUT FLUORO;  Surgeon: Brenna Adine CROME, DO;  Location: [facility];  Service: Pulmonary;  Laterality: N/A;    REVIEW OF SYSTEMS:  A comprehensive review of systems was negative.   PHYSICAL EXAMINATION: General appearance: alert, cooperative, and no distress Head: Normocephalic, without obvious abnormality, atraumatic Neck: no adenopathy, no JVD, supple, symmetrical, trachea midline, and thyroid  not enlarged, symmetric, no tenderness/mass/nodules Lymph nodes: Cervical, supraclavicular, and axillary nodes normal. Resp: clear  to auscultation bilaterally Back: symmetric, no curvature. ROM normal. No CVA tenderness. Cardio: regular rate and rhythm, S1, S2 normal, no murmur, click, rub or gallop GI: soft, non-tender; bowel sounds normal; no masses,  no organomegaly Extremities: extremities normal, atraumatic, no cyanosis or edema  ECOG PERFORMANCE STATUS: 1 - Symptomatic but completely ambulatory  Blood pressure 138/75, pulse 66, temperature 98.2 F (36.8 C), temperature source Temporal, resp. rate 17, height 5' 9.5 (1.765 m), weight 187 lb (84.8 kg), SpO2 100%.  LABORATORY DATA: Lab Results  Component Value Date   WBC 6.3 05/23/2024   HGB 14.0 05/23/2024   HCT 41.0 05/23/2024   MCV 91.3 05/23/2024   PLT 227 05/23/2024      Chemistry      Component Value Date/Time   NA 138  05/23/2024 0844   K 3.9 05/23/2024 0844   CL 101 05/23/2024 0844   CO2 31 05/23/2024 0844   BUN 19 05/23/2024 0844   CREATININE 1.22 05/23/2024 0844      Component Value Date/Time   CALCIUM  9.5 05/23/2024 0844   ALKPHOS 80 05/23/2024 0844   AST 16 05/23/2024 0844   ALT 12 05/23/2024 0844   BILITOT 0.5 05/23/2024 0844       RADIOGRAPHIC STUDIES: CT Chest W Contrast Result Date: 05/25/2024 CLINICAL DATA:  Non-small-cell lung cancer restaging * Tracking Code: BO * EXAM: CT CHEST WITH CONTRAST TECHNIQUE: Multidetector CT imaging of the chest was performed during intravenous contrast administration. RADIATION DOSE REDUCTION: This exam was performed according to the departmental dose-optimization program which includes automated exposure control, adjustment of the mA and/or kV according to patient size and/or use of iterative reconstruction technique. CONTRAST:  75mL OMNIPAQUE  IOHEXOL  300 MG/ML  SOLN COMPARISON:  02/18/2024 FINDINGS: Cardiovascular: Aortic atherosclerosis. Right chest port catheter. Normal heart size. Three-vessel coronary artery calcifications. No pericardial effusion. Mediastinum/Nodes: No enlarged mediastinal, hilar, or axillary lymph nodes. Thyroid  gland, trachea, and esophagus demonstrate no significant findings. Lungs/Pleura: Moderate centrilobular emphysema. Diffuse bilateral bronchial wall thickening. Scattered bronchiolar plugging. Status post left upper lobectomy with diminished volume of a trace, loculated left pleural effusion. Upper Abdomen: No acute abnormality. Musculoskeletal: No chest wall abnormality. No acute osseous findings. IMPRESSION: 1. Status post left upper lobectomy with diminished volume of a trace, loculated left pleural effusion. 2. No evidence of recurrent or metastatic disease in the chest. 3. Emphysema and diffuse bilateral bronchial wall thickening. Scattered bronchiolar plugging. 4. Coronary artery disease. Aortic Atherosclerosis (ICD10-I70.0) and  Emphysema (ICD10-J43.9). Electronically Signed   By: Marolyn JONETTA Jaksch M.D.   On: 05/25/2024 11:10     ASSESSMENT AND PLAN: This is a very pleasant 73 years old white male with Stage IIA (T1c, N1, M0) Squamous cell Carcinoma diagnosed in January of 2025. Diagnosed with NSCLC, squamous cell carcinoma, stage IIB due to lymph node involvement and tumor size (2.9 cm). Status post left upper lobectomy with lymph node dissection on October 03, 2023.  He has negative PD-L1 expression. The patient underwent adjuvant systemic chemotherapy with cisplatin  75 Mg/M2 and docetaxel  75 Mg/M2 with Neulasta  support status post 4 cycles.  He is currently on observation.  He had repeat CT scan of the chest performed recently.  I personally and independently reviewed the scan and discussed the results with the patient and his wife. His scan showed no concerning findings for disease recurrence or metastasis. Assessment and Plan Assessment & Plan Non-small cell lung cancer, status post left upper lobectomy, in surveillance 73 year old male with stage 2A non-small cell lung cancer,  squamous cell carcinoma subtype, diagnosed in January 2025. Status post left upper lobectomy with lymph node dissection and four cycles of adjuvant systemic chemotherapy with cisplatin  and docetaxel . Currently in surveillance. Recent CT scan of the chest shows no evidence of disease recurrence. Blood counts are within normal limits. No current symptoms such as chest pain, dyspnea, nausea, vomiting, diarrhea, or headaches. PET scan not indicated for routine surveillance unless CT findings are concerning due to high cost and specific utility in detecting active tumors. - Continue surveillance with CT scan in four months - Advise to call if any concerning symptoms arise The patient was advised to call immediately if she has any concerning symptoms in the interval. The patient voices understanding of current disease status and treatment options and is in  agreement with the current care plan.  All questions were answered. The patient knows to call the clinic with any problems, questions or concerns. We can certainly see the patient much sooner if necessary. The total time spent in the appointment was 20 minutes including review of chart and various tests results, discussions about plan of care and coordination of care plan .   Disclaimer: This note was dictated with voice recognition software. Similar sounding words can inadvertently be transcribed and may not be corrected upon review.

## 2024-06-05 ENCOUNTER — Other Ambulatory Visit (HOSPITAL_COMMUNITY): Payer: Self-pay

## 2024-06-11 ENCOUNTER — Telehealth: Payer: Self-pay | Admitting: Nurse Practitioner

## 2024-06-11 NOTE — Telephone Encounter (Signed)
 He can keep the appointment with me. Can we triage and give ED/UC precautions

## 2024-06-11 NOTE — Telephone Encounter (Signed)
 Noted

## 2024-06-11 NOTE — Telephone Encounter (Signed)
 Had hernia in groin area is on vacation and would like to know if they need a surgery or what is the size of apples will be home wed

## 2024-06-11 NOTE — Telephone Encounter (Signed)
 I spoke with pt and his wife; pt does not know how long he has had the knot in rt groin size of a small apple.now not in a lot of pain but tender to touch. Pt also has knot in abd about 6-7 inches from the knot in the groin.Pt will be returning home sometime on 06/13/24 and wife wants pt seen ASAP. UC & ED precautions given and pt and pts wife voiced understanding. Also advised pt not to do any heavy lifting,pulling or pushing and pt voiced understanding. Pts wife said will bring pt to [facility] on 06/14/24 at 10:45 to get checked in and if condition worsens before then she will take pt to ED; sending note to CHRISTELLA Crandall NP.

## 2024-06-13 ENCOUNTER — Other Ambulatory Visit (HOSPITAL_COMMUNITY): Payer: Self-pay

## 2024-06-14 ENCOUNTER — Ambulatory Visit (INDEPENDENT_AMBULATORY_CARE_PROVIDER_SITE_OTHER): Admitting: Nurse Practitioner

## 2024-06-14 VITALS — BP 130/72 | HR 66 | Temp 97.8°F | Ht 69.5 in | Wt 192.2 lb

## 2024-06-14 DIAGNOSIS — K4030 Unilateral inguinal hernia, with obstruction, without gangrene, not specified as recurrent: Secondary | ICD-10-CM | POA: Diagnosis not present

## 2024-06-14 NOTE — Progress Notes (Signed)
 Acute Office Visit  Subjective:     Patient ID: Cameron [name], male    DOB: Jun 14, 1951, 73 y.o.   MRN: [id number]  Chief Complaint  Patient presents with   Hernia    Pt complains hernia in groin on R side. States the hernia is tender but not really painful. Ongoing for about a week. Pt wife states after walking 4 flights of steps on vacation she noticed the hernia.     HPI  Discussed the use of AI scribe software for clinical note transcription with the patient, who gave verbal consent to proceed.  History of Present Illness Cameron [name] is a 73 year old male who presents with a bulge on the right side of his groin noticed after physical exertion.  He noticed a bulge on the right side of his groin after returning from the beach on June 06, 2024. The bulge, described as the size of a small apple, was initially uncomfortable but not painful. It remains uncomfortable without pain. No nausea, vomiting, fever, or changes in bowel habits have occurred since noticing the bulge. His bowel movements are daily, which is normal for him.  He has a history of a higher abdominal hernia following prostate and diverticulum surgery. There are no changes in urination since noticing the bulge, although he mentions having to sit down to urinate, which is a longstanding issue. He urinates two to three times a day, which is normal for him.  He has not had any prior inguinal hernias. The bulge does not seem as large currently, and he notes that it sometimes reduces in size.   Review of Systems  Constitutional:  Negative for chills and fever.  Respiratory:  Negative for shortness of breath.   Cardiovascular:  Negative for chest pain.  Gastrointestinal:  Negative for abdominal pain, constipation and diarrhea.        Objective:    BP 130/72   Pulse 66   Temp 97.8 F (36.6 C) (Oral)   Ht 5' 9.5 (1.765 m)   Wt 192 lb 3.2 oz (87.2 kg)   SpO2 98%   BMI 27.98 kg/m  BP Readings from Last 3  Encounters:  06/14/24 130/72  05/29/24 138/75  03/16/24 (!) 98/52   Wt Readings from Last 3 Encounters:  06/14/24 192 lb 3.2 oz (87.2 kg)  05/29/24 187 lb (84.8 kg)  03/16/24 193 lb (87.5 kg)   SpO2 Readings from Last 3 Encounters:  06/14/24 98%  05/29/24 100%  03/16/24 96%      Physical Exam Vitals and nursing note reviewed. Exam conducted with a chaperone present Devere Hummer, CMA).  Constitutional:      Appearance: Normal appearance.  Cardiovascular:     Rate and Rhythm: Normal rate and regular rhythm.     Heart sounds: Normal heart sounds.  Pulmonary:     Effort: Pulmonary effort is normal.     Breath sounds: Normal breath sounds.  Abdominal:     General: Bowel sounds are normal.     Tenderness: There is no abdominal tenderness.  Genitourinary:   Neurological:     Mental Status: He is alert.     No results found for any visits on 06/14/24.      Assessment & Plan:   Problem List Items Addressed This Visit   None Visit Diagnoses       Non-recurrent unilateral inguinal hernia with obstruction without gangrene    -  Primary   Relevant Orders   Ambulatory referral  to General Surgery     Assessment and Plan Assessment & Plan Right inguinal hernia without obstruction or gangrene Right inguinal hernia reducible, no signs of incarceration or strangulation. Discussed risks of incarceration and need for surgical intervention if symptomatic. - Perform physical examination to assess hernia size and reducibility. - Educated on signs of incarceration and need for urgent care if symptoms develop. - referral to surgeon today    No orders of the defined types were placed in this encounter.   Return for As scheduled .  Adina Crandall, NP

## 2024-06-14 NOTE — Patient Instructions (Signed)
 Nice to see you today  I have referred you to the surgeon Follow up as needed with me

## 2024-06-15 ENCOUNTER — Ambulatory Visit: Payer: Self-pay

## 2024-06-15 ENCOUNTER — Telehealth: Payer: Self-pay | Admitting: Nurse Practitioner

## 2024-06-15 NOTE — Telephone Encounter (Signed)
 See other note from [facility]

## 2024-06-15 NOTE — Telephone Encounter (Signed)
 FYI Only or Action Required?: FYI only for provider.  Patient was last seen in primary care on 06/14/2024 by Wendee Lynwood HERO, NP.  Called Nurse Triage reporting Inguinal Hernia.  Symptoms began yesterday.  Interventions attempted: Other: activity restrictions.  Symptoms are: unchanged.  Triage Disposition: home care  Patient/caregiver understands and will follow disposition?: Yes   Copied from CRM #[id number]. Topic: Clinical - Red Word Triage >> Jun 15, 2024  2:13 PM Rea C wrote: Red Word that prompted transfer to Nurse Triage: Hernia is enlarged from yesterday. Tenderness. And wants to know if it is normal or not. Reason for Disposition  Previously diagnosed hernia  Answer Assessment - Initial Assessment Questions Per patient, no change from exam on 9/18.  Wife made call out of an abundance of caution.  Appt. With general surgery on 06/19/24  1. ONSET:  When did this first appear?     Over the weekend 2. APPEARANCE: What does it look like?     Appears a little larger 3. SIZE: How big is it? (e.g., inches, cm; or compare to coins, fruit)      4. LOCATION: Where exactly is the hernia located?     right 5. PATTERN: Does the swelling come and go, or has it been constant since it started?     N/A 6. PAIN: Is there any pain? If Yes, ask: How bad is it?  (Scale 0-10; or none, mild, moderate, severe)     Denies pain or any change from exam on 9/18 7. DIAGNOSIS: Have you been seen by a doctor (or NP/PA) for this? Did the doctor diagnose you as having a hernia?     Diagnosed 06/14/2024 8. OTHER SYMPTOMS: Do you have any other symptoms? (e.g., fever, abdomen pain, vomiting)     denies 9. PREGNANCY: Is there any chance you are pregnant? When was your last menstrual period?     N/A  Protocols used: Hernia-A-AH

## 2024-06-15 NOTE — Telephone Encounter (Signed)
 If there is no change continue with plan of seeing surgeon as scheduled

## 2024-06-15 NOTE — Telephone Encounter (Signed)
 Copied from CRM [id number]. Topic: Clinical - Medical Advice >> Jun 15, 2024 11:48 AM Ahlexyia S wrote: Reason for CRM: Pt wife Nathanel called in stating that pt hernia is a little larger than what it was previously and she is wanting to know if that's normal considering Dr. Wendee was examining it at visit. Informed pt that I will send a message and someone should reach out momentarily.

## 2024-06-18 NOTE — H&P (View-Only) (Signed)
 Patient ID: Cameron [name], male   DOB: Feb 09, 1951, 73 y.o.   MRN: [id number]  Chief Complaint: Right inguinal hernia.  History of Present Illness Cameron [name] is a 73 y.o. male with recently discovered right inguinal hernia by his newlywed bride.   He presented with a bulge on the right side of his groin noticed after physical exertion.  He noticed a bulge on the right side of his groin after returning from the beach on June 06, 2024. The bulge, described as the size of a small apple, was initially uncomfortable but not painful. It remains uncomfortable without pain. No nausea, vomiting, fever, or changes in bowel habits have occurred since noticing the bulge. His bowel movements are daily, which is normal for him.  He has a history of an incisional hernia following prostate and bladder diverticulum surgery. There are no changes in urination since noticing the bulge, although he mentions having to sit down to urinate, which is a longstanding issue. He urinates two to three times a day, which is normal for him.   He has not had any prior inguinal hernias. The bulge does not seem as large currently, and he notes that it sometimes reduces in size.   Past Medical History Past Medical History:  Diagnosis Date   Adenomatous colon polyp 05/2009; 11/2014   2010:Tubular adenoma, no high grade dysplasia.SABRA  + Hyperplastic polyps. 2016: Tubular adenoma-rpt 5 yrs.   Cancer (HCC)    TURP for prostate cancer   COPD (chronic obstructive pulmonary disease) (HCC)    Coronary artery disease    Difficult intubation    H/O chronic gastritis 05/2009   EGD: no h.pylori,dysplasia,or evidence of malignancy   Hyperlipidemia    lovaza from prior PMD-stopped due to cost   Hypertension    Obesity, Class I, BMI 30.0-34.9 (see actual BMI) 04/23/2014   Tobacco dependence    Ventral hernia    small      Past Surgical History:  Procedure Laterality Date   APPENDECTOMY  09/28/1971   BIOPSY  08/15/2023    Procedure: BIOPSY;  Surgeon: Brenna Adine CROME, DO;  Location: [facility];  Service: Pulmonary;;   BLADDER DIVERTICULECTOMY N/A 09/30/2022   Procedure: BLADDER DIVERTICULECTOMY;  Surgeon: Renda Glance, MD;  Location: [facility];  Service: Urology;  Laterality: N/A;   CARPAL TUNNEL RELEASE Left    CHOLECYSTECTOMY N/A 07/06/2018   Procedure: LAPAROSCOPIC CHOLECYSTECTOMY WITH INTRAOPERATIVE CHOLANGIOGRAM;  Surgeon: Ethyl Lenis, MD;  Location: [facility];  Service: General;  Laterality: N/A;   COLONOSCOPY  09/27/2008   [facility]   CYSTOSCOPY N/A 09/30/2022   Procedure: PHYLLIS SIDE;  Surgeon: Renda Glance, MD;  Location: [facility];  Service: Urology;  Laterality: N/A;   EYE SURGERY  2018   Cataracts removed only   FINGER SURGERY Left 12/2021   Left middle finger, laceration   HEMOSTASIS CONTROL  08/15/2023   Procedure: HEMOSTASIS CONTROL;  Surgeon: Brenna Adine CROME, DO;  Location: [facility];  Service: Pulmonary;;   INTERCOSTAL NERVE BLOCK Left 10/03/2023   Procedure: INTERCOSTAL NERVE BLOCK;  Surgeon: Kerrin Elspeth BROCKS, MD;  Location: [facility] OR;  Service: Thoracic;  Laterality: Left;   IR IMAGING GUIDED PORT INSERTION  12/08/2023   LYMPH NODE BIOPSY Left 10/03/2023   Procedure: LYMPH NODE BIOPSY;  Surgeon: Kerrin Elspeth BROCKS, MD;  Location: [facility] OR;  Service: Thoracic;  Laterality: Left;   ROBOT ASSISTED LAPAROSCOPIC RADICAL PROSTATECTOMY N/A 09/30/2022   Procedure: XI ROBOTIC ASSISTED LAPAROSCOPIC RADICAL PROSTATECTOMY LEVEL 3;  Surgeon: Renda Glance, MD;  Location: [facility];  Service: Urology;  Laterality: N/A;  270 MINUTES NEEDED FOR CASE   STRABISMUS SURGERY  age 51 or 9   TRANSURETHRAL RESECTION OF PROSTATE N/A 07/08/2022   Procedure: TRANSURETHRAL RESECTION OF THE PROSTATE (TURP)/ CYSTOSCOPY;  Surgeon: Renda Glance, MD;  Location: [facility];  Service: Urology;  Laterality: N/A;   VIDEO BRONCHOSCOPY N/A 08/15/2023   Procedure: VIDEO BRONCHOSCOPY WITHOUT FLUORO;  Surgeon: Brenna Adine CROME, DO;   Location: [facility];  Service: Pulmonary;  Laterality: N/A;    Allergies  Allergen Reactions   Thorazine  [Chlorpromazine ] Other (See Comments)    Passed out    Current Outpatient Medications  Medication Sig Dispense Refill   ascorbic acid (VITAMIN C) 500 MG tablet Take 500 mg by mouth daily.     atorvastatin  (LIPITOR) 20 MG tablet Take 1 tablet (20 mg total) by mouth daily. 90 tablet 1   fluticasone  (FLONASE ) 50 MCG/ACT nasal spray Place 1 spray into both nostrils 2 (two) times daily as needed for allergies or rhinitis.     fluticasone -salmeterol (ADVAIR  DISKUS) 250-50 MCG/ACT AEPB Inhale 1 puff into the lungs in the morning and at bedtime. 180 each 0   gabapentin  (NEURONTIN ) 300 MG capsule Take 1 capsule (300 mg total) by mouth 2 (two) times daily. (Patient taking differently: Take 300 mg by mouth in the morning and at bedtime.) 90 capsule 2   hydrochlorothiazide  (MICROZIDE ) 12.5 MG capsule Take 1 capsule (12.5 mg total) by mouth daily. 90 capsule 1   lidocaine -prilocaine  (EMLA ) cream Apply to the Port-A-Cath site 30-60 minutes before chemotherapy (Patient taking differently: Apply 1 Application topically as needed (for port access).) 30 g 0   Multiple Vitamin (MULTIVITAMIN WITH MINERALS) TABS tablet Take 1 tablet by mouth daily.     valACYclovir  (VALTREX ) 1000 MG tablet Take 1 tablet (1,000 mg total) by mouth 2 (two) times daily for 3 days. Start within 72 hours of the first signs of a flare. (Patient taking differently: Take 500 mg by mouth daily.) 18 tablet 2   No current facility-administered medications for this visit.    Family History Family History  Problem Relation Age of Onset   Diabetes Mother    Heart disease Mother    COPD Father    Hypertension Father    Hyperlipidemia Father    Heart attack Father    Dementia Father    Alcohol abuse Maternal Grandfather       Social History Social History   Tobacco Use   Smoking status: Some Days    Current packs/day:  1.00    Average packs/day: 1 pack/day for 40.0 years (40.0 ttl pk-yrs)    Types: Cigarettes    Passive exposure: Past (last smoked a cigarette 08/14/23)   Smokeless tobacco: Never   Tobacco comments:    Maybe 1-1.5 cigarettes per day since 09/09/23  Vaping Use   Vaping status: Never Used  Substance Use Topics   Alcohol use: Not Currently   Drug use: No        Review of Systems  Constitutional: Negative.   HENT:  Positive for hearing loss and tinnitus.   Eyes: Negative.   Respiratory:  Positive for shortness of breath.   Cardiovascular: Negative.   Gastrointestinal: Negative.   Genitourinary: Negative.   Skin: Negative.   Neurological: Negative.   Psychiatric/Behavioral: Negative.       Physical Exam Blood pressure 120/72, pulse 74, height 5' 11 (1.803 m), weight 190 lb (86.2 kg),  SpO2 98%. Last Weight  Most recent update: 06/19/2024  2:18 PM    Weight  86.2 kg (190 lb)             CONSTITUTIONAL: Well developed, and nourished, appropriately responsive and aware without distress.   EYES: Sclera non-icteric.   EARS, NOSE, MOUTH AND THROAT:  The oropharynx is clear. Oral mucosa is pink and moist.    Hearing is intact to voice.  NECK: Trachea is midline, and there is no jugular venous distension.  LYMPH NODES:  Lymph nodes in the neck are not appreciated. RESPIRATORY:  Lungs are clear, and breath sounds are equal bilaterally.  Normal respiratory effort without pathologic use of accessory muscles. CARDIOVASCULAR: Heart is regular in rate and rhythm.   Well perfused.  GI: The abdomen is notable for a supraumbilical midline ventral hernia, reducible.  Otherwise soft, nontender, and nondistended. There were no palpable masses.  I did not appreciate hepatosplenomegaly.  GU: Large very obvious right inguinal hernia, reducible.  Testes descended bilaterally. MUSCULOSKELETAL:  Symmetrical muscle tone appreciated in all four extremities.    SKIN: Skin turgor is normal. No  pathologic skin lesions appreciated.  NEUROLOGIC:  Motor and sensation appear grossly normal.  Cranial nerves are grossly without defect. PSYCH:  Alert and oriented to person, place and time. Affect is appropriate for situation.  Data Reviewed I have personally reviewed what is currently available of the patient's imaging, recent labs and medical records.   Labs:     Latest Ref Rng & Units 05/23/2024    8:44 AM 03/01/2024   10:42 AM 02/23/2024   11:29 AM  CBC  WBC 4.0 - 10.5 K/uL 6.3  5.6  6.4   Hemoglobin 13.0 - 17.0 g/dL 85.9  89.4  89.6   Hematocrit 39.0 - 52.0 % 41.0  31.9  30.9   Platelets 150 - 400 K/uL 227  281  233.0       Latest Ref Rng & Units 05/23/2024    8:44 AM 03/23/2024    8:51 AM 03/16/2024   12:34 PM  CMP  Glucose 70 - 99 mg/dL 864  895  897   BUN 8 - 23 mg/dL 19  20  19    Creatinine 0.61 - 1.24 mg/dL 8.77  8.60  8.49   Sodium 135 - 145 mmol/L 138  134  137   Potassium 3.5 - 5.1 mmol/L 3.9  4.4  5.0   Chloride 98 - 111 mmol/L 101  97  99   CO2 22 - 32 mmol/L 31  29  29    Calcium  8.9 - 10.3 mg/dL 9.5  9.1  9.0   Total Protein 6.5 - 8.1 g/dL 6.6     Total Bilirubin 0.0 - 1.2 mg/dL 0.5     Alkaline Phos 38 - 126 U/L 80     AST 15 - 41 U/L 16     ALT 0 - 44 U/L 12       Imaging: Radiological images personally reviewed:  No appreciable evidence of groin hernia back in May 2025 based on the imaging I noted below, not reported, as well.   CLINICAL DATA:  Sepsis.  Syncope.  History of lung cancer.   EXAM:   CT ANGIOGRAPHY CHEST   CT ABDOMEN AND PELVIS WITH CONTRAST   TECHNIQUE: Multidetector CT imaging of the chest was performed using the standard protocol during bolus administration of intravenous contrast. Multiplanar CT image reconstructions and MIPs were obtained to evaluate the vascular anatomy. Multidetector  CT imaging of the abdomen and pelvis was performed using the standard protocol during bolus administration of intravenous contrast.    RADIATION DOSE REDUCTION: This exam was performed according to the departmental dose-optimization program which includes automated exposure control, adjustment of the mA and/or kV according to patient size and/or use of iterative reconstruction technique.   CONTRAST:  OMNIPAQUE  IOHEXOL  350 MG/ML SOLN   COMPARISON:  PET-CT 08/02/2023.   FINDINGS: CTA CHEST FINDINGS   Cardiovascular: Satisfactory opacification of the pulmonary arteries to the segmental level. No evidence of pulmonary embolism. Normal heart size. No pericardial effusion. Aortic atherosclerosis and multi vessel coronary artery calcifications.   Mediastinum/Nodes: No enlarged mediastinal, hilar, or axillary lymph nodes. Thyroid  gland, trachea, and esophagus demonstrate no significant findings.   Lungs/Pleura: Status post left upper lobectomy. Small left pleural effusion. Emphysema with diffuse bronchial wall thickening. No airspace consolidation, atelectasis or pneumothorax.   Musculoskeletal: No chest wall abnormality. No acute or significant osseous findings.   Review of the MIP images confirms the above findings.   CT ABDOMEN and PELVIS FINDINGS   Hepatobiliary: No focal liver abnormality. Status post cholecystectomy. No bile duct dilatation.   Pancreas: Unremarkable. No pancreatic ductal dilatation or surrounding inflammatory changes.   Spleen: Normal in size without focal abnormality.   Adrenals/Urinary Tract: Normal adrenal glands. No nephrolithiasis or hydronephrosis. Cyst off the posterior cortex of left kidney measures 2.1 cm, image 34/5. No follow-up imaging recommended. There is asymmetric right-sided perinephric soft tissue stranding. Striated nephrograms identified within the upper and lower pole of the right kidney. No obstructive uropathy identified bilaterally. Thick walled urinary bladder is decompressed around a Foley catheter.   Stomach/Bowel: Stomach is within normal limits.  Status post appendectomy. No evidence of bowel wall thickening, distention, or inflammatory changes.   Vascular/Lymphatic: Aortic atherosclerosis. 4.4 cm infrarenal abdominal aortic aneurysm, image 41/5. No abdominopelvic adenopathy.   Reproductive: Status post prostatectomy.   Other: No significant free fluid or fluid collections.   Musculoskeletal: No acute or significant osseous findings. Lumbar spondylosis. Schmorl's node deformity within the superior endplate of L4.   Review of the MIP images confirms the above findings.   IMPRESSION: 1. No evidence for acute pulmonary embolus. 2. Small left pleural effusion. 3. Emphysema with diffuse bronchial wall thickening. 4. There is asymmetric right-sided striated nephrograms with perinephric fat stranding. Correlate for clinical signs or symptoms of pyelonephritis. 5. Thick-walled urinary bladder decompressed around a Foley catheter. Correlate for any signs or symptoms of cystitis. 6. 4.4 cm infrarenal abdominal aortic aneurysm. Recommend follow-up CT or MR as appropriate in 12 months and referral to or continued care with vascular specialist. (Ref.: J Vasc Surg. 2018; 67:2-77 and J Am Coll Radiol 2013;10(10):789-794.) 7. Coronary artery calcifications. 8. Aortic Atherosclerosis (ICD10-I70.0) and Emphysema (ICD10-J43.9).     Electronically Signed   By: Waddell Calk M.D.   On: 02/18/2024 12:28   Within last 24 hrs: No results found.  Assessment     Patient Active Problem List   Diagnosis Date Noted   Incisional hernia, without obstruction or gangrene 06/19/2024   Right inguinal hernia 06/19/2024   Hypokalemia 03/16/2024   Abnormal CBC 02/23/2024   SIRS (systemic inflammatory response syndrome) (HCC) 02/19/2024   Multiple falls 02/10/2024   Lower extremity edema 02/10/2024   Urinary retention 02/10/2024   Hospital discharge follow-up 02/10/2024   Syncope 02/02/2024   AKI (acute kidney injury) 02/02/2024    Port-A-Cath in place 12/28/2023   Primary squamous cell carcinoma of bronchus of  left upper lobe (HCC) 11/10/2023   S/P lobectomy of lung 10/03/2023   Lung nodule 08/15/2023   Acute respiratory infection 06/28/2023   Loss of equilibrium 11/25/2022   Insomnia 11/25/2022   History of polycythemia 11/25/2022   Benign prostatic hyperplasia 07/08/2022   Acute cough 06/23/2022   Upper respiratory tract infection 06/23/2022   Preventative health care 05/04/2022   Abnormal hemoglobin (Hgb) 04/08/2020   Need for hepatitis C screening test 04/26/2018   Centrilobular emphysema (HCC) 04/20/2018   Polycythemia 04/20/2018   CAD (coronary artery disease) 03/31/2018   Visit for preventive health examination 02/27/2018   Overweight (BMI 25.0-29.9) 04/23/2014   Tobacco dependence 12/29/2012   Health maintenance examination 03/13/2012   Hyperlipemia, mixed 03/13/2012   HTN (hypertension), benign 02/11/2012    Plan    Robotic right inguinal hernia repair, possible open incisional hernia repair of initial hernia, reducible, fascial defect size less than 3 cm.   I discussed possibility of incarceration, strangulation, enlargement in size over time, and the need for emergency surgery in the face of these.  Also reviewed the techniques of reduction should incarceration occur, and when unsuccessful to present to the ED.  Also discussed that surgery risks include recurrence which can be up to 30% in the case of complex hernias, use of prosthetic materials (mesh) and the increased risk of infection and the possible need for re-operation and removal of mesh, possibility of post-op SBO or ileus, and the risks of general anesthetic including heart attack, stroke, sudden death or some reaction to anesthetic medications. The patient, and those present, appear to understand the risks, any and all questions were answered to the patient's satisfaction.  No guarantees were ever expressed or implied.    I personally  spent a total of 60 minutes in the care of the patient today including preparing to see the patient, getting/reviewing separately obtained history, performing a medically appropriate exam/evaluation, counseling and educating, placing orders, documenting clinical information in the EHR, independently interpreting results, and coordinating care.   These notes generated with voice recognition software. I apologize for typographical errors.  Honor Leghorn M.D., FACS 06/19/2024, 2:59 PM

## 2024-06-18 NOTE — Progress Notes (Unsigned)
 Patient ID: Cameron [name], male   DOB: Dec 28, 1950, 73 y.o.   MRN: [id number]  Chief Complaint: Right inguinal hernia.  History of Present Illness Cameron [name] is a 73 y.o. male with recently discovered right inguinal hernia by his newlywed bride.   He presented with a bulge on the right side of his groin noticed after physical exertion.  He noticed a bulge on the right side of his groin after returning from the beach on June 06, 2024. The bulge, described as the size of a small apple, was initially uncomfortable but not painful. It remains uncomfortable without pain. No nausea, vomiting, fever, or changes in bowel habits have occurred since noticing the bulge. His bowel movements are daily, which is normal for him.  He has a history of an incisional hernia following prostate and bladder diverticulum surgery. There are no changes in urination since noticing the bulge, although he mentions having to sit down to urinate, which is a longstanding issue. He urinates two to three times a day, which is normal for him.   He has not had any prior inguinal hernias. The bulge does not seem as large currently, and he notes that it sometimes reduces in size.   Past Medical History Past Medical History:  Diagnosis Date   Adenomatous colon polyp 05/2009; 11/2014   2010:Tubular adenoma, no high grade dysplasia.SABRA  + Hyperplastic polyps. 2016: Tubular adenoma-rpt 5 yrs.   Cancer (HCC)    TURP for prostate cancer   COPD (chronic obstructive pulmonary disease) (HCC)    Coronary artery disease    Difficult intubation    H/O chronic gastritis 05/2009   EGD: no h.pylori,dysplasia,or evidence of malignancy   Hyperlipidemia    lovaza from prior PMD-stopped due to cost   Hypertension    Obesity, Class I, BMI 30.0-34.9 (see actual BMI) 04/23/2014   Tobacco dependence    Ventral hernia    small      Past Surgical History:  Procedure Laterality Date   APPENDECTOMY  09/28/1971   BIOPSY  08/15/2023    Procedure: BIOPSY;  Surgeon: Brenna Adine CROME, DO;  Location: [facility];  Service: Pulmonary;;   BLADDER DIVERTICULECTOMY N/A 09/30/2022   Procedure: BLADDER DIVERTICULECTOMY;  Surgeon: Renda Glance, MD;  Location: [facility];  Service: Urology;  Laterality: N/A;   CARPAL TUNNEL RELEASE Left    CHOLECYSTECTOMY N/A 07/06/2018   Procedure: LAPAROSCOPIC CHOLECYSTECTOMY WITH INTRAOPERATIVE CHOLANGIOGRAM;  Surgeon: Ethyl Lenis, MD;  Location: [facility] OR;  Service: General;  Laterality: N/A;   COLONOSCOPY  09/27/2008   [facility]   CYSTOSCOPY N/A 09/30/2022   Procedure: PHYLLIS SIDE;  Surgeon: Renda Glance, MD;  Location: [facility];  Service: Urology;  Laterality: N/A;   EYE SURGERY  2018   Cataracts removed only   FINGER SURGERY Left 12/2021   Left middle finger, laceration   HEMOSTASIS CONTROL  08/15/2023   Procedure: HEMOSTASIS CONTROL;  Surgeon: Brenna Adine CROME, DO;  Location: [facility];  Service: Pulmonary;;   INTERCOSTAL NERVE BLOCK Left 10/03/2023   Procedure: INTERCOSTAL NERVE BLOCK;  Surgeon: Kerrin Elspeth BROCKS, MD;  Location: [facility] OR;  Service: Thoracic;  Laterality: Left;   IR IMAGING GUIDED PORT INSERTION  12/08/2023   LYMPH NODE BIOPSY Left 10/03/2023   Procedure: LYMPH NODE BIOPSY;  Surgeon: Kerrin Elspeth BROCKS, MD;  Location: [facility];  Service: Thoracic;  Laterality: Left;   ROBOT ASSISTED LAPAROSCOPIC RADICAL PROSTATECTOMY N/A 09/30/2022   Procedure: XI ROBOTIC ASSISTED LAPAROSCOPIC RADICAL PROSTATECTOMY LEVEL 3;  Surgeon: Renda Glance, MD;  Location: [facility];  Service: Urology;  Laterality: N/A;  270 MINUTES NEEDED FOR CASE   STRABISMUS SURGERY  age 89 or 9   TRANSURETHRAL RESECTION OF PROSTATE N/A 07/08/2022   Procedure: TRANSURETHRAL RESECTION OF THE PROSTATE (TURP)/ CYSTOSCOPY;  Surgeon: Renda Glance, MD;  Location: [facility];  Service: Urology;  Laterality: N/A;   VIDEO BRONCHOSCOPY N/A 08/15/2023   Procedure: VIDEO BRONCHOSCOPY WITHOUT FLUORO;  Surgeon: Brenna Adine CROME, DO;   Location: [facility];  Service: Pulmonary;  Laterality: N/A;    Allergies  Allergen Reactions   Thorazine  [Chlorpromazine ] Other (See Comments)    Passed out    Current Outpatient Medications  Medication Sig Dispense Refill   ascorbic acid (VITAMIN C) 500 MG tablet Take 500 mg by mouth daily.     atorvastatin  (LIPITOR) 20 MG tablet Take 1 tablet (20 mg total) by mouth daily. 90 tablet 1   fluticasone  (FLONASE ) 50 MCG/ACT nasal spray Place 1 spray into both nostrils 2 (two) times daily as needed for allergies or rhinitis.     fluticasone -salmeterol (ADVAIR  DISKUS) 250-50 MCG/ACT AEPB Inhale 1 puff into the lungs in the morning and at bedtime. 180 each 0   gabapentin  (NEURONTIN ) 300 MG capsule Take 1 capsule (300 mg total) by mouth 2 (two) times daily. (Patient taking differently: Take 300 mg by mouth in the morning and at bedtime.) 90 capsule 2   hydrochlorothiazide  (MICROZIDE ) 12.5 MG capsule Take 1 capsule (12.5 mg total) by mouth daily. 90 capsule 1   lidocaine -prilocaine  (EMLA ) cream Apply to the Port-A-Cath site 30-60 minutes before chemotherapy (Patient taking differently: Apply 1 Application topically as needed (for port access).) 30 g 0   Multiple Vitamin (MULTIVITAMIN WITH MINERALS) TABS tablet Take 1 tablet by mouth daily.     valACYclovir  (VALTREX ) 1000 MG tablet Take 1 tablet (1,000 mg total) by mouth 2 (two) times daily for 3 days. Start within 72 hours of the first signs of a flare. (Patient taking differently: Take 500 mg by mouth daily.) 18 tablet 2   No current facility-administered medications for this visit.    Family History Family History  Problem Relation Age of Onset   Diabetes Mother    Heart disease Mother    COPD Father    Hypertension Father    Hyperlipidemia Father    Heart attack Father    Dementia Father    Alcohol abuse Maternal Grandfather       Social History Social History   Tobacco Use   Smoking status: Some Days    Current packs/day:  1.00    Average packs/day: 1 pack/day for 40.0 years (40.0 ttl pk-yrs)    Types: Cigarettes    Passive exposure: Past (last smoked a cigarette 08/14/23)   Smokeless tobacco: Never   Tobacco comments:    Maybe 1-1.5 cigarettes per day since 09/09/23  Vaping Use   Vaping status: Never Used  Substance Use Topics   Alcohol use: Not Currently   Drug use: No        Review of Systems  Constitutional: Negative.   HENT:  Positive for hearing loss and tinnitus.   Eyes: Negative.   Respiratory:  Positive for shortness of breath.   Cardiovascular: Negative.   Gastrointestinal: Negative.   Genitourinary: Negative.   Skin: Negative.   Neurological: Negative.   Psychiatric/Behavioral: Negative.       Physical Exam Blood pressure 120/72, pulse 74, height 5' 11 (1.803 m), weight 190 lb (86.2 kg),  SpO2 98%. Last Weight  Most recent update: 06/19/2024  2:18 PM    Weight  86.2 kg (190 lb)             CONSTITUTIONAL: Well developed, and nourished, appropriately responsive and aware without distress.   EYES: Sclera non-icteric.   EARS, NOSE, MOUTH AND THROAT:  The oropharynx is clear. Oral mucosa is pink and moist.    Hearing is intact to voice.  NECK: Trachea is midline, and there is no jugular venous distension.  LYMPH NODES:  Lymph nodes in the neck are not appreciated. RESPIRATORY:  Lungs are clear, and breath sounds are equal bilaterally.  Normal respiratory effort without pathologic use of accessory muscles. CARDIOVASCULAR: Heart is regular in rate and rhythm.   Well perfused.  GI: The abdomen is notable for a supraumbilical midline ventral hernia, reducible.  Otherwise soft, nontender, and nondistended. There were no palpable masses.  I did not appreciate hepatosplenomegaly.  GU: Large very obvious right inguinal hernia, reducible.  Testes descended bilaterally. MUSCULOSKELETAL:  Symmetrical muscle tone appreciated in all four extremities.    SKIN: Skin turgor is normal. No  pathologic skin lesions appreciated.  NEUROLOGIC:  Motor and sensation appear grossly normal.  Cranial nerves are grossly without defect. PSYCH:  Alert and oriented to person, place and time. Affect is appropriate for situation.  Data Reviewed I have personally reviewed what is currently available of the patient's imaging, recent labs and medical records.   Labs:     Latest Ref Rng & Units 05/23/2024    8:44 AM 03/01/2024   10:42 AM 02/23/2024   11:29 AM  CBC  WBC 4.0 - 10.5 K/uL 6.3  5.6  6.4   Hemoglobin 13.0 - 17.0 g/dL 85.9  89.4  89.6   Hematocrit 39.0 - 52.0 % 41.0  31.9  30.9   Platelets 150 - 400 K/uL 227  281  233.0       Latest Ref Rng & Units 05/23/2024    8:44 AM 03/23/2024    8:51 AM 03/16/2024   12:34 PM  CMP  Glucose 70 - 99 mg/dL 864  895  897   BUN 8 - 23 mg/dL 19  20  19    Creatinine 0.61 - 1.24 mg/dL 8.77  8.60  8.49   Sodium 135 - 145 mmol/L 138  134  137   Potassium 3.5 - 5.1 mmol/L 3.9  4.4  5.0   Chloride 98 - 111 mmol/L 101  97  99   CO2 22 - 32 mmol/L 31  29  29    Calcium  8.9 - 10.3 mg/dL 9.5  9.1  9.0   Total Protein 6.5 - 8.1 g/dL 6.6     Total Bilirubin 0.0 - 1.2 mg/dL 0.5     Alkaline Phos 38 - 126 U/L 80     AST 15 - 41 U/L 16     ALT 0 - 44 U/L 12       Imaging: Radiological images personally reviewed:  No appreciable evidence of groin hernia back in May 2025 based on the imaging I noted below, not reported, as well.   CLINICAL DATA:  Sepsis.  Syncope.  History of lung cancer.   EXAM:   CT ANGIOGRAPHY CHEST   CT ABDOMEN AND PELVIS WITH CONTRAST   TECHNIQUE: Multidetector CT imaging of the chest was performed using the standard protocol during bolus administration of intravenous contrast. Multiplanar CT image reconstructions and MIPs were obtained to evaluate the vascular anatomy. Multidetector  CT imaging of the abdomen and pelvis was performed using the standard protocol during bolus administration of intravenous contrast.    RADIATION DOSE REDUCTION: This exam was performed according to the departmental dose-optimization program which includes automated exposure control, adjustment of the mA and/or kV according to patient size and/or use of iterative reconstruction technique.   CONTRAST:  OMNIPAQUE  IOHEXOL  350 MG/ML SOLN   COMPARISON:  PET-CT 08/02/2023.   FINDINGS: CTA CHEST FINDINGS   Cardiovascular: Satisfactory opacification of the pulmonary arteries to the segmental level. No evidence of pulmonary embolism. Normal heart size. No pericardial effusion. Aortic atherosclerosis and multi vessel coronary artery calcifications.   Mediastinum/Nodes: No enlarged mediastinal, hilar, or axillary lymph nodes. Thyroid  gland, trachea, and esophagus demonstrate no significant findings.   Lungs/Pleura: Status post left upper lobectomy. Small left pleural effusion. Emphysema with diffuse bronchial wall thickening. No airspace consolidation, atelectasis or pneumothorax.   Musculoskeletal: No chest wall abnormality. No acute or significant osseous findings.   Review of the MIP images confirms the above findings.   CT ABDOMEN and PELVIS FINDINGS   Hepatobiliary: No focal liver abnormality. Status post cholecystectomy. No bile duct dilatation.   Pancreas: Unremarkable. No pancreatic ductal dilatation or surrounding inflammatory changes.   Spleen: Normal in size without focal abnormality.   Adrenals/Urinary Tract: Normal adrenal glands. No nephrolithiasis or hydronephrosis. Cyst off the posterior cortex of left kidney measures 2.1 cm, image 34/5. No follow-up imaging recommended. There is asymmetric right-sided perinephric soft tissue stranding. Striated nephrograms identified within the upper and lower pole of the right kidney. No obstructive uropathy identified bilaterally. Thick walled urinary bladder is decompressed around a Foley catheter.   Stomach/Bowel: Stomach is within normal limits.  Status post appendectomy. No evidence of bowel wall thickening, distention, or inflammatory changes.   Vascular/Lymphatic: Aortic atherosclerosis. 4.4 cm infrarenal abdominal aortic aneurysm, image 41/5. No abdominopelvic adenopathy.   Reproductive: Status post prostatectomy.   Other: No significant free fluid or fluid collections.   Musculoskeletal: No acute or significant osseous findings. Lumbar spondylosis. Schmorl's node deformity within the superior endplate of L4.   Review of the MIP images confirms the above findings.   IMPRESSION: 1. No evidence for acute pulmonary embolus. 2. Small left pleural effusion. 3. Emphysema with diffuse bronchial wall thickening. 4. There is asymmetric right-sided striated nephrograms with perinephric fat stranding. Correlate for clinical signs or symptoms of pyelonephritis. 5. Thick-walled urinary bladder decompressed around a Foley catheter. Correlate for any signs or symptoms of cystitis. 6. 4.4 cm infrarenal abdominal aortic aneurysm. Recommend follow-up CT or MR as appropriate in 12 months and referral to or continued care with vascular specialist. (Ref.: J Vasc Surg. 2018; 67:2-77 and J Am Coll Radiol 2013;10(10):789-794.) 7. Coronary artery calcifications. 8. Aortic Atherosclerosis (ICD10-I70.0) and Emphysema (ICD10-J43.9).     Electronically Signed   By: Waddell Calk M.D.   On: 02/18/2024 12:28   Within last 24 hrs: No results found.  Assessment     Patient Active Problem List   Diagnosis Date Noted   Incisional hernia, without obstruction or gangrene 06/19/2024   Right inguinal hernia 06/19/2024   Hypokalemia 03/16/2024   Abnormal CBC 02/23/2024   SIRS (systemic inflammatory response syndrome) (HCC) 02/19/2024   Multiple falls 02/10/2024   Lower extremity edema 02/10/2024   Urinary retention 02/10/2024   Hospital discharge follow-up 02/10/2024   Syncope 02/02/2024   AKI (acute kidney injury) 02/02/2024    Port-A-Cath in place 12/28/2023   Primary squamous cell carcinoma of bronchus of  left upper lobe (HCC) 11/10/2023   S/P lobectomy of lung 10/03/2023   Lung nodule 08/15/2023   Acute respiratory infection 06/28/2023   Loss of equilibrium 11/25/2022   Insomnia 11/25/2022   History of polycythemia 11/25/2022   Benign prostatic hyperplasia 07/08/2022   Acute cough 06/23/2022   Upper respiratory tract infection 06/23/2022   Preventative health care 05/04/2022   Abnormal hemoglobin (Hgb) 04/08/2020   Need for hepatitis C screening test 04/26/2018   Centrilobular emphysema (HCC) 04/20/2018   Polycythemia 04/20/2018   CAD (coronary artery disease) 03/31/2018   Visit for preventive health examination 02/27/2018   Overweight (BMI 25.0-29.9) 04/23/2014   Tobacco dependence 12/29/2012   Health maintenance examination 03/13/2012   Hyperlipemia, mixed 03/13/2012   HTN (hypertension), benign 02/11/2012    Plan    Robotic right inguinal hernia repair, possible open incisional hernia repair of initial hernia, reducible, fascial defect size less than 3 cm.   I discussed possibility of incarceration, strangulation, enlargement in size over time, and the need for emergency surgery in the face of these.  Also reviewed the techniques of reduction should incarceration occur, and when unsuccessful to present to the ED.  Also discussed that surgery risks include recurrence which can be up to 30% in the case of complex hernias, use of prosthetic materials (mesh) and the increased risk of infection and the possible need for re-operation and removal of mesh, possibility of post-op SBO or ileus, and the risks of general anesthetic including heart attack, stroke, sudden death or some reaction to anesthetic medications. The patient, and those present, appear to understand the risks, any and all questions were answered to the patient's satisfaction.  No guarantees were ever expressed or implied.    I personally  spent a total of 60 minutes in the care of the patient today including preparing to see the patient, getting/reviewing separately obtained history, performing a medically appropriate exam/evaluation, counseling and educating, placing orders, documenting clinical information in the EHR, independently interpreting results, and coordinating care.   These notes generated with voice recognition software. I apologize for typographical errors.  Honor Leghorn M.D., FACS 06/19/2024, 2:59 PM

## 2024-06-19 ENCOUNTER — Encounter: Payer: Self-pay | Admitting: Internal Medicine

## 2024-06-19 ENCOUNTER — Encounter: Payer: Self-pay | Admitting: Surgery

## 2024-06-19 ENCOUNTER — Ambulatory Visit: Payer: Self-pay | Admitting: Surgery

## 2024-06-19 ENCOUNTER — Ambulatory Visit (INDEPENDENT_AMBULATORY_CARE_PROVIDER_SITE_OTHER): Payer: Self-pay | Admitting: Surgery

## 2024-06-19 VITALS — BP 120/72 | HR 74 | Ht 71.0 in | Wt 190.0 lb

## 2024-06-19 DIAGNOSIS — K432 Incisional hernia without obstruction or gangrene: Secondary | ICD-10-CM | POA: Insufficient documentation

## 2024-06-19 DIAGNOSIS — K4090 Unilateral inguinal hernia, without obstruction or gangrene, not specified as recurrent: Secondary | ICD-10-CM | POA: Insufficient documentation

## 2024-06-19 NOTE — Patient Instructions (Signed)
 You have chose to have your hernia repaired. This will be done by Dr. Claudine Mouton at [facility].  Please see your (blue) Pre-care information that you have been given today. Our surgery scheduler will call you to verify surgery date and to go over information.   You will need to arrange to be out of work for approximately 1-2 weeks and then you may return with a lifting restriction for 4 more weeks. If you have FMLA or Disability paperwork that needs to be filled out, please have your company fax your paperwork to [phone] or you may drop this by either office. This paperwork will be filled out within 3 days after your surgery has been completed.  You may have a bruise in your groin and also swelling and brusing in your testicle area. You may use ice 4-5 times daily for 15-20 minutes each time. Make sure that you place a barrier between you and the ice pack. To decrease the swelling, you may roll up a bath towel and place it vertically in between your thighs with your testicles resting on the towel. You will want to keep this area elevated as much as possible for several days following surgery.    Inguinal Hernia, Adult Muscles help keep everything in the body in its proper place. But if a weak spot in the muscles develops, something can poke through. That is called a hernia. When this happens in the lower part of the belly (abdomen), it is called an inguinal hernia. (It takes its name from a part of the body in this region called the inguinal canal.) A weak spot in the wall of muscles lets some fat or part of the small intestine bulge through. An inguinal hernia can develop at any age. Men get them more often than women. CAUSES  In adults, an inguinal hernia develops over time. It can be triggered by: Suddenly straining the muscles of the lower abdomen. Lifting heavy objects. Straining to have a bowel movement. Difficult bowel movements (constipation) can lead to this. Constant coughing.  This may be caused by smoking or lung disease. Being overweight. Being pregnant. Working at a job that requires long periods of standing or heavy lifting. Having had an inguinal hernia before. One type can be an emergency situation. It is called a strangulated inguinal hernia. It develops if part of the small intestine slips through the weak spot and cannot get back into the abdomen. The blood supply can be cut off. If that happens, part of the intestine may die. This situation requires emergency surgery. SYMPTOMS  Often, a small inguinal hernia has no symptoms. It is found when a healthcare provider does a physical exam. Larger hernias usually have symptoms.  In adults, symptoms may include: A lump in the groin. This is easier to see when the person is standing. It might disappear when lying down. In men, a lump in the scrotum. Pain or burning in the groin. This occurs especially when lifting, straining or coughing. A dull ache or feeling of pressure in the groin. Signs of a strangulated hernia can include: A bulge in the groin that becomes very painful and tender to the touch. A bulge that turns red or purple. Fever, nausea and vomiting. Inability to have a bowel movement or to pass gas. DIAGNOSIS  To decide if you have an inguinal hernia, a healthcare provider will probably do a physical examination. This will include asking questions about any symptoms you have noticed. The healthcare provider might  feel the groin area and ask you to cough. If an inguinal hernia is felt, the healthcare provider may try to slide it back into the abdomen. Usually no other tests are needed. TREATMENT  Treatments can vary. The size of the hernia makes a difference. Options include: Watchful waiting. This is often suggested if the hernia is small and you have had no symptoms. No medical procedure will be done unless symptoms develop. You will need to watch closely for symptoms. If any occur, contact your  healthcare provider right away. Surgery. This is used if the hernia is larger or you have symptoms. Open surgery. This is usually an outpatient procedure (you will not stay overnight in a hospital). An cut (incision) is made through the skin in the groin. The hernia is put back inside the abdomen. The weak area in the muscles is then repaired by herniorrhaphy or hernioplasty. Herniorrhaphy: in this type of surgery, the weak muscles are sewn back together. Hernioplasty: a patch or mesh is used to close the weak area in the abdominal wall. Laparoscopy. In this procedure, a surgeon makes small incisions. A thin tube with a tiny video camera (called a laparoscope) is put into the abdomen. The surgeon repairs the hernia with mesh by looking with the video camera and using two long instruments. HOME CARE INSTRUCTIONS  After surgery to repair an inguinal hernia: You will need to take pain medicine prescribed by your healthcare provider. Follow all directions carefully. You will need to take care of the wound from the incision. Your activity will be restricted for awhile. This will probably include no heavy lifting for several weeks. You also should not do anything too active for a few weeks. When you can return to work will depend on the type of job that you have. During "watchful waiting" periods, you should: Maintain a healthy weight. Eat a diet high in fiber (fruits, vegetables and whole grains). Drink plenty of fluids to avoid constipation. This means drinking enough water and other liquids to keep your urine clear or pale yellow. Do not lift heavy objects. Do not stand for long periods of time. Quit smoking. This should keep you from developing a frequent cough. SEEK MEDICAL CARE IF:  A bulge develops in your groin area. You feel pain, a burning sensation or pressure in the groin. This might be worse if you are lifting or straining. You develop a fever of more than 100.5 F (38.1 C). SEEK  IMMEDIATE MEDICAL CARE IF:  Pain in the groin increases suddenly. A bulge in the groin gets bigger suddenly and does not go down. For men, there is sudden pain in the scrotum. Or, the size of the scrotum increases. A bulge in the groin area becomes red or purple and is painful to touch. You have nausea or vomiting that does not go away. You feel your heart beating much faster than normal. You cannot have a bowel movement or pass gas. You develop a fever of more than 102.0 F (38.9 C).   This information is not intended to replace advice given to you by your health care provider. Make sure you discuss any questions you have with your health care provider.   Document Released: 01/30/2009 Document Revised: 12/06/2011 Document Reviewed: 03/17/2015 Elsevier Interactive Patient Education [employer]

## 2024-06-20 ENCOUNTER — Telehealth: Payer: Self-pay | Admitting: Nurse Practitioner

## 2024-06-20 ENCOUNTER — Telehealth: Payer: Self-pay | Admitting: Surgery

## 2024-06-20 ENCOUNTER — Encounter: Payer: Self-pay | Admitting: Nurse Practitioner

## 2024-06-20 ENCOUNTER — Ambulatory Visit: Admitting: Nurse Practitioner

## 2024-06-20 ENCOUNTER — Other Ambulatory Visit (HOSPITAL_COMMUNITY): Payer: Self-pay

## 2024-06-20 ENCOUNTER — Other Ambulatory Visit: Payer: Self-pay | Admitting: Nurse Practitioner

## 2024-06-20 ENCOUNTER — Other Ambulatory Visit

## 2024-06-20 VITALS — BP 128/62 | HR 66 | Temp 97.8°F | Ht 71.0 in | Wt 190.8 lb

## 2024-06-20 DIAGNOSIS — I10 Essential (primary) hypertension: Secondary | ICD-10-CM

## 2024-06-20 DIAGNOSIS — F17200 Nicotine dependence, unspecified, uncomplicated: Secondary | ICD-10-CM

## 2024-06-20 DIAGNOSIS — Z23 Encounter for immunization: Secondary | ICD-10-CM | POA: Diagnosis not present

## 2024-06-20 DIAGNOSIS — Z0000 Encounter for general adult medical examination without abnormal findings: Secondary | ICD-10-CM

## 2024-06-20 DIAGNOSIS — N401 Enlarged prostate with lower urinary tract symptoms: Secondary | ICD-10-CM | POA: Diagnosis not present

## 2024-06-20 DIAGNOSIS — Z125 Encounter for screening for malignant neoplasm of prostate: Secondary | ICD-10-CM | POA: Diagnosis not present

## 2024-06-20 DIAGNOSIS — J432 Centrilobular emphysema: Secondary | ICD-10-CM

## 2024-06-20 DIAGNOSIS — E663 Overweight: Secondary | ICD-10-CM | POA: Diagnosis not present

## 2024-06-20 DIAGNOSIS — B370 Candidal stomatitis: Secondary | ICD-10-CM

## 2024-06-20 DIAGNOSIS — E782 Mixed hyperlipidemia: Secondary | ICD-10-CM

## 2024-06-20 DIAGNOSIS — C3412 Malignant neoplasm of upper lobe, left bronchus or lung: Secondary | ICD-10-CM | POA: Diagnosis not present

## 2024-06-20 DIAGNOSIS — K4090 Unilateral inguinal hernia, without obstruction or gangrene, not specified as recurrent: Secondary | ICD-10-CM | POA: Diagnosis not present

## 2024-06-20 DIAGNOSIS — I2510 Atherosclerotic heart disease of native coronary artery without angina pectoris: Secondary | ICD-10-CM

## 2024-06-20 LAB — CBC WITH DIFFERENTIAL/PLATELET
Basophils Absolute: 0 K/uL (ref 0.0–0.1)
Basophils Relative: 0.6 % (ref 0.0–3.0)
Eosinophils Absolute: 0.2 K/uL (ref 0.0–0.7)
Eosinophils Relative: 2.5 % (ref 0.0–5.0)
HCT: 42.8 % (ref 39.0–52.0)
Hemoglobin: 14.1 g/dL (ref 13.0–17.0)
Lymphocytes Relative: 25.7 % (ref 12.0–46.0)
Lymphs Abs: 1.6 K/uL (ref 0.7–4.0)
MCHC: 33 g/dL (ref 30.0–36.0)
MCV: 93.1 fl (ref 78.0–100.0)
Monocytes Absolute: 0.5 K/uL (ref 0.1–1.0)
Monocytes Relative: 7.5 % (ref 3.0–12.0)
Neutro Abs: 4 K/uL (ref 1.4–7.7)
Neutrophils Relative %: 63.7 % (ref 43.0–77.0)
Platelets: 201 K/uL (ref 150.0–400.0)
RBC: 4.59 Mil/uL (ref 4.22–5.81)
RDW: 14.5 % (ref 11.5–15.5)
WBC: 6.3 K/uL (ref 4.0–10.5)

## 2024-06-20 LAB — URINALYSIS, MICROSCOPIC ONLY

## 2024-06-20 LAB — LIPID PANEL
Cholesterol: 142 mg/dL (ref 0–200)
HDL: 47.5 mg/dL (ref >39.00)
LDL Cholesterol: 79 mg/dL (ref 0–99)
NonHDL: 94.77
Total CHOL/HDL Ratio: 3
Triglycerides: 78 mg/dL (ref 0.0–149.0)
VLDL: 15.6 mg/dL (ref 0.0–40.0)

## 2024-06-20 LAB — COMPREHENSIVE METABOLIC PANEL WITH GFR
ALT: 13 U/L (ref 0–53)
AST: 15 U/L (ref 0–37)
Albumin: 4.2 g/dL (ref 3.5–5.2)
Alkaline Phosphatase: 78 U/L (ref 39–117)
BUN: 15 mg/dL (ref 6–23)
CO2: 34 meq/L — ABNORMAL HIGH (ref 19–32)
Calcium: 9.5 mg/dL (ref 8.4–10.5)
Chloride: 100 meq/L (ref 96–112)
Creatinine, Ser: 1.21 mg/dL (ref 0.40–1.50)
GFR: 60.34 mL/min (ref >60.00)
Glucose, Bld: 86 mg/dL (ref 70–99)
Potassium: 4.3 meq/L (ref 3.5–5.1)
Sodium: 139 meq/L (ref 135–145)
Total Bilirubin: 0.5 mg/dL (ref 0.2–1.2)
Total Protein: 6.3 g/dL (ref 6.0–8.3)

## 2024-06-20 LAB — PSA, MEDICARE: PSA: 0 ng/mL — ABNORMAL LOW (ref 0.10–4.00)

## 2024-06-20 LAB — HEMOGLOBIN A1C: Hgb A1c MFr Bld: 6.4 % (ref 4.6–6.5)

## 2024-06-20 LAB — TSH: TSH: 0.69 u[IU]/mL (ref 0.35–5.50)

## 2024-06-20 MED ORDER — NYSTATIN 100000 UNIT/ML MT SUSP
5.0000 mL | Freq: Four times a day (QID) | OROMUCOSAL | 0 refills | Status: AC
  Filled 2024-06-20: qty 140, 7d supply, fill #0

## 2024-06-20 NOTE — Assessment & Plan Note (Signed)
 History of the same and prostate cancer with status post TURP procedure.  Pending PSA today patient is followed by urology

## 2024-06-20 NOTE — Telephone Encounter (Signed)
 Patient has been advised of Pre-Admission date/time, and Surgery date at [facility].  Surgery Date: 07/04/24 Preadmission Testing Date: 06/27/24 (phone 8a-1p)  Patient informed of the scheduling process and surgery information given at time of office visit.   Patient has been made aware to call [phone], between 1-3:00pm the day before surgery, to find out what time to arrive for surgery.

## 2024-06-20 NOTE — Assessment & Plan Note (Signed)
 History of same was followed up pulmonology.  Patient currently maintained on Advair  twice daily.  No use of rescue inhaler.  Stable

## 2024-06-20 NOTE — Assessment & Plan Note (Signed)
 Has cut back on smoking.  Pending urine microscopy to rule out microscopic hematuria

## 2024-06-20 NOTE — Assessment & Plan Note (Signed)
 Pending TSH, A1c, lipid panel.

## 2024-06-20 NOTE — Assessment & Plan Note (Signed)
 Currently followed by oncology.

## 2024-06-20 NOTE — Assessment & Plan Note (Signed)
 Incidental finding on exam nystatin  mouthwash 4 times daily for 7 days.  Swish and spit

## 2024-06-20 NOTE — Patient Instructions (Signed)
 Nice to see you today I will be in touch with the labs once I have them  We did up date your flu vaccine today Follow up with me in 6 months, sooner if you need me

## 2024-06-20 NOTE — Assessment & Plan Note (Signed)
 Patient maintained on hydrochlorothiazide  12.5 daily.  Blood pressure controlled.  Continue medication as prescribed

## 2024-06-20 NOTE — Telephone Encounter (Signed)
 Still reach out to [facility] and get a copy of patient's most recent colonoscopy.  The last 1 to have on file is 2016 and they requested a repeat in 2021

## 2024-06-20 NOTE — Assessment & Plan Note (Signed)
 Recently evaluated for same and has been seen by general surgery and is scheduled for procedure

## 2024-06-20 NOTE — Assessment & Plan Note (Signed)
 History of the same.  Patient currently maintained on atorvastatin  20 mg daily.  Pending lipid panel

## 2024-06-20 NOTE — Progress Notes (Signed)
 Established Patient Office Visit  Subjective   Patient ID: Cameron [name], male    DOB: 1951/02/16  Age: 73 y.o. MRN: [id number]  Chief Complaint  Patient presents with   Annual Exam    Flu vaccine     HPI  HLD: Patient currently maintained on atorvastatin  20 mg daily  Lung disease: Patient currently maintained on Advair  250-50 twice daily. Does have albuterol   but has not had to use it.  Has not been followed up with pulmonology in almost 1 year  HTN: Currently maintained on hydrochlorothiazide  12.5 mg daily  Lung cancer: Patient is followed by Cameron. Gatha Cameron  Prostate cancer: Patient followed by urology Cameron. Gretel [name].  Does have a history of a bladder diverticulum that required intermittent catheterizations.  Patient along with intermittent caths  for complete physical and follow up of chronic conditions.  Immunizations: -Tetanus: Completed in 2021 -Influenza: update today  -Shingles: Discussed in office get local pharmacy -Pneumonia: Completed 2021 -COVID: Series  Diet: Fair diet. He is eating 2-3 meals a day and he will snack sometimes.  Exercise: No regular exercise.  Eye exam: Completes annually. Cameron [name]. Every 6 months   Dental exam: Completes semi-annually    Colonoscopy: Completed in 2016, repeat 5 years through Cameron [name] Lung Cancer Screening: Currently being treated by oncology  PSA: Due  Sleep: going to bed around 9-10 and will get up around 7. Feels rested   Advanced directive:  do have it and it should be on file.  Last scan 02/23/2024.      Review of Systems  Constitutional:  Negative for chills and fever.  Respiratory:  Negative for shortness of breath.   Cardiovascular:  Negative for chest pain and leg swelling.  Gastrointestinal:  Negative for abdominal pain, blood in stool, constipation, diarrhea, nausea and vomiting.       BM daily   Genitourinary:  Negative for dysuria and hematuria.  Neurological:  Negative for tingling and  headaches.  Psychiatric/Behavioral:  Negative for hallucinations and suicidal ideas.       Objective:     BP 128/62   Pulse 66   Temp 97.8 F (36.6 C) (Oral)   Ht 5' 11 (1.803 m)   Wt 190 lb 12.8 oz (86.5 kg)   SpO2 98%   BMI 26.61 kg/m  BP Readings from Last 3 Encounters:  06/20/24 128/62  06/19/24 120/72  06/14/24 130/72   Wt Readings from Last 3 Encounters:  06/20/24 190 lb 12.8 oz (86.5 kg)  06/19/24 190 lb (86.2 kg)  06/14/24 192 lb 3.2 oz (87.2 kg)   SpO2 Readings from Last 3 Encounters:  06/20/24 98%  06/19/24 98%  06/14/24 98%      Physical Exam Vitals and nursing note reviewed.  Constitutional:      Appearance: Normal appearance.  HENT:     Right Ear: Tympanic membrane, ear canal and external ear normal.     Left Ear: Tympanic membrane, ear canal and external ear normal.     Mouth/Throat:     Mouth: Mucous membranes are moist.     Pharynx: Oropharynx is clear.  Eyes:     Extraocular Movements: Extraocular movements intact.     Pupils: Pupils are equal, round, and reactive to light.  Cardiovascular:     Rate and Rhythm: Normal rate and regular rhythm.     Pulses: Normal pulses.     Heart sounds: Normal heart sounds.  Pulmonary:     Effort: Pulmonary effort  is normal.     Breath sounds: Rhonchi present.  Abdominal:     General: Bowel sounds are normal. There is no distension.     Palpations: There is no mass.     Tenderness: There is no abdominal tenderness.     Hernia: No hernia is present.  Musculoskeletal:     Right lower leg: No edema.     Left lower leg: No edema.  Lymphadenopathy:     Cervical: No cervical adenopathy.  Skin:    General: Skin is warm.  Neurological:     General: No focal deficit present.     Mental Status: He is alert.     Deep Tendon Reflexes:     Reflex Scores:      Bicep reflexes are 2+ on the right side and 2+ on the left side.      Patellar reflexes are 2+ on the right side and 2+ on the left side.     Comments: Bilateral upper and lower extremity strength 5/5  Psychiatric:        Mood and Affect: Mood normal.        Behavior: Behavior normal.        Thought Content: Thought content normal.        Judgment: Judgment normal.      No results found for any visits on 06/20/24.    The 10-year ASCVD risk score (Arnett DK, et al., 2019) is: 26.5%    Assessment & Plan:   Problem List Items Addressed This Visit       Cardiovascular and Mediastinum   HTN (hypertension), benign   Patient maintained on hydrochlorothiazide  12.5 daily.  Blood pressure controlled.  Continue medication as prescribed      Relevant Orders   Comprehensive metabolic panel with GFR   CBC with Differential/Platelet   TSH   CAD (coronary artery disease)   History of the same.  Patient currently maintained on atorvastatin  20 mg daily.  Pending lipid panel      Relevant Orders   Hemoglobin A1c   Lipid panel     Respiratory   Centrilobular emphysema (HCC)   History of same was followed up pulmonology.  Patient currently maintained on Advair  twice daily.  No use of rescue inhaler.  Stable      Primary squamous cell carcinoma of bronchus of left upper lobe (HCC)   Currently followed by oncology.        Digestive   Thrush   Incidental finding on exam nystatin  mouthwash 4 times daily for 7 days.  Swish and spit      Relevant Medications   nystatin  (MYCOSTATIN ) 100000 UNIT/ML suspension     Genitourinary   Benign prostatic hyperplasia   History of the same and prostate cancer with status post TURP procedure.  Pending PSA today patient is followed by urology      Relevant Orders   PSA, Medicare     Other   Tobacco dependence   Has cut back on smoking.  Pending urine microscopy to rule out microscopic hematuria      Relevant Orders   Urine Microscopic   Overweight (BMI 25.0-29.9)   Pending TSH, A1c, lipid panel.      Relevant Orders   Hemoglobin A1c   Lipid panel   Preventative health  care - Primary   Discussed age-appropriate immunizations and screening exams.  Did review patient's personal, surgical, social, family histories.  Patient is up-to-date on all age-appropriate vaccinations he would like.  Administer flu vaccine today.  Patient get shingles vaccine at local pharmacy.  Patient thinks he is up-to-date with CRC screening.  Will reach out to [name] to verify.  PSA for prostate cancer screening today.  Patient was given information at discharge about preventative healthcare maintenance with anticipatory guidance.      Relevant Orders   Comprehensive metabolic panel with GFR   CBC with Differential/Platelet   TSH   Right inguinal hernia   Recently evaluated for same and has been seen by general surgery and is scheduled for procedure      Other Visit Diagnoses       Screening for prostate cancer       Relevant Orders   PSA, Medicare     Need for influenza vaccination       Relevant Orders   Flu vaccine HIGH DOSE PF(Fluzone Trivalent) (Completed)       Return in about 6 months (around 12/18/2024) for BP recheck.    Adina Crandall, NP

## 2024-06-20 NOTE — Assessment & Plan Note (Signed)
 Discussed age-appropriate immunizations and screening exams.  Did review patient's personal, surgical, social, family histories.  Patient is up-to-date on all age-appropriate vaccinations he would like.  Administer flu vaccine today.  Patient get shingles vaccine at local pharmacy.  Patient thinks he is up-to-date with CRC screening.  Will reach out to [facility] to verify.  PSA for prostate cancer screening today.  Patient was given information at discharge about preventative healthcare maintenance with anticipatory guidance.

## 2024-06-20 NOTE — Telephone Encounter (Signed)
 Created e-fax in communication to request most recent Colonoscopy records from [facility].

## 2024-06-21 ENCOUNTER — Other Ambulatory Visit (HOSPITAL_COMMUNITY): Payer: Self-pay

## 2024-06-21 ENCOUNTER — Other Ambulatory Visit: Payer: Self-pay

## 2024-06-21 MED ORDER — FLUTICASONE-SALMETEROL 250-50 MCG/ACT IN AEPB
1.0000 | INHALATION_SPRAY | Freq: Two times a day (BID) | RESPIRATORY_TRACT | 1 refills | Status: AC
  Filled 2024-06-21: qty 180, 90d supply, fill #0
  Filled 2024-09-26: qty 180, 90d supply, fill #1

## 2024-06-21 MED ORDER — ATORVASTATIN CALCIUM 20 MG PO TABS
20.0000 mg | ORAL_TABLET | Freq: Every day | ORAL | 1 refills | Status: AC
  Filled 2024-06-21: qty 90, 90d supply, fill #0
  Filled 2024-09-26: qty 90, 90d supply, fill #1

## 2024-06-22 ENCOUNTER — Ambulatory Visit: Payer: Self-pay | Admitting: Nurse Practitioner

## 2024-06-22 DIAGNOSIS — R7303 Prediabetes: Secondary | ICD-10-CM | POA: Insufficient documentation

## 2024-06-27 ENCOUNTER — Other Ambulatory Visit (HOSPITAL_COMMUNITY): Payer: Self-pay

## 2024-06-27 ENCOUNTER — Encounter
Admission: RE | Admit: 2024-06-27 | Discharge: 2024-06-27 | Disposition: A | Discharge status: Home / Self Care | Source: Ambulatory Visit | Attending: Surgery | Admitting: Surgery

## 2024-06-27 ENCOUNTER — Other Ambulatory Visit: Payer: Self-pay

## 2024-06-27 HISTORY — DX: Prediabetes: R73.03

## 2024-06-27 HISTORY — DX: Other complications of anesthesia, initial encounter: T88.59XA

## 2024-06-27 HISTORY — DX: Unilateral inguinal hernia, without obstruction or gangrene, not specified as recurrent: K40.90

## 2024-06-27 HISTORY — DX: Dyspnea, unspecified: R06.00

## 2024-06-27 NOTE — Patient Instructions (Addendum)
 Your procedure is scheduled on: 07/04/24 - Wednesday Report to the Registration Desk on the 1st floor of the [facility]. To find out your arrival time, please call [phone] between 1PM - 3PM on: 07/03/24 - Tuesday If your arrival time is 6:00 am, do not arrive before that time as the [facility] entrance doors do not open until 6:00 am.  REMEMBER: Instructions that are not followed completely may result in serious medical risk, up to and including death; or upon the discretion of your surgeon and anesthesiologist your surgery may need to be rescheduled.  Do not eat food or drink any liquids after midnight the night before surgery.  No gum chewing or hard candies.  One week prior to surgery: Stop Anti-inflammatories (NSAIDS) such as Advil , Aleve, Ibuprofen , Motrin , Naproxen, Naprosyn and Aspirin based products such as Excedrin, Goody's Powder, BC Powder. You may take Tylenol  if needed for pain up until the day of surgery.  Stop ANY OVER THE COUNTER supplements until after surgery: Bioflavonoid Products , Multiple Vitamin.  ON THE DAY OF SURGERY ONLY TAKE THESE MEDICATIONS WITH SIPS OF WATER :  fluticasone -salmeterol (ADVAIR  DISKUS)   Use inhalers on the day of surgery and bring to the hospital.  No Alcohol for 24 hours before or after surgery.  No Smoking including e-cigarettes for 24 hours before surgery.  No chewable tobacco products for at least 6 hours before surgery.  No nicotine patches on the day of surgery.  Do not use any recreational drugs for at least a week (preferably 2 weeks) before your surgery.  Please be advised that the combination of cocaine and anesthesia may have negative outcomes, up to and including death. If you test positive for cocaine, your surgery will be cancelled.  On the morning of surgery brush your teeth with toothpaste and water , you may rinse your mouth with mouthwash if you wish. Do not swallow any toothpaste or mouthwash.  Do not wear  jewelry, make-up, hairpins, clips or nail polish.  For welded (permanent) jewelry: bracelets, anklets, waist bands, etc.  Please have this removed prior to surgery.  If it is not removed, there is a chance that hospital personnel will need to cut it off on the day of surgery.  Do not wear lotions, powders, or perfumes.   Do not shave body hair from the neck down 48 hours before surgery.  Contact lenses, hearing aids and dentures may not be worn into surgery.  Do not bring valuables to the hospital. [facility] is not responsible for any missing/lost belongings or valuables.   Notify your doctor if there is any change in your medical condition (cold, fever, infection).  Wear comfortable clothing (specific to your surgery type) to the hospital.  After surgery, you can help prevent lung complications by doing breathing exercises.  Take deep breaths and cough every 1-2 hours. Your doctor may order a device called an Incentive Spirometer to help you take deep breaths.  When coughing or sneezing, hold a pillow firmly against your incision with both hands. This is called "splinting." Doing this helps protect your incision. It also decreases belly discomfort.  If you are being admitted to the hospital overnight, leave your suitcase in the car. After surgery it may be brought to your room.  In case of increased patient census, it may be necessary for you, the patient, to continue your postoperative care in the Same Day Surgery department.  If you are being discharged the day of surgery, you will not  be allowed to drive home. You will need a responsible individual to drive you home and stay with you for 24 hours after surgery.   If you are taking public transportation, you will need to have a responsible individual with you.  Please call the Pre-admissions Testing Dept. at [phone] if you have any questions about these instructions.  Surgery Visitation Policy:  Patients having surgery  or a procedure may have two visitors.  Children under the age of 79 must have an adult with them who is not the patient.  Inpatient Visitation:    Visiting hours are 7 a.m. to 8 p.m. Up to four visitors are allowed at one time in a patient room. The visitors may rotate out with other people during the day.  One visitor age 4 or older may stay with the patient overnight and must be in the room by 8 p.m.   Merchandiser, retail to address health-related social needs:  [email or web address]

## 2024-06-28 ENCOUNTER — Other Ambulatory Visit (HOSPITAL_COMMUNITY): Payer: Self-pay

## 2024-07-03 ENCOUNTER — Telehealth: Payer: Self-pay | Admitting: Surgery

## 2024-07-03 NOTE — Telephone Encounter (Signed)
 Pt wife said that the patient has no control of his bladder  since his Prostrate surgery and has to wear Depends every night and wants to know if that will be an issue when he has his surgery 2 hernia in groin and one in abdominal are. Please advise pt # [phone].

## 2024-07-04 ENCOUNTER — Other Ambulatory Visit: Payer: Self-pay

## 2024-07-04 ENCOUNTER — Encounter: Payer: Self-pay | Admitting: Surgery

## 2024-07-04 ENCOUNTER — Encounter: Admission: RE | Disposition: A | Payer: Self-pay | Source: Home / Self Care | Attending: Surgery

## 2024-07-04 ENCOUNTER — Ambulatory Visit
Admission: RE | Admit: 2024-07-04 | Discharge: 2024-07-04 | Disposition: A | Discharge status: Home / Self Care | Attending: Surgery | Admitting: Surgery

## 2024-07-04 ENCOUNTER — Ambulatory Visit: Payer: Self-pay

## 2024-07-04 DIAGNOSIS — J432 Centrilobular emphysema: Secondary | ICD-10-CM | POA: Diagnosis not present

## 2024-07-04 DIAGNOSIS — K2950 Unspecified chronic gastritis without bleeding: Secondary | ICD-10-CM | POA: Insufficient documentation

## 2024-07-04 DIAGNOSIS — K4090 Unilateral inguinal hernia, without obstruction or gangrene, not specified as recurrent: Secondary | ICD-10-CM | POA: Diagnosis not present

## 2024-07-04 DIAGNOSIS — Z85118 Personal history of other malignant neoplasm of bronchus and lung: Secondary | ICD-10-CM | POA: Insufficient documentation

## 2024-07-04 DIAGNOSIS — E782 Mixed hyperlipidemia: Secondary | ICD-10-CM | POA: Diagnosis not present

## 2024-07-04 DIAGNOSIS — Z9221 Personal history of antineoplastic chemotherapy: Secondary | ICD-10-CM | POA: Diagnosis not present

## 2024-07-04 DIAGNOSIS — F17210 Nicotine dependence, cigarettes, uncomplicated: Secondary | ICD-10-CM | POA: Insufficient documentation

## 2024-07-04 DIAGNOSIS — I2510 Atherosclerotic heart disease of native coronary artery without angina pectoris: Secondary | ICD-10-CM | POA: Diagnosis not present

## 2024-07-04 DIAGNOSIS — K432 Incisional hernia without obstruction or gangrene: Secondary | ICD-10-CM | POA: Diagnosis not present

## 2024-07-04 DIAGNOSIS — I10 Essential (primary) hypertension: Secondary | ICD-10-CM | POA: Insufficient documentation

## 2024-07-04 DIAGNOSIS — Z902 Acquired absence of lung [part of]: Secondary | ICD-10-CM | POA: Diagnosis not present

## 2024-07-04 HISTORY — PX: INCISIONAL HERNIA REPAIR: SHX193

## 2024-07-04 HISTORY — PX: INSERTION OF MESH: SHX5868

## 2024-07-04 HISTORY — PX: HERNIORRHAPHY, INGUINAL, ROBOT-ASSISTED, LAPAROSCOPIC: SHX7585

## 2024-07-04 SURGERY — HERNIORRHAPHY, INGUINAL, ROBOT-ASSISTED, LAPAROSCOPIC
Anesthesia: General

## 2024-07-04 MED ORDER — ONDANSETRON HCL 4 MG/2ML IJ SOLN
INTRAMUSCULAR | Status: DC | PRN
  Administered 2024-07-04: 4 mg via INTRAVENOUS

## 2024-07-04 MED ORDER — MIDAZOLAM HCL 2 MG/2ML IJ SOLN
INTRAMUSCULAR | Status: AC
  Filled 2024-07-04: qty 2

## 2024-07-04 MED ORDER — CHLORHEXIDINE GLUCONATE 0.12 % MT SOLN
OROMUCOSAL | Status: AC
  Filled 2024-07-04: qty 15

## 2024-07-04 MED ORDER — PROPOFOL 10 MG/ML IV BOLUS
INTRAVENOUS | Status: AC
  Filled 2024-07-04: qty 20

## 2024-07-04 MED ORDER — DEXMEDETOMIDINE HCL IN NACL 80 MCG/20ML IV SOLN
INTRAVENOUS | Status: DC | PRN
  Administered 2024-07-04: 8 ug via INTRAVENOUS
  Administered 2024-07-04: 4 ug via INTRAVENOUS

## 2024-07-04 MED ORDER — CEFAZOLIN SODIUM-DEXTROSE 2-4 GM/100ML-% IV SOLN
INTRAVENOUS | Status: AC
  Filled 2024-07-04: qty 100

## 2024-07-04 MED ORDER — PROPOFOL 10 MG/ML IV BOLUS
INTRAVENOUS | Status: DC | PRN
Start: 2024-07-04 — End: 2024-07-04
  Administered 2024-07-04: 160 mg via INTRAVENOUS
  Administered 2024-07-04: 40 mg via INTRAVENOUS

## 2024-07-04 MED ORDER — ONDANSETRON HCL 4 MG/2ML IJ SOLN
INTRAMUSCULAR | Status: AC
  Filled 2024-07-04: qty 2

## 2024-07-04 MED ORDER — BUPIVACAINE-EPINEPHRINE (PF) 0.25% -1:200000 IJ SOLN
INTRAMUSCULAR | Status: DC | PRN
  Administered 2024-07-04: 30 mL

## 2024-07-04 MED ORDER — SUGAMMADEX SODIUM 200 MG/2ML IV SOLN
INTRAVENOUS | Status: DC | PRN
  Administered 2024-07-04: 200 mg via INTRAVENOUS

## 2024-07-04 MED ORDER — LIDOCAINE HCL (CARDIAC) PF 100 MG/5ML IV SOSY
PREFILLED_SYRINGE | INTRAVENOUS | Status: DC | PRN
  Administered 2024-07-04: 80 mg via INTRAVENOUS

## 2024-07-04 MED ORDER — ROCURONIUM BROMIDE 10 MG/ML (PF) SYRINGE
PREFILLED_SYRINGE | INTRAVENOUS | Status: AC
  Filled 2024-07-04: qty 10

## 2024-07-04 MED ORDER — ACETAMINOPHEN 10 MG/ML IV SOLN: 1000.0000 mg | Freq: Once | INTRAVENOUS | Status: DC | PRN

## 2024-07-04 MED ORDER — BUPIVACAINE LIPOSOME 1.3 % IJ SUSP: 20.0000 mL | Freq: Once | INTRAMUSCULAR | Status: DC

## 2024-07-04 MED ORDER — MIDAZOLAM HCL 2 MG/2ML IJ SOLN
INTRAMUSCULAR | Status: DC | PRN
  Administered 2024-07-04: 2 mg via INTRAVENOUS

## 2024-07-04 MED ORDER — CHLORHEXIDINE GLUCONATE 0.12 % MT SOLN
15.0000 mL | Freq: Once | OROMUCOSAL | Status: AC
  Administered 2024-07-04: 15 mL via OROMUCOSAL

## 2024-07-04 MED ORDER — OXYCODONE HCL 5 MG/5ML PO SOLN: 5.0000 mg | Freq: Once | ORAL | Status: DC | PRN

## 2024-07-04 MED ORDER — ALBUTEROL SULFATE HFA 108 (90 BASE) MCG/ACT IN AERS
INHALATION_SPRAY | RESPIRATORY_TRACT | Status: AC
  Filled 2024-07-04: qty 6.7

## 2024-07-04 MED ORDER — FENTANYL CITRATE (PF) 100 MCG/2ML IJ SOLN
INTRAMUSCULAR | Status: DC | PRN
  Administered 2024-07-04 (×2): 50 ug via INTRAVENOUS

## 2024-07-04 MED ORDER — ACETAMINOPHEN 500 MG PO TABS
ORAL_TABLET | ORAL | Status: AC
  Filled 2024-07-04: qty 2

## 2024-07-04 MED ORDER — ACETAMINOPHEN 500 MG PO TABS
1000.0000 mg | ORAL_TABLET | ORAL | Status: AC
  Administered 2024-07-04: 1000 mg via ORAL

## 2024-07-04 MED ORDER — HYDROCODONE-ACETAMINOPHEN 5-325 MG PO TABS
1.0000 | ORAL_TABLET | Freq: Four times a day (QID) | ORAL | 0 refills | Status: DC | PRN
  Filled 2024-07-04: qty 15, 4d supply, fill #0

## 2024-07-04 MED ORDER — DEXAMETHASONE SODIUM PHOSPHATE 10 MG/ML IJ SOLN
INTRAMUSCULAR | Status: DC | PRN
  Administered 2024-07-04: 10 mg via INTRAVENOUS

## 2024-07-04 MED ORDER — IBUPROFEN 800 MG PO TABS
800.0000 mg | ORAL_TABLET | Freq: Three times a day (TID) | ORAL | 0 refills | Status: AC | PRN
  Filled 2024-07-04: qty 30, 10d supply, fill #0

## 2024-07-04 MED ORDER — LACTATED RINGERS IV SOLN
INTRAVENOUS | Status: DC
Start: 2024-07-04 — End: 2024-07-04

## 2024-07-04 MED ORDER — LACTATED RINGERS IV SOLN: INTRAVENOUS | Status: DC

## 2024-07-04 MED ORDER — SUCCINYLCHOLINE CHLORIDE 200 MG/10ML IV SOSY
PREFILLED_SYRINGE | INTRAVENOUS | Status: DC | PRN
  Administered 2024-07-04: 100 mg via INTRAVENOUS

## 2024-07-04 MED ORDER — CELECOXIB 200 MG PO CAPS
200.0000 mg | ORAL_CAPSULE | ORAL | Status: AC
  Administered 2024-07-04: 200 mg via ORAL

## 2024-07-04 MED ORDER — CHLORHEXIDINE GLUCONATE CLOTH 2 % EX PADS
6.0000 | MEDICATED_PAD | Freq: Once | CUTANEOUS | Status: AC
  Administered 2024-07-04: 6 via TOPICAL

## 2024-07-04 MED ORDER — OXYCODONE HCL 5 MG PO TABS: 5.0000 mg | ORAL_TABLET | Freq: Once | ORAL | Status: DC | PRN

## 2024-07-04 MED ORDER — GABAPENTIN 300 MG PO CAPS
ORAL_CAPSULE | ORAL | Status: AC
  Filled 2024-07-04: qty 1

## 2024-07-04 MED ORDER — LIDOCAINE HCL (PF) 2 % IJ SOLN
INTRAMUSCULAR | Status: AC
Start: 2024-07-04 — End: 2024-07-04
  Filled 2024-07-04: qty 5

## 2024-07-04 MED ORDER — ORAL CARE MOUTH RINSE: 15.0000 mL | Freq: Once | OROMUCOSAL | Status: AC

## 2024-07-04 MED ORDER — FENTANYL CITRATE (PF) 100 MCG/2ML IJ SOLN
INTRAMUSCULAR | Status: AC
  Filled 2024-07-04: qty 2

## 2024-07-04 MED ORDER — BUPIVACAINE-EPINEPHRINE (PF) 0.25% -1:200000 IJ SOLN
INTRAMUSCULAR | Status: AC
  Filled 2024-07-04: qty 30

## 2024-07-04 MED ORDER — CELECOXIB 200 MG PO CAPS
ORAL_CAPSULE | ORAL | Status: AC
  Filled 2024-07-04: qty 1

## 2024-07-04 MED ORDER — DEXAMETHASONE SODIUM PHOSPHATE 10 MG/ML IJ SOLN
INTRAMUSCULAR | Status: AC
  Filled 2024-07-04: qty 1

## 2024-07-04 MED ORDER — ONDANSETRON HCL 4 MG/2ML IJ SOLN: 4.0000 mg | Freq: Once | INTRAMUSCULAR | Status: DC | PRN

## 2024-07-04 MED ORDER — CHLORHEXIDINE GLUCONATE CLOTH 2 % EX PADS: 6.0000 | MEDICATED_PAD | Freq: Once | CUTANEOUS | Status: DC

## 2024-07-04 MED ORDER — ALBUTEROL SULFATE HFA 108 (90 BASE) MCG/ACT IN AERS
INHALATION_SPRAY | RESPIRATORY_TRACT | Status: DC | PRN
Start: 2024-07-04 — End: 2024-07-04
  Administered 2024-07-04: 5 via RESPIRATORY_TRACT

## 2024-07-04 MED ORDER — SUCCINYLCHOLINE CHLORIDE 200 MG/10ML IV SOSY
PREFILLED_SYRINGE | INTRAVENOUS | Status: AC
Start: 2024-07-04 — End: 2024-07-04
  Filled 2024-07-04: qty 10

## 2024-07-04 MED ORDER — ROCURONIUM BROMIDE 100 MG/10ML IV SOLN
INTRAVENOUS | Status: DC | PRN
  Administered 2024-07-04: 50 mg via INTRAVENOUS
  Administered 2024-07-04 (×2): 10 mg via INTRAVENOUS

## 2024-07-04 MED ORDER — CEFAZOLIN SODIUM-DEXTROSE 2-4 GM/100ML-% IV SOLN
2.0000 g | INTRAVENOUS | Status: AC
  Administered 2024-07-04: 2 g via INTRAVENOUS

## 2024-07-04 MED ORDER — FENTANYL CITRATE (PF) 100 MCG/2ML IJ SOLN: 25.0000 ug | INTRAMUSCULAR | Status: DC | PRN

## 2024-07-04 MED ORDER — GABAPENTIN 300 MG PO CAPS
300.0000 mg | ORAL_CAPSULE | ORAL | Status: AC
  Administered 2024-07-04: 300 mg via ORAL

## 2024-07-04 SURGICAL SUPPLY — 53 items
BINDER ABD UNIV 12 45-62 (WOUND CARE) IMPLANT
BINDER ABD UNIV 9 30-45 (GAUZE/BANDAGES/DRESSINGS) IMPLANT
BLADE CLIPPER SURG (BLADE) ×2 IMPLANT
CHLORAPREP W/TINT 26 (MISCELLANEOUS) IMPLANT
COVER TIP SHEARS 8 DVNC (MISCELLANEOUS) ×2 IMPLANT
COVER WAND RF STERILE (DRAPES) ×2 IMPLANT
DEFOGGER SCOPE WARM SEASHARP (MISCELLANEOUS) ×2 IMPLANT
DERMABOND ADVANCED .7 DNX12 (GAUZE/BANDAGES/DRESSINGS) ×2 IMPLANT
DRAPE ARM DVNC X/XI (DISPOSABLE) ×6 IMPLANT
DRAPE COLUMN DVNC XI (DISPOSABLE) ×2 IMPLANT
DRAPE LAPAROTOMY 100X77 ABD (DRAPES) ×2 IMPLANT
DRAPE UTILITY 15X26 TOWEL STRL (DRAPES) ×2 IMPLANT
ELECTRODE REM PT RTRN 9FT ADLT (ELECTROSURGICAL) ×2 IMPLANT
FORCEPS BPLR R/ABLATION 8 DVNC (INSTRUMENTS) ×2 IMPLANT
GAUZE 4X4 16PLY ~~LOC~~+RFID DBL (SPONGE) IMPLANT
GLOVE ORTHO TXT STRL SZ7.5 (GLOVE) ×6 IMPLANT
GOWN STRL REUS W/ TWL LRG LVL3 (GOWN DISPOSABLE) ×2 IMPLANT
GOWN STRL REUS W/ TWL XL LVL3 (GOWN DISPOSABLE) ×4 IMPLANT
GRASPER SUT TROCAR 14GX15 (MISCELLANEOUS) IMPLANT
IRRIGATION STRYKERFLOW (MISCELLANEOUS) IMPLANT
IV 0.9% NACL 1000 ML (IV SOLUTION) IMPLANT
KIT PINK PAD W/HEAD ARM REST (MISCELLANEOUS) ×2 IMPLANT
KIT TURNOVER KIT A (KITS) ×2 IMPLANT
LABEL OR SOLS (LABEL) ×2 IMPLANT
MANIFOLD NEPTUNE II (INSTRUMENTS) ×2 IMPLANT
MESH 3DMAX LIGHT 4.8X6.7 RT XL (Mesh General) IMPLANT
NDL DRIVE SUT CUT DVNC (INSTRUMENTS) ×2 IMPLANT
NDL HYPO 22X1.5 SAFETY MO (MISCELLANEOUS) ×2 IMPLANT
NDL INSUFFLATION 14GA 120MM (NEEDLE) IMPLANT
NEEDLE DRIVE SUT CUT DVNC (INSTRUMENTS) ×2 IMPLANT
NEEDLE HYPO 22X1.5 SAFETY MO (MISCELLANEOUS) ×2 IMPLANT
NEEDLE INSUFFLATION 14GA 120MM (NEEDLE) ×2 IMPLANT
NS IRRIG 500ML POUR BTL (IV SOLUTION) ×2 IMPLANT
PACK BASIN MINOR ARMC (MISCELLANEOUS) ×2 IMPLANT
PACK LAP CHOLECYSTECTOMY (MISCELLANEOUS) ×2 IMPLANT
SCISSORS MNPLR CVD DVNC XI (INSTRUMENTS) ×2 IMPLANT
SEAL UNIV 5-12 XI (MISCELLANEOUS) ×6 IMPLANT
SET TUBE SMOKE EVAC HIGH FLOW (TUBING) ×2 IMPLANT
SOLUTION ELECTROSURG ANTI STCK (MISCELLANEOUS) ×2 IMPLANT
SPONGE T-LAP 18X18 ~~LOC~~+RFID (SPONGE) ×2 IMPLANT
STAPLER SKIN PROX 35W (STAPLE) IMPLANT
SUT ETHIBOND 0 MO6 C/R (SUTURE) ×2 IMPLANT
SUT STRATA 2-0 23CM CT-2 (SUTURE) IMPLANT
SUT STRATA 3-0 SH (SUTURE) IMPLANT
SUT VIC AB 2-0 SH 27XBRD (SUTURE) ×2 IMPLANT
SUT VIC AB 3-0 SH 27X BRD (SUTURE) ×2 IMPLANT
SUT VICRYL 0 UR6 27IN ABS (SUTURE) IMPLANT
SUTURE MNCRL 4-0 27XMF (SUTURE) ×2 IMPLANT
SYR 20ML LL LF (SYRINGE) ×2 IMPLANT
SYR BULB IRRIG 60ML STRL (SYRINGE) ×2 IMPLANT
TRAP FLUID SMOKE EVACUATOR (MISCELLANEOUS) ×2 IMPLANT
TROCAR Z-THREAD FIOS 11X100 BL (TROCAR) IMPLANT
WATER STERILE IRR 500ML POUR (IV SOLUTION) ×2 IMPLANT

## 2024-07-04 NOTE — Op Note (Signed)
 Robotic assisted Laparoscopic Transabdominal right inguinal Hernia Repair with Mesh.  Open repair of midline incisional hernia, initial reducible defect at less than 3 cm.       Pre-operative Diagnosis: Right inguinal Hernia, incisional hernia supraumbilical, less than 3 cm.   Post-operative Diagnosis: Same   Procedure: Robotic assisted Laparoscopic  repair of right inguinal hernia(s), open repair of supraumbilical incisional hernia, less than 3 cm, reducible, initial.   Surgeon: Honor Leghorn, M.D., FACS   Anesthesia: GETA   Findings: Right inguinal hernia, no evidence of left sided hernia.     Supraumbilical incisional hernia, less than 3 cm, no contents noted on laparoscopy.     Procedure Details  The patient was seen again in the Holding Room. The benefits, complications, treatment options, and expected outcomes were discussed with the patient. The risks of bleeding, infection, recurrence of symptoms, failure to resolve symptoms, recurrence of hernia, ischemic orchitis, chronic pain syndrome or neuroma, were reviewed again. The likelihood of improving the patient's symptoms with return to their baseline status is good.  The patient and/or family concurred with the proposed plan, giving informed consent.  The patient was taken to Operating Room, identified  and the procedure verified as Laparoscopic Inguinal Hernia Repair. Laterality confirmed.  A Time Out was held and the above information confirmed.   Prior to the induction of general anesthesia, antibiotic prophylaxis was administered. VTE prophylaxis was in place. General endotracheal anesthesia was then administered and tolerated well. After the induction, the abdomen was prepped with Chloraprep and draped in the sterile fashion. The patient was positioned in the supine position.   After local infiltration of quarter percent Marcaine  with epinephrine , stab incision was made left upper quadrant.  On the left at Palmer's point, the  Veress needle is passed with sensation of the layers to penetrate the abdominal wall and into the peritoneum.  Saline drop test is confirmed peritoneal placement.  Insufflation is initiated with carbon dioxide to pressures of 15 mmHg. An 8.5 mm port is placed to the left off of the midline, with blunt tipped trocar.  Pneumoperitoneum maintained w/o HD changes  to pressures of 15 mm Hg with CO2. No evidence of bowel injuries.  Two 8.5 mm ports placed under direct vision in each upper quadrant. The laparoscopy revealed a right indirect defect(s).  Along with significant scarring of the bladder and adjacent adipose tissue to right pubic rami/Cooper's ligament.   The robot was brought to the table and docked in the standard fashion, no collision between arms was observed. Instruments were kept under direct view at all times. For right inguinal hernia repair,  I developed a thicker than usual peritoneal flap, ensuring dissection continued along the rectus muscle anteriorly, to approach the pubic rami and hug the pubic rami dissecting some of the scarred bladder and adjacent soft tissues. The sac(s) were reduced and dissected free from adjacent structures. We preserved the vas and the vessels, and visualized them to their convergence and beyond in the retroperitoneum. Once dissection was completed a right sided extra large  BARD 3D Light mesh was placed and secured at 4 points with interrupted 2-0 Vicryl to the pubic tubercle, rami and anteriorly. There was good coverage of the direct, indirect and femoral spaces.  Second look revealed no complications or injuries.  The flap was then closed with 3-0 V-lock suture.  Peritoneal closure without defects.  Once assuring that hemostasis was adequate, all needles/sponges removed, and the robot was undocked.   Insufflation was turned  off in the midline port that was placed through the hernia defect was removed, the incision was extended and a fascial closure with  interrupted 0 Ethibond was completed of the midline less than 3 cm reducible defect.  The abdomen was then reinsufflated and the closure inspected from within.  Was felt to be adequate, photo taken confirming. The ports were then removed, and the abdomen desulflated.  4-0 subcuticular Monocryl was used at all skin edges. Dermabond was placed.  Patient tolerated the procedure well. There were no complications. He was taken to the recovery room in stable condition.           Honor Leghorn, M.D., FACS 07/04/2024, 1:59 PM

## 2024-07-04 NOTE — Interval H&P Note (Signed)
 History and Physical Interval Note:  07/04/2024 11:09 AM  Cameron [name]  has presented today for surgery, with the diagnosis of right inguinal hernia incisional hernia.  The various methods of treatment have been discussed with the patient and family. After consideration of risks, benefits and other options for treatment, the patient has consented to  Procedure(s) with comments: HERNIORRHAPHY, INGUINAL, ROBOT-ASSISTED, LAPAROSCOPIC (N/A) REPAIR, HERNIA, INCISIONAL (N/A) - possible open incisional hernia repair as a surgical intervention.  The patient's history has been reviewed, patient examined, no change in status, stable for surgery.  I have reviewed the patient's chart and labs.  Questions were answered to the patient's satisfaction.     Honor Leghorn

## 2024-07-04 NOTE — Anesthesia Preprocedure Evaluation (Addendum)
 Anesthesia Evaluation  Patient identified by MRN, date of birth, ID band Patient awake    Reviewed: Allergy & Precautions, NPO status , Patient's Chart, lab work & pertinent test results  History of Anesthesia Complications (+) DIFFICULT AIRWAY and history of anesthetic complications (intubation note 10/03/23: Airway difficult r/t small glottic opening, large tongue and anterior airway; glide 4 used with epiglottis held out of view by blade.)  Airway Mallampati: III   Neck ROM: Full    Dental  (+) Missing   Pulmonary COPD, Current Smoker (2 packs per week)Patient did not abstain from smoking. Lung CA: NSCLC and SCC s/p LUL lobectomy 10/03/23 followed by chemo   Pulmonary exam normal breath sounds clear to auscultation       Cardiovascular hypertension, + CAD  Normal cardiovascular exam Rhythm:Regular Rate:Normal  ECG 02/17/24:  Age not entered, assumed to be 73 years old for purpose of ECG interpretation Sinus rhythm Borderline T wave abnormalities   Neuro/Psych negative neurological ROS     GI/Hepatic negative GI ROS,,,  Endo/Other  negative endocrine ROS    Renal/GU negative Renal ROS   Prostate CA    Musculoskeletal   Abdominal   Peds  Hematology negative hematology ROS (+)   Anesthesia Other Findings   Reproductive/Obstetrics                              Anesthesia Physical Anesthesia Plan  ASA: 3  Anesthesia Plan: General   Post-op Pain Management:    Induction: Intravenous  PONV Risk Score and Plan: 1 and Ondansetron , Dexamethasone  and Treatment may vary due to age or medical condition  Airway Management Planned: Oral ETT  Additional Equipment:   Intra-op Plan:   Post-operative Plan: Extubation in OR  Informed Consent: I have reviewed the patients History and Physical, chart, labs and discussed the procedure including the risks, benefits and alternatives for the  proposed anesthesia with the patient or authorized representative who has indicated his/her understanding and acceptance.     Dental advisory given  Plan Discussed with: CRNA  Anesthesia Plan Comments: (Patient consented for risks of anesthesia including but not limited to:  - adverse reactions to medications - damage to eyes, teeth, lips or other oral mucosa - nerve damage due to positioning  - sore throat or hoarseness - damage to heart, brain, nerves, lungs, other parts of body or loss of life  Informed patient about role of CRNA in peri- and intra-operative care.  Patient voiced understanding.)         Anesthesia Quick Evaluation

## 2024-07-04 NOTE — Transfer of Care (Signed)
 Immediate Anesthesia Transfer of Care Note  Patient: Cameron [name]  Procedure(s) Performed: HERNIORRHAPHY, INGUINAL, ROBOT-ASSISTED, LAPAROSCOPIC REPAIR, HERNIA, INCISIONAL INSERTION OF MESH  Patient Location: PACU  Anesthesia Type:General  Level of Consciousness: drowsy  Airway & Oxygen Therapy: Patient Spontanous Breathing and Patient connected to face mask  Post-op Assessment: Report given to RN and Post -op Vital signs reviewed and stable  Post vital signs: Reviewed and stable  Last Vitals:  Vitals Value Taken Time  BP 117/69 07/04/24 13:55  Temp    Pulse 66 07/04/24 13:58  Resp 12 07/04/24 13:58  SpO2 99 % 07/04/24 13:58  Vitals shown include unfiled device data.  Last Pain:  Vitals:   07/04/24 1014  TempSrc: Temporal  PainSc: 0-No pain         Complications: There were no known notable events for this encounter.

## 2024-07-04 NOTE — Anesthesia Procedure Notes (Signed)
 Procedure Name: Intubation Date/Time: 07/04/2024 11:43 AM  Performed by: Niki Manus SAUNDERS, CRNAPre-anesthesia Checklist: Patient identified, Emergency Drugs available, Suction available and Patient being monitored Patient Re-evaluated:Patient Re-evaluated prior to induction Oxygen Delivery Method: Circle system utilized Preoxygenation: Pre-oxygenation with 100% oxygen Induction Type: IV induction Ventilation: Mask ventilation without difficulty Laryngoscope Size: McGrath and 4 Grade View: Grade I Tube type: Oral Tube size: 7.5 mm Number of attempts: 1 Airway Equipment and Method: Stylet Placement Confirmation: ETT inserted through vocal cords under direct vision, positive ETCO2 and breath sounds checked- equal and bilateral Secured at: 23 cm Tube secured with: Tape Dental Injury: Teeth and Oropharynx as per pre-operative assessment

## 2024-07-05 ENCOUNTER — Encounter: Payer: Self-pay | Admitting: Surgery

## 2024-07-05 NOTE — Anesthesia Postprocedure Evaluation (Signed)
 Anesthesia Post Note  Patient: Elison Worrel Diener  Procedure(s) Performed: HERNIORRHAPHY, INGUINAL, ROBOT-ASSISTED, LAPAROSCOPIC REPAIR, HERNIA, INCISIONAL INSERTION OF MESH  Patient location during evaluation: PACU Anesthesia Type: General Level of consciousness: awake and alert, oriented and patient cooperative Pain management: pain level controlled Vital Signs Assessment: post-procedure vital signs reviewed and stable Respiratory status: spontaneous breathing, nonlabored ventilation and respiratory function stable Cardiovascular status: blood pressure returned to baseline and stable Postop Assessment: adequate PO intake Anesthetic complications: no   There were no known notable events for this encounter.   Last Vitals:  Vitals:   07/04/24 1531 07/04/24 1536  BP: (!) 161/77 137/74  Pulse: 61   Resp: 16   Temp: (!) 36.2 C   SpO2: 97% 98%    Last Pain:  Vitals:   07/04/24 1531  TempSrc: Temporal  PainSc: 0-No pain                 Fermon Ureta

## 2024-07-10 ENCOUNTER — Inpatient Hospital Stay: Attending: Internal Medicine

## 2024-07-10 DIAGNOSIS — C3412 Malignant neoplasm of upper lobe, left bronchus or lung: Secondary | ICD-10-CM | POA: Insufficient documentation

## 2024-07-10 DIAGNOSIS — F17210 Nicotine dependence, cigarettes, uncomplicated: Secondary | ICD-10-CM | POA: Diagnosis not present

## 2024-07-10 LAB — CBC WITH DIFFERENTIAL (CANCER CENTER ONLY)
Abs Immature Granulocytes: 0.04 K/uL (ref 0.00–0.07)
Basophils Absolute: 0.1 K/uL (ref 0.0–0.1)
Basophils Relative: 1 %
Eosinophils Absolute: 0.3 K/uL (ref 0.0–0.5)
Eosinophils Relative: 5 %
HCT: 38.6 % — ABNORMAL LOW (ref 39.0–52.0)
Hemoglobin: 13.4 g/dL (ref 13.0–17.0)
Immature Granulocytes: 1 %
Lymphocytes Relative: 23 %
Lymphs Abs: 1.6 K/uL (ref 0.7–4.0)
MCH: 31.2 pg (ref 26.0–34.0)
MCHC: 34.7 g/dL (ref 30.0–36.0)
MCV: 89.8 fL (ref 80.0–100.0)
Monocytes Absolute: 0.6 K/uL (ref 0.1–1.0)
Monocytes Relative: 8 %
Neutro Abs: 4.5 K/uL (ref 1.7–7.7)
Neutrophils Relative %: 62 %
Platelet Count: 222 K/uL (ref 150–400)
RBC: 4.3 MIL/uL (ref 4.22–5.81)
RDW: 13.7 % (ref 11.5–15.5)
WBC Count: 7.1 K/uL (ref 4.0–10.5)
nRBC: 0 % (ref 0.0–0.2)

## 2024-07-10 LAB — CMP (CANCER CENTER ONLY)
ALT: 11 U/L (ref 0–44)
AST: 13 U/L — ABNORMAL LOW (ref 15–41)
Albumin: 3.9 g/dL (ref 3.5–5.0)
Alkaline Phosphatase: 74 U/L (ref 38–126)
Anion gap: 5 (ref 5–15)
BUN: 22 mg/dL (ref 8–23)
CO2: 30 mmol/L (ref 22–32)
Calcium: 9.6 mg/dL (ref 8.9–10.3)
Chloride: 101 mmol/L (ref 98–111)
Creatinine: 1.18 mg/dL (ref 0.61–1.24)
GFR, Estimated: >60 mL/min (ref >60)
Glucose, Bld: 120 mg/dL — ABNORMAL HIGH (ref 70–99)
Potassium: 3.9 mmol/L (ref 3.5–5.1)
Sodium: 136 mmol/L (ref 135–145)
Total Bilirubin: 0.5 mg/dL (ref 0.0–1.2)
Total Protein: 6.4 g/dL — ABNORMAL LOW (ref 6.5–8.1)

## 2024-07-10 LAB — MAGNESIUM: Magnesium: 2.1 mg/dL (ref 1.7–2.4)

## 2024-07-18 ENCOUNTER — Encounter: Admitting: Physician Assistant

## 2024-07-19 ENCOUNTER — Ambulatory Visit: Admitting: Surgery

## 2024-07-19 ENCOUNTER — Encounter: Payer: Self-pay | Admitting: Surgery

## 2024-07-19 VITALS — BP 125/69 | HR 69 | Ht 71.0 in | Wt 194.0 lb

## 2024-07-19 DIAGNOSIS — Z09 Encounter for follow-up examination after completed treatment for conditions other than malignant neoplasm: Secondary | ICD-10-CM

## 2024-07-19 DIAGNOSIS — K432 Incisional hernia without obstruction or gangrene: Secondary | ICD-10-CM

## 2024-07-19 DIAGNOSIS — K4090 Unilateral inguinal hernia, without obstruction or gangrene, not specified as recurrent: Secondary | ICD-10-CM

## 2024-07-19 DIAGNOSIS — Z8719 Personal history of other diseases of the digestive system: Secondary | ICD-10-CM | POA: Insufficient documentation

## 2024-07-19 NOTE — Patient Instructions (Signed)

## 2024-07-19 NOTE — Progress Notes (Signed)
 [facility] POST-OP OFFICE VISIT  07/19/2024  HPI: Cameron [name] is a 73 y.o. male had surgery on July 04, 2024, now s/p robotic right inguinal hernia repair with repair of incisional ventral hernia.  Mesh was applied for the right inguinal hernia but no mesh was utilized to the ventral incisional hernia.  He has had no complaints of pain postoperatively, has not taken much in the way of pain medications.  Had utilized his abdominal binder early and diminishing so at this point in time.  Vital signs: BP 125/69   Pulse 69   Ht 5' 11 (1.803 m)   Wt 194 lb (88 kg)   SpO2 98%   BMI 27.06 kg/m    Physical Exam: Constitutional: He appears well, Abdomen: Abdomen soft, benign nontender, nondistended.  Right groin without bulge or change on Valsalva. Skin: Incisions with Dermabond covered, clean dry and intact.  Assessment/Plan: This is a 73 y.o. male progressing well following primary ventral hernia repair, and robotic mesh repair of right inguinal hernia.  Progressing well.  Patient Active Problem List   Diagnosis Date Noted   Prediabetes 06/22/2024   Thrush 06/20/2024   Incisional hernia, without obstruction or gangrene 06/19/2024   Right inguinal hernia 06/19/2024   Hypokalemia 03/16/2024   Abnormal CBC 02/23/2024   SIRS (systemic inflammatory response syndrome) (HCC) 02/19/2024   Multiple falls 02/10/2024   Lower extremity edema 02/10/2024   Urinary retention 02/10/2024   Hospital discharge follow-up 02/10/2024   Syncope 02/02/2024   AKI (acute kidney injury) 02/02/2024   Port-A-Cath in place 12/28/2023   Primary squamous cell carcinoma of bronchus of left upper lobe (HCC) 11/10/2023   S/P lobectomy of lung 10/03/2023   Lung nodule 08/15/2023   Acute respiratory infection 06/28/2023   Loss of equilibrium 11/25/2022   Insomnia 11/25/2022   History of polycythemia 11/25/2022   Benign prostatic hyperplasia 07/08/2022   Acute cough 06/23/2022   Upper  respiratory tract infection 06/23/2022   Preventative health care 05/04/2022   Abnormal hemoglobin (Hgb) 04/08/2020   Need for hepatitis C screening test 04/26/2018   Centrilobular emphysema (HCC) 04/20/2018   Polycythemia 04/20/2018   CAD (coronary artery disease) 03/31/2018   Visit for preventive health examination 02/27/2018   Overweight (BMI 25.0-29.9) 04/23/2014   Tobacco dependence 12/29/2012   Health maintenance examination 03/13/2012   Hyperlipemia, mixed 03/13/2012   HTN (hypertension), benign 02/11/2012    - We reviewed limited activity for the sake of his ventral hernia for the next 4 to 6 weeks.  Advised to wear his belt whenever he is anticipating more than lifting than usual.  Will be glad to follow him up as needed.   Honor Leghorn M.D., FACS 07/19/2024, 9:56 AM

## 2024-08-06 ENCOUNTER — Other Ambulatory Visit: Payer: Self-pay

## 2024-08-06 ENCOUNTER — Other Ambulatory Visit (HOSPITAL_COMMUNITY): Payer: Self-pay

## 2024-08-06 ENCOUNTER — Other Ambulatory Visit: Payer: Self-pay | Admitting: Nurse Practitioner

## 2024-08-06 MED ORDER — GABAPENTIN 300 MG PO CAPS
300.0000 mg | ORAL_CAPSULE | Freq: Two times a day (BID) | ORAL | 1 refills | Status: AC
  Filled 2024-08-06: qty 180, 90d supply, fill #0

## 2024-08-16 ENCOUNTER — Telehealth: Payer: Self-pay | Admitting: Nurse Practitioner

## 2024-08-16 NOTE — Telephone Encounter (Signed)
 Copied from CRM #[id number]. Topic: General - Billing Inquiry >> Aug 16, 2024 11:07 AM Thersia BROCKS wrote: Reason for CRM: Patient called in stated bill for 09/24 , stated [facility] sent bill for $64 called for a PAS , got off the phone with insurance, and met out of pocket du put it has a screening [id number] Should [employer] [id number]

## 2024-08-17 NOTE — Telephone Encounter (Signed)
 Pt has reached out to billing. Will await their answer regarding coding.

## 2024-08-28 ENCOUNTER — Ambulatory Visit: Admitting: Family Medicine

## 2024-08-28 ENCOUNTER — Ambulatory Visit
Admission: RE | Admit: 2024-08-28 | Discharge: 2024-08-28 | Disposition: A | Source: Ambulatory Visit | Attending: Family Medicine | Admitting: Family Medicine

## 2024-08-28 ENCOUNTER — Ambulatory Visit: Payer: Self-pay | Admitting: Family Medicine

## 2024-08-28 VITALS — BP 140/80 | HR 73 | Temp 98.4°F | Ht 71.0 in | Wt 192.4 lb

## 2024-08-28 DIAGNOSIS — J22 Unspecified acute lower respiratory infection: Secondary | ICD-10-CM

## 2024-08-28 DIAGNOSIS — B370 Candidal stomatitis: Secondary | ICD-10-CM | POA: Diagnosis not present

## 2024-08-28 DIAGNOSIS — J020 Streptococcal pharyngitis: Secondary | ICD-10-CM | POA: Insufficient documentation

## 2024-08-28 DIAGNOSIS — R051 Acute cough: Secondary | ICD-10-CM

## 2024-08-28 DIAGNOSIS — R0989 Other specified symptoms and signs involving the circulatory and respiratory systems: Secondary | ICD-10-CM | POA: Diagnosis not present

## 2024-08-28 DIAGNOSIS — I700 Atherosclerosis of aorta: Secondary | ICD-10-CM | POA: Diagnosis not present

## 2024-08-28 DIAGNOSIS — C3490 Malignant neoplasm of unspecified part of unspecified bronchus or lung: Secondary | ICD-10-CM | POA: Diagnosis not present

## 2024-08-28 LAB — POCT RAPID STREP A (OFFICE): Rapid Strep A Screen: POSITIVE — AB

## 2024-08-28 MED ORDER — AZITHROMYCIN 250 MG PO TABS: ORAL_TABLET | ORAL | 0 refills | Status: AC

## 2024-08-28 MED ORDER — NYSTATIN 100000 UNIT/ML MT SUSP: 5.0000 mL | Freq: Three times a day (TID) | OROMUCOSAL | 0 refills | Status: DC

## 2024-08-28 NOTE — Assessment & Plan Note (Addendum)
 Mild cough, good O2 sats but with abnormal LLL breath sounds and recent left upper lobectomy for lung cancer.  Check CXR today - LLL haziness ?scarring vs infection. Cover with zpack as per above.

## 2024-08-28 NOTE — Assessment & Plan Note (Signed)
 Exam with evidence of this Rx nystatin  swish and swallow. Reviewed importance of timing advair  inhaler with oral hygiene.

## 2024-08-28 NOTE — Assessment & Plan Note (Signed)
 Marked erythema with white spots to oropharynx RST positive. Treat with azithromycin  Discussed contagious nature of illness.

## 2024-08-28 NOTE — Patient Instructions (Addendum)
 For possible thrush - treat with nystatin  swish and swallow solution sent to pharmacy  after antibotics Chest xray today - we will be in touch with results  Strep test returned positive.  I do want you to take azithromycin  antibiotic which I have sent to your pharmacy.  Push fluids and plenty of rest.  May use tylenol  as needed

## 2024-08-28 NOTE — Progress Notes (Signed)
 Ph: [phone] Fax: [phone]   Patient ID: Cameron [name], male    DOB: Aug 13, 1951, 73 y.o.   MRN: [id number]  This visit was conducted in person.  BP (!) 140/80   Pulse 73   Temp 98.4 F (36.9 C)   Ht 5' 11 (1.803 m)   Wt 192 lb 6.4 oz (87.3 kg)   SpO2 100%   BMI 26.83 kg/m   BP Readings from Last 3 Encounters:  08/28/24 (!) 140/80  07/19/24 125/69  07/04/24 137/74    CC: cough, congestion Subjective:   HPI: [place] PECHACEK is a 73 y.o. male presenting on 08/28/2024 for Acute Visit (Cough, lethargy, sniffles, low grade fevers at home, onset  5 days)   5d h/o productive cough of green mucous, congestion, rhinorrhea, blowing out phlegm, feverish 99.3, lethargy/fatigue, PNdrainage. He's actually started to feel better over last 1-2 days.   No ear or tooth pain, abd pain, nausea, swollen glands, ST.  No sick contacts at home.  Treating with equate cold and flu - discussed this has decongestant which can raise BP.   Current smoker at <1/2 ppd- has h/o COPD on advair  1 puff bid.   H/o prostate cancer s/p prostatectomy 09/2022 H/o lung cancer s/p LUL lobectomy 09/2023 followed by chemotherapy x4 sessions for lung cancer.  Hospitalized May 2025 for urosepsis.     Relevant past medical, surgical, family and social history reviewed and updated as indicated. Interim medical history since our last visit reviewed. Allergies and medications reviewed and updated. Outpatient Medications Prior to Visit  Medication Sig Dispense Refill   atorvastatin  (LIPITOR) 20 MG tablet Take 1 tablet (20 mg total) by mouth daily. 90 tablet 1   Bioflavonoid Products (SUPER-C 1000 PO) Take 1 tablet by mouth in the morning.     fluticasone  (FLONASE ) 50 MCG/ACT nasal spray Place 1 spray into both nostrils 2 (two) times daily as needed for allergies or rhinitis.     fluticasone -salmeterol (ADVAIR  DISKUS) 250-50 MCG/ACT AEPB Inhale 1 puff into the lungs in the morning and at bedtime. 180 each 1    gabapentin  (NEURONTIN ) 300 MG capsule Take 1 capsule (300 mg total) by mouth 2 (two) times daily. 180 capsule 1   hydrochlorothiazide  (MICROZIDE ) 12.5 MG capsule Take 1 capsule (12.5 mg total) by mouth daily. 90 capsule 1   ibuprofen  (ADVIL ) 800 MG tablet Take 1 tablet (800 mg total) by mouth every 8 (eight) hours as needed. 30 tablet 0   lidocaine -prilocaine  (EMLA ) cream Apply to the Port-A-Cath site 30-60 minutes before chemotherapy (Patient taking differently: Apply 1 Application topically as needed (for port access).) 30 g 0   Multiple Vitamin (MULTIVITAMIN WITH MINERALS) TABS tablet Take 1 tablet by mouth daily.     valACYclovir  (VALTREX ) 1000 MG tablet Take 1 tablet (1,000 mg total) by mouth 2 (two) times daily for 3 days. Start within 72 hours of the first signs of a flare. (Patient taking differently: Take 500 mg by mouth daily as needed (outset of flare.).) 18 tablet 2   No facility-administered medications prior to visit.     Per HPI unless specifically indicated in ROS section below Review of Systems  Objective:  BP (!) 140/80   Pulse 73   Temp 98.4 F (36.9 C)   Ht 5' 11 (1.803 m)   Wt 192 lb 6.4 oz (87.3 kg)   SpO2 100%   BMI 26.83 kg/m   Wt Readings from Last 3 Encounters:  08/28/24 192 lb 6.4  oz (87.3 kg)  07/19/24 194 lb (88 kg)  06/20/24 190 lb 12.8 oz (86.5 kg)      Physical Exam Vitals and nursing note reviewed.  Constitutional:      Appearance: Normal appearance. He is not ill-appearing.  HENT:     Head: Normocephalic and atraumatic.     Right Ear: Hearing, tympanic membrane, ear canal and external ear normal. There is no impacted cerumen.     Left Ear: Hearing, tympanic membrane, ear canal and external ear normal. There is no impacted cerumen.     Nose: Congestion present. No mucosal edema or rhinorrhea.     Right Turbinates: Not enlarged or swollen.     Left Turbinates: Not enlarged or swollen.     Right Sinus: No maxillary sinus tenderness or frontal  sinus tenderness.     Left Sinus: No maxillary sinus tenderness or frontal sinus tenderness.     Mouth/Throat:     Mouth: Mucous membranes are moist.     Pharynx: Posterior oropharyngeal erythema present. No oropharyngeal exudate or postnasal drip.     Tonsils: No tonsillar exudate.     Comments: White spots to anterior oropharynx  Eyes:     Extraocular Movements: Extraocular movements intact.     Conjunctiva/sclera: Conjunctivae normal.     Pupils: Pupils are equal, round, and reactive to light.  Cardiovascular:     Rate and Rhythm: Normal rate and regular rhythm.     Pulses: Normal pulses.     Heart sounds: Normal heart sounds. No murmur heard. Pulmonary:     Effort: Pulmonary effort is normal. No respiratory distress.     Breath sounds: Rhonchi and rales (few to LLL) present. No wheezing.     Comments:  LLL coarse crackles with decreased breath sounds Musculoskeletal:     Cervical back: Normal range of motion and neck supple. No rigidity.     Right lower leg: No edema.     Left lower leg: No edema.  Lymphadenopathy:     Cervical: No cervical adenopathy.  Skin:    General: Skin is warm and dry.     Findings: No rash.  Neurological:     Mental Status: He is alert.  Psychiatric:        Mood and Affect: Mood normal.        Behavior: Behavior normal.       Results for orders placed or performed in visit on 08/28/24  POCT rapid strep A   Collection Time: 08/28/24  4:43 PM  Result Value Ref Range   Rapid Strep A Screen Positive (A) Negative   DG Chest 2 View CLINICAL DATA:  Left lower lobe rales. History of left upper lobectomy for lung cancer. Congestion.  EXAM: CHEST - 2 VIEW  COMPARISON:  Radiograph 02/18/2024, CT 05/23/2024  FINDINGS: Right chest port with tip in the SVC. Chronic postsurgical volume loss in the left hemithorax. Chronic blunting of left costophrenic angle and scarring at the left lung base. No acute airspace disease. Normal heart size with  stable mediastinal contours. Aortic atherosclerosis. No pulmonary edema. No pneumothorax. On limited assessment, no acute osseous findings.  IMPRESSION: Chronic postsurgical volume loss in the left hemithorax with scarring at the left lung base. No acute findings.  Electronically Signed   By: Andrea Gasman M.D.   On: 08/28/2024 16:44  Assessment & Plan:   Problem List Items Addressed This Visit     Acute cough   Mild cough, good O2 sats but with  abnormal LLL breath sounds and recent left upper lobectomy for lung cancer.  Check CXR today - LLL haziness ?scarring vs infection. Cover with zpack as per above.       Relevant Orders   DG Chest 2 View (Completed)   Acute respiratory infection   Relevant Medications   nystatin  (MYCOSTATIN ) 100000 UNIT/ML suspension   azithromycin  (ZITHROMAX ) 250 MG tablet   Other Relevant Orders   POCT rapid strep A (Completed)   Thrush   Exam with evidence of this Rx nystatin  swish and swallow. Reviewed importance of timing advair  inhaler with oral hygiene.       Relevant Medications   nystatin  (MYCOSTATIN ) 100000 UNIT/ML suspension   azithromycin  (ZITHROMAX ) 250 MG tablet   Acute streptococcal pharyngitis - Primary   Marked erythema with white spots to oropharynx RST positive. Treat with azithromycin  Discussed contagious nature of illness.       Relevant Medications   nystatin  (MYCOSTATIN ) 100000 UNIT/ML suspension   azithromycin  (ZITHROMAX ) 250 MG tablet     Meds ordered this encounter  Medications   nystatin  (MYCOSTATIN ) 100000 UNIT/ML suspension    Sig: Take 5 mLs (500,000 Units total) by mouth 3 (three) times daily.    Dispense:  120 mL    Refill:  0   azithromycin  (ZITHROMAX ) 250 MG tablet    Sig: Take 2 tablets on day 1, then 1 tablet daily on days 2 through 5    Dispense:  6 tablet    Refill:  0    Orders Placed This Encounter  Procedures   DG Chest 2 View    Standing Status:   Future    Number of Occurrences:    1    Expiration Date:   08/28/2025    Reason for Exam (SYMPTOM  OR DIAGNOSIS REQUIRED):   LLL rales, h/o LUL lobectomy for lung cancer, 5d congestion    Preferred imaging location?:   [facility]   POCT rapid strep A    Patient Instructions  For possible thrush - treat with nystatin  swish and swallow solution sent to pharmacy  after antibotics Chest xray today - we will be in touch with results  Strep test returned positive.  I do want you to take azithromycin  antibiotic which I have sent to your pharmacy.  Push fluids and plenty of rest.  May use tylenol  as needed  Follow up plan: Return if symptoms worsen or fail to improve.  Anton Blas, MD

## 2024-09-04 NOTE — Telephone Encounter (Unsigned)
 Copied from CRM [id number]. Topic: General - Billing Inquiry >> Sep 04, 2024 10:01 AM Alexandria E wrote: Reason for CRM: Patient was seen on 9/24 by Dr. Wendee and received a [phone] bill for this visit, patient is confused as to why he has to pay as he met is out-of-pocket deductible for the year. Patient also stated that some coding had to be changed from that visit so he is following up to see if it has been changed.

## 2024-09-11 ENCOUNTER — Encounter: Payer: Self-pay | Admitting: Internal Medicine

## 2024-09-14 ENCOUNTER — Encounter: Payer: Self-pay | Admitting: Internal Medicine

## 2024-09-17 ENCOUNTER — Other Ambulatory Visit: Payer: Self-pay

## 2024-09-17 ENCOUNTER — Emergency Department (HOSPITAL_COMMUNITY)

## 2024-09-17 ENCOUNTER — Ambulatory Visit: Payer: Self-pay | Admitting: *Deleted

## 2024-09-17 ENCOUNTER — Encounter (HOSPITAL_COMMUNITY): Payer: Self-pay

## 2024-09-17 ENCOUNTER — Encounter: Payer: Self-pay | Admitting: Internal Medicine

## 2024-09-17 ENCOUNTER — Emergency Department (HOSPITAL_COMMUNITY)
Admission: EM | Admit: 2024-09-17 | Discharge: 2024-09-17 | Disposition: A | Discharge status: Home / Self Care | Attending: Emergency Medicine | Admitting: Emergency Medicine

## 2024-09-17 DIAGNOSIS — J449 Chronic obstructive pulmonary disease, unspecified: Secondary | ICD-10-CM | POA: Insufficient documentation

## 2024-09-17 DIAGNOSIS — I2510 Atherosclerotic heart disease of native coronary artery without angina pectoris: Secondary | ICD-10-CM | POA: Insufficient documentation

## 2024-09-17 DIAGNOSIS — R5381 Other malaise: Secondary | ICD-10-CM | POA: Insufficient documentation

## 2024-09-17 DIAGNOSIS — Z760 Encounter for issue of repeat prescription: Secondary | ICD-10-CM | POA: Diagnosis not present

## 2024-09-17 DIAGNOSIS — Z8546 Personal history of malignant neoplasm of prostate: Secondary | ICD-10-CM | POA: Insufficient documentation

## 2024-09-17 DIAGNOSIS — C3490 Malignant neoplasm of unspecified part of unspecified bronchus or lung: Secondary | ICD-10-CM | POA: Insufficient documentation

## 2024-09-17 DIAGNOSIS — Z8249 Family history of ischemic heart disease and other diseases of the circulatory system: Secondary | ICD-10-CM | POA: Insufficient documentation

## 2024-09-17 DIAGNOSIS — R000 Tachycardia, unspecified: Secondary | ICD-10-CM | POA: Diagnosis not present

## 2024-09-17 DIAGNOSIS — Z79899 Other long term (current) drug therapy: Secondary | ICD-10-CM | POA: Diagnosis not present

## 2024-09-17 DIAGNOSIS — N3000 Acute cystitis without hematuria: Secondary | ICD-10-CM | POA: Diagnosis not present

## 2024-09-17 DIAGNOSIS — I700 Atherosclerosis of aorta: Secondary | ICD-10-CM | POA: Insufficient documentation

## 2024-09-17 DIAGNOSIS — R2681 Unsteadiness on feet: Secondary | ICD-10-CM | POA: Diagnosis present

## 2024-09-17 LAB — COMPREHENSIVE METABOLIC PANEL WITH GFR
ALT: 19 U/L (ref 0–44)
AST: 27 U/L (ref 15–41)
Albumin: 4.4 g/dL (ref 3.5–5.0)
Alkaline Phosphatase: 98 U/L (ref 38–126)
Anion gap: 10 (ref 5–15)
BUN: 14 mg/dL (ref 8–23)
CO2: 27 mmol/L (ref 22–32)
Calcium: 9.5 mg/dL (ref 8.9–10.3)
Chloride: 99 mmol/L (ref 98–111)
Creatinine, Ser: 1.08 mg/dL (ref 0.61–1.24)
GFR, Estimated: >60 mL/min
Glucose, Bld: 97 mg/dL (ref 70–99)
Potassium: 4.3 mmol/L (ref 3.5–5.1)
Sodium: 136 mmol/L (ref 135–145)
Total Bilirubin: 0.3 mg/dL (ref 0.0–1.2)
Total Protein: 7.1 g/dL (ref 6.5–8.1)

## 2024-09-17 LAB — CBC
HCT: 43.1 % (ref 39.0–52.0)
Hemoglobin: 14.5 g/dL (ref 13.0–17.0)
MCH: 31.3 pg (ref 26.0–34.0)
MCHC: 33.6 g/dL (ref 30.0–36.0)
MCV: 93.1 fL (ref 80.0–100.0)
Platelets: 203 K/uL (ref 150–400)
RBC: 4.63 MIL/uL (ref 4.22–5.81)
RDW: 14.6 % (ref 11.5–15.5)
WBC: 6.5 K/uL (ref 4.0–10.5)
nRBC: 0 % (ref 0.0–0.2)

## 2024-09-17 LAB — URINE DRUG SCREEN
Amphetamines: NEGATIVE
Barbiturates: NEGATIVE
Benzodiazepines: NEGATIVE
Cocaine: NEGATIVE
Fentanyl: NEGATIVE
Methadone Scn, Ur: NEGATIVE
Opiates: NEGATIVE
Tetrahydrocannabinol: NEGATIVE

## 2024-09-17 LAB — URINALYSIS, ROUTINE W REFLEX MICROSCOPIC
Bilirubin Urine: NEGATIVE
Glucose, UA: NEGATIVE mg/dL
Hgb urine dipstick: NEGATIVE
Ketones, ur: NEGATIVE mg/dL
Nitrite: POSITIVE — AB
Protein, ur: NEGATIVE mg/dL
RBC / HPF: >50 RBC/hpf (ref 0–5)
Specific Gravity, Urine: 1.043 — ABNORMAL HIGH (ref 1.005–1.030)
WBC, UA: >50 WBC/hpf (ref 0–5)
pH: 5 (ref 5.0–8.0)

## 2024-09-17 LAB — DIFFERENTIAL
Abs Immature Granulocytes: 0.03 K/uL (ref 0.00–0.07)
Basophils Absolute: 0 K/uL (ref 0.0–0.1)
Basophils Relative: 0 %
Eosinophils Absolute: 0.1 K/uL (ref 0.0–0.5)
Eosinophils Relative: 1 %
Immature Granulocytes: 1 %
Lymphocytes Relative: 6 %
Lymphs Abs: 0.4 K/uL — ABNORMAL LOW (ref 0.7–4.0)
Monocytes Absolute: 0.9 K/uL (ref 0.1–1.0)
Monocytes Relative: 14 %
Neutro Abs: 5.1 K/uL (ref 1.7–7.7)
Neutrophils Relative %: 78 %

## 2024-09-17 LAB — PROTIME-INR
INR: 1.2 (ref 0.8–1.2)
Prothrombin Time: 15.4 s — ABNORMAL HIGH (ref 11.4–15.2)

## 2024-09-17 LAB — APTT: aPTT: 38 s — ABNORMAL HIGH (ref 24–36)

## 2024-09-17 LAB — ETHANOL: Alcohol, Ethyl (B): <15 mg/dL

## 2024-09-17 LAB — CBG MONITORING, ED: Glucose-Capillary: 79 mg/dL (ref 70–99)

## 2024-09-17 MED ORDER — CEFUROXIME AXETIL 500 MG PO TABS
500.0000 mg | ORAL_TABLET | Freq: Once | ORAL | Status: AC
  Administered 2024-09-17: 500 mg via ORAL
  Filled 2024-09-17: qty 1

## 2024-09-17 MED ORDER — IOHEXOL 350 MG/ML SOLN
75.0000 mL | Freq: Once | INTRAVENOUS | Status: AC | PRN
  Administered 2024-09-17: 75 mL via INTRAVENOUS

## 2024-09-17 MED ORDER — CEFUROXIME AXETIL 500 MG PO TABS
500.0000 mg | ORAL_TABLET | Freq: Two times a day (BID) | ORAL | 0 refills | Status: DC
  Filled 2024-09-17: qty 10, 5d supply, fill #0

## 2024-09-17 MED ORDER — HEPARIN SOD (PORK) LOCK FLUSH 100 UNIT/ML IV SOLN
INTRAVENOUS | Status: AC
  Administered 2024-09-17: 500 [IU]
  Filled 2024-09-17: qty 5

## 2024-09-17 MED FILL — Heparin Sodium (Porcine) Lock Flush IV Soln 100 Unit/ML: 500.0000 [IU] | INTRAVENOUS | Qty: 5 | Status: AC

## 2024-09-17 NOTE — ED Notes (Signed)
 [facility] delayed; pt going to scanner
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
## 2024-09-17 NOTE — Telephone Encounter (Signed)
 Agree with disposition.

## 2024-09-17 NOTE — ED Notes (Signed)
 Provider notified to perform stroke screen in triage.
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
## 2024-09-17 NOTE — ED Notes (Signed)
 Pt axox4. GCS 15. Pt and spouse of patient verbalize understanding of discharge instructions and follow up. Pt ambulated out of er with steady gait to transportation home with spouse

## 2024-09-17 NOTE — ED Notes (Signed)
 Pt axox4. GCS 15. Pt ambulated in hallway without assistance.  Noted steady gait.  Notified provider via secure chat.  No respiratory distress noted.  Spouse of patient at bedside
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
## 2024-09-17 NOTE — ED Notes (Signed)
 Dr. Freddi at bedside in triage

## 2024-09-17 NOTE — ED Provider Notes (Signed)
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 comments:    Maybe 1-1.5 cigarettes per day since 09/09/23  Vaping Use   Vaping status: Never Used  Substance Use Topics   Alcohol use: Not Currently   Drug use: No     Elnor Jayson LABOR, DO 09/17/24 2140  "

## 2024-09-17 NOTE — Discharge Instructions (Addendum)
 It was a pleasure caring for you today in the emergency department.  Your workup today is revealing for urinary tract faction.  Please take antibiotics as prescribed.  Please follow-up with your PCP in the next week for recheck and ensure improvement of your symptoms. If you start having any vomiting, fever, severe pain, or any worsening or worrisome symptoms please return to the emergency department. Otherwise follow up with your primary care

## 2024-09-17 NOTE — ED Notes (Signed)
 Dr. Freddi states no code stroke

## 2024-09-18 ENCOUNTER — Inpatient Hospital Stay: Attending: Internal Medicine

## 2024-09-18 ENCOUNTER — Ambulatory Visit (HOSPITAL_COMMUNITY)
Admission: RE | Admit: 2024-09-18 | Discharge: 2024-09-18 | Disposition: A | Discharge status: Home / Self Care | Source: Ambulatory Visit | Attending: Internal Medicine | Admitting: Internal Medicine

## 2024-09-18 ENCOUNTER — Other Ambulatory Visit (HOSPITAL_COMMUNITY): Payer: Self-pay

## 2024-09-18 DIAGNOSIS — C3490 Malignant neoplasm of unspecified part of unspecified bronchus or lung: Secondary | ICD-10-CM

## 2024-09-18 DIAGNOSIS — Z87891 Personal history of nicotine dependence: Secondary | ICD-10-CM | POA: Diagnosis not present

## 2024-09-18 DIAGNOSIS — Z79899 Other long term (current) drug therapy: Secondary | ICD-10-CM | POA: Diagnosis not present

## 2024-09-18 DIAGNOSIS — Z9221 Personal history of antineoplastic chemotherapy: Secondary | ICD-10-CM | POA: Insufficient documentation

## 2024-09-18 DIAGNOSIS — C3411 Malignant neoplasm of upper lobe, right bronchus or lung: Secondary | ICD-10-CM | POA: Diagnosis present

## 2024-09-18 DIAGNOSIS — Z902 Acquired absence of lung [part of]: Secondary | ICD-10-CM | POA: Insufficient documentation

## 2024-09-18 LAB — MAGNESIUM: Magnesium: 2 mg/dL (ref 1.7–2.4)

## 2024-09-18 MED ORDER — IOHEXOL 300 MG/ML  SOLN
75.0000 mL | Freq: Once | INTRAMUSCULAR | Status: AC | PRN
  Administered 2024-09-18: 75 mL via INTRAVENOUS

## 2024-09-20 LAB — URINE CULTURE: Culture: >=100000 — AB

## 2024-09-21 ENCOUNTER — Telehealth (HOSPITAL_BASED_OUTPATIENT_CLINIC_OR_DEPARTMENT_OTHER): Payer: Self-pay

## 2024-09-21 ENCOUNTER — Ambulatory Visit: Payer: Self-pay

## 2024-09-21 ENCOUNTER — Other Ambulatory Visit (HOSPITAL_COMMUNITY): Payer: Self-pay

## 2024-09-21 ENCOUNTER — Encounter: Payer: Self-pay | Admitting: Family Medicine

## 2024-09-21 ENCOUNTER — Ambulatory Visit (INDEPENDENT_AMBULATORY_CARE_PROVIDER_SITE_OTHER): Admitting: Family Medicine

## 2024-09-21 VITALS — BP 136/72 | HR 75 | Temp 98.1°F | Ht 71.0 in | Wt 184.4 lb

## 2024-09-21 DIAGNOSIS — C3412 Malignant neoplasm of upper lobe, left bronchus or lung: Secondary | ICD-10-CM

## 2024-09-21 DIAGNOSIS — D849 Immunodeficiency, unspecified: Secondary | ICD-10-CM

## 2024-09-21 DIAGNOSIS — N3944 Nocturnal enuresis: Secondary | ICD-10-CM | POA: Diagnosis not present

## 2024-09-21 DIAGNOSIS — B965 Pseudomonas (aeruginosa) (mallei) (pseudomallei) as the cause of diseases classified elsewhere: Secondary | ICD-10-CM | POA: Diagnosis not present

## 2024-09-21 DIAGNOSIS — N390 Urinary tract infection, site not specified: Secondary | ICD-10-CM

## 2024-09-21 DIAGNOSIS — B370 Candidal stomatitis: Secondary | ICD-10-CM | POA: Diagnosis not present

## 2024-09-21 DIAGNOSIS — J22 Unspecified acute lower respiratory infection: Secondary | ICD-10-CM

## 2024-09-21 MED ORDER — CIPROFLOXACIN HCL 500 MG PO TABS
500.0000 mg | ORAL_TABLET | Freq: Two times a day (BID) | ORAL | 0 refills | Status: AC
  Filled 2024-09-21: qty 10, 5d supply, fill #0

## 2024-09-21 MED ORDER — NYSTATIN 100000 UNIT/ML MT SUSP: 5.0000 mL | Freq: Three times a day (TID) | OROMUCOSAL | 0 refills | Status: AC

## 2024-09-21 NOTE — Patient Instructions (Addendum)
 Redo nystatin  swish and swallow course for mild persistent thrush, as you're back on antibiotic.  Take cipro  500mg  bid 5 day course.  Drink plenty of water  Check with Dr Renda about repeat urine testing, Pure Leo.

## 2024-09-21 NOTE — Telephone Encounter (Signed)
 FYI Only or Action Required?: FYI only for provider: appointment scheduled on 09/21/2024 12/23/225 at 2:30pm with Dr Anton Blas at PCP office.  Patient was last seen in primary care on 08/28/2024 by Blas Anton, MD.  Called Nurse Triage reporting Urinary Tract Infection.  Symptoms began 09/17/2024.  Interventions attempted: Prescription medications: Ceftin  500mg  tablet--one tablet twice a day, Rest, hydration, or home remedies, and Other: went to the ER 09/17/2024.  Symptoms are: wife states slightly better but not resolving.  Triage Disposition: See PCP When Office is Open (Within 3 Days)  Patient/caregiver understands and will follow disposition?: Yes         Copied from CRM [id number]. Topic: Clinical - Red Word Triage >> Sep 21, 2024  7:48 AM Deleta RAMAN wrote: Red Word that prompted transfer to Nurse Triage: patient wife states he has a bad uti and is not on the correct the medication. Seen in the ed on 12/22 Reason for Disposition  Diabetes mellitus or weak immune system (e.g., HIV positive, cancer chemo, splenectomy, organ transplant, chronic steroids)  Answer Assessment - Initial Assessment Questions Patient's wife calling for the patient Patient is a Cancer patient  Patient was all over the road driving Monday [address] and patient went to the Emergency Room for evaluation  Patient urinated in the bed last night Wife states that the patient is not on the right antibiotic--she states she called the ER yesterday and she was told to call the patient's PCP cefUROXime  (CEFTIN ) 500 MG tablet is what he has been taking but symptoms are not resolving and wife states this is the wrong antibiotic for his UTI  Patient drinking cranberry juice and water  Wife denies the patient having any known fevers & denies any known pain  Patient is advised to call us  back if anything changes or with any further questions/concerns. Patient is advised that if anything worsens to go to the  Emergency Room. Patient verbalized understanding.  Protocols used: Urinary Tract Infection on Antibiotic Follow-up Call - Male-[facility]

## 2024-09-21 NOTE — Telephone Encounter (Signed)
 Per chart review. Patient has received call from ED today with abx change. And recommendations. Please advise if any further action needed from our office.

## 2024-09-21 NOTE — Telephone Encounter (Signed)
 Post ED Visit - Positive Culture Follow-up: Successful Patient Follow-Up  Culture assessed and recommendations reviewed by:  [x]  Rankin Dee, Pharm.D. []  Venetia Gully, Pharm.D., BCPS AQ-ID []  Garrel Crews, Pharm.D., BCPS []  Almarie Lunger, [address].D., BCPS []  [place], [place].D., BCPS, AAHIVP []  Rosaline Bihari, Pharm.D., BCPS, AAHIVP []  Vernell Meier, PharmD, BCPS []  Latanya Hint, PharmD, BCPS []  Donald Medley, PharmD, BCPS []  Rocky Bold, PharmD  Positive urine culture  []  Patient discharged without antimicrobial prescription and treatment is now indicated [x]  Organism is resistant to prescribed ED discharge antimicrobial []  Patient with positive blood cultures  Changes discussed with ED provider: Richerd Later, DO New antibiotic prescription Ciprofloxacin  500 mg po BUID x 5 days Called to [facility] outpt pharmacy  Contacted patient, date 09/21/24, time 9:32 am   Ruth Camelia Elbe 09/21/2024, 9:39 AM

## 2024-09-21 NOTE — Progress Notes (Signed)
 " Ph: [phone] Fax: [phone]   Patient ID: Cameron [name], male    DOB: [birth year], 73 y.o.   MRN: [id number]  This visit was conducted in person.  BP 136/72 (BP Location: Right Arm, Cuff Size: Normal)   Pulse 75   Temp 98.1 F (36.7 C) (Oral)   Ht 5' 11 (1.803 m)   Wt 184 lb 6.4 oz (83.6 kg)   SpO2 98%   BMI 25.72 kg/m    CC: ER f/u visit  Subjective:   HPI: Cameron [name] is a 73 y.o. male presenting on 09/21/2024 for Hospitalization Follow-up [facility] FU has Dx with Acute cystitis without hematuria //Also sneezing, cough, runny nose, congestion, onset around 12/21/Pt's wife also reports pt has started talking and laughing in sleep and is concerned)   Recent ER visit for AMS, confusion, imbalance, uncoordinated driving.  Workup included brain MRI, CT angio head/neck, CT head.   Found to have UTI, treated with cefuroxime  500mg  bid. This was changed to ciprofloxacin  50mg  bid 5d course after initial sensitivities returned growing >100k pseudomonas.   Since home, denies fevers/chills, never dysuria, abd pain, vomiting, nausea. Notes loss of groin sensation since prostate surgery. Notes ongoing urinary urgency and frequency since prostate surgery.   He has been using depends and pads at night time - he's soaking through this overnight - asks about PureWick system.   Also with 1 wk of runny nose, congestion. Wife thought this may be related to allergies.   Current smoker at <1/2 ppd- has h/o COPD on advair  1 puff bid.  H/o prostate cancer s/p prostatectomy 09/2022 H/o lung cancer s/p LUL lobectomy 09/2023 followed by chemotherapy x4 sessions for lung cancer.  Hospitalized May 2025 for urosepsis.   Next onc appt (Muhammed) is next week.  Next urology appt Raynelle) is first week of January.      Relevant past medical, surgical, family and social history reviewed and updated as indicated. Interim medical history since our last visit reviewed. Allergies and medications  reviewed and updated. Outpatient Medications Prior to Visit  Medication Sig Dispense Refill   atorvastatin  (LIPITOR) 20 MG tablet Take 1 tablet (20 mg total) by mouth daily. 90 tablet 1   Bioflavonoid Products (SUPER-C 1000 PO) Take 1 tablet by mouth in the morning.     fluticasone  (FLONASE ) 50 MCG/ACT nasal spray Place 1 spray into both nostrils 2 (two) times daily as needed for allergies or rhinitis.     fluticasone -salmeterol (ADVAIR  DISKUS) 250-50 MCG/ACT AEPB Inhale 1 puff into the lungs in the morning and at bedtime. 180 each 1   gabapentin  (NEURONTIN ) 300 MG capsule Take 1 capsule (300 mg total) by mouth 2 (two) times daily. 180 capsule 1   hydrochlorothiazide  (MICROZIDE ) 12.5 MG capsule Take 1 capsule (12.5 mg total) by mouth daily. 90 capsule 1   lidocaine -prilocaine  (EMLA ) cream Apply to the Port-A-Cath site 30-60 minutes before chemotherapy 30 g 0   Multiple Vitamin (MULTIVITAMIN WITH MINERALS) TABS tablet Take 1 tablet by mouth daily.     valACYclovir  (VALTREX ) 1000 MG tablet Take 1 tablet (1,000 mg total) by mouth 2 (two) times daily for 3 days. Start within 72 hours of the first signs of a flare. (Patient taking differently: Take 500 mg by mouth daily as needed (outset of flare.).) 18 tablet 2   nystatin  (MYCOSTATIN ) 100000 UNIT/ML suspension Take 5 mLs (500,000 Units total) by mouth 3 (three) times daily. 120 mL 0   ciprofloxacin  (CIPRO ) 500 MG tablet  Take 1 tablet (500 mg total) by mouth 2 (two) times daily for 5 days. (Patient not taking: Reported on 09/21/2024) 10 tablet 0   ibuprofen  (ADVIL ) 800 MG tablet Take 1 tablet (800 mg total) by mouth every 8 (eight) hours as needed. (Patient not taking: Reported on 09/21/2024) 30 tablet 0   cefUROXime  (CEFTIN ) 500 MG tablet Take 1 tablet (500 mg total) by mouth 2 (two) times daily with a meal. (Patient not taking: Reported on 09/21/2024) 10 tablet 0   No facility-administered medications prior to visit.     Per HPI unless specifically  indicated in ROS section below Review of Systems  Objective:  BP 136/72 (BP Location: Right Arm, Cuff Size: Normal)   Pulse 75   Temp 98.1 F (36.7 C) (Oral)   Ht 5' 11 (1.803 m)   Wt 184 lb 6.4 oz (83.6 kg)   SpO2 98%   BMI 25.72 kg/m   Wt Readings from Last 3 Encounters:  09/21/24 184 lb 6.4 oz (83.6 kg)  09/17/24 185 lb (83.9 kg)  08/28/24 192 lb 6.4 oz (87.3 kg)      Physical Exam Vitals and nursing note reviewed.  Constitutional:      Appearance: Normal appearance. He is not ill-appearing.  HENT:     Head: Normocephalic and atraumatic.     Right Ear: Hearing, tympanic membrane, ear canal and external ear normal. There is no impacted cerumen.     Left Ear: Hearing, tympanic membrane, ear canal and external ear normal. There is no impacted cerumen.     Nose: Congestion and rhinorrhea present. No mucosal edema.     Right Turbinates: Not enlarged or swollen.     Left Turbinates: Not enlarged or swollen.     Right Sinus: No maxillary sinus tenderness or frontal sinus tenderness.     Left Sinus: No maxillary sinus tenderness or frontal sinus tenderness.     Mouth/Throat:     Mouth: Mucous membranes are moist.     Palate: Lesions present.     Pharynx: Oropharynx is clear. No oropharyngeal exudate or posterior oropharyngeal erythema.     Comments: Few white spots to right posterior soft palate - improved from prior Eyes:     Extraocular Movements: Extraocular movements intact.     Conjunctiva/sclera: Conjunctivae normal.     Pupils: Pupils are equal, round, and reactive to light.  Cardiovascular:     Rate and Rhythm: Normal rate and regular rhythm.     Pulses: Normal pulses.     Heart sounds: Normal heart sounds. No murmur heard. Pulmonary:     Effort: Pulmonary effort is normal. No respiratory distress.     Breath sounds: Normal breath sounds. No wheezing, rhonchi or rales.     Comments: Lungs clear  Musculoskeletal:     Cervical back: Normal range of motion and neck  supple. No rigidity.     Right lower leg: No edema.     Left lower leg: No edema.  Lymphadenopathy:     Cervical: No cervical adenopathy.  Skin:    General: Skin is warm and dry.     Findings: No rash.  Neurological:     Mental Status: He is alert.  Psychiatric:        Mood and Affect: Mood normal.        Behavior: Behavior normal.       Results for orders placed or performed in visit on 09/18/24  Magnesium    Collection Time: 09/18/24  9:01 AM  Result Value Ref Range   Magnesium  2.0 1.7 - 2.4 mg/dL   Lab Results  Component Value Date   WBC 6.5 09/17/2024   HGB 14.5 09/17/2024   HCT 43.1 09/17/2024   MCV 93.1 09/17/2024   PLT 203 09/17/2024    Lab Results  Component Value Date   NA 136 09/17/2024   CL 99 09/17/2024   K 4.3 09/17/2024   CO2 27 09/17/2024   BUN 14 09/17/2024   CREATININE 1.08 09/17/2024   GFRNONAA >60 09/17/2024   CALCIUM  9.5 09/17/2024   ALBUMIN  4.4 09/17/2024   GLUCOSE 97 09/17/2024    Lab Results  Component Value Date   ALT 19 09/17/2024   AST 27 09/17/2024   ALKPHOS 98 09/17/2024   BILITOT 0.3 09/17/2024    Assessment & Plan:   Problem List Items Addressed This Visit     Acute respiratory infection   Suspect viral. Lungs clear, good o2 sats, afebrile.  Received zpack earlier this month for strep throat, and now currently transitioning from Ceftin  course to Cipro  5d course to treat UTI.  Supportive measures reviewed  He is immunocompromised. Close monitoring indicated - update if new or worsening symptoms.       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       Thrush   Largely improved, however with some persistence to right posterior soft palate  He has been on several antibiotics in the interim. Also reviewed timing  nystatin  with oral hygiene measures  Will refill nystatin  swish/swallow solution . Pt largely asymptomatic.       Relevant Medications   nystatin  (MYCOSTATIN ) 100000 UNIT/ML suspension   Urinary tract infection due to Pseudomonas aeruginosa - Primary   Recent ER visit for UTI presenting with AMS.  Head imaging, labs otherwise overall reassuring. UCx grew >100k pseudomonas.  Plan is 5 days of ciprofloxacin  500mg  bid.  Update if persistent or worsening symptoms.       Relevant Medications   nystatin  (MYCOSTATIN ) 100000 UNIT/ML suspension   Immunocompromised state   Currently on cycle 3/4 chemotherapy for lung cancer.       Urinary incontinence   Ongoing nocturnal accidents - soaking through pad and depends.  They ask about PureWick system - reviewing website, this is not covered by insurance. Recommend they touch base with urology on their recommendations first at upcoming appt next week         Meds ordered this encounter  Medications   nystatin  (MYCOSTATIN ) 100000 UNIT/ML suspension    Sig: Take 5 mLs (500,000 Units total) by mouth 3 (three) times daily.    Dispense:  120 mL    Refill:  0    No orders of the defined types were placed in this encounter.   Patient Instructions  Redo nystatin  swish and swallow course for mild persistent thrush, as you're back on antibiotic.  Take cipro  500mg  bid 5 day course.  Drink plenty of water  Check with Dr Renda about repeat urine testing, Pure Leo.   Follow up plan: No follow-ups on file.  Anton Blas, MD   "

## 2024-09-21 NOTE — Telephone Encounter (Signed)
 Noted. No changes needed currently

## 2024-09-21 NOTE — Progress Notes (Signed)
 ED Antimicrobial Stewardship Positive Culture Follow Up   Cameron [name] is an 73 y.o. male who presented to [facility] on 09/17/2024 with a chief complaint of  Chief Complaint  Patient presents with   Balance Problem    Recent Results (from the past 720 hours)  Urine Culture     Status: Abnormal   Collection Time: 09/17/24  8:17 PM   Specimen: Urine, Clean Catch  Result Value Ref Range Status   Specimen Description   Final    URINE, CLEAN CATCH Performed at [facility], [address] [place], [place] [address]    Special Requests   Final    NONE Performed at [facility] [address], [place], [place] [address]    Culture >=100,000 COLONIES/mL PSEUDOMONAS AERUGINOSA (A)  Final   Report Status 09/20/2024 FINAL  Final   Organism ID, Bacteria PSEUDOMONAS AERUGINOSA (A)  Final      Susceptibility   Pseudomonas aeruginosa - MIC*    MEROPENEM 1 SENSITIVE Sensitive     CIPROFLOXACIN  <=0.06 SENSITIVE Sensitive     IMIPENEM <=0.5 SENSITIVE Sensitive     PIP/TAZO Value in next row Sensitive      8 SENSITIVEThis is a modified FDA-approved test that has been validated and its performance characteristics determined by the reporting laboratory.  This laboratory is certified under the Clinical Laboratory Improvement Amendments CLIA as qualified to perform high complexity clinical laboratory testing.    CEFEPIME  Value in next row Sensitive      8 SENSITIVEThis is a modified FDA-approved test that has been validated and its performance characteristics determined by the reporting laboratory.  This laboratory is certified under the Clinical Laboratory Improvement Amendments CLIA as qualified to perform high complexity clinical laboratory testing.    CEFTAZIDIME/AVIBACTAM Value in next row Sensitive      8 SENSITIVEThis is a modified FDA-approved test that has been validated and its performance characteristics determined by the reporting laboratory.  This  laboratory is certified under the Clinical Laboratory Improvement Amendments CLIA as qualified to perform high complexity clinical laboratory testing.    CEFTOLOZANE/TAZOBACTAM Value in next row Sensitive      8 SENSITIVEThis is a modified FDA-approved test that has been validated and its performance characteristics determined by the reporting laboratory.  This laboratory is certified under the Clinical Laboratory Improvement Amendments CLIA as qualified to perform high complexity clinical laboratory testing.    TOBRAMYCIN Value in next row Sensitive      8 SENSITIVEThis is a modified FDA-approved test that has been validated and its performance characteristics determined by the reporting laboratory.  This laboratory is certified under the Clinical Laboratory Improvement Amendments CLIA as qualified to perform high complexity clinical laboratory testing.    CEFTAZIDIME Value in next row Sensitive      8 SENSITIVEThis is a modified FDA-approved test that has been validated and its performance characteristics determined by the reporting laboratory.  This laboratory is certified under the Clinical Laboratory Improvement Amendments CLIA as qualified to perform high complexity clinical laboratory testing.    * >=100,000 COLONIES/mL PSEUDOMONAS AERUGINOSA    [x]  Treated with cefuroxime , organism resistant to prescribed antimicrobial.  New antibiotic prescription: ciprofloxacin  500mg  PO BID x 5 days  ED Provider: Richerd Later DO  Rankin Dee, PharmD, BCPS, BCIDP Clinical Pharmacist 09/21/2024 8:07 AM

## 2024-09-22 DIAGNOSIS — R32 Unspecified urinary incontinence: Secondary | ICD-10-CM | POA: Insufficient documentation

## 2024-09-22 DIAGNOSIS — D849 Immunodeficiency, unspecified: Secondary | ICD-10-CM | POA: Insufficient documentation

## 2024-09-22 DIAGNOSIS — B965 Pseudomonas (aeruginosa) (mallei) (pseudomallei) as the cause of diseases classified elsewhere: Secondary | ICD-10-CM | POA: Insufficient documentation

## 2024-09-22 NOTE — Assessment & Plan Note (Addendum)
 Suspect viral. Lungs clear, good o2 sats, afebrile.  Received zpack earlier this month for strep throat, and now currently transitioning from Ceftin  course to Cipro  5d course to treat UTI.  Supportive measures reviewed  He is immunocompromised. Close monitoring indicated - update if new or worsening symptoms.

## 2024-09-22 NOTE — Assessment & Plan Note (Addendum)
 Ongoing nocturnal accidents - soaking through pad and depends.  They ask about PureWick system - reviewing website, this is not covered by insurance. Recommend they touch base with urology on their recommendations first at upcoming appt next week

## 2024-09-22 NOTE — Assessment & Plan Note (Signed)
 Currently on cycle 3/4 chemotherapy for lung cancer.

## 2024-09-22 NOTE — Assessment & Plan Note (Addendum)
 Recent ER visit for UTI presenting with AMS.  Head imaging, labs otherwise overall reassuring. UCx grew >100k pseudomonas.  Plan is 5 days of ciprofloxacin  500mg  bid.  Update if persistent or worsening symptoms.

## 2024-09-22 NOTE — Assessment & Plan Note (Signed)
 Continues lung cancer treatment with Cisplatin  + Docetaxel  chemotherapy q3 wks, latest infusion earlier this week.

## 2024-09-22 NOTE — Assessment & Plan Note (Addendum)
 Largely improved, however with some persistence to right posterior soft palate  He has been on several antibiotics in the interim. Also reviewed timing nystatin  with oral hygiene measures  Will refill nystatin  swish/swallow solution . Pt largely asymptomatic.

## 2024-09-25 ENCOUNTER — Inpatient Hospital Stay (HOSPITAL_BASED_OUTPATIENT_CLINIC_OR_DEPARTMENT_OTHER): Admitting: Internal Medicine

## 2024-09-25 VITALS — BP 126/77 | HR 73 | Temp 97.7°F | Resp 17 | Ht 71.0 in | Wt 190.0 lb

## 2024-09-25 DIAGNOSIS — C3490 Malignant neoplasm of unspecified part of unspecified bronchus or lung: Secondary | ICD-10-CM

## 2024-09-25 DIAGNOSIS — C3411 Malignant neoplasm of upper lobe, right bronchus or lung: Secondary | ICD-10-CM | POA: Diagnosis not present

## 2024-09-25 NOTE — Progress Notes (Signed)
 "     [facility] Telephone:(336) [phone]   Fax:(336) [phone]  OFFICE PROGRESS NOTE  Wendee Lynwood HERO, NP [address] [place] [address]  DIAGNOSIS: Stage IIA (T1c, N1, M0) Squamous cell Carcinoma diagnosed in January of 2025. Diagnosed with NSCLC, squamous cell carcinoma, stage IIB due to lymph node involvement and tumor size (2.9 cm).  PD-L1 TPS was 0%  PRIOR THERAPY:  1) Status post left upper lobectomy with lymph node dissection on October 03, 2023.  2) Systemic chemotherapy with cisplatin  75 Mg/M2 and docetaxel  75 Mg/M2 with Neulasta  support every 3 weeks.  First dose November 16, 2023.  Status post 4 cycles  CURRENT THERAPY: Observation.  INTERVAL HISTORY: Cameron [name] 73 y.o. male returns to the clinic today for follow-up visit accompanied by his wife. Discussed the use of AI scribe software for clinical note transcription with the patient, who gave verbal consent to proceed.  History of Present Illness Cameron [name] is a 73 year old male with stage 2A non-small cell lung cancer, status post right upper lobectomy and adjuvant chemotherapy, who presents for evaluation after repeat chest CT for restaging.  Diagnosed with stage 2A non-small cell lung cancer (squamous cell carcinoma) in January 2025, he underwent right upper lobectomy with lymph node dissection followed by four cycles of adjuvant cisplatin  and docetaxel . He is currently in the observation phase. He presents today following a repeat chest CT performed last week for restaging. He reports feeling well overall and denies new symptoms related to his malignancy, including no nausea, vomiting, or diarrhea.  He was hospitalized earlier this week for a urinary tract infection, which initially raised concern for possible stroke due to his presenting symptoms. CT head and MRI brain were negative for acute pathology, and he has since recovered from the infection.  Over the past week, he has developed new  onset bruxism and repetitive chin movements, which his wife notes have been intermittently present since chemotherapy but have recently worsened, possibly in association with his recent illness. These movements occur regardless of denture or partial use.  He expresses significant fatigue related to the frequency of medical appointments over the past year.    MEDICAL HISTORY: Past Medical History:  Diagnosis Date   AAA (abdominal aortic aneurysm) 01/2024   4.4 cm   Adenomatous colon polyp 05/2009; 11/2014   2010:Tubular adenoma, no high grade dysplasia.SABRA  + Hyperplastic polyps. 2016: Tubular adenoma-rpt 5 yrs.   Cancer (HCC)    TURP for prostate cancer   Complication of anesthesia    COPD (chronic obstructive pulmonary disease) (HCC)    Coronary artery disease    Difficult intubation    Dyspnea    H/O chronic gastritis 05/2009   EGD: no h.pylori,dysplasia,or evidence of malignancy   Hyperlipidemia    lovaza from prior PMD-stopped due to cost   Hypertension    Obesity, Class I, BMI 30.0-34.9 (see actual BMI) 04/23/2014   Pre-diabetes    Right inguinal hernia    Tobacco dependence    Ventral hernia    small    ALLERGIES:  has no known allergies.  MEDICATIONS:  Current Outpatient Medications  Medication Sig Dispense Refill   atorvastatin  (LIPITOR) 20 MG tablet Take 1 tablet (20 mg total) by mouth daily. 90 tablet 1   Bioflavonoid Products (SUPER-C 1000 PO) Take 1 tablet by mouth in the morning.     fluticasone  (FLONASE ) 50 MCG/ACT nasal spray Place 1 spray into both nostrils 2 (two) times  daily as needed for allergies or rhinitis.     fluticasone -salmeterol (ADVAIR  DISKUS) 250-50 MCG/ACT AEPB Inhale 1 puff into the lungs in the morning and at bedtime. 180 each 1   gabapentin  (NEURONTIN ) 300 MG capsule Take 1 capsule (300 mg total) by mouth 2 (two) times daily. 180 capsule 1   hydrochlorothiazide  (MICROZIDE ) 12.5 MG capsule Take 1 capsule (12.5 mg total) by mouth daily. 90 capsule  1   lidocaine -prilocaine  (EMLA ) cream Apply to the Port-A-Cath site 30-60 minutes before chemotherapy 30 g 0   Multiple Vitamin (MULTIVITAMIN WITH MINERALS) TABS tablet Take 1 tablet by mouth daily.     valACYclovir  (VALTREX ) 1000 MG tablet Take 1 tablet (1,000 mg total) by mouth 2 (two) times daily for 3 days. Start within 72 hours of the first signs of a flare. (Patient taking differently: Take 500 mg by mouth daily as needed (outset of flare.).) 18 tablet 2   ciprofloxacin  (CIPRO ) 500 MG tablet Take 1 tablet (500 mg total) by mouth 2 (two) times daily for 5 days. (Patient not taking: Reported on 09/21/2024) 10 tablet 0   ibuprofen  (ADVIL ) 800 MG tablet Take 1 tablet (800 mg total) by mouth every 8 (eight) hours as needed. (Patient not taking: Reported on 09/21/2024) 30 tablet 0   nystatin  (MYCOSTATIN ) 100000 UNIT/ML suspension Take 5 mLs (500,000 Units total) by mouth 3 (three) times daily. 120 mL 0   No current facility-administered medications for this visit.    SURGICAL HISTORY:  Past Surgical History:  Procedure Laterality Date   APPENDECTOMY  09/28/1971   BIOPSY  08/15/2023   Procedure: BIOPSY;  Surgeon: Brenna Adine CROME, DO;  Location: [facility];  Service: Pulmonary;;   BLADDER DIVERTICULECTOMY N/A 09/30/2022   Procedure: BLADDER DIVERTICULECTOMY;  Surgeon: Renda Glance, MD;  Location: [facility];  Service: Urology;  Laterality: N/A;   CARPAL TUNNEL RELEASE Left    CHOLECYSTECTOMY N/A 07/06/2018   Procedure: LAPAROSCOPIC CHOLECYSTECTOMY WITH INTRAOPERATIVE CHOLANGIOGRAM;  Surgeon: Ethyl Lenis, MD;  Location: [facility] OR;  Service: General;  Laterality: N/A;   COLONOSCOPY  09/27/2008   [facility]   CYSTOSCOPY N/A 09/30/2022   Procedure: PHYLLIS SIDE;  Surgeon: Renda Glance, MD;  Location: [facility];  Service: Urology;  Laterality: N/A;   EYE SURGERY  2018   Cataracts removed only   FINGER SURGERY Left 12/2021   Left middle finger, laceration   HEMOSTASIS CONTROL  08/15/2023    Procedure: HEMOSTASIS CONTROL;  Surgeon: Brenna Adine CROME, DO;  Location: [facility];  Service: Pulmonary;;   HERNIORRHAPHY, INGUINAL, ROBOT-ASSISTED, LAPAROSCOPIC N/A 07/04/2024   Procedure: HERNIORRHAPHY, INGUINAL, ROBOT-ASSISTED, LAPAROSCOPIC;  Surgeon: Lane Shope, MD;  Location: [facility];  Service: General;  Laterality: N/A;   INCISIONAL HERNIA REPAIR N/A 07/04/2024   Procedure: REPAIR, HERNIA, INCISIONAL;  Surgeon: Lane Shope, MD;  Location: [facility];  Service: General;  Laterality: N/A;  possible open incisional hernia repair   INSERTION OF MESH  07/04/2024   Procedure: INSERTION OF MESH;  Surgeon: Lane Shope, MD;  Location: [facility];  Service: General;;   INTERCOSTAL NERVE BLOCK Left 10/03/2023   Procedure: INTERCOSTAL NERVE BLOCK;  Surgeon: Kerrin Elspeth BROCKS, MD;  Location: [facility] OR;  Service: Thoracic;  Laterality: Left;   IR IMAGING GUIDED PORT INSERTION  12/08/2023   left lobectomy upper Left 2025   LYMPH NODE BIOPSY Left 10/03/2023   Procedure: LYMPH NODE BIOPSY;  Surgeon: Kerrin Elspeth BROCKS, MD;  Location: [facility] OR;  Service: Thoracic;  Laterality: Left;   ROBOT ASSISTED  LAPAROSCOPIC RADICAL PROSTATECTOMY N/A 09/30/2022   Procedure: XI ROBOTIC ASSISTED LAPAROSCOPIC RADICAL PROSTATECTOMY LEVEL 3;  Surgeon: Renda Glance, MD;  Location: [facility];  Service: Urology;  Laterality: N/A;  270 MINUTES NEEDED FOR CASE   STRABISMUS SURGERY  age 89 or 9   TRANSURETHRAL RESECTION OF PROSTATE N/A 07/08/2022   Procedure: TRANSURETHRAL RESECTION OF THE PROSTATE (TURP)/ CYSTOSCOPY;  Surgeon: Renda Glance, MD;  Location: [facility];  Service: Urology;  Laterality: N/A;   VIDEO BRONCHOSCOPY N/A 08/15/2023   Procedure: VIDEO BRONCHOSCOPY WITHOUT FLUORO;  Surgeon: Brenna Adine CROME, DO;  Location: [facility];  Service: Pulmonary;  Laterality: N/A;    REVIEW OF SYSTEMS:  A comprehensive review of systems was negative except for: Constitutional: positive for fatigue   PHYSICAL  EXAMINATION: General appearance: alert, cooperative, and no distress Head: Normocephalic, without obvious abnormality, atraumatic Neck: no adenopathy, no JVD, supple, symmetrical, trachea midline, and thyroid  not enlarged, symmetric, no tenderness/mass/nodules Lymph nodes: Cervical, supraclavicular, and axillary nodes normal. Resp: clear to auscultation bilaterally Back: symmetric, no curvature. ROM normal. No CVA tenderness. Cardio: regular rate and rhythm, S1, S2 normal, no murmur, click, rub or gallop GI: soft, non-tender; bowel sounds normal; no masses,  no organomegaly Extremities: extremities normal, atraumatic, no cyanosis or edema  ECOG PERFORMANCE STATUS: 1 - Symptomatic but completely ambulatory  Blood pressure 126/77, pulse 73, temperature 97.7 F (36.5 C), temperature source Temporal, resp. rate 17, height 5' 11 (1.803 m), weight 190 lb (86.2 kg), SpO2 100%.  LABORATORY DATA: Lab Results  Component Value Date   WBC 6.5 09/17/2024   HGB 14.5 09/17/2024   HCT 43.1 09/17/2024   MCV 93.1 09/17/2024   PLT 203 09/17/2024      Chemistry      Component Value Date/Time   NA 136 09/17/2024 1524   K 4.3 09/17/2024 1524   CL 99 09/17/2024 1524   CO2 27 09/17/2024 1524   BUN 14 09/17/2024 1524   CREATININE 1.08 09/17/2024 1524   CREATININE 1.18 07/10/2024 0909      Component Value Date/Time   CALCIUM  9.5 09/17/2024 1524   ALKPHOS 98 09/17/2024 1524   AST 27 09/17/2024 1524   AST 13 (L) 07/10/2024 0909   ALT 19 09/17/2024 1524   ALT 11 07/10/2024 0909   BILITOT 0.3 09/17/2024 1524   BILITOT 0.5 07/10/2024 0909       RADIOGRAPHIC STUDIES: MR BRAIN WO CONTRAST Result Date: 09/17/2024 EXAM: MRI BRAIN WITHOUT CONTRAST 09/17/2024 05:55:37 PM TECHNIQUE: Multiplanar multisequence MRI of the head/brain was performed without the administration of intravenous contrast. COMPARISON: MRI brain 02/18/2024. CLINICAL HISTORY: Neuro deficit, acute, stroke suspected. FINDINGS: BRAIN  AND VENTRICLES: No acute infarct. No intracranial hemorrhage. No mass. No midline shift. No hydrocephalus. Unchanged encephalomalacia in the medial occipital lobes, right greater than left, compatible with old infarcts. Stable background of mild chronic small vessel disease. The sella is unremarkable. Normal flow voids. ORBITS: No acute abnormality. SINUSES AND MASTOIDS: No acute abnormality. BONES AND SOFT TISSUES: Normal marrow signal. No acute soft tissue abnormality. IMPRESSION: 1. No acute intracranial abnormality. Electronically signed by: Ryan Chess MD 09/17/2024 06:06 PM EST RP Workstation: [id number]   CT ANGIO HEAD NECK W WO CM Result Date: 09/17/2024 EXAM: CTA HEAD AND NECK WITHOUT AND WITH 09/17/2024 05:42:03 PM TECHNIQUE: CTA of the head and neck was performed without and with the administration of 75 mL of intravenous contrast (iohexol  (OMNIPAQUE ) 350 MG/ML injection 75 mL IOHEXOL  350 MG/ML SOLN). Multiplanar 2D and/or 3D reformatted  images are provided for review. Automated exposure control, iterative reconstruction, and/or weight based adjustment of the mA/kV was utilized to reduce the radiation dose to as low as reasonably achievable. Stenosis of the internal carotid arteries measured using NASCET criteria. COMPARISON: CT head 09/17/2024 CLINICAL HISTORY: Neuro deficit, acute, stroke suspected. FINDINGS: CTA NECK: AORTIC ARCH AND ARCH VESSELS: Atherosclerotic calcifications of the aortic arch and arch vessel origins, which remain patent. No dissection or arterial injury. No significant stenosis of the brachiocephalic or subclavian arteries. CERVICAL CAROTID ARTERIES: Mixed plaque along the left carotid bulb and proximal left cervical ICA without hemodynamically significant stenosis. Mild mixed plaque along the right carotid bulb and proximal right cervical ICA without hemodynamically significant stenosis. No dissection or arterial injury. CERVICAL VERTEBRAL ARTERIES: No dissection, arterial  injury, or significant stenosis. LUNGS AND MEDIASTINUM: Moderate centrilobular emphysema in the right lung apex. Mediastinum is unremarkable. SOFT TISSUES: No acute abnormality. BONES: Multilevel degenerative disc disease of the cervical spine without high grade spinal canal stenosis. No suspicious bone lesion. CTA HEAD: ANTERIOR CIRCULATION: Atherosclerotic calcifications of the ICA siphons without significant stenosis or aneurysm. No significant stenosis of the anterior cerebral arteries. No significant stenosis of the middle cerebral arteries. POSTERIOR CIRCULATION: No significant stenosis of the posterior cerebral arteries. No significant stenosis of the basilar artery. No significant stenosis of the vertebral arteries. No aneurysm. OTHER: No dural venous sinus thrombosis on this non-dedicated study. IMPRESSION: 1. No large vessel occlusion, hemodynamically significant stenosis, or aneurysm in the head or neck. 2. Aortic Atherosclerosis (ICD10-I70.0). 3. Emphysema (ICD10-J43.9). Electronically signed by: Ryan Chess MD 09/17/2024 06:04 PM EST RP Workstation: [id number]   CT HEAD WO CONTRAST Result Date: 09/17/2024 EXAM: CT HEAD WITHOUT CONTRAST 09/17/2024 03:20:00 PM TECHNIQUE: CT of the head was performed without the administration of intravenous contrast. Automated exposure control, iterative reconstruction, and/or weight based adjustment of the mA/kV was utilized to reduce the radiation dose to as low as reasonably achievable. COMPARISON: CT head 02/18/2024 and MRI brain 02/18/2024. CLINICAL HISTORY: Neuro deficit, acute, stroke suspected. FINDINGS: BRAIN AND VENTRICLES: No acute hemorrhage. No evidence of acute territorial cerebral infarct. Question of asymmetric hypoattenuation of the right cerebellar hemisphere, though evaluation of the posterior fossa is limited secondary to streak artifact CT technique. No hydrocephalus. No extra-axial collection. No mass effect or midline shift. Overall similar  mild scattered white matter hypodensities which are nonspecific but most commonly represent chronic microvascular ischemic changes. Redemonstrated small chronic bilateral parieto-occipital infarctions. ORBITS: Bilateral lens replacement. SINUSES: Retention cysts left maxillary sinus. Scattered mucosal thickening bilateral ethmoid air cells. SOFT TISSUES AND SKULL: No acute soft tissue abnormality. No skull fracture. IMPRESSION: 1. Asymmetric hypoattenuation of the right cerebellar hemisphere could represent recent ischemic infarction however CT evaluation of the posterior fossa is limited secondary to streak artifact. MRI brain would be useful to further assess as clinically warranted 2. No acute intracranial hemorrhage or evidence of acute territorial cerebral infarction. Electronically signed by: Prentice Spade MD 09/17/2024 04:06 PM EST RP Workstation: [id number]   DG Chest 2 View Result Date: 08/28/2024 CLINICAL DATA:  Left lower lobe rales. History of left upper lobectomy for lung cancer. Congestion. EXAM: CHEST - 2 VIEW COMPARISON:  Radiograph 02/18/2024, CT 05/23/2024 FINDINGS: Right chest port with tip in the SVC. Chronic postsurgical volume loss in the left hemithorax. Chronic blunting of left costophrenic angle and scarring at the left lung base. No acute airspace disease. Normal heart size with stable mediastinal contours. Aortic atherosclerosis. No pulmonary edema. No pneumothorax. On limited  assessment, no acute osseous findings. IMPRESSION: Chronic postsurgical volume loss in the left hemithorax with scarring at the left lung base. No acute findings. Electronically Signed   By: Andrea Gasman M.D.   On: 08/28/2024 16:44     ASSESSMENT AND PLAN: This is a very pleasant 73 years old white male with Stage IIA (T1c, N1, M0) Squamous cell Carcinoma diagnosed in January of 2025. Diagnosed with NSCLC, squamous cell carcinoma, stage IIB due to lymph node involvement and tumor size (2.9 cm). Status  post left upper lobectomy with lymph node dissection on October 03, 2023.  He has negative PD-L1 expression. The patient underwent adjuvant systemic chemotherapy with cisplatin  75 Mg/M2 and docetaxel  75 Mg/M2 with Neulasta  support status post 4 cycles.  He is currently on observation.   He had repeat CT scan of the chest performed recently.  The final report is still pending but I personally and independently reviewed the scan images in comparison to the previous CT of the chest and I do not see any concerning findings for disease recurrence or metastasis but I will wait for the final report for confirmation. Assessment and Plan Assessment & Plan Stage IIa non-small cell lung cancer, right upper lobe, status post resection and adjuvant chemotherapy He is in the observation phase following right upper lobectomy and adjuvant cisplatin /docetaxel . Preliminary review of the recent chest CT for restaging showed no new or concerning findings compared to prior imaging. No evidence of recurrence or progression at this time; official radiology report is pending. - Reviewed recent chest CT; preliminary review showed no concerning findings. - Will await official radiology report for chest CT. - Plan to follow up in six months if the radiology report is consistent with preliminary findings. He was advised to call immediately if he has any other concerning symptoms in the interval.  The patient voices understanding of current disease status and treatment options and is in agreement with the current care plan.  All questions were answered. The patient knows to call the clinic with any problems, questions or concerns. We can certainly see the patient much sooner if necessary. The total time spent in the appointment was 20 minutes including review of chart and various tests results, discussions about plan of care and coordination of care plan .   Disclaimer: This note was dictated with voice recognition software.  Similar sounding words can inadvertently be transcribed and may not be corrected upon review.        "

## 2024-09-26 ENCOUNTER — Other Ambulatory Visit: Payer: Self-pay | Admitting: Nurse Practitioner

## 2024-09-26 ENCOUNTER — Other Ambulatory Visit (HOSPITAL_COMMUNITY): Payer: Self-pay

## 2024-09-26 DIAGNOSIS — I10 Essential (primary) hypertension: Secondary | ICD-10-CM

## 2024-09-28 ENCOUNTER — Other Ambulatory Visit (HOSPITAL_COMMUNITY): Payer: Self-pay

## 2024-09-28 MED ORDER — HYDROCHLOROTHIAZIDE 12.5 MG PO CAPS
12.5000 mg | ORAL_CAPSULE | Freq: Every day | ORAL | 1 refills | Status: AC
  Filled 2024-09-28: qty 90, 90d supply, fill #0

## 2024-10-01 ENCOUNTER — Other Ambulatory Visit (HOSPITAL_COMMUNITY): Payer: Self-pay

## 2024-10-03 ENCOUNTER — Other Ambulatory Visit (HOSPITAL_COMMUNITY): Payer: Self-pay

## 2024-11-21 ENCOUNTER — Ambulatory Visit: Payer: HMO

## 2024-12-18 ENCOUNTER — Ambulatory Visit: Admitting: Nurse Practitioner
